# Patient Record
Sex: Female | Born: 1940 | Race: White | Hispanic: No | Smoking: Former smoker
Health system: Southern US, Community
[De-identification: ages and names within clinical notes are randomized; demographics above are authoritative.]

## PROBLEM LIST (undated history)

## (undated) DIAGNOSIS — M1990 Unspecified osteoarthritis, unspecified site: Secondary | ICD-10-CM

## (undated) DIAGNOSIS — F419 Anxiety disorder, unspecified: Secondary | ICD-10-CM

## (undated) DIAGNOSIS — H6990 Unspecified Eustachian tube disorder, unspecified ear: Secondary | ICD-10-CM

## (undated) DIAGNOSIS — F329 Major depressive disorder, single episode, unspecified: Secondary | ICD-10-CM

## (undated) DIAGNOSIS — D649 Anemia, unspecified: Secondary | ICD-10-CM

## (undated) DIAGNOSIS — Z9289 Personal history of other medical treatment: Secondary | ICD-10-CM

## (undated) DIAGNOSIS — R7309 Other abnormal glucose: Secondary | ICD-10-CM

## (undated) DIAGNOSIS — E039 Hypothyroidism, unspecified: Secondary | ICD-10-CM

## (undated) DIAGNOSIS — T7840XA Allergy, unspecified, initial encounter: Secondary | ICD-10-CM

## (undated) DIAGNOSIS — E785 Hyperlipidemia, unspecified: Secondary | ICD-10-CM

## (undated) DIAGNOSIS — Z860100 Personal history of colon polyps, unspecified: Secondary | ICD-10-CM

## (undated) DIAGNOSIS — H6980 Other specified disorders of Eustachian tube, unspecified ear: Secondary | ICD-10-CM

## (undated) DIAGNOSIS — F32A Depression, unspecified: Secondary | ICD-10-CM

## (undated) DIAGNOSIS — C801 Malignant (primary) neoplasm, unspecified: Secondary | ICD-10-CM

## (undated) DIAGNOSIS — I10 Essential (primary) hypertension: Secondary | ICD-10-CM

## (undated) DIAGNOSIS — K219 Gastro-esophageal reflux disease without esophagitis: Secondary | ICD-10-CM

## (undated) HISTORY — DX: Allergy, unspecified, initial encounter: T78.40XA

## (undated) HISTORY — DX: Unspecified eustachian tube disorder, unspecified ear: H69.90

## (undated) HISTORY — DX: Personal history of colonic polyps: Z86.010

## (undated) HISTORY — DX: Gastro-esophageal reflux disease without esophagitis: K21.9

## (undated) HISTORY — PX: APPENDECTOMY: SHX54

## (undated) HISTORY — DX: Personal history of other medical treatment: Z92.89

## (undated) HISTORY — PX: COLONOSCOPY W/ POLYPECTOMY: SHX1380

## (undated) HISTORY — PX: SINUS SURGERY WITH INSTATRAK: SHX5215

## (undated) HISTORY — PX: ABDOMINAL HYSTERECTOMY: SHX81

## (undated) HISTORY — DX: Other specified disorders of Eustachian tube, unspecified ear: H69.80

## (undated) HISTORY — PX: CHOLECYSTECTOMY: SHX55

## (undated) HISTORY — DX: Essential (primary) hypertension: I10

## (undated) HISTORY — PX: CATARACT EXTRACTION, BILATERAL: SHX1313

## (undated) HISTORY — DX: Anemia, unspecified: D64.9

## (undated) HISTORY — DX: Other abnormal glucose: R73.09

## (undated) HISTORY — PX: TUBAL LIGATION: SHX77

## (undated) HISTORY — PX: EXPLORATORY LAPAROTOMY: SUR591

## (undated) HISTORY — DX: Personal history of colon polyps, unspecified: Z86.0100

## (undated) HISTORY — PX: TOTAL SHOULDER REPLACEMENT: SUR1217

## (undated) HISTORY — DX: Hyperlipidemia, unspecified: E78.5

---

## 1997-12-06 ENCOUNTER — Ambulatory Visit (HOSPITAL_COMMUNITY): Admission: RE | Admit: 1997-12-06 | Discharge: 1997-12-06 | Payer: Self-pay | Admitting: Gastroenterology

## 1997-12-08 ENCOUNTER — Inpatient Hospital Stay (HOSPITAL_COMMUNITY): Admission: EM | Admit: 1997-12-08 | Discharge: 1997-12-16 | Payer: Self-pay | Admitting: Emergency Medicine

## 1999-08-02 ENCOUNTER — Encounter: Payer: Self-pay | Admitting: Cardiology

## 1999-08-27 ENCOUNTER — Ambulatory Visit (HOSPITAL_COMMUNITY): Admission: RE | Admit: 1999-08-27 | Discharge: 1999-08-27 | Payer: Self-pay | Admitting: Gastroenterology

## 1999-08-27 ENCOUNTER — Encounter: Payer: Self-pay | Admitting: Gastroenterology

## 1999-09-04 ENCOUNTER — Ambulatory Visit (HOSPITAL_COMMUNITY): Admission: RE | Admit: 1999-09-04 | Discharge: 1999-09-04 | Payer: Self-pay | Admitting: Gastroenterology

## 1999-09-04 ENCOUNTER — Encounter: Payer: Self-pay | Admitting: Gastroenterology

## 2000-02-04 ENCOUNTER — Ambulatory Visit (HOSPITAL_COMMUNITY): Admission: RE | Admit: 2000-02-04 | Discharge: 2000-02-04 | Payer: Self-pay | Admitting: Internal Medicine

## 2000-02-04 ENCOUNTER — Encounter: Payer: Self-pay | Admitting: Internal Medicine

## 2000-02-14 ENCOUNTER — Encounter: Admission: RE | Admit: 2000-02-14 | Discharge: 2000-05-14 | Payer: Self-pay

## 2000-07-17 ENCOUNTER — Ambulatory Visit (HOSPITAL_BASED_OUTPATIENT_CLINIC_OR_DEPARTMENT_OTHER): Admission: RE | Admit: 2000-07-17 | Discharge: 2000-07-17 | Payer: Self-pay | Admitting: Orthopedic Surgery

## 2000-11-27 ENCOUNTER — Encounter (INDEPENDENT_AMBULATORY_CARE_PROVIDER_SITE_OTHER): Payer: Self-pay | Admitting: Specialist

## 2000-11-27 ENCOUNTER — Other Ambulatory Visit: Admission: RE | Admit: 2000-11-27 | Discharge: 2000-11-27 | Payer: Self-pay | Admitting: Gastroenterology

## 2001-04-28 ENCOUNTER — Ambulatory Visit (HOSPITAL_BASED_OUTPATIENT_CLINIC_OR_DEPARTMENT_OTHER): Admission: RE | Admit: 2001-04-28 | Discharge: 2001-04-28 | Payer: Self-pay | Admitting: Orthopedic Surgery

## 2001-04-28 ENCOUNTER — Encounter (INDEPENDENT_AMBULATORY_CARE_PROVIDER_SITE_OTHER): Payer: Self-pay | Admitting: *Deleted

## 2001-08-18 ENCOUNTER — Encounter: Payer: Self-pay | Admitting: Sports Medicine

## 2001-08-18 ENCOUNTER — Encounter: Admission: RE | Admit: 2001-08-18 | Discharge: 2001-08-18 | Payer: Self-pay | Admitting: Sports Medicine

## 2005-03-10 ENCOUNTER — Ambulatory Visit: Payer: Self-pay | Admitting: Family Medicine

## 2005-06-12 ENCOUNTER — Ambulatory Visit: Payer: Self-pay | Admitting: Internal Medicine

## 2005-06-16 ENCOUNTER — Ambulatory Visit: Payer: Self-pay | Admitting: Cardiology

## 2006-05-20 ENCOUNTER — Ambulatory Visit: Payer: Self-pay | Admitting: Internal Medicine

## 2006-06-29 ENCOUNTER — Ambulatory Visit: Payer: Self-pay | Admitting: Internal Medicine

## 2006-06-29 LAB — CONVERTED CEMR LAB
ALT: 20 units/L (ref 0–40)
AST: 20 units/L (ref 0–37)
Basophils Relative: 0.7 % (ref 0.0–1.0)
HCT: 39.8 % (ref 36.0–46.0)
Lipase: 28 units/L (ref 11.0–59.0)
Lymphocytes Relative: 23.4 % (ref 12.0–46.0)
MCHC: 34.2 g/dL (ref 30.0–36.0)
MCV: 90 fL (ref 78.0–100.0)
RBC: 4.42 M/uL (ref 3.87–5.11)

## 2008-07-27 ENCOUNTER — Encounter: Payer: Self-pay | Admitting: Internal Medicine

## 2008-08-03 ENCOUNTER — Encounter: Payer: Self-pay | Admitting: Internal Medicine

## 2008-08-22 ENCOUNTER — Encounter: Payer: Self-pay | Admitting: Internal Medicine

## 2008-11-13 ENCOUNTER — Encounter: Payer: Self-pay | Admitting: Internal Medicine

## 2009-03-14 ENCOUNTER — Encounter: Payer: Self-pay | Admitting: Internal Medicine

## 2009-03-23 ENCOUNTER — Encounter: Payer: Self-pay | Admitting: Internal Medicine

## 2009-03-27 ENCOUNTER — Encounter: Payer: Self-pay | Admitting: Internal Medicine

## 2009-07-05 ENCOUNTER — Encounter: Payer: Self-pay | Admitting: Cardiology

## 2009-08-28 ENCOUNTER — Encounter: Payer: Self-pay | Admitting: Internal Medicine

## 2009-10-03 ENCOUNTER — Encounter: Payer: Self-pay | Admitting: Internal Medicine

## 2009-10-09 ENCOUNTER — Encounter: Payer: Self-pay | Admitting: Internal Medicine

## 2009-10-09 ENCOUNTER — Encounter: Payer: Self-pay | Admitting: Cardiology

## 2009-10-23 ENCOUNTER — Encounter: Admission: RE | Admit: 2009-10-23 | Discharge: 2009-10-23 | Payer: Self-pay | Admitting: Orthopedic Surgery

## 2009-10-29 ENCOUNTER — Ambulatory Visit: Payer: Self-pay | Admitting: Internal Medicine

## 2009-10-29 DIAGNOSIS — Z860100 Personal history of colon polyps, unspecified: Secondary | ICD-10-CM | POA: Insufficient documentation

## 2009-10-29 DIAGNOSIS — K219 Gastro-esophageal reflux disease without esophagitis: Secondary | ICD-10-CM | POA: Insufficient documentation

## 2009-10-29 DIAGNOSIS — Z87898 Personal history of other specified conditions: Secondary | ICD-10-CM

## 2009-10-29 DIAGNOSIS — I10 Essential (primary) hypertension: Secondary | ICD-10-CM

## 2009-10-29 DIAGNOSIS — Z8719 Personal history of other diseases of the digestive system: Secondary | ICD-10-CM | POA: Insufficient documentation

## 2009-10-29 DIAGNOSIS — D509 Iron deficiency anemia, unspecified: Secondary | ICD-10-CM | POA: Insufficient documentation

## 2009-11-16 ENCOUNTER — Encounter: Admission: RE | Admit: 2009-11-16 | Discharge: 2009-11-16 | Payer: Self-pay | Admitting: Internal Medicine

## 2009-11-21 ENCOUNTER — Encounter: Payer: Self-pay | Admitting: Cardiology

## 2009-11-29 ENCOUNTER — Encounter: Payer: Self-pay | Admitting: Cardiology

## 2009-12-21 ENCOUNTER — Encounter: Payer: Self-pay | Admitting: Cardiology

## 2010-01-16 ENCOUNTER — Ambulatory Visit: Payer: Self-pay | Admitting: Internal Medicine

## 2010-01-16 DIAGNOSIS — R0989 Other specified symptoms and signs involving the circulatory and respiratory systems: Secondary | ICD-10-CM

## 2010-01-16 DIAGNOSIS — R0609 Other forms of dyspnea: Secondary | ICD-10-CM | POA: Insufficient documentation

## 2010-01-17 ENCOUNTER — Telehealth (INDEPENDENT_AMBULATORY_CARE_PROVIDER_SITE_OTHER): Payer: Self-pay | Admitting: *Deleted

## 2010-01-17 ENCOUNTER — Encounter: Admission: RE | Admit: 2010-01-17 | Discharge: 2010-01-17 | Payer: Self-pay | Admitting: Orthopedic Surgery

## 2010-01-31 ENCOUNTER — Ambulatory Visit: Payer: Self-pay | Admitting: Internal Medicine

## 2010-02-11 ENCOUNTER — Telehealth (INDEPENDENT_AMBULATORY_CARE_PROVIDER_SITE_OTHER): Payer: Self-pay | Admitting: *Deleted

## 2010-02-12 ENCOUNTER — Encounter: Payer: Self-pay | Admitting: Cardiology

## 2010-02-13 ENCOUNTER — Telehealth: Payer: Self-pay | Admitting: Internal Medicine

## 2010-02-13 ENCOUNTER — Ambulatory Visit: Payer: Self-pay | Admitting: Critical Care Medicine

## 2010-02-15 ENCOUNTER — Encounter: Payer: Self-pay | Admitting: Internal Medicine

## 2010-03-05 ENCOUNTER — Telehealth (INDEPENDENT_AMBULATORY_CARE_PROVIDER_SITE_OTHER): Payer: Self-pay | Admitting: *Deleted

## 2010-03-08 ENCOUNTER — Ambulatory Visit: Payer: Self-pay | Admitting: Internal Medicine

## 2010-03-14 ENCOUNTER — Ambulatory Visit: Payer: Self-pay | Admitting: Internal Medicine

## 2010-03-14 DIAGNOSIS — J3089 Other allergic rhinitis: Secondary | ICD-10-CM

## 2010-03-14 DIAGNOSIS — J302 Other seasonal allergic rhinitis: Secondary | ICD-10-CM

## 2010-03-15 LAB — CONVERTED CEMR LAB
Basophils Relative: 0.5 % (ref 0.0–3.0)
Eosinophils Relative: 1.2 % (ref 0.0–5.0)
Hemoglobin: 13.2 g/dL (ref 12.0–15.0)
Monocytes Absolute: 0.8 10*3/uL (ref 0.1–1.0)
Neutro Abs: 5.6 10*3/uL (ref 1.4–7.7)
Platelets: 312 10*3/uL (ref 150.0–400.0)
RDW: 12.4 % (ref 11.5–14.6)

## 2010-04-15 ENCOUNTER — Encounter: Payer: Self-pay | Admitting: Internal Medicine

## 2010-04-15 ENCOUNTER — Telehealth (INDEPENDENT_AMBULATORY_CARE_PROVIDER_SITE_OTHER): Payer: Self-pay | Admitting: *Deleted

## 2010-04-19 ENCOUNTER — Telehealth (INDEPENDENT_AMBULATORY_CARE_PROVIDER_SITE_OTHER): Payer: Self-pay | Admitting: *Deleted

## 2010-05-06 ENCOUNTER — Ambulatory Visit: Payer: Self-pay | Admitting: Internal Medicine

## 2010-05-06 DIAGNOSIS — E785 Hyperlipidemia, unspecified: Secondary | ICD-10-CM | POA: Insufficient documentation

## 2010-05-06 DIAGNOSIS — R5383 Other fatigue: Secondary | ICD-10-CM

## 2010-05-06 DIAGNOSIS — E039 Hypothyroidism, unspecified: Secondary | ICD-10-CM | POA: Insufficient documentation

## 2010-05-06 DIAGNOSIS — R5381 Other malaise: Secondary | ICD-10-CM

## 2010-05-06 DIAGNOSIS — M19019 Primary osteoarthritis, unspecified shoulder: Secondary | ICD-10-CM | POA: Insufficient documentation

## 2010-05-13 ENCOUNTER — Telehealth: Payer: Self-pay | Admitting: Internal Medicine

## 2010-05-30 ENCOUNTER — Ambulatory Visit: Payer: Self-pay | Admitting: Internal Medicine

## 2010-05-30 HISTORY — PX: TOTAL SHOULDER REPLACEMENT: SUR1217

## 2010-05-31 ENCOUNTER — Ambulatory Visit: Payer: Self-pay | Admitting: Cardiology

## 2010-05-31 ENCOUNTER — Encounter: Payer: Self-pay | Admitting: Internal Medicine

## 2010-06-03 ENCOUNTER — Ambulatory Visit: Payer: Self-pay | Admitting: Internal Medicine

## 2010-06-03 DIAGNOSIS — M25519 Pain in unspecified shoulder: Secondary | ICD-10-CM

## 2010-06-03 DIAGNOSIS — R079 Chest pain, unspecified: Secondary | ICD-10-CM | POA: Insufficient documentation

## 2010-06-04 ENCOUNTER — Telehealth: Payer: Self-pay | Admitting: Cardiology

## 2010-06-05 ENCOUNTER — Inpatient Hospital Stay (HOSPITAL_COMMUNITY)
Admission: RE | Admit: 2010-06-05 | Discharge: 2010-06-06 | Payer: Self-pay | Source: Home / Self Care | Attending: Orthopedic Surgery | Admitting: Orthopedic Surgery

## 2010-06-17 ENCOUNTER — Telehealth (INDEPENDENT_AMBULATORY_CARE_PROVIDER_SITE_OTHER): Payer: Self-pay | Admitting: *Deleted

## 2010-07-05 ENCOUNTER — Telehealth (INDEPENDENT_AMBULATORY_CARE_PROVIDER_SITE_OTHER): Payer: Self-pay | Admitting: *Deleted

## 2010-07-19 ENCOUNTER — Other Ambulatory Visit: Payer: Self-pay | Admitting: Dermatology

## 2010-07-23 ENCOUNTER — Other Ambulatory Visit: Payer: Self-pay | Admitting: Internal Medicine

## 2010-07-23 ENCOUNTER — Ambulatory Visit
Admission: RE | Admit: 2010-07-23 | Discharge: 2010-07-23 | Payer: Self-pay | Source: Home / Self Care | Attending: Internal Medicine | Admitting: Internal Medicine

## 2010-07-28 LAB — CONVERTED CEMR LAB
Basophils Relative: 1.1 % (ref 0.0–3.0)
Eosinophils Absolute: 0.3 10*3/uL (ref 0.0–0.7)
Eosinophils Relative: 4 % (ref 0.0–5.0)
Hemoglobin: 12.9 g/dL (ref 12.0–15.0)
Lymphocytes Relative: 23.6 % (ref 12.0–46.0)
Lymphs Abs: 1.5 10*3/uL (ref 0.7–4.0)
MCV: 93.2 fL (ref 78.0–100.0)
RBC: 4.04 M/uL (ref 3.87–5.11)
RDW: 13.7 % (ref 11.5–14.6)

## 2010-08-01 NOTE — Progress Notes (Signed)
Summary: clearance order today if not surgery will be cancel 12-7   Phone Note From Other Clinic   Caller: [facility] office [phone]- [phone] head page Request: Talk with Nurse Details of Complaint: need clearance order today if not surgery will be cancel  for 12-7.  fax # [phone]. Initial call taken by: Lorne Skeens,  June 04, 2010 8:50 AM  Follow-up for Phone Call        Dr Antoine Poche saw pt 12/5 and cleared her note faxed, Cordelia Pen is aware Meredith Staggers, RN  June 04, 2010 9:04 AM

## 2010-08-01 NOTE — Letter (Signed)
Summary: [facility] Progress Note   [facility] Progress Note   Imported By: Roderic Ovens 06/18/2010 16:26:37  _____________________________________________________________________  External Attachment:    Type:   Image     Comment:   External Document

## 2010-08-01 NOTE — Assessment & Plan Note (Signed)
Summary: 6 weeks/ mbw   Primary Provider/Referring Provider:  Dr Alwyn Ren  CC:  6 wk. f/u-runny nose, starting to get better, and cough-slowed down productive at times.  History of Present Illness: January 31, 2010- 70 yoF seen at kind request of Dr Alwyn Ren, concerned about persistent cough.  She says this has been going on for 6-7 weeks. Hx chronic sinus complaints but no prior chest disease. Allergy skin tested years ago but allergy vaccine didn't seem to help. February 13, 2010 2:01 PMAcute OV  CDY patient.Pt seen by CDY 01/31/10  rx pred pulse and taper. see hx above and pt concurs is accurate.  After the prednisone was given the cough subsided significantly but still with persisten R ear pain.  Also notes ear feels full.  Sinuses are not stopped up.  There is no thick secretions seen.  There is less dyspnea. The pt completed course of prednisone.  No ABX given.    She remains off symbicort and singulair.  She was to have RAST assay with Dr Maple Hudson but not done due to EMR being down on 01/31/10. She has seen Dr Annalee Genta in the past of ENT.  March 14, 2010-  Seen by Dr Delford Field c/o pain R ear> Dr Lazarus Salines 8/19 > Cerumen removal didn't help. C/O if blows nose she feels "waves" in right ear. Original c/o cough is better- less deep and no longer daily or productive. In last 2-3 days more rhinorhea and postnasal drip, and still uncomfortable in right ear. Denies chest tightness, wheeze or dyspnea. No fever, no nasal congestion or discharge. Allergy profile- elevated cat (daughter has cats). Total IgE 5.5 Sed rate 39, EOS 1.2%     Preventive Screening-Counseling & Management  Alcohol-Tobacco     Smoking Status: quit     Packs/Day: <0.25     Year Started: 1964     Year Quit: 1972  Current Medications (verified): 1)  Synthroid 50 Mcg Tabs (Levothyroxine Sodium) .Marland Kitchen.. 1 By Mouth Once Daily 2)  Nexium 40 Mg Cpdr (Esomeprazole Magnesium) .Marland Kitchen.. 1 By Mouth Once Daily 3)  Verapamil Hcl Cr 240 Mg Xr24h-Cap  (Verapamil Hcl) .Marland Kitchen.. 1 By Mouth Once Daily 4)  Spironolactone-Hctz 25-25 Mg Tabs (Spironolactone-Hctz) .Marland Kitchen.. 1 By Mouth Once Daily 5)  Celebrex 200 Mg Caps (Celecoxib) .Marland Kitchen.. 1 By Mouth Once Daily 6)  Effexor Xr 150 Mg Xr24h-Cap (Venlafaxine Hcl) .Marland Kitchen.. 1 By Mouth Once Daily 7)  Metoprolol Succinate 50 Mg Xr24h-Tab (Metoprolol Succinate) .Marland Kitchen.. 1 By Mouth Once Daily 8)  Zegerid 40-1100 Mg Caps (Omeprazole-Sodium Bicarbonate) .Marland Kitchen.. 1 By Mouth At Bedtime 9)  Lipitor 20 Mg Tabs (Atorvastatin Calcium) .Marland Kitchen.. 1 By Mouth Once Daily 10)  Zolpidem Tartrate 10 Mg Tabs (Zolpidem Tartrate) .Marland Kitchen.. 1 By Mouth At Bedtime 11)  Centrum Silver  Tabs (Multiple Vitamins-Minerals) .Marland Kitchen.. 1 By Mouth Once Daily 12)  Fluticasone Propionate 50 Mcg/act Susp (Fluticasone Propionate) .... 2 Sprays Once Daily  Allergies (verified): 1)  ! Asa 2)  ! Percocet 3)  ! * Latex  Past History:  Past Surgical History: Last updated: 01/31/2010 Colon polypectomy 1999 R shoulder replacement  1009 Appendectomy Tubal ligation - 1973 Hysterectomy & BSO for uterine fibroid- 2001 Partial resection of duodenum post hemorrhage from  biopsy of duodenal nodule 2000 Cholecystectomy-1967 Sinus surgery - 1997 Right toe  Family History: Last updated: 01/31/2010 Father: CAD, renal cancer, DUD Mother: HH, mood swings, died MI Siblings: bo: MI @ 15  Social History: Last updated: 03/14/2010 Retired- Youth worker Married, lives w/ husband,  dtr, 4 cats Former Smoker: quit in college Alcohol use-yes:minimally  Risk Factors: Smoking Status: quit (03/14/2010) Packs/Day: <0.25 (03/14/2010)  Past Medical History: GERD with Hiatal Hernia; PMH of + H.pylori Hypertension Colonic polyps, hx of Anemia-iron deficiency, PMH of  Duodenal nodules  2000, Dr Russella Dar Recurrent rhinosinusitis Eustachian dysfunction latex allergy  Social History: Retired- Youth worker Married, lives w/ husband, dtr, 4 cats Former Smoker: quit in  college Alcohol use-yes:minimally  Review of Systems      See HPI       The patient complains of non-productive cough.  The patient denies shortness of breath with activity, shortness of breath at rest, productive cough, coughing up blood, chest pain, irregular heartbeats, acid heartburn, indigestion, loss of appetite, weight change, abdominal pain, difficulty swallowing, sore throat, tooth/dental problems, headaches, nasal congestion/difficulty breathing through nose, and sneezing.    Vital Signs:  Patient profile:   70 year old female Height:      63.5 inches Weight:      186.38 pounds O2 Sat:      97 % on Room air Pulse (ortho):   90 / minute BP sitting:   130 / 78  (left arm) Cuff size:   regular  Vitals Entered By: Reynaldo Minium CMA (March 14, 2010 10:27 AM)  O2 Flow:  Room air CC: 6 wk. f/u-runny nose,starting to get better,cough-slowed down productive at times Is Patient Diabetic? No Comments Medications reviewed with patient    Physical Exam  Additional Exam:  Gen: WF  in no distress , normal affect ENT:.    nares clear and no purulence seen, oropharynx is clear, TMS- clear and bright bilaterally, no cerumen, fluid or erythema. Neck: No JVD, no carotid bruits, thyroid not enlarged Lungs: No use of accessory muscles, no dullness to percussion, clear without rales or rhonchi. Minor dry cough once. Cardiovascular: RRR, heart sounds normal, no murmurs or gallops, no peripheral edema Abdomen: soft and non-tender, no HSM, BS normal Musculoskeletal: No deformities, no cyanosis or clubbing Neuro: alert, non-focal     Impression & Recommendations:  Problem # 1:  DYSPNEA/SHORTNESS OF BREATH (ICD-786.09)  Improved, with less cough, consistent with a resolving bronchitis. Her updated medication list for this problem includes:    Spironolactone-hctz 25-25 Mg Tabs (Spironolactone-hctz) .Marland Kitchen... 1 by mouth once daily    Metoprolol Succinate 50 Mg Xr24h-tab (Metoprolol  succinate) .Marland Kitchen... 1 by mouth once daily  Problem # 2:  ALLERGIC RHINITIS (ICD-477.9)  Rhinitis with eustachain dysfunction, aggravated by heavy exposure to her daughter's cats now that they live together. She had not improved with allergy vaccine elsewhere years ago. Doesn't tolerate Sudafed because of overstimulation. She can try phenylephrine and add a trial of Astepro.her in vitro IgE testing supports allergy to cats as a likely problem. She makes it clear that the cats aren't going away. use of air cleaners and keeping cats from her room will help to some degree. If necessary, we could make a vaccine, but I told her that more conservative measures should be tried first. Her updated medication list for this problem includes:    Fluticasone Propionate 50 Mcg/act Susp (Fluticasone propionate) .Marland Kitchen... 2 sprays once daily  Problem # 3:  CERUMEN IMPACTION, RIGHT (ICD-380.4) Resolved at present after ENT visit with Dr Lazarus Salines.  Other Orders: Est. Patient Level IV (04540) Flu Vaccine 76yrs + MEDICARE PATIENTS (J8119) Administration Flu vaccine - MCR (J4782)  Patient Instructions: 1)  Flu vax 2)  Please schedule a follow-up appointment in 2 months. 3)  Continue Flonase/ fluticasone 4)  Try adding sample Astepro nasal antihistamine spray: 5)  1-2 puffs twice daily as needed 6)  Consider the dust and allergy control information I gave you. 7)  Consider trying an otc decongestant phyenylephrine// Sudafed-PE occasionally if needed.  Flu Vaccine Consent Questions     Do you have a history of severe allergic reactions to this vaccine? no    Any prior history of allergic reactions to egg and/or gelatin? no    Do you have a sensitivity to the preservative Thimersol? no    Do you have a past history of Guillan-Barre Syndrome? no    Do you currently have an acute febrile illness? no    Have you ever had a severe reaction to latex? no    Vaccine information given and explained to patient? yes    Are you  currently pregnant? no    Lot Number:[id number]   Exp Date:12/28/2010   Site Given  Left Deltoid IMent-CCC]       .lbmedflu Reynaldo Minium CMA  March 14, 2010 4:59 PM

## 2010-08-01 NOTE — Progress Notes (Signed)
Summary: Generic Effexor  Phone Note From Pharmacy   Caller: [facility] [phone] Call For: ref # [id number]  Summary of Call: Patient mail order would like to know if generic Effexor can be given? Please advise. Initial call taken by: Lucious Groves CMA,  May 13, 2010 11:01 AM  Follow-up for Phone Call        yes; #90,RX1 Follow-up by: Marga Melnick MD,  May 13, 2010 12:42 PM  Additional Follow-up for Phone Call Additional follow up Details #1::        Advised generic ok. Additional Follow-up by: Lucious Groves CMA,  May 13, 2010 1:47 PM

## 2010-08-01 NOTE — Letter (Signed)
Summary: Doctor, general practice Medical Evaluations   Imported By: Lanelle Bal 11/13/2009 12:14:55  _____________________________________________________________________  External Attachment:    Type:   Image     Comment:   External Document

## 2010-08-01 NOTE — Procedures (Signed)
Summary: Colonoscopy/[facility]   Imported By: Lanelle Bal 11/02/2009 09:59:13  _____________________________________________________________________  External Attachment:    Type:   Image     Comment:   External Document

## 2010-08-01 NOTE — Progress Notes (Signed)
   Records received from [facility] gave to [place]. [facility]  June 17, 2010 8:39 AM

## 2010-08-01 NOTE — Assessment & Plan Note (Signed)
Summary: re-establish last seen 06-29-06//fd   Vital Signs:  Patient profile:   70 year old female Height:      62.75 inches Weight:      188.6 pounds BMI:     33.80 Temp:     97.6 degrees F oral Pulse rate:   60 / minute Resp:     14 per minute BP sitting:   124 / 78  (left arm) Cuff size:   large  Vitals Entered By: Shonna Chock (Oct 29, 2009 1:58 PM) CC: Re-Establish: Discuss meds and Arthritis   CC:  Re-Establish: Discuss meds and Arthritis.  History of Present Illness: Brenda Stephens has moved back to [facility] ; she is restablishing primary care here. 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 Colonoscopy & Endo negative then by Dr Alvester Morin , [facility] [place]. Records were reviewed.  Preventive Screening-Counseling & Management  Alcohol-Tobacco     Smoking Status: quit  Allergies (verified): 1)  ! Asa 2)  ! Percocet  Past History:  Past Medical History: GERD with Hiatal Hernia; PMH of + H.pylori Hypertension Colonic polyps, hx of Anemia-iron deficiency, PMH of  Duodenal nodules  2000, Dr Russella Dar  Past Surgical History: Colon polypectomy 1999 R shoulder replacement  1009 Appendectomy Hysterectomy & BSO for uterine fibroid Partial resection of duodenum post hemorrhage from  biopsy of duodenal nodule 2000  Family History: Father: CAD, renal cancer, DUD Mother: HH, mood swings Siblings: bo: MI @ 9  Social History: Retired Married Former Smoker: quit in college Alcohol use-yes:minimally Smoking Status:  quit  Review of Systems General:  Complains of sweats; denies chills, fever, and weight loss. ENT:  Denies difficulty swallowing and hoarseness. GI:  Complains of indigestion; denies abdominal pain, bloody stools, and dark tarry stools; Last abdominal pain was in 05/2009. GU:  Denies abnormal vaginal bleeding, discharge, dysuria, and hematuria.  Physical Exam  General:  well-nourished;  alert,appropriate and cooperative throughout examination Mouth:  Oral mucosa and oropharynx without lesions or exudates.  Teeth in good repair. No pharyngeal erythema.   Lungs:  Normal respiratory effort, chest expands symmetrically. Lungs are clear to auscultation, no crackles or wheezes. Heart:  Normal rate and regular rhythm. S1 and S2 normal without gallop, murmur, click, rub. S4 with slurring Abdomen:  Bowel sounds positive,abdomen soft and non-tender without masses, organomegaly or hernias noted. Mid line op scar Pulses:  R and L carotid,radial,dorsalis pedis and posterior tibial pulses are full and equal bilaterally Extremities:  No clubbing, cyanosis, edema.   Neurologic:  alert & oriented X3.   Skin:  Intact without suspicious lesions or rashes Cervical Nodes:  No lymphadenopathy noted Axillary Nodes:  No palpable lymphadenopathy Psych:  memory intact for recent and remote and normally interactive.     Impression & Recommendations:  Problem # 1:  UMBILICAL HERNIA, HX OF (ICD-V13.8) Not present on exam today  Problem # 2:  HYPERTENSION (ICD-401.9) controlled Her updated medication list for this problem includes:    Verapamil Hcl Cr 240 Mg Xr24h-cap (Verapamil hcl) .Marland Kitchen... 1 by mouth once daily    Spironolactone-hctz 25-25 Mg Tabs (Spironolactone-hctz) .Marland Kitchen... 1 by mouth once daily    Metoprolol Succinate 50 Mg Xr24h-tab (Metoprolol succinate) .Marland Kitchen... 1 by mouth once daily  Problem # 3:  ANEMIA-IRON DEFICIENCY (ICD-280.9) PMH of ; normal CBC & dif in 06/2009 in [place]  Problem # 4:  COLONIC POLYPS, HX OF (ICD-V12.72)  Problem # 5:  GERD (ICD-530.81)  Her updated medication list for this problem includes:    Nexium 40 Mg Pack (Esomeprazole magnesium) .Marland Kitchen... 1 by mouth once daily    Zegerid 40-1100 Mg Caps (Omeprazole-sodium bicarbonate) .Marland Kitchen... 1 by mouth at bedtime  Complete Medication List: 1)  Synthroid 50 Mcg Tabs (Levothyroxine sodium) .Marland Kitchen.. 1 by mouth once daily 2)  Nexium 40  Mg Pack (Esomeprazole magnesium) .Marland Kitchen.. 1 by mouth once daily 3)  Verapamil Hcl Cr 240 Mg Xr24h-cap (Verapamil hcl) .Marland Kitchen.. 1 by mouth once daily 4)  Spironolactone-hctz 25-25 Mg Tabs (Spironolactone-hctz) .Marland Kitchen.. 1 by mouth once daily 5)  Celebrex 200 Mg Caps (Celecoxib) .Marland Kitchen.. 1 by mouth once daily 6)  Effexor Xr 150 Mg Xr24h-cap (Venlafaxine hcl) .Marland Kitchen.. 1 by mouth once daily 7)  Metoprolol Succinate 50 Mg Xr24h-tab (Metoprolol succinate) .Marland Kitchen.. 1 by mouth once daily 8)  Zegerid 40-1100 Mg Caps (Omeprazole-sodium bicarbonate) .Marland Kitchen.. 1 by mouth at bedtime 9)  Lipitor 20 Mg Tabs (Atorvastatin calcium) .Marland Kitchen.. 1 by mouth once daily 10)  Zolpidem Tartrate 10 Mg Tabs (Zolpidem tartrate) .Marland Kitchen.. 1 by mouth at bedtime 11)  Centrum Silver Tabs (Multiple vitamins-minerals) .Marland Kitchen.. 1 by mouth once daily 12)  Neomycin-polymyxin-gramicidin Soln (Neomycin-polymyx-gramicid) .... 3 x daily (nasel spray) 13)  Refresh Dry Eye Therapy 1-1 % Soln (Glycerin-polysorbate 80) .... 2 x once daily  Patient Instructions: 1)  Consume LESS THAN 25 grams of sugar / day from foods & drinks with High Fructose Corn Syrup as #1,2 or #3 on label.

## 2010-08-01 NOTE — Letter (Signed)
Summary: [facility]  [facility]   Imported By: Lester Brevard 06/07/2010 10:50:14  _____________________________________________________________________  External Attachment:    Type:   Image     Comment:   External Document

## 2010-08-01 NOTE — Progress Notes (Signed)
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 Initial call taken by: Darletta Moll,  April 15, 2010 10:35 AM  Follow-up for Phone Call        LMTCBx1. I have printed a letter stateing pt had her flu shot. Carron Curie CMA  April 15, 2010 12:20 PM  [facility] x 2 Vernie Murders  April 19, 2010 11:42 AM   Additional Follow-up for Phone Call Additional follow up Details #1::        pt aware we will leave the letter and both rx's out front and she said she will p/u monday Additional Follow-up by: Philipp Deputy CMA,  April 19, 2010 4:17 PM    New/Updated Medications: ASTEPRO 0.15 % SOLN (AZELASTINE HCL) 1-2 sprays each nostril twice a day twice a day as needed Prescriptions: ASTEPRO 0.15 % SOLN (AZELASTINE HCL) 1-2 sprays each nostril twice a day twice a day as needed  #3 x 3   Entered by:   Philipp Deputy CMA   Authorized by:   Waymon Budge MD   Signed by:   Philipp Deputy CMA on 04/19/2010   Method used:   Print then Give to Patient   RxID:   [id number] FLUTICASONE PROPIONATE 50 MCG/ACT SUSP (FLUTICASONE PROPIONATE) 2 sprays once daily  #3 x 3   Entered by:   Philipp Deputy CMA   Authorized by:   Waymon Budge MD   Signed by:   Philipp Deputy CMA on 04/19/2010   Method used:   Print then Give to Patient   RxID:   [id number]

## 2010-08-01 NOTE — Consult Note (Signed)
Summary: [facility]  [facility]   Imported By: Lanelle Bal 02/25/2010 14:13:03  _____________________________________________________________________  External Attachment:    Type:   Image     Comment:   External Document

## 2010-08-01 NOTE — Progress Notes (Signed)
Summary: Lab results  Phone Note Outgoing Call Call back at Home Phone [phone]   Call placed by: Shonna Chock CMA,  January 17, 2010 4:41 PM Call placed to: Patient Summary of Call: Spoke with patient's husband Lorella Nimrod) and informed him of results below, he ok'd and will have patient call if no better by Monday  Complete blood count & Xrays normal. If symptoms no better with samples; PFTs & Stress Test indicated. Levester Fresh CMA  January 17, 2010 4:42 PM

## 2010-08-01 NOTE — Assessment & Plan Note (Signed)
Summary: recheck per pt//lch   Vital Signs:  Patient profile:   70 year old female Weight:      184.4 pounds BMI:     32.27 Temp:     98.4 degrees F oral Pulse rate:   72 / minute Resp:     15 per minute BP sitting:   140 / 80  (left arm) Cuff size:   regular  Vitals Entered By: Shonna Chock CMA (May 06, 2010 9:13 AM) CC: 1.) Sore throat and cough    2.) Renew meds , Fatigue, URI symptoms   Primary Care Provider:  Dr Alwyn Ren  CC:  1.) Sore throat and cough    2.) Renew meds , Fatigue, and URI symptoms.  History of Present Illness: Hyperlipidemia Follow-Up      This is a 70 year old woman who presents for Hyperlipidemia follow-up.  The patient reports chronic  fatigue, but denies muscle aches, GI upset, abdominal pain, flushing, itching, constipation, and diarrhea.  The patient denies the following symptoms: chest pain/pressure, exercise intolerance, dypsnea, palpitations, syncope, and pedal edema.  Compliance with medications (by patient report) has been near 100%.  Dietary compliance has been good.  The patient reports exercising 4-5 X per week as water aerobics.  Adjunctive measures currently used by the patient include fish oil supplements.   Hypertension Follow-Up      The patient also presents for Hypertension follow-up. The patient denies lightheadedness, urinary frequency, and headaches.  Compliance with medications (by patient report) has been near 100%.  Adjunctive measures currently used by the patient include salt restriction.  BP 120/ 70 @ home. Fatigue      The patient also presents with Fatigue.  The patient reports fatigue with moderate exertion. The patient denies fever, night sweats, weight loss, exertional chest pain, cough, and hemoptysis. The patient denies the following symptoms: orthopnea, PND, melena, adenopathy, severe snoring, daytime sleepiness, and skin changes.  She describes some depression; she is Effexor. The patient denies altered appetite and poor  sleep.  She is on Synthroid; TSH will be ordered. URI Symptoms      The patient also presents with URI symptoms since 11/2; onset as ST.  The patient reports purulent nasal discharge, sore throat, and intermittent  productive cough, but denies nasal congestion and earache.  The patient denies fever, dyspnea, and wheezing.  The patient also reports headache, bilateral nasal discharge and tooth pain.  The patient denies the following risk factors for Strep sinusitis: tender adenopathy.  Rx: throat spray & lozenges.                                                                                                   Note: L shoulder surgery planned in [facility] 05/17/2010.She was called because of cancellation.  Current Medications (verified): 1)  Synthroid 50 Mcg Tabs (Levothyroxine Sodium) .Marland Kitchen.. 1 By Mouth Once Daily 2)  Nexium 40 Mg Cpdr (Esomeprazole Magnesium) .Marland Kitchen.. 1 By Mouth Once Daily 3)  Verapamil Hcl Cr 240 Mg Xr24h-Cap (Verapamil Hcl) .Marland Kitchen.. 1 By Mouth Once Daily 4)  Spironolactone-Hctz 25-25 Mg Tabs (  Spironolactone-Hctz) .Marland Kitchen.. 1 By Mouth Once Daily 5)  Celebrex 200 Mg Caps (Celecoxib) .Marland Kitchen.. 1 By Mouth Once Daily 6)  Effexor Xr 150 Mg Xr24h-Cap (Venlafaxine Hcl) .Marland Kitchen.. 1 By Mouth Once Daily 7)  Metoprolol Succinate 50 Mg Xr24h-Tab (Metoprolol Succinate) .Marland Kitchen.. 1 By Mouth Once Daily 8)  Zegerid 40-1100 Mg Caps (Omeprazole-Sodium Bicarbonate) .Marland Kitchen.. 1 By Mouth At Bedtime 9)  Lipitor 20 Mg Tabs (Atorvastatin Calcium) .Marland Kitchen.. 1 By Mouth Once Daily 10)  Zolpidem Tartrate 10 Mg Tabs (Zolpidem Tartrate) .Marland Kitchen.. 1 By Mouth At Bedtime 11)  Centrum Silver  Tabs (Multiple Vitamins-Minerals) .Marland Kitchen.. 1 By Mouth Once Daily 12)  Fluticasone Propionate 50 Mcg/act Susp (Fluticasone Propionate) .... 2 Sprays Once Daily 13)  Astepro 0.15 % Soln (Azelastine Hcl) .Marland Kitchen.. 1-2 Sprays Each Nostril Twice A Day Twice A Day As Needed 14)  Pain Relief 500 Mg Tabs (Acetaminophen) .... 6 Per Day 15)  Throat Discs  Lozg (Throat Lozenges) .... As  Needed (3-4 X Daily)  Allergies: 1)  ! Asa 2)  ! Percocet 3)  ! * Latex  Review of Systems ENT:  Denies difficulty swallowing. Derm:  Denies changes in nail beds and hair loss. Neuro:  Denies numbness and tingling. Endo:  Denies cold intolerance and heat intolerance. Heme:  Denies abnormal bruising.  Physical Exam  General:  well-nourished,in no acute distress; alert,appropriate and cooperative throughout examination Ears:  External ear exam shows no significant lesions or deformities.  Otoscopic examination reveals clear canals, tympanic membranes are intact bilaterally without bulging, retraction, inflammation or discharge. Hearing is grossly normal bilaterally. Nose:  External nasal examination shows no deformity or inflammation. Nasal mucosa are  dry without lesions or exudates. Mouth:  Oral mucosa and oropharynx without lesions or exudates.  Teeth in good repair. Neck:  No deformities, masses, or tenderness noted. Lungs:  Normal respiratory effort, chest expands symmetrically. Lungs are clear to auscultation, no crackles or wheezes. Heart:  Normal rate and regular rhythm. S1 and S2 normal without gallop, murmur, click, rub or other extra sounds.S 4 Pulses:  R and L carotid,radial,dorsalis pedis and posterior tibial pulses are full and equal bilaterally Extremities:  No clubbing, cyanosis, edema. Decreased ROM knees & L shoulder. Pain with ROM L shoulder Neurologic:  alert & oriented X3 and DTRs symmetrical and normal.   Skin:  Intact without suspicious lesions or rashes Cervical Nodes:  No lymphadenopathy noted Axillary Nodes:  No palpable lymphadenopathy Psych:  memory intact for recent and remote, normally interactive, and good eye contact.     Impression & Recommendations:  Problem # 1:  SINUSITIS- ACUTE-NOS (ICD-461.9) Rapid Strep : negative Her updated medication list for this problem includes:    Fluticasone Propionate 50 Mcg/act Susp (Fluticasone propionate) .Marland Kitchen... 2  sprays once daily    Astepro 0.15 % Soln (Azelastine hcl) .Marland Kitchen... 1-2 sprays each nostril twice a day twice a day as needed    Amoxicillin 500 Mg Caps (Amoxicillin) .Marland Kitchen... 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 1 by mouth once daily  Problem # 4:  DYSLIPIDEMIA (ICD-272.4)  Her updated medication list for this problem includes:    Lipitor 20 Mg Tabs (Atorvastatin calcium) .Marland Kitchen... 1 by mouth once daily  Problem # 5:  HYPERTENSION (ICD-401.9)  Her updated medication  list for this problem includes:    Verapamil Hcl Cr 240 Mg Xr24h-cap (Verapamil hcl) .Marland Kitchen... 1 by mouth once daily    Spironolactone-hctz 25-25 Mg Tabs (Spironolactone-hctz) .Marland Kitchen... 1 by mouth once daily    Metoprolol Succinate 50 Mg Xr24h-tab (Metoprolol succinate) .Marland Kitchen... 1 by mouth once daily  Problem # 6:  FATIGUE, CHRONIC (ICD-780.79)  Complete Medication List: 1)  Synthroid 50 Mcg Tabs (Levothyroxine sodium) .Marland Kitchen.. 1 by mouth once daily 2)  Nexium 40 Mg Cpdr (Esomeprazole magnesium) .Marland Kitchen.. 1 by mouth once daily 3)  Verapamil Hcl Cr 240 Mg Xr24h-cap (Verapamil hcl) .Marland Kitchen.. 1 by mouth once daily 4)  Spironolactone-hctz 25-25 Mg Tabs (Spironolactone-hctz) .Marland Kitchen.. 1 by mouth once daily 5)  Celebrex 200 Mg Caps (Celecoxib) .Marland Kitchen.. 1 by mouth once daily 6)  Effexor Xr 150 Mg Xr24h-cap (Venlafaxine hcl) .Marland Kitchen.. 1 by mouth once daily 7)  Metoprolol Succinate 50 Mg Xr24h-tab (Metoprolol succinate) .Marland Kitchen.. 1 by mouth once daily 8)  Zegerid 40-1100 Mg Caps (Omeprazole-sodium bicarbonate) .Marland Kitchen.. 1 by mouth at bedtime 9)  Lipitor 20 Mg Tabs (Atorvastatin calcium) .Marland Kitchen.. 1 by mouth once daily 10)  Zolpidem Tartrate 10 Mg Tabs (Zolpidem tartrate) .Marland Kitchen.. 1 by mouth at bedtime 11)  Centrum Silver Tabs (Multiple  vitamins-minerals) .Marland Kitchen.. 1 by mouth once daily 12)  Fluticasone Propionate 50 Mcg/act Susp (Fluticasone propionate) .... 2 sprays once daily 13)  Astepro 0.15 % Soln (Azelastine hcl) .Marland Kitchen.. 1-2 sprays each nostril twice a day twice a day as needed 14)  Pain Relief 500 Mg Tabs (Acetaminophen) .... 6 per day 15)  Throat Discs Lozg (Throat lozenges) .... As needed (3-4 x daily) 16)  Amoxicillin 500 Mg Caps (Amoxicillin) .Marland Kitchen.. 1 three times a day  Other Orders: Rapid Strep (04540)  Patient Instructions: 1)  Drink as much NON dairy  fluid as you can tolerate for the next few days. Infection must be eradicated before surgery as discussed. Prescriptions: ZOLPIDEM TARTRATE 10 MG TABS (ZOLPIDEM TARTRATE) 1 by mouth at bedtime  #30 x 2   Entered and Authorized by:   Marga Melnick MD   Signed by:   Marga Melnick MD on 05/06/2010   Method used:   Print then Give to Patient   RxID:   [id number] NEXIUM 40 MG CPDR (ESOMEPRAZOLE MAGNESIUM) 1 by mouth once daily  #90 x 1   Entered and Authorized by:   Marga Melnick MD   Signed by:   Marga Melnick MD on 05/06/2010   Method used:   Print then Give to Patient   RxID:   [id number] METOPROLOL SUCCINATE 50 MG XR24H-TAB (METOPROLOL SUCCINATE) 1 by mouth once daily  #90 x 1   Entered and Authorized by:   Marga Melnick MD   Signed by:   Marga Melnick MD on 05/06/2010   Method used:   Print then Give to Patient   RxID:   [id number] EFFEXOR XR 150 MG XR24H-CAP (VENLAFAXINE HCL) 1 by mouth once daily  #90 x 1   Entered and Authorized by:   Marga Melnick MD   Signed by:   Marga Melnick MD on 05/06/2010   Method used:   Print then Give to Patient   RxID:   [id number] SPIRONOLACTONE-HCTZ 25-25 MG TABS (SPIRONOLACTONE-HCTZ) 1 by mouth once daily  #90 x 1   Entered and Authorized by:   Marga Melnick MD   Signed by:   Marga Melnick MD on 05/06/2010   Method used:   Print then Give to Patient   RxID:  [id number] SYNTHROID 50  MCG TABS (LEVOTHYROXINE SODIUM) 1 by mouth once daily  #90 x 1   Entered and Authorized by:   Marga Melnick MD   Signed by:   Marga Melnick MD on 05/06/2010   Method used:   Print then Give to Patient   RxID:   [id number] AMOXICILLIN 500 MG CAPS (AMOXICILLIN) 1 three times a day  #30 x 0   Entered and Authorized by:   Marga Melnick MD   Signed by:   Marga Melnick MD on 05/06/2010   Method used:   Faxed to ...       [facility]* (retail)       [address]       [place], [place]  [id number]       Ph: [phone]       Fax: [phone]   RxID:   [id number]    Orders Added: 1)  Rapid Strep [84166] 2)  Est. Patient Level IV [06301] 3)  Prescription Created Electronically [phone]

## 2010-08-01 NOTE — Assessment & Plan Note (Signed)
Summary: np6/ surgery clearance total shoulder this wed. pt has railro...      Allergies Added:   Visit Type:  Follow-up Referring Provider:  Dr. Mckinley Jewel Primary Provider:  Dr Alwyn Ren  CC:  Pre Op.  History of Present Illness: The patient presents for preoperative evaluation. She has a history of chest discomfort and cardiovascular risk factors. She has not had any prior cardiac history herself other than chest pain this summer. She was in [place]. She said she did not have a heart attack and was hospitalized overnight. She reports a stress echocardiogram which was negative for any abnormalities. I do not have this report. She is now being considered for left shoulder replacement next week. She said that she has had no recent chest pain. She is quite active. She does water aerobics at a vigorous level and she has no chest pressure, neck or arm discomfort. She has no palpitations, presyncope or syncope. She has no shortness of breath, PND or orthopnea. She has had no weight gain or edema.  Current Medications (verified): 1)  Synthroid 50 Mcg Tabs (Levothyroxine Sodium) .Marland Kitchen.. 1 By Mouth Once Daily 2)  Nexium 40 Mg Cpdr (Esomeprazole Magnesium) .Marland Kitchen.. 1 By Mouth Once Daily 3)  Verapamil Hcl Cr 240 Mg Xr24h-Cap (Verapamil Hcl) .Marland Kitchen.. 1 By Mouth Once Daily 4)  Spironolactone-Hctz 25-25 Mg Tabs (Spironolactone-Hctz) .Marland Kitchen.. 1 By Mouth Once Daily 5)  Celebrex 200 Mg Caps (Celecoxib) .Marland Kitchen.. 1 By Mouth Once Daily 6)  Effexor Xr 150 Mg Xr24h-Cap (Venlafaxine Hcl) .Marland Kitchen.. 1 By Mouth Once Daily 7)  Metoprolol Succinate 50 Mg Xr24h-Tab (Metoprolol Succinate) .Marland Kitchen.. 1 By Mouth Once Daily 8)  Zegerid 40-1100 Mg Caps (Omeprazole-Sodium Bicarbonate) .Marland Kitchen.. 1 By Mouth At Bedtime 9)  Lipitor 20 Mg Tabs (Atorvastatin Calcium) .Marland Kitchen.. 1 By Mouth Once Daily 10)  Zolpidem Tartrate 10 Mg Tabs (Zolpidem Tartrate) .Marland Kitchen.. 1 By Mouth At Bedtime 11)  Centrum Silver  Tabs (Multiple Vitamins-Minerals) .Marland Kitchen.. 1 By Mouth Once Daily 12)   Fluticasone Propionate 50 Mcg/act Susp (Fluticasone Propionate) .... 2 Sprays Once Daily 13)  Astepro 0.15 % Soln (Azelastine Hcl) .Marland Kitchen.. 1-2 Sprays Each Nostril Twice A Day Twice A Day As Needed 14)  Pain Relief 500 Mg Tabs (Acetaminophen) .... 6 Per Day 15)  Throat Discs  Lozg (Throat Lozenges) .... As Needed (3-4 X Daily)  Allergies (verified): 1)  ! Asa 2)  ! Percocet 3)  ! * Latex  Past History:  Past Medical History: GERD with Hiatal Hernia; PMH of + H.pylori Hypertension Colonic polyps, hx of Anemia-iron deficiency, PMH of  Duodenal nodules  2000, Dr Russella Dar Recurrent rhinosinusitis Eustachian dysfunction Latex allergy Dyslipidemia  Past Surgical History: Reviewed history from 01/31/2010 and no changes required. Colon polypectomy 1999 R shoulder replacement  1009 Appendectomy Tubal ligation - 1973 Hysterectomy & BSO for uterine fibroid- 2001 Partial resection of duodenum post hemorrhage from  biopsy of duodenal nodule 2000 Cholecystectomy-1967 Sinus surgery - 1997 Right toe  Family History: Father: CAD, renal cancer, DUD Mother: HH, mood swings, died MI Siblings: bo: MI @ 25 (Her brother died suddenly at home and it was no autopsy. I cannot confirm myocardial infarction.)  Review of Systems       Positive for joint pains, mild ankle edema, recent sinusitis. Otherwise as stated in the history of present illness negative for all other systems.  Vital Signs:  Patient profile:   70 year old female Height:      63.5 inches Weight:  184 pounds BMI:     32.20 Pulse rate:   66 / minute Resp:     18 per minute BP sitting:   142 / 78  (right arm)  Vitals Entered By: Marrion Coy, CNA (May 31, 2010 1:48 PM)  Physical Exam  General:  Well developed, well nourished, in no acute distress. Head:  normocephalic and atraumatic Eyes:  PERRLA/EOM intact; conjunctiva and lids normal. Mouth:  Teeth, gums and palate normal. Oral mucosa normal. Neck:  Neck supple,  no JVD. No masses, thyromegaly or abnormal cervical nodes. Chest Wall:  no deformities or breast masses noted Lungs:  Clear bilaterally to auscultation and percussion. Heart:  Non-displaced PMI, chest non-tender; regular rate and rhythm, S1, S2 without murmurs, rubs or gallops. Carotid upstroke normal, no bruit. Normal abdominal aortic size, no bruits. Femorals normal pulses, no bruits. Pedals normal pulses. No edema, no varicosities. Abdomen:  Bowel sounds positive; abdomen soft and non-tender without masses, organomegaly, or hernias noted. No hepatosplenomegaly. Msk:  Back normal, normal gait. Muscle strength and tone normal. Extremities:  No clubbing or cyanosis. Neurologic:  Alert and oriented x 3. Skin:  Intact without lesions or rashes. Cervical Nodes:  no significant adenopathy Axillary Nodes:  no significant adenopathy Inguinal Nodes:  no significant adenopathy Psych:  Normal affect.   Impression & Recommendations:  Problem # 1:  PREOPERATIVE EXAMINATION (ICD-V72.84) The patient has had no new symptoms. She has a high functional level. She is going for procedure that is moderate risk of death from a cardiovascular standpoint. Therefore, according to ACC/AHA guidelines the patient is an acceptable risk for the planned procedure. No further cardiovascular testing is suggested.  Problem # 2:  HYPERTENSION (ICD-401.9) Her blood pressure is controlled and she will continue the meds as listed.  Problem # 3:  DYSLIPIDEMIA (ICD-272.4) This is followed by her primary physician. She is due to have repeat labs early next week and I will be happy to review these.  Patient Instructions: 1)  Your physician recommends that you schedule a follow-up appointment as needed 2)  Your physician recommends that you continue on your current medications as directed. Please refer to the Current Medication list given to you today.

## 2010-08-01 NOTE — Assessment & Plan Note (Signed)
Summary: ROV 2 MONTHS///KP   Primary Brenda Stephens/Referring Brenda Stephens:  Dr Alwyn Ren  CC:  2 month follow up visit-still having heaviness on hcest with activiity. Denies any wheezing.Brenda Stephens  History of Present Illness: February 13, 2010 2:01 PMAcute OV  CDY patient.Pt seen by CDY 01/31/10  rx pred pulse and taper. see hx above and pt concurs is accurate.  After the prednisone was given the cough subsided significantly but still with persisten R ear pain.  Also notes ear feels full.  Sinuses are not stopped up.  There is no thick secretions seen.  There is less dyspnea. The pt completed course of prednisone.  No ABX given.    She remains off symbicort and singulair.  She was to have RAST assay with Dr Maple Hudson but not done due to EMR being down on 01/31/10. She has seen Dr Annalee Genta in the past of ENT.  March 14, 2010-  Seen by Dr Delford Field c/o pain R ear> Dr Lazarus Salines 8/19 > Cerumen removal didn't help. C/O if blows nose she feels "waves" in right ear. Original c/o cough is better- less deep and no longer daily or productive. In last 2-3 days more rhinorhea and postnasal drip, and still uncomfortable in right ear. Denies chest tightness, wheeze or dyspnea. No fever, no nasal congestion or discharge. Allergy profile- elevated cat (daughter has cats). Total IgE 5.5 Sed rate 39, EOS 1.2%  May 30, 2010- Cough/ allergy Nurse-CC: 2 month follow up visit-still having heaviness on chest with activiity. Denies any wheezing. Pending left shoulder replacement next week by Dr Eulah Pont. She continues to notice substernal heaviness that was not clarified by cardiac testing in the Spring, done in [place], including treadmill stress test and echocardiogram. . Occasional dry cough. Denies wheeze, palpitation, dysphagia or dyspnea.     Preventive Screening-Counseling & Management  Alcohol-Tobacco     Smoking Status: quit     Packs/Day: <0.25     Year Started: 1964     Year Quit: 1972  Current Medications (verified): 1)   Synthroid 50 Mcg Tabs (Levothyroxine Sodium) .Brenda Stephens.. 1 By Mouth Once Daily 2)  Nexium 40 Mg Cpdr (Esomeprazole Magnesium) .Brenda Stephens.. 1 By Mouth Once Daily 3)  Verapamil Hcl Cr 240 Mg Xr24h-Cap (Verapamil Hcl) .Brenda Stephens.. 1 By Mouth Once Daily 4)  Spironolactone-Hctz 25-25 Mg Tabs (Spironolactone-Hctz) .Brenda Stephens.. 1 By Mouth Once Daily 5)  Celebrex 200 Mg Caps (Celecoxib) .Brenda Stephens.. 1 By Mouth Once Daily 6)  Effexor Xr 150 Mg Xr24h-Cap (Venlafaxine Hcl) .Brenda Stephens.. 1 By Mouth Once Daily 7)  Metoprolol Succinate 50 Mg Xr24h-Tab (Metoprolol Succinate) .Brenda Stephens.. 1 By Mouth Once Daily 8)  Zegerid 40-1100 Mg Caps (Omeprazole-Sodium Bicarbonate) .Brenda Stephens.. 1 By Mouth At Bedtime 9)  Lipitor 20 Mg Tabs (Atorvastatin Calcium) .Brenda Stephens.. 1 By Mouth Once Daily 10)  Zolpidem Tartrate 10 Mg Tabs (Zolpidem Tartrate) .Brenda Stephens.. 1 By Mouth At Bedtime 11)  Centrum Silver  Tabs (Multiple Vitamins-Minerals) .Brenda Stephens.. 1 By Mouth Once Daily 12)  Fluticasone Propionate 50 Mcg/act Susp (Fluticasone Propionate) .... 2 Sprays Once Daily 13)  Astepro 0.15 % Soln (Azelastine Hcl) .Brenda Stephens.. 1-2 Sprays Each Nostril Twice A Day Twice A Day As Needed 14)  Pain Relief 500 Mg Tabs (Acetaminophen) .... 6 Per Day 15)  Throat Discs  Lozg (Throat Lozenges) .... As Needed (3-4 X Daily)  Allergies (verified): 1)  ! Asa 2)  ! Percocet 3)  ! * Latex  Past History:  Past Medical History: Last updated: 03/14/2010 GERD with Hiatal Hernia; PMH of + H.pylori Hypertension Colonic  polyps, hx of Anemia-iron deficiency, PMH of  Duodenal nodules  2000, Dr Russella Dar Recurrent rhinosinusitis Eustachian dysfunction latex allergy  Past Surgical History: Last updated: 01/31/2010 Colon polypectomy 1999 R shoulder replacement  1009 Appendectomy Tubal ligation - 1973 Hysterectomy & BSO for uterine fibroid- 2001 Partial resection of duodenum post hemorrhage from  biopsy of duodenal nodule 2000 Cholecystectomy-1967 Sinus surgery - 1997 Right toe  Family History: Last updated: 01/31/2010 Father: CAD,  renal cancer, DUD Mother: HH, mood swings, died MI Siblings: bo: MI @ 24  Social History: Last updated: 03/14/2010 Retired- Youth worker Married, lives w/ husband, dtr, 4 cats Former Smoker: quit in college Alcohol use-yes:minimally  Risk Factors: Smoking Status: quit (05/30/2010) Packs/Day: <0.25 (05/30/2010)  Review of Systems      See HPI  The patient denies shortness of breath with activity, shortness of breath at rest, productive cough, non-productive cough, coughing up blood, chest pain, irregular heartbeats, acid heartburn, indigestion, loss of appetite, weight change, abdominal pain, difficulty swallowing, sore throat, tooth/dental problems, headaches, nasal congestion/difficulty breathing through nose, sneezing, itching, ear ache, anxiety, rash, change in color of mucus, and fever.    Vital Signs:  Patient profile:   70 year old female Height:      63.5 inches Weight:      184.38 pounds BMI:     32.27 O2 Sat:      97 % on Room air Pulse rate:   86 / minute BP sitting:   136 / 86  (left arm) Cuff size:   regular  Vitals Entered By: Reynaldo Minium CMA (May 30, 2010 1:37 PM)  O2 Flow:  Room air CC: 2 month follow up visit-still having heaviness on hcest with activiity. Denies any wheezing.   Physical Exam  Additional Exam:  Gen: WF  in no distress , normal affect ENT:.    nares clear and no purulence seen, oropharynx is clear, TMS- clear and bright bilaterally, no cerumen, fluid or erythema. Neck: No JVD, no carotid bruits, thyroid not enlarged Lungs: No use of accessory muscles, no dullness to percussion, clear without rales or rhonchi. Cardiovascular: RRR, heart sounds normal, no murmurs or gallops, no peripheral edema Abdomen: soft and non-tender, no HSM, BS normal Musculoskeletal: No deformities, no cyanosis or clubbing Neuro: alert, non-focal, unremarkable to observation.     Impression & Recommendations:  Problem # 1:  DYSPNEA/SHORTNESS OF  BREATH (ICD-786.09)  She describes a vey nonspecific sense of chest heaviness, more with stepping outdoors than with exertion. It might relect some temperature related bronchospasm. Apparently Dr Alwyn Ren has not been able to obtain results of the cardiac workup done in [place], but she doesn't understand that it showed any problem. From a pulmonary standpoint she is clear and I don't see any restriction or undue risk associated with her planned shoulder surgery.  Her updated medication list for this problem includes:    Spironolactone-hctz 25-25 Mg Tabs (Spironolactone-hctz) .Brenda Stephens... 1 by mouth once daily    Metoprolol Succinate 50 Mg Xr24h-tab (Metoprolol succinate) .Brenda Stephens... 1 by mouth once daily  Other Orders: Est. Patient Level III (12751)  Patient Instructions: 1)  Please schedule a follow-up appointment as needed.

## 2010-08-01 NOTE — Letter (Signed)
Summary: [facility]  [facility]   Imported By: Marylou Mccoy 06/07/2010 16:51:41  _____________________________________________________________________  External Attachment:    Type:   Image     Comment:   External Document

## 2010-08-01 NOTE — Progress Notes (Signed)
Summary: Refill Request  Phone Note Refill Request Call back at [phone] Message from:  Pharmacy on July 05, 2010 8:31 AM  Refills Requested: Medication #1:  SYNTHROID 50 MCG TABS 1 by mouth once daily   Dosage confirmed as above?Dosage Confirmed   Supply Requested: 3 months   Last Refilled: 05/06/2010 [facility]  Next Appointment Scheduled: none Initial call taken by: Harold Barban,  July 05, 2010 8:32 AM  Follow-up for Phone Call        Left message on machine for patient to return call when avaliable, Reason for call:  discuss refill, rx was given  04/2010 and ? when patient last had thyroid checked Follow-up by: Shonna Chock CMA,  July 05, 2010 2:00 PM  Additional Follow-up for Phone Call Additional follow up Details #1::        Left message on machine for patient to return call when avaliable, Reason for call:   Discuss Refill Additional Follow-up by: Shonna Chock CMA,  July 10, 2010 8:56 AM    Additional Follow-up for Phone Call Additional follow up Details #2::    Spoke with patient, schedule appointment to check Thyroid level on 07/23/2009. We will wait to see what labs show prior to refilling med for 90 day supply Follow-up by: Shonna Chock CMA,  July 11, 2010 2:52 PM

## 2010-08-01 NOTE — Progress Notes (Signed)
Summary: Refill Request  Phone Note Refill Request Call back at Home Phone [phone] Message from:  Brenda Stephens  Refills Requested: Medication #1:  EFFEXOR XR 150 MG XR24H-CAP 1 by mouth once daily Needs 30 day supply for [facility], long Term rx sent to [facility]. Going out of town on Tuesday Brenda Stephens with pending appointment 05/06/2010  Initial call taken by: Shonna Chock CMA,  April 19, 2010 4:03 PM  Follow-up for Phone Call        I spoke with Brenda Stephens's husband and informed him that Brenda Stephens last seen x 1 year or longer and we will give a mailorder rx at her next appointment in 04/2010. Information ok'd Follow-up by: Shonna Chock CMA,  April 19, 2010 4:06 PM    Prescriptions: EFFEXOR XR 150 MG XR24H-CAP (VENLAFAXINE HCL) 1 by mouth once daily  #30 x 0   Entered by:   Shonna Chock CMA   Authorized by:   Marga Melnick MD   Signed by:   Shonna Chock CMA on 04/19/2010   Method used:   Electronically to        [facility]* (retail)       [address]       [place], [place]  [id number]       Ph: [phone]       Fax: [phone]   RxID:   [id number]

## 2010-08-01 NOTE — Letter (Signed)
Summary: [facility]  [facility]   Imported By: Marylou Mccoy 07/09/2010 10:14:09  _____________________________________________________________________  External Attachment:    Type:   Image     Comment:   External Document

## 2010-08-01 NOTE — Assessment & Plan Note (Signed)
Summary: Acute Pulmonary OV   Primary Provider/Referring Provider:  Dr Alwyn Ren  CC:  Acute Visit.  Pt of Dr. Maple Hudson.  c/o of right cheek and ear pain and congestion in throat since Thurday.  Finished pred taper this past Saturday.  Marland Kitchen  History of Present Illness: January 31, 2010- 70 yoF seen at kind request of Dr Alwyn Ren, concerned about persistent cough.  She says this has been going on for 6-7 weeks. [place] [facility] [place] They moved into a home which was being occupied by daughter with 4 cats. No initial problem. At the end of May she went to [place]. While there she was acutely ill with bad, productive  cough, tiredness, sinus pressure. She was treated there with serial antibiotics including amoxacillin, levaquin, doxycycline, and dx'd with viral bronchitis. She returned to Dr Alwyn Ren and put on Symbicort and singulair July 20,2011. Cough continues to produce thick yellow mucus, but cough is now less deep. It is worst in the morning till mid-afternoon, not at night. She feels pressure soreness behind left eye withut change in vison. Right  ear blocks and hurts. Feels exhausted. Just restarted water aerobics this week. Heaviness in chest climbing steps, but no wheeze, palpitation, chest pain, dyspnea at rest, fever, chills, rash or nodes. Dis have some chilling early in the illness, and antibiotics gave some GI upset.  Hx chronic sinus complaints but no prior chest disease. Allergy skin tested years ago but allergy vaccine didn't seem to help.  February 13, 2010 2:01 PM Acute OV  CDY patient. Pt seen by CDY 01/31/10  rx pred pulse and taper. see hx above and pt concurs is accurate.  After the prednisone was given the cough subsided significantly but still with persisten R ear pain.  Also notes ear feels full.  Sinuses are not stopped up.  There is no thick secretions seen.  There is less dyspnea. The pt completed course of prednisone.  No ABX given.    She remains off symbicort and  singulair.  She was to have RAST assay with Dr Maple Hudson but not done due to EMR being down on 01/31/10. She has seen Dr Annalee Genta in the past of ENT.    Preventive Screening-Counseling & Management  Alcohol-Tobacco     Smoking Status: quit     Packs/Day: <0.25     Year Started: 1964     Year Quit: 1972  Current Medications (verified): 1)  Synthroid 50 Mcg Tabs (Levothyroxine Sodium) .Marland Kitchen.. 1 By Mouth Once Daily 2)  Nexium 40 Mg Pack (Esomeprazole Magnesium) .Marland Kitchen.. 1 By Mouth Once Daily 3)  Verapamil Hcl Cr 240 Mg Xr24h-Cap (Verapamil Hcl) .Marland Kitchen.. 1 By Mouth Once Daily 4)  Spironolactone-Hctz 25-25 Mg Tabs (Spironolactone-Hctz) .Marland Kitchen.. 1 By Mouth Once Daily 5)  Celebrex 200 Mg Caps (Celecoxib) .Marland Kitchen.. 1 By Mouth Once Daily 6)  Effexor Xr 150 Mg Xr24h-Cap (Venlafaxine Hcl) .Marland Kitchen.. 1 By Mouth Once Daily 7)  Metoprolol Succinate 50 Mg Xr24h-Tab (Metoprolol Succinate) .Marland Kitchen.. 1 By Mouth Once Daily 8)  Zegerid 40-1100 Mg Caps (Omeprazole-Sodium Bicarbonate) .Marland Kitchen.. 1 By Mouth At Bedtime 9)  Lipitor 20 Mg Tabs (Atorvastatin Calcium) .Marland Kitchen.. 1 By Mouth Once Daily 10)  Zolpidem Tartrate 10 Mg Tabs (Zolpidem Tartrate) .Marland Kitchen.. 1 By Mouth At Bedtime 11)  Centrum Silver  Tabs (Multiple Vitamins-Minerals) .Marland Kitchen.. 1 By Mouth Once Daily 12)  Fluticasone Propionate 50 Mcg/act Susp (Fluticasone Propionate) .... 2 Sprays Once Daily  Allergies (verified): 1)  ! Asa 2)  !  Percocet 3)  ! * Latex  Past History:  Past medical, surgical, family and social histories (including risk factors) reviewed, and no changes noted (except as noted below).  Past Medical History: Reviewed history from 01/31/2010 and no changes required. GERD with Hiatal Hernia; PMH of + H.pylori Hypertension Colonic polyps, hx of Anemia-iron deficiency, PMH of  Duodenal nodules  2000, Dr Russella Dar Recurrent rhinosinusitis latex allergy  Past Surgical History: Reviewed history from 01/31/2010 and no changes required. Colon polypectomy 1999 R shoulder replacement   1009 Appendectomy Tubal ligation - 1973 Hysterectomy & BSO for uterine fibroid- 2001 Partial resection of duodenum post hemorrhage from  biopsy of duodenal nodule 2000 Cholecystectomy-1967 Sinus surgery - 1997 Right toe  Family History: Reviewed history from 01/31/2010 and no changes required. Father: CAD, renal cancer, DUD Mother: HH, mood swings, died MI Siblings: bo: MI @ 85  Social History: Reviewed history from 01/31/2010 and no changes required. Retired- Youth worker Married, lives w/ husband, dtr Former Smoker: quit in college Alcohol use-yes:minimally  Review of Systems       The patient complains of shortness of breath with activity.  The patient denies shortness of breath at rest, productive cough, non-productive cough, coughing up blood, chest pain, irregular heartbeats, acid heartburn, indigestion, loss of appetite, weight change, abdominal pain, difficulty swallowing, sore throat, tooth/dental problems, headaches, nasal congestion/difficulty breathing through nose, sneezing, itching, ear ache, anxiety, depression, hand/feet swelling, joint stiffness or pain, rash, change in color of mucus, and fever.    Vital Signs:  Patient profile:   70 year old female Height:      63.5 inches Weight:      195.38 pounds BMI:     34.19 O2 Sat:      98 % on Room air Temp:     97.6 degrees F oral Pulse rate:   58 / minute BP sitting:   132 / 82  (left arm) Cuff size:   regular  Vitals Entered By: Gweneth Dimitri RN (February 13, 2010 1:52 PM)  O2 Flow:  Room air CC: Acute Visit.  Pt of Dr. Maple Hudson.  c/o of right cheek and ear pain and congestion in throat since Thurday.  Finished pred taper this past Saturday.   Comments Medications reviewed with patient Daytime contact number verified with patient. Gweneth Dimitri RN  February 13, 2010 1:54 PM    Physical Exam  Additional Exam:  Gen: WF  in no distress , normal affect ENT:R ear canal narrow and tortuous, upon inspection  of the ear drum a thick cerumen pack is adherent to tympanic membrane with surrounding external canal inflammation/erythema.    nares is clear and no purulence seen, oropharynx is clear Neck: No JVD, no TMG, no carotid bruits Lungs: No use of accessory muscles, no dullness to percussion, clear without rales or rhonchi Cardiovascular: RRR, heart sounds normal, no murmurs or gallops, no peripheral edema Abdomen: soft and non-tender, no HSM, BS normal Musculoskeletal: No deformities, no cyanosis or clubbing Neuro: alert, non-focal     Impression & Recommendations:  Problem # 1:  CERUMEN IMPACTION, RIGHT (ICD-380.4) Assessment Unchanged R ear cerumen impaction,   external ear canal inflammation,  I did not have the necessary equipment to remove this plan refer to Dr Mayer Camel al. of [facility] for eval and treatment  Orders: Est. Patient Level III (13086) ENT Referral (ENT)  Problem # 2:  RHINOSINUSITIS, ACUTE (ICD-461.8) Assessment: Improved Rhinitis is better plan  stay on flonase daily pt to return  to f/u with dr young for further allergy evaluation Her updated medication list for this problem includes:    Fluticasone Propionate 50 Mcg/act Susp (Fluticasone propionate) .Marland Kitchen... 2 sprays once daily  Orders: Est. Patient Level III (91478) ENT Referral (ENT)  Complete Medication List: 1)  Synthroid 50 Mcg Tabs (Levothyroxine sodium) .Marland Kitchen.. 1 by mouth once daily 2)  Nexium 40 Mg Pack (Esomeprazole magnesium) .Marland Kitchen.. 1 by mouth once daily 3)  Verapamil Hcl Cr 240 Mg Xr24h-cap (Verapamil hcl) .Marland Kitchen.. 1 by mouth once daily 4)  Spironolactone-hctz 25-25 Mg Tabs (Spironolactone-hctz) .Marland Kitchen.. 1 by mouth once daily 5)  Celebrex 200 Mg Caps (Celecoxib) .Marland Kitchen.. 1 by mouth once daily 6)  Effexor Xr 150 Mg Xr24h-cap (Venlafaxine hcl) .Marland Kitchen.. 1 by mouth once daily 7)  Metoprolol Succinate 50 Mg Xr24h-tab (Metoprolol succinate) .Marland Kitchen.. 1 by mouth once daily 8)  Zegerid 40-1100 Mg Caps (Omeprazole-sodium  bicarbonate) .Marland Kitchen.. 1 by mouth at bedtime 9)  Lipitor 20 Mg Tabs (Atorvastatin calcium) .Marland Kitchen.. 1 by mouth once daily 10)  Zolpidem Tartrate 10 Mg Tabs (Zolpidem tartrate) .Marland Kitchen.. 1 by mouth at bedtime 11)  Centrum Silver Tabs (Multiple vitamins-minerals) .Marland Kitchen.. 1 by mouth once daily 12)  Fluticasone Propionate 50 Mcg/act Susp (Fluticasone propionate) .... 2 sprays once daily  Patient Instructions: 1)  You need an appt with Dr Mayer Camel al asap for evaluation of the R ear pain and cerumen impaction 2)  Stay on flonase daily 3)  Stay off symbicort and singulair 4)  I will ask Dr Maple Hudson which lab you will need 5)  Return per dr young, he will call   Appended Document: Acute Pulmonary OV fax [facility]

## 2010-08-01 NOTE — Consult Note (Signed)
Summary: [facility]  [facility]   Imported By: Lanelle Bal 09/06/2009 10:43:38  _____________________________________________________________________  External Attachment:    Type:   Image     Comment:   External Document

## 2010-08-01 NOTE — Assessment & Plan Note (Signed)
Summary: congested/mbw   Primary Provider/Referring Provider:  Dr Alwyn Ren  CC:  Pulmonary Consult-Increased cough and no better-Dr. Alwyn Ren..  History of Present Illness: January 31, 2010- 70 yoF seen at kind request of Dr Alwyn Ren, concerned about persistent cough.  She says this has been going on for 6-7 weeks. [place] [facility] [place] They moved into a home which was being occupied by daughter with 4 cats. No initial problem. At the end of May she went to [place]. While there she was acutely ill with bad, productive  cough, tiredness, sinus pressure. She was treated there with serial antibiotics including amoxacillin, levaquin, doxycycline, and dx'd with viral bronchitis. She returned to Dr Alwyn Ren and put on Symbicort and singulair July 20,2011. Cough continues to produce thick yellow mucus, but cough is now less deep. It is worst in the morning till mid-afternoon, not at night. She feels pressure soreness behind left eye withut change in vison. Left ear blocks and hurts. Feels exhausted. Just restarted water aerobics this week. Heaviness in chest climbing steps, but no wheeze, palpitation, chest pain, dyspnea at rest, fever, chills, rash or nodes. Dis have some chilling early in the illness, and antibiotics gave some GI upset.  Hx chronic sinus complaints but no prior chest disease. Allergy skin tested years ago but allergy vaccine didn't seem to help.  Preventive Screening-Counseling & Management  Alcohol-Tobacco     Smoking Status: quit     Packs/Day: <0.25     Year Started: 1963     Year Quit: 1972  Current Medications (verified): 1)  Synthroid 50 Mcg Tabs (Levothyroxine Sodium) .Marland Kitchen.. 1 By Mouth Once Daily 2)  Nexium 40 Mg Pack (Esomeprazole Magnesium) .Marland Kitchen.. 1 By Mouth Once Daily 3)  Verapamil Hcl Cr 240 Mg Xr24h-Cap (Verapamil Hcl) .Marland Kitchen.. 1 By Mouth Once Daily 4)  Spironolactone-Hctz 25-25 Mg Tabs (Spironolactone-Hctz) .Marland Kitchen.. 1 By Mouth Once Daily 5)  Celebrex 200 Mg Caps  (Celecoxib) .Marland Kitchen.. 1 By Mouth Once Daily 6)  Effexor Xr 150 Mg Xr24h-Cap (Venlafaxine Hcl) .Marland Kitchen.. 1 By Mouth Once Daily 7)  Metoprolol Succinate 50 Mg Xr24h-Tab (Metoprolol Succinate) .Marland Kitchen.. 1 By Mouth Once Daily 8)  Zegerid 40-1100 Mg Caps (Omeprazole-Sodium Bicarbonate) .Marland Kitchen.. 1 By Mouth At Bedtime 9)  Lipitor 20 Mg Tabs (Atorvastatin Calcium) .Marland Kitchen.. 1 By Mouth Once Daily 10)  Zolpidem Tartrate 10 Mg Tabs (Zolpidem Tartrate) .Marland Kitchen.. 1 By Mouth At Bedtime 11)  Centrum Silver  Tabs (Multiple Vitamins-Minerals) .Marland Kitchen.. 1 By Mouth Once Daily 12)  Fluticasone Propionate 50 Mcg/act Susp (Fluticasone Propionate) .... 2 Sprays Once Daily 13)  Symbicort 80-4.5 Mcg/act Aero (Budesonide-Formoterol Fumarate) .Marland Kitchen.. 1 Puff Every 12 Hrs; Gargle & Spit After Use 14)  Singulair 10 Mg Tabs (Montelukast Sodium) .Marland Kitchen.. 1 Once Daily  Allergies (verified): 1)  ! Asa 2)  ! Percocet 3)  ! * Latex  Past History:  Family History: Last updated: 01/31/2010 Father: CAD, renal cancer, DUD Mother: HH, mood swings, died MI Siblings: bo: MI @ 49  Social History: Last updated: 01/31/2010 Retired- Youth worker Married, lives w/ husband, dtr Former Smoker: quit in college Alcohol use-yes:minimally  Risk Factors: Smoking Status: quit (01/31/2010) Packs/Day: <0.25 (01/31/2010)  Past Medical History: GERD with Hiatal Hernia; PMH of + H.pylori Hypertension Colonic polyps, hx of Anemia-iron deficiency, PMH of  Duodenal nodules  2000, Dr Russella Dar Recurrent rhinosinusitis latex allergy  Past Surgical History: Colon polypectomy 1999 R shoulder replacement  1009 Appendectomy Tubal ligation - 1973 Hysterectomy & BSO for uterine fibroid-  2001 Partial resection of duodenum post hemorrhage from  biopsy of duodenal nodule 2000 Cholecystectomy-1967 Sinus surgery - 1997 Right toe  Family History: Father: CAD, renal cancer, DUD Mother: HH, mood swings, died MI Siblings: bo: MI @ 7  Social History: Retired- Geneticist, molecular Married, lives w/ husband, dtr Former Smoker: quit in college Alcohol use-yes:minimally Packs/Day:  <0.25  Review of Systems      See HPI       The patient complains of shortness of breath with activity, productive cough, acid heartburn, headaches, itching, ear ache, depression, joint stiffness or pain, and change in color of mucus.  The patient denies shortness of breath at rest, non-productive cough, coughing up blood, chest pain, irregular heartbeats, indigestion, loss of appetite, weight change, abdominal pain, difficulty swallowing, sore throat, tooth/dental problems, nasal congestion/difficulty breathing through nose, sneezing, anxiety, hand/feet swelling, rash, and fever.    Vital Signs:  Patient profile:   70 year old female Height:      62.75 inches Weight:      187 pounds BMI:     33.51 O2 Sat:      96 % on Room air Pulse rate:   86 / minute BP sitting:   124 / 86  (right arm) Cuff size:   regular  Vitals Entered By: Reynaldo Minium CMA (January 31, 2010 2:23 PM)  O2 Flow:  Room air CC: Pulmonary Consult-Increased cough and no better-Dr. Alwyn Ren.   Physical Exam  Additional Exam:  General: A/Ox3; pleasant and cooperative, NAD, SKIN: no rash, lesions NODES: no lymphadenopathy HEENT: [place]/AT, EOM- WNL, Conjuctivae- clear, PERRLA, TM-deep/ hard to visualize, no fluid seen, Nose- clear, Throat- clear and wnl, not red, no drainage or hoarseness, Mallampati  III NECK: Supple w/ fair ROM, JVD- none, normal carotid impulses w/o bruits Thyroid- normal to palpation CHEST: Clear to P&A, no cough, wheeze, rales, or dullness. HEART: RRR, no m/g/r heard ABDOMEN: Soft and nl; nml bowel sounds; no organomegaly or masses noted EAV:WUJW, nl pulses, no edema, cyanosis or clubbing NEURO: Grossly intact to observation       Impression & Recommendations:  Problem # 1:  RHINOSINUSITIS, ACUTE (ICD-461.8)  Despite negative sinus xray and repeated treatment with antibiotics, this  would seem the best explanation for her left ear and retro-orbital discomfort. Sinus CT may be appropriate. Barotrauma from airflight or a herpes infection of eustachian tube are less likely. I taught Neti pot. We will rule out significant allergic component, suggested by past atopic hx and current exposure to cats.  Plan- Intended labs for sed rate, allergy profile, cbc w/ diff. However computer went down, so we agreed to see how she did with prednisone first. Labs can be drawn on revisit, once prednisone clears, if warranted. Consider CT sinus. Her updated medication list for this problem includes:    Fluticasone Propionate 50 Mcg/act Susp (Fluticasone propionate) .Marland Kitchen... 2 sprays once daily  Problem # 2:  BRONCHITIS-ACUTE (ICD-466.0)  Tracheobronchits, probably linked to her rhinosinusitis. Since current meds don't seem to be helping, I gave permission to set Symbicort and Singulair aside while we try a steroid burst and reassess.  Her updated medication list for this problem includes:    Symbicort 80-4.5 Mcg/act Aero (Budesonide-formoterol fumarate) .Marland Kitchen... 1 puff every 12 hrs; gargle & spit after use    Singulair 10 Mg Tabs (Montelukast sodium) .Marland Kitchen... 1 once daily  Orders: Consultation Level IV (11914)  Medications Added to Medication List This Visit: 1)  Prednisone 10 Mg Tabs (Prednisone) .Marland KitchenMarland KitchenMarland Kitchen  1 tab four times daily x 2 days, 3 times daily x 2 days, 2 times daily x 2 days, 1 time daily x 2 days  Patient Instructions: 1)  Schedule return in 4-6 weeks. Call sooner if needed. 2)  Computer was down today so labs were not ordered and will be reconsidered as appropriate later. 3)  Script for prednisone taper handwritten. Prescriptions: PREDNISONE 10 MG TABS (PREDNISONE) 1 tab four times daily x 2 days, 3 times daily x 2 days, 2 times daily x 2 days, 1 time daily x 2 days  #20 x 0   Entered and Authorized by:   Waymon Budge MD   Signed by:   Waymon Budge MD on 02/03/2010   Method used:    Handwritten   RxID:   [id number]

## 2010-08-01 NOTE — Progress Notes (Signed)
Summary: lab appt---LMOMTCB  Phone Note Call from Patient Call back at Home Phone [phone]   Caller: Patient Call For: young Summary of Call: States that CY was supposed to schedule her for labs, pls advise. Initial call taken by: Darletta Moll,  March 05, 2010 1:41 PM  Follow-up for Phone Call        called and spoke with pt.  pt states when she last saw CY on 01/31/2010, he had recommended labs to be drawn but computers were down (see patient instructions from that OV stating this)  pt wanted to know if she needs to go ahead and have labs drawn or wait until her pending appt with CY on 03/14/2010.  Will forward message to CY to address.  Aundra Millet Reynolds LPN  March 05, 2010 1:49 PM   Additional Follow-up for Phone Call Additional follow up Details #1::        I have ordered labs. Please have her come by to get these drawn. She has appointment to see me Sept 15. If drawn soon, results could be reviewed on her return visit. Additional Follow-up by: Waymon Budge MD,  March 06, 2010 9:41 AM    Additional Follow-up for Phone Call Additional follow up Details #2::    ATC x3. Line busy. WCB later.Michel Bickers CMA  March 06, 2010 10:50 AM  ATC x2. Line busy. WCB.Michel Bickers [facility]  March 06, 2010 11:33 AM ATC x1. [employer]. WCB.Michel Bickers CMA  March 06, 2010 12:21 PM   Spoke with Cay Schillings trouble with phone lines in pts area; was told to continue to try calling pt later. Unsure of when problem will be resolved.Reynaldo Minium CMA  March 06, 2010 1:26 PM   ATCx1 line busy. WCB. Carron Curie CMA  March 06, 2010 3:23 PM   Additional Follow-up for Phone Call Additional follow up Details #3:: Details for Additional Follow-up Action Taken: [facility]. Pjone is working again.Michel Bickers [facility]  March 07, 2010 10:52 AM  Spoke with pt and notified okay to have labs done per Dr Maple Hudson.  She will come tommorrow am to the lab.  Orders were placed in  EMR. Additional Follow-up by: Vernie Murders,  March 07, 2010 2:49 PM

## 2010-08-01 NOTE — Letter (Signed)
Summary: [facility]  [facility]   Imported By: Marylou Mccoy 07/09/2010 10:21:57  _____________________________________________________________________  External Attachment:    Type:   Image     Comment:   External Document

## 2010-08-01 NOTE — Letter (Signed)
Summary: [facility]  [facility]   Imported By: Marylou Mccoy 07/09/2010 10:09:48  _____________________________________________________________________  External Attachment:    Type:   Image     Comment:   External Document

## 2010-08-01 NOTE — Assessment & Plan Note (Signed)
Summary: FOR SURGICAL CLEARANCE//PH   Vital Signs:  Patient profile:   70 year old female Height:      63 inches Weight:      182 pounds BMI:     32.36 Temp:     98.8 degrees F oral Pulse rate:   76 / minute Resp:     16 per minute BP sitting:   140 / 86  (left arm) Cuff size:   large  Vitals Entered By: Shonna Chock CMA (June 03, 2010 11:24 AM) CC: Surgical clearance: Patient with pending left shoulder replacement surgery for Wed 06/05/10. Patient already cleared through cardiology and pulmonary, Pre-op Evaluation, Shoulder pain   Primary Care Provider:  Dr Alwyn Ren  CC:  Surgical clearance: Patient with pending left shoulder replacement surgery for Wed 06/05/10. Patient already cleared through cardiology and pulmonary, Pre-op Evaluation, and Shoulder pain.  History of Present Illness: Pre-Op Evaluation; requested by Dr Greig Right office.       The patient denies respiratory symptoms ( see Pulmonary evaluation by Dr Maple Hudson), GI  symptoms, chest pain ( see 12/02/2011[facility] evaluation by Dr Antoine Poche ), edema, and PND.  See complete ROS for verification.The patient reports numbness, weakness, tingling, locking, stiffness, and impaired ROM, but denies swelling and redness.  The pain is located in the left shoulder.  The pain began without injury > 12 months ago.  The patient describes the pain as intermittent & variable : sharp, dull, burning, and aching @ times.  The pain was previously  better with ice, not now. No response to to steroid injection .  The pain is worse with activity, specifically elevation.    Current Medications (verified): 1)  Synthroid 50 Mcg Tabs (Levothyroxine Sodium) .Marland Kitchen.. 1 By Mouth Once Daily 2)  Nexium 40 Mg Cpdr (Esomeprazole Magnesium) .Marland Kitchen.. 1 By Mouth Once Daily 3)  Verapamil Hcl Cr 240 Mg Xr24h-Cap (Verapamil Hcl) .Marland Kitchen.. 1 By Mouth Once Daily 4)  Spironolactone-Hctz 25-25 Mg Tabs (Spironolactone-Hctz) .Marland Kitchen.. 1 By Mouth Once Daily 5)  Celebrex 200 Mg Caps  (Celecoxib) .Marland Kitchen.. 1 By Mouth Once Daily 6)  Effexor Xr 150 Mg Xr24h-Cap (Venlafaxine Hcl) .Marland Kitchen.. 1 By Mouth Once Daily 7)  Metoprolol Succinate 50 Mg Xr24h-Tab (Metoprolol Succinate) .Marland Kitchen.. 1 By Mouth Once Daily 8)  Zegerid 40-1100 Mg Caps (Omeprazole-Sodium Bicarbonate) .Marland Kitchen.. 1 By Mouth At Bedtime 9)  Lipitor 20 Mg Tabs (Atorvastatin Calcium) .Marland Kitchen.. 1 By Mouth Once Daily 10)  Zolpidem Tartrate 10 Mg Tabs (Zolpidem Tartrate) .Marland Kitchen.. 1 By Mouth At Bedtime 11)  Centrum Silver  Tabs (Multiple Vitamins-Minerals) .Marland Kitchen.. 1 By Mouth Once Daily 12)  Fluticasone Propionate 50 Mcg/act Susp (Fluticasone Propionate) .... 2 Sprays Once Daily 13)  Astepro 0.15 % Soln (Azelastine Hcl) .Marland Kitchen.. 1-2 Sprays Each Nostril Twice A Day Twice A Day As Needed 14)  Pain Relief 500 Mg Tabs (Acetaminophen) .... 6 Per Day 15)  Throat Discs  Lozg (Throat Lozenges) .... As Needed (3-4 X Daily)  Allergies: 1)  ! Asa 2)  ! Percocet 3)  ! * Latex  Review of Systems General:  Denies chills, fever, sweats, and weight loss. CV:  Denies chest pain or discomfort, difficulty breathing at night, difficulty breathing while lying down, leg cramps with exertion, and palpitations. Resp:  Denies cough, coughing up blood, shortness of breath, sputum productive, and wheezing. GI:  Denies abdominal pain, bloody stools, dark tarry stools, and indigestion. GU:  Denies discharge, dysuria, and hematuria.  Physical Exam  General:  well-nourished,in no acute distress; alert,appropriate  and cooperative throughout examination Eyes:  No corneal or conjunctival inflammation noted. Slight ptosis. Perrla. Neck:  No deformities, masses, or tenderness noted. Lungs:  Normal respiratory effort, chest expands symmetrically. Lungs are clear to auscultation, no crackles or wheezes. Heart:  Normal rate and regular rhythm. S1  normal; S2 slurred  without gallop, murmur, click, rub or other extra sounds. Abdomen:  Bowel sounds positive,abdomen soft and non-tender without  masses, organomegaly or hernias noted. Pulses:  R and L carotid,radial,dorsalis pedis and posterior tibial pulses are full and equal bilaterally Extremities:  No clubbing, cyanosis, edema.Pain with  passive ROM LUE Neurologic:  alert & oriented X3 and DTRs symmetrical and normal.   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 1 by mouth once daily    Pain Relief 500 Mg Tabs (Acetaminophen) .Marland KitchenMarland KitchenMarland KitchenMarland Kitchen 6 per day  Problem # 2:  HYPERTENSION (ICD-401.9) adequate control Her updated medication list for this problem includes:    Verapamil Hcl Cr 240 Mg Xr24h-cap (Verapamil hcl) .Marland Kitchen... 1 by mouth once daily    Spironolactone-hctz 25-25 Mg Tabs (Spironolactone-hctz) .Marland Kitchen... 1 by mouth once daily    Metoprolol Succinate 50 Mg Xr24h-tab (Metoprolol succinate) .Marland Kitchen... 1 by mouth once daily  Problem # 3:  CHEST PAIN (ICD-786.50) 11/2009 in [place]; negative Cardiac evaluation 05/2010  Problem # 4:  DYSPNEA/SHORTNESS OF BREATH (ICD-786.09) stable,no contraindication to surgery Her updated medication list for this problem includes:    Spironolactone-hctz 25-25 Mg Tabs (Spironolactone-hctz) .Marland Kitchen... 1 by mouth once daily    Metoprolol Succinate 50 Mg Xr24h-tab (Metoprolol succinate) .Marland Kitchen... 1 by mouth once daily  Complete Medication List: 1)  Synthroid 50 Mcg Tabs (Levothyroxine sodium) .Marland Kitchen.. 1 by mouth once daily 2)  Nexium 40 Mg Cpdr (Esomeprazole magnesium) .Marland Kitchen.. 1 by mouth once daily 3)  Verapamil Hcl Cr 240 Mg Xr24h-cap (Verapamil hcl) .Marland Kitchen.. 1 by mouth once daily 4)  Spironolactone-hctz 25-25 Mg Tabs (Spironolactone-hctz) .Marland Kitchen.. 1 by mouth once daily 5)  Celebrex 200 Mg Caps (Celecoxib) .Marland Kitchen.. 1 by mouth once daily 6)   Effexor Xr 150 Mg Xr24h-cap (Venlafaxine hcl) .Marland Kitchen.. 1 by mouth once daily 7)  Metoprolol Succinate 50 Mg Xr24h-tab (Metoprolol succinate) .Marland Kitchen.. 1 by mouth once daily 8)  Zegerid 40-1100 Mg Caps (Omeprazole-sodium bicarbonate) .Marland Kitchen.. 1 by mouth at bedtime 9)  Lipitor 20 Mg Tabs (Atorvastatin calcium) .Marland Kitchen.. 1 by mouth once daily 10)  Zolpidem Tartrate 10 Mg Tabs (Zolpidem tartrate) .Marland Kitchen.. 1 by mouth at bedtime 11)  Centrum Silver Tabs (Multiple vitamins-minerals) .Marland Kitchen.. 1 by mouth once daily 12)  Fluticasone Propionate 50 Mcg/act Susp (Fluticasone propionate) .... 2 sprays once daily 13)  Astepro 0.15 % Soln (Azelastine hcl) .Marland Kitchen.. 1-2 sprays each nostril twice a day twice a day as needed 14)  Pain Relief 500 Mg Tabs (Acetaminophen) .... 6 per day 15)  Throat Discs Lozg (Throat lozenges) .... As needed (3-4 x daily)  Patient Instructions: 1)  I have reviewed  recent extensive Cardiopulmonary evaluations. There is no medical contraindication to this surgery which necessary for advanced degenerative disease of shoulder.   Orders Added: 1)  Est. Patient Level III [16109]

## 2010-08-01 NOTE — Letter (Signed)
Summary: [facility]  [address]   [place], [place] [address]   Phone: [phone]  Fax: [phone]    04/15/2010  Brenda Stephens [address] [place], [place]  [address]  To Whom it May Concern,      The above named patient had her flu shot on 03-14-10. Please see the information below:   Flu Vaccine Consent Questions     Do you have a history of severe allergic reactions to this vaccine? no    Any prior history of allergic reactions to egg and/or gelatin? no    Do you have a sensitivity to the preservative Thimersol? no    Do you have a past history of Guillan-Barre Syndrome? no    Do you currently have an acute febrile illness? no    Have you ever had a severe reaction to latex? no    Vaccine information given and explained to patient? yes    Are you currently pregnant? no    Lot Number:[id number]   Exp Date:12/28/2010   Site Given  Left Deltoid [id number]] Reynaldo Minium CMA  March 14, 2010 4:59 PM   If you have any futher questions please contact our office at [phone].    Sincerely,   Dr. Jetty Duhamel

## 2010-08-01 NOTE — Progress Notes (Signed)
Summary: still in pain  Phone Note Call from Patient Call back at Home Phone [phone]   Caller: Patient Call For: young Reason for Call: Talk to Nurse Summary of Call: pt still having pain in right side of face and right ear.  pt was in 2 weeks ago, chest pain seems to have cleared some. Initial call taken by: Eugene Gavia,  February 11, 2010 2:23 PM  Follow-up for Phone Call        ATC pt-no answer and unable to leave message; try again later; should come in to be seen as CDY is out of the office this week.Reynaldo Minium CMA  February 11, 2010 2:57 PM    Spoke with pt; Right side of face and ear are still painful(constient). Breathing is better. Just finished Prednisone. No better-appt with PW at 2pm on Wednesday 02-13-2010.Reynaldo Minium CMA  February 11, 2010 4:25 PM

## 2010-08-01 NOTE — Letter (Signed)
Summary: [facility]   Imported By: Lanelle Bal 11/02/2009 09:54:50  _____________________________________________________________________  External Attachment:    Type:   Image     Comment:   External Document

## 2010-08-01 NOTE — Letter (Signed)
Summary: [facility]  [facility]   Imported By: Marylou Mccoy 06/07/2010 16:51:00  _____________________________________________________________________  External Attachment:    Type:   Image     Comment:   External Document

## 2010-08-01 NOTE — Letter (Signed)
Summary: [facility]  [facility]   Imported By: Lanelle Bal 10/26/2009 13:48:13  _____________________________________________________________________  External Attachment:    Type:   Image     Comment:   External Document

## 2010-08-01 NOTE — Progress Notes (Signed)
Summary: Multiple med refill---LM 8/17  Phone Note Refill Request Call back at Home Phone [phone] Message from:  Patient on February 13, 2010 10:11 AM  Refills Requested: Medication #1:  VERAPAMIL HCL CR 240 MG XR24H-CAP 1 by mouth once daily   Dosage confirmed as above?Dosage Confirmed   Supply Requested: 3 months  Medication #2:  EFFEXOR XR 150 MG XR24H-CAP 1 by mouth once daily   Dosage confirmed as above?Dosage Confirmed   Supply Requested: 3 months  Medication #3:  NEXIUM 40 MG PACK 1 by mouth once daily   Dosage confirmed as above?Dosage Confirmed   Supply Requested: 3 months  Medication #4:  ZEGERID 40-1100 MG CAPS 1 by mouth at bedtime   Dosage confirmed as above?Dosage Confirmed PT WILL COME BY TO PICK  UP RX'S ON FRIDAY  WANTS TO MAIL RX'S HERSELF   Method Requested: Pick up at Office Initial call taken by: Lavell Islam,  February 13, 2010 10:14 AM  Follow-up for Phone Call        Is this ok to refill, please advise. Lucious Groves CMA  February 13, 2010 10:20 AM   Additional Follow-up for Phone Call Additional follow up Details #1::        Per Dr. Alwyn Ren-- "3 months of all OK EXCEPT  EITHER Nexium OR Zegerid is usual maintenance med schedule & managed care will not cover both w/o GI  assessment for condition such as esophageal stricture or Barrett's Esophagus"   Left message on machine to call back to office. Lucious Groves CMA  February 13, 2010 2:59 PM     Additional Follow-up for Phone Call Additional follow up Details #2::    Spoke with patient and she insists that insurance will and has been covering both meds. Patient is not aware if she has the dx named above. I made patient aware that we are not used to both being covered b/c they are for the same condition, but will have prescriptions ready for pick up on Friday. Follow-up by: Lucious Groves CMA,  February 13, 2010 3:33 PM  Prescriptions: ZEGERID 40-1100 MG CAPS (OMEPRAZOLE-SODIUM BICARBONATE) 1 by mouth at bedtime   #90 x 1   Entered and Authorized by:   Marga Melnick MD   Signed by:   Marga Melnick MD on 02/14/2010   Method used:   Print then Give to Patient   RxID:   [id number] EFFEXOR XR 150 MG XR24H-CAP (VENLAFAXINE HCL) 1 by mouth once daily  #90 x 1   Entered and Authorized by:   Marga Melnick MD   Signed by:   Marga Melnick MD on 02/14/2010   Method used:   Print then Give to Patient   RxID:   [id number] VERAPAMIL HCL CR 240 MG XR24H-CAP (VERAPAMIL HCL) 1 by mouth once daily  #90 x 3   Entered and Authorized by:   Marga Melnick MD   Signed by:   Marga Melnick MD on 02/14/2010   Method used:   Print then Give to Patient   RxID:   [id number] NEXIUM 40 MG PACK (ESOMEPRAZOLE MAGNESIUM) 1 by mouth once daily  #90 x 1   Entered and Authorized by:   Marga Melnick MD   Signed by:   Marga Melnick MD on 02/14/2010   Method used:   Print then Give to Patient   RxID:   [id number] ZEGERID 40-1100 MG CAPS (OMEPRAZOLE-SODIUM BICARBONATE) 1 by mouth at bedtime  #90 x 3   Entered by:   Tresa Endo  Manson Passey CMA   Authorized by:   Marga Melnick MD   Signed by:   Lucious Groves CMA on 02/13/2010   Method used:   Print then Give to Patient   RxID:   [id number] EFFEXOR XR 150 MG XR24H-CAP (VENLAFAXINE HCL) 1 by mouth once daily  #90 x 3   Entered by:   Lucious Groves CMA   Authorized by:   Marga Melnick MD   Signed by:   Lucious Groves CMA on 02/13/2010   Method used:   Print then Give to Patient   RxID:   [id number] VERAPAMIL HCL CR 240 MG XR24H-CAP (VERAPAMIL HCL) 1 by mouth once daily  #90 x 3   Entered by:   Lucious Groves CMA   Authorized by:   Marga Melnick MD   Signed by:   Lucious Groves CMA on 02/13/2010   Method used:   Print then Give to Patient   RxID:   [id number] NEXIUM 40 MG PACK (ESOMEPRAZOLE MAGNESIUM) 1 by mouth once daily  #90 x 3   Entered by:   Lucious Groves CMA   Authorized by:   Marga Melnick MD   Signed by:   Lucious Groves CMA on 02/13/2010   Method  used:   Print then Give to Patient   RxID:   [id number]   Appended Document: Multiple med refill---LM 8/17 pharmacy called back ok to fill generic for zegid and capsule for nexium, per pharmacy this is how pt has received med in the pass, verbal ok given to pharmacy and med update on med list

## 2010-08-01 NOTE — Assessment & Plan Note (Signed)
Summary: BRONCHITIS AND DISCUSS PERSPIRATION/CDJ   Vital Signs:  Patient profile:   70 year old female Weight:      189.8 pounds Temp:     98.3 degrees F oral Pulse rate:   80 / minute Resp:     15 per minute BP sitting:   110 / 68  (left arm) Cuff size:   large  Vitals Entered By: Shonna Chock CMA (January 16, 2010 8:05 AM) CC: Bronchitis and perspiration, Cough   CC:  Bronchitis and perspiration and Cough.  History of Present Illness: Cough      This is a 70 year old woman who presents with Cough for past 4 weeks, unresponsive to Amox, Levaquin,Doxycycline & a 4th antibiotic  which she can not name , all Rxed while in [place].  The patient reports productive cough with yellow sputum,  exertional dyspnea &  SS pressure, and malaise/fatigue.She denies pleuritic chest pain, wheezing, fever, and hemoptysis.  Associated symtpoms include cold/URI symptoms of facial pain , nasal congestion w/o purulence, acid reflux symptoms(on Zegerid), and peripheral edema.  The patient denies the following symptoms: sore throat, chronic rhinitis, and weight loss.  Risk factors include recurrent sinus infections and history of reflux.  Diagnostic testing to date has included CXR. Cough now from arising until mid afternoon. Not on ACE-I. Dr Annalee Genta Rxed Flonase & Levaquin in 04/11.  Current Medications (verified): 1)  Synthroid 50 Mcg Tabs (Levothyroxine Sodium) .Marland Kitchen.. 1 By Mouth Once Daily 2)  Nexium 40 Mg Pack (Esomeprazole Magnesium) .Marland Kitchen.. 1 By Mouth Once Daily 3)  Verapamil Hcl Cr 240 Mg Xr24h-Cap (Verapamil Hcl) .Marland Kitchen.. 1 By Mouth Once Daily 4)  Spironolactone-Hctz 25-25 Mg Tabs (Spironolactone-Hctz) .Marland Kitchen.. 1 By Mouth Once Daily 5)  Celebrex 200 Mg Caps (Celecoxib) .Marland Kitchen.. 1 By Mouth Once Daily 6)  Effexor Xr 150 Mg Xr24h-Cap (Venlafaxine Hcl) .Marland Kitchen.. 1 By Mouth Once Daily 7)  Metoprolol Succinate 50 Mg Xr24h-Tab (Metoprolol Succinate) .Marland Kitchen.. 1 By Mouth Once Daily 8)  Zegerid 40-1100 Mg Caps (Omeprazole-Sodium  Bicarbonate) .Marland Kitchen.. 1 By Mouth At Bedtime 9)  Lipitor 20 Mg Tabs (Atorvastatin Calcium) .Marland Kitchen.. 1 By Mouth Once Daily 10)  Zolpidem Tartrate 10 Mg Tabs (Zolpidem Tartrate) .Marland Kitchen.. 1 By Mouth At Bedtime 11)  Centrum Silver  Tabs (Multiple Vitamins-Minerals) .Marland Kitchen.. 1 By Mouth Once Daily 12)  Fluticasone Propionate 50 Mcg/act Susp (Fluticasone Propionate) .... 2 Sprays Once Daily  Allergies: 1)  ! Asa 2)  ! Percocet  Review of Systems General:  Complains of chills; Marked sweating  with any exertion. ENT:  Complains of earache; denies ear discharge; No frontal headache. CV:  Denies difficulty breathing at night, difficulty breathing while lying down, and leg cramps with exertion. Allergy:  Complains of itching eyes; denies sneezing; on Flonase.  Physical Exam  General:  well-nourished,in no acute distress; alert,appropriate and cooperative throughout examination Ears:  External ear exam shows no significant lesions or deformities.  Otoscopic examination reveals clear canals, tympanic membranes are intact bilaterally without bulging, retraction, inflammation or discharge. Hearing is grossly normal bilaterally. Minor wax collections Nose:  External nasal examination shows no deformity or inflammation. Nasal mucosa are pink and moist without lesions or exudates. Mouth:  Oral mucosa and oropharynx without lesions or exudates.  Teeth in good repair. Lungs:  Normal respiratory effort, chest expands symmetrically. Lungs are clear to auscultation, no crackles or wheezes. Heart:  Normal rate and regular rhythm. S1 and S2 normal without gallop, murmur, click, rub.S4 with slurring R base Extremities:  No  clubbing, cyanosis, edema. Neg Homan's Skin:  Intact without suspicious lesions or rashes Cervical Nodes:  No lymphadenopathy noted Axillary Nodes:  No palpable lymphadenopathy Psych:  memory intact for recent and remote, normally interactive, and good eye contact.     Impression & Recommendations:  Problem  # 1:  COUGH (ICD-786.2)  R/O RAD   Orders: Venipuncture (04540) TLB-CBC Platelet - w/Differential (85025-CBCD) Specimen Handling (98119) T-2 View CXR (71020TC) T-Sinuses Complete (70220TC)  Problem # 2:  DYSPNEA/SHORTNESS OF BREATH (ICD-786.09)  R/O  subclinical CAD Her updated medication list for this problem includes:    Spironolactone-hctz 25-25 Mg Tabs (Spironolactone-hctz) .Marland Kitchen... 1 by mouth once daily    Metoprolol Succinate 50 Mg Xr24h-tab (Metoprolol succinate) .Marland Kitchen... 1 by mouth once daily    Symbicort 80-4.5 Mcg/act Aero (Budesonide-formoterol fumarate) .Marland Kitchen... 1 puff every 12 hrs; gargle & spit after use    Singulair 10 Mg Tabs (Montelukast sodium) .Marland Kitchen... 1 once daily  Orders: EKG w/ Interpretation (93000)  Problem # 3:  CHEST PAIN (ICD-786.50)  SS exertional pressure; R/O CAD  Orders: EKG w/ Interpretation (93000)  Complete Medication List: 1)  Synthroid 50 Mcg Tabs (Levothyroxine sodium) .Marland Kitchen.. 1 by mouth once daily 2)  Nexium 40 Mg Pack (Esomeprazole magnesium) .Marland Kitchen.. 1 by mouth once daily 3)  Verapamil Hcl Cr 240 Mg Xr24h-cap (Verapamil hcl) .Marland Kitchen.. 1 by mouth once daily 4)  Spironolactone-hctz 25-25 Mg Tabs (Spironolactone-hctz) .Marland Kitchen.. 1 by mouth once daily 5)  Celebrex 200 Mg Caps (Celecoxib) .Marland Kitchen.. 1 by mouth once daily 6)  Effexor Xr 150 Mg Xr24h-cap (Venlafaxine hcl) .Marland Kitchen.. 1 by mouth once daily 7)  Metoprolol Succinate 50 Mg Xr24h-tab (Metoprolol succinate) .Marland Kitchen.. 1 by mouth once daily 8)  Zegerid 40-1100 Mg Caps (Omeprazole-sodium bicarbonate) .Marland Kitchen.. 1 by mouth at bedtime 9)  Lipitor 20 Mg Tabs (Atorvastatin calcium) .Marland Kitchen.. 1 by mouth once daily 10)  Zolpidem Tartrate 10 Mg Tabs (Zolpidem tartrate) .Marland Kitchen.. 1 by mouth at bedtime 11)  Centrum Silver Tabs (Multiple vitamins-minerals) .Marland Kitchen.. 1 by mouth once daily 12)  Fluticasone Propionate 50 Mcg/act Susp (Fluticasone propionate) .... 2 sprays once daily 13)  Symbicort 80-4.5 Mcg/act Aero (Budesonide-formoterol fumarate) .Marland Kitchen.. 1 puff  every 12 hrs; gargle & spit after use 14)  Singulair 10 Mg Tabs (Montelukast sodium) .Marland Kitchen.. 1 once daily  Patient Instructions: 1)  Use Symbicort sample 1 puff every 12 hrs; gargle & swallow after use. Singulair 10 mg once daily . Stress Test &  PFTs may be necessary Prescriptions: SINGULAIR 10 MG TABS (MONTELUKAST SODIUM) 1 once daily  #30 x 0   Entered and Authorized by:   Marga Melnick MD   Signed by:   Marga Melnick MD on 01/16/2010   Method used:   Print then Give to Patient   RxID:   [id number] SYMBICORT 80-4.5 MCG/ACT AERO (BUDESONIDE-FORMOTEROL FUMARATE) 1 puff every 12 hrs; gargle & spit after use  #1 x 0   Entered and Authorized by:   Marga Melnick MD   Signed by:   Marga Melnick MD on 01/16/2010   Method used:   Samples Given   RxID:   [id number]

## 2010-08-01 NOTE — Letter (Signed)
Summary: [facility] - Chest PA/LAT (2) Views  [facility] - Chest PA/LAT (2) Views   Imported By: Marylou Mccoy 07/09/2010 10:12:05  _____________________________________________________________________  External Attachment:    Type:   Image     Comment:   External Document

## 2010-08-12 ENCOUNTER — Ambulatory Visit (INDEPENDENT_AMBULATORY_CARE_PROVIDER_SITE_OTHER): Payer: MEDICARE | Admitting: Internal Medicine

## 2010-08-12 ENCOUNTER — Encounter: Payer: Self-pay | Admitting: Internal Medicine

## 2010-08-12 DIAGNOSIS — R21 Rash and other nonspecific skin eruption: Secondary | ICD-10-CM

## 2010-08-21 NOTE — Assessment & Plan Note (Addendum)
Summary: RASH AROUND EYES/RH.....   Vital Signs:  Patient profile:   70 year old female Weight:      188.8 pounds BMI:     33.57 Temp:     98.1 degrees F oral Pulse rate:   60 / minute Resp:     15 per minute BP sitting:   120 / 78  (left arm) Cuff size:   large  Vitals Entered By: Shonna Chock CMA (August 12, 2010 2:47 PM) CC: Rash around eyes: Patient seen dermatologist x 2 and was last prescribed Hydrocortisone 0.2% cream (not using right now/did NOT help)   Primary Care Provider:  Dr Alwyn Ren  CC:  Rash around eyes: Patient seen dermatologist x 2 and was last prescribed Hydrocortisone 0.2% cream (not using right now/did NOT help).  History of Present Illness:      This is a 70 year old woman who presents with Rash for 2 weeks .Rash began while  using Fluorouraci for forehead pre cancer lesions.Dr Nicholas Lose has changed meds twice w/o benefit( Dexamethasone 0.05% initially the hydrocortisone 0.2%).  The patient reports itching, weeping, redness, and tenderness, but denies hives, welts, pustules, blisters, and ulcers.  The rash is located on the face.  Associated symptoms include nausea and eye symptoms, white discharge in am.  The patient denies the following symptoms: fever, headache, facial swelling, tongue swelling, difficulty breathing, vomiting, diarrhea, dysuria, or new arthralgias.    Current Medications (verified): 1)  Synthroid 50 Mcg Tabs (Levothyroxine Sodium) .Marland Kitchen.. 1 By Mouth Once Daily 2)  Nexium 40 Mg Cpdr (Esomeprazole Magnesium) .Marland Kitchen.. 1 By Mouth Once Daily 3)  Verapamil Hcl Cr 240 Mg Xr24h-Cap (Verapamil Hcl) .Marland Kitchen.. 1 By Mouth Once Daily 4)  Spironolactone-Hctz 25-25 Mg Tabs (Spironolactone-Hctz) .Marland Kitchen.. 1 By Mouth Once Daily 5)  Celebrex 200 Mg Caps (Celecoxib) .Marland Kitchen.. 1 By Mouth Once Daily 6)  Effexor Xr 150 Mg Xr24h-Cap (Venlafaxine Hcl) .Marland Kitchen.. 1 By Mouth Once Daily 7)  Metoprolol Succinate 50 Mg Xr24h-Tab (Metoprolol Succinate) .Marland Kitchen.. 1 By Mouth Once Daily 8)  Zegerid 40-1100 Mg  Caps (Omeprazole-Sodium Bicarbonate) .Marland Kitchen.. 1 By Mouth At Bedtime 9)  Lipitor 20 Mg Tabs (Atorvastatin Calcium) .Marland Kitchen.. 1 By Mouth Once Daily 10)  Zolpidem Tartrate 10 Mg Tabs (Zolpidem Tartrate) .Marland Kitchen.. 1 By Mouth At Bedtime 11)  Centrum Silver  Tabs (Multiple Vitamins-Minerals) .Marland Kitchen.. 1 By Mouth Once Daily 12)  Fluticasone Propionate 50 Mcg/act Susp (Fluticasone Propionate) .... 2 Sprays Once Daily 13)  Astepro 0.15 % Soln (Azelastine Hcl) .Marland Kitchen.. 1-2 Sprays Each Nostril Twice A Day Twice A Day As Needed  Allergies: 1)  ! Asa 2)  ! Percocet 3)  ! * Latex  Physical Exam  General:  in no acute distress; alert,appropriate and cooperative throughout examination Eyes:  No corneal or conjunctival inflammation noted. EOMI. Perrla. Vision grossly normal. Crusting lesions @ corner of eyes.Faint erythema around eyes , especially upper lids Ears:  External ear exam shows no significant lesions or deformities.  Otoscopic examination reveals clear canals, tympanic membranes are intact bilaterally without bulging, retraction, inflammation or discharge. Hearing is grossly normal bilaterally. Nose:  External nasal examination shows no deformity or inflammation. Nasal mucosa are pink and moist without lesions or exudates. Mouth:  Oral mucosa and oropharynx without lesions or exudates.  Teeth in good repair. Skin:  see eyes Cervical Nodes:  No lymphadenopathy noted Axillary Nodes:  No palpable lymphadenopathy   Impression & Recommendations:  Problem # 1:  RASH-NONVESICULAR (ICD-782.1)  Complete Medication List: 1)  Synthroid  50 Mcg Tabs (Levothyroxine sodium) .Marland Kitchen.. 1 by mouth once daily 2)  Nexium 40 Mg Cpdr (Esomeprazole magnesium) .Marland Kitchen.. 1 by mouth once daily 3)  Verapamil Hcl Cr 240 Mg Xr24h-cap (Verapamil hcl) .Marland Kitchen.. 1 by mouth once daily 4)  Spironolactone-hctz 25-25 Mg Tabs (Spironolactone-hctz) .Marland Kitchen.. 1 by mouth once daily 5)  Celebrex 200 Mg Caps (Celecoxib) .Marland Kitchen.. 1 by mouth once daily 6)  Effexor Xr 150 Mg  Xr24h-cap (Venlafaxine hcl) .Marland Kitchen.. 1 by mouth once daily 7)  Metoprolol Succinate 50 Mg Xr24h-tab (Metoprolol succinate) .Marland Kitchen.. 1 by mouth once daily 8)  Zegerid 40-1100 Mg Caps (Omeprazole-sodium bicarbonate) .Marland Kitchen.. 1 by mouth at bedtime 9)  Lipitor 20 Mg Tabs (Atorvastatin calcium) .Marland Kitchen.. 1 by mouth once daily 10)  Zolpidem Tartrate 10 Mg Tabs (Zolpidem tartrate) .Marland Kitchen.. 1 by mouth at bedtime 11)  Centrum Silver Tabs (Multiple vitamins-minerals) .Marland Kitchen.. 1 by mouth once daily 12)  Fluticasone Propionate 50 Mcg/act Susp (Fluticasone propionate) .... 2 sprays once daily 13)  Astepro 0.15 % Soln (Azelastine hcl) .Marland Kitchen.. 1-2 sprays each nostril twice a day twice a day as needed 14)  Mupirocin 2 % Oint (Mupirocin) .... Apply two times a day after gentle cleansing with hypoallergic soap; keep out of eyes  Patient Instructions: 1)  Antibiotic ointment two times a day as prescribed. Prescriptions: MUPIROCIN 2 % OINT (MUPIROCIN) apply two times a day after gentle cleansing with hypoallergic soap; keep out of eyes  #15 grams x 0   Entered and Authorized by:   Marga Melnick MD   Signed by:   Marga Melnick MD on 08/12/2010   Method used:   Electronically to        [facility]* (retail)       [address]       [place], [place]  [id number]       Ph: [phone]       Fax: [phone]   RxID:   [id number]    Orders Added: 1)  Est. Patient Level III [34742]  Appended Document: RASH AROUND EYES/RH...Marland KitchenMarland Kitchen

## 2010-08-21 NOTE — Miscellaneous (Signed)
Summary: Orders Update  Clinical Lists Changes  Orders: Added new Service order of Prescription Created Electronically ([id number]) - Signed 

## 2010-08-26 ENCOUNTER — Encounter: Payer: Self-pay | Admitting: Internal Medicine

## 2010-08-26 ENCOUNTER — Other Ambulatory Visit: Payer: Self-pay | Admitting: Gastroenterology

## 2010-08-26 DIAGNOSIS — R1032 Left lower quadrant pain: Secondary | ICD-10-CM

## 2010-08-29 ENCOUNTER — Ambulatory Visit
Admission: RE | Admit: 2010-08-29 | Discharge: 2010-08-29 | Disposition: A | Discharge status: Home / Self Care | Payer: Medicare Other | Source: Ambulatory Visit | Attending: Gastroenterology | Admitting: Gastroenterology

## 2010-08-29 ENCOUNTER — Encounter: Payer: Self-pay | Admitting: Internal Medicine

## 2010-08-29 DIAGNOSIS — R1032 Left lower quadrant pain: Secondary | ICD-10-CM

## 2010-08-29 MED ORDER — IOHEXOL 300 MG/ML  SOLN
100.0000 mL | Freq: Once | INTRAMUSCULAR | Status: AC | PRN
  Administered 2010-08-29: 100 mL via INTRAVENOUS

## 2010-09-09 LAB — URINALYSIS, ROUTINE W REFLEX MICROSCOPIC
Hgb urine dipstick: NEGATIVE
Protein, ur: NEGATIVE mg/dL
Urobilinogen, UA: 0.2 mg/dL (ref 0.0–1.0)
pH: 7.5 (ref 5.0–8.0)

## 2010-09-09 LAB — CBC
HCT: 32.6 % — ABNORMAL LOW (ref 36.0–46.0)
HCT: 39.7 % (ref 36.0–46.0)
Hemoglobin: 10.8 g/dL — ABNORMAL LOW (ref 12.0–15.0)
MCH: 30.5 pg (ref 26.0–34.0)
MCHC: 33 g/dL (ref 30.0–36.0)
Platelets: 282 10*3/uL (ref 150–400)
RDW: 11.7 % (ref 11.5–15.5)
RDW: 11.8 % (ref 11.5–15.5)
WBC: 5.7 10*3/uL (ref 4.0–10.5)

## 2010-09-09 LAB — BASIC METABOLIC PANEL
BUN: 9 mg/dL (ref 6–23)
CO2: 30 mEq/L (ref 19–32)
Calcium: 9.1 mg/dL (ref 8.4–10.5)
Chloride: 102 mEq/L (ref 96–112)
Creatinine, Ser: 0.68 mg/dL (ref 0.4–1.2)
Glucose, Bld: 119 mg/dL — ABNORMAL HIGH (ref 70–99)
Potassium: 4 mEq/L (ref 3.5–5.1)

## 2010-09-09 LAB — COMPREHENSIVE METABOLIC PANEL
AST: 20 U/L (ref 0–37)
Albumin: 4 g/dL (ref 3.5–5.2)
BUN: 7 mg/dL (ref 6–23)
CO2: 30 mEq/L (ref 19–32)
Calcium: 9.9 mg/dL (ref 8.4–10.5)
Creatinine, Ser: 0.76 mg/dL (ref 0.4–1.2)
GFR calc Af Amer: >60 mL/min (ref >60)
GFR calc non Af Amer: >60 mL/min (ref >60)
Potassium: 4 mEq/L (ref 3.5–5.1)

## 2010-09-09 LAB — SURGICAL PCR SCREEN
MRSA, PCR: POSITIVE — AB
Staphylococcus aureus: POSITIVE — AB

## 2010-09-10 NOTE — Letter (Signed)
Summary: [facility]  [facility]   Imported By: Maryln Gottron 09/03/2010 15:12:28  _____________________________________________________________________  External Attachment:    Type:   Image     Comment:   External Document

## 2010-09-25 ENCOUNTER — Other Ambulatory Visit: Payer: Self-pay | Admitting: Internal Medicine

## 2010-10-08 ENCOUNTER — Other Ambulatory Visit: Payer: Self-pay | Admitting: *Deleted

## 2010-10-08 MED ORDER — OMEPRAZOLE-SODIUM BICARBONATE 40-1100 MG PO CAPS: 1.0000 | ORAL_CAPSULE | Freq: Every day | ORAL | Status: DC

## 2010-11-04 ENCOUNTER — Other Ambulatory Visit: Payer: Self-pay | Admitting: Obstetrics & Gynecology

## 2010-11-15 NOTE — Op Note (Signed)
[place]. [facility]  Patient:    Brenda Stephens, Brenda Stephens Visit Number: [id number] MRN: [id number]          Service Type: DSU Location: [facility] Attending Physician:  Ronne Binning Dictated by:   Nicki Reaper, M.D. Proc. Date: 04/28/01 Admit Date:  04/28/2001                             Operative Report  PREOPERATIVE DIAGNOSIS:  Mass, left middle finger.  POSTOPERATIVE DIAGNOSIS:  Mass, left middle finger.  OPERATION:  Excision mass, left middle finger.  SURGEON:  Nicki Reaper, M.D.  ASSISTANT:  Joaquin Courts, R.N.  ANESTHESIA:  Forearm-based IV regional.  DATE OF OPERATION:  April 28, 2001  ANESTHESIOLOGIST:  Halford Decamp, M.D.  HISTORY:  The patient is a 70 year old female with the history of a mass on the ulnar aspect of the distal interphalangeal joint left middle finger.  This has progressively enlarged.  She is desirous of removal of this.  PROCEDURE:  The patient was brought to the operating room where a forearm-based IV regional anesthetic was carried out without difficulty.  She was prepped and draped using Betadine scrub and solution with the left arm free.  A middle lateral incision was made, carried towards the pulp of the finger, carried down through subcutaneous tissue.  A multilobulated, what appeared to be blood filled mass was immediately encountered; with blunt and sharp dissection this was dissected free.  The neurovascular structures were protected throughout the procedure.  The specimen was sent to pathology.  No further lesions were identified.  The wound was irrigated.  The skin was closed with interrupted 5-0 nylon sutures.  Sterile compressive dressing was applied to the finger.  Patient tolerated the procedure well and was taken to the recovery room for observation in satisfactory condition.  DISPOSITION:  She is discharged home to return to the [facility] in one week on Talwin NX and  Keflex. Dictated by:   Nicki Reaper, M.D. Attending Physician:  Ronne Binning DD:  04/28/01 TD:  04/28/01 Job: [id number]

## 2010-11-15 NOTE — Op Note (Signed)
[place]. [facility]  Patient:    Brenda Stephens, Brenda Stephens                      MRN: [id number] Proc. Date: 07/17/00 Adm. Date:  [id number] Disc. 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  SURGEON:  Loreta Ave, M.D.  ASSISTANT:  Arlys John D. Petrarca, P.A.-C.  ANESTHESIA:  Ankle block with sedation.  SPECIMENS:  Excised neuroma.  CULTURES:  None.  COMPLICATIONS:  None.  DRESSINGS:  Soft compressive.  TOURNIQUET TIME:  30 minutes with an Esmarch at the ankle.  DESCRIPTION OF PROCEDURE:  The patient was brought to the operating room and placed on the operating table in supine position.  After adequate anesthesia had been obtained, right foot was prepped and draped in usual sterile fashion. Exsanguinated with elevation and Esmarch, Esmarch left as a tourniquet at the ankle.  Longitudinal incision between the third and fourth metatarsal heads. Blunt dissection used to expose the structures in the web space.  Digital nerve identified.  Resected distally from the third and fourth toes.  Neuroma right at the bifurcation.  Resected proximal to the intermetatarsal ligament which is partially released, and the nerve pulled distally to make sure the resection was off the weightbearing surface of the foot.  After adequate resection, neuroma sent as a specimen.  Wound irrigated and closed with nylon. Margins of wound injected with Marcaine.  Sterile compressive dressing and wound shield applied.  Brought to the recovery room.  Tolerated surgery well with no complications. DD:  07/18/99 TD:  07/19/00 Job: [id number]

## 2010-11-18 ENCOUNTER — Other Ambulatory Visit: Payer: Self-pay | Admitting: *Deleted

## 2010-11-19 MED ORDER — CELECOXIB 200 MG PO CAPS: 200.0000 mg | ORAL_CAPSULE | Freq: Every day | ORAL | Status: DC | PRN

## 2010-12-06 ENCOUNTER — Encounter: Payer: Self-pay | Admitting: Internal Medicine

## 2010-12-11 ENCOUNTER — Other Ambulatory Visit: Payer: Self-pay | Admitting: Internal Medicine

## 2010-12-11 ENCOUNTER — Telehealth: Payer: Self-pay | Admitting: Internal Medicine

## 2010-12-11 MED ORDER — CELECOXIB 200 MG PO CAPS: 200.0000 mg | ORAL_CAPSULE | Freq: Every day | ORAL | Status: DC | PRN

## 2010-12-11 NOTE — Telephone Encounter (Signed)
Patient has lab appt [phone] Brenda Stephens [id number] --- need lab order she said dr hopper didn't sched lab in dec because she was having sx -

## 2010-12-11 NOTE — Telephone Encounter (Signed)
Lipid,Hep,BMP,CBCD,Stool Cards 272.4/995.20/401.9 Patient is medicare, please make sure patient aware of medicare policy (labs may not be covered prior to appointment)

## 2010-12-11 NOTE — Telephone Encounter (Signed)
Celebrex already filled

## 2010-12-11 NOTE — Telephone Encounter (Signed)
RX sent to pharmacy  

## 2010-12-11 NOTE — Telephone Encounter (Signed)
Cancelled lab & ov & rescheduled [id number] patient will fast

## 2010-12-14 ENCOUNTER — Encounter: Payer: Self-pay | Admitting: Internal Medicine

## 2010-12-16 ENCOUNTER — Encounter: Payer: Self-pay | Admitting: Internal Medicine

## 2010-12-16 NOTE — Telephone Encounter (Signed)
error 

## 2010-12-17 ENCOUNTER — Other Ambulatory Visit: Payer: MEDICARE

## 2010-12-17 ENCOUNTER — Other Ambulatory Visit: Payer: Self-pay

## 2010-12-17 MED ORDER — CELECOXIB 200 MG PO CAPS: 200.0000 mg | ORAL_CAPSULE | Freq: Every day | ORAL | Status: DC | PRN

## 2010-12-18 ENCOUNTER — Encounter: Payer: Self-pay | Admitting: Internal Medicine

## 2010-12-18 ENCOUNTER — Ambulatory Visit (INDEPENDENT_AMBULATORY_CARE_PROVIDER_SITE_OTHER): Payer: MEDICARE | Admitting: Internal Medicine

## 2010-12-18 DIAGNOSIS — Z86010 Personal history of colonic polyps: Secondary | ICD-10-CM

## 2010-12-18 DIAGNOSIS — R5383 Other fatigue: Secondary | ICD-10-CM

## 2010-12-18 DIAGNOSIS — G479 Sleep disorder, unspecified: Secondary | ICD-10-CM

## 2010-12-18 DIAGNOSIS — D509 Iron deficiency anemia, unspecified: Secondary | ICD-10-CM

## 2010-12-18 DIAGNOSIS — E039 Hypothyroidism, unspecified: Secondary | ICD-10-CM

## 2010-12-18 DIAGNOSIS — I10 Essential (primary) hypertension: Secondary | ICD-10-CM

## 2010-12-18 DIAGNOSIS — R5381 Other malaise: Secondary | ICD-10-CM

## 2010-12-18 DIAGNOSIS — M19019 Primary osteoarthritis, unspecified shoulder: Secondary | ICD-10-CM

## 2010-12-18 DIAGNOSIS — E785 Hyperlipidemia, unspecified: Secondary | ICD-10-CM

## 2010-12-18 LAB — CBC WITH DIFFERENTIAL/PLATELET
Basophils Relative: 0.7 % (ref 0.0–3.0)
Eosinophils Relative: 2 % (ref 0.0–5.0)
HCT: 37.5 % (ref 36.0–46.0)
Hemoglobin: 12.9 g/dL (ref 12.0–15.0)
Lymphs Abs: 1.2 10*3/uL (ref 0.7–4.0)
MCV: 88.8 fl (ref 78.0–100.0)
Monocytes Relative: 11.1 % (ref 3.0–12.0)
Platelets: 250 10*3/uL (ref 150.0–400.0)
RBC: 4.22 Mil/uL (ref 3.87–5.11)
WBC: 5.7 10*3/uL (ref 4.5–10.5)

## 2010-12-18 LAB — TSH: TSH: 2.05 u[IU]/mL (ref 0.35–5.50)

## 2010-12-18 LAB — BASIC METABOLIC PANEL
BUN: 14 mg/dL (ref 6–23)
Calcium: 9.7 mg/dL (ref 8.4–10.5)
Chloride: 100 mEq/L (ref 96–112)
Creatinine, Ser: 0.7 mg/dL (ref 0.4–1.2)
GFR: 92.66 mL/min (ref >60.00)

## 2010-12-18 LAB — LIPID PANEL
Cholesterol: 214 mg/dL — ABNORMAL HIGH (ref 0–200)
Total CHOL/HDL Ratio: 3
VLDL: 27 mg/dL (ref 0.0–40.0)

## 2010-12-18 LAB — HEPATIC FUNCTION PANEL
Albumin: 4.4 g/dL (ref 3.5–5.2)
Bilirubin, Direct: 0.1 mg/dL (ref 0.0–0.3)
Total Protein: 7 g/dL (ref 6.0–8.3)

## 2010-12-18 LAB — LDL CHOLESTEROL, DIRECT: Direct LDL: 123 mg/dL

## 2010-12-18 LAB — VITAMIN B12: Vitamin B-12: 282 pg/mL (ref 211–911)

## 2010-12-18 MED ORDER — ZOLPIDEM TARTRATE 10 MG PO TABS: 10.0000 mg | ORAL_TABLET | Freq: Every evening | ORAL | Status: DC | PRN

## 2010-12-18 NOTE — Patient Instructions (Signed)
Consider Sleep evaluation to rule out Sleep Apnea. Preventive Health Care: Exercise  30-45  minutes a day, 3-4 days a week. Walking is especially valuable in preventing Osteoporosis. Eat a low-fat diet with lots of fruits and vegetables, up to 7-9 servings per day. Avoid obesity; your goal = waist less than 35 inches.Consume less than 30 grams of sugar per day from foods & drinks with High Fructose Corn Syrup as #2,3 or #4 on label. Marland Kitchen

## 2010-12-18 NOTE — Assessment & Plan Note (Signed)
Colonoscopy 2010: negative [facility] [place]),due 2020

## 2010-12-18 NOTE — Progress Notes (Signed)
  Subjective:    Patient ID: Brenda Stephens, female    DOB: 08/03/1940, 70 y.o.   MRN: [id number]  HPI HYPERTENSION Disease Monitoring: Blood pressure range-< 140/90  Chest pain, palpitations- no       Dyspnea- no Medications: Compliance- yes  Lightheadedness,Syncope- no    Edema- intermittent, worse in heat or with prolonged sitting  No PMH of DM , but glucose 119 pre op 12/11 Polydipsia w/o polyuria or phagia No non healing skin lesions  HYPERLIPIDEMIA Disease Monitoring: See symptoms for Hypertension Medications: Compliance- yes  Abd pain, bowel changes- constipation; Dr Ramon Dredge, GI , Rxed liquid which has mid abd pain   Muscle aches- no  HYPOTHYROIDISM Medications status(change in dose/brand/mode of administration):no Constitutional: Weight change: stable; Fatigue:chronic; Sleep pattern:sleeping pills ; Appetite:good  Visual change(blurred/diplopia/visual loss):blurred ,Dr Hazle Quant 6/22 Hoarseness:no; Swallowing issues:no Cardiovascular: Palpitations:no; Racing:no; Irregularity:no Derm: Change in nails/hair/skin:no Neuro: Numbness/tingling:no; Tremor:ooccasionally in hands in am Psych: Anxiety:yes; Depression:yes; Panic attacks:occsionally.seeing counsellor Endo: Temperature intolerance: Heat:yes; Cold:no    ROS See HPI above   PMH Smoking Status:quit age 39       Review of Systems     Objective:   Physical Exam Gen.: Healthy and well-nourished in appearance. Flat affect; cooperative throughout exam.  Eyes: No corneal or conjunctival inflammation noted. Extraocular motion intact.  Nose: External nasal exam reveals no deformity or inflammation. Nasal mucosa are pink and moist. No lesions or exudates noted. Septum  w/o deviation  Mouth: Oral mucosa and oropharynx reveal no lesions or exudates. Teeth in good repair.Oropharyngeal crowding Neck: No deformities, masses, or tenderness noted. Range of motion &. Thyroid normal. Lungs: Normal respiratory effort; chest  expands symmetrically. Lungs are clear to auscultation without rales, wheezes, or increased work of breathing. Heart: Normal rate and rhythm. Normal S1 and S2. No gallop, click, or rub. S4 with slurring, no murmur. Abdomen: Bowel sounds normal; abdomen soft and nontender. No masses, organomegaly or hernias noted. No clubbing, cyanosis, edema, or deformity noted. Joints normal. Nail health  good. Vascular: Carotid, radial artery, dorsalis pedis  pulses are full and equal.PTP slightly dcreased No bruits present. Neurologic: Alert and oriented x3. Deep tendon reflexes symmetrical and normal.          Skin: Intact without suspicious lesions or rashes. Lymph: No cervical or  Axillary  lymphadenopathy present. Psych: affect as noted; oriented                                                                                       Assessment & Plan:  See Problem List & assessments Clinical depression despite Velafaxine Plan : see Orders

## 2010-12-20 ENCOUNTER — Ambulatory Visit: Payer: MEDICARE | Admitting: Internal Medicine

## 2010-12-20 LAB — HEMOGLOBIN A1C: Hgb A1c MFr Bld: 6.4 % (ref 4.6–6.5)

## 2010-12-24 ENCOUNTER — Other Ambulatory Visit: Payer: Self-pay | Admitting: Internal Medicine

## 2010-12-25 NOTE — Telephone Encounter (Signed)
Duplicate done 11/2010

## 2011-01-02 ENCOUNTER — Other Ambulatory Visit: Payer: Self-pay | Admitting: Internal Medicine

## 2011-01-03 MED ORDER — SPIRONOLACTONE-HCTZ 25-25 MG PO TABS: 1.0000 | ORAL_TABLET | Freq: Every day | ORAL | Status: DC

## 2011-01-03 MED ORDER — OMEPRAZOLE-SODIUM BICARBONATE 40-1100 MG PO CAPS: 1.0000 | ORAL_CAPSULE | Freq: Every day | ORAL | Status: DC

## 2011-01-03 NOTE — Telephone Encounter (Signed)
RX's sent to pharmacy 

## 2011-01-18 ENCOUNTER — Other Ambulatory Visit: Payer: Self-pay | Admitting: Internal Medicine

## 2011-03-24 ENCOUNTER — Other Ambulatory Visit: Payer: Self-pay | Admitting: Internal Medicine

## 2011-03-24 MED ORDER — SPIRONOLACTONE-HCTZ 25-25 MG PO TABS: 1.0000 | ORAL_TABLET | Freq: Every day | ORAL | Status: DC

## 2011-03-24 NOTE — Telephone Encounter (Signed)
RX sent to pharmacy  

## 2011-04-03 ENCOUNTER — Other Ambulatory Visit: Payer: Self-pay | Admitting: Internal Medicine

## 2011-04-10 ENCOUNTER — Encounter: Payer: Self-pay | Admitting: Internal Medicine

## 2011-04-10 ENCOUNTER — Ambulatory Visit (INDEPENDENT_AMBULATORY_CARE_PROVIDER_SITE_OTHER): Payer: MEDICARE | Admitting: Internal Medicine

## 2011-04-10 DIAGNOSIS — R509 Fever, unspecified: Secondary | ICD-10-CM

## 2011-04-10 DIAGNOSIS — R21 Rash and other nonspecific skin eruption: Secondary | ICD-10-CM

## 2011-04-10 DIAGNOSIS — L988 Other specified disorders of the skin and subcutaneous tissue: Secondary | ICD-10-CM

## 2011-04-10 DIAGNOSIS — L959 Vasculitis limited to the skin, unspecified: Secondary | ICD-10-CM

## 2011-04-10 NOTE — Progress Notes (Signed)
  Subjective:    Patient ID: Brenda Stephens, female    DOB: 11-Jul-1940, 70 y.o.   MRN: [id number]  HPI Onset:9/24 as red ,  lesion RUQ. She was in [place] Trigger/injury:no Pain, redness  With itching; no swelling Constitutional:no fever, chills, sweats, weight change Heme: no abnormal bruising or clotting, lymphadenopathy Treatment/response:10 days Doxycycline from UC starting 9/29 & moist heat with some benefit     Review of Systems     Objective:   Physical Exam she is healthy and well-nourished; in no acute distress  She has a slow S4 without murmurs  Chest is clear; no increased work of breathing  She has no lymphadenopathy about the neck or axilla  Abdomen is soft and nontender; she has no organomegaly  There is a 10 x 9 mm faintly erythematous lesion with a punctate central core. 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 She's had 10 days of  doxycycline  Plan: Rickettsial disease  titers and CBC and differential

## 2011-04-10 NOTE — Patient Instructions (Addendum)
Use warm moist compresses to 3 times a day to the affected area.Please report Warning Signs as discussed.

## 2011-04-14 LAB — ROCKY MTN SPOTTED FVR AB, IGM-BLOOD: ROCKY MTN SPOTTED FEVER, IGM: 0.11 IV

## 2011-05-02 ENCOUNTER — Ambulatory Visit (INDEPENDENT_AMBULATORY_CARE_PROVIDER_SITE_OTHER): Payer: MEDICARE | Admitting: Family Medicine

## 2011-05-02 ENCOUNTER — Encounter: Payer: Self-pay | Admitting: Family Medicine

## 2011-05-02 VITALS — BP 130/80 | HR 70 | Temp 98.5°F | Wt 181.6 lb

## 2011-05-02 DIAGNOSIS — J329 Chronic sinusitis, unspecified: Secondary | ICD-10-CM

## 2011-05-02 DIAGNOSIS — G4700 Insomnia, unspecified: Secondary | ICD-10-CM

## 2011-05-02 DIAGNOSIS — G479 Sleep disorder, unspecified: Secondary | ICD-10-CM

## 2011-05-02 DIAGNOSIS — Z8614 Personal history of Methicillin resistant Staphylococcus aureus infection: Secondary | ICD-10-CM

## 2011-05-02 DIAGNOSIS — I10 Essential (primary) hypertension: Secondary | ICD-10-CM

## 2011-05-02 MED ORDER — ZOLPIDEM TARTRATE 10 MG PO TABS: 10.0000 mg | ORAL_TABLET | Freq: Every evening | ORAL | Status: DC | PRN

## 2011-05-02 MED ORDER — SULFAMETHOXAZOLE-TRIMETHOPRIM 800-160 MG PO TABS: ORAL_TABLET | ORAL | Status: AC

## 2011-05-02 MED ORDER — VERAPAMIL HCL 240 MG PO TBCR: 240.0000 mg | EXTENDED_RELEASE_TABLET | Freq: Every day | ORAL | Status: DC

## 2011-05-02 NOTE — Patient Instructions (Addendum)
MRSA Overview MRSA stands for methicillin-resistant Staphylococcus aureus. It is a type of bacteria that is resistant to some common antibiotics. It can cause infections in the skin and many other places in the body. Staphylococcus aureus, often called "staph," is a bacteria that normally lives on the skin or in the nose. Staph on the surface of the skin or in the nose does not cause problems. However, if the staph enters the body through a cut, wound, or break in the skin, an infection can happen. Up until recently, infections with the MRSA type of staph mainly occurred in hospitals and other healthcare settings. There are now increasing problems with MRSA infections in the community as well. Infections with MRSA may be very serious or even life-threatening. Most MRSA infections are acquired in one of two ways:  Healthcare-associated MRSA (HA-MRSA)   This can be acquired by people in any healthcare setting. MRSA can be a big problem for hospitalized people, people in nursing homes, people in rehabilitation facilities, people with weakened immune systems, dialysis patients, and those who have had surgery.   Community-associated MRSA (CA-MRSA)   Community spread of MRSA is becoming more common. It is known to spread in crowded settings, in jails and prisons, and in situations where there is close skin-to-skin contact, such as during sporting events or in locker rooms. MRSA can be spread through shared items, such as children's toys, razors, towels, or sports equipment.  CAUSES  All staph, including MRSA, are normally harmless unless they enter the body through a scratch, cut, or wound, such as with surgery. All staph, including MRSA, can be spread from person-to-person by touching contaminated objects or through direct contact. SPECIAL GROUPS MRSA can present problems for special groups of people. Some of these groups include:  Breastfeeding women.   The most common problem is MRSA infection of the  breast (mastitis). There is evidence that MRSA can be passed to an infant from infected breast milk. Your caregiver may recommend that you stop breastfeeding until the mastitis is under control.   If you are breastfeeding and have a MRSA infection in a place other than the breast, you may usually continue breastfeeding while under treatment. If taking antibiotics, ask your caregiver if it is safe to continue breastfeeding while taking your prescribed medicines.   Neonates (babies from birth to 1 month old) and infants (babies from 1 month to 1 year old).   There is evidence that MRSA can be passed to a newborn at birth if the mother has MRSA on the skin, in or around the birth canal, or an infection in the uterus, cervix, or vagina. MRSA infection can have the same appearance as a normal newborn or infant rash or several other skin infections. This can make it hard to diagnose MRSA.   Immune compromised people.   If you have an immune system problem, you may have a higher chance of developing a MRSA infection.   People after any type of surgery.   Staph in general, including MRSA, is the most common cause of infections occurring at the site of recent surgery.   People on long-term steroid medicines.   These kinds of medicines can lower your resistance to infection. This can increase your chance of getting MRSA.   People who have had frequent hospitalizations, live in nursing homes or other residential care facilities, have venous or urinary catheters, or have taken multiple courses of antibiotic therapy for any reason.  DIAGNOSIS  Diagnosis of MRSA is   done by cultures of fluid samples that may come from:  Swabs taken from cuts or wounds in infected areas.   Nasal swabs.   Saliva or deep cough specimens from the lungs (sputum).   Urine.   Blood.  Many people are "colonized" with MRSA but have no signs of infection. This means that people carry the MRSA germ on their skin or in their  nose and may never develop MRSA infection.  TREATMENT  Treatment varies and is based on how serious, how deep, or how extensive the infection is. For example:  Some skin infections, such as a small boil or abscess, may be treated by draining yellowish-white fluid (pus) from the site of the infection.   Deeper or more widespread soft tissue infections are usually treated with surgery to drain pus and with antibiotic medicine given by vein or by mouth. This may be recommended even if you are pregnant.   Serious infections may require a hospital stay.  If antibiotics are given, they may be needed for several weeks. PREVENTION  Because many people are colonized with staph, including MRSA, preventing the spread of the bacteria from person-to-person is most important. The best way to prevent the spread of bacteria and other germs is through proper hand washing or by using alcohol-based hand disinfectants. The following are other ways to help prevent MRSA infection within the hospital and community settings.   Healthcare settings:   Strict hand washing or hand disinfection procedures need to be followed before and after touching every patient.   Patients infected with MRSA are placed in isolation to prevent the spread of the bacteria.   Healthcare workers need to wear disposable gowns and gloves when touching or caring for patients infected with MRSA. Visitors may also be asked to wear a gown and gloves.   Hospital surfaces need to be disinfected frequently.   Community settings:   Wash your hands frequently with soap and water for at least 15 seconds. Otherwise, use alcohol-based hand disinfectants when soap and water is not available.   Make sure people who live with you wash their hands often, too.   Do not share personal items. For example, avoid sharing razors and other personal hygiene items, towels, clothing, and athletic equipment.   Wash and dry your clothes and bedding at the  warmest temperatures recommended on the labels.   Keep wounds covered. Pus from infected sores may contain MRSA and other bacteria. Keep cuts and abrasions clean and covered with germ-free (sterile), dry bandages until they are healed.   If you have a wound that appears infected, ask your caregiver if a culture for MRSA and other bacteria should be done.   If you are breastfeeding, talk to your caregiver about MRSA. You may be asked to temporarily stop breastfeeding.  HOME CARE INSTRUCTIONS   Take your antibiotics as directed. Finish them even if you start to feel better.   Avoid close contact with those around you as much as possible. Do not use towels, razors, toothbrushes, bedding, or other items that will be used by others.   To fight the infection, follow your caregiver's instructions for wound care. Wash your hands before and after changing your bandages.   If you have an intravascular device, such as a catheter, make sure you know how to care for it.   Be sure to tell any healthcare providers that you have MRSA so they are aware of your infection.  SEEK IMMEDIATE MEDICAL CARE IF:     The infection appears to be getting worse. Signs include:   Increased warmth, redness, or tenderness around the wound site.   A red line that extends from the infection site.   A dark color in the area around the infection.   Wound drainage that is tan, yellow, or green.   A bad smell coming from the wound.   You feel sick to your stomach (nauseous) and throw up (vomit) or cannot keep medicine down.   You have a fever.   Your baby is older than 3 months with a rectal temperature of 102 F (38.9 C) or higher.   Your baby is 98 months old or younger with a rectal temperature of 100.4 F (38 C) or higher.   You have difficulty breathing.  MAKE SURE YOU:   Understand these instructions.   Will watch your condition.   Will get help right away if you are not doing well or get worse.    Document Released: 06/16/2005 Document Revised: 02/26/2011 Document Reviewed: 09/18/2010 [facility] Patient Information 2012 [place], [place].  Sinusitis Sinuses are air pockets within the bones of your face. The growth of bacteria within a sinus leads to infection. The infection prevents the sinuses from draining. This infection is called sinusitis. SYMPTOMS  There will be different areas of pain depending on which sinuses have become infected.  The maxillary sinuses often produce pain beneath the eyes.   Frontal sinusitis may cause pain in the middle of the forehead and above the eyes.  Other problems (symptoms) include:  Toothaches.   Colored, pus-like (purulent) drainage from the nose.   Swelling, warmth, and tenderness over the sinus areas may be signs of infection.  TREATMENT  Sinusitis is most often determined by an exam.X-rays may be taken. If x-rays have been taken, make sure you obtain your results or find out how you are to obtain them. Your caregiver may give you medications (antibiotics). These are medications that will help kill the bacteria causing the infection. You may also be given a medication (decongestant) that helps to reduce sinus swelling.  HOME CARE INSTRUCTIONS   Only take over-the-counter or prescription medicines for pain, discomfort, or fever as directed by your caregiver.   Drink extra fluids. Fluids help thin the mucus so your sinuses can drain more easily.   Applying either moist heat or ice packs to the sinus areas may help relieve discomfort.   Use saline nasal sprays to help moisten your sinuses. The sprays can be found at your local drugstore.  SEEK IMMEDIATE MEDICAL CARE IF:  You have a fever.   You have increasing pain, severe headaches, or toothache.   You have nausea, vomiting, or drowsiness.   You develop unusual swelling around the face or trouble seeing.  MAKE SURE YOU:   Understand these instructions.   Will watch your condition.    Will get help right away if you are not doing well or get worse.  Document Released: 06/16/2005 Document Revised: 02/26/2011 Document Reviewed: 01/13/2007 [facility] Patient Information 2012 [place], [place].

## 2011-05-02 NOTE — Progress Notes (Signed)
  Subjective:     Brenda Stephens is a 70 y.o. female who presents for evaluation of symptoms of a URI. Symptoms include congestion, facial pain, nasal congestion, nausea without vomiting, no  fever and sinus pressure. Onset of symptoms was 7 days ago, and has been gradually worsening since that time. Treatment to date: none.  The following portions of the patient's history were reviewed and updated as appropriate: allergies, current medications, past family history, past medical history, past social history, past surgical history and problem list.  Review of Systems Pertinent items are noted in HPI.   Objective:    BP 130/80  Pulse 70  Temp(Src) 98.5 F (36.9 C) (Oral)  Wt 181 lb 9.6 oz (82.373 kg)  SpO2 98% General appearance: alert, cooperative and no distress Ears: normal TM's and external ear canals both ears Nose: yellow discharge, moderate congestion, right turbinate edematous, inflamed, ? polyp, left turbinate red, edematous, sinus tenderness right Throat: lips, mucosa, and tongue normal; teeth and gums normal Neck: no adenopathy, no carotid bruit, no JVD, supple, symmetrical, trachea midline and thyroid not enlarged, symmetric, no tenderness/mass/nodules Lungs: clear to auscultation bilaterally Heart: regular rate and rhythm, S1, S2 normal, no murmur, click, rub or gallop   Assessment:    sinusitis   Plan:    Discussed the diagnosis and treatment of sinusitis. Suggested symptomatic OTC remedies. Nasal saline spray for congestion. TMP-SMX DS per orders. Nasal steroids per orders. Follow up as needed. veramyst and culture done for [facility]

## 2011-05-05 LAB — WOUND CULTURE: Gram Stain: NONE SEEN

## 2011-05-13 ENCOUNTER — Other Ambulatory Visit: Payer: Self-pay | Admitting: Internal Medicine

## 2011-05-14 MED ORDER — VENLAFAXINE HCL ER 150 MG PO CP24: 150.0000 mg | ORAL_CAPSULE | Freq: Every day | ORAL | Status: DC

## 2011-05-14 NOTE — Telephone Encounter (Signed)
RX sent

## 2011-06-13 ENCOUNTER — Ambulatory Visit (INDEPENDENT_AMBULATORY_CARE_PROVIDER_SITE_OTHER): Payer: MEDICARE | Admitting: Internal Medicine

## 2011-06-13 ENCOUNTER — Encounter: Payer: Self-pay | Admitting: Internal Medicine

## 2011-06-13 VITALS — BP 124/82 | HR 53 | Temp 98.4°F | Resp 12 | Ht 63.0 in | Wt 184.4 lb

## 2011-06-13 DIAGNOSIS — E785 Hyperlipidemia, unspecified: Secondary | ICD-10-CM

## 2011-06-13 DIAGNOSIS — Z0000 Encounter for general adult medical examination without abnormal findings: Secondary | ICD-10-CM

## 2011-06-13 DIAGNOSIS — E039 Hypothyroidism, unspecified: Secondary | ICD-10-CM

## 2011-06-13 DIAGNOSIS — R7301 Impaired fasting glucose: Secondary | ICD-10-CM

## 2011-06-13 DIAGNOSIS — F419 Anxiety disorder, unspecified: Secondary | ICD-10-CM

## 2011-06-13 DIAGNOSIS — I10 Essential (primary) hypertension: Secondary | ICD-10-CM

## 2011-06-13 LAB — LIPID PANEL
Cholesterol: 181 mg/dL (ref 0–200)
LDL Cholesterol: 91 mg/dL (ref 0–99)
Triglycerides: 92 mg/dL (ref 0.0–149.0)
VLDL: 18.4 mg/dL (ref 0.0–40.0)

## 2011-06-13 LAB — HEPATIC FUNCTION PANEL
AST: 23 U/L (ref 0–37)
Bilirubin, Direct: 0 mg/dL (ref 0.0–0.3)
Total Bilirubin: 0.6 mg/dL (ref 0.3–1.2)

## 2011-06-13 LAB — BASIC METABOLIC PANEL
CO2: 28 mEq/L (ref 19–32)
Chloride: 102 mEq/L (ref 96–112)
Creatinine, Ser: 0.9 mg/dL (ref 0.4–1.2)
Glucose, Bld: 107 mg/dL — ABNORMAL HIGH (ref 70–99)

## 2011-06-13 LAB — TSH: TSH: 0.98 u[IU]/mL (ref 0.35–5.50)

## 2011-06-13 LAB — MICROALBUMIN / CREATININE URINE RATIO: Microalb, Ur: 0.6 mg/dL (ref 0.0–1.9)

## 2011-06-13 MED ORDER — VENLAFAXINE HCL ER 150 MG PO CP24: 150.0000 mg | ORAL_CAPSULE | Freq: Every day | ORAL | Status: DC

## 2011-06-13 MED ORDER — SYNTHROID 50 MCG PO TABS: 50.0000 ug | ORAL_TABLET | Freq: Every day | ORAL | Status: DC

## 2011-06-13 NOTE — Progress Notes (Signed)
Subjective:    Patient ID: Brenda Stephens, female    DOB: January 02, 1941, 70 y.o.   MRN: [id number]  HPI Medicare Wellness Visit:  The following psychosocial & medical history were reviewed as required by Medicare.   Social history: caffeine: 2 cups/day , alcohol:  2-3 glasses wine/ week ,  tobacco use : quit in college  & exercise : water aerobics X 4 for 60 min.   Home & personal  safety / fall risk: no issues, activities of daily living: no limitations , seatbelt use : yes , and smoke alarm employment : yes .  Power of Attorney/Living Will status : in place  Vision ( as recorded per Nurse) & Hearing  evaluation :  Ophth seen this week, post cataract F/U. Acuity slightly decreased to whisper on R Orientation :oriented X 3 , memory & recall :good, spelling  testing: excellent,and mood & affect : normal . Depression / anxiety: denied Travel history : 2009 [place] , immunization status :Shingles & PNA shots needed , transfusion history:  Yes in 2000 post intestinal bleed peri operatively, and preventive health surveillance ( colonoscopies, BMD , etc as per protocol/ [facility]): colonoscopy up to date, Dental care:  Seen this week . Chart reviewed &  Updated. Active issues reviewed & addressed.       Review of Systems  HYPERTENSION: Disease Monitoring  Blood pressure range: not monitored  Chest pain: no   Dyspnea: no   Claudication: no   Medication compliance: yes, she is on CCB & B blocker  Medication Side Effects  Lightheadedness: no   Urinary frequency: no   Edema: no , only with travel    Preventitive Healthcare:  Diet Pattern: none  Salt Restriction: yes       Objective:   Physical Exam Gen.: Healthy and well-nourished in appearance. Alert, appropriate and cooperative throughout exam. Head: Normocephalic without obvious abnormalities. Hair fine Eyes: No corneal or conjunctival inflammation noted. Pupils equal round reactive to light and accommodation.  Extraocular motion intact.    Ears: External  ear exam reveals no significant lesions or deformities. Canals clear .TMs normal.  Nose: External nasal exam reveals no deformity or inflammation. Nasal mucosa are pink and moist. No lesions or exudates noted. Mouth: Oral mucosa and oropharynx reveal no lesions or exudates. Teeth in good repair. Neck: No deformities, masses, or tenderness noted. Range of motion &. Thyroid normal. Lungs: Normal respiratory effort; chest expands symmetrically. Lungs are clear to auscultation without rales, wheezes, or increased work of breathing. Heart: Slow rate and regular  rhythm. Normal S1 and S2. No gallop, click, or rub. S 4 with slight slurring; no murmur. Abdomen: Bowel sounds normal; abdomen soft and nontender. No masses, organomegaly or hernias noted. Genitalia: Dr Seymour Bars   .                                                                                   Musculoskeletal/extremities: Mild  thoracic lordosis. No clubbing, cyanosis, edema, or deformity noted. Range of motion  normal .Tone & strength  normal.Joints normal. Nail health  good. Vascular: Carotid, radial artery, dorsalis pedis and  posterior tibial pulses are full and equal. No  bruits present. Neurologic: Alert and oriented x3. Deep tendon reflexes symmetrical and normal.          Skin: Intact without suspicious lesions or rashes. Lymph: No cervical, axillary lymphadenopathy present. Psych: Mood and affect are normal. Normally interactive                                                                                         Assessment & Plan:  #1 Medicare Wellness Exam; criteria met ; data entered #2 Problem List reviewed ; Assessment/ Recommendations made Plan: see Orders

## 2011-06-13 NOTE — Patient Instructions (Signed)
Eat a low-fat diet with lots of fruits and vegetables, up to 7-9 servings per day. Avoid obesity; your goal is waist measurement < 35 inches.Consume less than 30 grams of sugar per day from foods & drinks with High Fructose Corn Sugar as #1,2,3 or # 4 on label. Follow the low carb nutrition program in [employer] as closely as possible to prevent Diabetes progression & complications. White carbohydrates (potatoes, rice, bread, and pasta) have a high spike of sugar and a high load of sugar. For example a  baked potato has a cup of sugar and a  french fry  2 teaspoons of sugar. Yams, wild  rice, whole grained bread &  wheat pasta have been much lower spike and load of  sugar. Portions should be the size of a deck of cards or your palm.

## 2011-06-13 NOTE — Assessment & Plan Note (Signed)
Her LDL has been mildly elevated at 123, but she has a protective HDL of 72.

## 2011-06-13 NOTE — Assessment & Plan Note (Addendum)
Blood pressure is well controlled. She is on a beta blocker as well as a calcium channel blocker. She states that her cardiologist in [place] had initiated this dual therapy. She has no active symptoms related to the hypertension.  EKG is normal except for sinus bradycardia(see meds above). There no hypertensive changes. There is low voltage in precordial leads due to  body habitus/breast tissue.  Because of excellent blood pressure control and the fact that these 2 agents were prescribed by the cardiologist; no change will be made. She is on low-dose beta blocker and average dose of  calcium channel blocker.  This regimen has also been assessed by 2 cardiologists in the [facility] area when she was seen for preop clearance for her shoulder surgeries.

## 2011-06-13 NOTE — Assessment & Plan Note (Signed)
TSH was therapeutic in June. Clinically she is euthyroid. TSH will be rechecked.

## 2011-06-17 ENCOUNTER — Other Ambulatory Visit: Payer: Self-pay | Admitting: Internal Medicine

## 2011-06-17 MED ORDER — CELECOXIB 200 MG PO CAPS: 200.0000 mg | ORAL_CAPSULE | Freq: Every day | ORAL | Status: DC | PRN

## 2011-06-17 NOTE — Telephone Encounter (Signed)
RX sent

## 2011-06-19 ENCOUNTER — Encounter: Payer: Self-pay | Admitting: Internal Medicine

## 2011-07-02 ENCOUNTER — Other Ambulatory Visit: Payer: Self-pay | Admitting: Internal Medicine

## 2011-07-02 DIAGNOSIS — I10 Essential (primary) hypertension: Secondary | ICD-10-CM

## 2011-07-02 MED ORDER — VERAPAMIL HCL 240 MG PO TBCR: 240.0000 mg | EXTENDED_RELEASE_TABLET | Freq: Every day | ORAL | Status: DC

## 2011-07-02 NOTE — Telephone Encounter (Signed)
RX sent

## 2011-07-08 ENCOUNTER — Ambulatory Visit (INDEPENDENT_AMBULATORY_CARE_PROVIDER_SITE_OTHER): Payer: Self-pay | Admitting: Family Medicine

## 2011-07-08 DIAGNOSIS — Z713 Dietary counseling and surveillance: Secondary | ICD-10-CM

## 2011-08-01 ENCOUNTER — Telehealth: Payer: Self-pay | Admitting: Internal Medicine

## 2011-08-01 DIAGNOSIS — G479 Sleep disorder, unspecified: Secondary | ICD-10-CM

## 2011-08-01 MED ORDER — ZOLPIDEM TARTRATE 10 MG PO TABS: 10.0000 mg | ORAL_TABLET | Freq: Every evening | ORAL | Status: DC | PRN

## 2011-08-01 NOTE — Telephone Encounter (Signed)
q 3rd night prn only

## 2011-08-01 NOTE — Telephone Encounter (Signed)
Zolpidem tartrate 10mg  tablet. Take 1/2 tablet every 3rd night as needed.

## 2011-08-01 NOTE — Telephone Encounter (Signed)
RX sent

## 2011-08-01 NOTE — Telephone Encounter (Signed)
Dr.Hopper please advise would you like to dispense #30 or #10 , last filled 05/02/11 #30 (By Dr.Lowne, patient seen Dr.Lowne for an acute and rx was filled), last OV 05/2011

## 2011-09-02 ENCOUNTER — Other Ambulatory Visit: Payer: Self-pay | Admitting: Internal Medicine

## 2011-09-02 NOTE — Telephone Encounter (Signed)
Prescription sent to pharmacy.

## 2011-09-17 ENCOUNTER — Other Ambulatory Visit: Payer: Self-pay | Admitting: Internal Medicine

## 2011-10-05 ENCOUNTER — Other Ambulatory Visit: Payer: Self-pay | Admitting: Internal Medicine

## 2011-10-06 ENCOUNTER — Other Ambulatory Visit: Payer: Self-pay | Admitting: Internal Medicine

## 2011-10-06 NOTE — Telephone Encounter (Signed)
Refill done.  

## 2011-10-20 ENCOUNTER — Other Ambulatory Visit: Payer: Self-pay | Admitting: Internal Medicine

## 2011-10-21 NOTE — Telephone Encounter (Signed)
Brenda Stephens please advise if ok to give #90 for #30 was always given on previous refill. Note from pharmacy states insurance requires a 90 day supply

## 2011-10-21 NOTE — Telephone Encounter (Signed)
[facility] faxed back a request for 90-day supply otherwise patients insurance will not pay for. Please review and send new prescription for [facility]-day [facility] to [facility] on  Celebrex 200MG  Capsule

## 2011-10-21 NOTE — Telephone Encounter (Signed)
This medication has increased cardiac and GI risk in taken on  regular basis. It should be taken as needed. If her orthopedist or rheumatologist wish to  Prescribe this on  a regular basis that would be fine. I will  prescribe these medicines as needed only, #30 a time.

## 2011-10-24 ENCOUNTER — Telehealth: Payer: Self-pay | Admitting: *Deleted

## 2011-10-24 NOTE — Telephone Encounter (Signed)
Discuss with patient who states that insurance will not cover 30 day supply. Pt indicated the she will have to find a rheumatologist.

## 2011-10-24 NOTE — Telephone Encounter (Signed)
Pt left VM wanting to know why med was not filled for #90. Pt states that she needs this med due to her severe pain. .Left message to call office to advise Pt of Dr Alwyn Ren comments on last refill.  This medication has increased cardiac and GI risk in taken on regular basis. It should be taken as needed. If her orthopedist or rheumatologist wish to Prescribe this on a regular basis that would be fine. I will prescribe these medicines as needed only, #30 a time.

## 2011-10-27 ENCOUNTER — Telehealth: Payer: Self-pay | Admitting: *Deleted

## 2011-10-27 MED ORDER — CELECOXIB 200 MG PO CAPS: ORAL_CAPSULE | ORAL | Status: DC

## 2011-10-27 NOTE — Telephone Encounter (Signed)
Message copied by Verdene Rio on Mon Oct 27, 2011  5:51 PM ------      Message from: Pecola Lawless      Created: Mon Oct 27, 2011  6:16 AM       Please tell her that I am sorry , but Celebrex is not on the 2012 list of potentially inappropriate meds even through the other 2 [employer] [facility] agents  ( Vioxx & Bextra) were & both have been taken off the market.Usually all agents in a class are considered contraindicated if one of class is.Meloxicam (Mobic ) also  is on list.        I reviewed the report in detail  this weekend . A 90 day Rx can be sent; she should still try to take it as needed only if possible. With Voxx & Bextra issues arose with daily maintenance use.

## 2011-10-27 NOTE — Telephone Encounter (Signed)
Discuss with patient, Rx sent     Please tell her that I am sorry , but Celebrex is not on the 2012 list of potentially inappropriate meds even through the other 2 [employer] 2 agents ( Vioxx & Bextra) were & both have been taken off the market.Usually all agents in a class are considered contraindicated if one of class is.Meloxicam (Mobic ) also is on list.  I reviewed the report in detail this weekend . A 90 day Rx can be sent; she should still try to take it as needed only if possible. With Voxx & Bextra issues arose with daily maintenance use

## 2011-11-22 ENCOUNTER — Emergency Department (HOSPITAL_COMMUNITY): Payer: MEDICARE

## 2011-11-22 ENCOUNTER — Encounter (HOSPITAL_COMMUNITY): Payer: Self-pay | Admitting: *Deleted

## 2011-11-22 ENCOUNTER — Emergency Department (HOSPITAL_COMMUNITY)
Admission: EM | Admit: 2011-11-22 | Discharge: 2011-11-22 | Disposition: A | Discharge status: Home / Self Care | Payer: MEDICARE | Attending: Emergency Medicine | Admitting: Emergency Medicine

## 2011-11-22 DIAGNOSIS — R51 Headache: Secondary | ICD-10-CM | POA: Insufficient documentation

## 2011-11-22 DIAGNOSIS — K219 Gastro-esophageal reflux disease without esophagitis: Secondary | ICD-10-CM | POA: Insufficient documentation

## 2011-11-22 DIAGNOSIS — Z79899 Other long term (current) drug therapy: Secondary | ICD-10-CM | POA: Insufficient documentation

## 2011-11-22 DIAGNOSIS — Z860100 Personal history of colon polyps, unspecified: Secondary | ICD-10-CM | POA: Insufficient documentation

## 2011-11-22 DIAGNOSIS — Z96619 Presence of unspecified artificial shoulder joint: Secondary | ICD-10-CM | POA: Insufficient documentation

## 2011-11-22 DIAGNOSIS — E785 Hyperlipidemia, unspecified: Secondary | ICD-10-CM | POA: Insufficient documentation

## 2011-11-22 DIAGNOSIS — I10 Essential (primary) hypertension: Secondary | ICD-10-CM | POA: Insufficient documentation

## 2011-11-22 DIAGNOSIS — R079 Chest pain, unspecified: Secondary | ICD-10-CM | POA: Insufficient documentation

## 2011-11-22 LAB — DIFFERENTIAL
Basophils Relative: 0 % (ref 0–1)
Lymphocytes Relative: 5 % — ABNORMAL LOW (ref 12–46)
Lymphs Abs: 0.8 10*3/uL (ref 0.7–4.0)
Monocytes Relative: 4 % (ref 3–12)
Neutro Abs: 12.8 10*3/uL — ABNORMAL HIGH (ref 1.7–7.7)
Neutrophils Relative %: 90 % — ABNORMAL HIGH (ref 43–77)

## 2011-11-22 LAB — COMPREHENSIVE METABOLIC PANEL
Albumin: 3.5 g/dL (ref 3.5–5.2)
Alkaline Phosphatase: 76 U/L (ref 39–117)
BUN: 18 mg/dL (ref 6–23)
Chloride: 99 mEq/L (ref 96–112)
Glucose, Bld: 133 mg/dL — ABNORMAL HIGH (ref 70–99)
Potassium: 4 mEq/L (ref 3.5–5.1)
Total Bilirubin: 0.3 mg/dL (ref 0.3–1.2)

## 2011-11-22 LAB — CBC
Hemoglobin: 13 g/dL (ref 12.0–15.0)
RBC: 4.18 MIL/uL (ref 3.87–5.11)
WBC: 14.2 10*3/uL — ABNORMAL HIGH (ref 4.0–10.5)

## 2011-11-22 LAB — CK TOTAL AND CKMB (NOT AT ARMC): Total CK: 73 U/L (ref 7–177)

## 2011-11-22 LAB — D-DIMER, QUANTITATIVE: D-Dimer, Quant: 1.8 ug/mL-FEU — ABNORMAL HIGH (ref 0.00–0.48)

## 2011-11-22 MED ORDER — ONDANSETRON HCL 4 MG/2ML IJ SOLN
4.0000 mg | Freq: Once | INTRAMUSCULAR | Status: AC
  Administered 2011-11-22: 4 mg via INTRAVENOUS
  Filled 2011-11-22: qty 2

## 2011-11-22 MED ORDER — IOHEXOL 350 MG/ML SOLN
100.0000 mL | Freq: Once | INTRAVENOUS | Status: AC | PRN
  Administered 2011-11-22: 100 mL via INTRAVENOUS

## 2011-11-22 MED ORDER — SODIUM CHLORIDE 0.9 % IV BOLUS (SEPSIS)
500.0000 mL | Freq: Once | INTRAVENOUS | Status: AC
  Administered 2011-11-22: 500 mL via INTRAVENOUS

## 2011-11-22 MED ORDER — MORPHINE SULFATE 2 MG/ML IJ SOLN
2.0000 mg | Freq: Once | INTRAMUSCULAR | Status: AC
  Administered 2011-11-22: 2 mg via INTRAVENOUS
  Filled 2011-11-22: qty 1

## 2011-11-22 NOTE — ED Notes (Signed)
No chest pain at present only a headache

## 2011-11-22 NOTE — ED Notes (Signed)
Pt c/o CP that lasted 15 minutes. CP resolved. Pt states she has a throbbing HA. Denies, photophobia. Pt had epidural shot thinks that might be cause of HA

## 2011-11-22 NOTE — ED Notes (Signed)
Pt states CP resolved, but she has a throbbing HA

## 2011-11-22 NOTE — ED Provider Notes (Signed)
History     CSN: [id number]  Arrival date & time 11/22/11  4098   First MD Initiated Contact with Patient 11/22/11 [phone]      Chief Complaint  Patient presents with  . Chest Pain    (Consider location/radiation/quality/duration/timing/severity/associated sxs/prior treatment) HPI Pt had sharp, sudden onset L chest pain under L breast lasting just a few min and is now resolved. No fever, chills, cough , SOB. Pt alos c/o bitemporal HA since cortisone injection yesterday. +rhinorrhea and sinus pressure. No focal weakness or visual changes.  Past Medical History  Diagnosis Date  . GERD (gastroesophageal reflux disease)   . Hypertension   . Anemia   . Allergy     latex  . Hx of colonic polyps   . Dyslipidemia   . Eustachian tube dysfunction   . Other abnormal glucose     Past Surgical History  Procedure Date  . Colonoscopy w/ polypectomy   . Total shoulder replacement     Right shoulder  . Appendectomy   . Tubal ligation   . Abdominal hysterectomy     BSO for uterine fibroids  . Cholecystectomy   . Sinus surgery with instatrak   . 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 Hiatal hernia Mother   . Heart attack Mother 65  . Heart disease Father   . Cancer Father     renal cancer  . Heart disease Brother 83    MI    History  Substance Use Topics  . Smoking status: Former Smoker    Quit date: 06/30/1962  . Smokeless tobacco: Not on file   Comment: Quit age 33  . Alcohol Use: 1.8 oz/week    3 Glasses of wine per week     wine 3 glasses weekly    OB History    Grav Para Term Preterm Abortions TAB SAB Ect Mult Living                  Review of Systems  Constitutional: Negative for fever and chills.  HENT: Positive for rhinorrhea and sinus pressure. Negative for sore throat, neck pain and neck stiffness.   Eyes: Negative for visual disturbance.  Respiratory: Negative for choking, shortness of breath and  wheezing.   Cardiovascular: Positive for chest pain. Negative for palpitations and leg swelling.  Gastrointestinal: Negative for nausea, vomiting, abdominal pain and diarrhea.  Musculoskeletal: Negative for back pain.  Skin: Negative for pallor and rash.  Neurological: Positive for headaches. Negative for dizziness, weakness and numbness.    Allergies  Latex; Aspirin; and Oxycodone-acetaminophen  Home Medications   Current Outpatient Rx  Name Route Sig Dispense Refill  . ATORVASTATIN CALCIUM 20 MG PO TABS  TAKE 1 TABLET AT BEDTIME 90 tablet 2  . CELECOXIB 200 MG PO CAPS  TAKE ONE CAPSULE BY MOUTH AS NEEDED NOT TO BE TAKEN REGULARLY DUE TO GI AND CARDIAC RISK 90 capsule 0  . METOPROLOL SUCCINATE ER 50 MG PO TB24 Oral Take 50 mg by mouth daily.      Marland Kitchen ONE-DAILY MULTI VITAMINS PO TABS Oral Take 1 tablet by mouth 2 (two) times a week.     Marland Kitchen NASONEX 50 MCG/ACT NA SUSP  USE TWO SPRAYS EACH      NOSTRIL AT BEDTIME. 17 g 5    5  . FISH OIL 1000 MG PO CAPS Oral Take 1,000 mg by mouth 3 (three) times  a week.     Marland Kitchen OMEPRAZOLE-SODIUM BICARBONATE 40-1100 MG PO CAPS  TAKE 1 CAPSULE BY MOUTH AT BEDTIME. 90 capsule 1  . OMEPRAZOLE-SODIUM BICARBONATE 40-1100 MG PO CAPS Oral Take 1 capsule by mouth every evening.    Marland Kitchen SYSTANE OP Both Eyes Place 1 drop into both eyes 4 (four) times daily.    Marland Kitchen SALINE 0.65 % (SOLN) NA SOLN Nasal Place 1 spray into the nose 3 (three) times daily.    Marland Kitchen SPIRONOLACTONE-HCTZ 25-25 MG PO TABS  TAKE 1 TABLET BY MOUTH DAILY. 90 tablet 1  . SYNTHROID 50 MCG PO TABS Oral Take 1 tablet (50 mcg total) by mouth daily. 90 tablet 3    Dispense as written.  . VENLAFAXINE HCL ER 150 MG PO CP24 Oral Take 1 capsule (150 mg total) by mouth daily. 90 capsule 1  . VERAPAMIL HCL 240 MG PO TBCR Oral Take 240 mg by mouth every morning.    Marland Kitchen VITAMIN C 500 MG PO TABS Oral Take 500 mg by mouth 2 (two) times a week.     Marland Kitchen ZOLPIDEM TARTRATE 10 MG PO TABS Oral Take 5 mg by mouth at bedtime as needed.  1 every 3rd night as needed      BP 143/63  Temp(Src) 97.6 F (36.4 C) (Oral)  Resp 18  SpO2 99%  Physical Exam  Nursing note and vitals reviewed. Constitutional: She is oriented to person, place, and time. She appears well-developed and well-nourished. No distress.  HENT:  Head: Normocephalic and atraumatic.  Mouth/Throat: Oropharynx is clear and moist.       No temporal tenderness. +L and R maxillary TTP  Eyes: EOM are normal. Pupils are equal, round, and reactive to light.  Neck: Normal range of motion. Neck supple.  Cardiovascular: Normal rate and regular rhythm.   Pulmonary/Chest: Effort normal and breath sounds normal. No respiratory distress. She has no wheezes. She has no rales. She exhibits no tenderness.  Abdominal: Soft. Bowel sounds are normal. There is no tenderness. There is no rebound and no guarding.  Musculoskeletal: Normal range of motion. She exhibits no edema and no tenderness.  Neurological: She is alert and oriented to person, place, and time.       5/5 motor in all ext, sensation intact  Skin: Skin is warm and dry. No rash noted. No erythema.  Psychiatric: She has a normal mood and affect. Her behavior is normal.    ED Course  Procedures (including critical care time)  Labs Reviewed  CBC - Abnormal; Notable for the following:    WBC 14.2 (*)    All other components within normal limits  DIFFERENTIAL - Abnormal; Notable for the following:    Neutrophils Relative 90 (*)    Neutro Abs 12.8 (*)    Lymphocytes Relative 5 (*)    All other components within normal limits  COMPREHENSIVE METABOLIC PANEL - Abnormal; Notable for the following:    Sodium 134 (*)    Glucose, Bld 133 (*)    GFR calc non Af Amer 88 (*)    All other components within normal limits  D-DIMER, QUANTITATIVE - Abnormal; Notable for the following:    D-Dimer, Quant 1.80 (*)    All other components within normal limits  CK TOTAL AND CKMB  TROPONIN I   Dg Chest 2 View  11/22/2011   *RADIOLOGY REPORT*  Clinical Data: Sudden onset chest pain in the left chest, jaw, and back.  CHEST - 2 VIEW  Comparison:  01/16/2010  Findings: Normal heart size and pulmonary vascularity.  No focal airspace consolidation in the lungs.  No blunting of costophrenic angles.  Tortuous aorta.  Degenerative changes in the thoracic spine.  Postoperative changes in both shoulders.  No pneumothorax.  IMPRESSION: No evidence of active pulmonary disease.  Original Report Authenticated By: Marlon Pel, M.D.   Ct Head Wo Contrast  11/22/2011  *RADIOLOGY REPORT*  Clinical Data: Severe left temporal region and headache for 13 hours.  CT HEAD WITHOUT CONTRAST  Technique:  Contiguous axial images were obtained from the base of the skull through the vertex without contrast.  Comparison: None.  Findings: Diffuse cerebral atrophy.  Ventricular dilatation consistent with central atrophy.  Patchy low attenuation changes in the deep white matter consistent with small vessel ischemia.  No mass effect or midline shift.  No abnormal extra-axial fluid collections.  Gray-white matter junctions are distinct.  Basal cisterns are not effaced.  No evidence of acute intracranial hemorrhage.  Calcification and apparent ossification along the falx.  Vascular calcifications.  Visualized portions of the mastoid air cells and paranasal sinuses are not opacified.  No depressed skull fractures.  IMPRESSION: No acute intracranial abnormalities.  Chronic atrophy and small vessel ischemic changes.  Original Report Authenticated By: Marlon Pel, M.D.   Ct Angio Chest W/cm &/or Wo Cm  11/22/2011  *RADIOLOGY REPORT*  Clinical Data: Chest pain  CT ANGIOGRAPHY CHEST  Technique:  Multidetector CT imaging of the chest using the standard protocol during bolus administration of intravenous contrast. Multiplanar reconstructed images including MIPs were obtained and reviewed to evaluate the vascular anatomy.  Contrast: OMNIPAQUE IOHEXOL 350  MG/ML SOLN  Comparison: None.  Findings: No filling defects in the pulmonary arterial tree to suggest acute pulmonary thromboembolism.  Negative abnormal mediastinal adenopathy.  Trace coronary artery calcifications in the left main.  No obvious aortic dissection or aneurysm.  No pneumothorax.  No pleural effusion.  No pericardial effusion  Dependent atelectasis bilaterally.  Calcified granuloma in the right lower lobe.  IMPRESSION: No evidence of acute pulmonary thromboembolism.  Original Report Authenticated By: Donavan Burnet, M.D.     1. Chest pain   2. HA (headache)     Date: 11/22/2011  Rate: 67  Rhythm: normal sinus rhythm  QRS Axis: normal  Intervals: normal  ST/T Wave abnormalities: normal  Conduction Disutrbances:none  Narrative Interpretation:   Old EKG Reviewed: none available     MDM  Pt states she is feeling much better. Suspicion for CAD is very low. F/u with PMD. Return for concerns        Loren Racer, MD 11/22/11 [phone]

## 2011-11-22 NOTE — ED Notes (Signed)
The pt has had pain just under her lt breast since 1500.  She  Has also had a headache which she still has had all day bi-lateral neck pain.  She had a cortisone injection in her back yesterday and she wonders if that has caused the pain.

## 2011-11-25 ENCOUNTER — Telehealth: Payer: Self-pay | Admitting: Internal Medicine

## 2011-11-25 NOTE — Telephone Encounter (Signed)
Refill: Zolpidem tartrate 10mg  tablet. Take 1 tablet every 3rd night at bedtime as needed.

## 2011-11-26 MED ORDER — ZOLPIDEM TARTRATE 10 MG PO TABS: 5.0000 mg | ORAL_TABLET | Freq: Every evening | ORAL | Status: DC | PRN

## 2011-11-26 NOTE — Telephone Encounter (Signed)
#   30; 1 pill every 3 rd prn only.

## 2011-11-26 NOTE — Telephone Encounter (Signed)
RX sent

## 2011-11-26 NOTE — Telephone Encounter (Signed)
Dr.Hopper please advise 

## 2011-12-12 ENCOUNTER — Other Ambulatory Visit: Payer: Self-pay | Admitting: Internal Medicine

## 2011-12-12 NOTE — Telephone Encounter (Signed)
Effexor request [last refill 12.14.12 #[id number]]/Sls Please advise.

## 2011-12-12 NOTE — Telephone Encounter (Signed)
OK X1 

## 2012-01-14 ENCOUNTER — Other Ambulatory Visit: Payer: Self-pay | Admitting: Internal Medicine

## 2012-01-16 ENCOUNTER — Telehealth: Payer: Self-pay | Admitting: Internal Medicine

## 2012-01-16 MED ORDER — METOPROLOL SUCCINATE ER 50 MG PO TB24: 50.0000 mg | ORAL_TABLET | Freq: Every day | ORAL | Status: DC

## 2012-01-16 NOTE — Telephone Encounter (Signed)
I spoke with patient's husband (In person) and informed him we sent rx over for fluid  pill as per that is what the pharmacy requested. Patient's husband called his wife to verify what she needed and she states she called in request for Metoprol (Blood pressure med)and the pharmacy may have sent over for the wrong med.   I printed off rx for Metoprolol and patients husband took it to the pharmacy

## 2012-01-16 NOTE — Telephone Encounter (Signed)
Pts husband states the pt needs her fluid pill called into the pharmacy. He states he believes the name of the medication is metoprolol. Pts husband states the pharmacy told him they contacted [place] on 01/08/12 for this medication. I do not see that, however I do see request for spironolactone-hydrochlorothiazide from 01/14/12.

## 2012-02-01 ENCOUNTER — Other Ambulatory Visit: Payer: Self-pay | Admitting: Internal Medicine

## 2012-02-03 MED ORDER — ZOLPIDEM TARTRATE 10 MG PO TABS: ORAL_TABLET | ORAL | Status: DC

## 2012-02-03 NOTE — Telephone Encounter (Signed)
Refill: Zolpidem tartrate 10mg  tablet. Take 1/2 tablet by mouth at bedtime as needed and 1 tablet every 3rd night as needed. Last fill 11-26-11

## 2012-02-04 ENCOUNTER — Other Ambulatory Visit: Payer: Self-pay | Admitting: Internal Medicine

## 2012-03-03 ENCOUNTER — Encounter: Payer: Self-pay | Admitting: Cardiology

## 2012-03-03 ENCOUNTER — Ambulatory Visit (INDEPENDENT_AMBULATORY_CARE_PROVIDER_SITE_OTHER): Payer: MEDICARE | Admitting: Cardiology

## 2012-03-03 VITALS — BP 155/79 | HR 54 | Ht 63.0 in | Wt 181.2 lb

## 2012-03-03 DIAGNOSIS — E039 Hypothyroidism, unspecified: Secondary | ICD-10-CM

## 2012-03-03 DIAGNOSIS — R0789 Other chest pain: Secondary | ICD-10-CM

## 2012-03-03 DIAGNOSIS — R079 Chest pain, unspecified: Secondary | ICD-10-CM

## 2012-03-03 DIAGNOSIS — I10 Essential (primary) hypertension: Secondary | ICD-10-CM

## 2012-03-03 NOTE — Patient Instructions (Addendum)
The current medical regimen is effective;  continue present plan and medications.  Please have blood work today.(TSH)  Your physician has requested that you have an exercise tolerance test. For further information please visit [email or web address] Please also follow instruction sheet, as given.  Follow up with Dr Antoine Poche after testing.  Dr Signe [facility] [phone]

## 2012-03-03 NOTE — Progress Notes (Signed)
HPI The patient presents for evaluation of chest discomfort.  I saw her a few years ago preoperatively prior to a shoulder surgery. She did well with this and didn't have any evidence of vascular disease. She's not had any problems until earlier this year. She had an epidural injection and coincident with that developed some chest discomfort. She presented to the emergency room and I reviewed these records. CT was negative for any evidence of pulmonary embolism and there was no clear cardiac etiology. She was not admitted. Since that time she has been having trouble with diaphoresis, hot flashes and flushing exacerbated by minimal activities that would not have caused this before. She seems to have developed a profound he can tolerance. She's been short of breath with activities more than usual. Recently after carrying some items of stairs but normally does not have bothered her she developed some sharp discomfort under her left breast. There was no nausea vomiting. She was hot and diaphoretic. There was no jaw discomfort but she's had a fullness in her upper chest and jaw at times.  Allergies  Allergen Reactions  . Latex     rash  . Aspirin     GI bleeding  . Oxycodone-Acetaminophen     nausea    Current Outpatient Prescriptions  Medication Sig Dispense Refill  . atorvastatin (LIPITOR) 20 MG tablet TAKE 1 TABLET AT BEDTIME  90 tablet  2  . CELEBREX 200 MG capsule TAKE ONE CAPSULE BY MOUTH AS NEEDED NOT TO BE TAKEN REGULARLY DUE TO GI AND CARDIAC RISK  90 capsule  0  . metoprolol succinate (TOPROL-XL) 50 MG 24 hr tablet Take 1 tablet (50 mg total) by mouth daily.  90 tablet  1  . Multiple Vitamin (MULTIVITAMIN) tablet Take 1 tablet by mouth 2 (two) times a week.       Marland Kitchen NASONEX 50 MCG/ACT nasal spray USE TWO SPRAYS EACH      NOSTRIL AT BEDTIME.  17 g  5  . Omega-3 Fatty Acids (FISH OIL) 1000 MG CAPS Take 1,000 mg by mouth 3 (three) times a week.       Marland Kitchen omeprazole-sodium bicarbonate  (ZEGERID) 40-1100 MG per capsule TAKE 1 CAPSULE BY MOUTH AT BEDTIME.  90 capsule  1  . Polyethyl Glycol-Propyl Glycol (SYSTANE OP) Place 1 drop into both eyes 4 (four) times daily.      . Saline (AYR SALINE NASAL DROPS) 0.65 % (SOLN) SOLN Place 1 spray into the nose 3 (three) times daily.      Marland Kitchen spironolactone-hydrochlorothiazide (ALDACTAZIDE) 25-25 MG per tablet TAKE 1 TABLET BY MOUTH DAILY.  90 tablet  1  . SYNTHROID 50 MCG tablet Take 1 tablet (50 mcg total) by mouth daily.  90 tablet  3  . SYNTHROID 50 MCG tablet TAKE 1 TABLET ONCE DAILY  90 tablet  0  . venlafaxine XR (EFFEXOR-XR) 150 MG 24 hr capsule TAKE 1 CAPSULE (150 MG TOTAL) BY MOUTH DAILY.  90 capsule  0  . verapamil (CALAN-SR) 240 MG CR tablet Take 240 mg by mouth every morning.      . vitamin C (ASCORBIC ACID) 500 MG tablet Take 500 mg by mouth 2 (two) times a week.       . zolpidem (AMBIEN) 10 MG tablet 1 every 3rd night as needed  30 tablet  0  . DISCONTD: omeprazole-sodium bicarbonate (ZEGERID) 40-1100 MG per capsule Take 1 capsule by mouth every evening.      Marland Kitchen DISCONTD:  SYNTHROID 50 MCG tablet TAKE 1 TABLET ONCE DAILY  90 tablet  0    Past Medical History  Diagnosis Date  . GERD (gastroesophageal reflux disease)   . Hypertension   . Anemia   . Allergy     latex  . Hx of colonic polyps   . Dyslipidemia   . Eustachian tube dysfunction   . Other abnormal glucose     Past Surgical History  Procedure Date  . Colonoscopy w/ polypectomy   . Total shoulder replacement     Right shoulder  . Appendectomy   . Tubal ligation   . Abdominal hysterectomy     BSO for uterine fibroids  . Cholecystectomy   . Sinus surgery with instatrak   . 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 Cataract extraction, bilateral   . Exploratory laparotomy     ROS:  Insomnia.  As stated in the HPI and negative for all other systems.  PHYSICAL EXAM BP 155/79  Pulse 54  Ht 5\' 3"  (1.6 m)  Wt 181 lb 3.2 oz (82.192 kg)  BMI 32.10  kg/m2 GENERAL:  Well appearing HEENT:  Pupils equal round and reactive, fundi not visualized, oral mucosa unremarkable NECK:  No jugular venous distention, waveform within normal limits, carotid upstroke brisk and symmetric, no bruits, no thyromegaly LYMPHATICS:  No cervical, inguinal adenopathy LUNGS:  Clear to auscultation bilaterally BACK:  No CVA tenderness CHEST:  Unremarkable HEART:  PMI not displaced or sustained,S1 and S2 within normal limits, no S3, no S4, no clicks, no rubs, no murmurs ABD:  Flat, positive bowel sounds normal in frequency in pitch, no bruits, no rebound, no guarding, no midline pulsatile mass, no hepatomegaly, no splenomegaly EXT:  2 plus pulses throughout, no edema, no cyanosis no clubbing SKIN:  No rashes no nodules NEURO:  Cranial nerves II through XII grossly intact, motor grossly intact throughout PSYCH:  Cognitively intact, oriented to person place and time   EKG:  Sinus bradycardia, rate 54, axis within normal limits, intervals within normal limits, no acute ST-T wave changes.  ASSESSMENT AND PLAN  DIAPHORESIS -  This is the most significant complaint. I check her TSH. She's already been to see her gynecologist. Further evaluation will be based on this.  HYPERTENSION - Her blood pressure is somewhat elevated. I last her to keep a blood pressure diary and this will also be evaluated at the time of her POET.  CHEST PAIN -  This is somewhat atypical. However, to further evaluate this she will have a POET (Plain Old Exercise Test). This will allow me to screen for obstructive coronary disease, risk stratify and very importantly provide a prescription for exercise. I will be very curious to see what happens with her heart rate and blood pressure to see if this reproduces any of her symptoms.

## 2012-03-04 ENCOUNTER — Telehealth: Payer: Self-pay | Admitting: Cardiology

## 2012-03-04 NOTE — Telephone Encounter (Signed)
Na at home number after several rings.  Will continue to try to contact pt about consult.

## 2012-03-04 NOTE — Telephone Encounter (Signed)
Please return call to patient [phone]  Pt needs referral info given to her at 9/4 appnt

## 2012-03-05 NOTE — Telephone Encounter (Signed)
Spoke with scheduling at [facility] - pt does not need a referral to schedule an appointment there.  She was seen there in 2012.

## 2012-03-08 NOTE — Telephone Encounter (Signed)
Pt wanted to know the name of MD Dr Antoine Poche referred her to- Brenda Stephens

## 2012-03-08 NOTE — Telephone Encounter (Signed)
Left message to call back  

## 2012-03-08 NOTE — Telephone Encounter (Signed)
Pt returning nurse call she can be reached at [phone]

## 2012-03-14 ENCOUNTER — Other Ambulatory Visit: Payer: Self-pay | Admitting: Internal Medicine

## 2012-03-16 NOTE — Telephone Encounter (Signed)
Patient due for CPX 05/2012

## 2012-03-18 ENCOUNTER — Encounter: Payer: MEDICARE | Admitting: Physician Assistant

## 2012-03-19 ENCOUNTER — Encounter: Payer: Self-pay | Admitting: Internal Medicine

## 2012-03-26 ENCOUNTER — Other Ambulatory Visit: Payer: Self-pay | Admitting: Internal Medicine

## 2012-03-26 NOTE — Telephone Encounter (Signed)
Rx sent. Left message advising pt Rx sent and Dr. Ronda Fairly appt in 12/13.     MW

## 2012-03-26 NOTE — Telephone Encounter (Signed)
Pt over due for OV. Last seen 06/12/12, Last filled 11/22/11 #90 x2. Plz Advise          MW

## 2012-03-26 NOTE — Telephone Encounter (Signed)
OK X 1, #90. Recommend appt by 12/13

## 2012-03-31 ENCOUNTER — Other Ambulatory Visit: Payer: Self-pay | Admitting: Internal Medicine

## 2012-03-31 ENCOUNTER — Encounter: Payer: Self-pay | Admitting: Physician Assistant

## 2012-03-31 ENCOUNTER — Ambulatory Visit (INDEPENDENT_AMBULATORY_CARE_PROVIDER_SITE_OTHER): Payer: MEDICARE | Admitting: Physician Assistant

## 2012-03-31 DIAGNOSIS — R079 Chest pain, unspecified: Secondary | ICD-10-CM

## 2012-03-31 DIAGNOSIS — R0789 Other chest pain: Secondary | ICD-10-CM

## 2012-03-31 NOTE — Procedures (Signed)
Exercise Treadmill Test  Pre-Exercise Testing Evaluation Rhythm: sinus bradycardia  Rate: 57   PR:  .16 QRS:  .07  QT:  .45 QTc: .44     Test  Exercise Tolerance Test Ordering MD: Angelina Sheriff, MD  Interpreting MD: Tereso Newcomer , PA-C  Unique Test No: 1  Treadmill:  1  Indication for ETT: chest pain - rule out ischemia  Contraindication to ETT: No   Stress Modality: exercise - treadmill  Cardiac Imaging Performed: non   Protocol: standard Bruce - maximal  Max BP:  182/67  Max MPHR (bpm):  150 85% MPR (bpm):  128  MPHR obtained (bpm):  121 % MPHR obtained:  80%  Reached 85% MPHR (min:sec):  n/a Total Exercise Time (min-sec):  3:22  Workload in METS:  4.6 Borg Scale: 15  Reason ETT Terminated:  patient's desire to stop    ST Segment Analysis At Rest: normal ST segments - no evidence of significant ST depression With Exercise: no evidence of significant ST depression  Other Information Arrhythmia:  one couplet in early recovery Angina during ETT:  absent (0) Quality of ETT:  non-diagnostic  ETT Interpretation:  No ischemic changes at submaximal exercise.   Comments: Poor exercise tolerance. No chest pain. Normal BP response to exercise. No ST-T changes to suggest ischemia at submaximal exercise.   Recommendations: Schedule Lexiscan myoview. Tereso Newcomer, PA-C  12:56 PM 03/31/2012

## 2012-04-06 ENCOUNTER — Ambulatory Visit (HOSPITAL_COMMUNITY): Payer: MEDICARE | Attending: Cardiology | Admitting: Radiology

## 2012-04-06 VITALS — BP 111/67 | Ht 64.0 in | Wt 176.0 lb

## 2012-04-06 DIAGNOSIS — R0602 Shortness of breath: Secondary | ICD-10-CM | POA: Insufficient documentation

## 2012-04-06 DIAGNOSIS — R0989 Other specified symptoms and signs involving the circulatory and respiratory systems: Secondary | ICD-10-CM | POA: Insufficient documentation

## 2012-04-06 DIAGNOSIS — R079 Chest pain, unspecified: Secondary | ICD-10-CM | POA: Insufficient documentation

## 2012-04-06 DIAGNOSIS — I10 Essential (primary) hypertension: Secondary | ICD-10-CM | POA: Insufficient documentation

## 2012-04-06 DIAGNOSIS — R0609 Other forms of dyspnea: Secondary | ICD-10-CM | POA: Insufficient documentation

## 2012-04-06 MED ORDER — TECHNETIUM TC 99M SESTAMIBI GENERIC - CARDIOLITE
33.0000 | Freq: Once | INTRAVENOUS | Status: AC | PRN
  Administered 2012-04-06: 33 via INTRAVENOUS

## 2012-04-06 MED ORDER — TECHNETIUM TC 99M SESTAMIBI GENERIC - CARDIOLITE
11.0000 | Freq: Once | INTRAVENOUS | Status: AC | PRN
  Administered 2012-04-06: 11 via INTRAVENOUS

## 2012-04-06 MED ORDER — REGADENOSON 0.4 MG/5ML IV SOLN
0.4000 mg | Freq: Once | INTRAVENOUS | Status: AC
  Administered 2012-04-06: 0.4 mg via INTRAVENOUS

## 2012-04-06 NOTE — Progress Notes (Signed)
[facility] SITE 3 NUCLEAR MED [id number] [place] [address] [phone]  Cardiology Nuclear Med Study  Brenda Stephens is a 71 y.o. female     MRN : [id number]     DOB: [birth year]  Procedure Date: 04/06/2012  Nuclear Med Background Indication for Stress Test:  Evaluation for Ischemia History:  chronic Fatigue, 03/31/12 Cardiac Risk Factors: History of Smoking, Hypertension and Lipids  Symptoms:  Chest Pain, DOE and SOB   Nuclear Pre-Procedure Caffeine/Decaff Intake:  7:30pm NPO After: 7:30pm   Lungs:  clear O2 Sat: 97% on room air. IV 0.9% NS with Angio Cath:  22g  IV Site: R Hand  IV Started by:  Cathlyn Parsons, RN  Chest Size (in):  38 Cup Size: DD  Height: 5\' 4"  (1.626 m)  Weight:  176 lb (79.833 kg)  BMI:  Body mass index is 30.21 kg/(m^2). Tech Comments:  Toprol taken at 0745    Nuclear Med Study 1 or 2 day study: 1 day  Stress Test Type:  Eugenie Birks  Reading MD: Olga Millers, MD  Order Authorizing Provider:  Melany Guernsey and Lorin Picket [facility]  Resting Radionuclide: Technetium 25m Sestamibi  Resting Radionuclide Dose: 11.0 mCi   Stress Radionuclide:  Technetium 58m Sestamibi  Stress Radionuclide Dose: 33.0 mCi           Stress Protocol Rest HR: 47 Stress HR: 59  Rest BP: 111/67 Stress BP: 108/65  Exercise Time (min): n/a METS: n/a   Predicted Max HR: 150 bpm % Max HR: 39.33 bpm Rate Pressure Product: 6549   Dose of Adenosine (mg):  n/a Dose of Lexiscan: 0.4 mg  Dose of Atropine (mg): n/a Dose of Dobutamine: n/a mcg/kg/min (at max HR)  Stress Test Technologist: Milana Na, EMT-P  Nuclear Technologist:  Domenic Polite, CNMT     Rest Procedure:  Myocardial perfusion imaging was performed at rest 45 minutes following the intravenous administration of Tchnetium 31m Sestamibi. Rest ECG: Sinus Bradycardia  Stress Procedure:  The patient received IV Lexiscan 0.4 mg over 15-seconds.  Technetium 53m Sestamibi  injected at 30-seconds.  There were no significant changes with Lexiscan.  Quantitative spect images were obtained after a 45 minute delay. Stress ECG: No significant change from baseline ECG  QPS Raw Data Images:  Acquisition technically good; normal left ventricular size. Stress Images:  There is decreased uptake in the apex. Rest Images:  There is decreased uptake in the apex. Subtraction (SDS):  No evidence of ischemia. Transient Ischemic Dilatation (Normal <1.22):  1.11 Lung/Heart Ratio (Normal <0.45):  0.52  Quantitative Gated Spect Images QGS EDV:  64 ml QGS ESV:  18 ml  Impression Exercise Capacity:  Lexiscan with no exercise. BP Response:  Normal blood pressure response. Clinical Symptoms:  There is dyspnea. ECG Impression:  No significant ST segment change suggestive of ischemia. Comparison with Prior Nuclear Study: No images to compare  Overall Impression:  Normal stress nuclear study with a small, mild, fixed apical defect consistent with thinning; no ischemia.  LV Ejection Fraction: 72%.  LV Wall Motion:  NL LV Function; NL Wall Motion   Olga Millers

## 2012-04-08 ENCOUNTER — Encounter: Payer: Self-pay | Admitting: Physician Assistant

## 2012-04-09 ENCOUNTER — Other Ambulatory Visit: Payer: Self-pay | Admitting: Internal Medicine

## 2012-04-12 NOTE — Telephone Encounter (Signed)
Number 30 given on 02/03/12, last OV 05/2011   Dr.Hopper please advise

## 2012-04-12 NOTE — Telephone Encounter (Signed)
#   30.One every 3rd night as needed only; Rx should last  @ least 90 days

## 2012-04-13 NOTE — Addendum Note (Signed)
Addended by: Sharin Grave on: 04/13/2012 01:15 PM   Modules accepted: Level of Service

## 2012-04-27 ENCOUNTER — Encounter: Payer: Self-pay | Admitting: Internal Medicine

## 2012-04-27 ENCOUNTER — Ambulatory Visit (INDEPENDENT_AMBULATORY_CARE_PROVIDER_SITE_OTHER): Payer: MEDICARE | Admitting: Internal Medicine

## 2012-04-27 VITALS — BP 130/82 | HR 90 | Temp 98.3°F | Wt 176.8 lb

## 2012-04-27 DIAGNOSIS — J310 Chronic rhinitis: Secondary | ICD-10-CM

## 2012-04-27 DIAGNOSIS — J029 Acute pharyngitis, unspecified: Secondary | ICD-10-CM

## 2012-04-27 MED ORDER — AZITHROMYCIN 250 MG PO TABS: ORAL_TABLET | ORAL | Status: DC

## 2012-04-27 NOTE — Progress Notes (Signed)
  Subjective:    Patient ID: Brenda Stephens, female    DOB: 15-Jul-1940, 71 y.o.   MRN: [id number]  HPI  She has had sinus symptoms for-5 weeks manifested as pressure in the frontal and maxillary sinuses; postnasal drainage; and sore throat. This failed to resolve with 2 weeks of Augmentin from her otolaryngologist. CT scans performed by him showed no air-fluid level or even mucosal thickening. He did perform a chemical cautery in the office.  She has been using Nasonex 2 sprays at night in each nostril w/o  using a crossover technique.  She had chills on 2 days without fever. She describes gum pain without associated dental pain.    Review of Systems  She's had some nonproductive cough without associated wheezing or shortness of breath. She is not had extrinsic symptoms of itchy, watery eyes or sneezing.     Objective:   Physical Exam General appearance:good health ;well nourished; no acute distress or increased work of breathing is present.  No  lymphadenopathy about the head, neck, or axilla noted.   Eyes: No conjunctival inflammation or lid edema is present.   Ears:  External ear exam shows no significant lesions or deformities.  Otoscopic examination reveals some wax bilaterally Nose:  External nasal examination shows no deformity or inflammation. Nasal mucosa are dry; there is some eschar of the right nasal septum probably related to the cautery. No septal dislocation or deviation.No obstruction to airflow.   Oral exam: Dental hygiene is good; lips and gums are healthy appearing.There is no oropharyngeal erythema or exudate noted.   Neck:  No deformities,  masses, or tenderness noted.   Supple with full range of motion without pain.   Heart:  Normal rate and regular rhythm. S1 and S2 normal without gallop, murmur, click, rub or other extra sounds.   Lungs:Chest clear to auscultation; no wheezes, rhonchi,rales ,or rubs present.No increased work of breathing.    Extremities:  No  cyanosis, edema, or clubbing  noted    Skin: Warm & dry .          Assessment & Plan:  #1 rhinitis with subsequent sore throat and cough. Historically, clinically, and by CAT scan there is no evidence of active spinal sinusitis. The most striking physical finding is the marked drying of the nasal septum.  #2 positive rapid strep   plan: See orders and recommendations

## 2012-04-27 NOTE — Patient Instructions (Addendum)
Use vitamin A&D  twice a day  for the drying.  Plain Mucinex for thick secretions ;force NON dairy fluids . Use a Neti pot daily as needed for sinus congestion; going from open side to congested side . Nasal cleansing in the shower as discussed. Make sure that all residual soap is removed to prevent irritation. Nasonex 1 spray in each nostril twice a day as needed. Use the "crossover" technique as discussed. Plain Allegra 160 daily as needed for itchy eyes & sneezing. Zicam Melts or Zinc lozenges for the sore throat

## 2012-05-10 ENCOUNTER — Other Ambulatory Visit: Payer: Self-pay | Admitting: Internal Medicine

## 2012-05-10 NOTE — Telephone Encounter (Signed)
Per Dr.Hopper ok to give #90. I left message on voicemail for patient to return call to verify which pharmacy rx is to go to (3 pharmacies on file)

## 2012-05-10 NOTE — Telephone Encounter (Signed)
Pt needs refill on : celebrex 200mg 

## 2012-05-11 MED ORDER — CELECOXIB 200 MG PO CAPS: ORAL_CAPSULE | ORAL | Status: DC

## 2012-05-11 NOTE — Telephone Encounter (Signed)
Rx sent to [facility] pharmacy.      MW 

## 2012-05-11 NOTE — Telephone Encounter (Signed)
uses [facility] on [address] per spouse

## 2012-05-31 ENCOUNTER — Other Ambulatory Visit: Payer: Self-pay | Admitting: Internal Medicine

## 2012-05-31 MED ORDER — ATORVASTATIN CALCIUM 20 MG PO TABS: ORAL_TABLET | ORAL | Status: DC

## 2012-05-31 NOTE — Telephone Encounter (Signed)
RX sent

## 2012-05-31 NOTE — Telephone Encounter (Signed)
refill atorvastatin 20 mg T PO QHS #90 last ov 10.29.13 acute

## 2012-06-09 ENCOUNTER — Other Ambulatory Visit: Payer: Self-pay | Admitting: Internal Medicine

## 2012-06-11 NOTE — Telephone Encounter (Signed)
#   30 ; 1 every 3rd night prn only

## 2012-06-11 NOTE — Telephone Encounter (Signed)
#   30 was given on 04/09/12, please advise

## 2012-06-17 ENCOUNTER — Telehealth: Payer: Self-pay | Admitting: Internal Medicine

## 2012-06-17 MED ORDER — VENLAFAXINE HCL ER 150 MG PO CP24: ORAL_CAPSULE | ORAL | Status: DC

## 2012-06-17 NOTE — Telephone Encounter (Signed)
Rx sent 

## 2012-06-17 NOTE — Telephone Encounter (Signed)
Refill: Venlafaxine hcl er 150 mg cap. Take 1 capsule by mouth daily. Qty 90. Last fill 03-16-12

## 2012-07-04 ENCOUNTER — Other Ambulatory Visit: Payer: Self-pay | Admitting: Internal Medicine

## 2012-07-16 ENCOUNTER — Encounter: Payer: Self-pay | Admitting: Lab

## 2012-07-19 ENCOUNTER — Ambulatory Visit (INDEPENDENT_AMBULATORY_CARE_PROVIDER_SITE_OTHER): Payer: MEDICARE | Admitting: Internal Medicine

## 2012-07-19 ENCOUNTER — Encounter: Payer: Self-pay | Admitting: Internal Medicine

## 2012-07-19 VITALS — BP 120/78 | HR 63 | Temp 97.8°F | Resp 12 | Ht 63.0 in | Wt 178.0 lb

## 2012-07-19 DIAGNOSIS — I10 Essential (primary) hypertension: Secondary | ICD-10-CM

## 2012-07-19 DIAGNOSIS — Z86010 Personal history of colonic polyps: Secondary | ICD-10-CM

## 2012-07-19 DIAGNOSIS — E785 Hyperlipidemia, unspecified: Secondary | ICD-10-CM

## 2012-07-19 DIAGNOSIS — Z0000 Encounter for general adult medical examination without abnormal findings: Secondary | ICD-10-CM

## 2012-07-19 DIAGNOSIS — R7301 Impaired fasting glucose: Secondary | ICD-10-CM

## 2012-07-19 MED ORDER — SPIRONOLACTONE-HCTZ 25-25 MG PO TABS: 1.0000 | ORAL_TABLET | Freq: Every day | ORAL | Status: DC

## 2012-07-19 MED ORDER — METOPROLOL SUCCINATE ER 50 MG PO TB24: 50.0000 mg | ORAL_TABLET | Freq: Every day | ORAL | Status: DC

## 2012-07-19 NOTE — Progress Notes (Signed)
Subjective:    Patient ID: Brenda Stephens, female    DOB: [birth year], 72 y.o.   MRN: [id number]  HPI Medicare Wellness Visit:  The following psychosocial & medical history were reviewed as required by Medicare.   Social history: caffeine: 1-2 cups coffee/ day , alcohol:  < 3 / week ,  tobacco use : quit 1964  & exercise : 3-4 hrs water aerobics/ week.   Home & personal  safety / fall risk: no issues, activities of daily living: nolimitations , seatbelt use :yes , and smoke alarm employment : yes .  Power of Attorney/Living Will status :in place  Vision ( as recorded per Nurse) & Hearing  evaluation :  Ophth exam current; no hearing exam. Orientation :oriented X3 , memory & recall :good, spelling  testing: good,and mood & affect : normal . Depression / anxiety: denied Travel history : 35 [place]  , immunization status :Shingles & PNA needed , transfusion history:  Yes 2000 post  GI surgery, and preventive health surveillance ( colonoscopies, BMD , etc as per protocol/ [facility]): colonoscopy current, Dental care: every 6 mos . Chart reviewed &  Updated. Active issues reviewed & addressed.       Review of Systems HYPERTENSION: Disease Monitoring: Blood pressure range-no monitor  Chest pain, palpitations- no       Dyspnea- no Medications: Compliance- yes Lightheadedness,Syncope-rare postural symptoms    Edema-intermittent  FASTING HYPERGLYCEMIA, PMH of: Disease Monitoring: Blood Sugar ranges-no monitor  Polyuria/phagia/dipsia- excess thirst      Visual problems- no Diet- none  HYPERLIPIDEMIA: Disease Monitoring: See symptoms for Hypertension Medications: Compliance- yes  Abd pain, bowel changes-no   Muscle aches- upper back/neck   She has had 3 courses of antibiotics for strep pharyngitis. She has residual discomfort in the right eustachian tube area. She also describes significant rhinitis.        Objective:   Physical Exam Gen.:  well-nourished in appearance. Alert,  appropriate and cooperative throughout exam.  Head: Normocephalic without obvious abnormalities Eyes: No corneal or conjunctival inflammation noted.  Extraocular motion intact. Vision grossly normal. Ears: External  ear exam reveals no significant lesions or deformities. Canals clear .TMs normal. Hearing is grossly normal bilaterally. Nose: External nasal exam reveals no deformity or inflammation. Nasal mucosa are pink and moist. No lesions or exudates noted.   Mouth: Oral mucosa and oropharynx reveal no lesions or exudates. Teeth in good repair. Neck: No deformities, masses, or tenderness noted. Range of motion decreased. Thyroid normal. Lungs: Normal respiratory effort; chest expands symmetrically. Lungs are clear to auscultation without rales, wheezes, or increased work of breathing. Heart: Normal rate and rhythm. Normal S1 ; accentuated S2. No gallop, click, or rub. No murmur. Abdomen: Bowel sounds normal; abdomen soft and nontender. No masses, organomegaly or hernias noted. Genitalia: Dr Seymour Bars                                    Musculoskeletal/extremities: No deformity or scoliosis noted of  the thoracic or lumbar spine. No clubbing, cyanosis, edema, or significant extremity  deformity noted. Range of motion normal .Tone & strength  normal.Joints normal . Nail health good. Able to lie down & sit up w/o help. Negative SLR bilaterally Vascular: Carotid, radial artery, dorsalis pedis and  posterior tibial pulses are full and equal. No bruits present. Neurologic: Alert and oriented x3. Deep tendon reflexes symmetrical and normal. Skin: Intact without suspicious  lesions or rashes. Lymph: No cervical, axillary lymphadenopathy present. Psych: Mood and affect are normal. Normally interactive                                                                                         Assessment & Plan:  #1 Medicare Wellness Exam; criteria met ; data entered #2 Problem List reviewed ; Assessment/  Recommendations made #3 R Eustachian tube dysfunction Plan: see Orders

## 2012-07-19 NOTE — Patient Instructions (Addendum)
Plain Mucinex (NOT D) for thick secretions ;force NON dairy fluids .   Nasal cleansing in the shower as discussed with lather of mild shampoo.After 10 seconds wash off lather while  exhaling through nostrils. Make sure that all residual soap is removed to prevent irritation.  Nasonex 1 spray in each nostril twice a day as needed. Use the "crossover" technique into opposite nostril spraying toward opposite ear @ 45 degree angle, not straight up into nostril.  Use a Neti pot daily only  as needed for significant sinus congestion; going from open side to congested side . Plain Allegra (NOT D )  160 daily , Loratidine 10 mg , OR Zyrtec 10 mg @ bedtime  as needed for itchy eyes & sneezing.    Go to [facility] for eustachian tube dysfunction. Drink thin  fluids liberally through the day  . Do the Valsalva maneuver several times a day to "pop" ears open.   Zicam Melts or Zinc lozenges  as needed for sore throat for 4-7 days. Report fever, exudate("pus") or progressive face pain.

## 2012-07-20 LAB — CBC WITH DIFFERENTIAL/PLATELET
Basophils Relative: 0.9 % (ref 0.0–3.0)
Eosinophils Absolute: 0.1 10*3/uL (ref 0.0–0.7)
Eosinophils Relative: 1.9 % (ref 0.0–5.0)
HCT: 40.4 % (ref 36.0–46.0)
Hemoglobin: 13.6 g/dL (ref 12.0–15.0)
MCHC: 33.7 g/dL (ref 30.0–36.0)
MCV: 91.6 fl (ref 78.0–100.0)
Monocytes Absolute: 0.7 10*3/uL (ref 0.1–1.0)
Neutro Abs: 3.8 10*3/uL (ref 1.4–7.7)
Neutrophils Relative %: 61.8 % (ref 43.0–77.0)
RBC: 4.41 Mil/uL (ref 3.87–5.11)
WBC: 6.1 10*3/uL (ref 4.5–10.5)

## 2012-07-20 LAB — BASIC METABOLIC PANEL
CO2: 26 mEq/L (ref 19–32)
Chloride: 100 mEq/L (ref 96–112)
Potassium: 4.2 mEq/L (ref 3.5–5.1)
Sodium: 134 mEq/L — ABNORMAL LOW (ref 135–145)

## 2012-07-20 LAB — LDL CHOLESTEROL, DIRECT: Direct LDL: 108.4 mg/dL

## 2012-07-20 LAB — LIPID PANEL
HDL: 70.3 mg/dL (ref >39.00)
Triglycerides: 152 mg/dL — ABNORMAL HIGH (ref 0.0–149.0)

## 2012-07-20 LAB — HEPATIC FUNCTION PANEL
ALT: 19 U/L (ref 0–35)
Albumin: 4.2 g/dL (ref 3.5–5.2)
Bilirubin, Direct: 0.1 mg/dL (ref 0.0–0.3)
Total Protein: 7.3 g/dL (ref 6.0–8.3)

## 2012-07-20 LAB — TSH: TSH: 1.17 u[IU]/mL (ref 0.35–5.50)

## 2012-08-05 ENCOUNTER — Other Ambulatory Visit: Payer: Self-pay | Admitting: Internal Medicine

## 2012-08-06 NOTE — Telephone Encounter (Signed)
Patient aware Controlled Substance Contract to be sign and rx to be picked up   

## 2012-08-30 ENCOUNTER — Other Ambulatory Visit: Payer: Self-pay | Admitting: *Deleted

## 2012-08-30 MED ORDER — ATORVASTATIN CALCIUM 20 MG PO TABS: ORAL_TABLET | ORAL | Status: DC

## 2012-09-09 ENCOUNTER — Encounter: Payer: Self-pay | Admitting: Internal Medicine

## 2012-09-14 ENCOUNTER — Other Ambulatory Visit: Payer: Self-pay | Admitting: Orthopedic Surgery

## 2012-09-14 DIAGNOSIS — M79671 Pain in right foot: Secondary | ICD-10-CM

## 2012-09-14 DIAGNOSIS — M79672 Pain in left foot: Secondary | ICD-10-CM

## 2012-09-20 ENCOUNTER — Other Ambulatory Visit: Payer: Self-pay | Admitting: Internal Medicine

## 2012-09-20 ENCOUNTER — Ambulatory Visit
Admission: RE | Admit: 2012-09-20 | Discharge: 2012-09-20 | Disposition: A | Discharge status: Home / Self Care | Payer: MEDICARE | Source: Ambulatory Visit | Attending: Orthopedic Surgery | Admitting: Orthopedic Surgery

## 2012-09-20 DIAGNOSIS — M79671 Pain in right foot: Secondary | ICD-10-CM

## 2012-09-20 DIAGNOSIS — M79672 Pain in left foot: Secondary | ICD-10-CM

## 2012-09-21 ENCOUNTER — Encounter: Payer: Self-pay | Admitting: Adult Health

## 2012-09-21 ENCOUNTER — Telehealth: Payer: Self-pay | Admitting: Internal Medicine

## 2012-09-21 ENCOUNTER — Ambulatory Visit (INDEPENDENT_AMBULATORY_CARE_PROVIDER_SITE_OTHER): Payer: MEDICARE | Admitting: Adult Health

## 2012-09-21 VITALS — BP 130/82 | HR 65 | Temp 97.8°F | Ht 63.0 in | Wt 183.0 lb

## 2012-09-21 DIAGNOSIS — J309 Allergic rhinitis, unspecified: Secondary | ICD-10-CM

## 2012-09-21 MED ORDER — PREDNISONE 10 MG PO TABS: ORAL_TABLET | ORAL | Status: DC

## 2012-09-21 NOTE — Progress Notes (Signed)
Subjective:    Patient ID: Brenda Stephens, female    DOB: [birth year], 72 y.o.   MRN: [id number]  HPI 72 yo female with known hx of AR.   February 13, 2010 2:01 PMAcute OV CDY patient.Pt seen by CDY 01/31/10 rx pred pulse and taper. see hx above and pt concurs is accurate. After the prednisone was given the cough subsided significantly but still with persisten R ear pain. Also notes ear feels full. Sinuses are not stopped up. There is no thick secretions seen. There is less dyspnea. The pt completed course of prednisone. No ABX given.  She remains off symbicort and singulair. She was to have RAST assay with Dr Maple Hudson but not done due to EMR being down on 01/31/10.  She has seen Dr Annalee Genta in the past of ENT.  March 14, 2010-  Seen by Dr Delford Field c/o pain R ear> Dr Lazarus Salines 8/19 > Cerumen removal didn't help. C/O if blows nose she feels "waves" in right ear. Original c/o cough is better- less deep and no longer daily or productive. In last 2-3 days more rhinorhea and postnasal drip, and still uncomfortable in right ear. Denies chest tightness, wheeze or dyspnea. No fever, no nasal congestion or discharge.  Allergy profile- elevated cat (daughter has cats). Total IgE 5.5  Sed rate 39, EOS 1.2%  May 30, 2010- Cough/ allergy  Nurse-CC: 2 month follow up visit-still having heaviness on chest with activiity. Denies any wheezing.  Pending left shoulder replacement next week by Dr Eulah Pont. She continues to notice substernal heaviness that was not clarified by cardiac testing in the Spring, done in [place], including treadmill stress test and echocardiogram. . Occasional dry cough. Denies wheeze, palpitation, dysphagia or dyspnea.   Last seen by Dr. Maple Hudson  05/2010   09/21/2012 Acute OV  Complains of recurrent sinus infections x5 months. Finished levaquin 1 week ago by ENT.   Has watery right eye, sneezing, head congestion w/ yellow/white mucus, runny nose, sinus pressure, sore throat x5days. Congestion is  mainly clear.  Has been on several abx . Over last few months.  Watery eyes , drippy nose  2 dogs/cats inside  Nasonex helps some.  No antihistamines have been used.  Post nasal drip is getting worse.  Dry ticking cough -rare  No chest pain, edema or n/v. No orthopnea.   Review of Systems Constitutional:   No  weight loss, night sweats,  Fevers, chills, fatigue, or  lassitude.  HEENT:   No headaches,  Difficulty swallowing,  Tooth/dental problems, or  Sore throat,                No sneezing, itching, ear ache, nasal congestion, post nasal drip,   CV:  No chest pain,  Orthopnea, PND, swelling in lower extremities, anasarca, dizziness, palpitations, syncope.   GI  No heartburn, indigestion, abdominal pain, nausea, vomiting, diarrhea, change in bowel habits, loss of appetite, bloody stools.   Resp: No shortness of breath with exertion or at rest.  No excess mucus, no productive cough,  No non-productive cough,  No coughing up of blood.  No change in color of mucus.  No wheezing.  No chest wall deformity  Skin: no rash or lesions.  GU: no dysuria, change in color of urine, no urgency or frequency.  No flank pain, no hematuria   MS:  No joint pain or swelling.  No decreased range of motion.  No back pain.  Psych:  No change in mood or affect. No  depression or anxiety.  No memory loss.         Objective:   Physical Exam  GEN: A/Ox3; pleasant , NAD, well nourished   HEENT:  [place]/AT,  EACs-clear, TMs-wnl, NOSE-clear drainage , THROAT-clear, no lesions, no postnasal drip or exudate noted. nontender sinus   NECK:  Supple w/ fair ROM; no JVD; normal carotid impulses w/o bruits; no thyromegaly or nodules palpated; no lymphadenopathy.  RESP  Clear  P & A; w/o, wheezes/ rales/ or rhonchi.no accessory muscle use, no dullness to percussion  CARD:  RRR, no m/r/g  , no peripheral edema, pulses intact, no cyanosis or clubbing.  GI:   Soft & nt; nml bowel sounds; no organomegaly or masses  detected.  Musco: Warm bil, no deformities or joint swelling noted.   Neuro: alert, no focal deficits noted.    Skin: Warm, no lesions or rashes        Assessment & Plan:

## 2012-09-21 NOTE — Patient Instructions (Addendum)
Prednisone taper over the next week. Increase Nasonex 2 puffs twice daily. Saline nasal rinses as needed. Begin Allegra 180 mg daily follow up Dr. Maple Hudson  In 6 weeks and As needed   Please contact office for sooner follow up if symptoms do not improve or worsen or seek emergency care

## 2012-09-21 NOTE — Telephone Encounter (Signed)
Pt last OV 05/30/2010. Per Florentina Addison pt can come in and see TP. She is coming in at 3:45. Nothing further was needed

## 2012-09-22 ENCOUNTER — Encounter: Payer: Self-pay | Admitting: Internal Medicine

## 2012-09-24 NOTE — Assessment & Plan Note (Signed)
Flare   Plan  Prednisone taper over the next week. Increase Nasonex 2 puffs twice daily. Saline nasal rinses as needed. Begin Allegra 180 mg daily follow up Dr. Maple Hudson  In 6 weeks and As needed   Please contact office for sooner follow up if symptoms do not improve or worsen or seek emergency care

## 2012-09-27 ENCOUNTER — Other Ambulatory Visit: Payer: Self-pay | Admitting: Internal Medicine

## 2012-10-01 ENCOUNTER — Encounter (HOSPITAL_BASED_OUTPATIENT_CLINIC_OR_DEPARTMENT_OTHER): Payer: Self-pay | Admitting: *Deleted

## 2012-10-01 NOTE — Progress Notes (Signed)
Pt has had multiple surgeries-had a stress test 10/13-ef 72% ekg done-to come in for bmet

## 2012-10-04 ENCOUNTER — Encounter (HOSPITAL_BASED_OUTPATIENT_CLINIC_OR_DEPARTMENT_OTHER)
Admission: RE | Admit: 2012-10-04 | Discharge: 2012-10-04 | Disposition: A | Discharge status: Home / Self Care | Payer: MEDICARE | Source: Ambulatory Visit | Attending: Orthopedic Surgery | Admitting: Orthopedic Surgery

## 2012-10-04 LAB — BASIC METABOLIC PANEL
BUN: 11 mg/dL (ref 6–23)
Chloride: 97 mEq/L (ref 96–112)
GFR calc Af Amer: >90 mL/min (ref >90)
GFR calc non Af Amer: 83 mL/min — ABNORMAL LOW (ref >90)
Potassium: 3.5 mEq/L (ref 3.5–5.1)
Sodium: 133 mEq/L — ABNORMAL LOW (ref 135–145)

## 2012-10-06 NOTE — H&P (Signed)
Devetta Hagenow/[facility] [address]   [address] [place], [place] [address] [phone] A Division of [facility]  Loreta Ave, M.D.   Robert A. Thurston Hole, M.D.   Burnell Blanks, M.D.   Eulas Post, M.D.   Lunette Stands, M.D Buford Dresser, M.D.  Charlsie Quest, M.D.   Estell Harpin, M.D.   Melina Fiddler, M.D. Genene Churn. Barry Dienes, PA-C            Kirstin A. Shepperson, PA-C Josh [place], PA-C [place], [place]  RE: Brenda Stephens, Brenda Stephens   [phone]      DOB: 17-Nov-1940 PROGRESS NOTE: 05-25-12 [name] comes in for bilateral heel pain right greater than left. 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 This was evaluated in April and September of this year. Previously the left was more symptomatic and she was treated with pre-Achilles bursa injection which helped a great deal for a number of month and is still providing some relief. She comes in because the right side is worse than the left. Right was evaluated with plantar fasciitis in the past. Current symptoms are more posterior heel. Tight and stiff, difficult walking after prolonged sitting. Although she's had issues with her back current radicular complaints are not paramount. Remaining history and general exam is outlined and included in the chart.   EXAMINATION: She has tightness of Achilles on both sides not extreme. A little plantar fascia soreness right greater than left not extreme. On the left she's sore over the Achilles and pre-Achilles bursa with Haglund's deformity but not as much as before. Achilles is intact. On the right she has pre-Achilles bursitis and Haglund deformity. The Achilles is a little sore and full but functionally intact.   DISPOSITION: I have reinforced the importance of continuing her Achilles stretching program. Today we'll try an injection in the right pre-Achilles bursa. In the  long-run if symptoms continue workup would include MRI and excision of pre-Achilles bursa Haglund's deformity debridement and repair of Achilles tendon on both sides. I don't think this is a pressing issue.  PROCEDURE: The patient's clinical condition is marked by substantial pain and/or significant functional disability. Other conservative therapy has not provided relief, is contraindicated, or not appropriate. There is a reasonable likelihood that injection will significantly improve the patient's pain and/or functional disability.   After routine sterile prep with use of ethyl chloride and 1% plain Xylocaine pre-Achilles bursal on the right  is injected with Depo-Medrol/Marcaine, accomplished atraumatically.  Patient tolerated the procedure well.  Loreta Ave, M.D.  Electronically verified by Loreta Ave, M.D. DFM:kh D 05-25-12 T 05-26-12  Glyn Gerads/[facility] [address]   [address] [place], [place] [address] [phone] A Division of [facility]  Loreta Ave, M.D.   Robert A. Thurston Hole, M.D.   Burnell Blanks, M.D.   Eulas Post, M.D.   Lunette Stands, M.D Buford Dresser, M.D.  Charlsie Quest, M.D.   Estell Harpin, M.D.   Melina Fiddler, M.D. Genene Churn. Barry Dienes, PA-C            Kirstin A. Shepperson, PA-C Josh [place], PA-C [place], [place]   RE: Brenda, Stephens                                [phone]  DOB: 15-Apr-1941 PROGRESS NOTE: 09-14-12 66 one year-old white female with a history of bilateral posterior heel pain.  Returns.  She has had multiple injections for bilateral pre Achilles bursitis.  Last seen in the office for the problem on May 25, 2012 and advised if she continued to be symptomatic the next step would be getting MRI scans.  She continues to have ongoing bilateral heel pain and swelling with activity.  Pain does limit her activities.    EXAMINATION: Pleasant white female,  alert and oriented x 3 and in no acute distress.  She does have some swelling of bilateral posterior heels.  Moderate to markedly tender over the pre Achilles bursa and Achilles calcaneal attachment site.  Neurovascularly intact.  Skin warm and dry.   DISPOSITION:  We will schedule MRI scans of bilateral ankles to rule out Achilles tendon tears.  Follow up in the office after completion to discuss results and treatment options.    Loreta Ave, M.D.   Electronically verified by Loreta Ave, M.D. DFM(JMO):jjh D 09-14-12 T 09-15-12  Roshon Duell/[facility] [address]   [address] [place], [place] [address] [phone] A Division of [facility]  Loreta Ave, M.D.   Robert A. Thurston Hole, M.D.   Burnell Blanks, M.D.   Eulas Post, M.D.   Lunette Stands, M.D Buford Dresser, M.D.  Charlsie Quest, M.D.   Estell Harpin, M.D.   Melina Fiddler, M.D. Genene Churn. Barry Dienes, PA-C            Kirstin A. Shepperson, PA-C Josh [place], PA-C [place], [place]   RE: [place], Brenda Stephens                                [phone]      DOB: September 30, 1940 PROGRESS NOTE: 09-24-12 [name] comes in for follow up with her husband.  I have gone over the scans of both of her ankles and heels.  The right side is the worst.  She has a prominent posterior superior os calcis.  Pre Achilles bursitis.  Moderate interstitial tendinosis.  A small area of partial tearing at the calcaneal attachment.  Calcification in that area as well.  No full thickness tears, but I did not expect that.   Also reviewed findings on the left.  Essentially the exact same picture, but not as much interstitial tearing and more tendinosis there.    DISPOSITION:  More than 25 minutes spent face-to-face discussing scans, workup and treatment to date with her husband and herself.  Pre Achilles bursal injection helpful, but only for a very short time.  Symptoms have been going on now  almost a year.  More and more functional impact.  She would like to pursue definitive treatment.  She realizes that more medicine, more shots and orthotics are really not going to help matters.  We have discussed doing both sides, the right side first.  Posterior approach.  Excision of pump bump pre Achilles bursa.  Debridement of Achilles tendon and then a repair with an Arthrex double row technique.  Procedure, risks, benefits and complications reviewed in detail Anticipated rehab and recovery thoroughly outlined.  I have told her that in most people it is somewhere between 6-12 weeks after the first side before we can get to the second side.  All questions answered.  All paperwork complete.  I will see her at the  time of operative intervention.     Loreta Ave, M.D.   Electronically verified by Loreta Ave, M.D. DFM:jjh D 09-24-12 T 09-27-12

## 2012-10-07 ENCOUNTER — Ambulatory Visit (HOSPITAL_BASED_OUTPATIENT_CLINIC_OR_DEPARTMENT_OTHER): Payer: MEDICARE | Admitting: Anesthesiology

## 2012-10-07 ENCOUNTER — Encounter (HOSPITAL_BASED_OUTPATIENT_CLINIC_OR_DEPARTMENT_OTHER): Payer: Self-pay | Admitting: Anesthesiology

## 2012-10-07 ENCOUNTER — Encounter (HOSPITAL_BASED_OUTPATIENT_CLINIC_OR_DEPARTMENT_OTHER)
Admission: RE | Disposition: A | Discharge status: Home / Self Care | Payer: Self-pay | Source: Ambulatory Visit | Attending: Orthopedic Surgery

## 2012-10-07 ENCOUNTER — Ambulatory Visit (HOSPITAL_BASED_OUTPATIENT_CLINIC_OR_DEPARTMENT_OTHER)
Admission: RE | Admit: 2012-10-07 | Discharge: 2012-10-07 | Disposition: A | Discharge status: Home / Self Care | Payer: MEDICARE | Source: Ambulatory Visit | Attending: Orthopedic Surgery | Admitting: Orthopedic Surgery

## 2012-10-07 DIAGNOSIS — M7730 Calcaneal spur, unspecified foot: Secondary | ICD-10-CM | POA: Insufficient documentation

## 2012-10-07 DIAGNOSIS — M7660 Achilles tendinitis, unspecified leg: Secondary | ICD-10-CM | POA: Insufficient documentation

## 2012-10-07 DIAGNOSIS — M66369 Spontaneous rupture of flexor tendons, unspecified lower leg: Secondary | ICD-10-CM | POA: Insufficient documentation

## 2012-10-07 DIAGNOSIS — Z9889 Other specified postprocedural states: Secondary | ICD-10-CM

## 2012-10-07 HISTORY — PX: ACHILLES TENDON SURGERY: SHX542

## 2012-10-07 HISTORY — PX: EAR CYST EXCISION: SHX22

## 2012-10-07 HISTORY — DX: Unspecified osteoarthritis, unspecified site: M19.90

## 2012-10-07 LAB — POCT HEMOGLOBIN-HEMACUE: Hemoglobin: 13.3 g/dL (ref 12.0–15.0)

## 2012-10-07 SURGERY — REPAIR, TENDON, ACHILLES
Anesthesia: General | Site: Foot | Laterality: Right | Wound class: Clean

## 2012-10-07 MED ORDER — PROMETHAZINE HCL 25 MG/ML IJ SOLN: 6.2500 mg | INTRAMUSCULAR | Status: DC | PRN

## 2012-10-07 MED ORDER — GLYCOPYRROLATE 0.2 MG/ML IJ SOLN
INTRAMUSCULAR | Status: DC | PRN
  Administered 2012-10-07: 0.2 mg via INTRAVENOUS

## 2012-10-07 MED ORDER — ONDANSETRON HCL 4 MG/2ML IJ SOLN
INTRAMUSCULAR | Status: DC | PRN
  Administered 2012-10-07: 4 mg via INTRAVENOUS

## 2012-10-07 MED ORDER — PROPOFOL 10 MG/ML IV BOLUS
INTRAVENOUS | Status: DC | PRN
  Administered 2012-10-07: 150 mg via INTRAVENOUS
  Administered 2012-10-07: 50 mg via INTRAVENOUS
  Administered 2012-10-07: 30 mg via INTRAVENOUS

## 2012-10-07 MED ORDER — SUCCINYLCHOLINE CHLORIDE 20 MG/ML IJ SOLN
INTRAMUSCULAR | Status: DC | PRN
  Administered 2012-10-07: 100 mg via INTRAVENOUS

## 2012-10-07 MED ORDER — FENTANYL CITRATE 0.05 MG/ML IJ SOLN
50.0000 ug | INTRAMUSCULAR | Status: DC | PRN
  Administered 2012-10-07: 100 ug via INTRAVENOUS

## 2012-10-07 MED ORDER — MIDAZOLAM HCL 2 MG/2ML IJ SOLN
1.0000 mg | INTRAMUSCULAR | Status: DC | PRN
  Administered 2012-10-07: 2 mg via INTRAVENOUS

## 2012-10-07 MED ORDER — LACTATED RINGERS IV SOLN
INTRAVENOUS | Status: DC
  Administered 2012-10-07: 08:00:00 via INTRAVENOUS

## 2012-10-07 MED ORDER — MEPERIDINE HCL 25 MG/ML IJ SOLN: 6.2500 mg | INTRAMUSCULAR | Status: DC | PRN

## 2012-10-07 MED ORDER — CEFAZOLIN SODIUM-DEXTROSE 2-3 GM-% IV SOLR
2.0000 g | INTRAVENOUS | Status: AC
  Administered 2012-10-07: 2 g via INTRAVENOUS

## 2012-10-07 MED ORDER — EPHEDRINE SULFATE 50 MG/ML IJ SOLN
INTRAMUSCULAR | Status: DC | PRN
  Administered 2012-10-07: 15 mg via INTRAVENOUS

## 2012-10-07 MED ORDER — LIDOCAINE HCL (CARDIAC) 20 MG/ML IV SOLN
INTRAVENOUS | Status: DC | PRN
  Administered 2012-10-07: 50 mg via INTRAVENOUS

## 2012-10-07 MED ORDER — HYDROMORPHONE HCL 2 MG PO TABS: 2.0000 mg | ORAL_TABLET | ORAL | Status: DC | PRN

## 2012-10-07 MED ORDER — FENTANYL CITRATE 0.05 MG/ML IJ SOLN
INTRAMUSCULAR | Status: DC | PRN
  Administered 2012-10-07: 25 ug via INTRAVENOUS

## 2012-10-07 MED ORDER — HYDROMORPHONE HCL PF 1 MG/ML IJ SOLN: 0.2500 mg | INTRAMUSCULAR | Status: DC | PRN

## 2012-10-07 MED ORDER — BUPIVACAINE-EPINEPHRINE PF 0.5-1:200000 % IJ SOLN
INTRAMUSCULAR | Status: DC | PRN
  Administered 2012-10-07: 30 mL

## 2012-10-07 SURGICAL SUPPLY — 84 items
BANDAGE ELASTIC 3 VELCRO ST LF (GAUZE/BANDAGES/DRESSINGS) ×1 IMPLANT
BANDAGE ELASTIC 4 VELCRO ST LF (GAUZE/BANDAGES/DRESSINGS) ×3 IMPLANT
BANDAGE ELASTIC 6 VELCRO ST LF (GAUZE/BANDAGES/DRESSINGS) ×2 IMPLANT
BANDAGE ESMARK 6X9 LF (GAUZE/BANDAGES/DRESSINGS) ×2 IMPLANT
BLADE MINI RND TIP GREEN BEAV (BLADE) ×1 IMPLANT
BLADE SURG 15 STRL LF DISP TIS (BLADE) ×4 IMPLANT
BLADE SURG 15 STRL SS (BLADE) ×6
BNDG CMPR 9X4 STRL LF SNTH (GAUZE/BANDAGES/DRESSINGS)
BNDG CMPR 9X6 STRL LF SNTH (GAUZE/BANDAGES/DRESSINGS) ×2
BNDG CMPR MD 5X2 ELC HKLP STRL (GAUZE/BANDAGES/DRESSINGS)
BNDG COHESIVE 4X5 TAN STRL (GAUZE/BANDAGES/DRESSINGS) ×3 IMPLANT
BNDG ELASTIC 2 VLCR STRL LF (GAUZE/BANDAGES/DRESSINGS) IMPLANT
BNDG ESMARK 4X9 LF (GAUZE/BANDAGES/DRESSINGS) IMPLANT
BNDG ESMARK 6X9 LF (GAUZE/BANDAGES/DRESSINGS) ×3
CANISTER SUCTION 1200CC (MISCELLANEOUS) ×1 IMPLANT
CLOTH BEACON ORANGE TIMEOUT ST (SAFETY) ×3 IMPLANT
CORDS BIPOLAR (ELECTRODE) ×2 IMPLANT
COVER MAYO STAND STRL (DRAPES) ×2 IMPLANT
COVER TABLE BACK 60X90 (DRAPES) ×3 IMPLANT
CUFF TOURNIQUET SINGLE 18IN (TOURNIQUET CUFF) IMPLANT
CUFF TOURNIQUET SINGLE 34IN LL (TOURNIQUET CUFF) ×1 IMPLANT
DECANTER SPIKE VIAL GLASS SM (MISCELLANEOUS) ×2 IMPLANT
DRAPE EXTREMITY T 121X128X90 (DRAPE) ×3 IMPLANT
DRAPE OEC MINIVIEW 54X84 (DRAPES) ×1 IMPLANT
DRAPE U 20/CS (DRAPES) ×3 IMPLANT
DRAPE U-SHAPE 47X51 STRL (DRAPES) ×3 IMPLANT
DRSG PAD ABDOMINAL 8X10 ST (GAUZE/BANDAGES/DRESSINGS) ×4 IMPLANT
DURAPREP 26ML APPLICATOR (WOUND CARE) ×3 IMPLANT
ELECT REM PT RETURN 9FT ADLT (ELECTROSURGICAL) ×3
ELECTRODE REM PT RTRN 9FT ADLT (ELECTROSURGICAL) ×2 IMPLANT
GAUZE XEROFORM 1X8 LF (GAUZE/BANDAGES/DRESSINGS) ×3 IMPLANT
GLOVE BIOGEL PI IND STRL 7.5 (GLOVE) IMPLANT
GLOVE BIOGEL PI IND STRL 8 (GLOVE) ×2 IMPLANT
GLOVE BIOGEL PI INDICATOR 7.5 (GLOVE) ×1
GLOVE BIOGEL PI INDICATOR 8 (GLOVE) ×2
GLOVE INDICATOR 7.0 STRL GRN (GLOVE) ×2 IMPLANT
GLOVE ORTHO TXT STRL SZ7.5 (GLOVE) ×4 IMPLANT
GOWN BRE IMP PREV XXLGXLNG (GOWN DISPOSABLE) ×3 IMPLANT
GOWN PREVENTION PLUS XLARGE (GOWN DISPOSABLE) ×4 IMPLANT
IMPL SYS BIOCOMP ACH SPEED (Anchor) IMPLANT
IMPLANT SYS BIOCOMP ACH SPEED (Anchor) ×3 IMPLANT
NDL 1/2 CIR CATGUT .05X1.09 (NEEDLE) IMPLANT
NDL HYPO 25X1 1.5 SAFETY (NEEDLE) ×2 IMPLANT
NEEDLE 1/2 CIR CATGUT .05X1.09 (NEEDLE) IMPLANT
NEEDLE HYPO 22GX1.5 SAFETY (NEEDLE) IMPLANT
NEEDLE HYPO 25X1 1.5 SAFETY (NEEDLE) IMPLANT
NS IRRIG 1000ML POUR BTL (IV SOLUTION) ×3 IMPLANT
PACK BASIN DAY SURGERY FS (CUSTOM PROCEDURE TRAY) ×3 IMPLANT
PAD CAST 3X4 CTTN HI CHSV (CAST SUPPLIES) IMPLANT
PAD CAST 4YDX4 CTTN HI CHSV (CAST SUPPLIES) ×4 IMPLANT
PADDING CAST ABS 4INX4YD NS (CAST SUPPLIES) ×2
PADDING CAST ABS COTTON 4X4 ST (CAST SUPPLIES) ×4 IMPLANT
PADDING CAST COTTON 3X4 STRL (CAST SUPPLIES)
PADDING CAST COTTON 4X4 STRL (CAST SUPPLIES) ×6
PADDING CAST COTTON 6X4 STRL (CAST SUPPLIES) ×2 IMPLANT
PADDING UNDERCAST 2  STERILE (CAST SUPPLIES) IMPLANT
PENCIL BUTTON HOLSTER BLD 10FT (ELECTRODE) ×3 IMPLANT
SLEEVE SCD COMPRESS KNEE MED (MISCELLANEOUS) IMPLANT
SPLINT FAST PLASTER 5X30 (CAST SUPPLIES) ×5
SPLINT FIBERGLASS 3X35 (CAST SUPPLIES) ×1 IMPLANT
SPLINT FIBERGLASS 4X30 (CAST SUPPLIES) IMPLANT
SPLINT PLASTER CAST FAST 5X30 (CAST SUPPLIES) ×10 IMPLANT
SPONGE GAUZE 4X4 12PLY (GAUZE/BANDAGES/DRESSINGS) ×3 IMPLANT
STAPLER VISISTAT 35W (STAPLE) IMPLANT
STOCKINETTE 4X48 STRL (DRAPES) ×3 IMPLANT
STOCKINETTE 6  STRL (DRAPES) ×1
STOCKINETTE 6 STRL (DRAPES) ×2 IMPLANT
SUCTION FRAZIER TIP 10 FR DISP (SUCTIONS) IMPLANT
SUT ETHILON 3 0 PS 1 (SUTURE) ×3 IMPLANT
SUT FIBERWIRE #2 38 T-5 BLUE (SUTURE)
SUT VIC AB 0 CT1 27 (SUTURE) ×3
SUT VIC AB 0 CT1 27XBRD ANBCTR (SUTURE) ×2 IMPLANT
SUT VIC AB 2-0 SH 27 (SUTURE)
SUT VIC AB 2-0 SH 27XBRD (SUTURE) IMPLANT
SUT VIC AB 3-0 SH 27 (SUTURE) ×3
SUT VIC AB 3-0 SH 27X BRD (SUTURE) ×2 IMPLANT
SUTURE FIBERWR #2 38 T-5 BLUE (SUTURE) IMPLANT
SYR BULB 3OZ (MISCELLANEOUS) ×3 IMPLANT
SYR CONTROL 10ML LL (SYRINGE) ×2 IMPLANT
TOWEL OR 17X24 6PK STRL BLUE (TOWEL DISPOSABLE) ×6 IMPLANT
TOWEL OR NON WOVEN STRL DISP B (DISPOSABLE) ×4 IMPLANT
TUBE CONNECTING 20X1/4 (TUBING) IMPLANT
UNDERPAD 30X30 INCONTINENT (UNDERPADS AND DIAPERS) ×3 IMPLANT
YANKAUER SUCT BULB TIP NO VENT (SUCTIONS) ×3 IMPLANT

## 2012-10-07 NOTE — Interval H&P Note (Signed)
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 After consideration of risks, benefits and other options for treatment, the patient has consented to  Procedure(s): RIGHT REPAIR RUPTURE TENDON PRIMARY OPEN/PERCUTANEOUS  (Right) EXCISION BONE CYST BENIGN TUMOR TALUS /CALCANEUS  (N/A) as a surgical intervention .  The patient's history has been reviewed, patient examined, no change in status, stable for surgery.  I have reviewed the patient's chart and labs.  Questions were answered to the patient's satisfaction.     Cid Agena F

## 2012-10-07 NOTE — Anesthesia Preprocedure Evaluation (Signed)
Anesthesia Evaluation  Patient identified by MRN, date of birth, ID band Patient awake    Reviewed: Allergy & Precautions, H&P , NPO status , Patient's Chart, lab work & pertinent test results  History of Anesthesia Complications Negative for: history of anesthetic complications  Airway Mallampati: I  Neck ROM: full    Dental no notable dental hx. (+) Teeth Intact   Pulmonary neg pulmonary ROS,    Pulmonary exam normal       Cardiovascular hypertension, IRhythm:regular Rate:Normal     Neuro/Psych negative neurological ROS  negative psych ROS   GI/Hepatic Neg liver ROS, GERD-  ,  Endo/Other  Hypothyroidism   Renal/GU negative Renal ROS  negative genitourinary   Musculoskeletal   Abdominal   Peds  Hematology negative hematology ROS (+)   Anesthesia Other Findings   Reproductive/Obstetrics negative OB ROS                           Anesthesia Physical Anesthesia Plan  ASA: II  Anesthesia Plan: General   Post-op Pain Management: MAC Combined w/ Regional for Post-op pain   Induction:   Airway Management Planned:   Additional Equipment:   Intra-op Plan:   Post-operative Plan:   Informed Consent: I have reviewed the patients History and Physical, chart, labs and discussed the procedure including the risks, benefits and alternatives for the proposed anesthesia with the patient or authorized representative who has indicated his/her understanding and acceptance.     Plan Discussed with: CRNA and Surgeon  Anesthesia Plan Comments:         Anesthesia Quick Evaluation

## 2012-10-07 NOTE — Transfer of Care (Signed)
Immediate Anesthesia Transfer of Care Note  Patient: ARIELE VIDRIO  Procedure(s) Performed: Procedure(s): RIGHT REPAIR RUPTURE TENDON PRIMARY OPEN/PERCUTANEOUS  (Right) EXCISION BONE CYST BENIGN TUMOR TALUS /CALCANEUS  (Right)  Patient Location: PACU  Anesthesia Type:General  Level of Consciousness: awake and sedated  Airway & Oxygen Therapy: Patient Spontanous Breathing and Patient connected to face mask oxygen  Post-op Assessment: Report given to PACU RN and Post -op Vital signs reviewed and stable  Post vital signs: Reviewed and stable  Complications: No apparent anesthesia complications

## 2012-10-07 NOTE — Brief Op Note (Signed)
10/07/2012  10:06 AM  PATIENT:  Oneida Arenas  72 y.o. 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

## 2012-10-07 NOTE — Anesthesia Procedure Notes (Addendum)
Anesthesia Regional Block:  Popliteal block  Pre-Anesthetic Checklist: ,, timeout performed, Correct Patient, Correct Site, Correct Laterality, Correct Procedure, Correct Position, site marked, Risks and benefits discussed, Surgical consent,  At surgeon's request and post-op pain management  Laterality: Upper and Right  Prep: chloraprep       Needles:   Needle Type: Echogenic Needle        Needle insertion depth: 4 cm   Additional Needles:  Procedures: Doppler guided and ultrasound guided (picture in chart) Popliteal block Narrative:  Start time: 10/07/2012 7:40 AM End time: 10/07/2012 7:55 AM Injection made incrementally with aspirations every 5 mL.  Performed by: Personally  Anesthesiologist: T Massagee  Additional Notes: Tolerated well   Procedure Name: Intubation Performed by: York Grice Pre-anesthesia Checklist: Patient identified, Emergency Drugs available, Suction available and Patient being monitored Patient Re-evaluated:Patient Re-evaluated prior to inductionOxygen Delivery Method: Circle system utilized Preoxygenation: Pre-oxygenation with 100% oxygen Intubation Type: IV induction Ventilation: Mask ventilation without difficulty Laryngoscope Size: Miller and 2 Grade View: Grade III Tube type: Oral Tube size: 7.0 mm Number of attempts: 1 Airway Equipment and Method: Stylet Placement Confirmation: breath sounds checked- equal and bilateral and positive ETCO2 Secured at: 19 cm Tube secured with: Tape Dental Injury: Teeth and Oropharynx as per pre-operative assessment

## 2012-10-07 NOTE — Anesthesia Postprocedure Evaluation (Signed)
  Anesthesia Post-op Note  Patient: Brenda Stephens  Procedure(s) Performed: Procedure(s): RIGHT REPAIR RUPTURE TENDON PRIMARY OPEN/PERCUTANEOUS  (Right) EXCISION BONE CYST BENIGN TUMOR TALUS /CALCANEUS  (Right)  Patient Location: PACU  Anesthesia Type:GA combined with regional for post-op pain  Level of Consciousness: awake and alert   Airway and Oxygen Therapy: Patient Spontanous Breathing  Post-op Pain: none  Post-op Assessment: Post-op Vital signs reviewed  Post-op Vital Signs: stable  Complications: No apparent anesthesia complications

## 2012-10-07 NOTE — Progress Notes (Signed)
Assisted Dr. Massagee with right, ultrasound guided, popliteal block. Side rails up, monitors on throughout procedure. See vital signs in flow sheet. Tolerated Procedure well. 

## 2012-10-08 NOTE — Op Note (Signed)
NAMEMarland Kitchen  AURIANA, SCALIA NO.:  [id number]  MEDICAL RECORD NO.:  [id number]  LOCATION:                                 FACILITY:  PHYSICIAN:  Loreta Ave, M.D. DATE OF BIRTH:  1941/05/25  DATE OF PROCEDURE:  10/07/2012 DATE OF DISCHARGE:                              OPERATIVE REPORT   PREOPERATIVE DIAGNOSES: 1. Right heel extensive interstitial tearing, degeneration Achilles     tendon with osteochondral loose bodies. 2. Prominent pump-bump and pre-Achilles bursitis.  POSTOPERATIVE DIAGNOSES: 1. Right heel extensive interstitial tearing, degeneration Achilles     tendon with osteochondral loose bodies. 2. Prominent pump-bump and pre-Achilles bursitis.  Postop diagnose     same.  PROCEDURES: 1. Right heel exploration. 2. Debridement of Achilles tendon with primary repair reattachment     utilizing Arthrex SpeedBridge technique. 3. Fiber weave suture x2, swivel lock anchors x4. 4. Excision of spurring on the os calcis. 5. Excision of pre-Achilles bursa.  SURGEON:  Loreta Ave, MD  ASSISTANT:  Genene Churn. Barry Dienes, [place], present throughout the entire case and necessary for timely completion of procedure.  ANESTHESIA:  General.  ESTIMATED BLOOD LOSS:  Minimal.  SPECIMENS:  None.  CULTURES:  None.  COMPLICATIONS:  None.  DRESSINGS:  Soft compressive.  TOURNIQUET TIME:  45 minutes.  DESCRIPTION OF PROCEDURE:  The patient was brought to operating room, placed on the operating table in supine position.  After adequate anesthesia had been obtained, turned to a prone position.  Appropriate padding and support.  Tourniquet applied to upper aspect of the right leg.  Prepped and draped in usual sterile fashion.  Exsanguinated with elevation of Esmarch.  Tourniquet inflated to 350 mmHg.  Longitudinal incision on the lateral side of the Achilles tendon going down over the back of the os calcis.  Skin and subcutaneous tissue divided.  Marked thickening  inflammation all over the back of the heel distal Achilles tendon.  Opened longitudinally and taken down into the distal end. Marked mucinous degeneration, partial tearing of the entire distal Achilles.  All of these debrided back to healthy tissue.  The loose osteochondral fragments removed.  The pre-Achilles bursa exposed through the defect of the tendon excised.  The prominent pump-bump excised and contoured with saw and rongeurs.  Fluoroscopic guidance to ensure adequate confirmation.  Wound irrigated.  Four drill holes made in the os calcis, two proximal, two distal.  In the 2 proximal holes, 2 swivel lock anchors were placed.  The FiberWire from one of those was used to weave up and down the Achilles to sew the longitudinal defect that created.  The two fiber weaves were brought through the tendon cross over themselves and then anchored into the 2 distal holes with 2 swivel lock anchors.  This gave me a nice firm reattachment and a broad bed for healing.  I could get it through full passive motion at completion. Fluoroscopy used to confirm adequate debridement of all bony fragments. Wound irrigated, closed with Vicryl and staples.  Sterile compressive dressing applied.  Short-leg splint applied.  Tourniquet deflated and removed.  Returned to supine position.  Anesthesia reversed.  Brought to the recovery room.  Tolerated the surgery well.  No complications.     Loreta Ave, M.D.     DFM/MEDQ  D:  10/07/2012  T:  10/08/2012  Job:  [id number]

## 2012-10-11 ENCOUNTER — Encounter (HOSPITAL_BASED_OUTPATIENT_CLINIC_OR_DEPARTMENT_OTHER): Payer: Self-pay | Admitting: Orthopedic Surgery

## 2012-10-11 ENCOUNTER — Other Ambulatory Visit: Payer: Self-pay | Admitting: Internal Medicine

## 2012-10-19 ENCOUNTER — Encounter: Payer: Self-pay | Admitting: Internal Medicine

## 2012-10-20 ENCOUNTER — Other Ambulatory Visit: Payer: Self-pay | Admitting: Internal Medicine

## 2012-11-04 ENCOUNTER — Ambulatory Visit (INDEPENDENT_AMBULATORY_CARE_PROVIDER_SITE_OTHER): Payer: MEDICARE | Admitting: Internal Medicine

## 2012-11-04 ENCOUNTER — Encounter: Payer: Self-pay | Admitting: Internal Medicine

## 2012-11-04 VITALS — BP 124/70 | HR 45 | Ht 63.0 in | Wt 180.4 lb

## 2012-11-04 DIAGNOSIS — J309 Allergic rhinitis, unspecified: Secondary | ICD-10-CM

## 2012-11-04 MED ORDER — AZELASTINE-FLUTICASONE 137-50 MCG/ACT NA SUSP: 1.0000 | Freq: Every day | NASAL | Status: DC

## 2012-11-04 MED ORDER — PHENYLEPHRINE HCL 1 % NA SOLN
3.0000 [drp] | Freq: Four times a day (QID) | NASAL | Status: DC | PRN
  Administered 2012-11-04: 3 [drp] via NASAL

## 2012-11-04 MED ORDER — METHYLPREDNISOLONE ACETATE 80 MG/ML IJ SUSP
80.0000 mg | Freq: Once | INTRAMUSCULAR | Status: AC
  Administered 2012-11-04: 80 mg via INTRAMUSCULAR

## 2012-11-04 NOTE — Patient Instructions (Addendum)
Neb neo nasal  Depo 80  Sample Dymista nasal spray    1-2 puffs each nostril once daily at bedtime    Try this instead of Nasonex. When the sample is used up, go back to the Nasonex

## 2012-11-04 NOTE — Progress Notes (Signed)
Subjective:    Patient ID: Brenda Stephens, female    DOB: [birth year], 72 y.o.   MRN: [id number]  HPI 72 yo female with known hx of AR.   February 13, 2010 2:01 PMAcute OV CDY patient.Pt seen by CDY 01/31/10 rx pred pulse and taper. see hx above and pt concurs is accurate. After the prednisone was given the cough subsided significantly but still with persisten R ear pain. Also notes ear feels full. Sinuses are not stopped up. There is no thick secretions seen. There is less dyspnea. The pt completed course of prednisone. No ABX given.  She remains off symbicort and singulair. She was to have RAST assay with Dr Maple Hudson but not done due to EMR being down on 01/31/10.  She has seen Dr Annalee Genta in the past of ENT.  March 14, 2010-  Seen by Dr Delford Field c/o pain R ear> Dr Lazarus Salines 8/19 > Cerumen removal didn't help. C/O if blows nose she feels "waves" in right ear. Original c/o cough is better- less deep and no longer daily or productive. In last 2-3 days more rhinorhea and postnasal drip, and still uncomfortable in right ear. Denies chest tightness, wheeze or dyspnea. No fever, no nasal congestion or discharge.  Allergy profile- elevated cat (daughter has cats). Total IgE 5.5  Sed rate 39, EOS 1.2%  May 30, 2010- Cough/ allergy  Nurse-CC: 2 month follow up visit-still having heaviness on chest with activiity. Denies any wheezing.  Pending left shoulder replacement next week by Dr Eulah Pont. She continues to notice substernal heaviness that was not clarified by cardiac testing in the Spring, done in [place], including treadmill stress test and echocardiogram. . Occasional dry cough. Denies wheeze, palpitation, dysphagia or dyspnea.  Last seen by Dr. Maple Hudson  05/2010   09/21/2012 Acute OV  Complains of recurrent sinus infections x5 months. Finished levaquin 1 week ago by ENT.   Has watery right eye, sneezing, head congestion w/ yellow/white mucus, runny nose, sinus pressure, sore throat x5days. Congestion is  mainly clear.  Has been on several abx . Over last few months.  Watery eyes , drippy nose  2 dogs/cats inside  Nasonex helps some.  No antihistamines have been used.  Post nasal drip is getting worse.  Dry ticking cough -rare  No chest pain, edema or n/v. No orthopnea.   11/04/12- 75 yoF former smoker followed for allergic rhinitis FOLLOWS FOR: last seen 2011; having achy feeling in upper head area; sometimes feels as though someone is jabbing with a knife in her eyes; having sore/painful ear, increased allergy flare up again. Remembers prednisone significantly helping when here with nurse practitioner March 25. Now in the past week symptoms are flaring again. Several rounds of antibiotics this winter. Describes retro-orbital pressure pain but blows out little. Left eye sharp pain. Right ear pops and hurts.  ROS-see HPI Constitutional:   No-   weight loss, night sweats, fevers, chills, fatigue, lassitude. HEENT:   + headaches, difficulty swallowing, tooth/dental problems, sore throat,       No-  sneezing, itching, +ear ache, +nasal congestion, post nasal drip,  CV:  No-   chest pain, orthopnea, PND, swelling in lower extremities, anasarca,                                  dizziness, palpitations Resp: No-   shortness of breath with exertion or at rest.  No-   productive cough,  No non-productive cough,  No- coughing up of blood.              No-   change in color of mucus.  No- wheezing.   Skin: No-   rash or lesions. GI:  No-   heartburn, indigestion, abdominal pain, nausea, vomiting, GU:  MS:  No-   joint pain or swelling.  Neuro-     nothing unusual Psych:  No- change in mood or affect. No depression or anxiety.  No memory loss.    Objective:  OBJ- Physical Exam General- Alert, Oriented, Affect-appropriate, Distress- none acute Skin- rash-none, lesions- none, excoriation- none Lymphadenopathy- none Head- atraumatic            Eyes- Gross vision intact, PERRLA,  conjunctivae and secretions clear            Ears- Hearing, canals-cerumen            Nose- Clear, no-Septal dev, mucus, polyps, erosion, perforation             Throat- Mallampati II , mucosa clear , drainage- none, tonsils- atrophic Neck- flexible , trachea midline, no stridor , thyroid nl, carotid no bruit Chest - symmetrical excursion , unlabored           Heart/CV- RRR , no murmur , no gallop  , no rub, nl s1 s2                           - JVD- none , edema- none, stasis changes- none, varices- none           Lung- clear to P&A, wheeze- none, cough- none , dullness-none, rub- none           Chest wall-  Abd- Br/ Gen/ Rectal- Not done, not indicated Extrem- cyanosis- none, clubbing, none, atrophy- none, strength- nl. R walking boot"bone spurs" Neuro- grossly intact to observation Assessment & Plan:

## 2012-11-14 NOTE — Assessment & Plan Note (Signed)
Seasonal exacerbation with eustachian dysfunction Plan- neb nasal decongestant, Depo-Medrol, try sample Dymista instead of Nasonex

## 2012-11-24 ENCOUNTER — Other Ambulatory Visit: Payer: Self-pay | Admitting: Internal Medicine

## 2012-12-01 ENCOUNTER — Other Ambulatory Visit: Payer: Self-pay | Admitting: Internal Medicine

## 2012-12-14 ENCOUNTER — Other Ambulatory Visit: Payer: Self-pay | Admitting: Internal Medicine

## 2012-12-15 ENCOUNTER — Other Ambulatory Visit: Payer: Self-pay | Admitting: Internal Medicine

## 2013-01-10 ENCOUNTER — Telehealth: Payer: Self-pay | Admitting: Internal Medicine

## 2013-01-10 MED ORDER — OMEPRAZOLE-SODIUM BICARBONATE 40-1100 MG PO CAPS: ORAL_CAPSULE | ORAL | Status: DC

## 2013-01-10 NOTE — Telephone Encounter (Signed)
Renew Zegerid 40 # 90; please change Med List if incorrect

## 2013-01-10 NOTE — Telephone Encounter (Signed)
Pt is not on omeprazole  Pt is currently taking ZEGERID 40-1100 MG.Please advise

## 2013-01-10 NOTE — Telephone Encounter (Signed)
Pt return call,Left message to call office.  

## 2013-01-10 NOTE — Telephone Encounter (Signed)
[facility] uses [facility] [address]

## 2013-01-10 NOTE — Telephone Encounter (Signed)
Hopp please advise  

## 2013-01-10 NOTE — Telephone Encounter (Signed)
Left message to call office

## 2013-01-10 NOTE — Telephone Encounter (Signed)
  The latest medical information suggests that the PPI should be taken at the lowest possible doses because of potential adverse effects especially on bone integrity & gut absorption of nutrients . If more than 20 mg of Prilosec to omeprazole daily as needed; I do recommend GI reevaluation.

## 2013-01-10 NOTE — Telephone Encounter (Signed)
Patient's husband came into the office requesting that we change her omeprazole rx from 40mg  once a day to 20 mg twice a day (#60). Their insurance will not cover it the way it currently written.

## 2013-01-11 ENCOUNTER — Other Ambulatory Visit: Payer: Self-pay | Admitting: *Deleted

## 2013-01-11 DIAGNOSIS — K219 Gastro-esophageal reflux disease without esophagitis: Secondary | ICD-10-CM

## 2013-01-11 MED ORDER — OMEPRAZOLE 20 MG PO CPDR: 20.0000 mg | DELAYED_RELEASE_CAPSULE | Freq: Every day | ORAL | Status: DC

## 2013-01-11 NOTE — Telephone Encounter (Signed)
Discuss with patient husband when he came in to office on yesterday. Rx sent.

## 2013-01-11 NOTE — Telephone Encounter (Signed)
Pts spouse came to office to state that pt needed omeprazole 20mg  called to [facility] for a 90 days supply, not Zegerid. The medication list was updated and [facility]

## 2013-01-17 ENCOUNTER — Other Ambulatory Visit: Payer: Self-pay | Admitting: Internal Medicine

## 2013-01-19 ENCOUNTER — Telehealth: Payer: Self-pay

## 2013-01-19 NOTE — Telephone Encounter (Signed)
Patients wife presents to the office with questions concerning his wife's prescription. He states that [facility] and [facility] had a 30day supply for 40mg  and the cost would be $108. He stated that Dr. Alwyn Ren had corrected his prescription last week. After review, the prescription that was corrected was Mrs. Sorenson and Mr. Diver. GF/RN

## 2013-02-17 ENCOUNTER — Other Ambulatory Visit: Payer: Self-pay | Admitting: Internal Medicine

## 2013-03-17 ENCOUNTER — Other Ambulatory Visit: Payer: Self-pay | Admitting: Internal Medicine

## 2013-03-20 ENCOUNTER — Other Ambulatory Visit: Payer: Self-pay | Admitting: Internal Medicine

## 2013-03-23 ENCOUNTER — Telehealth: Payer: Self-pay | Admitting: *Deleted

## 2013-03-23 NOTE — Telephone Encounter (Signed)
Called patient and left message to inform her that prescription for celebrex was [facility] on [address] for her to pick up.

## 2013-03-23 NOTE — Telephone Encounter (Signed)
Called and spoke with patients husband to inform him that her prescription for Ambien was ready to be picked up at our office and that a UDS would be required. Stated understanding.

## 2013-03-23 NOTE — Telephone Encounter (Signed)
Med filled.  

## 2013-03-23 NOTE — Telephone Encounter (Signed)
OK x 1 

## 2013-03-23 NOTE — Telephone Encounter (Signed)
Rx sent to the pharmacy by e-script.//AB/CMA 

## 2013-03-23 NOTE — Telephone Encounter (Signed)
Patient's pharmacy is calling to f/u on refill request for Zolpidem.

## 2013-03-23 NOTE — Telephone Encounter (Signed)
Last OV 07-19-12 Med filled 02-17-13 #10 with 0 refills    Contract received, no UDS

## 2013-04-14 ENCOUNTER — Encounter: Payer: Self-pay | Admitting: Podiatry

## 2013-04-14 ENCOUNTER — Ambulatory Visit (INDEPENDENT_AMBULATORY_CARE_PROVIDER_SITE_OTHER): Payer: Medicare Other

## 2013-04-14 ENCOUNTER — Ambulatory Visit (INDEPENDENT_AMBULATORY_CARE_PROVIDER_SITE_OTHER): Payer: Medicare Other | Admitting: Podiatry

## 2013-04-14 VITALS — BP 152/71 | HR 56 | Resp 24 | Ht 63.25 in | Wt 175.0 lb

## 2013-04-14 DIAGNOSIS — R52 Pain, unspecified: Secondary | ICD-10-CM

## 2013-04-14 DIAGNOSIS — M722 Plantar fascial fibromatosis: Secondary | ICD-10-CM

## 2013-04-14 DIAGNOSIS — M7660 Achilles tendinitis, unspecified leg: Secondary | ICD-10-CM

## 2013-04-14 MED ORDER — TRIAMCINOLONE ACETONIDE 10 MG/ML IJ SUSP
5.0000 mg | Freq: Once | INTRAMUSCULAR | Status: AC
  Administered 2013-04-14: 5 mg via INTRA_ARTICULAR

## 2013-04-14 NOTE — Progress Notes (Signed)
Subjective:     Patient ID: Brenda Stephens, female   DOB: [birth year], 72 y.o.   MRN: [id number]  Foot Pain   patient presents complaining of pain in the bottom of the right heel and mild discomfort in the left Achilles. She stated that she had surgery on her right Achilles by Dr. Eulah Pont in April had problems with it but it now seems to be improved   Review of Systems  All other systems reviewed and are negative.       Objective:   Physical Exam  Nursing note and vitals reviewed. Constitutional: She appears well-developed and well-nourished.  Cardiovascular: Intact distal pulses.   Musculoskeletal: Normal range of motion.  Neurological: She is alert.  Skin: Skin is warm.   patient is found to have discomfort plantar aspect right heel at the insertion of the tendon into the calcaneus and mild pain at the Achilles tendon insertion left. Also noted there to be a scar on the posterior aspect of the right heel that has healed well with minimal discomfort noted    Assessment:     Plantar fasciitis right and mild Achilles tendinitis left    Plan:     X-rays reviewed and discussed. Injected the right plantar fascia 3 mg Kenalog 5 mg Xylocaine Marcaine mixture. Reappoint in 2 weeks

## 2013-04-14 NOTE — Progress Notes (Signed)
  Subjective:    Patient ID: Brenda Stephens, female    DOB: 07/28/1940, 72 y.o.   MRN: [id number] "I had surgery on my left foot heel and achilles and it is still bothering me.  My left foot has some of the same symptoms."    Foot Pain This is a chronic problem. The current episode started more than 1 month ago. The problem occurs intermittently. The problem has been gradually improving. Associated symptoms include joint swelling. The symptoms are aggravated by walking and standing. She has tried acetaminophen and ice for the symptoms. The treatment provided mild relief.      Review of Systems  Musculoskeletal: Positive for joint swelling.       Objective:   Physical Exam        Assessment & Plan:

## 2013-04-25 ENCOUNTER — Other Ambulatory Visit: Payer: Self-pay | Admitting: Internal Medicine

## 2013-04-27 ENCOUNTER — Telehealth: Payer: Self-pay | Admitting: *Deleted

## 2013-04-27 NOTE — Telephone Encounter (Signed)
OK # 10 , 1 q 3rd night prn only

## 2013-04-27 NOTE — Telephone Encounter (Signed)
zolpidem (AMBIEN) 10 MG tablet Last Refill: 03/20/2013, #10 Last OV: 07/19/2012

## 2013-04-28 ENCOUNTER — Other Ambulatory Visit: Payer: Self-pay | Admitting: *Deleted

## 2013-04-28 MED ORDER — ZOLPIDEM TARTRATE 10 MG PO TABS: ORAL_TABLET | ORAL | Status: DC

## 2013-04-28 NOTE — Telephone Encounter (Signed)
Ambien refilled

## 2013-05-02 ENCOUNTER — Encounter: Payer: Self-pay | Admitting: Podiatry

## 2013-05-02 ENCOUNTER — Ambulatory Visit (INDEPENDENT_AMBULATORY_CARE_PROVIDER_SITE_OTHER): Payer: Medicare Other | Admitting: Podiatry

## 2013-05-02 VITALS — BP 135/61 | HR 61 | Resp 16

## 2013-05-02 DIAGNOSIS — M722 Plantar fascial fibromatosis: Secondary | ICD-10-CM

## 2013-05-02 MED ORDER — TRIAMCINOLONE ACETONIDE 10 MG/ML IJ SUSP
5.0000 mg | Freq: Once | INTRAMUSCULAR | Status: AC
  Administered 2013-05-02: 5 mg via INTRA_ARTICULAR

## 2013-05-03 NOTE — Progress Notes (Signed)
Subjective:     Patient ID: Brenda Stephens, female   DOB: 31-Oct-1940, 72 y.o.   MRN: [id number]  Foot Pain   patient presents stating I am some better but still having a lot of pain in both feet   Review of Systems  All other systems reviewed and are negative.       Objective:   Physical Exam  Vitals reviewed. Constitutional: She is oriented to person, place, and time.  Cardiovascular: Intact distal pulses.   Musculoskeletal: Normal range of motion.  Neurological: She is oriented to person, place, and time.  Skin: Skin is warm.   patient has discomfort plantar heel right and outside of left foot probable compensation. No other current pathology     Assessment:     Plantar fasciitis right plantar fasciitis left    Plan:     Reviewed both conditions and today injected the plantar fascia of both feet 3 mg Kenalog 5 mg Xylocaine Marcaine mixture. Dispensed fascial brace with instructions on usage and reappoint in 3 week

## 2013-05-05 ENCOUNTER — Ambulatory Visit: Payer: MEDICARE | Admitting: Cardiology

## 2013-05-08 ENCOUNTER — Other Ambulatory Visit: Payer: Self-pay | Admitting: Internal Medicine

## 2013-05-10 ENCOUNTER — Ambulatory Visit (INDEPENDENT_AMBULATORY_CARE_PROVIDER_SITE_OTHER): Payer: MEDICARE | Admitting: Cardiology

## 2013-05-10 ENCOUNTER — Encounter: Payer: Self-pay | Admitting: Cardiology

## 2013-05-10 VITALS — BP 171/80 | HR 58 | Ht 63.25 in | Wt 182.0 lb

## 2013-05-10 DIAGNOSIS — R072 Precordial pain: Secondary | ICD-10-CM

## 2013-05-10 DIAGNOSIS — R079 Chest pain, unspecified: Secondary | ICD-10-CM | POA: Insufficient documentation

## 2013-05-10 DIAGNOSIS — I10 Essential (primary) hypertension: Secondary | ICD-10-CM

## 2013-05-10 NOTE — Progress Notes (Signed)
HPI The patient presents for evaluation of chest discomfort.   The last time I saw her was for evaluation of chest discomfort.at that time she had a somewhat submaximal stress test followed by perfusion study which demonstrated a well preserved ejection fraction and no evidence of ischemia or infarct. She's had problems with hiatal hernia and reflux. She continues to have pain with some jaw discomfort and discomfort radiating to her right shoulder blade. This happens sporadically. The neck, some left arm discomfort and some chest discomfort are similar to pain she's had before but the pain radiating to the back is new. It happens at rest. She's not describing new associated symptoms of nausea vomiting or diaphoresis. No palpitations, presyncope or syncope. There is however increased anxiety and sleep problems which she is actively having rest. It was suggested that she followup with me to discuss the shoulder discomfort.  Allergies  Allergen Reactions  . Latex     rash  . Aspirin     GI bleeding  . Bactrim [Sulfamethoxazole-Tmp Ds]     Nausea   . Oxycodone-Acetaminophen     nausea    Current Outpatient Prescriptions  Medication Sig Dispense Refill  . atorvastatin (LIPITOR) 20 MG tablet TAKE 1 TABLET BY MOUTH AT BEDTIME  90 tablet  1  . Azelastine-Fluticasone (DYMISTA) 137-50 MCG/ACT SUSP Place 1 spray into the nose at bedtime.  1 Bottle  0  . b complex vitamins capsule Take 1 capsule by mouth daily.      . celecoxib (CELEBREX) 200 MG capsule TAKE ONE CAPSULE BY MOUTH AS NEEDED ONLY **NOT TO BE TAKEN REGULARLY DUE TO GI & CARDIAC RISK** PT IS TAKING ALMOST EVERY DAY      . metoCLOPramide (REGLAN) 5 MG tablet Take 1 tablet by mouth 4 (four) times daily.      . metoprolol succinate (TOPROL-XL) 50 MG 24 hr tablet Take 1 tablet (50 mg total) by mouth daily.  90 tablet  3  . Multiple Vitamins-Minerals (CENTRUM SILVER ADULT 50+ PO) Take 1 tablet by mouth.      Marland Kitchen NASONEX 50 MCG/ACT nasal  spray USE TWO SPRAYS EACH      NOSTRIL AT BEDTIME.  17 g  5  . Polyethyl Glycol-Propyl Glycol (SYSTANE OP) Place 1 drop into both eyes 4 (four) times daily.      . Saline (AYR SALINE NASAL DROPS) 0.65 % (SOLN) SOLN Place 1 spray into the nose 3 (three) times daily.      . Sennosides (SENNA) 15 MG TABS Take 1-2 tablets a day      . spironolactone-hydrochlorothiazide (ALDACTAZIDE) 25-25 MG per tablet Take 1 tablet by mouth daily.  90 tablet  3  . SYNTHROID 50 MCG tablet TAKE 1 TABLET (50 MCG TOTAL) BY MOUTH DAILY.  90 tablet  3  . terconazole (TERAZOL 3) 0.8 % vaginal cream as needed.      . venlafaxine XR (EFFEXOR-XR) 150 MG 24 hr capsule TAKE 1 CAPSULE (150 MG TOTAL) BY MOUTH DAILY.  90 capsule  2  . verapamil (CALAN-SR) 240 MG CR tablet TAKE 1 TABLET (240 MG TOTAL) BY MOUTH AT BEDTIME.  90 tablet  2  . zolpidem (AMBIEN) 10 MG tablet Take 5 mg by mouth at bedtime.       No current facility-administered medications for this visit.    Past Medical History  Diagnosis Date  . GERD (gastroesophageal reflux disease)   . Hypertension   . Anemia   .  Allergy     latex  . Hx of colonic polyps     Dr Ewing Schlein  . Dyslipidemia   . Eustachian tube dysfunction   . Other abnormal glucose   . Hx of cardiovascular stress test     Myoview 10/13:  apical thinning, EF 72%, no ischemia  . Arthritis     Past Surgical History  Procedure Laterality Date  . Colonoscopy w/ polypectomy      Dr Ewing Schlein  . Total shoulder replacement      Right shoulder  . Appendectomy    . Tubal ligation    . Abdominal hysterectomy      BSO for uterine fibroids  . Cholecystectomy    . Sinus surgery with instatrak    . 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 Cataract extraction, bilateral    . Exploratory laparotomy    . Achilles tendon surgery Right 10/07/2012    Procedure: RIGHT REPAIR RUPTURE TENDON PRIMARY OPEN/PERCUTANEOUS ;  Surgeon: Loreta Ave, MD;  Location: [facility];  Service:  Orthopedics;  Laterality: Right;  . Ear cyst excision Right 10/07/2012    Procedure: EXCISION BONE CYST BENIGN TUMOR Manuela Neptune ;  Surgeon: Loreta Ave, MD;  Location: [facility];  Service: Orthopedics;  Laterality: Right;    ROS:  Insomnia.  As stated in the HPI and negative for all other systems.  PHYSICAL EXAM BP 171/80  Pulse 58  Ht 5' 3.25" (1.607 m)  Wt 182 lb (82.555 kg)  BMI 31.97 kg/m2 GENERAL:  Well appearing HEENT:  Pupils equal round and reactive, fundi not visualized, oral mucosa unremarkable NECK:  No jugular venous distention, waveform within normal limits, carotid upstroke brisk and symmetric, no bruits, no thyromegaly LYMPHATICS:  No cervical, inguinal adenopathy LUNGS:  Clear to auscultation bilaterally BACK:  No CVA tenderness CHEST:  Unremarkable HEART:  PMI not displaced or sustained,S1 and S2 within normal limits, no S3, no S4, no clicks, no rubs, no murmurs ABD:  Flat, positive bowel sounds normal in frequency in pitch, no bruits, no rebound, no guarding, no midline pulsatile mass, no hepatomegaly, no splenomegaly EXT:  2 plus pulses throughout, no edema, no cyanosis no clubbing SKIN:  No rashes no nodules NEURO:  Cranial nerves II through XII grossly intact, motor grossly intact throughout PSYCH:  Cognitively intact, oriented to person place and time   EKG:  Sinus bradycardia, rate 58, axis within normal limits, intervals within normal limits, no acute ST-T wave changes.  05/10/2013   ASSESSMENT AND PLAN   HYPERTENSION - Her blood pressure is somewhat elevated. As below she is going to be actively treating her stress, anxiety and insomnia. If her blood pressure remains elevated I will titrate her medications very  CHEST PAIN -  Her current discomfort radiating to her back is quite atypical. It is related to the stress and anxiety she is describing. She had a negative stress perfusion study last year. We discussed managing the  stress and anxiety as she is actively doing and considering further cardiovascular testing only if she has continued  Or worsening discomfort.

## 2013-05-10 NOTE — Patient Instructions (Signed)
Your physician recommends that you schedule a follow-up appointment in: 2 MONTHS WITH DR. [facility]  Your physician recommends that you continue on your current medications as directed. Please refer to the Current Medication list given to you today.

## 2013-05-13 ENCOUNTER — Encounter: Payer: Self-pay | Admitting: Family Medicine

## 2013-05-13 ENCOUNTER — Ambulatory Visit (INDEPENDENT_AMBULATORY_CARE_PROVIDER_SITE_OTHER): Payer: MEDICARE | Admitting: Family Medicine

## 2013-05-13 VITALS — BP 190/90 | HR 105 | Temp 97.8°F | Wt 178.2 lb

## 2013-05-13 DIAGNOSIS — I10 Essential (primary) hypertension: Secondary | ICD-10-CM

## 2013-05-13 DIAGNOSIS — J0110 Acute frontal sinusitis, unspecified: Secondary | ICD-10-CM | POA: Insufficient documentation

## 2013-05-13 MED ORDER — AMOXICILLIN 875 MG PO TABS: 875.0000 mg | ORAL_TABLET | Freq: Two times a day (BID) | ORAL | Status: DC

## 2013-05-13 NOTE — Assessment & Plan Note (Signed)
Deteriorated.  Will not adjust meds b/c pt has not taken her meds for today.  Pt instructed to monitor BP and call for appt if consistently elevated.  Pt expressed understanding and is in agreement w/ plan.

## 2013-05-13 NOTE — Progress Notes (Signed)
  Subjective:    Patient ID: Brenda Stephens, female    DOB: July 01, 1940, 72 y.o.   MRN: [id number]  HPI URI- 1 week ago went to 'doc in a box' for sinus pain/pressure, congestion, N/D- no vomiting.  No fever.  + chills.  Was treated w/ Zpack and thought she was feeling better until sxs all returned yesterday, 'with a vengeance'.    HTN- chronic problem, very elevated today.  Not taking OTC cough or cold meds.  Has not taken regular BP meds today.  Denies CP, SOB, visual changes, edema.   Review of Systems For ROS see HPI      Objective:   Physical Exam  Vitals reviewed. Constitutional: She appears well-developed and well-nourished. No distress.  HENT:  Head: Normocephalic and atraumatic.  Right Ear: Tympanic membrane normal.  Left Ear: Tympanic membrane normal.  Nose: Mucosal edema and rhinorrhea present. Right sinus exhibits frontal sinus tenderness. Right sinus exhibits no maxillary sinus tenderness. Left sinus exhibits frontal sinus tenderness. Left sinus exhibits no maxillary sinus tenderness.  Mouth/Throat: Uvula is midline and mucous membranes are normal. Posterior oropharyngeal erythema present. No oropharyngeal exudate.  Eyes: Conjunctivae and EOM are normal. Pupils are equal, round, and reactive to light.  Neck: Normal range of motion. Neck supple.  Cardiovascular: Normal rate, regular rhythm and normal heart sounds.   Pulmonary/Chest: Effort normal and breath sounds normal. No respiratory distress. She has no wheezes.  Lymphadenopathy:    She has no cervical adenopathy.          Assessment & Plan:

## 2013-05-13 NOTE — Progress Notes (Signed)
Pre visit review using our clinic review tool, if applicable. No additional management support is needed unless otherwise documented below in the visit note. 

## 2013-05-13 NOTE — Assessment & Plan Note (Signed)
New.  Pt's sxs and PE consistent w/ infxn.  Start abx.  Reviewed supportive care and red flags that should prompt return.  Pt expressed understanding and is in agreement w/ plan.  

## 2013-05-13 NOTE — Patient Instructions (Signed)
Follow up as needed Start the Amoxicillin twice daily for the sinus infection Drink plenty of fluids Use the Zofran as needed for nausea Immodium as need for diarrhea REST! Please take all your BP meds when you get home Continue to check your BP at home and call if regularly higher than 140/90 Hang in there!!

## 2013-05-19 ENCOUNTER — Other Ambulatory Visit: Payer: Self-pay | Admitting: Internal Medicine

## 2013-05-20 NOTE — Telephone Encounter (Signed)
Atorvastatin refilled per protocol 

## 2013-05-23 ENCOUNTER — Encounter: Payer: Self-pay | Admitting: Podiatry

## 2013-05-23 ENCOUNTER — Encounter: Payer: Self-pay | Admitting: Internal Medicine

## 2013-05-23 ENCOUNTER — Ambulatory Visit (INDEPENDENT_AMBULATORY_CARE_PROVIDER_SITE_OTHER): Payer: Medicare Other | Admitting: Podiatry

## 2013-05-23 VITALS — BP 153/72 | HR 60 | Resp 12

## 2013-05-23 DIAGNOSIS — M722 Plantar fascial fibromatosis: Secondary | ICD-10-CM

## 2013-05-23 NOTE — Progress Notes (Signed)
Subjective:     Patient ID: Brenda Stephens, female   DOB: March 25, 1941, 72 y.o.   MRN: [id number]  HPI patient states that my heels are feeling quite a bit better and still painful at the end of the day but minimal   Review of Systems     Objective:   Physical Exam    neurovascular status intact patient is well oriented x3 with mild discomfort of the heels Assessment:     Improving plantar fasciitis both heels    Plan:     Reviewed physical therapy and supportive shoe gear and dispensed orthotics today with instructions reappoint her recheck as needed

## 2013-06-10 ENCOUNTER — Other Ambulatory Visit: Payer: Self-pay | Admitting: Internal Medicine

## 2013-06-17 ENCOUNTER — Other Ambulatory Visit: Payer: Self-pay | Admitting: Internal Medicine

## 2013-06-17 NOTE — Telephone Encounter (Signed)
Atorvastatin, Verapamil and Spironolactone-HCTZ refilled per protocol. Only 30 days given because patient needs OV. JG//CMA

## 2013-07-12 ENCOUNTER — Ambulatory Visit: Payer: MEDICARE | Admitting: Cardiology

## 2013-07-22 ENCOUNTER — Ambulatory Visit (INDEPENDENT_AMBULATORY_CARE_PROVIDER_SITE_OTHER): Payer: MEDICARE | Admitting: Cardiology

## 2013-07-22 ENCOUNTER — Encounter: Payer: Self-pay | Admitting: Cardiology

## 2013-07-22 VITALS — BP 140/80 | HR 68 | Ht 63.5 in | Wt 184.0 lb

## 2013-07-22 DIAGNOSIS — I10 Essential (primary) hypertension: Secondary | ICD-10-CM

## 2013-07-22 NOTE — Patient Instructions (Signed)
The current medical regimen is effective;  continue present plan and medications.  Follow up in 1 year with Dr Hochrein.  You will receive a letter in the mail 2 months before you are due.  Please call us when you receive this letter to schedule your follow up appointment.  

## 2013-07-22 NOTE — Progress Notes (Signed)
HPI The patient presents for evaluation of chest discomfort.   Since I last saw her she has been doing much better. She may get a little discomfort in her neck or upper chest but only with certain foods. She does not have this with activities such as water aerobics.  The patient denies any new symptoms such as chest discomfort, neck or arm discomfort. There has been no new shortness of breath, PND or orthopnea. There have been no reported palpitations, presyncope or syncope.    Allergies  Allergen Reactions  . Latex     rash  . Aspirin     GI bleeding  . Bactrim [Sulfamethoxazole-Tmp Ds]     Nausea   . Oxycodone-Acetaminophen     nausea    Current Outpatient Prescriptions  Medication Sig Dispense Refill  . atorvastatin (LIPITOR) 20 MG tablet TAKE 1 TABLET BY MOUTH AT BEDTIME  90 tablet  1  . atorvastatin (LIPITOR) 20 MG tablet TAKE 1 TABLET BY MOUTH AT BEDTIME  30 tablet  0  . b complex vitamins capsule Take 1 capsule by mouth daily.      . celecoxib (CELEBREX) 200 MG capsule TAKE ONE CAPSULE BY MOUTH AS NEEDED ONLY **NOT TO BE TAKEN REGULARLY DUE TO GI & CARDIAC RISK** PT IS TAKING ALMOST EVERY DAY      . metoCLOPramide (REGLAN) 5 MG tablet Take 1 tablet by mouth 4 (four) times daily.      . metoprolol succinate (TOPROL-XL) 50 MG 24 hr tablet Take 1 tablet (50 mg total) by mouth daily.  90 tablet  3  . Multiple Vitamins-Minerals (CENTRUM SILVER ADULT 50+ PO) Take 1 tablet by mouth.      Marland Kitchen NASONEX 50 MCG/ACT nasal spray USE TWO SPRAYS EACH      NOSTRIL AT BEDTIME.  17 g  5  . Polyethyl Glycol-Propyl Glycol (SYSTANE OP) Place 1 drop into both eyes 4 (four) times daily.      . Saline (AYR SALINE NASAL DROPS) 0.65 % (SOLN) SOLN Place 1 spray into the nose 3 (three) times daily.      Marland Kitchen spironolactone-hydrochlorothiazide (ALDACTAZIDE) 25-25 MG per tablet Take 1 tablet by mouth daily.  90 tablet  3  . spironolactone-hydrochlorothiazide (ALDACTAZIDE) 25-25 MG per tablet TAKE 1 TABLET BY  MOUTH DAILY  30 tablet  0  . SYNTHROID 50 MCG tablet TAKE 1 TABLET (50 MCG TOTAL) BY MOUTH DAILY.  90 tablet  3  . venlafaxine XR (EFFEXOR-XR) 150 MG 24 hr capsule TAKE 1 CAPSULE (150 MG TOTAL) BY MOUTH DAILY.  90 capsule  2  . verapamil (CALAN-SR) 240 MG CR tablet TAKE 1 TABLET (240 MG TOTAL) BY MOUTH AT BEDTIME.  30 tablet  0  . verapamil (CALAN-SR) 240 MG CR tablet TAKE 1 TABLET (240 MG TOTAL) BY MOUTH AT BEDTIME.  30 tablet  0  . zolpidem (AMBIEN) 10 MG tablet Take 5 mg by mouth at bedtime.       No current facility-administered medications for this visit.    Past Medical History  Diagnosis Date  . GERD (gastroesophageal reflux disease)   . Hypertension   . Anemia   . Allergy     latex  . Hx of colonic polyps     Dr Watt Climes  . Dyslipidemia   . Eustachian tube dysfunction   . Other abnormal glucose   . Hx of cardiovascular stress test     Myoview 10/13:  apical thinning, EF 72%, no ischemia  .  Arthritis     Past Surgical History  Procedure Laterality Date  . Colonoscopy w/ polypectomy      Dr Watt Climes  . Total shoulder replacement      Right shoulder  . Appendectomy    . Tubal ligation    . Abdominal hysterectomy      BSO for uterine fibroids  . Cholecystectomy    . Sinus surgery with instatrak    . 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 Cataract extraction, bilateral    . Exploratory laparotomy    . Achilles tendon surgery Right 10/07/2012    Procedure: RIGHT REPAIR RUPTURE TENDON PRIMARY OPEN/PERCUTANEOUS ;  Surgeon: Ninetta Lights, MD;  Location: [place];  Service: Orthopedics;  Laterality: Right;  . Ear cyst excision Right 10/07/2012    Procedure: EXCISION BONE CYST BENIGN TUMOR [name] ;  Surgeon: Ninetta Lights, MD;  Location: [place];  Service: Orthopedics;  Laterality: Right;    ROS:   As stated in the HPI and negative for all other systems.  PHYSICAL EXAM BP 140/80  Pulse 68  Ht 5' 3.5" (1.613 m)  Wt  184 lb (83.462 kg)  BMI 32.08 kg/m2 GENERAL:  Well appearing NECK:  No jugular venous distention, waveform within normal limits, carotid upstroke brisk and symmetric, no bruits, no thyromegaly LUNGS:  Clear to auscultation bilaterally CHEST:  Unremarkable HEART:  PMI not displaced or sustained,S1 and S2 within normal limits, no S3, no S4, no clicks, no rubs, no murmurs ABD:  Flat, positive bowel sounds normal in frequency in pitch, no bruits, no rebound, no guarding, no midline pulsatile mass, no hepatomegaly, no splenomegaly EXT:  2 plus pulses throughout, no edema, no cyanosis no clubbing    ASSESSMENT AND PLAN   HYPERTENSION - The blood pressure is at target. No change in medications is indicated. We will continue with therapeutic lifestyle changes (TLC).  CHEST PAIN -  She is no longer having this.  No further work up is indicated.

## 2013-07-25 ENCOUNTER — Other Ambulatory Visit: Payer: Self-pay | Admitting: Internal Medicine

## 2013-07-25 NOTE — Telephone Encounter (Signed)
Spironolactone, Verapamil and Atorvastatin refilled for 90 days. OV due.

## 2013-08-11 ENCOUNTER — Other Ambulatory Visit: Payer: Self-pay | Admitting: Internal Medicine

## 2013-08-15 NOTE — Telephone Encounter (Signed)
Rx sent to the pharmacy by e-script.  Pt needs complete physical.//AB/CMA 

## 2013-08-23 ENCOUNTER — Other Ambulatory Visit: Payer: Self-pay | Admitting: Orthopaedic Surgery

## 2013-08-23 DIAGNOSIS — M545 Low back pain, unspecified: Secondary | ICD-10-CM

## 2013-08-29 ENCOUNTER — Ambulatory Visit
Admission: RE | Admit: 2013-08-29 | Discharge: 2013-08-29 | Disposition: A | Discharge status: Home / Self Care | Payer: MEDICARE | Source: Ambulatory Visit | Attending: Orthopaedic Surgery | Admitting: Orthopaedic Surgery

## 2013-08-29 DIAGNOSIS — M545 Low back pain, unspecified: Secondary | ICD-10-CM

## 2013-09-06 ENCOUNTER — Ambulatory Visit (INDEPENDENT_AMBULATORY_CARE_PROVIDER_SITE_OTHER): Payer: MEDICARE | Admitting: Internal Medicine

## 2013-09-06 ENCOUNTER — Encounter: Payer: Self-pay | Admitting: Internal Medicine

## 2013-09-06 VITALS — BP 120/68 | HR 47 | Ht 63.0 in | Wt 182.0 lb

## 2013-09-06 DIAGNOSIS — J0110 Acute frontal sinusitis, unspecified: Secondary | ICD-10-CM

## 2013-09-06 MED ORDER — PHENYLEPHRINE HCL 1 % NA SOLN: 3.0000 [drp] | Freq: Once | NASAL | Status: DC

## 2013-09-06 MED ORDER — CEFDINIR 300 MG PO CAPS: 300.0000 mg | ORAL_CAPSULE | Freq: Two times a day (BID) | ORAL | Status: DC

## 2013-09-06 MED ORDER — METHYLPREDNISOLONE ACETATE 80 MG/ML IJ SUSP
80.0000 mg | Freq: Once | INTRAMUSCULAR | Status: AC
  Administered 2013-09-06: 80 mg via INTRAMUSCULAR

## 2013-09-06 NOTE — Progress Notes (Signed)
Subjective:    Patient ID: Brenda Stephens, female    DOB: 27-Jan-1941, 73 y.o.   MRN: [id number]  HPI 73 yo female with known hx of AR.   February 13, 2010 2:01 PMAcute OV CDY patient.Pt seen by CDY 01/31/10 rx pred pulse and taper. see hx above and pt concurs is accurate. After the prednisone was given the cough subsided significantly but still with persisten R ear pain. Also notes ear feels full. Sinuses are not stopped up. There is no thick secretions seen. There is less dyspnea. The pt completed course of prednisone. No ABX given.  She remains off symbicort and singulair. She was to have RAST assay with Dr Annamaria Boots but not done due to EMR being down on 01/31/10.  She has seen Dr Wilburn Cornelia in the past of ENT.  March 14, 2010-  Seen by Dr Joya Gaskins c/o pain R ear> Dr Erik Obey 4/09 > Cerumen removal didn't help. C/O if blows nose she feels "waves" in right ear. Original c/o cough is better- less deep and no longer daily or productive. In last 2-3 days more rhinorhea and postnasal drip, and still uncomfortable in right ear. Denies chest tightness, wheeze or dyspnea. No fever, no nasal congestion or discharge.  Allergy profile- elevated cat (daughter has cats). Total IgE 5.5  Sed rate 39, EOS 1.2%  May 30, 2010- Cough/ allergy  Nurse-CC: 2 month follow up visit-still having heaviness on chest with activiity. Denies any wheezing.  Pending left shoulder replacement next week by Dr Percell Miller. She continues to notice substernal heaviness that was not clarified by cardiac testing in the Spring, done in [place], including treadmill stress test and echocardiogram. . Occasional dry cough. Denies wheeze, palpitation, dysphagia or dyspnea.  Last seen by Dr. Annamaria Boots  05/2010   09/21/2012 Acute OV  Complains of recurrent sinus infections x5 months. Finished levaquin 1 week ago by ENT.   Has watery right eye, sneezing, head congestion w/ yellow/white mucus, runny nose, sinus pressure, sore throat x5days. Congestion is  mainly clear.  Has been on several abx . Over last few months.  Watery eyes , drippy nose  2 dogs/cats inside  Nasonex helps some.  No antihistamines have been used.  Post nasal drip is getting worse.  Dry ticking cough -rare  No chest pain, edema or n/v. No orthopnea.   11/04/12- 45 yoF former smoker followed for allergic rhinitis FOLLOWS FOR: last seen 2011; having achy feeling in upper head area; sometimes feels as though someone is jabbing with a knife in her eyes; having sore/painful ear, increased allergy flare up again. Remembers prednisone significantly helping when here with nurse practitioner March 25. Now in the past week symptoms are flaring again. Several rounds of antibiotics this winter. Describes retro-orbital pressure pain but blows out little. Left eye sharp pain. Right ear pops and hurts.  09/07/13-  58 yoF former smoker followed for allergic rhinitis ACUTE VISIT: ears feel clogged; recent sinus infection-was on augmentin. Chest heaviness, headaches, pressure in head, chest and sinus congestion. Pt states she feels like she has not gotten over the recent sinus infection. One month ago sinus infection, finished Augmentin 2 weeks ago. Frontal and bitemporal headache, it ears feel full. Mucus now clear but postnasal drip.  ROS-see HPI Constitutional:   No-   weight loss, night sweats, fevers, chills, fatigue, lassitude. HEENT:   + headaches, difficulty swallowing, tooth/dental problems, sore throat,       No-  sneezing, itching, +ear ache, +nasal congestion, +post nasal  drip,  CV:  No-   chest pain, orthopnea, PND, swelling in lower extremities, anasarca,                                  dizziness, palpitations Resp: No-   shortness of breath with exertion or at rest.              No-   productive cough,  No non-productive cough,  No- coughing up of blood.              No-   change in color of mucus.  No- wheezing.   Skin: No-   rash or lesions. GI:  No-   heartburn,  indigestion, abdominal pain, nausea, vomiting, GU:  MS:  No-   joint pain or swelling.  Neuro-     nothing unusual Psych:  No- change in mood or affect. No depression or anxiety.  No memory loss.    Objective:  OBJ- Physical Exam General- Alert, Oriented, Affect-appropriate, Distress- none acute Skin- rash-none, lesions- none, excoriation- none Lymphadenopathy- none Head- atraumatic            Eyes- Gross vision intact, PERRLA, conjunctivae and secretions clear            Ears- Hearing ok, TMs ok            Nose- Clear, no-Septal dev, mucus, polyps, erosion, perforation             Throat- Mallampati III , mucosa clear , drainage- none, tonsils- atrophic Neck- flexible , trachea midline, no stridor , thyroid nl, carotid no bruit Chest - symmetrical excursion , unlabored           Heart/CV- RRR , no murmur , no gallop  , no rub, nl s1 s2                           - JVD- none , edema- none, stasis changes- none, varices- none           Lung- clear to P&A, wheeze- none, cough- none , dullness-none, rub- none           Chest wall-  Abd- Br/ Gen/ Rectal- Not done, not indicated Extrem- cyanosis- none, clubbing, none, atrophy- none, strength- nl.  Neuro- grossly intact to observation Assessment & Plan:

## 2013-09-06 NOTE — Patient Instructions (Addendum)
Script sent for cefdinir antibiotic  Try taking an otc decongestant like Sudafed-PE each morning for a few days to help with drainage  Neb neo nasal  Depo 74  Ok to cancel the May appointment and schedule back in one year unless needed sooner

## 2013-09-16 ENCOUNTER — Other Ambulatory Visit: Payer: Self-pay | Admitting: Gastroenterology

## 2013-09-16 DIAGNOSIS — R109 Unspecified abdominal pain: Secondary | ICD-10-CM

## 2013-09-23 ENCOUNTER — Ambulatory Visit
Admission: RE | Admit: 2013-09-23 | Discharge: 2013-09-23 | Disposition: A | Discharge status: Home / Self Care | Payer: MEDICARE | Source: Ambulatory Visit | Attending: Gastroenterology | Admitting: Gastroenterology

## 2013-09-23 DIAGNOSIS — R109 Unspecified abdominal pain: Secondary | ICD-10-CM

## 2013-09-23 MED ORDER — IOHEXOL 300 MG/ML  SOLN
100.0000 mL | Freq: Once | INTRAMUSCULAR | Status: AC | PRN
  Administered 2013-09-23: 100 mL via INTRAVENOUS

## 2013-09-24 NOTE — Assessment & Plan Note (Signed)
Now becoming subacute sinusitis. Discussed decongestion, saline rinse Plan- cefdinir

## 2013-11-04 ENCOUNTER — Ambulatory Visit: Payer: MEDICARE | Admitting: Internal Medicine

## 2013-11-25 ENCOUNTER — Other Ambulatory Visit: Payer: Self-pay

## 2013-11-25 MED ORDER — VERAPAMIL HCL ER 240 MG PO TBCR: EXTENDED_RELEASE_TABLET | ORAL | Status: DC

## 2014-01-26 ENCOUNTER — Other Ambulatory Visit: Payer: Self-pay

## 2014-01-26 MED ORDER — VERAPAMIL HCL ER 240 MG PO TBCR: EXTENDED_RELEASE_TABLET | ORAL | Status: DC

## 2014-02-21 ENCOUNTER — Ambulatory Visit (INDEPENDENT_AMBULATORY_CARE_PROVIDER_SITE_OTHER): Payer: MEDICARE | Admitting: Internal Medicine

## 2014-02-21 ENCOUNTER — Encounter: Payer: Self-pay | Admitting: Internal Medicine

## 2014-02-21 ENCOUNTER — Telehealth: Payer: Self-pay | Admitting: Internal Medicine

## 2014-02-21 VITALS — BP 114/72 | HR 68 | Ht 63.0 in | Wt 173.4 lb

## 2014-02-21 DIAGNOSIS — J0111 Acute recurrent frontal sinusitis: Secondary | ICD-10-CM

## 2014-02-21 DIAGNOSIS — J309 Allergic rhinitis, unspecified: Secondary | ICD-10-CM

## 2014-02-21 DIAGNOSIS — J3089 Other allergic rhinitis: Secondary | ICD-10-CM

## 2014-02-21 DIAGNOSIS — J0110 Acute frontal sinusitis, unspecified: Secondary | ICD-10-CM

## 2014-02-21 DIAGNOSIS — J302 Other seasonal allergic rhinitis: Secondary | ICD-10-CM

## 2014-02-21 MED ORDER — METHYLPREDNISOLONE ACETATE 80 MG/ML IJ SUSP
80.0000 mg | Freq: Once | INTRAMUSCULAR | Status: AC
  Administered 2014-02-21: 80 mg via INTRAMUSCULAR

## 2014-02-21 MED ORDER — PHENYLEPHRINE HCL 1 % NA SOLN
3.0000 [drp] | Freq: Once | NASAL | Status: AC
  Administered 2014-02-21: 3 [drp] via NASAL

## 2014-02-21 NOTE — Telephone Encounter (Signed)
Opened in error

## 2014-02-21 NOTE — Progress Notes (Signed)
Subjective:    Patient ID: Brenda Stephens, female    DOB: December 03, 1940, 73 y.o.   MRN: [id number]  HPI 73 yo female with known hx of AR.   February 13, 2010 2:01 PMAcute OV CDY patient.Pt seen by CDY 01/31/10 rx pred pulse and taper. see hx above and pt concurs is accurate. After the prednisone was given the cough subsided significantly but still with persisten R ear pain. Also notes ear feels full. Sinuses are not stopped up. There is no thick secretions seen. There is less dyspnea. The pt completed course of prednisone. No ABX given.  She remains off symbicort and singulair. She was to have RAST assay with Dr Annamaria Boots but not done due to EMR being down on 01/31/10.  She has seen Dr Wilburn Cornelia in the past of ENT.  March 14, 2010-  Seen by Dr Joya Gaskins c/o pain R ear> Dr Erik Obey 4/00 > Cerumen removal didn't help. C/O if blows nose she feels "waves" in right ear. Original c/o cough is better- less deep and no longer daily or productive. In last 2-3 days more rhinorhea and postnasal drip, and still uncomfortable in right ear. Denies chest tightness, wheeze or dyspnea. No fever, no nasal congestion or discharge.  Allergy profile- elevated cat (daughter has cats). Total IgE 5.5  Sed rate 39, EOS 1.2%  May 30, 2010- Cough/ allergy  Nurse-CC: 2 month follow up visit-still having heaviness on chest with activiity. Denies any wheezing.  Pending left shoulder replacement next week by Dr Percell Miller. She continues to notice substernal heaviness that was not clarified by cardiac testing in the Spring, done in [place], including treadmill stress test and echocardiogram. . Occasional dry cough. Denies wheeze, palpitation, dysphagia or dyspnea.  Last seen by Dr. Annamaria Boots  05/2010   09/21/2012 Acute OV  Complains of recurrent sinus infections x5 months. Finished levaquin 1 week ago by ENT.   Has watery right eye, sneezing, head congestion w/ yellow/white mucus, runny nose, sinus pressure, sore throat x5days. Congestion is  mainly clear.  Has been on several abx . Over last few months.  Watery eyes , drippy nose  2 dogs/cats inside  Nasonex helps some.  No antihistamines have been used.  Post nasal drip is getting worse.  Dry ticking cough -rare  No chest pain, edema or n/v. No orthopnea.   11/04/12- 72 yoF former smoker followed for allergic rhinitis FOLLOWS FOR: last seen 2011; having achy feeling in upper head area; sometimes feels as though someone is jabbing with a knife in her eyes; having sore/painful ear, increased allergy flare up again. Remembers prednisone significantly helping when here with nurse practitioner March 25. Now in the past week symptoms are flaring again. Several rounds of antibiotics this winter. Describes retro-orbital pressure pain but blows out little. Left eye sharp pain. Right ear pops and hurts.  09/07/13-  53 yoF former smoker followed for allergic rhinitis ACUTE VISIT: ears feel clogged; recent sinus infection-was on augmentin. Chest heaviness, headaches, pressure in head, chest and sinus congestion. Pt states she feels like she has not gotten over the recent sinus infection. One month ago sinus infection, finished Augmentin 2 weeks ago. Frontal and bitemporal headache, it ears feel full. Mucus now clear but postnasal drip.  02/21/14- 73 yoF former smoker followed for allergic rhinitis, complicated by HBP, GERD, Anemia ACUTE VISIT: Right ear feels like an ocean in there and aches behind right eye. When blowing nose it's clear but causes extreme pain in Right ear. Chills but  denies fever. For 10 days feeling pressure R ear, retro-orbital ache. Some click and pop in ear. Hearing ok, chest ok. Dr Wilburn Cornelia did CT sinuses NEG last week. Sudafed overstims her.  ROS-see HPI Constitutional:   No-   weight loss, night sweats, fevers, chills, fatigue, lassitude. HEENT:   + headaches, difficulty swallowing, tooth/dental problems, sore throat,       No-  sneezing, itching, +ear ache, +nasal  congestion, +post nasal drip,  CV:  No-   chest pain, orthopnea, PND, swelling in lower extremities, anasarca,                                  dizziness, palpitations Resp: No-   shortness of breath with exertion or at rest.              No-   productive cough,  No non-productive cough,  No- coughing up of blood.              No-   change in color of mucus.  No- wheezing.   Skin: No-   rash or lesions. GI:  No-   heartburn, indigestion, abdominal pain, nausea, vomiting, GU:  MS:  No-   joint pain or swelling.  Neuro-     nothing unusual Psych:  No- change in mood or affect. No depression or anxiety.  No memory loss.    Objective:  OBJ- Physical Exam General- Alert, Oriented, Affect-appropriate, Distress- none acute Skin- rash-none, lesions- none, excoriation- none Lymphadenopathy- none Head- atraumatic            Eyes- Gross vision intact, PERRLA, conjunctivae and secretions clear            Ears- Hearing ok, +R TM retracted            Nose- Clear, no-Septal dev, mucus, polyps, erosion, perforation             Throat- Mallampati III , mucosa clear , drainage- none, tonsils- atrophic Neck- flexible , trachea midline, no stridor , thyroid nl, carotid no bruit Chest - symmetrical excursion , unlabored           Heart/CV- RRR , no murmur , no gallop  , no rub, nl s1 s2                           - JVD- none , edema- none, stasis changes- none, varices- none           Lung- clear to P&A, wheeze- none, cough+slight , dullness-none, rub- none           Chest wall-  Abd- Br/ Gen/ Rectal- Not done, not indicated Extrem- cyanosis- none, clubbing, none, atrophy- none, strength- nl.  Neuro- grossly intact to observation Assessment & Plan:

## 2014-02-21 NOTE — Patient Instructions (Signed)
Neb neo nasal  Depo 80  Please call as needed

## 2014-02-25 NOTE — Assessment & Plan Note (Signed)
R frontal sinus infection and eustachian dysfunction. She may be able to avoid antibiotics if we can get her opened up and drainaing Plan- nasal neb decongestant, depomedrol, saline rinse

## 2014-02-25 NOTE — Assessment & Plan Note (Signed)
Watching for onset of fall seasonal allergies is

## 2014-02-27 ENCOUNTER — Other Ambulatory Visit: Payer: Self-pay

## 2014-02-27 MED ORDER — VERAPAMIL HCL ER 240 MG PO TBCR: EXTENDED_RELEASE_TABLET | ORAL | Status: DC

## 2014-03-13 ENCOUNTER — Other Ambulatory Visit: Payer: Self-pay

## 2014-03-13 MED ORDER — SYNTHROID 50 MCG PO TABS: ORAL_TABLET | ORAL | Status: DC

## 2014-03-22 ENCOUNTER — Other Ambulatory Visit: Payer: Self-pay | Admitting: Internal Medicine

## 2014-04-07 ENCOUNTER — Other Ambulatory Visit: Payer: Self-pay

## 2014-04-07 MED ORDER — SYNTHROID 50 MCG PO TABS: ORAL_TABLET | ORAL | Status: DC

## 2014-09-07 ENCOUNTER — Ambulatory Visit (INDEPENDENT_AMBULATORY_CARE_PROVIDER_SITE_OTHER): Payer: MEDICARE | Admitting: Internal Medicine

## 2014-09-07 ENCOUNTER — Encounter (INDEPENDENT_AMBULATORY_CARE_PROVIDER_SITE_OTHER): Payer: Self-pay

## 2014-09-07 ENCOUNTER — Encounter: Payer: Self-pay | Admitting: Internal Medicine

## 2014-09-07 VITALS — BP 118/70 | HR 68 | Ht 63.0 in | Wt 179.4 lb

## 2014-09-07 DIAGNOSIS — J3089 Other allergic rhinitis: Principal | ICD-10-CM

## 2014-09-07 DIAGNOSIS — J0111 Acute recurrent frontal sinusitis: Secondary | ICD-10-CM

## 2014-09-07 DIAGNOSIS — J302 Other seasonal allergic rhinitis: Secondary | ICD-10-CM

## 2014-09-07 DIAGNOSIS — J309 Allergic rhinitis, unspecified: Secondary | ICD-10-CM | POA: Diagnosis not present

## 2014-09-07 MED ORDER — AZELASTINE-FLUTICASONE 137-50 MCG/ACT NA SUSP: NASAL | Status: DC

## 2014-09-07 NOTE — Patient Instructions (Signed)
Sample and script to try Dymista nasal spray instead of Flonase    1-2 puffs each nostril once daily at bedtime  Please call as needed   You can continue allegra/ otc antihistamine if you find you need it

## 2014-09-07 NOTE — Progress Notes (Signed)
Subjective:    Patient ID: DECKLYN HORNIK, female    DOB: December 03, 1940, 74 y.o.   MRN: [id number]  HPI 74 yo female with known hx of AR.   February 13, 2010 2:01 PMAcute OV CDY patient.Pt seen by CDY 01/31/10 rx pred pulse and taper. see hx above and pt concurs is accurate. After the prednisone was given the cough subsided significantly but still with persisten R ear pain. Also notes ear feels full. Sinuses are not stopped up. There is no thick secretions seen. There is less dyspnea. The pt completed course of prednisone. No ABX given.  She remains off symbicort and singulair. She was to have RAST assay with Dr Annamaria Boots but not done due to EMR being down on 01/31/10.  She has seen Dr Wilburn Cornelia in the past of ENT.  March 14, 2010-  Seen by Dr Joya Gaskins c/o pain R ear> Dr Erik Obey 4/00 > Cerumen removal didn't help. C/O if blows nose she feels "waves" in right ear. Original c/o cough is better- less deep and no longer daily or productive. In last 2-3 days more rhinorhea and postnasal drip, and still uncomfortable in right ear. Denies chest tightness, wheeze or dyspnea. No fever, no nasal congestion or discharge.  Allergy profile- elevated cat (daughter has cats). Total IgE 5.5  Sed rate 39, EOS 1.2%  May 30, 2010- Cough/ allergy  Nurse-CC: 2 month follow up visit-still having heaviness on chest with activiity. Denies any wheezing.  Pending left shoulder replacement next week by Dr Percell Miller. She continues to notice substernal heaviness that was not clarified by cardiac testing in the Spring, done in [place], including treadmill stress test and echocardiogram. . Occasional dry cough. Denies wheeze, palpitation, dysphagia or dyspnea.  Last seen by Dr. Annamaria Boots  05/2010   09/21/2012 Acute OV  Complains of recurrent sinus infections x5 months. Finished levaquin 1 week ago by ENT.   Has watery right eye, sneezing, head congestion w/ yellow/white mucus, runny nose, sinus pressure, sore throat x5days. Congestion is  mainly clear.  Has been on several abx . Over last few months.  Watery eyes , drippy nose  2 dogs/cats inside  Nasonex helps some.  No antihistamines have been used.  Post nasal drip is getting worse.  Dry ticking cough -rare  No chest pain, edema or n/v. No orthopnea.   11/04/12- 72 yoF former smoker followed for allergic rhinitis FOLLOWS FOR: last seen 2011; having achy feeling in upper head area; sometimes feels as though someone is jabbing with a knife in her eyes; having sore/painful ear, increased allergy flare up again. Remembers prednisone significantly helping when here with nurse practitioner March 25. Now in the past week symptoms are flaring again. Several rounds of antibiotics this winter. Describes retro-orbital pressure pain but blows out little. Left eye sharp pain. Right ear pops and hurts.  09/07/13-  53 yoF former smoker followed for allergic rhinitis ACUTE VISIT: ears feel clogged; recent sinus infection-was on augmentin. Chest heaviness, headaches, pressure in head, chest and sinus congestion. Pt states she feels like she has not gotten over the recent sinus infection. One month ago sinus infection, finished Augmentin 2 weeks ago. Frontal and bitemporal headache, it ears feel full. Mucus now clear but postnasal drip.  02/21/14- 73 yoF former smoker followed for allergic rhinitis, complicated by HBP, GERD, Anemia ACUTE VISIT: Right ear feels like an ocean in there and aches behind right eye. When blowing nose it's clear but causes extreme pain in Right ear. Chills but  denies fever. For 10 days feeling pressure R ear, retro-orbital ache. Some click and pop in ear. Hearing ok, chest ok. Dr Wilburn Cornelia did CT sinuses NEG last week. Sudafed overstims her.  09/07/14- 74 yoF former smoker followed for allergic rhinitis, complicated by HBP, GERD, Anemia FOLLOWS FOR:c/o nasal congestion in am-clear,green,brown. Runny nose,PND,no cough,forehead and behind eyes pr.,right ear feels full,no  fcs,headache everyday 2 weeks ago discolored, now clear. If blows hard, R ear feels pressure. Using flonase/ allegra.  ROS-see HPI Constitutional:   No-   weight loss, night sweats, fevers, chills, fatigue, lassitude. HEENT:   + headaches, difficulty swallowing, tooth/dental problems, sore throat,       No-  sneezing, itching, +ear ache, +nasal congestion, +post nasal drip,  CV:  No-   chest pain, orthopnea, PND, swelling in lower extremities, anasarca,  dizziness, palpitations Resp: No-   shortness of breath with exertion or at rest.              No-   productive cough,  No non-productive cough,  No- coughing up of blood.              No-   change in color of mucus.  No- wheezing.   Skin: No-   rash or lesions. GI:  No-   heartburn, indigestion, abdominal pain, nausea, vomiting, GU:  MS:  No-   joint pain or swelling.  Neuro-     nothing unusual Psych:  No- change in mood or affect. No depression or anxiety.  No memory loss.    Objective:  OBJ- Physical Exam General- Alert, Oriented, Affect-appropriate, Distress- none acute Skin- rash-none, lesions- none, excoriation- none Lymphadenopathy- none Head- atraumatic            Eyes- Gross vision intact, PERRLA, conjunctivae and secretions clear            Ears- Hearing ok, +R TM retracted            Nose- White mucus R nare, no-Septal dev,  polyps, erosion, perforation             Throat- Mallampati III , mucosa clear , drainage- none, tonsils- atrophic Neck- flexible , trachea midline, no stridor , thyroid nl, carotid no bruit Chest - symmetrical excursion , unlabored           Heart/CV- RRR , no murmur , no gallop  , no rub, nl s1 s2                           - JVD- none , edema- none, stasis changes- none, varices- none           Lung- clear to P&A, wheeze- none, cough+slight , dullness-none, rub- none           Chest wall-  Abd- Br/ Gen/ Rectal- Not done, not indicated Extrem- cyanosis- none, clubbing, none, atrophy- none,  strength- nl.  Neuro- grossly intact to observation Assessment & Plan:

## 2014-09-07 NOTE — Assessment & Plan Note (Signed)
Similar symptoms most visits. CT by Dr Wilburn Cornelia neg last year. She is not following closely with him. Now drainage bothers her most. Plan- try Dymista instead of Flonase. Consider skin testing if seasonally worse this spring.

## 2014-09-07 NOTE — Assessment & Plan Note (Signed)
Burtis Junes this being allergy or resolving infection, rather than acute bacterial infection Plan- meds reviewed. Try Dymista. Consider Skin testing.

## 2014-09-18 ENCOUNTER — Telehealth: Payer: Self-pay | Admitting: Internal Medicine

## 2014-09-18 MED ORDER — AZELASTINE-FLUTICASONE 137-50 MCG/ACT NA SUSP: NASAL | Status: DC

## 2014-09-18 NOTE — Telephone Encounter (Signed)
[id number] x1 

## 2014-09-18 NOTE — Telephone Encounter (Signed)
Patient needed rx called into pharmacy for Toco since she lost the paper prescription that was given at [facility].  Rx sent to pharmacy.  Patient notified.  Nothing further needed.

## 2014-09-18 NOTE — Telephone Encounter (Signed)
Pt returned call - [phone]

## 2014-12-28 ENCOUNTER — Other Ambulatory Visit (HOSPITAL_COMMUNITY): Payer: Self-pay | Admitting: Gastroenterology

## 2014-12-28 DIAGNOSIS — R6881 Early satiety: Secondary | ICD-10-CM

## 2014-12-28 DIAGNOSIS — R109 Unspecified abdominal pain: Secondary | ICD-10-CM

## 2015-01-11 ENCOUNTER — Ambulatory Visit (HOSPITAL_COMMUNITY)
Admission: RE | Admit: 2015-01-11 | Discharge: 2015-01-11 | Disposition: A | Discharge status: Home / Self Care | Payer: MEDICARE | Source: Ambulatory Visit | Attending: Gastroenterology | Admitting: Gastroenterology

## 2015-01-11 DIAGNOSIS — R6881 Early satiety: Secondary | ICD-10-CM | POA: Diagnosis not present

## 2015-01-11 DIAGNOSIS — R109 Unspecified abdominal pain: Secondary | ICD-10-CM

## 2015-01-11 DIAGNOSIS — Z9049 Acquired absence of other specified parts of digestive tract: Secondary | ICD-10-CM | POA: Diagnosis not present

## 2015-01-11 DIAGNOSIS — K30 Functional dyspepsia: Secondary | ICD-10-CM | POA: Insufficient documentation

## 2015-01-11 MED ORDER — TECHNETIUM TC 99M SULFUR COLLOID
2.0000 | Freq: Once | INTRAVENOUS | Status: AC | PRN
  Administered 2015-01-11: 2 via INTRAVENOUS

## 2015-01-17 ENCOUNTER — Telehealth: Payer: Self-pay | Admitting: Internal Medicine

## 2015-01-17 ENCOUNTER — Encounter: Payer: Self-pay | Admitting: Internal Medicine

## 2015-01-17 ENCOUNTER — Ambulatory Visit: Payer: MEDICARE | Admitting: Internal Medicine

## 2015-01-17 VITALS — BP 128/80 | HR 73 | Ht 63.0 in | Wt 168.0 lb

## 2015-01-17 DIAGNOSIS — J0111 Acute recurrent frontal sinusitis: Secondary | ICD-10-CM

## 2015-01-17 MED ORDER — METHYLPREDNISOLONE ACETATE 80 MG/ML IJ SUSP
80.0000 mg | Freq: Once | INTRAMUSCULAR | Status: AC
  Administered 2015-01-17: 80 mg via INTRAMUSCULAR

## 2015-01-17 MED ORDER — PHENYLEPHRINE HCL 1 % NA SOLN
3.0000 [drp] | Freq: Once | NASAL | Status: AC
  Administered 2015-01-17: 3 [drp] via NASAL

## 2015-01-17 NOTE — Patient Instructions (Signed)
Neb neo nasal  Depo 80  Ask at Pharmacy counter for Childrens' Sudafed.  I suggest 10 ml (1 or 2 teaspooons) every 8 hours as needed , as a decongestant..  By all means, see Dr Wilburn Cornelia again if you need to  Call for an antibiotic if you feel you need one.

## 2015-01-17 NOTE — Progress Notes (Signed)
Subjective:    Patient ID: Brenda Stephens, female    DOB: [birth year], 74 y.o.   MRN: [id number]  HPI 74 yo female with known hx of AR.   February 13, 2010 2:01 PMA[facility] OV CDY patient.Pt seen by CDY 01/31/10 rx pred pulse and taper. see hx above and pt concurs is accurate. After the prednisone was given the cough subsided significantly but still with persisten R ear pain. Also notes ear feels full. Sinuses are not stopped up. There is no thick secretions seen. There is less dyspnea. The pt completed course of prednisone. No ABX given.  She remains off symbicort and singulair. She was to have RAST assay with Dr Annamaria Boots but not done due to EMR being down on 01/31/10.  She has seen Dr Wilburn Cornelia in the past of ENT.  March 14, 2010-  Seen by Dr Joya Gaskins c/o pain R ear> Dr Erik Obey 4/09 > Cerumen removal didn't help. C/O if blows nose she feels "waves" in right ear. Original c/o cough is better- less deep and no longer daily or productive. In last 2-3 days more rhinorhea and postnasal drip, and still uncomfortable in right ear. Denies chest tightness, wheeze or dyspnea. No fever, no nasal congestion or discharge.  Allergy profile- elevated cat (daughter has cats). Total IgE 5.5  Sed rate 39, EOS 1.2%  May 30, 2010- Cough/ allergy  Nurse-CC: 2 month follow up visit-still having heaviness on chest with activiity. Denies any wheezing.  Pending left shoulder replacement next week by Dr Percell Miller. She continues to notice substernal heaviness that was not clarified by cardiac testing in the Spring, done in [place], including treadmill stress test and echocardiogram. . Occasional dry cough. Denies wheeze, palpitation, dysphagia or dyspnea.  Last seen by Dr. Annamaria Boots  05/2010   09/21/2012 Acute OV  Complains of recurrent sinus infections x5 months. Finished levaquin 1 week ago by ENT.   Has watery right eye, sneezing, head congestion w/ yellow/white mucus, runny nose, sinus pressure, sore throat x5days. Congestion is  mainly clear.  Has been on several abx . Over last few months.  Watery eyes , drippy nose  2 dogs/cats inside  Nasonex helps some.  No antihistamines have been used.  Post nasal drip is getting worse.  Dry ticking cough -rare  No chest pain, edema or n/v. No orthopnea.   11/04/12- 20 yoF former smoker followed for allergic rhinitis FOLLOWS FOR: last seen 2011; having achy feeling in upper head area; sometimes feels as though someone is jabbing with a knife in her eyes; having sore/painful ear, increased allergy flare up again. Remembers prednisone significantly helping when here with nurse practitioner March 25. Now in the past week symptoms are flaring again. Several rounds of antibiotics this winter. Describes retro-orbital pressure pain but blows out little. Left eye sharp pain. Right ear pops and hurts.  09/07/13-  20 yoF former smoker followed for allergic rhinitis ACUTE VISIT: ears feel clogged; recent sinus infection-was on augmentin. Chest heaviness, headaches, pressure in head, chest and sinus congestion. Pt states she feels like she has not gotten over the recent sinus infection. One month ago sinus infection, finished Augmentin 2 weeks ago. Frontal and bitemporal headache, it ears feel full. Mucus now clear but postnasal drip.  02/21/14- 73 yoF former smoker followed for allergic rhinitis, complicated by HBP, GERD, Anemia ACUTE VISIT: Right ear feels like an ocean in there and aches behind right eye. When blowing nose it's clear but causes extreme pain in Right ear. Chills but  denies fever. For 10 days feeling pressure R ear, retro-orbital ache. Some click and pop in ear. Hearing ok, chest ok. Dr Wilburn Cornelia did CT sinuses NEG last week. Sudafed overstims her.  09/07/14- 74 yoF former smoker followed for allergic rhinitis, complicated by HBP, GERD, Anemia FOLLOWS FOR:c/o nasal congestion in am-clear,green,brown. Runny nose,PND,no cough,forehead and behind eyes pr.,right ear feels full,no  fcs,headache everyday 2 weeks ago discolored, now clear. If blows hard, R ear feels pressure. Using flonase/ allegra.  01/17/15-74 yoF former smoker followed for allergic rhinitis, complicated by HBP, GERD, Anemia Reports runny nose; RT ear pops and feels full and hurts different times during day; RT side facial pressure in cheek and under eye;     ROS-see HPI Constitutional:   No-   weight loss, night sweats, fevers, chills, fatigue, lassitude. HEENT:   + headaches, difficulty swallowing, tooth/dental problems, sore throat,       No-  sneezing, itching, +ear ache, +nasal congestion, +post nasal drip,  CV:  No-   chest pain, orthopnea, PND, swelling in lower extremities, anasarca,  dizziness, palpitations Resp: No-   shortness of breath with exertion or at rest.              No-   productive cough,  No non-productive cough,  No- coughing up of blood.              No-   change in color of mucus.  No- wheezing.   Skin: No-   rash or lesions. GI:  No-   heartburn, indigestion, abdominal pain, nausea, vomiting, GU:  MS:  No-   joint pain or swelling.  Neuro-     nothing unusual Psych:  No- change in mood or affect. No depression or anxiety.  No memory loss.    Objective:  OBJ- Physical Exam General- Alert, Oriented, Affect-appropriate, Distress- none acute Skin- rash-none, lesions- none, excoriation- none Lymphadenopathy- none Head- atraumatic            Eyes- Gross vision intact, PERRLA, conjunctivae and secretions clear            Ears- Hearing ok, +R TM retracted            Nose- White mucus R nare, no-Septal dev,  polyps, erosion, perforation             Throat- Mallampati III , mucosa clear , drainage- none, tonsils- atrophic Neck- flexible , trachea midline, no stridor , thyroid nl, carotid no bruit Chest - symmetrical excursion , unlabored           Heart/CV- RRR , no murmur , no gallop  , no rub, nl s1 s2                           - JVD- none , edema- none, stasis changes-  none, varices- none           Lung- clear to P&A, wheeze- none, cough+slight , dullness-none, rub- none           Chest wall-  Abd- Br/ Gen/ Rectal- Not done, not indicated Extrem- cyanosis- none, clubbing, none, atrophy- none, strength- nl.  Neuro- grossly intact to observation Assessment & Plan:

## 2015-01-19 NOTE — Telephone Encounter (Signed)
Error per shelby

## 2015-05-16 ENCOUNTER — Other Ambulatory Visit: Payer: Self-pay | Admitting: Gastroenterology

## 2015-05-16 DIAGNOSIS — K921 Melena: Secondary | ICD-10-CM

## 2015-05-16 DIAGNOSIS — R1084 Generalized abdominal pain: Secondary | ICD-10-CM

## 2015-05-16 DIAGNOSIS — R634 Abnormal weight loss: Secondary | ICD-10-CM

## 2015-05-22 ENCOUNTER — Ambulatory Visit (INDEPENDENT_AMBULATORY_CARE_PROVIDER_SITE_OTHER): Payer: MEDICARE | Admitting: Internal Medicine

## 2015-05-22 ENCOUNTER — Encounter: Payer: Self-pay | Admitting: Internal Medicine

## 2015-05-22 VITALS — BP 118/64 | HR 50 | Ht 63.0 in | Wt 161.0 lb

## 2015-05-22 DIAGNOSIS — J0111 Acute recurrent frontal sinusitis: Secondary | ICD-10-CM

## 2015-05-22 DIAGNOSIS — J309 Allergic rhinitis, unspecified: Secondary | ICD-10-CM

## 2015-05-22 DIAGNOSIS — J3089 Other allergic rhinitis: Principal | ICD-10-CM

## 2015-05-22 DIAGNOSIS — J302 Other seasonal allergic rhinitis: Secondary | ICD-10-CM

## 2015-05-22 MED ORDER — METHYLPREDNISOLONE ACETATE 80 MG/ML IJ SUSP
80.0000 mg | Freq: Once | INTRAMUSCULAR | Status: AC
  Administered 2015-05-22: 80 mg via INTRAMUSCULAR

## 2015-05-22 NOTE — Addendum Note (Signed)
Addended by: Len Blalock on: 05/22/2015 10:04 AM   Modules accepted: Orders

## 2015-05-22 NOTE — Patient Instructions (Signed)
Neb neo nasal  Depo 80  Agree with Children's Sudafed and saline nasal spray

## 2015-05-22 NOTE — Assessment & Plan Note (Signed)
Probably not an allergic rhinitis related to weather, indoor heat, dust from leaves. Infection less likely. Plan-continue current meds. Nebulizer nasal decongestant, Depo-Medrol

## 2015-05-22 NOTE — Assessment & Plan Note (Signed)
Discussed indicators of infection. Less likely this is a bacterial infection this time.

## 2015-05-22 NOTE — Progress Notes (Signed)
Subjective:    Patient ID: Brenda Stephens, female    DOB: December 03, 1940, 74 y.o.   MRN: [id number]  HPI 74 yo female with known hx of AR.   February 13, 2010 2:01 PMAc[facility] OV CDY patient.Pt seen by CDY 01/31/10 rx pred pulse and taper. see hx above and pt concurs is accurate. After the prednisone was given the cough subsided significantly but still with persisten R ear pain. Also notes ear feels full. Sinuses are not stopped up. There is no thick secretions seen. There is less dyspnea. The pt completed course of prednisone. No ABX given.  She remains off symbicort and singulair. She was to have RAST assay with Dr Annamaria Boots but not done due to EMR being down on 01/31/10.  She has seen Dr Wilburn Cornelia in the past of ENT.  March 14, 2010-  Seen by Dr Joya Gaskins c/o pain R ear> Dr Erik Obey 4/00 > Cerumen removal didn't help. C/O if blows nose she feels "waves" in right ear. Original c/o cough is better- less deep and no longer daily or productive. In last 2-3 days more rhinorhea and postnasal drip, and still uncomfortable in right ear. Denies chest tightness, wheeze or dyspnea. No fever, no nasal congestion or discharge.  Allergy profile- elevated cat (daughter has cats). Total IgE 5.5  Sed rate 39, EOS 1.2%  May 30, 2010- Cough/ allergy  Nurse-CC: 2 month follow up visit-still having heaviness on chest with activiity. Denies any wheezing.  Pending left shoulder replacement next week by Dr Percell Miller. She continues to notice substernal heaviness that was not clarified by cardiac testing in the Spring, done in [place], including treadmill stress test and echocardiogram. . Occasional dry cough. Denies wheeze, palpitation, dysphagia or dyspnea.  Last seen by Dr. Annamaria Boots  05/2010   09/21/2012 Acute OV  Complains of recurrent sinus infections x5 months. Finished levaquin 1 week ago by ENT.   Has watery right eye, sneezing, head congestion w/ yellow/white mucus, runny nose, sinus pressure, sore throat x5days. Congestion is  mainly clear.  Has been on several abx . Over last few months.  Watery eyes , drippy nose  2 dogs/cats inside  Nasonex helps some.  No antihistamines have been used.  Post nasal drip is getting worse.  Dry ticking cough -rare  No chest pain, edema or n/v. No orthopnea.   11/04/12- 72 yoF former smoker followed for allergic rhinitis FOLLOWS FOR: last seen 2011; having achy feeling in upper head area; sometimes feels as though someone is jabbing with a knife in her eyes; having sore/painful ear, increased allergy flare up again. Remembers prednisone significantly helping when here with nurse practitioner March 25. Now in the past week symptoms are flaring again. Several rounds of antibiotics this winter. Describes retro-orbital pressure pain but blows out little. Left eye sharp pain. Right ear pops and hurts.  09/07/13-  53 yoF former smoker followed for allergic rhinitis ACUTE VISIT: ears feel clogged; recent sinus infection-was on augmentin. Chest heaviness, headaches, pressure in head, chest and sinus congestion. Pt states she feels like she has not gotten over the recent sinus infection. One month ago sinus infection, finished Augmentin 2 weeks ago. Frontal and bitemporal headache, it ears feel full. Mucus now clear but postnasal drip.  02/21/14- 73 yoF former smoker followed for allergic rhinitis, complicated by HBP, GERD, Anemia ACUTE VISIT: Right ear feels like an ocean in there and aches behind right eye. When blowing nose it's clear but causes extreme pain in Right ear. Chills but  denies fever. For 10 days feeling pressure R ear, retro-orbital ache. Some click and pop in ear. Hearing ok, chest ok. Dr Wilburn Cornelia did CT sinuses NEG last week. Sudafed overstims her.  09/07/14- 74 yoF former smoker followed for allergic rhinitis, complicated by HBP, GERD, Anemia FOLLOWS FOR:c/o nasal congestion in am-clear,green,brown. Runny nose,PND,no cough,forehead and behind eyes pr.,right ear feels full,no  fcs,headache everyday 2 weeks ago discolored, now clear. If blows hard, R ear feels pressure. Using flonase/ allegra.  01/17/15-74 yoF former smoker followed for allergic rhinitis, complicated by HBP, GERD, Anemia Reports runny nose; RT ear pops and feels full and hurts different times during day; RT side facial pressure in cheek and under eye;   05/22/2015-74 year old female former smoker followed for allergic rhinitis, complicated by HBP, GERD, anemia, hypothyroid FOLLOWS FOR: c/o b/l sinus congestion, pnd X1 week.  not able to blow anything through nose.  Postnasal drip, nasal congestion. Using Children's Sudafed and nasal saline spray. Denies having cold.  ROS-see HPI Constitutional:   No-   weight loss, night sweats, fevers, chills, fatigue, lassitude. HEENT:    headaches, difficulty swallowing, tooth/dental problems, sore throat,       No-  sneezing, itching, +ear ache, +nasal congestion, +post nasal drip,  CV:  No-   chest pain, orthopnea, PND, swelling in lower extremities, anasarca,  dizziness, palpitations Resp: No-   shortness of breath with exertion or at rest.              No-   productive cough,  No non-productive cough,  No- coughing up of blood.              No-   change in color of mucus.  No- wheezing.   Skin: No-   rash or lesions. GI:  No-   heartburn, indigestion, abdominal pain, nausea, vomiting, GU:  MS:  No-   joint pain or swelling.  Neuro-     nothing unusual Psych:  No- change in mood or affect. No depression or anxiety.  No memory loss.    Objective:  OBJ- Physical Exam General- Alert, Oriented, Affect-appropriate, Distress- none acute Skin- rash-none, lesions- none, excoriation- none Lymphadenopathy- none Head- atraumatic            Eyes- Gross vision intact, PERRLA, conjunctivae and secretions clear            Ears- Hearing ok,             Nose- + turbinate edema, + mucus crusting, no-Septal dev,  polyps, erosion, perforation             Throat-  Mallampati III , mucosa clear , drainage- none, tonsils- atrophic Neck- flexible , trachea midline, no stridor , thyroid nl, carotid no bruit Chest - symmetrical excursion , unlabored           Heart/CV- RRR , no murmur , no gallop  , no rub, nl s1 s2                           - JVD- none , edema- none, stasis changes- none, varices- none           Lung- clear to P&A, wheeze- none, cough+slight , dullness-none, rub- none           Chest wall-  Abd- Br/ Gen/ Rectal- Not done, not indicated Extrem- cyanosis- none, clubbing, none, atrophy- none, strength- nl.  Neuro- grossly intact to observation Assessment & Plan:

## 2015-05-28 ENCOUNTER — Ambulatory Visit
Admission: RE | Admit: 2015-05-28 | Discharge: 2015-05-28 | Disposition: A | Discharge status: Home / Self Care | Payer: MEDICARE | Source: Ambulatory Visit | Attending: Gastroenterology | Admitting: Gastroenterology

## 2015-05-28 DIAGNOSIS — R634 Abnormal weight loss: Secondary | ICD-10-CM

## 2015-05-28 DIAGNOSIS — R1084 Generalized abdominal pain: Secondary | ICD-10-CM

## 2015-05-28 DIAGNOSIS — K921 Melena: Secondary | ICD-10-CM

## 2015-05-28 MED ORDER — IOPAMIDOL (ISOVUE-300) INJECTION 61%
100.0000 mL | Freq: Once | INTRAVENOUS | Status: AC | PRN
  Administered 2015-05-28: 100 mL via INTRAVENOUS

## 2015-06-20 ENCOUNTER — Encounter: Payer: Self-pay | Admitting: Gastroenterology

## 2015-06-21 ENCOUNTER — Encounter: Payer: Self-pay | Admitting: Gastroenterology

## 2015-06-21 ENCOUNTER — Encounter: Payer: Self-pay | Admitting: Internal Medicine

## 2016-02-10 NOTE — Progress Notes (Signed)
Cardiology Office Note   Date:  02/11/2016   ID:  Brenda Stephens, DOB 1941-03-20, MRN [id number]  PCP:  Dyann Ruddle, MD  Cardiologist:   [name], MD   Chief Complaint  Patient presents with  . Palpitations      History of Present Illness: Brenda Stephens is a 75 y.o. female who presents for evaluation of palpitations. She was noticed to have these on a recent GYN exam. She otherwise doesn't really notice disease. She's very active exercising routinely. She does water aerobics and walks. She does not bring on any cardiovascular symptoms. The patient denies any new symptoms such as chest discomfort, neck or arm discomfort. There has been no new shortness of breath, PND or orthopnea. There have been no reported palpitations, presyncope or syncope.  I last saw the patient in 2015.  I had seen her in the past for evaluation of chest pain and HTN.  The patient had a negative perfusion study in 2013.    Past Medical History:  Diagnosis Date  . Allergy    latex  . Anemia   . Arthritis   . Dyslipidemia   . Eustachian tube dysfunction   . GERD (gastroesophageal reflux disease)   . Hx of cardiovascular stress test    Myoview 10/13:  apical thinning, EF 72%, no ischemia  . Hx of colonic polyps    Dr Watt Climes  . Hypertension   . Other abnormal glucose     Past Surgical History:  Procedure Laterality Date  . ABDOMINAL HYSTERECTOMY     BSO for uterine fibroids  . ACHILLES TENDON SURGERY Right 10/07/2012   Procedure: RIGHT REPAIR RUPTURE TENDON PRIMARY OPEN/PERCUTANEOUS ;  Surgeon: Ninetta Lights, MD;  Location: [facility];  Service: Orthopedics;  Laterality: Right;  . APPENDECTOMY    . CATARACT EXTRACTION, BILATERAL    . CHOLECYSTECTOMY    . COLONOSCOPY W/ POLYPECTOMY     Dr Watt Climes  . EAR CYST EXCISION Right 10/07/2012   Procedure: EXCISION BONE CYST BENIGN TUMOR [name] ;  Surgeon: Ninetta Lights, MD;  Location: [facility];   Service: Orthopedics;  Laterality: Right;  . EXPLORATORY LAPAROTOMY    . SINUS SURGERY WITH INSTATRAK    . TOTAL SHOULDER REPLACEMENT     Right shoulder  . 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 TUBAL LIGATION       Current Outpatient Prescriptions  Medication Sig Dispense Refill  . atorvastatin (LIPITOR) 20 MG tablet TAKE 1 TABLET BY MOUTH AT BEDTIME 90 tablet 1  . celecoxib (CELEBREX) 200 MG capsule TAKE ONE CAPSULE BY MOUTH AS NEEDED ONLY **NOT TO BE TAKEN REGULARLY DUE TO GI & CARDIAC RISK** PT IS TAKING ALMOST EVERY DAY    . dicyclomine (BENTYL) 10 MG capsule Take 10 mg by mouth 4 (four) times daily -  before meals and at bedtime.    Marland Kitchen esomeprazole (NEXIUM 24HR) 20 MG capsule Take 20 mg by mouth daily at 12 noon.    . methocarbamol (ROBAXIN) 750 MG tablet Take 750 mg by mouth 2 (two) times daily.    . metoprolol succinate (TOPROL-XL) 50 MG 24 hr tablet Take 1 tablet (50 mg total) by mouth daily. 90 tablet 3  . Polyethyl Glycol-Propyl Glycol (SYSTANE OP) Place 1 drop into both eyes 4 (four) times daily.    . Pseudoephedrine HCl (CHILDRENS SUDAFED PO) Take by mouth. 1 tsp qam    . Saline (AYR  SALINE NASAL DROPS) 0.65 % (SOLN) SOLN Place 1 spray into the nose 3 (three) times daily.    Marland Kitchen spironolactone-hydrochlorothiazide (ALDACTAZIDE) 25-25 MG per tablet Take 1 tablet by mouth daily. 90 tablet 3  . SYNTHROID 50 MCG tablet PT NEEDS COMPLETE PHYSICAL.  TAKE 1 TABLET (50MCG TOTAL) BY MOUTH DAILY. 30 tablet 0  . venlafaxine XR (EFFEXOR-XR) 150 MG 24 hr capsule TAKE 1 CAPSULE (150 MG TOTAL) BY MOUTH DAILY. 90 capsule 2  . verapamil (CALAN-SR) 240 MG CR tablet TAKE 1 TABLET (240 MG TOTAL) BY MOUTH AT BEDTIME. 30 tablet 0  . zolpidem (AMBIEN) 10 MG tablet Take 5 mg by mouth at bedtime.     No current facility-administered medications for this visit.     Allergies:   Latex; Aspirin; Bactrim [sulfamethoxazole-trimethoprim]; and Oxycodone-acetaminophen    ROS:  Please see the history  of present illness.   Otherwise, review of systems are positive for none.   All other systems are reviewed and negative.    PHYSICAL EXAM: VS:  BP 122/64 (BP Location: Left Arm, Patient Position: Sitting, Cuff Size: Normal)   Pulse (!) 58   Ht 5' 3.5" (1.613 m)   Wt 167 lb 4 oz (75.9 kg)   BMI 29.16 kg/m  , BMI Body mass index is 29.16 kg/m. GENERAL:  Well appearing HEENT:  Pupils equal round and reactive, fundi not visualized, oral mucosa unremarkable NECK:  No jugular venous distention, waveform within normal limits, carotid upstroke brisk and symmetric, no bruits, no thyromegaly LYMPHATICS:  No cervical, inguinal adenopathy LUNGS:  Clear to auscultation bilaterally BACK:  No CVA tenderness CHEST:  Unremarkable HEART:  PMI not displaced or sustained,S1 and S2 within normal limits, no S3, no S4, no clicks, no rubs, no murmurs ABD:  Flat, positive bowel sounds normal in frequency in pitch, no bruits, no rebound, no guarding, no midline pulsatile mass, no hepatomegaly, no splenomegaly EXT:  2 plus pulses throughout, no edema, no cyanosis no clubbing SKIN:  No rashes no nodules NEURO:  Cranial nerves II through XII grossly intact, motor grossly intact throughout PSYCH:  Cognitively intact, oriented to person place and time    EKG:  EKG is  ordered today. The ekg ordered today demonstrates sinus rhythm, rate 53, axis within normal limits, intervals within normal limits, no acute ST wave changes.   Recent Labs: No results found for requested labs within last 8760 hours.    Lipid Panel    Component Value Date/Time   CHOL 208 (H) 07/19/2012 1520   TRIG 152.0 (H) 07/19/2012 1520   HDL 70.30 07/19/2012 1520   CHOLHDL 3 07/19/2012 1520   VLDL 30.4 07/19/2012 1520   LDLCALC 91 06/13/2011 1134   LDLDIRECT 108.4 07/19/2012 1520      Wt Readings from Last 3 Encounters:  02/11/16 167 lb 4 oz (75.9 kg)  05/22/15 161 lb (73 kg)  01/17/15 168 lb (76.2 kg)      Other studies  Reviewed: Additional studies/ records that were reviewed today include: none . Review of the above records demonstrates:  Please see elsewhere in the note.     ASSESSMENT AND PLAN:  PALPITATIONS:  These are not particularly symptomatic.  She has had a negative cardiac work up in the past.  She is very active and exercises daily.  The possibility of structural heart disease contributing to significant dysrhythmias is low. No change in therapy is suggested. No further evaluation is warranted.  HTN:  The blood pressure is at  target. No change in medications is indicated. We will continue with therapeutic lifestyle changes (TLC).   Current medicines are reviewed at length with the patient today.  The patient does not have concerns regarding medicines.  The following changes have been made:  no change  Labs/ tests ordered today include:   Orders Placed This Encounter  Procedures  . EKG 12-Lead     Disposition:   FU with no    Signed, [name], MD  02/11/2016 12:23 PM    [facility]

## 2016-02-11 ENCOUNTER — Encounter (INDEPENDENT_AMBULATORY_CARE_PROVIDER_SITE_OTHER): Payer: Self-pay

## 2016-02-11 ENCOUNTER — Encounter: Payer: Self-pay | Admitting: Cardiology

## 2016-02-11 ENCOUNTER — Ambulatory Visit (INDEPENDENT_AMBULATORY_CARE_PROVIDER_SITE_OTHER): Payer: MEDICARE | Admitting: Cardiology

## 2016-02-11 VITALS — BP 122/64 | HR 58 | Ht 63.5 in | Wt 167.2 lb

## 2016-02-11 DIAGNOSIS — I10 Essential (primary) hypertension: Secondary | ICD-10-CM | POA: Diagnosis not present

## 2016-02-11 DIAGNOSIS — R002 Palpitations: Secondary | ICD-10-CM | POA: Diagnosis not present

## 2016-02-11 NOTE — Patient Instructions (Signed)
Your physician recommends that you schedule a follow-up appointment in: AS NEEDED  

## 2017-01-28 DIAGNOSIS — C801 Malignant (primary) neoplasm, unspecified: Secondary | ICD-10-CM

## 2017-01-28 HISTORY — DX: Malignant (primary) neoplasm, unspecified: C80.1

## 2017-02-09 ENCOUNTER — Other Ambulatory Visit: Payer: Self-pay | Admitting: Radiology

## 2017-02-11 ENCOUNTER — Telehealth: Payer: Self-pay | Admitting: *Deleted

## 2017-02-11 NOTE — Telephone Encounter (Signed)
Confirmed [facility] for 02/18/17 at 815am .  Instructions and contact information given.

## 2017-02-13 ENCOUNTER — Telehealth: Payer: Self-pay | Admitting: *Deleted

## 2017-02-13 NOTE — Telephone Encounter (Signed)
Received call from patient stating she has done some research and would like to see Dr. Donne Hazel instead of coming to clinic.  I have notified [facility] and they will get her an appointment with Dr. Donne Hazel.  Encouraged her to call with any needs or concerns

## 2017-02-24 ENCOUNTER — Encounter: Payer: Self-pay | Admitting: Genetics

## 2017-02-26 ENCOUNTER — Other Ambulatory Visit: Payer: Self-pay | Admitting: General Surgery

## 2017-02-26 DIAGNOSIS — C50411 Malignant neoplasm of upper-outer quadrant of right female breast: Secondary | ICD-10-CM

## 2017-02-26 DIAGNOSIS — Z170 Estrogen receptor positive status [ER+]: Principal | ICD-10-CM

## 2017-03-10 ENCOUNTER — Telehealth: Payer: Self-pay | Admitting: Oncology

## 2017-03-10 ENCOUNTER — Encounter (HOSPITAL_BASED_OUTPATIENT_CLINIC_OR_DEPARTMENT_OTHER)
Admission: RE | Admit: 2017-03-10 | Discharge: 2017-03-10 | Disposition: A | Discharge status: Home / Self Care | Payer: MEDICARE | Source: Ambulatory Visit | Attending: General Surgery | Admitting: General Surgery

## 2017-03-10 ENCOUNTER — Encounter (HOSPITAL_BASED_OUTPATIENT_CLINIC_OR_DEPARTMENT_OTHER): Payer: Self-pay | Admitting: *Deleted

## 2017-03-10 DIAGNOSIS — R7309 Other abnormal glucose: Secondary | ICD-10-CM | POA: Diagnosis not present

## 2017-03-10 DIAGNOSIS — D649 Anemia, unspecified: Secondary | ICD-10-CM | POA: Diagnosis not present

## 2017-03-10 DIAGNOSIS — E039 Hypothyroidism, unspecified: Secondary | ICD-10-CM | POA: Diagnosis not present

## 2017-03-10 DIAGNOSIS — I10 Essential (primary) hypertension: Secondary | ICD-10-CM | POA: Insufficient documentation

## 2017-03-10 DIAGNOSIS — E785 Hyperlipidemia, unspecified: Secondary | ICD-10-CM | POA: Diagnosis not present

## 2017-03-10 DIAGNOSIS — Z01810 Encounter for preprocedural cardiovascular examination: Secondary | ICD-10-CM | POA: Diagnosis present

## 2017-03-10 DIAGNOSIS — F329 Major depressive disorder, single episode, unspecified: Secondary | ICD-10-CM | POA: Diagnosis not present

## 2017-03-10 DIAGNOSIS — Z01812 Encounter for preprocedural laboratory examination: Secondary | ICD-10-CM | POA: Insufficient documentation

## 2017-03-10 DIAGNOSIS — F419 Anxiety disorder, unspecified: Secondary | ICD-10-CM | POA: Diagnosis not present

## 2017-03-10 DIAGNOSIS — R7301 Impaired fasting glucose: Secondary | ICD-10-CM | POA: Insufficient documentation

## 2017-03-10 DIAGNOSIS — R001 Bradycardia, unspecified: Secondary | ICD-10-CM | POA: Diagnosis not present

## 2017-03-10 DIAGNOSIS — Z853 Personal history of malignant neoplasm of breast: Secondary | ICD-10-CM | POA: Insufficient documentation

## 2017-03-10 LAB — BASIC METABOLIC PANEL
ANION GAP: 8 (ref 5–15)
BUN: 16 mg/dL (ref 6–20)
CHLORIDE: 98 mmol/L — AB (ref 101–111)
CO2: 25 mmol/L (ref 22–32)
Calcium: 9.6 mg/dL (ref 8.9–10.3)
Creatinine, Ser: 0.89 mg/dL (ref 0.44–1.00)
GFR calc Af Amer: >60 mL/min (ref >60)
GLUCOSE: 102 mg/dL — AB (ref 65–99)
POTASSIUM: 4.2 mmol/L (ref 3.5–5.1)
Sodium: 131 mmol/L — ABNORMAL LOW (ref 135–145)

## 2017-03-10 NOTE — Progress Notes (Addendum)
Pt given Ensure drink and instructed to drink by 1000 day of surgery with teach back method. Dr.Ellender reviewed EKG, ok for surgery.

## 2017-03-10 NOTE — Telephone Encounter (Signed)
Appt has been scheduled for the pt to see Dr. Jana Hakim on 9/14 at 130pm. Pt aware to arrive 30 minutes early for labs.

## 2017-03-12 ENCOUNTER — Other Ambulatory Visit: Payer: Self-pay

## 2017-03-12 DIAGNOSIS — C50919 Malignant neoplasm of unspecified site of unspecified female breast: Secondary | ICD-10-CM

## 2017-03-13 ENCOUNTER — Telehealth: Payer: Self-pay

## 2017-03-13 ENCOUNTER — Ambulatory Visit (HOSPITAL_BASED_OUTPATIENT_CLINIC_OR_DEPARTMENT_OTHER): Payer: MEDICARE | Admitting: Oncology

## 2017-03-13 ENCOUNTER — Other Ambulatory Visit (HOSPITAL_BASED_OUTPATIENT_CLINIC_OR_DEPARTMENT_OTHER): Payer: MEDICARE

## 2017-03-13 DIAGNOSIS — C50411 Malignant neoplasm of upper-outer quadrant of right female breast: Secondary | ICD-10-CM

## 2017-03-13 DIAGNOSIS — C50919 Malignant neoplasm of unspecified site of unspecified female breast: Secondary | ICD-10-CM

## 2017-03-13 DIAGNOSIS — Z170 Estrogen receptor positive status [ER+]: Secondary | ICD-10-CM

## 2017-03-13 LAB — CBC WITH DIFFERENTIAL/PLATELET
BASO%: 1 % (ref 0.0–2.0)
BASOS ABS: 0.1 10*3/uL (ref 0.0–0.1)
EOS%: 2.9 % (ref 0.0–7.0)
Eosinophils Absolute: 0.2 10*3/uL (ref 0.0–0.5)
HEMATOCRIT: 38.3 % (ref 34.8–46.6)
HGB: 13.1 g/dL (ref 11.6–15.9)
LYMPH#: 1.4 10*3/uL (ref 0.9–3.3)
LYMPH%: 20.7 % (ref 14.0–49.7)
MCH: 31.2 pg (ref 25.1–34.0)
MCHC: 34.3 g/dL (ref 31.5–36.0)
MCV: 91.1 fL (ref 79.5–101.0)
MONO#: 0.7 10*3/uL (ref 0.1–0.9)
MONO%: 11.5 % (ref 0.0–14.0)
NEUT#: 4.2 10*3/uL (ref 1.5–6.5)
NEUT%: 63.9 % (ref 38.4–76.8)
PLATELETS: 236 10*3/uL (ref 145–400)
RBC: 4.21 10*6/uL (ref 3.70–5.45)
RDW: 12.3 % (ref 11.2–14.5)
WBC: 6.5 10*3/uL (ref 3.9–10.3)

## 2017-03-13 LAB — COMPREHENSIVE METABOLIC PANEL
ALT: 18 U/L (ref 0–55)
ANION GAP: 9 meq/L (ref 3–11)
AST: 18 U/L (ref 5–34)
Albumin: 4 g/dL (ref 3.5–5.0)
Alkaline Phosphatase: 71 U/L (ref 40–150)
BUN: 14.8 mg/dL (ref 7.0–26.0)
CALCIUM: 10 mg/dL (ref 8.4–10.4)
CHLORIDE: 97 meq/L — AB (ref 98–109)
CO2: 26 mEq/L (ref 22–29)
Creatinine: 0.8 mg/dL (ref 0.6–1.1)
EGFR: 68 mL/min/{1.73_m2} — ABNORMAL LOW (ref >90)
Glucose: 107 mg/dl (ref 70–140)
POTASSIUM: 4.3 meq/L (ref 3.5–5.1)
Sodium: 132 mEq/L — ABNORMAL LOW (ref 136–145)
Total Bilirubin: 0.54 mg/dL (ref 0.20–1.20)
Total Protein: 7.2 g/dL (ref 6.4–8.3)

## 2017-03-13 LAB — DRAW EXTRA CLOT TUBE

## 2017-03-13 NOTE — Telephone Encounter (Signed)
Printed avs and calender for upcoming appointment for October. Per 9/14 los

## 2017-03-13 NOTE — Progress Notes (Signed)
[name]  [phone] [phone]     ID: SUMMERS BUENDIA DOB: [birth year]  MR#: [id number]  GYK#:[id number]  Patient Care Team: Brenda Ruddle, MD as PCP - General (Internal Medicine) Brenda Cruel, MD OTHER MD:  CHIEF COMPLAINT: Estrogen receptor positive breast cancer  CURRENT TREATMENT: Awaiting definitive surgery   HISTORY OF CURRENT ILLNESS: Brenda Stephens had routine bilateral screening mammography at [facility] 01/28/2017. An irregular lesion in the right breast upper outer quadrant was detected and she was recalled for right diagnostic mammography and ultrasonography 02/05/2017. This found the breast density to be category B. In the right breast upper quadrant there was an irregular mass which by ultrasound measured 0.8 cm. It was located at the 11:00 radiant posteriorly. The right axilla was sonographically benign.  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  The patient's subsequent history is as detailed below.  INTERVAL HISTORY: Brenda Stephens was evaluated in the breast clinic 03/13/2017 accompanied by her husband. Her case was also presented at the multidisciplinary breast cancer conference 02/25/2017. At that time a preliminary plan was proposed: Breast conserving surgery with sentinel lymph node sampling and anti-estrogens--adjuvant radiation was felt likely not to be necessary   REVIEW OF SYSTEMS: There were no specific symptoms leading to the original mammogram, which was routinely scheduled. The patient denies unusual headaches, visual changes, nausea, vomiting, stiff neck, dizziness, or gait imbalance. There has been no cough, phlegm production, or pleurisy, no chest pain  or pressure, and no change in bowel or bladder habits. The patient denies fever, rash, bleeding, unexplained fatigue or unexplained weight loss. A detailed review of systems was otherwise entirely negative.   PAST MEDICAL HISTORY: Past Medical History:  Diagnosis Date  . Allergy    latex  . Anemia   . Anxiety   . Arthritis   . Cancer ([facility]) 01/2017   right breast cancer  . Depression   . Dyslipidemia   . Eustachian tube dysfunction   . GERD (gastroesophageal reflux disease)   . Hx of cardiovascular stress test    Myoview 10/13:  apical thinning, EF 72%, no ischemia  . Hx of colonic polyps    Dr Brenda Stephens  . Hypertension   . Hypothyroidism   . Other abnormal glucose     PAST SURGICAL HISTORY: Past Surgical History:  Procedure Laterality Date  . ABDOMINAL HYSTERECTOMY     BSO for uterine fibroids  . ACHILLES TENDON SURGERY Right 10/07/2012   Procedure: RIGHT REPAIR RUPTURE TENDON PRIMARY OPEN/PERCUTANEOUS ;  Surgeon: Brenda Lights, MD;  Location: [facility];  Service: Orthopedics;  Laterality: Right;  . APPENDECTOMY    . CATARACT EXTRACTION, BILATERAL    . CHOLECYSTECTOMY    . COLONOSCOPY W/ POLYPECTOMY     Dr Brenda Stephens  . EAR CYST EXCISION Right 10/07/2012   Procedure: EXCISION BONE CYST BENIGN TUMOR Brenda Stephens ;  Surgeon: Brenda Lights, MD;  Location: [facility];  Service: Orthopedics;  Laterality: Right;  . EXPLORATORY LAPAROTOMY    . SINUS SURGERY WITH INSTATRAK    . TOTAL SHOULDER REPLACEMENT     Right shoulder  . 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 TUBAL LIGATION      FAMILY HISTORY  Family History  Problem Relation Age of Onset  . Hiatal hernia Mother   . Heart attack Mother 54  . Heart disease Father        CAD @ autopsy  . Cancer Father        renal cancer  . Heart attack Brother 67       fatal  . Diabetes Neg Hx   . Stroke Neg Hx   The patient's father died from kidney cancer at age 13. The patient's mother  died from a heart attack at age 72. The patient had one brother, who died from a heart attack. She had no sisters. There is no history of breast or ovarian cancer in the family.  GYNECOLOGIC HISTORY:  No LMP recorded. Patient has had a hysterectomy. Menarche age 21, first live birth age 46, the patient is GX P1. She underwent total abdominal hysterectomy with bilateral salpingo-oophorectomy remotely. She used hormone replacement between 5 and 10 years.  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 Her husband was Mudlogger of [place] for railroad company. He is retired but currently overseas the [employer] property as a Freight forwarder. Their daughter Brenda Stephens is an attorney but currently works as a Education officer, museum in [place]. The patient has no grandchildren. She is not a church attender    ADVANCED DIRECTIVES:    HEALTH MAINTENANCE: Social History  Substance Use Topics  . Smoking status: Former Smoker    Packs/day: 0.50    Years: 6.00    Types: Cigarettes    Quit date: 06/30/1962  . Smokeless tobacco: Never Used     Comment: Quit age 41  . Alcohol use 1.8 oz/week    3 Glasses of wine per week     Comment: wine  : < 3 glasses weekly     Colonoscopy:May got  PAP: Status post hysterectomy  Bone density: Due/[facility]   Allergies  Allergen Reactions  . Latex     rash  . Aspirin     GI bleeding  . Oxycodone-Acetaminophen     nausea    Current Outpatient Prescriptions  Medication Sig Dispense Refill  . atorvastatin (LIPITOR) 20 MG tablet TAKE 1 TABLET BY MOUTH AT BEDTIME 90 tablet 1  . dicyclomine (BENTYL) 10 MG capsule Take 10 mg by mouth 4 (four) times daily -  before meals and at bedtime.    Marland Kitchen esomeprazole (NEXIUM 24HR) 20 MG capsule Take 20 mg by mouth daily at 12 noon.    . meloxicam (MOBIC) 15 MG tablet Take 15 mg by mouth daily.    . metoprolol succinate (TOPROL-XL) 50 MG 24 hr tablet Take 1 tablet (50 mg total) by mouth daily. 90 tablet 3  .  Polyethyl Glycol-Propyl Glycol (SYSTANE OP) Place 1 drop into both eyes 4 (four) times daily.    . Saline (AYR SALINE NASAL DROPS) 0.65 % (SOLN) SOLN Place 1 spray into the nose 3 (three) times daily.    Marland Kitchen spironolactone-hydrochlorothiazide (ALDACTAZIDE) 25-25 MG per tablet Take 1 tablet by mouth daily. 90 tablet 3  . SYNTHROID 50 MCG tablet PT NEEDS COMPLETE PHYSICAL.  TAKE 1 TABLET (50MCG TOTAL) BY MOUTH DAILY. 30 tablet 0  . venlafaxine XR (EFFEXOR-XR) 150 MG 24 hr capsule TAKE 1 CAPSULE (150 MG TOTAL) BY MOUTH DAILY. 90 capsule 2  . verapamil (CALAN-SR) 240 MG CR tablet TAKE 1 TABLET (240 MG TOTAL) BY MOUTH AT BEDTIME. 30 tablet 0  . zolpidem (AMBIEN) 10 MG  tablet Take 5 mg by mouth at bedtime.     No current facility-administered medications for this visit.     OBJECTIVE: Older white woman in no acute distress  Vitals:   03/13/17 1309  BP: (!) 161/57  Pulse: (!) 56  Resp: 17  Temp: 97.8 F (36.6 C)  SpO2: 100%     Body mass index is 32.47 kg/m.   Wt Readings from Last 3 Encounters:  03/13/17 183 lb 4.8 oz (83.1 kg)  02/11/16 167 lb 4 oz (75.9 kg)  05/22/15 161 lb (73 kg)      ECOG FS:0 - Asymptomatic  Ocular: Sclerae unicteric, pupils round and equal Ear-nose-throat: Oropharynx clear and moist Lymphatic: No cervical or supraclavicular adenopathy Lungs no rales or rhonchi Heart regular rate and rhythm Abd soft, nontender, positive bowel sounds MSK no focal spinal tenderness, no joint edema Neuro: non-focal, well-oriented, appropriate affect Breasts: The right breast is status post recent biopsy. There is no significant ecchymosis. There are no palpable masses. There are no skin or nipple changes of concern. The left breast is unremarkable. Both axillae are benign.   LAB RESULTS:  CMP     Component Value Date/Time   NA 131 (L) 03/10/2017 1500   K 4.2 03/10/2017 1500   CL 98 (L) 03/10/2017 1500   CO2 25 03/10/2017 1500   GLUCOSE 102 (H) 03/10/2017 1500   BUN 16  03/10/2017 1500   CREATININE 0.89 03/10/2017 1500   CALCIUM 9.6 03/10/2017 1500   PROT 7.3 07/19/2012 1520   ALBUMIN 4.2 07/19/2012 1520   AST 16 07/19/2012 1520   ALT 19 07/19/2012 1520   ALKPHOS 68 07/19/2012 1520   BILITOT 0.7 07/19/2012 1520   GFRNONAA >60 03/10/2017 1500   GFRAA >60 03/10/2017 1500    No results found for: TOTALPROTELP, ALBUMINELP, A1GS, A2GS, BETS, BETA2SER, GAMS, MSPIKE, SPEI  No results found for: KPAFRELGTCHN, LAMBDASER, [facility]  Lab Results  Component Value Date   WBC 6.5 03/13/2017   NEUTROABS 4.2 03/13/2017   HGB 13.1 03/13/2017   HCT 38.3 03/13/2017   MCV 91.1 03/13/2017   PLT 236 03/13/2017      Chemistry      Component Value Date/Time   NA 131 (L) 03/10/2017 1500   K 4.2 03/10/2017 1500   CL 98 (L) 03/10/2017 1500   CO2 25 03/10/2017 1500   BUN 16 03/10/2017 1500   CREATININE 0.89 03/10/2017 1500      Component Value Date/Time   CALCIUM 9.6 03/10/2017 1500   ALKPHOS 68 07/19/2012 1520   AST 16 07/19/2012 1520   ALT 19 07/19/2012 1520   BILITOT 0.7 07/19/2012 1520       No results found for: LABCA2  No components found for: [id number]  No results for input(s): INR in the last 168 hours.  No results found for: LABCA2  No results found for: EXH371  No results found for: IRC789  No results found for: FYB017  No results found for: CA2729  No components found for: HGQUANT  No results found for: CEA1 / No results found for: CEA1   No results found for: AFPTUMOR  No results found for: CHROMOGRNA  No results found for: PSA1  Appointment on 03/13/2017  Component Date Value Ref Range Status  . WBC 03/13/2017 6.5  3.9 - 10.3 10e3/uL Final  . NEUT# 03/13/2017 4.2  1.5 - 6.5 10e3/uL Final  . HGB 03/13/2017 13.1  11.6 - 15.9 g/dL Final  . HCT 03/13/2017 38.3  34.8 - 46.6 % Final  . Platelets 03/13/2017 236  145 - 400 10e3/uL Final  . MCV 03/13/2017 91.1  79.5 - 101.0 fL Final  . MCH 03/13/2017 31.2  25.1 - 34.0 pg  Final  . MCHC 03/13/2017 34.3  31.5 - 36.0 g/dL Final  . RBC 03/13/2017 4.21  3.70 - 5.45 10e6/uL Final  . RDW 03/13/2017 12.3  11.2 - 14.5 % Final  . lymph# 03/13/2017 1.4  0.9 - 3.3 10e3/uL Final  . MONO# 03/13/2017 0.7  0.1 - 0.9 10e3/uL Final  . Eosinophils Absolute 03/13/2017 0.2  0.0 - 0.5 10e3/uL Final  . Basophils Absolute 03/13/2017 0.1  0.0 - 0.1 10e3/uL Final  . NEUT% 03/13/2017 63.9  38.4 - 76.8 % Final  . LYMPH% 03/13/2017 20.7  14.0 - 49.7 % Final  . MONO% 03/13/2017 11.5  0.0 - 14.0 % Final  . EOS% 03/13/2017 2.9  0.0 - 7.0 % Final  . BASO% 03/13/2017 1.0  0.0 - 2.0 % Final    (this displays the last labs from the last 3 days)  No results found for: TOTALPROTELP, ALBUMINELP, A1GS, A2GS, BETS, BETA2SER, GAMS, MSPIKE, SPEI (this displays SPEP labs)  No results found for: KPAFRELGTCHN, LAMBDASER, KAPLAMBRATIO (kappa/lambda light chains)  No results found for: HGBA, HGBA2QUANT, HGBFQUANT, HGBSQUAN (Hemoglobinopathy evaluation)   No results found for: LDH  Lab Results  Component Value Date   IRON 59 12/18/2010   IRONPCTSAT 12.5 (L) 12/18/2010   (Iron and TIBC)  No results found for: FERRITIN  Urinalysis    Component Value Date/Time   COLORURINE YELLOW 06/04/2010 0845   APPEARANCEUR CLOUDY (A) 06/04/2010 0845   LABSPEC 1.011 06/04/2010 0845   PHURINE 7.5 06/04/2010 0845   GLUCOSEU NEGATIVE 06/04/2010 0845   HGBUR NEGATIVE 06/04/2010 0845   BILIRUBINUR NEGATIVE 06/04/2010 0845   KETONESUR NEGATIVE 06/04/2010 0845   PROTEINUR NEGATIVE 06/04/2010 0845   UROBILINOGEN 0.2 06/04/2010 0845   NITRITE NEGATIVE 06/04/2010 0845   LEUKOCYTESUR LARGE (A) 06/04/2010 0845     STUDIES: Outside studies reviewed with the patient  ELIGIBLE FOR AVAILABLE RESEARCH PROTOCOL: no  ASSESSMENT: 76 y.o. [place] woman status post left breast upper outer quadrant biopsy 02/09/2017 for a clinical T1b N0, stage IA invasive ductal carcinoma, grade 1, estrogen and progesterone  receptor positive, HER-2 nonamplified, with an MIB-1 of 2%  (1) breast conserving surgery with sentinel lymph node sampling scheduled for 03/16/2017  (2) adjuvant radiation not anticipated  (3) anti-estrogens for a minimum of 5 years.  (a) status post remote hysterectomy with bilateral salpingo-oophorectomy  PLAN: We spent the better part of today's hour-long appointment discussing the biology of her diagnosis and the specifics of her situation. We first reviewed the fact that cancer is not one disease but more than 100 different diseases and that it is important to keep them separate-- otherwise when friends and relatives discuss their own cancer experiences with [place] confusion can result. Similarly we explained that if breast cancer spreads to the bone or liver, the patient would not have bone cancer or liver cancer, but breast cancer in the bone and breast cancer in the liver: one cancer in three places-- not 3 different cancers which otherwise would have to be treated in 3 different ways.  We discussed the difference between local and systemic therapy. In terms of loco-regional treatment, lumpectomy plus radiation is equivalent to mastectomy as far as survival is concerned. For this reason, because of the decreased rate of complications and because the cosmetic  results are generally superior, we recommend breast conserving surgery. This is already scheduled, for 03/16/2017.  Adjuvant radiation may reduce the already low risk of local recurrence in cases like hers, but does not affect mortality. If she takes antiestrogen's for 5 years, the benefit of adjuvant radiation (assuming negative lymph nodes and negative margins postop) is likely to be low enough she may safely forgo it. That would be her choice.  We then discussed the rationale for systemic therapy. There is some risk that this cancer may have already spread to other parts of her body. Patients frequently ask at this point about bone  scans, CAT scans and PET scans to find out if they have occult breast cancer somewhere else. The problem is that in early stage disease we are much more likely to find false positives then true cancers and this would expose the patient to unnecessary procedures as well as unnecessary radiation. Scans cannot answer the question the patient really would like to know, which is whether she has microscopic disease elsewhere in her body. For those reasons we do not recommend them.  Of course we would proceed to aggressive evaluation of any symptoms that might suggest metastatic disease, but that is not the case here.  Next we went over the options for systemic therapy which are anti-estrogens, anti-HER-2 immunotherapy, and chemotherapy. Jayden does not meet criteria for anti-HER-2 immunotherapy. She is a good candidate for anti-estrogens.  The question of chemotherapy is more complicated. Chemotherapy is most effective in rapidly growing, aggressive tumors. It is much less effective in low-grade, slow growing cancers, like [place] 's. Accordingly, at this point we are planning to forego Oncotype testing and move directly from surgery to anti-estrogens  We did not discuss in detail the difference between anastrozole and tamoxifen. It will be helpful for her to have an updated bone density scan before making that decision. Note however that she is status post cataract surgery and status post hysterectomy and also that she took hormone replacement for many years with no complications. All those would tilt the balance towards tamoxifen.  Donnae has a good understanding of the overall plan. She agrees with it. She knows the goal of treatment in her case is cure. She will call with any problems that may develop before her next visit here.  Brenda Cruel, MD   03/13/2017 1:25 PM Medical Oncology and Hematology [facility] [address] [place], [place] [address] Tel. [phone]    Fax.  [phone]

## 2017-03-16 ENCOUNTER — Ambulatory Visit (HOSPITAL_BASED_OUTPATIENT_CLINIC_OR_DEPARTMENT_OTHER)
Admission: RE | Admit: 2017-03-16 | Discharge: 2017-03-16 | Disposition: A | Discharge status: Home / Self Care | Payer: MEDICARE | Source: Ambulatory Visit | Attending: General Surgery | Admitting: General Surgery

## 2017-03-16 ENCOUNTER — Ambulatory Visit (HOSPITAL_COMMUNITY)
Admission: RE | Admit: 2017-03-16 | Discharge: 2017-03-16 | Disposition: A | Discharge status: Home / Self Care | Payer: MEDICARE | Source: Ambulatory Visit | Attending: General Surgery | Admitting: General Surgery

## 2017-03-16 ENCOUNTER — Ambulatory Visit (HOSPITAL_BASED_OUTPATIENT_CLINIC_OR_DEPARTMENT_OTHER): Payer: MEDICARE | Admitting: Anesthesiology

## 2017-03-16 ENCOUNTER — Encounter (HOSPITAL_BASED_OUTPATIENT_CLINIC_OR_DEPARTMENT_OTHER): Payer: Self-pay | Admitting: Emergency Medicine

## 2017-03-16 ENCOUNTER — Encounter (HOSPITAL_BASED_OUTPATIENT_CLINIC_OR_DEPARTMENT_OTHER)
Admission: RE | Disposition: A | Discharge status: Home / Self Care | Payer: Self-pay | Source: Ambulatory Visit | Attending: General Surgery

## 2017-03-16 DIAGNOSIS — F419 Anxiety disorder, unspecified: Secondary | ICD-10-CM | POA: Diagnosis not present

## 2017-03-16 DIAGNOSIS — Z885 Allergy status to narcotic agent status: Secondary | ICD-10-CM | POA: Insufficient documentation

## 2017-03-16 DIAGNOSIS — N6011 Diffuse cystic mastopathy of right breast: Secondary | ICD-10-CM | POA: Diagnosis not present

## 2017-03-16 DIAGNOSIS — Z886 Allergy status to analgesic agent status: Secondary | ICD-10-CM | POA: Insufficient documentation

## 2017-03-16 DIAGNOSIS — Z79899 Other long term (current) drug therapy: Secondary | ICD-10-CM | POA: Insufficient documentation

## 2017-03-16 DIAGNOSIS — Z87891 Personal history of nicotine dependence: Secondary | ICD-10-CM | POA: Insufficient documentation

## 2017-03-16 DIAGNOSIS — I10 Essential (primary) hypertension: Secondary | ICD-10-CM | POA: Insufficient documentation

## 2017-03-16 DIAGNOSIS — C50411 Malignant neoplasm of upper-outer quadrant of right female breast: Secondary | ICD-10-CM | POA: Insufficient documentation

## 2017-03-16 DIAGNOSIS — K219 Gastro-esophageal reflux disease without esophagitis: Secondary | ICD-10-CM | POA: Diagnosis not present

## 2017-03-16 DIAGNOSIS — Z91040 Latex allergy status: Secondary | ICD-10-CM | POA: Diagnosis not present

## 2017-03-16 DIAGNOSIS — M1990 Unspecified osteoarthritis, unspecified site: Secondary | ICD-10-CM | POA: Insufficient documentation

## 2017-03-16 DIAGNOSIS — Z170 Estrogen receptor positive status [ER+]: Principal | ICD-10-CM

## 2017-03-16 DIAGNOSIS — F329 Major depressive disorder, single episode, unspecified: Secondary | ICD-10-CM | POA: Insufficient documentation

## 2017-03-16 DIAGNOSIS — Z882 Allergy status to sulfonamides status: Secondary | ICD-10-CM | POA: Diagnosis not present

## 2017-03-16 DIAGNOSIS — Z8719 Personal history of other diseases of the digestive system: Secondary | ICD-10-CM | POA: Insufficient documentation

## 2017-03-16 DIAGNOSIS — E7800 Pure hypercholesterolemia, unspecified: Secondary | ICD-10-CM | POA: Diagnosis not present

## 2017-03-16 DIAGNOSIS — E039 Hypothyroidism, unspecified: Secondary | ICD-10-CM | POA: Insufficient documentation

## 2017-03-16 HISTORY — DX: Depression, unspecified: F32.A

## 2017-03-16 HISTORY — PX: BREAST LUMPECTOMY WITH RADIOACTIVE SEED AND SENTINEL LYMPH NODE BIOPSY: SHX6550

## 2017-03-16 HISTORY — DX: Hypothyroidism, unspecified: E03.9

## 2017-03-16 HISTORY — DX: Major depressive disorder, single episode, unspecified: F32.9

## 2017-03-16 HISTORY — DX: Malignant (primary) neoplasm, unspecified: C80.1

## 2017-03-16 HISTORY — DX: Anxiety disorder, unspecified: F41.9

## 2017-03-16 SURGERY — BREAST LUMPECTOMY WITH RADIOACTIVE SEED AND SENTINEL LYMPH NODE BIOPSY
Anesthesia: General | Site: Breast | Laterality: Right

## 2017-03-16 MED ORDER — LACTATED RINGERS IV SOLN
INTRAVENOUS | Status: DC
  Administered 2017-03-16 (×2): via INTRAVENOUS

## 2017-03-16 MED ORDER — HYDROMORPHONE HCL 1 MG/ML IJ SOLN: 0.2500 mg | INTRAMUSCULAR | Status: DC | PRN

## 2017-03-16 MED ORDER — TECHNETIUM TC 99M SULFUR COLLOID FILTERED
1.0000 | Freq: Once | INTRAVENOUS | Status: AC | PRN
  Administered 2017-03-16: 1 via INTRADERMAL

## 2017-03-16 MED ORDER — ACETAMINOPHEN 500 MG PO TABS
ORAL_TABLET | ORAL | Status: AC
  Filled 2017-03-16: qty 2

## 2017-03-16 MED ORDER — PROPOFOL 10 MG/ML IV BOLUS
INTRAVENOUS | Status: DC | PRN
  Administered 2017-03-16: 200 mg via INTRAVENOUS

## 2017-03-16 MED ORDER — ONDANSETRON HCL 4 MG/2ML IJ SOLN
INTRAMUSCULAR | Status: AC
  Filled 2017-03-16: qty 2

## 2017-03-16 MED ORDER — MIDAZOLAM HCL 2 MG/2ML IJ SOLN
1.0000 mg | INTRAMUSCULAR | Status: DC | PRN
  Administered 2017-03-16: 2 mg via INTRAVENOUS

## 2017-03-16 MED ORDER — ONDANSETRON HCL 4 MG/2ML IJ SOLN
INTRAMUSCULAR | Status: DC | PRN
  Administered 2017-03-16: 4 mg via INTRAVENOUS

## 2017-03-16 MED ORDER — GLYCOPYRROLATE 0.2 MG/ML IV SOSY
PREFILLED_SYRINGE | INTRAVENOUS | Status: DC | PRN
  Administered 2017-03-16: .2 mg via INTRAVENOUS

## 2017-03-16 MED ORDER — EPHEDRINE SULFATE-NACL 50-0.9 MG/10ML-% IV SOSY
PREFILLED_SYRINGE | INTRAVENOUS | Status: DC | PRN
  Administered 2017-03-16 (×2): 10 mg via INTRAVENOUS

## 2017-03-16 MED ORDER — DEXAMETHASONE SODIUM PHOSPHATE 4 MG/ML IJ SOLN
INTRAMUSCULAR | Status: DC | PRN
  Administered 2017-03-16: 10 mg via INTRAVENOUS

## 2017-03-16 MED ORDER — CEFAZOLIN SODIUM-DEXTROSE 2-4 GM/100ML-% IV SOLN
INTRAVENOUS | Status: AC
  Filled 2017-03-16: qty 100

## 2017-03-16 MED ORDER — OXYCODONE HCL 5 MG PO TABS
5.0000 mg | ORAL_TABLET | Freq: Once | ORAL | Status: AC
  Administered 2017-03-16: 5 mg via ORAL

## 2017-03-16 MED ORDER — BUPIVACAINE HCL (PF) 0.5 % IJ SOLN
INTRAMUSCULAR | Status: DC | PRN
  Administered 2017-03-16: 5 mL

## 2017-03-16 MED ORDER — DEXAMETHASONE SODIUM PHOSPHATE 10 MG/ML IJ SOLN
INTRAMUSCULAR | Status: AC
  Filled 2017-03-16: qty 1

## 2017-03-16 MED ORDER — GABAPENTIN 300 MG PO CAPS
ORAL_CAPSULE | ORAL | Status: AC
  Filled 2017-03-16: qty 1

## 2017-03-16 MED ORDER — GABAPENTIN 300 MG PO CAPS
300.0000 mg | ORAL_CAPSULE | ORAL | Status: AC
  Administered 2017-03-16: 300 mg via ORAL

## 2017-03-16 MED ORDER — FENTANYL CITRATE (PF) 100 MCG/2ML IJ SOLN
50.0000 ug | INTRAMUSCULAR | Status: DC | PRN
  Administered 2017-03-16: 100 ug via INTRAVENOUS
  Administered 2017-03-16: 50 ug via INTRAVENOUS

## 2017-03-16 MED ORDER — METHYLENE BLUE 0.5 % INJ SOLN
INTRAVENOUS | Status: AC
  Filled 2017-03-16: qty 10

## 2017-03-16 MED ORDER — OXYCODONE HCL 5 MG PO TABS: 5.0000 mg | ORAL_TABLET | Freq: Four times a day (QID) | ORAL | 0 refills | Status: DC | PRN

## 2017-03-16 MED ORDER — ROPIVACAINE HCL 5 MG/ML IJ SOLN
INTRAMUSCULAR | Status: DC | PRN
  Administered 2017-03-16: 150 mg

## 2017-03-16 MED ORDER — SODIUM CHLORIDE 0.9 % IJ SOLN
INTRAMUSCULAR | Status: AC
  Filled 2017-03-16: qty 10

## 2017-03-16 MED ORDER — BUPIVACAINE HCL (PF) 0.5 % IJ SOLN
INTRAMUSCULAR | Status: AC
  Filled 2017-03-16: qty 30

## 2017-03-16 MED ORDER — LIDOCAINE 2% (20 MG/ML) 5 ML SYRINGE
INTRAMUSCULAR | Status: DC | PRN
  Administered 2017-03-16: 100 mg via INTRAVENOUS

## 2017-03-16 MED ORDER — FENTANYL CITRATE (PF) 100 MCG/2ML IJ SOLN
INTRAMUSCULAR | Status: AC
  Filled 2017-03-16: qty 2

## 2017-03-16 MED ORDER — ACETAMINOPHEN 500 MG PO TABS
1000.0000 mg | ORAL_TABLET | ORAL | Status: AC
  Administered 2017-03-16: 1000 mg via ORAL

## 2017-03-16 MED ORDER — CEFAZOLIN SODIUM-DEXTROSE 2-4 GM/100ML-% IV SOLN
2.0000 g | INTRAVENOUS | Status: AC
  Administered 2017-03-16: 2 g via INTRAVENOUS

## 2017-03-16 MED ORDER — MIDAZOLAM HCL 2 MG/2ML IJ SOLN
INTRAMUSCULAR | Status: AC
  Filled 2017-03-16: qty 2

## 2017-03-16 MED ORDER — OXYCODONE HCL 5 MG PO TABS
ORAL_TABLET | ORAL | Status: AC
  Filled 2017-03-16: qty 1

## 2017-03-16 MED ORDER — LIDOCAINE 2% (20 MG/ML) 5 ML SYRINGE
INTRAMUSCULAR | Status: AC
  Filled 2017-03-16: qty 5

## 2017-03-16 MED ORDER — EPHEDRINE 5 MG/ML INJ
INTRAVENOUS | Status: AC
  Filled 2017-03-16: qty 10

## 2017-03-16 MED ORDER — SCOPOLAMINE 1 MG/3DAYS TD PT72: 1.0000 | MEDICATED_PATCH | Freq: Once | TRANSDERMAL | Status: DC | PRN

## 2017-03-16 MED ORDER — PROMETHAZINE HCL 25 MG/ML IJ SOLN: 6.2500 mg | INTRAMUSCULAR | Status: DC | PRN

## 2017-03-16 SURGICAL SUPPLY — 64 items
ADH SKN CLS APL DERMABOND .7 (GAUZE/BANDAGES/DRESSINGS) ×1
APPLIER CLIP 9.375 MED OPEN (MISCELLANEOUS) ×3
APR CLP MED 9.3 20 MLT OPN (MISCELLANEOUS) ×1
BINDER BREAST LRG (GAUZE/BANDAGES/DRESSINGS) IMPLANT
BINDER BREAST MEDIUM (GAUZE/BANDAGES/DRESSINGS) IMPLANT
BINDER BREAST XLRG (GAUZE/BANDAGES/DRESSINGS) ×2 IMPLANT
BINDER BREAST XXLRG (GAUZE/BANDAGES/DRESSINGS) IMPLANT
BLADE SURG 15 STRL LF DISP TIS (BLADE) ×1 IMPLANT
BLADE SURG 15 STRL SS (BLADE) ×3
CANISTER SUC SOCK COL 7IN (MISCELLANEOUS) IMPLANT
CANISTER SUCT 1200ML W/VALVE (MISCELLANEOUS) IMPLANT
CHLORAPREP W/TINT 26ML (MISCELLANEOUS) ×3 IMPLANT
CLIP APPLIE 9.375 MED OPEN (MISCELLANEOUS) IMPLANT
CLIP VESOCCLUDE SM WIDE 6/CT (CLIP) ×3 IMPLANT
CLOSURE WOUND 1/2 X4 (GAUZE/BANDAGES/DRESSINGS) ×1
COVER BACK TABLE 60X90IN (DRAPES) ×3 IMPLANT
COVER MAYO STAND STRL (DRAPES) ×3 IMPLANT
COVER PROBE W GEL 5X96 (DRAPES) ×1 IMPLANT
DECANTER SPIKE VIAL GLASS SM (MISCELLANEOUS) IMPLANT
DERMABOND ADVANCED (GAUZE/BANDAGES/DRESSINGS) ×2
DERMABOND ADVANCED .7 DNX12 (GAUZE/BANDAGES/DRESSINGS) ×1 IMPLANT
DEVICE DUBIN W/COMP PLATE 8390 (MISCELLANEOUS) ×3 IMPLANT
DRAPE LAPAROSCOPIC ABDOMINAL (DRAPES) ×3 IMPLANT
DRAPE UTILITY XL STRL (DRAPES) ×3 IMPLANT
ELECT COATED BLADE 2.86 ST (ELECTRODE) ×3 IMPLANT
ELECT REM PT RETURN 9FT ADLT (ELECTROSURGICAL) ×3
ELECTRODE REM PT RTRN 9FT ADLT (ELECTROSURGICAL) ×1 IMPLANT
GLOVE BIO SURGEON STRL SZ7 (GLOVE) ×6 IMPLANT
GLOVE BIOGEL PI IND STRL 7.0 (GLOVE) IMPLANT
GLOVE BIOGEL PI IND STRL 7.5 (GLOVE) ×1 IMPLANT
GLOVE BIOGEL PI INDICATOR 7.0 (GLOVE) ×2
GLOVE BIOGEL PI INDICATOR 7.5 (GLOVE) ×4
GLOVE SURG SS PI 6.5 STRL IVOR (GLOVE) ×2 IMPLANT
GOWN STRL REUS W/ TWL LRG LVL3 (GOWN DISPOSABLE) ×2 IMPLANT
GOWN STRL REUS W/TWL LRG LVL3 (GOWN DISPOSABLE) ×6
HEMOSTAT ARISTA ABSORB 3G PWDR (MISCELLANEOUS) IMPLANT
ILLUMINATOR WAVEGUIDE N/F (MISCELLANEOUS) ×2 IMPLANT
KIT MARKER MARGIN INK (KITS) ×3 IMPLANT
LIGHT WAVEGUIDE WIDE FLAT (MISCELLANEOUS) IMPLANT
NDL HYPO 25X1 1.5 SAFETY (NEEDLE) ×1 IMPLANT
NDL SAFETY ECLIPSE 18X1.5 (NEEDLE) IMPLANT
NEEDLE HYPO 18GX1.5 SHARP (NEEDLE)
NEEDLE HYPO 25X1 1.5 SAFETY (NEEDLE) ×3 IMPLANT
NS IRRIG 1000ML POUR BTL (IV SOLUTION) IMPLANT
PACK BASIN DAY SURGERY FS (CUSTOM PROCEDURE TRAY) ×3 IMPLANT
PENCIL BUTTON HOLSTER BLD 10FT (ELECTRODE) ×3 IMPLANT
SLEEVE SCD COMPRESS KNEE MED (MISCELLANEOUS) ×3 IMPLANT
SPONGE LAP 4X18 X RAY DECT (DISPOSABLE) ×5 IMPLANT
STRIP CLOSURE SKIN 1/2X4 (GAUZE/BANDAGES/DRESSINGS) ×2 IMPLANT
SUT ETHILON 2 0 FS 18 (SUTURE) IMPLANT
SUT MNCRL AB 4-0 PS2 18 (SUTURE) ×3 IMPLANT
SUT MON AB 5-0 PS2 18 (SUTURE) IMPLANT
SUT SILK 2 0 SH (SUTURE) ×2 IMPLANT
SUT VIC AB 2-0 SH 27 (SUTURE) ×3
SUT VIC AB 2-0 SH 27XBRD (SUTURE) ×1 IMPLANT
SUT VIC AB 3-0 SH 27 (SUTURE) ×3
SUT VIC AB 3-0 SH 27X BRD (SUTURE) ×1 IMPLANT
SUT VIC AB 5-0 PS2 18 (SUTURE) IMPLANT
SYR CONTROL 10ML LL (SYRINGE) ×3 IMPLANT
TOWEL OR 17X24 6PK STRL BLUE (TOWEL DISPOSABLE) ×3 IMPLANT
TOWEL OR NON WOVEN STRL DISP B (DISPOSABLE) ×3 IMPLANT
TUBE CONNECTING 20'X1/4 (TUBING)
TUBE CONNECTING 20X1/4 (TUBING) IMPLANT
YANKAUER SUCT BULB TIP NO VENT (SUCTIONS) IMPLANT

## 2017-03-16 NOTE — Transfer of Care (Signed)
Immediate Anesthesia Transfer of Care Note  Patient: Brenda Stephens  Procedure(s) Performed: Procedure(s) with comments: RIGHT BREAST LUMPECTOMY WITH RADIOACTIVE SEED AND RIGHT AXILLARY SENTINEL  NODE BIOPSY ERAS PATHWAY (Right) - PECTORAL BLOCK  Patient Location: PACU  Anesthesia Type:GA combined with regional for post-op pain  Level of Consciousness: awake, sedated and responds to stimulation  Airway & Oxygen Therapy: Patient Spontanous Breathing and Patient connected to nasal cannula oxygen  Post-op Assessment: Report given to RN and Post -op Vital signs reviewed and stable  Post vital signs: Reviewed and stable  Last Vitals:  Vitals:   03/16/17 1330 03/16/17 1345  BP: 133/63 126/69  Pulse: 61 61  Resp:    Temp:    SpO2: 96% 94%    Last Pain:  Vitals:   03/16/17 1216  TempSrc: Oral  PainSc: 0-No pain         Complications: No apparent anesthesia complications

## 2017-03-16 NOTE — Anesthesia Postprocedure Evaluation (Signed)
Anesthesia Post Note  Patient: Brenda Stephens  Procedure(s) Performed: Procedure(s) (LRB): RIGHT BREAST LUMPECTOMY WITH RADIOACTIVE SEED AND RIGHT AXILLARY SENTINEL  NODE BIOPSY ERAS PATHWAY (Right)     Patient location during evaluation: PACU Anesthesia Type: General Level of consciousness: sedated Pain management: pain level controlled Vital Signs Assessment: post-procedure vital signs reviewed and stable Respiratory status: spontaneous breathing and respiratory function stable Cardiovascular status: stable Postop Assessment: no apparent nausea or vomiting Anesthetic complications: no    Last Vitals:  Vitals:   03/16/17 1530 03/16/17 1600  BP: (!) 145/75 (!) 155/67  Pulse: 77 78  Resp: 15 18  Temp:  36.5 C  SpO2: 100% 95%    Last Pain:  Vitals:   03/16/17 1600  TempSrc:   PainSc: 9                  Paolina Karwowski DANIEL

## 2017-03-16 NOTE — Anesthesia Preprocedure Evaluation (Addendum)
Anesthesia Evaluation  Patient identified by MRN, date of birth, ID band Patient awake    Reviewed: Allergy & Precautions, NPO status , Patient's Chart, lab work & pertinent test results, reviewed documented beta blocker date and time   History of Anesthesia Complications Negative for: history of anesthetic complications  Airway Mallampati: II  TM Distance: >3 FB Neck ROM: Full    Dental no notable dental hx. (+) Dental Advisory Given   Pulmonary former smoker,    Pulmonary exam normal        Cardiovascular hypertension, Pt. on medications and Pt. on home beta blockers negative cardio ROS Normal cardiovascular exam     Neuro/Psych PSYCHIATRIC DISORDERS Anxiety Depression negative neurological ROS     GI/Hepatic Neg liver ROS, GERD  ,  Endo/Other  Hypothyroidism   Renal/GU negative Renal ROS  negative genitourinary   Musculoskeletal negative musculoskeletal ROS (+)   Abdominal   Peds negative pediatric ROS (+)  Hematology negative hematology ROS (+)   Anesthesia Other Findings   Reproductive/Obstetrics negative OB ROS                            Anesthesia Physical Anesthesia Plan  ASA: III  Anesthesia Plan: General   Post-op Pain Management: GA combined w/ Regional for post-op pain   Induction: Intravenous  PONV Risk Score and Plan: 3 and Ondansetron, Dexamethasone and Scopolamine patch - Pre-op  Airway Management Planned: LMA  Additional Equipment:   Intra-op Plan:   Post-operative Plan: Extubation in OR  Informed Consent: I have reviewed the patients History and Physical, chart, labs and discussed the procedure including the risks, benefits and alternatives for the proposed anesthesia with the patient or authorized representative who has indicated his/her understanding and acceptance.   Dental advisory given  Plan Discussed with: CRNA, Anesthesiologist and  Surgeon  Anesthesia Plan Comments:        Anesthesia Quick Evaluation

## 2017-03-16 NOTE — Anesthesia Procedure Notes (Signed)
Procedure Name: LMA Insertion Date/Time: 03/16/2017 2:06 PM Performed by: Lyndee Leo Pre-anesthesia Checklist: Patient identified, Emergency Drugs available, Suction available and Patient being monitored Patient Re-evaluated:Patient Re-evaluated prior to induction Oxygen Delivery Method: Circle system utilized Preoxygenation: Pre-oxygenation with 100% oxygen Induction Type: IV induction Ventilation: Mask ventilation without difficulty LMA: LMA inserted LMA Size: 4.0 Number of attempts: 1 Airway Equipment and Method: Bite block Placement Confirmation: positive ETCO2 Tube secured with: Tape Dental Injury: Teeth and Oropharynx as per pre-operative assessment

## 2017-03-16 NOTE — H&P (Signed)
76 yof referred by Dr Isaiah Blakes for newly diagnosed right breast cancer. she has no personal history. no family history of breast or ovarian cancer. does not feel mass, no nipple dc. she was noted on screening mm to have b density breasts and had an irregular lesion in the right breast. US showed an 8 mm lesion in the ruoq. [place] of axilla is negative. US guided biopsy of the mass was done with clip placement. this was a grade I IDC that is er pos at 100, pr pos at 61, her2 neg, ki is 2%. she is here with her husband and her daughter to discuss options   Past Surgical History Malachy Moan, [place]; 02/26/2017 2:33 PM) Appendectomy  Breast Biopsy  Right. Cataract Surgery  Bilateral. Colon Polyp Removal - Colonoscopy  Foot Surgery  Right. Gallbladder Surgery - Open  Hysterectomy (not due to cancer) - Partial  Oral Surgery  Shoulder Surgery  Bilateral.  Diagnostic Studies History Malachy Moan, RMA; 02/26/2017 2:33 PM) Colonoscopy  5-10 years ago Mammogram  within last year Pap Smear  1-5 years ago  Allergies Malachy Moan, RMA; 02/26/2017 2:35 PM) Aspirin *ANALGESICS - NonNarcotic*  Latex  OxyCODONE HCl ER *ANALGESICS - OPIOID*  Sulfamethoxazole *SULFONAMIDES*   Medication History Malachy Moan, RMA; 02/26/2017 2:38 PM) Dicyclomine HCl (10MG Capsule, Oral) Active. Synthroid (50MCG Tablet, Oral) Active. Meloxicam (15MG Tablet, Oral) Active. Spironolactone (25MG Tablet, Oral) Active. Metoprolol Succinate (Oral) Specific strength unknown - Active. Verapamil HCl ER (CO) (240MG Tablet ER 24HR, Oral) Active. Venlafaxine HCl ER (150MG Capsule ER 24HR, Oral) Active. Zolpidem Tartrate (5MG Tablet, Oral) Active. Atorvastatin Calcium (20MG Tablet, Oral) Active. NexIUM (Oral) Specific strength unknown - Active. One Daily 50 Plus (Oral) Active. Medications Reconciled  Social History Malachy Moan, [place]; 02/26/2017 2:33 PM) Alcohol use  Occasional  alcohol use. Caffeine use  Carbonated beverages, Coffee. Tobacco use  Former smoker.  Family History Malachy Moan, [place]; 02/26/2017 2:33 PM) Arthritis  Father. Colon Polyps  Father. Heart disease in female family member before age 37  Hypertension  Mother. Kidney Disease  Father.  Pregnancy / Birth History Malachy Moan, [place]; 02/26/2017 2:33 PM) Age at menarche  23 years. Age of menopause  1-60 Contraceptive History  Oral contraceptives. Gravida  1 Maternal age  73-30 Para  1  Other Problems Malachy Moan, [place]; 02/26/2017 2:33 PM) Anxiety Disorder  Arthritis  Back Pain  Breast Cancer  Depression  Diverticulosis  Gastroesophageal Reflux Disease  Hemorrhoids  High blood pressure  Hypercholesterolemia  Lump In Breast  Oophorectomy  Right. Thyroid Disease     Review of Systems Malachy Moan RMA; 02/26/2017 2:33 PM) General Present- Chills, Fatigue and Weight Gain. Not Present- Appetite Loss, Fever, Night Sweats and Weight Loss. Skin Present- Change in Wart/Mole and Dryness. Not Present- Hives, Jaundice, New Lesions, Non-Healing Wounds, Rash and Ulcer. HEENT Present- Seasonal Allergies, Sinus Pain, Visual Disturbances and Wears glasses/contact lenses. Not Present- Earache, Hearing Loss, Hoarseness, Nose Bleed, Oral Ulcers, Ringing in the Ears, Sore Throat and Yellow Eyes. Cardiovascular Present- Shortness of Breath and Swelling of Extremities. Not Present- Chest Pain, Difficulty Breathing Lying Down, Leg Cramps, Palpitations and Rapid Heart Rate. Gastrointestinal Present- Abdominal Pain, Constipation, Hemorrhoids and Indigestion. Not Present- Bloating, Bloody Stool, Change in Bowel Habits, Chronic diarrhea, Difficulty Swallowing, Excessive gas, Gets full quickly at meals, Nausea, Rectal Pain and Vomiting. Female Genitourinary Present- Frequency. Not Present- Nocturia, Painful Urination, Pelvic Pain and Urgency. Musculoskeletal Present- Back  Pain, Joint Pain and Muscle Weakness. Not Present- Joint  Stiffness, Muscle Pain and Swelling of Extremities. Neurological Present- Headaches and Tingling. Not Present- Decreased Memory, Fainting, Numbness, Seizures, Tremor, Trouble walking and Weakness. Psychiatric Present- Anxiety and Depression. Not Present- Bipolar, Change in Sleep Pattern, Fearful and Frequent crying. Endocrine Present- Heat Intolerance. Not Present- Cold Intolerance, Excessive Hunger, Hair Changes, Hot flashes and New Diabetes. Hematology Present- Easy Bruising. Not Present- Blood Thinners, Excessive bleeding, Gland problems, HIV and Persistent Infections.  Vitals Malachy Moan RMA; 02/26/2017 2:39 PM) 02/26/2017 2:38 PM Weight: 181 lb Height: 63in Body Surface Area: 1.85 m Body Mass Index: 32.06 kg/m  Temp.: 97.58F  Pulse: 61 (Regular)  BP: 142/70 (Sitting, Left Arm, Standard)       Physical Exam [name] MD; 02/26/2017 3:44 PM) General Mental Status-Alert. Orientation-Oriented X3.  Head and Neck Trachea-midline. Thyroid Gland Characteristics - normal size and consistency.  Eye Sclera/Conjunctiva - Bilateral-No scleral icterus.  Chest and Lung Exam Chest and lung exam reveals -quiet, even and easy respiratory effort with no use of accessory muscles and on auscultation, normal breath sounds, no adventitious sounds and normal vocal resonance.  Breast Nipples-No Discharge. Breast Lump-No Palpable Breast Mass.  Cardiovascular Cardiovascular examination reveals -normal heart sounds, regular rate and rhythm with no murmurs.  Lymphatic Head & Neck  General Head & Neck Lymphatics: Bilateral - Description - Normal. Axillary  General Axillary Region: Bilateral - Description - Normal. 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 We discussed all of the different options for treatment for breast cancer including surgery, chemotherapy, radiation therapy, Herceptin, and antiestrogen therapy. We discussed a sentinel lymph node biopsy as she does not appear to having lymph node involvement right now. We discussed the performance of that with injection of radioactive tracer. We discussed that there is a chance of having a positive node with a sentinel lymph node biopsy and we will await the permanent pathology to make any other first further decisions in terms of her treatment. One of these options might be to return to the operating room to perform an axillary lymph node dissection. We discussed up to a 5% risk lifetime of chronic shoulder pain as well as lymphedema associated with a sentinel lymph node biopsy. We discussed the options for treatment of the breast cancer which included lumpectomy versus a mastectomy. We discussed the performance of the lumpectomy with radioactive seed placement. We discussed a 5% chance of a positive margin requiring reexcision in the operating room. she can likely omit radiation but will decide after surgery. We discussed the mastectomy (removal of whole breast) and the postoperative care for that as well. Mastectomy can be followed by reconstruction. This is a more extensive surgery and requires more recovery. Most mastectomy patients will not need radiation therapy. We discussed that there is no difference in her survival whether she undergoes lumpectomy with radiation therapy or antiestrogen therapy versus a mastectomy. There is also no real difference between her recurrence in the breast. We discussed the risks of operation including bleeding, infection, possible reoperation. She understands her further therapy will be based on what her stages at the time of her operation.

## 2017-03-16 NOTE — Progress Notes (Signed)
Nuc med inj performed by nuc med staff. No additional sedation required. VSS. Pt tol well. Will call family to bedside and update/provide emotional support.

## 2017-03-16 NOTE — Anesthesia Procedure Notes (Signed)
Anesthesia Regional Block: Pectoralis block   Pre-Anesthetic Checklist: ,, timeout performed, Correct Patient, Correct Site, Correct Laterality, Correct Procedure, Correct Position, site marked, Risks and benefits discussed,  Surgical consent,  Pre-op evaluation,  At surgeon's request and post-op pain management  Laterality: Right  Prep: chloraprep       Needles:  Injection technique: Single-shot  Needle Type: Echogenic Stimulator Needle     Needle Length: 10cm  Needle Gauge: 21     Additional Needles:   Narrative:  Start time: 03/16/2017 12:57 PM End time: 03/16/2017 1:07 PM Injection made incrementally with aspirations every 5 mL.  Performed by: Personally

## 2017-03-16 NOTE — Discharge Instructions (Signed)
[facility] °Office Phone Number [phone] ° °POST OP INSTRUCTIONS ° °Always review your discharge instruction sheet given to you by the facility where your surgery was performed. ° °IF YOU HAVE DISABILITY OR FAMILY LEAVE FORMS, YOU MUST BRING THEM TO THE OFFICE FOR PROCESSING.  DO NOT GIVE THEM TO YOUR DOCTOR. ° °1. A prescription for pain medication may be given to you upon discharge.  Take your pain medication as prescribed, if needed.  If narcotic pain medicine is not needed, then you may take acetaminophen (Tylenol), naprosyn (Alleve) or ibuprofen (Advil) as needed. °2. Take your usually prescribed medications unless otherwise directed °3. If you need a refill on your pain medication, please contact your pharmacy.  They will contact our office to request authorization.  Prescriptions will not be filled after 5pm or on week-ends. °4. You should eat very light the first 24 hours after surgery, such as soup, crackers, pudding, etc.  Resume your normal diet the day after surgery. °5. Most patients will experience some swelling and bruising in the breast.  Ice packs and a good support bra will help.  Wear the breast binder provided or a sports bra for 72 hours day and night.  After that wear a sports bra during the day until you return to the office. Swelling and bruising can take several days to resolve.  °6. It is common to experience some constipation if taking pain medication after surgery.  Increasing fluid intake and taking a stool softener will usually help or prevent this problem from occurring.  A mild laxative (Milk of Magnesia or Miralax) should be taken according to package directions if there are no bowel movements after 48 hours. °7. Unless discharge instructions indicate otherwise, you may remove your bandages 48 hours after surgery and you may shower at that time.  You may have steri-strips (small skin tapes) in place directly over the incision.  These strips should be left on the  skin for 7-10 days and will come off on their own.  If your surgeon used skin glue on the incision, you may shower in 24 hours.  The glue will flake off over the next 2-3 weeks.  Any sutures or staples will be removed at the office during your follow-up visit. °8. ACTIVITIES:  You may resume regular daily activities (gradually increasing) beginning the next day.  Wearing a good support bra or sports bra minimizes pain and swelling.  You may have sexual intercourse when it is comfortable. °a. You may drive when you no longer are taking prescription pain medication, you can comfortably wear a seatbelt, and you can safely maneuver your car and apply brakes. °b. RETURN TO WORK:  ______________________________________________________________________________________ °9. You should see your doctor in the office for a follow-up appointment approximately two weeks after your surgery.  Your doctor’s nurse will typically make your follow-up appointment when she calls you with your pathology report.  Expect your pathology report 3-4 business days after your surgery.  You may call to check if you do not hear from us after three days. °10. OTHER INSTRUCTIONS: _______________________________________________________________________________________________ _____________________________________________________________________________________________________________________________________ °_____________________________________________________________________________________________________________________________________ °_____________________________________________________________________________________________________________________________________ ° °WHEN TO CALL DR WAKEFIELD: °1. Fever over 101.0 °2. Nausea and/or vomiting. °3. Extreme swelling or bruising. °4. Continued bleeding from incision. °5. Increased pain, redness, or drainage from the incision. ° °The clinic staff is available to answer your questions during regular  business hours.  Please don’t hesitate to call and ask to speak to one of the nurses for clinical concerns.  If   you have a medical emergency, go to the nearest emergency room or call 911.  A surgeon from [facility] is always on call at the hospital. ° °For further questions, please visit [email or web address] ° ° ° ° ° °Post Anesthesia Home Care Instructions ° °Activity: °Get plenty of rest for the remainder of the day. A responsible individual must stay with you for 24 hours following the procedure.  °For the next 24 hours, DO NOT: °-Drive a car °-Operate machinery °-Drink alcoholic beverages °-Take any medication unless instructed by your physician °-Make any legal decisions or sign important papers. ° °Meals: °Start with liquid foods such as gelatin or soup. Progress to regular foods as tolerated. Avoid greasy, spicy, heavy foods. If nausea and/or vomiting occur, drink only clear liquids until the nausea and/or vomiting subsides. Call your physician if vomiting continues. ° °Special Instructions/Symptoms: °Your throat may feel dry or sore from the anesthesia or the breathing tube placed in your throat during surgery. If this causes discomfort, gargle with warm salt water. The discomfort should disappear within 24 hours. ° °If you had a scopolamine patch placed behind your ear for the management of post- operative nausea and/or vomiting: ° °1. The medication in the patch is effective for 72 hours, after which it should be removed.  Wrap patch in a tissue and discard in the trash. Wash hands thoroughly with soap and water. °2. You may remove the patch earlier than 72 hours if you experience unpleasant side effects which may include dry mouth, dizziness or visual disturbances. °3. Avoid touching the patch. Wash your hands with soap and water after contact with the patch. °  ° °

## 2017-03-16 NOTE — Progress Notes (Signed)
Assisted Dr. Singer with right, ultrasound guided, pectoralis block. Side rails up, monitors on throughout procedure. See vital signs in flow sheet. Tolerated Procedure well. °

## 2017-03-16 NOTE — Op Note (Signed)
Preoperative diagnosis: clinical stage I right breast cancer Postoperative diagnosis: same as above Procedure: Rightbreast seed guided lumpectomy Rightdeep axillary sentinel node biopsy Surgeon: Dr Serita Grammes EBL: 30 cc Anes: general  Specimens  1. Right breast tissue marked with paint 2. Rightaxillary sentinel nodes with highest count 1221 3. Additional medial, inferior, and superior marginsmarked short superior, long lateral double deep Complications none Drains none Sponge count correct Dispo to pacu stable  Indications: This is a 45yof with clinical stage I right breast cancer. She has elected to proceed with lumpectomy and sentinel node biopsy. She had a radioactive seed placed prior to beginning.   Procedure: After informed consent was obtained the patient was taken to the operating room. She first was given technetium in standard periareolar fashion. She had a pectoral block. She was given antibiotics. Sequential compression devices were on her legs. She was then placed under general anesthesia with an LMA. Then she was prepped and draped in the standard sterile surgical fashion. Surgical timeout was then performed.  I then located the seed in the upper outer rightbreast. I infiltrated marcaine in the skin and then made a low axillary incision in an attempt to hide the scar later.  I tunneled to the seed and the mass using the lighted retractor. I then used the neoprobe to remove the seed and the surrounding tissue with attempt to get clear margins. I marked this with paint. MM confirmed removal of seed and clip. I did remove additional  margins as I thought these might be close.  I placed clips in the cavity.I then entered the axilla from the same incision.I carried this through the axillary fascia. I then located the sentinel nodes. I excised the radioactive nodes. There were no enlarged nodes. The background radioactivity was zero. I then obtained hemostasis. I  closed the axillary fascia with 2-0 vicryl and approximated the breast tissue with 2-0 vicryl.  The skin was closed with 3-0 vicryl and 4-0 monocryl. Glue and steristrips were applied. A binder was placed. She was extubated and transferred to PACU.

## 2017-03-17 ENCOUNTER — Encounter (HOSPITAL_BASED_OUTPATIENT_CLINIC_OR_DEPARTMENT_OTHER): Payer: Self-pay | Admitting: General Surgery

## 2017-03-18 ENCOUNTER — Encounter (HOSPITAL_BASED_OUTPATIENT_CLINIC_OR_DEPARTMENT_OTHER): Payer: Self-pay | Admitting: General Surgery

## 2017-04-08 NOTE — Progress Notes (Signed)
Brenda Stephens  [phone] Fax:(336) [phone]     ID: Brenda Stephens DOB: [birth year]  MR#: [id number]  DHR#:[id number]  Patient Care Team: Dyann Ruddle, MD as PCP - General (Internal Medicine) [name], [name], MD as Consulting Physician (Oncology) [name], MD as Consulting Physician (General Surgery) [name], MD as Consulting Physician (Gastroenterology) OTHER MD:  CHIEF COMPLAINT: Estrogen receptor positive breast cancer  CURRENT TREATMENT: Tamoxifen   HISTORY OF CURRENT ILLNESS: From the original intake note:  Brenda Stephens had routine bilateral screening mammography at [facility] 01/28/2017. An irregular lesion in the right breast upper outer quadrant was detected and she was recalled for right diagnostic mammography and ultrasonography 02/05/2017. This found the breast density to be category B. In the right breast upper quadrant there was an irregular mass which by ultrasound measured 0.8 cm. It was located at the 11:00 radiant posteriorly. The right axilla was sonographically benign.  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  The patient's subsequent history is as detailed below.  INTERVAL HISTORY: Brenda Stephens returns today for follow-up and treatment of her estrogen receptor positive breast cancer accompanied by her husband. Since her last visit here she underwent right lumpectomy with sentinel lymph node sampling, on 03/16/2017. The final pathology (SZA 18-04/08/1970 showed invasive ductal carcinoma measuring 1.0 cm, grade 1, with close but negative margins. All 4 sentinel lymph nodes were clear.  She is here today to discuss anti-estrogens   REVIEW OF SYSTEMS: Brenda Stephens  reports today after her recent surgery in September 2018. She endorses some discomfort, which she describes as being sore. Pt was given oxycodone for pain, but had an allergic reaction resulting in a bad rash. Since then she will take tylenol when needed for pain. She is scheduled for physical therapy on 04/22/2017. In her free time, she has been taking many different classes that she has found throughout town. She also enjoys reading and going for walks. She denies unusual headaches, visual changes, nausea, vomiting, or dizziness. There has been no unusual cough, phlegm production, or pleurisy. This been no change in bowel or bladder habits. She denies unexplained fatigue or unexplained weight loss, bleeding, rash, or fever. A detailed review of systems was otherwise entirely negative.   PAST MEDICAL HISTORY: Past Medical History:  Diagnosis Date  . Allergy    latex  . Anemia   . Anxiety   . Arthritis   . Cancer ([place]) 01/2017   right breast cancer  . Depression   . Dyslipidemia   . Eustachian tube dysfunction   . GERD (gastroesophageal reflux disease)   . Hx of cardiovascular stress test    Myoview 10/13:  apical thinning, EF 72%, no ischemia  . Hx of colonic polyps    Dr Watt Climes  . Hypertension   . Hypothyroidism   . Other abnormal glucose     PAST SURGICAL HISTORY: Past Surgical History:  Procedure Laterality Date  . ABDOMINAL HYSTERECTOMY     BSO for uterine fibroids  . ACHILLES TENDON SURGERY Right 10/07/2012   Procedure: RIGHT REPAIR RUPTURE TENDON PRIMARY OPEN/PERCUTANEOUS ;  Surgeon: Ninetta Lights, MD;  Location: [place];  Service: Orthopedics;  Laterality: Right;  . APPENDECTOMY    . BREAST LUMPECTOMY WITH RADIOACTIVE  SEED AND SENTINEL LYMPH NODE BIOPSY Right 03/16/2017   Procedure: RIGHT BREAST LUMPECTOMY WITH RADIOACTIVE SEED AND RIGHT AXILLARY SENTINEL  NODE BIOPSY ERAS PATHWAY;  Surgeon: [name], MD;  Location: [place];   Service: General;  Laterality: Right;  PECTORAL BLOCK  . CATARACT EXTRACTION, BILATERAL    . CHOLECYSTECTOMY    . COLONOSCOPY W/ POLYPECTOMY     Dr Watt Climes  . EAR CYST EXCISION Right 10/07/2012   Procedure: EXCISION BONE CYST BENIGN TUMOR [name] ;  Surgeon: Ninetta Lights, MD;  Location: [place];  Service: Orthopedics;  Laterality: Right;  . EXPLORATORY LAPAROTOMY    . SINUS SURGERY WITH INSTATRAK    . TOTAL SHOULDER REPLACEMENT     Right shoulder  . 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 TUBAL LIGATION      FAMILY HISTORY Family History  Problem Relation Age of Onset  . Hiatal hernia Mother   . Heart attack Mother 95  . Heart disease Father        CAD @ autopsy  . Cancer Father        renal cancer  . Heart attack Brother 70       fatal  . Diabetes Neg Hx   . Stroke Neg Hx   The patient's father died from kidney cancer at age 59. The patient's mother died from a heart attack at age 38. The patient had one brother, who died from a heart attack. She had no sisters. There is no history of breast or ovarian cancer in the family.  GYNECOLOGIC HISTORY:  No LMP recorded. Patient has had a hysterectomy. Menarche age 69, first live birth age 71, the patient is GX P1. She underwent total abdominal hysterectomy with bilateral salpingo-oophorectomy remotely. She used hormone replacement between 5 and 10 years.  SOCIAL HISTORY:  Seleen worked as an Designer, multimedia for a Scientist, research (medical). Her husband was Mudlogger of [place] for railroad company. He is retired but currently overseas the [employer] property as a Freight forwarder. Their daughter [name] is an attorney but currently works as a Education officer, museum in [place]. The patient has no grandchildren. She is not a church attender    ADVANCED DIRECTIVES:    HEALTH MAINTENANCE: Social History  Substance Use Topics  . Smoking status: Former Smoker    Packs/day: 0.50    Years: 6.00    Types: Cigarettes     Quit date: 06/30/1962  . Smokeless tobacco: Never Used     Comment: Quit age 30  . Alcohol use 1.8 oz/week    3 Glasses of wine per week     Comment: wine  : < 3 glasses weekly     Colonoscopy:May got  PAP: Status post hysterectomy  Bone density: Due/[facility]   Allergies  Allergen Reactions  . Latex     rash  . Aspirin     GI bleeding  . Oxycodone-Acetaminophen     nausea    Current Outpatient Prescriptions  Medication Sig Dispense Refill  . atorvastatin (LIPITOR) 20 MG tablet TAKE 1 TABLET BY MOUTH AT BEDTIME 90 tablet 1  . dicyclomine (BENTYL) 10 MG capsule Take 10 mg by mouth 4 (four) times daily -  before meals and at bedtime.    Marland Kitchen esomeprazole (NEXIUM 24HR) 20 MG capsule Take 20 mg by mouth daily at 12 noon.    . meloxicam (MOBIC) 15 MG tablet Take 15 mg by mouth daily.    Marland Kitchen  metoprolol succinate (TOPROL-XL) 50 MG 24 hr tablet Take 1 tablet (50 mg total) by mouth daily. 90 tablet 3  . oxyCODONE (OXY IR/ROXICODONE) 5 MG immediate release tablet Take 1 tablet (5 mg total) by mouth every 6 (six) hours as needed for moderate pain, severe pain or breakthrough pain. 15 tablet 0  . Polyethyl Glycol-Propyl Glycol (SYSTANE OP) Place 1 drop into both eyes 4 (four) times daily.    . Saline (AYR SALINE NASAL DROPS) 0.65 % (SOLN) SOLN Place 1 spray into the nose 3 (three) times daily.    Marland Kitchen spironolactone-hydrochlorothiazide (ALDACTAZIDE) 25-25 MG per tablet Take 1 tablet by mouth daily. 90 tablet 3  . SYNTHROID 50 MCG tablet PT NEEDS COMPLETE PHYSICAL.  TAKE 1 TABLET (50MCG TOTAL) BY MOUTH DAILY. 30 tablet 0  . venlafaxine XR (EFFEXOR-XR) 150 MG 24 hr capsule TAKE 1 CAPSULE (150 MG TOTAL) BY MOUTH DAILY. 90 capsule 2  . verapamil (CALAN-SR) 240 MG CR tablet TAKE 1 TABLET (240 MG TOTAL) BY MOUTH AT BEDTIME. 30 tablet 0  . zolpidem (AMBIEN) 10 MG tablet Take 5 mg by mouth at bedtime.     No current facility-administered medications for this visit.     OBJECTIVE: Older white woman Who appears  well  Vitals:   04/10/17 1008  BP: (!) 151/49  Pulse: (!) 56  Resp: 18  Temp: 98.1 F (36.7 C)  SpO2: 99%     Body mass index is 32.22 kg/m.   Wt Readings from Last 3 Encounters:  04/10/17 181 lb 14.4 oz (82.5 kg)  03/16/17 180 lb (81.6 kg)  03/13/17 183 lb 4.8 oz (83.1 kg)      ECOG FS:0 - Asymptomatic  Sclerae unicteric, EOMs intact Oropharynx clear and moist No cervical or supraclavicular adenopathy Lungs no rales or rhonchi Heart regular rate and rhythm Abd soft, nontender, positive bowel sounds MSK no focal spinal tenderness, no upper extremity lymphedema Neuro: nonfocal, well oriented, appropriate affect Breasts: The right breast is status post recent lumpectomy. Because medical result is excellent. The incisions are healing very nicely. The left breast is benign. Both axillae are benign.  LAB RESULTS:  CMP     Component Value Date/Time   NA 132 (L) 03/13/2017 1252   K 4.3 03/13/2017 1252   CL 98 (L) 03/10/2017 1500   CO2 26 03/13/2017 1252   GLUCOSE 107 03/13/2017 1252   BUN 14.8 03/13/2017 1252   CREATININE 0.8 03/13/2017 1252   CALCIUM 10.0 03/13/2017 1252   PROT 7.2 03/13/2017 1252   ALBUMIN 4.0 03/13/2017 1252   AST 18 03/13/2017 1252   ALT 18 03/13/2017 1252   ALKPHOS 71 03/13/2017 1252   BILITOT 0.54 03/13/2017 1252   GFRNONAA >60 03/10/2017 1500   GFRAA >60 03/10/2017 1500    No results found for: TOTALPROTELP, ALBUMINELP, A1GS, A2GS, BETS, BETA2SER, GAMS, MSPIKE, SPEI  No results found for: KPAFRELGTCHN, LAMBDASER, [facility]  Lab Results  Component Value Date   WBC 6.5 03/13/2017   NEUTROABS 4.2 03/13/2017   HGB 13.1 03/13/2017   HCT 38.3 03/13/2017   MCV 91.1 03/13/2017   PLT 236 03/13/2017      Chemistry      Component Value Date/Time   NA 132 (L) 03/13/2017 1252   K 4.3 03/13/2017 1252   CL 98 (L) 03/10/2017 1500   CO2 26 03/13/2017 1252   BUN 14.8 03/13/2017 1252   CREATININE 0.8 03/13/2017 1252      Component Value  Date/Time   CALCIUM  10.0 03/13/2017 1252   ALKPHOS 71 03/13/2017 1252   AST 18 03/13/2017 1252   ALT 18 03/13/2017 1252   BILITOT 0.54 03/13/2017 1252       No results found for: LABCA2  No components found for: [id number]  No results for input(s): INR in the last 168 hours.  No results found for: LABCA2  No results found for: YIF027  No results found for: XAJ287  No results found for: OMV672  No results found for: CA2729  No components found for: HGQUANT  No results found for: CEA1 / No results found for: CEA1   No results found for: AFPTUMOR  No results found for: CHROMOGRNA  No results found for: PSA1  No visits with results within 3 Day(s) from this visit.  Latest known visit with results is:  Admission on 03/16/2017, Discharged on 03/16/2017  Component Date Value Ref Range Status  . Sodium 03/10/2017 131* 135 - 145 mmol/L Final  . Potassium 03/10/2017 4.2  3.5 - 5.1 mmol/L Final  . Chloride 03/10/2017 98* 101 - 111 mmol/L Final  . CO2 03/10/2017 25  22 - 32 mmol/L Final  . Glucose, Bld 03/10/2017 102* 65 - 99 mg/dL Final  . BUN 03/10/2017 16  6 - 20 mg/dL Final  . Creatinine, Ser 03/10/2017 0.89  0.44 - 1.00 mg/dL Final  . Calcium 03/10/2017 9.6  8.9 - 10.3 mg/dL Final  . GFR calc non Af Amer 03/10/2017 >60  >60 mL/min Final  . GFR calc Af Amer 03/10/2017 >60  >60 mL/min Final   Comment: (NOTE) The eGFR has been calculated using the CKD EPI equation. This calculation has not been validated in all clinical situations. eGFR's persistently <60 mL/min signify possible Chronic Kidney Disease.   . Anion gap 03/10/2017 8  5 - 15 Final    (this displays the last labs from the last 3 days)  No results found for: TOTALPROTELP, ALBUMINELP, A1GS, A2GS, BETS, BETA2SER, GAMS, MSPIKE, SPEI (this displays SPEP labs)  No results found for: KPAFRELGTCHN, LAMBDASER, KAPLAMBRATIO (kappa/lambda light chains)  No results found for: HGBA, HGBA2QUANT, HGBFQUANT,  HGBSQUAN (Hemoglobinopathy evaluation)   No results found for: LDH  Lab Results  Component Value Date   IRON 59 12/18/2010   IRONPCTSAT 12.5 (L) 12/18/2010   (Iron and TIBC)  No results found for: FERRITIN  Urinalysis    Component Value Date/Time   COLORURINE YELLOW 06/04/2010 0845   APPEARANCEUR CLOUDY (A) 06/04/2010 0845   LABSPEC 1.011 06/04/2010 0845   PHURINE 7.5 06/04/2010 0845   GLUCOSEU NEGATIVE 06/04/2010 0845   HGBUR NEGATIVE 06/04/2010 0845   BILIRUBINUR NEGATIVE 06/04/2010 0845   KETONESUR NEGATIVE 06/04/2010 0845   PROTEINUR NEGATIVE 06/04/2010 0845   UROBILINOGEN 0.2 06/04/2010 0845   NITRITE NEGATIVE 06/04/2010 0845   LEUKOCYTESUR LARGE (A) 06/04/2010 0845     STUDIES: Bone density results extensively discussed with the patient  ELIGIBLE FOR AVAILABLE RESEARCH PROTOCOL: no  ASSESSMENT: 76 y.o. [place] woman status post left breast upper outer quadrant biopsy 02/09/2017 for a clinical T1b N0, stage IA invasive ductal carcinoma, grade 1, estrogen and progesterone receptor positive, HER-2 nonamplified, with an MIB-1 of 2%  (1) breast conserving surgery with sentinel lymph node sampling  09/17/2018Showed a pT1b pN0, stage IA invasive ductal carcinoma, grade 1, with negative margins (2) adjuvant radiation not anticipated  (2) adjuvant radiation not felt needed if patient takes antiestrogen for 5 years  (3) Tamoxifen started 04/10/2017  (a) status post remote hysterectomy with bilateral salpingo-oophorectomy  (  b) bone density at [facility] 03/30/2017 shows a T score of -1.0  PLAN: Brenda Stephens did very well with her surgery. We have previously discussed the fact that if she takes antiestrogen for 5 years we do not feel adjuvant radiation would make a significant contribution and can safely be bypassed.  Accordingly she is ready to consider anti-estrogens. We discussed the difference between tamoxifen and anastrozole in detail. She understands that anastrozole and  the aromatase inhibitors in general work by blocking estrogen production. Accordingly vaginal dryness, decrease in bone density, and of course hot flashes can result. The aromatase inhibitors can also negatively affect the cholesterol profile, although that is a minor effect. One out of 5 women on aromatase inhibitors we will feel "old and achy". This arthralgia/myalgia syndrome, which resembles fibromyalgia clinically, does resolve with stopping the medications. Accordingly this is not a reason to not try an aromatase inhibitor but it is a frequent reason to stop it (in other words 20% of women will not be able to tolerate these medications).  Tamoxifen on the other hand does not block estrogen production. It does not "take away a woman's estrogen". It blocks the estrogen receptor in breast cells. Like anastrozole, it can also cause hot flashes. As opposed to anastrozole, tamoxifen has many estrogen-like effects. It is technically an estrogen receptor modulator. This means that in some tissues tamoxifen works like estrogen-- for example it helps strengthen the bones. It tends to improve the cholesterol profile. It can cause thickening of the endometrial lining, and even endometrial polyps or rarely cancer of the uterus.(The risk of uterine cancer due to tamoxifen is one additional cancer per thousand women year). It can cause vaginal wetness or stickiness. It can cause blood clots through this estrogen-like effect--the risk of blood clots with tamoxifen is exactly the same as with birth control pills or hormone replacement.  Neither of these agents causes mood changes or weight gain, despite the popular belief that they can have these side effects. We have data from studies comparing either of these drugs with placebo, and in those cases the control group had the same amount of weight gain and depression as the group that took the drug.  Since she is status post hysterectomy, and also took hormone replacement  for almost 10 years with no clotting complications, I think she would be a better candidate for tamoxifen and that is what we are starting.  She is going to return to see me again in January. At that point we will set her up for repeat mammography sometime in the summer. If she tolerates tamoxifen well the plan will be to continue that for 5 years.   Of course she will call if any problems develop before the next visit.     [name], [name], MD  04/10/17 10:30 AM Medical Oncology and Hematology [facility] [address] [place], [place] [address] Tel. [phone]    Fax. [phone]  This document serves as a record of services personally performed by Chauncey Cruel, MD. It was created on her behalf by Margit Banda, a trained medical scribe. The creation of this record is based on the scribe's personal observations and the provider's statements to them. This document has been checked and approved by the attending provider.

## 2017-04-10 ENCOUNTER — Ambulatory Visit (HOSPITAL_BASED_OUTPATIENT_CLINIC_OR_DEPARTMENT_OTHER): Payer: MEDICARE | Admitting: Oncology

## 2017-04-10 ENCOUNTER — Telehealth: Payer: Self-pay | Admitting: Oncology

## 2017-04-10 VITALS — BP 151/49 | HR 56 | Temp 98.1°F | Resp 18 | Ht 63.0 in | Wt 181.9 lb

## 2017-04-10 DIAGNOSIS — C50411 Malignant neoplasm of upper-outer quadrant of right female breast: Secondary | ICD-10-CM | POA: Diagnosis present

## 2017-04-10 DIAGNOSIS — Z170 Estrogen receptor positive status [ER+]: Secondary | ICD-10-CM

## 2017-04-10 NOTE — Telephone Encounter (Signed)
Gave patient AVS and calendar of upcoming January 2019 appointment.  °

## 2017-04-13 ENCOUNTER — Other Ambulatory Visit: Payer: Self-pay

## 2017-04-13 MED ORDER — TAMOXIFEN CITRATE 20 MG PO TABS: 20.0000 mg | ORAL_TABLET | Freq: Every day | ORAL | 4 refills | Status: DC

## 2017-04-22 ENCOUNTER — Ambulatory Visit: Payer: MEDICARE | Attending: General Surgery | Admitting: Physical Therapy

## 2017-04-22 DIAGNOSIS — R293 Abnormal posture: Secondary | ICD-10-CM | POA: Insufficient documentation

## 2017-04-22 DIAGNOSIS — M6281 Muscle weakness (generalized): Secondary | ICD-10-CM | POA: Diagnosis present

## 2017-04-22 DIAGNOSIS — Z483 Aftercare following surgery for neoplasm: Secondary | ICD-10-CM | POA: Insufficient documentation

## 2017-04-22 NOTE — Patient Instructions (Signed)
First of all, check with your insurance company to see if provider is in network    [facility]   (for wigs and compression sleeves / gloves/gauntlets )  [address] [place], [place] [address] [phone]  Will file some insurances --- call for appointment   [facility] (for mastectomy prosthetics and garments) [address] [place], [place] [address] [phone] Will file some insurances --- call for appointment  [facility]  [address]  [place], [place] [address] [phone] Lower extremity garments  [facility] [address] [place],   [address] [phone]  [employer] Sales rep:  Matt Lawson:  [phone] [email or web address] Biocompression pumps   [employer]  Sales rep: Robert Rollins:  [phone] [email or web address] Entre and Flexitouch pumps    Other Resources: National Lymphedema Network:  www.lymphnet.org www.[email or web address] for patient articles and purchase a self manual lymph drainage DVD [email or web address] has informative articles.  

## 2017-04-22 NOTE — Therapy (Signed)
[place], [place], [address] Phone: [phone]   Fax:  [phone]  Physical Therapy Evaluation  Patient Details  Name: Brenda Stephens MRN: [id number] Date of Birth: [birth year] Referring Provider: Leron Croak   Encounter Date: 04/22/2017      PT End of Session - 04/22/17 1149    Visit Number 1   Number of Visits 9   Date for PT Re-Evaluation 05/29/17   PT Start Time 1100   PT Stop Time 1140   PT Time Calculation (min) 40 min   Activity Tolerance Patient tolerated treatment well   Behavior During Therapy [facility] for tasks assessed/performed      Past Medical History:  Diagnosis Date  . Allergy    latex  . Anemia   . Anxiety   . Arthritis   . Cancer ([place]) 01/2017   right breast cancer  . Depression   . Dyslipidemia   . Eustachian tube dysfunction   . GERD (gastroesophageal reflux disease)   . Hx of cardiovascular stress test    Myoview 10/13:  apical thinning, EF 72%, no ischemia  . Hx of colonic polyps    Dr Watt Climes  . Hypertension   . Hypothyroidism   . Other abnormal glucose     Past Surgical History:  Procedure Laterality Date  . ABDOMINAL HYSTERECTOMY     BSO for uterine fibroids  . ACHILLES TENDON SURGERY Right 10/07/2012   Procedure: RIGHT REPAIR RUPTURE TENDON PRIMARY OPEN/PERCUTANEOUS ;  Surgeon: Ninetta Lights, MD;  Location: [place];  Service: Orthopedics;  Laterality: Right;  . APPENDECTOMY    . BREAST LUMPECTOMY WITH RADIOACTIVE SEED AND SENTINEL LYMPH NODE BIOPSY Right 03/16/2017   Procedure: RIGHT BREAST LUMPECTOMY WITH RADIOACTIVE SEED AND RIGHT AXILLARY SENTINEL  NODE BIOPSY ERAS PATHWAY;  Surgeon: [name], MD;  Location: [facility];  Service: General;  Laterality: Right;  PECTORAL BLOCK  . CATARACT EXTRACTION, BILATERAL    . CHOLECYSTECTOMY    . COLONOSCOPY W/ POLYPECTOMY     Dr Watt Climes  . EAR CYST EXCISION Right 10/07/2012    Procedure: EXCISION BONE CYST BENIGN TUMOR [name] ;  Surgeon: Ninetta Lights, MD;  Location: [place];  Service: Orthopedics;  Laterality: Right;  . EXPLORATORY LAPAROTOMY    . SINUS SURGERY WITH INSTATRAK    . TOTAL SHOULDER REPLACEMENT     Right shoulder  . 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 TUBAL LIGATION      There were no vitals filed for this visit.       Subjective Assessment - 04/22/17 1110    Subjective pt reports she is fatigue, sometimes pain up the middle of her right breast. pain with lifting arm up.  Currenly wears a sports bra    Pertinent History right lumpectomy on 9/17/ 2018 with 4 deep axillary lymph nodes removed.  No chemo or radiation.  Past Hx:  bilateral shoulder replacements > 10 years with limited ROM, arthritis in neck and hands    Patient Stated Goals wants to gain as much strength as she can    Currently in Pain? No/denies            [facility] PT Assessment - 04/22/17 0001      Assessment   Medical Diagnosis  right lumpectomy    Referring Provider Sr. [place]    Onset Date/Surgical Date 03/16/17   Hand Dominance Right   Prior  Therapy not this year      Precautions   Precautions Other (comment)   Precaution Comments no more than 20 pounds lifting from shoulder replacements      Restrictions   Weight Bearing Restrictions No     Balance Screen   Has the patient fallen in the past 6 months No   Has the patient had a decrease in activity level because of a fear of falling?  No   Is the patient reluctant to leave their home because of a fear of falling?  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 She wants to take [place]   Overall Cognitive Status Within Functional Limits for tasks assessed     Observation/Other Assessments   Observations pt with healing surgical incisions    Quick DASH  52.27     Posture/Postural Control   Posture/Postural Control Postural limitations   Postural Limitations Rounded Shoulders;Forward head;Decreased thoracic kyphosis     ROM / Strength   AROM / PROM / Strength AROM;Strength     AROM   Overall AROM Comments limited previously from arthritis    Right Shoulder Flexion 125 Degrees   Right Shoulder ABduction 115 Degrees   Right Shoulder Internal Rotation 20 Degrees   Right Shoulder External Rotation 68 Degrees   Left Shoulder Flexion 80 Degrees   Left Shoulder ABduction 60 Degrees   Left Shoulder Internal Rotation 70 Degrees   Left Shoulder External Rotation 20 Degrees  painful      Strength   Overall Strength Deficits   Overall Strength Comments generalized muscle atrophy    Right Hand Grip (lbs) 15/12/12  pain all the way up to the neck    Left Hand Grip (lbs) 15/12/20           LYMPHEDEMA/ONCOLOGY QUESTIONNAIRE - 04/22/17 1122      Right Upper Extremity Lymphedema   10 cm Proximal to Olecranon Process 29.5 cm   Olecranon Process 27 cm   15 cm Proximal to Ulnar Styloid Process 26 cm   Just Proximal to Ulnar Styloid Process 17 cm   Across Hand at [employer] 18.5 cm   At [place] of 2nd Digit 6 cm     Left Upper Extremity Lymphedema   10 cm Proximal to Olecranon Process 29 cm   Olecranon Process 27 cm   15 cm Proximal to Ulnar Styloid Process 26 cm   Just Proximal to Ulnar Styloid Process 17 cm   Across Hand at [employer] 19 cm   At [place] of 2nd Digit 6 cm           Quick Dash - 04/22/17 0001    Open a tight or new jar Severe difficulty   Do heavy household chores (wash walls, wash floors) Severe difficulty   Carry a shopping bag or briefcase Moderate difficulty    Wash your back Moderate difficulty   Use a knife to cut food No difficulty   Recreational activities in which you take some force or impact through your arm, shoulder, or hand (golf, hammering, tennis) Moderate difficulty   During the  past week, to what extent has your arm, shoulder or hand problem interfered with your normal social activities with family, friends, neighbors, or groups? Modererately   During the past week, to what extent has your arm, shoulder or hand problem limited your work or other regular daily activities Modererately   Arm, shoulder, or hand pain. Severe   Tingling (pins and needles) in your arm, shoulder, or hand Moderate   Difficulty Sleeping Moderate difficulty   DASH Score 52.27 %      Objective measurements completed on examination: See above findings.                          Long Term Clinic Goals - 04/22/17 1200      CC Long Term Goal  #1   Title Pt will  verbalize lymphedema risk reduction.   Time 4   Period Weeks   Status New     CC Long Term Goal  #2   Title Pt will be independent in home program for increased strength    Time 4   Period Weeks   Status New     CC Long Term Goal  #3   Title pt will reduce Quick DASH score to < 39 indicating an improvment in UE function    Baseline 52.27   Time 4   Period Weeks   Status New             Plan - 04/22/17 1150    Clinical Impression Statement Pt is 5 weeks post lumpectomy  and will not need further treatment for breast cancer. She has decreased shoulder ROM and strength and had 4 deep axillary nodes removed.  She had shoulder impairment previously from arthritis and bilateral shoulder replacement but wants to get as strong as she can    History and Personal Factors relevant to plan of care: arthritis in multiple joints with decreased range of motion, especially in left shoulder from previous shoulder replacements.    Clinical Presentation Stable   Clinical Presentation  due to: recovering well post op, no further treatment    Clinical Decision Making Low   Rehab Potential Good   Clinical Impairments Affecting Rehab Potential previous bilateral total shoulder replacement, arthritin in neck    PT Frequency 2x / week   PT Duration 4 weeks   PT Treatment/Interventions ADLs/Self Care Home Management;Patient/family education;Orthotic Fit/Training;Manual techniques;Passive range of motion;Neuromuscular re-education;Therapeutic exercise;Therapeutic activities   PT Next Visit Plan Meeks decompression, dowel rod exercise, progress to isometrics and supine scapulare series with progression.  ROM within tolerance with precaution for total shoulders    Consulted and Agree with Plan of Care Patient      Patient will benefit from skilled therapeutic intervention in order to improve the following deficits and impairments:  Obesity, Decreased knowledge of precautions, Decreased strength, Impaired UE functional use, Pain, Decreased range of motion, Postural dysfunction  Visit Diagnosis: Aftercare following surgery for neoplasm - Plan: PT plan of care cert/re-cert  Abnormal posture - Plan: PT plan of care cert/re-cert  Muscle weakness (generalized) - Plan: PT plan of care cert/re-cert      G-Codes - 32/35/57 1200/08/08    Functional Assessment Tool Used (Outpatient Only) Quick DASH    Functional Limitation Carrying, moving and handling objects   Carrying, Moving and Handling Objects Current Status (D2202) At least 40 percent but less than 60 percent impaired, limited or restricted   Carrying, Moving and Handling Objects Goal Status (  G8985) At least 20 percent but less than 40 percent impaired, limited or restricted       Problem List Patient Active Problem List   Diagnosis Date Noted  . Malignant neoplasm of upper-outer quadrant of right breast in female, estrogen receptor positive ([place]) 03/13/2017  . Acute frontal sinusitis 05/13/2013  . Precordial pain 05/10/2013  .  Fasting hyperglycemia 06/13/2011  . HYPOTHYROIDISM 05/06/2010  . DYSLIPIDEMIA 05/06/2010  . ARTHRITIS, SHOULDER 05/06/2010  . FATIGUE, CHRONIC 05/06/2010  . Seasonal and perennial allergic rhinitis 03/14/2010  . ANEMIA-IRON DEFICIENCY 10/29/2009  . HYPERTENSION 10/29/2009  . GERD 10/29/2009  . COLONIC POLYPS, HX OF 10/29/2009  . UMBILICAL HERNIA, HX OF 69/45/0388   Brenda Stephens PT  Brenda Stephens 04/22/2017, 12:04 PM  [place], [place], [address] Phone: [phone]   Fax:  [phone]  Name: Brenda Stephens MRN: [id number] Date of Birth: 1941-06-18

## 2017-05-04 ENCOUNTER — Encounter: Payer: Self-pay | Admitting: Physical Therapy

## 2017-05-04 ENCOUNTER — Ambulatory Visit: Payer: MEDICARE | Attending: General Surgery | Admitting: Physical Therapy

## 2017-05-04 DIAGNOSIS — R293 Abnormal posture: Secondary | ICD-10-CM | POA: Insufficient documentation

## 2017-05-04 DIAGNOSIS — M6281 Muscle weakness (generalized): Secondary | ICD-10-CM | POA: Diagnosis present

## 2017-05-04 DIAGNOSIS — Z483 Aftercare following surgery for neoplasm: Secondary | ICD-10-CM | POA: Insufficient documentation

## 2017-05-04 NOTE — Therapy (Signed)
[place], [place], [address] Phone: [phone]   Fax:  [phone]  Physical Therapy Treatment  Patient Details  Name: Brenda Stephens MRN: [id number] Date of Birth: 12/19/1940 Referring Provider: Leron Croak    Encounter Date: 05/04/2017  PT End of Session - 05/04/17 1214    Visit Number  2    Number of Visits  9    Date for PT Re-Evaluation  05/29/17    PT Start Time  1018    PT Stop Time  1100    PT Time Calculation (min)  42 min    Activity Tolerance  Patient tolerated treatment well    Behavior During Therapy  [facility] for tasks assessed/performed       Past Medical History:  Diagnosis Date  . Allergy    latex  . Anemia   . Anxiety   . Arthritis   . Cancer ([place]) 01/2017   right breast cancer  . Depression   . Dyslipidemia   . Eustachian tube dysfunction   . GERD (gastroesophageal reflux disease)   . Hx of cardiovascular stress test    Myoview 10/13:  apical thinning, EF 72%, no ischemia  . Hx of colonic polyps    Dr Watt Climes  . Hypertension   . Hypothyroidism   . Other abnormal glucose     Past Surgical History:  Procedure Laterality Date  . ABDOMINAL HYSTERECTOMY     BSO for uterine fibroids  . APPENDECTOMY    . CATARACT EXTRACTION, BILATERAL    . CHOLECYSTECTOMY    . COLONOSCOPY W/ POLYPECTOMY     Dr Watt Climes  . EXPLORATORY LAPAROTOMY    . SINUS SURGERY WITH INSTATRAK    . TOTAL SHOULDER REPLACEMENT     Right shoulder  . 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 TUBAL LIGATION      There were no vitals filed for this visit.  Subjective Assessment - 05/04/17 1020    Subjective  My shoulder is not too bad today. I am still having pain that goes through the middle of my right breast.     Pertinent History  right lumpectomy on 9/17/ 2018 with 4 deep axillary lymph nodes removed.  No chemo or radiation.  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  No/denies                      [facility] Adult PT Treatment/Exercise - 05/04/17 0001      Shoulder Exercises: Supine   Flexion  AAROM;Both;10 reps with dowel with 5 sec holds   with dowel with 5 sec holds   ABduction  AAROM;Both with dowel with 5 sec holds   with dowel with 5 sec holds   Other Supine Exercises  Instructed pt in Meeks decompression exercises      Manual Therapy   Manual Therapy  Passive ROM    Passive ROM  to bilateral shoulders in direction of flexion, abduction, ER to patients tolerance                     Long Term Clinic Goals - 04/22/17 1200      CC Long Term Goal  #1   Title  Pt will  verbalize lymphedema risk reduction.    Time  4    Period  Weeks    Status  New      CC Long Term Goal  #2   Title  Pt will be independent in home program for increased strength     Time  4    Period  Weeks    Status  New      CC Long Term Goal  #3   Title  pt will reduce Quick DASH score to < 39 indicating an improvment in UE function     Baseline  52.27    Time  4    Period  Weeks    Status  New         Plan - 05/04/17 1216    Clinical Impression Statement  Began instructed pt in Meeks decompression exercies to help reduce back pain. Also instructed pt in supine dowel exercises for improved ROM. Began PROM to bilateral shoulders to pt's tolerance.     Rehab Potential  Good    Clinical Impairments Affecting Rehab Potential  previous bilateral total shoulder replacement, arthritin in neck     PT Treatment/Interventions  ADLs/Self Care Home Management;Patient/family education;Orthotic Fit/Training;Manual techniques;Passive range of motion;Neuromuscular re-education;Therapeutic exercise;Therapeutic activities    PT Next Visit Plan  progress to isometrics and supine scapular series (may need to start with no resistance)  with progression.  ROM within tolerance with precaution for total shoulders     Consulted and Agree with Plan of Care  Patient       Patient will benefit from skilled therapeutic intervention in order to improve the following deficits and impairments:  Obesity, Decreased knowledge of precautions, Decreased strength, Impaired UE functional use, Pain, Decreased range of motion, Postural dysfunction  Visit Diagnosis: Aftercare following surgery for neoplasm  Abnormal posture     Problem List Patient Active Problem List   Diagnosis Date Noted  . Malignant neoplasm of upper-outer quadrant of right breast in female, estrogen receptor positive ([place]) 03/13/2017  . Acute frontal sinusitis 05/13/2013  . Precordial pain 05/10/2013  . Fasting hyperglycemia 06/13/2011  . HYPOTHYROIDISM 05/06/2010  . DYSLIPIDEMIA 05/06/2010  . ARTHRITIS, SHOULDER 05/06/2010  . FATIGUE, CHRONIC 05/06/2010  . Seasonal and perennial allergic rhinitis 03/14/2010  . ANEMIA-IRON DEFICIENCY 10/29/2009  . HYPERTENSION 10/29/2009  . GERD 10/29/2009  . COLONIC POLYPS, HX OF 10/29/2009  . UMBILICAL HERNIA, HX OF 16/03/9603    Allyson Sabal [facility] 05/04/2017, 12:18 PM  [place], [place], [address] Phone: [phone]   Fax:  [phone]  Name: CRYTAL PENSINGER MRN: [id number] Date of Birth: 08-Nov-1940  Manus Gunning, PT 05/04/17 12:19 PM

## 2017-05-04 NOTE — Patient Instructions (Addendum)
1. Decompression Exercise     Cancer Rehab [phone]    Lie on back on firm surface, knees bent, feet flat, arms turned up, out to sides, backs of hands down. Time _5-15__ minutes. Surface: floor   2. Shoulder Press    Start in Decompression Exercise position. Press shoulders downward towards supporting surface. Hold __2-3__ seconds while counting out loud. Repeat _3-5___ times. Do _1-2___ times per day.   3. Head Press    Bring cervical spine (neck) into neutral position (by either tucking the chin towards the chest or tilting the chin upward). Feel weight on back of head. Press head downward into supporting surface.    Hold _2-3__ seconds. Repeat _3-5__ times. Do _1-2__ times per day.   4. Leg Lengthener    Straighten one leg. Pull toes AND forefoot toward knee, extend heel. Lengthen leg by pulling pelvis away from ribs. Hold _2-3__ seconds. Relax. Repeat __4-6__ times. Do other leg.  Surface: floor   5. Leg Press    Straighten one leg down to floor keeping leg aligned with hip. Pull toes AND forefoot toward knee; extend heel.  Press entire leg downward (as if pressing leg into sandy beach). DO NOT BEND KNEE. Hold _2-3__ seconds. Do __4-6__ times. Repeat with other leg.    Shoulder: Flexion (Supine)    With hands shoulder width apart, slowly lower dowel to floor behind head. Do not let elbows bend. Keep back flat. Hold __5-10_ seconds. Repeat _10___ times. Do _1___ sessions per day. CAUTION: Stretch slowly and gently.  Copyright  VHI. All rights reserved.  Shoulder: Abduction (Supine)    With right arm flat on floor, hold dowel in palm. Slowly move arm up to side of head by pushing with opposite arm. Do not let elbow bend. Repeat on opposite side. Hold _5-10___ seconds. Repeat _10___ times. Do _1___ sessions per day. CAUTION: Stretch slowly and gently.  Copyright  VHI. All rights reserved.

## 2017-05-06 ENCOUNTER — Ambulatory Visit: Payer: MEDICARE

## 2017-05-12 ENCOUNTER — Other Ambulatory Visit: Payer: Self-pay

## 2017-05-12 ENCOUNTER — Ambulatory Visit: Payer: MEDICARE | Admitting: Physical Therapy

## 2017-05-12 ENCOUNTER — Encounter: Payer: Self-pay | Admitting: Physical Therapy

## 2017-05-12 DIAGNOSIS — M6281 Muscle weakness (generalized): Secondary | ICD-10-CM

## 2017-05-12 DIAGNOSIS — R293 Abnormal posture: Secondary | ICD-10-CM

## 2017-05-12 DIAGNOSIS — Z483 Aftercare following surgery for neoplasm: Secondary | ICD-10-CM

## 2017-05-12 NOTE — Therapy (Signed)
[place], [place], [address] Phone: [phone]   Fax:  [phone]  Physical Therapy Treatment  Patient Details  Name: Brenda Stephens MRN: [id number] Date of Birth: 07-Nov-1940 Referring Provider: Leron Croak    Encounter Date: 05/12/2017  PT End of Session - 05/12/17 1203    Visit Number  3    Number of Visits  9    Date for PT Re-Evaluation  05/29/17    PT Start Time  8325    PT Stop Time  1055    PT Time Calculation (min)  40 min    Activity Tolerance  Patient tolerated treatment well    Behavior During Therapy  [facility] for tasks assessed/performed       Past Medical History:  Diagnosis Date  . Allergy    latex  . Anemia   . Anxiety   . Arthritis   . Cancer ([place]) 01/2017   right breast cancer  . Depression   . Dyslipidemia   . Eustachian tube dysfunction   . GERD (gastroesophageal reflux disease)   . Hx of cardiovascular stress test    Myoview 10/13:  apical thinning, EF 72%, no ischemia  . Hx of colonic polyps    Dr Watt Climes  . Hypertension   . Hypothyroidism   . Other abnormal glucose     Past Surgical History:  Procedure Laterality Date  . ABDOMINAL HYSTERECTOMY     BSO for uterine fibroids  . APPENDECTOMY    . CATARACT EXTRACTION, BILATERAL    . CHOLECYSTECTOMY    . COLONOSCOPY W/ POLYPECTOMY     Dr Watt Climes  . EXPLORATORY LAPAROTOMY    . SINUS SURGERY WITH INSTATRAK    . TOTAL SHOULDER REPLACEMENT     Right shoulder  . 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 TUBAL LIGATION      There were no vitals filed for this visit.  Subjective Assessment - 05/12/17 1107    Subjective  No pain at this time.  She feels that she is able to use her arms a little better     Pertinent History  right lumpectomy on 9/17/ 2018 with 4 deep axillary lymph nodes removed.  No chemo or radiation.  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  Pt taking a flight to [place] in December  Gave her referral sheet for [facility] for velcro wrap due to history of total shoulder. Pt will decide if she will get it now or try this flight without it.       Exercises   Exercises  Shoulder      Shoulder Exercises: Supine   Protraction  AROM;Right;Left;10 reps    External Rotation  Strengthening;Both;5 reps;Theraband    Theraband Level (Shoulder External Rotation)  Level 1 (Yellow)    Flexion  AROM;Right;Left;5 reps    Other Supine Exercises  small controlled circles in both directions       Shoulder Exercises: Isometric Strengthening   Flexion  5X5"    Extension  5X5"    ABduction  5X5"             PT Education - 05/12/17 1203    Education provided  Yes    Education Details  lymphedema risk reduciton and isometric exercises     Person(s) Educated  Patient    Methods  Explanation;Demonstration    Comprehension  Verbalized understanding             [place] [facility] Goals - 05/12/17 1037      CC Long Term Goal  #1   Title  Pt will  verbalize lymphedema risk reduction.    Time  4    Status  Achieved      CC Long Term Goal  #2   Title  Pt will be independent in home program for increased strength     Time  4    Period  Weeks    Status  On-going      CC Long Term Goal  #3   Title  pt will reduce Quick DASH score to < 39 indicating an improvment in UE function     Baseline  52.27    Time  4     Status  New         Plan - 05/12/17 1205    Clinical Impression Statement  Pt appears to be progressing especailly with RUE function.  She has more limitation in L shoulder as she did previous to surgery.  Upgraded exercise for isometric strengthening for shoulders that should protect the total joint replacement.  Pt has attended ABC class and acknowledges she is aware of risk reduction practices, so that goal is met.  Reommended velcro wrap to right arm for propylaxis if she wants to use it for flying as donning a compression sleeve may be problematic for her shoulder.     Clinical Impairments Affecting Rehab Potential  previous bilateral total shoulder replacement, arthritis in neck     PT Next Visit Plan  check if she has problems with  isometrics and prgress to supine scapular series (may need to start with no resistance) with progression.  ROM within tolerance with precaution for total shoulders        Patient will benefit from skilled therapeutic intervention in order to improve the following deficits and impairments:  Obesity, Decreased knowledge of precautions, Decreased strength, Impaired UE functional use, Pain, Decreased range of motion, Postural dysfunction  Visit Diagnosis: Aftercare following surgery for neoplasm  Abnormal posture  Muscle weakness (generalized)     Problem List Patient Active Problem List   Diagnosis Date Noted  . Malignant neoplasm of upper-outer quadrant of right breast in female, estrogen receptor positive ([place]) 03/13/2017  . Acute frontal sinusitis 05/13/2013  . Precordial pain 05/10/2013  . Fasting hyperglycemia 06/13/2011  . HYPOTHYROIDISM 05/06/2010  . DYSLIPIDEMIA 05/06/2010  . ARTHRITIS, SHOULDER 05/06/2010  . FATIGUE, CHRONIC 05/06/2010  . Seasonal and perennial allergic rhinitis 03/14/2010  . ANEMIA-IRON DEFICIENCY 10/29/2009  . HYPERTENSION 10/29/2009  . GERD 10/29/2009  . COLONIC POLYPS, HX OF 10/29/2009  . UMBILICAL HERNIA, HX OF  10/29/2009   Donato Heinz.  Owens Shark PT  Norwood Levo 05/12/2017, 12:10 PM  [place], [place], [address] Phone: [phone]   Fax:  [phone]  Name: Brenda Stephens MRN: [id number] Date of Birth: 07-11-1940

## 2017-05-12 NOTE — Patient Instructions (Addendum)
Flexion (Isometric)      Cancer Rehab [phone]    Press right fist against wall. Hold __5__ seconds. Repeat _5-10___ times. Do __1-2__ sessions per day.  SHOULDER: Abduction (Isometric)    Use wall as resistance. Press arm against pillow. Hold _5__ seconds. _5-10__ times. Do _1-2__ sessions per day.    External Rotation (Isometric)    Place back of left fist against door frame, with elbow bent. Press fist against door frame. Hold __5__ seconds. Repeat _5-10___ times. Do _1-2___ sessions per day.  Extension (Isometric)    Place left bent elbow and back of arm against wall. Press elbow against wall. Hold __5__ seconds. Repeat _5-10___ times. Do _1-2___ sessions per day.   First of all, check with your insurance company to see if provider is in [place]   (for wigs and compression sleeves / gloves/gauntlets )  [place], [place] [address] [phone]  Will file some insurances --- call for appointment   Second to [facility] (for mastectomy prosthetics and garments) [address], [place] [address] [phone] Will file some insurances --- call for appointment  [facility]  [address]  [place], [place] [address] [phone] Lower extremity garments  [facility] [address] [place], [place]  [address] [place] Sales rep:  [place]:  [phone] [email or web address] Biocompression pumps   [employer]  Sales rep: Donneta Romberg:  [phone] [email or web address] Lyndal Pulley and Flexitouch pumps    Other Resources: National Lymphedema Network:  www.lymphnet.org [email or web address] for patient articles and purchase a self manual lymph drainage DVD [email or web address] has informative articles.

## 2017-05-14 ENCOUNTER — Ambulatory Visit: Payer: MEDICARE | Admitting: Physical Therapy

## 2017-05-14 ENCOUNTER — Encounter: Payer: Self-pay | Admitting: Physical Therapy

## 2017-05-14 DIAGNOSIS — Z483 Aftercare following surgery for neoplasm: Secondary | ICD-10-CM | POA: Diagnosis not present

## 2017-05-14 DIAGNOSIS — M6281 Muscle weakness (generalized): Secondary | ICD-10-CM

## 2017-05-14 DIAGNOSIS — R293 Abnormal posture: Secondary | ICD-10-CM

## 2017-05-14 NOTE — Patient Instructions (Signed)

## 2017-05-14 NOTE — Therapy (Signed)
[place], [place], [address] Phone: [phone]   Fax:  [phone]  Physical Therapy Treatment  Patient Details  Name: Brenda Stephens MRN: [id number] Date of Birth: 09/10/1940 Referring Provider: Leron Croak    Encounter Date: 05/14/2017  PT End of Session - 05/14/17 1025    Visit Number  4    Number of Visits  9    Date for PT Re-Evaluation  05/29/17    PT Start Time  8144    PT Stop Time  1100    PT Time Calculation (min)  45 min    Activity Tolerance  Patient limited by pain    Behavior During Therapy  [facility] for tasks assessed/performed       Past Medical History:  Diagnosis Date  . Allergy    latex  . Anemia   . Anxiety   . Arthritis   . Cancer ([place]) 01/2017   right breast cancer  . Depression   . Dyslipidemia   . Eustachian tube dysfunction   . GERD (gastroesophageal reflux disease)   . Hx of cardiovascular stress test    Myoview 10/13:  apical thinning, EF 72%, no ischemia  . Hx of colonic polyps    Dr Watt Climes  . Hypertension   . Hypothyroidism   . Other abnormal glucose     Past Surgical History:  Procedure Laterality Date  . ABDOMINAL HYSTERECTOMY     BSO for uterine fibroids  . ACHILLES TENDON SURGERY Right 10/07/2012   Procedure: RIGHT REPAIR RUPTURE TENDON PRIMARY OPEN/PERCUTANEOUS ;  Surgeon: Ninetta Lights, MD;  Location: [facility];  Service: Orthopedics;  Laterality: Right;  . APPENDECTOMY    . BREAST LUMPECTOMY WITH RADIOACTIVE SEED AND SENTINEL LYMPH NODE BIOPSY Right 03/16/2017   Procedure: RIGHT BREAST LUMPECTOMY WITH RADIOACTIVE SEED AND RIGHT AXILLARY SENTINEL  NODE BIOPSY ERAS PATHWAY;  Surgeon: [name], MD;  Location: [facility];  Service: General;  Laterality: Right;  PECTORAL BLOCK  . CATARACT EXTRACTION, BILATERAL    . CHOLECYSTECTOMY    . COLONOSCOPY W/ POLYPECTOMY     Dr Watt Climes  . EAR CYST EXCISION Right  10/07/2012   Procedure: EXCISION BONE CYST BENIGN TUMOR [name] ;  Surgeon: Ninetta Lights, MD;  Location: [facility];  Service: Orthopedics;  Laterality: Right;  . EXPLORATORY LAPAROTOMY    . SINUS SURGERY WITH INSTATRAK    . TOTAL SHOULDER REPLACEMENT     Right shoulder  . 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 TUBAL LIGATION      There were no vitals filed for this visit.  Subjective Assessment - 05/14/17 1022    Subjective  Pt is having some pain in her shoulders and neck from the rainy weather we are recently.  She is not feeling any fullness in her breast and arm . She is still not able to keep her arm up for long as she tries to clean her her She was not able to do the isometrics     Pertinent History  right lumpectomy on 9/17/ 2018 with 4 deep axillary lymph nodes removed.  No chemo or radiation.  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 also did elbow extension       Lumbar Exercises: Supine   Ab Set  5 reps    Clam  5 reps    Bent Knee Raise  10 reps    Bent Knee Raise Limitations  only raised leg about an inch.  Maintained partial ab set       Shoulder Exercises: Supine   Protraction  AROM;Right;Left;10 reps    Flexion  AROM;5 reps    Other Supine Exercises  Reviewed Meeks decompression and instructed in supine scapular series with yellow theraband for 5 reps. Pt performed slowly and needed frequent cues.  Limiited by some pain in left shoulder and neck     Other Supine Exercises  small controlled circles 5 reps in each direction       Manual Therapy   Manual therapy comments  manual isometrics     Passive ROM  to bilateral  shoulders in direction of flexion, abduction, ER to patients tolerance             PT Education - 05/14/17 1242    Education provided  Yes    Education Details  Reviewed Meeks decompression and taught supine scapular seried     Person(s) Educated  Patient    Methods  Explanation;Demonstration    Comprehension  Need further instruction             [facility] Term Clinic Goals - 05/12/17 1037      CC Long Term Goal  #1   Title  Pt will  verbalize lymphedema risk reduction.    Time  4    Status  Achieved      CC Long Term Goal  #2   Title  Pt will be independent in home program for increased strength     Time  4    Period  Weeks    Status  On-going      CC Long Term Goal  #3   Title  pt will reduce Quick DASH score to < 39 indicating an improvment in UE function     Baseline  52.27    Time  4    Status  New         Plan - 05/14/17 1243    Clinical Impression Statement  Pt limited by pain today that she thinks is from the weather.  She continues to need muscular strengthening and review of home exercise     Rehab Potential  Good    Clinical Impairments Affecting Rehab Potential  previous bilateral total shoulder replacement, arthritis in neck     PT Frequency  2x / week    PT Duration  4 weeks    PT Next Visit Plan  continue review of   isometrics and supine scapular series with progression.  ROM within tolerance with precaution for total shoulders   add basic core exercsies     Consulted and Agree with Plan of Care  Patient       Patient will benefit from skilled therapeutic intervention in order to improve the following deficits and impairments:  Obesity, Decreased knowledge of precautions, Decreased strength, Impaired UE functional use, Pain, Decreased range of motion, Postural dysfunction  Visit Diagnosis: Aftercare following surgery for neoplasm  Abnormal posture  Muscle weakness (generalized)     Problem List Patient Active Problem List   Diagnosis  Date Noted  . Malignant neoplasm of upper-outer quadrant of right breast in female, estrogen receptor positive ([place]) 03/13/2017  . Acute frontal sinusitis 05/13/2013  . Precordial pain 05/10/2013  . Fasting hyperglycemia 06/13/2011  . HYPOTHYROIDISM 05/06/2010  . DYSLIPIDEMIA 05/06/2010  . ARTHRITIS, SHOULDER 05/06/2010  . FATIGUE, CHRONIC 05/06/2010  . Seasonal and perennial allergic rhinitis 03/14/2010  . ANEMIA-IRON DEFICIENCY 10/29/2009  . HYPERTENSION 10/29/2009  . GERD 10/29/2009  . COLONIC POLYPS, HX OF 10/29/2009  . UMBILICAL HERNIA, HX OF 69/45/0388   Donato Heinz. Owens Shark PT  Norwood Levo 05/14/2017, 12:45 PM  [place], [place], [address] Phone: [phone]   Fax:  [phone]  Name: Brenda Stephens MRN: [id number] Date of Birth: Mar 13, 1941

## 2017-05-18 ENCOUNTER — Ambulatory Visit: Payer: MEDICARE

## 2017-05-18 DIAGNOSIS — Z483 Aftercare following surgery for neoplasm: Secondary | ICD-10-CM | POA: Diagnosis not present

## 2017-05-18 DIAGNOSIS — M6281 Muscle weakness (generalized): Secondary | ICD-10-CM

## 2017-05-18 DIAGNOSIS — R293 Abnormal posture: Secondary | ICD-10-CM

## 2017-05-18 NOTE — Therapy (Signed)
[place], [place], [address] Phone: [phone]   Fax:  [phone]  Physical Therapy Treatment  Patient Details  Name: Brenda Stephens MRN: [id number] Date of Birth: 04-Dec-1940 Referring Provider: Leron Croak    Encounter Date: 05/18/2017  PT End of Session - 05/18/17 1018    Visit Number  5    Number of Visits  9    Date for PT Re-Evaluation  05/29/17    PT Start Time  0934    PT Stop Time  1016    PT Time Calculation (min)  42 min    Activity Tolerance  Patient tolerated treatment well    Behavior During Therapy  [facility] for tasks assessed/performed       Past Medical History:  Diagnosis Date  . Allergy    latex  . Anemia   . Anxiety   . Arthritis   . Cancer ([place]) 01/2017   right breast cancer  . Depression   . Dyslipidemia   . Eustachian tube dysfunction   . GERD (gastroesophageal reflux disease)   . Hx of cardiovascular stress test    Myoview 10/13:  apical thinning, EF 72%, no ischemia  . Hx of colonic polyps    Dr Watt Climes  . Hypertension   . Hypothyroidism   . Other abnormal glucose     Past Surgical History:  Procedure Laterality Date  . ABDOMINAL HYSTERECTOMY     BSO for uterine fibroids  . APPENDECTOMY    . CATARACT EXTRACTION, BILATERAL    . CHOLECYSTECTOMY    . COLONOSCOPY W/ POLYPECTOMY     Dr Watt Climes  . EXCISION BONE CYST BENIGN TUMOR TALUS /CALCANEUS Right 10/07/2012   Performed by Ninetta Lights, MD at [facility]  . EXPLORATORY LAPAROTOMY    . RIGHT BREAST LUMPECTOMY WITH RADIOACTIVE SEED AND RIGHT AXILLARY SENTINEL  NODE BIOPSY ERAS PATHWAY Right 03/16/2017   Performed by [name], MD at [facility]  . RIGHT REPAIR RUPTURE TENDON PRIMARY OPEN/PERCUTANEOUS Right 10/07/2012   Performed by Ninetta Lights, MD at [facility]  . SINUS SURGERY WITH INSTATRAK    . TOTAL SHOULDER REPLACEMENT     Right shoulder  .  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 TUBAL LIGATION      There were no vitals filed for this visit.  Subjective Assessment - 05/18/17 0936    Subjective  I've been doing the isometrics since I was here last and they are going okay, not flaring me up. I'm not hurting quite as bad as I was last time, the weather (rainy) always makes it worse.     Pertinent History  right lumpectomy on 9/17/ 2018 with 4 deep axillary lymph nodes removed.  No chemo or radiation.  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  Yes    Pain Score  2     Pain Location  Neck    Pain Orientation  Right    Pain Descriptors / Indicators  Sore    Pain Onset  More than a month ago    Pain Frequency  Constant    Aggravating Factors   bad weather, overuse or not enough use of Rt arm    Pain  Relieving Factors  nothing really                      [facility] Adult PT Treatment/Exercise - 05/18/17 0001      Shoulder Exercises: Supine   Horizontal ABduction  Strengthening;Both;5 reps;Theraband VCs to remind pt to keep elbows extended    Theraband Level (Shoulder Horizontal ABduction)  Level 2 (Red)    External Rotation  Strengthening;Both;5 reps;Theraband    Theraband Level (Shoulder External Rotation)  Level 2 (Red)    Flexion  Strengthening;Both;5 reps;Theraband Narrow and Wide Grip, 5 times each    Theraband Level (Shoulder Flexion)  Level 2 (Red)    Other Supine Exercises  Bil D2 with red theraband 5 times each side returning correct therapist demonstration      Manual Therapy   Manual Therapy  Soft tissue mobilization;Passive ROM;Other (comment)    Manual therapy comments  manual isometrics in all directions with arm at ~70-80 degrees abduction, 2 sets of 30 seconds each side, this very challenging for pt    Soft tissue mobilization  To bil upper traps  during shoulder P/ROM to decrease muscle guarding/tightness limiting her shoulder ROM    Passive ROM  to bilateral shoulders in direction of flexion, abduction, ER to patients tolerance                     Long Term Clinic Goals - 05/12/17 1037      CC Long Term Goal  #1   Title  Pt will  verbalize lymphedema risk reduction.    Time  4    Status  Achieved      CC Long Term Goal  #2   Title  Pt will be independent in home program for increased strength     Time  4    Period  Weeks    Status  On-going      CC Long Term Goal  #3   Title  pt will reduce Quick DASH score to < 39 indicating an improvment in UE function     Baseline  52.27    Time  4    Status  New         Plan - 05/18/17 1018    Clinical Impression Statement  Pt tolerated treatment well today reporting flare up of pain reduced from last session. Progressed supine scapular series to include red theraband which she tolerated 5 reps of all wel with no increased pain. Her end P/ROM conts to be very tight and limited though she felt the soft tissue to her bil upper traps beneficial during P/ROM.  Also instructed pt to include cervical retraction with supne scap series (without resistance when she does this).    Rehab Potential  Good    Clinical Impairments Affecting Rehab Potential  previous bilateral total shoulder replacement, arthritis in neck     PT Frequency  2x / week    PT Duration  4 weeks    PT Treatment/Interventions  ADLs/Self Care Home Management;Patient/family education;Orthotic Fit/Training;Manual techniques;Passive range of motion;Neuromuscular re-education;Therapeutic exercise;Therapeutic activities    PT Next Visit Plan  continue review of supine scapular series with progression prn.  ROM within tolerance with precaution for total shoulders   add basic core exercsies     Consulted and Agree with Plan of Care  Patient       Patient will benefit from skilled therapeutic intervention in order  to improve the following deficits and impairments:  Obesity, Decreased knowledge of precautions, Decreased strength, Impaired UE functional use, Pain, Decreased range of motion, Postural dysfunction  Visit Diagnosis: Aftercare following surgery for neoplasm  Abnormal posture  Muscle weakness (generalized)     Problem List Patient Active Problem List   Diagnosis Date Noted  . Malignant neoplasm of upper-outer quadrant of right breast in female, estrogen receptor positive ([place]) 03/13/2017  . Acute frontal sinusitis 05/13/2013  . Precordial pain 05/10/2013  . Fasting hyperglycemia 06/13/2011  . HYPOTHYROIDISM 05/06/2010  . DYSLIPIDEMIA 05/06/2010  . ARTHRITIS, SHOULDER 05/06/2010  . FATIGUE, CHRONIC 05/06/2010  . Seasonal and perennial allergic rhinitis 03/14/2010  . ANEMIA-IRON DEFICIENCY 10/29/2009  . HYPERTENSION 10/29/2009  . GERD 10/29/2009  . COLONIC POLYPS, HX OF 10/29/2009  . UMBILICAL HERNIA, HX OF 20/80/2233    Otelia Limes, PTA 05/18/2017, 10:26 AM  [place], [place], [address] Phone: [phone]   Fax:  [phone]  Name: Brenda Stephens MRN: [id number] Date of Birth: [birth year]

## 2017-05-19 ENCOUNTER — Ambulatory Visit: Payer: MEDICARE

## 2017-05-19 DIAGNOSIS — Z483 Aftercare following surgery for neoplasm: Secondary | ICD-10-CM | POA: Diagnosis not present

## 2017-05-19 DIAGNOSIS — M6281 Muscle weakness (generalized): Secondary | ICD-10-CM

## 2017-05-19 DIAGNOSIS — R293 Abnormal posture: Secondary | ICD-10-CM

## 2017-05-19 NOTE — Patient Instructions (Signed)
Bridging    Slowly raise buttocks from floor, keeping stomach tight. Repeat __10__ times per set. Do _1-2___ sets per session. Do _2___ sessions per day.   Pelvic Tilt: Posterior - Legs Bent (Supine)    Tighten stomach and flatten back by rolling pelvis down. Hold __3__ seconds. Relax. Repeat __10__ times per set. Do __1-2__ sets per session. Do _1___ sessions per day.   Then with holding tilt: 1. Open/close knees 5 times, repeating 3 sets of this.  Do 1 session a day. 2. Alternate marching 5 times each leg, 2-3 sets of this. Do 1 session a day.   Cancer Rehab [phone]

## 2017-05-19 NOTE — Therapy (Signed)
[place], [place], [address] Phone: [phone]   Fax:  [phone]  Physical Therapy Treatment  Patient Details  Name: Brenda Stephens MRN: [id number] Date of Birth: 1940-10-19 Referring Provider: Leron Croak    Encounter Date: 05/19/2017  PT End of Session - 05/19/17 1017    Visit Number  6    Number of Visits  9    Date for PT Re-Evaluation  05/29/17    PT Start Time  0931    PT Stop Time  1015    PT Time Calculation (min)  44 min    Activity Tolerance  Patient tolerated treatment well    Behavior During Therapy  [facility] for tasks assessed/performed       Past Medical History:  Diagnosis Date  . Allergy    latex  . Anemia   . Anxiety   . Arthritis   . Cancer ([place]) 01/2017   right breast cancer  . Depression   . Dyslipidemia   . Eustachian tube dysfunction   . GERD (gastroesophageal reflux disease)   . Hx of cardiovascular stress test    Myoview 10/13:  apical thinning, EF 72%, no ischemia  . Hx of colonic polyps    Dr Watt Climes  . Hypertension   . Hypothyroidism   . Other abnormal glucose     Past Surgical History:  Procedure Laterality Date  . ABDOMINAL HYSTERECTOMY     BSO for uterine fibroids  . APPENDECTOMY    . CATARACT EXTRACTION, BILATERAL    . CHOLECYSTECTOMY    . COLONOSCOPY W/ POLYPECTOMY     Dr Watt Climes  . EXCISION BONE CYST BENIGN TUMOR TALUS /CALCANEUS Right 10/07/2012   Performed by Ninetta Lights, MD at [facility]  . EXPLORATORY LAPAROTOMY    . RIGHT BREAST LUMPECTOMY WITH RADIOACTIVE SEED AND RIGHT AXILLARY SENTINEL  NODE BIOPSY ERAS PATHWAY Right 03/16/2017   Performed by [name], MD at [facility]  . RIGHT REPAIR RUPTURE TENDON PRIMARY OPEN/PERCUTANEOUS Right 10/07/2012   Performed by Ninetta Lights, MD at [facility]  . SINUS SURGERY WITH INSTATRAK    . TOTAL SHOULDER REPLACEMENT     Right shoulder  .  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 TUBAL LIGATION      There were no vitals filed for this visit.  Subjective Assessment - 05/19/17 0933    Subjective  I feel okay after yesterday but my Rt shoulder blade is bothering me today. My upper shoulders/neck area feels better today.    Pertinent History  right lumpectomy on 9/17/ 2018 with 4 deep axillary lymph nodes removed.  No chemo or radiation.  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  Yes    Pain Score  4     Pain Location  Scapula    Pain Orientation  Right    Pain Descriptors / Indicators  Pressure    Pain Onset  More than a month ago    Pain Frequency  Constant    Aggravating Factors   just kind of comes and goes    Pain Relieving Factors  nothing really         [facility] PT Assessment - 05/19/17 0001  AROM   Right Shoulder Flexion  130 Degrees    Right Shoulder ABduction  118 Degrees    Left Shoulder Flexion  104 Degrees in sitting and tactile cue to decrease hiking    Left Shoulder ABduction  108 Degrees                  [facility] Adult PT Treatment/Exercise - 05/19/17 0001      Lumbar Exercises: Supine   Ab Set  10 reps pelvic tilt    Clam  Other (comment) 2 sets of 3 reps with pelvic tilt    Bent Knee Raise  5 reps 2 sets with pelvic tilt    Bridge  10 reps;2 seconds      Shoulder Exercises: Pulleys   Flexion  2 minutes;Limitations    Flexion Limitations  VCs and demonstration to decrease bil scapular compensation    ABduction  2 minutes;Limitations    ABduction Limitations  In scaption and to pts tolerance      Manual Therapy   Manual Therapy  Soft tissue mobilization;Passive ROM;Other (comment)    Soft tissue mobilization  To bil upper traps during shoulder P/ROM to decrease muscle guarding/tightness limiting her shoulder ROM    Passive ROM  to  bilateral shoulders in direction of flexion, abduction, ER to patients tolerance             PT Education - 05/19/17 0953    Education provided  Yes    Education Details  Supine lumbar stabs     Person(s) Educated  Patient    Methods  Explanation;Demonstration;Handout    Comprehension  Verbalized understanding;Returned demonstration             [facility] Goals - 05/12/17 1037      CC Long Term Goal  #1   Title  Pt will  verbalize lymphedema risk reduction.    Time  4    Status  Achieved      CC Long Term Goal  #2   Title  Pt will be independent in home program for increased strength     Time  4    Period  Weeks    Status  On-going      CC Long Term Goal  #3   Title  pt will reduce Quick DASH score to < 39 indicating an improvment in UE function     Baseline  52.27    Time  4    Status  New         Plan - 05/19/17 1017    Clinical Impression Statement  Progressed pt today to include AA/ROM stretching with pulleys which she tolerated well. Also added core stabilization exercises and added this to HEP. Pt did very well with these exercises. Continued with P/ROM stretching, pt very limited with end ROM due to history of shoulder surgeries and though does require multiple VCs to relax durig session she is able to do this and seemed to do better with this today.     Rehab Potential  Good    Clinical Impairments Affecting Rehab Potential  previous bilateral total shoulder replacement, arthritis in neck     PT Frequency  2x / week    PT Duration  4 weeks    PT Treatment/Interventions  ADLs/Self Care Home Management;Patient/family education;Orthotic Fit/Training;Manual techniques;Passive range of motion;Neuromuscular re-education;Therapeutic exercise;Therapeutic activities    PT Next Visit Plan  continue review of supine scapular series with progression prn.  ROM within tolerance  with precaution for total shoulders   Review basic core exercsies     Consulted and  Agree with Plan of Care  Patient       Patient will benefit from skilled therapeutic intervention in order to improve the following deficits and impairments:  Obesity, Decreased knowledge of precautions, Decreased strength, Impaired UE functional use, Pain, Decreased range of motion, Postural dysfunction  Visit Diagnosis: Aftercare following surgery for neoplasm  Abnormal posture  Muscle weakness (generalized)     Problem List Patient Active Problem List   Diagnosis Date Noted  . Malignant neoplasm of upper-outer quadrant of right breast in female, estrogen receptor positive ([place]) 03/13/2017  . Acute frontal sinusitis 05/13/2013  . Precordial pain 05/10/2013  . Fasting hyperglycemia 06/13/2011  . HYPOTHYROIDISM 05/06/2010  . DYSLIPIDEMIA 05/06/2010  . ARTHRITIS, SHOULDER 05/06/2010  . FATIGUE, CHRONIC 05/06/2010  . Seasonal and perennial allergic rhinitis 03/14/2010  . ANEMIA-IRON DEFICIENCY 10/29/2009  . HYPERTENSION 10/29/2009  . GERD 10/29/2009  . COLONIC POLYPS, HX OF 10/29/2009  . UMBILICAL HERNIA, HX OF 17/51/0258    Otelia Limes, PTA 05/19/2017, 10:20 AM  [place], [place], [address] Phone: [phone]   Fax:  [phone]  Name: CHARNAE LILL MRN: [id number] Date of Birth: September 16, 1940

## 2017-05-25 ENCOUNTER — Ambulatory Visit: Payer: MEDICARE

## 2017-05-25 DIAGNOSIS — M6281 Muscle weakness (generalized): Secondary | ICD-10-CM

## 2017-05-25 DIAGNOSIS — R293 Abnormal posture: Secondary | ICD-10-CM

## 2017-05-25 DIAGNOSIS — Z483 Aftercare following surgery for neoplasm: Secondary | ICD-10-CM

## 2017-05-25 NOTE — Therapy (Addendum)
[place], [place], [address] Phone: [phone]   Fax:  [phone]  Physical Therapy Treatment  Patient Details  Name: Brenda Stephens MRN: [id number] Date of Birth: Aug 29, 1940 Referring Provider: Donne Hazel    Encounter Date: 05/25/2017    Past Medical History:  Diagnosis Date  . Allergy    latex  . Anemia   . Anxiety   . Arthritis   . Cancer ([place]) 01/2017   right breast cancer  . Depression   . Dyslipidemia   . Eustachian tube dysfunction   . GERD (gastroesophageal reflux disease)   . Hx of cardiovascular stress test    Myoview 10/13:  apical thinning, EF 72%, no ischemia  . Hx of colonic polyps    Dr Watt Climes  . Hypertension   . Hypothyroidism   . Other abnormal glucose     Past Surgical History:  Procedure Laterality Date  . ABDOMINAL HYSTERECTOMY     BSO for uterine fibroids  . ACHILLES TENDON SURGERY Right 10/07/2012   Procedure: RIGHT REPAIR RUPTURE TENDON PRIMARY OPEN/PERCUTANEOUS ;  Surgeon: Ninetta Lights, MD;  Location: [facility];  Service: Orthopedics;  Laterality: Right;  . APPENDECTOMY    . BREAST LUMPECTOMY WITH RADIOACTIVE SEED AND SENTINEL LYMPH NODE BIOPSY Right 03/16/2017   Procedure: RIGHT BREAST LUMPECTOMY WITH RADIOACTIVE SEED AND RIGHT AXILLARY SENTINEL  NODE BIOPSY ERAS PATHWAY;  Surgeon: [name], MD;  Location: [facility];  Service: General;  Laterality: Right;  PECTORAL BLOCK  . CATARACT EXTRACTION, BILATERAL    . CHOLECYSTECTOMY    . COLONOSCOPY W/ POLYPECTOMY     Dr Watt Climes  . EAR CYST EXCISION Right 10/07/2012   Procedure: EXCISION BONE CYST BENIGN TUMOR [name] ;  Surgeon: Ninetta Lights, MD;  Location: [facility];  Service: Orthopedics;  Laterality: Right;  . EXPLORATORY LAPAROTOMY    . SINUS SURGERY WITH INSTATRAK    . TOTAL SHOULDER REPLACEMENT     Right shoulder  . TOTAL SHOULDER  REPLACEMENT  05/2010   Left  . TUBAL LIGATION      There were no vitals filed for this visit.                                   [facility] Goals - 05/27/17 0946      CC Long Term Goal  #1   Title  Pt will  verbalize lymphedema risk reduction.    Status  Achieved      CC Long Term Goal  #2   Title  Pt will be independent in home program for increased strength     Status  On-going      CC Long Term Goal  #3   Title  pt will reduce Quick DASH score to < 39 indicating an improvment in UE function     Status  On-going      CC Long Term Goal  #4   Title  Pt will report her pain is reduced by 50%      Time  4    Period  Weeks    Status  New         Plan - 06/15/17 7062    Clinical Impression Statement  pt continues to have pain in her shoulders and neck.  She is feels that she is doing the exercises that she can  at home and wants to focus on soft tissue work when she come here to see if it will help with the pain. She wants to return to water aerobics which will help her.     Clinical Impairments Affecting Rehab Potential  previous bilateral total shoulder replacement, arthritis in neck     PT Treatment/Interventions  ADLs/Self Care Home Management;Patient/family education;Orthotic Fit/Training;Manual techniques;Passive range of motion;Neuromuscular re-education;Therapeutic exercise;Therapeutic activities    PT Next Visit Plan  check to see if pt is following up with exercise, If so, focus on soft tissue work for paiin management        Patient will benefit from skilled therapeutic intervention in order to improve the following deficits and impairments:  Obesity, Decreased knowledge of precautions, Decreased strength, Impaired UE functional use, Pain, Decreased range of motion, Postural dysfunction  Visit Diagnosis: Aftercare following surgery for neoplasm  Abnormal posture  Muscle weakness (generalized)     Problem List Patient  Active Problem List   Diagnosis Date Noted  . Malignant neoplasm of upper-outer quadrant of right breast in female, estrogen receptor positive ([place]) 03/13/2017  . Acute frontal sinusitis 05/13/2013  . Precordial pain 05/10/2013  . Fasting hyperglycemia 06/13/2011  . HYPOTHYROIDISM 05/06/2010  . DYSLIPIDEMIA 05/06/2010  . ARTHRITIS, SHOULDER 05/06/2010  . FATIGUE, CHRONIC 05/06/2010  . Seasonal and perennial allergic rhinitis 03/14/2010  . ANEMIA-IRON DEFICIENCY 10/29/2009  . HYPERTENSION 10/29/2009  . GERD 10/29/2009  . COLONIC POLYPS, HX OF 10/29/2009  . UMBILICAL HERNIA, HX OF 48/18/5631   Donato Heinz. Owens Shark PT  Norwood Levo,  06/15/2017, 8:39 AM  [place], [place], [address] Phone: [phone]   Fax:  [phone]  Name: Brenda Stephens MRN: [id number] Date of Birth: 1941-06-26

## 2017-05-27 ENCOUNTER — Ambulatory Visit: Payer: MEDICARE | Admitting: Physical Therapy

## 2017-05-27 ENCOUNTER — Encounter: Payer: Self-pay | Admitting: Physical Therapy

## 2017-05-27 DIAGNOSIS — Z483 Aftercare following surgery for neoplasm: Secondary | ICD-10-CM

## 2017-05-27 DIAGNOSIS — R293 Abnormal posture: Secondary | ICD-10-CM

## 2017-05-27 DIAGNOSIS — M6281 Muscle weakness (generalized): Secondary | ICD-10-CM

## 2017-05-27 NOTE — Therapy (Signed)
[place], [place], [address] Phone: [phone]   Fax:  [phone]  Physical Therapy Treatment  Patient Details  Name: Brenda Stephens MRN: [id number] Date of Birth: 1940-10-26 Referring Provider: Donne Hazel    Encounter Date: 05/27/2017  PT End of Session - 05/27/17 1217    Visit Number  8    Number of Visits  17    Date for PT Re-Evaluation  06/26/17    PT Start Time  0930    PT Stop Time  1015    PT Time Calculation (min)  45 min    Activity Tolerance  Patient tolerated treatment well    Behavior During Therapy  [facility] for tasks assessed/performed       Past Medical History:  Diagnosis Date  . Allergy    latex  . Anemia   . Anxiety   . Arthritis   . Cancer ([place]) 01/2017   right breast cancer  . Depression   . Dyslipidemia   . Eustachian tube dysfunction   . GERD (gastroesophageal reflux disease)   . Hx of cardiovascular stress test    Myoview 10/13:  apical thinning, EF 72%, no ischemia  . Hx of colonic polyps    Dr Watt Climes  . Hypertension   . Hypothyroidism   . Other abnormal glucose     Past Surgical History:  Procedure Laterality Date  . ABDOMINAL HYSTERECTOMY     BSO for uterine fibroids  . ACHILLES TENDON SURGERY Right 10/07/2012   Procedure: RIGHT REPAIR RUPTURE TENDON PRIMARY OPEN/PERCUTANEOUS ;  Surgeon: Ninetta Lights, MD;  Location: [place];  Service: Orthopedics;  Laterality: Right;  . APPENDECTOMY    . BREAST LUMPECTOMY WITH RADIOACTIVE SEED AND SENTINEL LYMPH NODE BIOPSY Right 03/16/2017   Procedure: RIGHT BREAST LUMPECTOMY WITH RADIOACTIVE SEED AND RIGHT AXILLARY SENTINEL  NODE BIOPSY ERAS PATHWAY;  Surgeon: [name], MD;  Location: [place];  Service: General;  Laterality: Right;  PECTORAL BLOCK  . CATARACT EXTRACTION, BILATERAL    . CHOLECYSTECTOMY    . COLONOSCOPY W/ POLYPECTOMY     Dr Watt Climes  . EAR CYST EXCISION Right  10/07/2012   Procedure: EXCISION BONE CYST BENIGN TUMOR [name] ;  Surgeon: Ninetta Lights, MD;  Location: [facility];  Service: Orthopedics;  Laterality: Right;  . EXPLORATORY LAPAROTOMY    . SINUS SURGERY WITH INSTATRAK    . TOTAL SHOULDER REPLACEMENT     Right shoulder  . 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 TUBAL LIGATION      There were no vitals filed for this visit.  Subjective Assessment - 05/27/17 0942    Subjective  Pt thinks she might be having problems with cold weather causing more.  She is real sore in the right shoulder upper back that shoots up in to her head  she ususally gets relief after treatment for a  couple of days and and wants to continue PT for another month     Pertinent History  right lumpectomy on 9/17/ 2018 with 4 deep axillary lymph nodes removed.  No chemo or radiation.  Past Hx:  bilateral shoulder replacements > 10 years with limited ROM, arthritis in neck and hands     Patient Stated Goals  wants to gain as much strength as she can   05/27/2017 she does not feel much stronger yet even though she has been doing some exercises at home  Currently in Pain?  Yes    Pain Score  5     Pain Location  Shoulder    Pain Orientation  Right    Pain Descriptors / Indicators  Aching    Pain Type  Chronic pain    Pain Onset  More than a month ago    Pain Frequency  Constant    Aggravating Factors   cold     Pain Relieving Factors  sometimes I just have to lie down and get straight          [facility] PT Assessment - 05/27/17 0001      Assessment   Medical Diagnosis   right lumpectomy     Referring Provider  Donne Hazel     Onset Date/Surgical Date  03/16/17      Prior Function   Level of Independence  Independent                  [facility] Adult PT Treatment/Exercise - 05/27/17 0001      Elbow Exercises   Elbow Flexion  Strengthening;Right;Left;10 reps;Bar weights/barbell 2 pounds       Lumbar Exercises: Supine    Clam  10 reps With pelvic tilt    Bent Knee Raise  5 reps 2 sets of 5 with pelvic tilt      Lumbar Exercises: Sidelying   Clam  10 reps each leg     Hip Abduction  10 reps each leg       Shoulder Exercises: Supine   Protraction  AROM;Left;Both;10 reps    Flexion  AROM;Right;Left;5 reps shoulders are very painful today       Shoulder Exercises: Sidelying   External Rotation  Strengthening;Right;Left;10 reps 1pound  also did isometrics with 1# x 10     ABduction  AROM;Right;Left;10 reps toward ceiling. within pain tolerance       Manual Therapy   Manual Therapy  Passive ROM;Other (comment)    Soft tissue mobilization  To bil upper traps during shoulder P/ROM to decrease muscle guarding/tightness limiting her shoulder ROM    Passive ROM  to bilateral shoulders in direction of flexion, abduction, ER to patients tolerance                     [facility] Goals - 05/27/17 0946      CC Long Term Goal  #1   Title  Pt will  verbalize lymphedema risk reduction.    Status  Achieved      CC Long Term Goal  #2   Title  Pt will be independent in home program for increased strength     Status  On-going      CC Long Term Goal  #3   Title  pt will reduce Quick DASH score to < 39 indicating an improvment in UE function     Status  On-going      CC Long Term Goal  #4   Title  Pt will report her pain is reduced by 50%      Time  4    Period  Weeks    Status  New           Patient will benefit from skilled therapeutic intervention in order to improve the following deficits and impairments:     Visit Diagnosis: Aftercare following surgery for neoplasm - Plan: PT plan of care cert/re-cert  Muscle weakness (generalized) - Plan: PT plan of care cert/re-cert  Abnormal posture - Plan:  PT plan of care cert/re-cert     Problem List Patient Active Problem List   Diagnosis Date Noted  . Malignant neoplasm of upper-outer quadrant of right breast in female, estrogen  receptor positive ([place]) 03/13/2017  . Acute frontal sinusitis 05/13/2013  . Precordial pain 05/10/2013  . Fasting hyperglycemia 06/13/2011  . HYPOTHYROIDISM 05/06/2010  . DYSLIPIDEMIA 05/06/2010  . ARTHRITIS, SHOULDER 05/06/2010  . FATIGUE, CHRONIC 05/06/2010  . Seasonal and perennial allergic rhinitis 03/14/2010  . ANEMIA-IRON DEFICIENCY 10/29/2009  . HYPERTENSION 10/29/2009  . GERD 10/29/2009  . COLONIC POLYPS, HX OF 10/29/2009  . UMBILICAL HERNIA, HX OF 48/18/5631   Donato Heinz. Owens Shark PT  Norwood Levo 05/27/2017, 12:21 PM  [place], [place], [address] Phone: [phone]   Fax:  [phone]  Name: KAMREN HESKETT MRN: [id number] Date of Birth: 1940-07-11

## 2017-06-02 ENCOUNTER — Ambulatory Visit: Payer: MEDICARE | Admitting: Physical Therapy

## 2017-06-09 ENCOUNTER — Encounter: Payer: MEDICARE | Admitting: Obstetrics & Gynecology

## 2017-06-15 ENCOUNTER — Ambulatory Visit: Payer: MEDICARE | Attending: General Surgery

## 2017-06-15 DIAGNOSIS — R293 Abnormal posture: Secondary | ICD-10-CM | POA: Diagnosis present

## 2017-06-15 DIAGNOSIS — Z483 Aftercare following surgery for neoplasm: Secondary | ICD-10-CM | POA: Diagnosis present

## 2017-06-15 NOTE — Therapy (Signed)
[place], [place], [address] Phone: [phone]   Fax:  [phone]  Physical Therapy Treatment  Patient Details  Name: Brenda Stephens MRN: [id number] Date of Birth: 1940/09/21 Referring Provider: Donne Hazel    Encounter Date: 06/15/2017  PT End of Session - 06/15/17 1102    Visit Number  9    Number of Visits  17    Date for PT Re-Evaluation  06/26/17    PT Start Time  1020    PT Stop Time  1102    PT Time Calculation (min)  42 min    Activity Tolerance  Patient tolerated treatment well    Behavior During Therapy  [facility] for tasks assessed/performed       Past Medical History:  Diagnosis Date  . Allergy    latex  . Anemia   . Anxiety   . Arthritis   . Cancer ([place]) 01/2017   right breast cancer  . Depression   . Dyslipidemia   . Eustachian tube dysfunction   . GERD (gastroesophageal reflux disease)   . Hx of cardiovascular stress test    Myoview 10/13:  apical thinning, EF 72%, no ischemia  . Hx of colonic polyps    Dr Watt Climes  . Hypertension   . Hypothyroidism   . Other abnormal glucose     Past Surgical History:  Procedure Laterality Date  . ABDOMINAL HYSTERECTOMY     BSO for uterine fibroids  . ACHILLES TENDON SURGERY Right 10/07/2012   Procedure: RIGHT REPAIR RUPTURE TENDON PRIMARY OPEN/PERCUTANEOUS ;  Surgeon: Ninetta Lights, MD;  Location: [facility];  Service: Orthopedics;  Laterality: Right;  . APPENDECTOMY    . BREAST LUMPECTOMY WITH RADIOACTIVE SEED AND SENTINEL LYMPH NODE BIOPSY Right 03/16/2017   Procedure: RIGHT BREAST LUMPECTOMY WITH RADIOACTIVE SEED AND RIGHT AXILLARY SENTINEL  NODE BIOPSY ERAS PATHWAY;  Surgeon: [name], MD;  Location: [place];  Service: General;  Laterality: Right;  PECTORAL BLOCK  . CATARACT EXTRACTION, BILATERAL    . CHOLECYSTECTOMY    . COLONOSCOPY W/ POLYPECTOMY     Dr Watt Climes  . EAR CYST EXCISION Right  10/07/2012   Procedure: EXCISION BONE CYST BENIGN TUMOR [name] ;  Surgeon: Ninetta Lights, MD;  Location: [facility] [place];  Service: Orthopedics;  Laterality: Right;  . EXPLORATORY LAPAROTOMY    . SINUS SURGERY WITH INSTATRAK    . TOTAL SHOULDER REPLACEMENT     Right shoulder  . 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 TUBAL LIGATION      There were no vitals filed for this visit.  Subjective Assessment - 06/15/17 1024    Subjective  I'm surprisingly doing pretty well since I was here last. My bil shoulders are doing really well and my neck still has good days and bad days but just doesn't seem to bother me as often. I haven't been doing the exercises hardly since I was here last because we were out of town. But overall I am feeling ood and want to make this week my last week.      Pertinent History  right lumpectomy on 9/17/ 2018 with 4 deep axillary lymph nodes removed.  No chemo or radiation.  Past Hx:  bilateral shoulder replacements > 10 years with limited ROM, arthritis in neck and hands  [facility] Adult PT Treatment/Exercise - 06/15/17 0001      Manual Therapy   Manual Therapy  Passive ROM;Other (comment)    Soft tissue mobilization  To bil upper traps during shoulder P/ROM to decrease muscle guarding/tightness limiting her shoulder ROM    Passive ROM  Cervical P/ROM during soft tissue work in to bil side bend and rotations                     [facility] Goals - 05/27/17 0946      CC Long Term Goal  #1   Title  Pt will  verbalize lymphedema risk reduction.    Status  Achieved      CC Long Term Goal  #2   Title  Pt will be independent in home program for increased strength     Status  On-going      CC Long Term Goal  #3   Title  pt will reduce Quick DASH score to < 39 indicating an improvment in UE function     Status  On-going      CC Long Term Goal  #4   Title  Pt will report her pain is  reduced by 50%      Time  4    Period  Weeks    Status  New         Plan - 06/15/17 1227    Clinical Impression Statement  Pt hasn't been here for 3 weeks due to, part of the time, being out of town and the bad weather last week. Pt reports overall feeling as she has improved since being here and is ready to D/C from therapy end of this week. Wants to focus on soft tissue work to cervical muscles as this is her primary concern.     Rehab Potential  Good    Clinical Impairments Affecting Rehab Potential  previous bilateral total shoulder replacement, arthritis in neck     PT Frequency  2x / week    PT Duration  4 weeks    PT Treatment/Interventions  ADLs/Self Care Home Management;Patient/family education;Orthotic Fit/Training;Manual techniques;Passive range of motion;Neuromuscular re-education;Therapeutic exercise;Therapeutic activities    PT Next Visit Plan  check to see if pt is following up with exercise, If so, focus on soft tissue work for pain management; D/C and Gcode next vsit.     Consulted and Agree with Plan of Care  Patient       Patient will benefit from skilled therapeutic intervention in order to improve the following deficits and impairments:  Obesity, Decreased knowledge of precautions, Decreased strength, Impaired UE functional use, Pain, Decreased range of motion, Postural dysfunction  Visit Diagnosis: Aftercare following surgery for neoplasm  Abnormal posture     Problem List Patient Active Problem List   Diagnosis Date Noted  . Malignant neoplasm of upper-outer quadrant of right breast in female, estrogen receptor positive ([place]) 03/13/2017  . Acute frontal sinusitis 05/13/2013  . Precordial pain 05/10/2013  . Fasting hyperglycemia 06/13/2011  . HYPOTHYROIDISM 05/06/2010  . DYSLIPIDEMIA 05/06/2010  . ARTHRITIS, SHOULDER 05/06/2010  . FATIGUE, CHRONIC 05/06/2010  . Seasonal and perennial allergic rhinitis 03/14/2010  . ANEMIA-IRON DEFICIENCY 10/29/2009  .  HYPERTENSION 10/29/2009  . GERD 10/29/2009  . COLONIC POLYPS, HX OF 10/29/2009  . UMBILICAL HERNIA, HX OF 07/08/3233    Otelia Limes, PTA 06/15/2017, 12:30 PM  [place], [place], [address] Phone: [phone]  Fax:  [phone]  Name: Brenda Stephens MRN: [id number] Date of Birth: [birth year]

## 2017-06-17 ENCOUNTER — Ambulatory Visit: Payer: MEDICARE

## 2017-06-17 DIAGNOSIS — Z483 Aftercare following surgery for neoplasm: Secondary | ICD-10-CM | POA: Diagnosis not present

## 2017-06-17 DIAGNOSIS — R293 Abnormal posture: Secondary | ICD-10-CM

## 2017-06-17 NOTE — Therapy (Signed)
[place], [place], [address] Phone: [phone]   Fax:  [phone]  Physical Therapy Treatment  Patient Details  Name: Brenda Stephens MRN: [id number] Date of Birth: 09-16-1940 Referring Provider: Donne Stephens    Encounter Date: 06/17/2017  PT End of Session - 06/17/17 1156    Visit Number  10    Number of Visits  17    Date for PT Re-Evaluation  06/26/17    PT Start Time  1106    PT Stop Time  1147    PT Time Calculation (min)  41 min    Activity Tolerance  Patient tolerated treatment well    Behavior During Therapy  [facility] for tasks assessed/performed       Past Medical History:  Diagnosis Date  . Allergy    latex  . Anemia   . Anxiety   . Arthritis   . Cancer ([place]renda [place]) 01/2017   right breast cancer  . Depression   . Dyslipidemia   . Eustachian tube dysfunction   . GERD (gastroesophageal reflux disease)   . Hx of cardiovascular stress test    Myoview 10/13:  apical thinning, EF 72%, no ischemia  . Hx of colonic polyps    Dr Brenda Stephens  . Hypertension   . Hypothyroidism   . Other abnormal glucose     Past Surgical History:  Procedure Laterality Date  . ABDOMINAL HYSTERECTOMY     BSO for uterine fibroids  . ACHILLES TENDON SURGERY Right 10/07/2012   Procedure: RIGHT REPAIR RUPTURE TENDON PRIMARY OPEN/PERCUTANEOUS ;  Surgeon: Brenda Lights, MD;  Location: [place];  Service: Orthopedics;  Laterality: Right;  . APPENDECTOMY    . BREAST LUMPECTOMY WITH RADIOACTIVE SEED AND SENTINEL LYMPH NODE BIOPSY Right 03/16/2017   Procedure: RIGHT BREAST LUMPECTOMY WITH RADIOACTIVE SEED AND RIGHT AXILLARY SENTINEL  NODE BIOPSY ERAS PATHWAY;  Surgeon: Brenda Bookbinder, MD;  Location: [place];  Service: General;  Laterality: Right;  PECTORAL BLOCK  . CATARACT EXTRACTION, BILATERAL    . CHOLECYSTECTOMY    . COLONOSCOPY W/ POLYPECTOMY     Dr Brenda Stephens  . EAR CYST EXCISION Right  10/07/2012   Procedure: EXCISION BONE CYST BENIGN TUMOR Brenda Stephens ;  Surgeon: Brenda Lights, MD;  Location: [place];  Service: Orthopedics;  Laterality: Right;  . EXPLORATORY LAPAROTOMY    . SINUS SURGERY WITH INSTATRAK    . TOTAL SHOULDER REPLACEMENT     Right shoulder  . 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 TUBAL LIGATION      There were no vitals filed for this visit.  Subjective Assessment - 06/17/17 1108    Subjective  I have had almost ad no pain in my neck in shoulders since I was here last. Just some tightness right now. I am ready to make to day my last day.      Pertinent History  right lumpectomy on 9/17/ 2018 with 4 deep axillary lymph nodes removed.  No chemo or radiation.  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  No/denies  Brenda Stephens - 06/17/17 0001    Open a tight or new jar  Moderate difficulty    Do heavy household chores (wash walls, wash floors)  Mild difficulty    Carry a shopping bag or briefcase  Mild difficulty    Wash your back  Severe difficulty    Use a knife to cut food  No difficulty    Recreational activities in which you take some force or impact through your arm, shoulder, or hand (golf, hammering, tennis)  Moderate difficulty    During the past week, to what extent has your arm, shoulder or hand problem interfered with your normal social activities with family, friends, neighbors, or groups?  Slightly    During the past week, to what extent has your arm, shoulder or hand problem limited your work or other regular daily activities  Modererately    Arm, shoulder, or hand pain.  Moderate    Tingling (pins and needles) in your arm, shoulder, or hand   Mild    Difficulty Sleeping  Moderate difficulty    DASH Score  38.64 %            [facility] Adult PT Treatment/Exercise - 06/17/17 0001      Manual Therapy   Soft tissue mobilization  Pt wanted this done in sitting today so as ot to mess up her hair: With Biotone and biofreeze to bil cervical and upper traps muscles with trigger point release at Lt periscapular area and Lt posterior cerical area                     [facility] Goals - 06/17/17 1110      CC Long Term Goal  #1   Title  Pt will  verbalize lymphedema risk reduction.    Status  Achieved      CC Long Term Goal  #2   Title  Pt will be independent in home program for increased strength     Status  Achieved      CC Long Term Goal  #3   Title  pt will reduce Quick DASH score to < 39 indicating an improvment in UE function     Baseline  52.27; 38.64-12/19/18    Status  Achieved      CC Long Term Goal  #4   Title  Pt will report her pain is reduced by 50%      Baseline  75-80% improvement reported at this time-06/17/17    Status  Achieved         Plan - 06/17/17 1157    Clinical Impression Statement  Pt has done very well with therapy and met all goals at this time. She is ready for D/C at this time.     Rehab Potential  Good    Clinical Impairments Affecting Rehab Potential  previous bilateral total shoulder replacement, arthritis in neck     PT Frequency  2x / week    PT Duration  4 weeks    PT Treatment/Interventions  ADLs/Self Care Home Management;Patient/family education;Orthotic Fit/Training;Manual techniques;Passive range of motion;Neuromuscular re-education;Therapeutic exercise;Therapeutic activities    PT Next Visit Plan  D/C this visit.     Consulted and Agree with Plan of Care  Patient       Patient will benefit from skilled therapeutic intervention in order to improve the following deficits and impairments:  Obesity, Decreased knowledge of precautions, Decreased strength, Impaired  UE functional use, Pain, Decreased range of motion,  Postural dysfunction  Visit Diagnosis: Aftercare following surgery for neoplasm  Abnormal posture     Problem List Patient Active Problem List   Diagnosis Date Noted  . Malignant neoplasm of upper-outer quadrant of right breast in female, estrogen receptor positive ([place]) 03/13/2017  . Acute frontal sinusitis 05/13/2013  . Precordial pain 05/10/2013  . Fasting hyperglycemia 06/13/2011  . HYPOTHYROIDISM 05/06/2010  . DYSLIPIDEMIA 05/06/2010  . ARTHRITIS, SHOULDER 05/06/2010  . FATIGUE, CHRONIC 05/06/2010  . Seasonal and perennial allergic rhinitis 03/14/2010  . ANEMIA-IRON DEFICIENCY 10/29/2009  . HYPERTENSION 10/29/2009  . GERD 10/29/2009  . COLONIC POLYPS, HX OF 10/29/2009  . UMBILICAL HERNIA, HX OF 27/15/6648    Otelia Limes, PTA 06/17/2017, 12:01 PM  [place], [place], [address] Phone: [phone]   Fax:  [phone]  Name: Brenda Stephens MRN: [id number] Date of Birth: 21-Mar-1941  PHYSICAL THERAPY DISCHARGE SUMMARY  Visits from Start of Care: 10  Current functional level related to goals / functional outcomes: Goals met as noted above.   Remaining deficits: none   Education / Equipment: Home exercise program.  Plan: Patient agrees to discharge.  Patient goals were met. Patient is being discharged due to meeting the stated rehab goals.  ?????    Serafina Royals, PT 06/17/17 12:25 PM

## 2017-07-07 NOTE — Progress Notes (Signed)
 [facility]  [phone] [phone]     ID: Brenda Stephens DOB: 11/30/1940  MR#: [id number]  CSN#:[id number]  Patient Care Team: McKinney, John, MD as PCP - General (Internal Medicine) ,  C, MD as Consulting Physician (Oncology) Wakefield, Matthew, MD as Consulting Physician (General Surgery) Magod, Marc, MD as Consulting Physician (Gastroenterology) OTHER MD:  CHIEF COMPLAINT: Estrogen receptor positive breast cancer  CURRENT TREATMENT: Tamoxifen   HISTORY OF CURRENT ILLNESS: From the original intake note:  Brenda Stephens had routine bilateral screening mammography at [facility] 01/28/2017. An irregular lesion in the right breast upper outer quadrant was detected and she was recalled for right diagnostic mammography and ultrasonography 02/05/2017. This found the breast density to be category B. In the right breast upper quadrant there was an irregular mass which by ultrasound measured 0.8 cm. It was located at the 11:00 radiant posteriorly. The right axilla was sonographically benign.  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  The patient's subsequent history is as detailed below.  INTERVAL HISTORY: Brenda Stephens returns today for follow-up and treatment of her estrogen receptor positive breast cancer.  She continues on tamoxifen, with good tolerance. She notes occasional hot flashes that do not affect her very much. She denies an increase in vaginal wetness. She obtains this medication for a good price.    REVIEW OF SYSTEMS: Brenda Stephens reports that she is doing alright. She notes that she went to [place] in early December with her husband. She notes that they had to stay an extra  day due to the snow event here. After they came back, she had symptoms of bronchitis. She notes that she enjoyed the holidays. She denies unusual headaches, visual changes, nausea, vomiting, or dizziness. There has been no unusual cough, phlegm production, or pleurisy. This been no change in bowel or bladder habits. She denies unexplained fatigue or unexplained weight loss, bleeding, rash, or fever. A detailed review of systems was otherwise stable.    PAST MEDICAL HISTORY: Past Medical History:  Diagnosis Date  . Allergy    latex  . Anemia   . Anxiety   . Arthritis   . Cancer (HCC) 01/2017   right breast cancer  . Depression   . Dyslipidemia   . Eustachian tube dysfunction   . GERD (gastroesophageal reflux disease)   . Hx of cardiovascular stress test    Myoview 10/13:  apical thinning, EF 72%, no ischemia  . Hx of colonic polyps    Dr Magod  . Hypertension   . Hypothyroidism   . Other abnormal glucose     PAST SURGICAL HISTORY: Past Surgical History:  Procedure Laterality Date  . ABDOMINAL HYSTERECTOMY     BSO for uterine fibroids  . ACHILLES TENDON SURGERY Right 10/07/2012   Procedure: RIGHT REPAIR RUPTURE TENDON PRIMARY OPEN/PERCUTANEOUS ;  Surgeon: Daniel F Murphy, MD;  Location: [facility];  Service: Orthopedics;  Laterality: Right;  . APPENDECTOMY    . BREAST LUMPECTOMY WITH RADIOACTIVE SEED AND SENTINEL LYMPH NODE BIOPSY Right 03/16/2017   Procedure: RIGHT BREAST LUMPECTOMY WITH RADIOACTIVE SEED AND RIGHT AXILLARY SENTINEL  NODE BIOPSY ERAS PATHWAY;  Surgeon: Wakefield, Matthew, MD;  Location: [facility];  Service: General;  Laterality: Right;  PECTORAL   BLOCK  . CATARACT EXTRACTION, BILATERAL    . CHOLECYSTECTOMY    . COLONOSCOPY W/ POLYPECTOMY     Dr Magod  . EAR CYST EXCISION Right 10/07/2012   Procedure: EXCISION BONE CYST BENIGN TUMOR TALUS /CALCANEUS ;  Surgeon: Daniel F Murphy, MD;  Location: [facility];  Service:  Orthopedics;  Laterality: Right;  . EXPLORATORY LAPAROTOMY    . SINUS SURGERY WITH INSTATRAK    . TOTAL SHOULDER REPLACEMENT     Right shoulder  . 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 TUBAL LIGATION      FAMILY HISTORY Family History  Problem Relation Age of Onset  . Hiatal hernia Mother   . Heart attack Mother 78  . Heart disease Father        CAD @ autopsy  . Cancer Father        renal cancer  . Heart attack Brother 47       fatal  . Diabetes Neg Hx   . Stroke Neg Hx   The patient's father died from kidney cancer at age 52. The patient's mother died from a heart attack at age 78. The patient had one brother, who died from a heart attack. She had no sisters. There is no history of breast or ovarian cancer in the family.  GYNECOLOGIC HISTORY:  No LMP recorded. Patient has had a hysterectomy. Menarche age 14, first live birth age 28, the patient is GX P1. She underwent total abdominal hysterectomy with bilateral salpingo-oophorectomy remotely. She used hormone replacement between 5 and 10 years.  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 Her husband was director of [place] for railroad company. He is retired but currently overseas the [employer] property as a manager. Their daughter Jodi is an attorney but currently works as a social worker in [place]. The patient has no grandchildren. She is not a church attender    ADVANCED DIRECTIVES:    HEALTH MAINTENANCE: Social History   Tobacco Use  . Smoking status: Former Smoker    Packs/day: 0.50    Years: 6.00    Pack years: 3.00    Types: Cigarettes    Last attempt to quit: 06/30/1962    Years since quitting: 55.0  . Smokeless tobacco: Never Used  . Tobacco comment: Quit age 26  Substance Use Topics  . Alcohol use: Yes    Alcohol/week: 1.8 oz    Types: 3 Glasses of wine per week    Comment: wine  : < 3 glasses weekly  . Drug use: No     Colonoscopy:May got  PAP:  Status post hysterectomy  Bone density: Due/[facility]   Allergies  Allergen Reactions  . Latex     rash  . Aspirin     GI bleeding  . Oxycodone-Acetaminophen     nausea    Current Outpatient Medications  Medication Sig Dispense Refill  . atorvastatin (LIPITOR) 20 MG tablet TAKE 1 TABLET BY MOUTH AT BEDTIME 90 tablet 1  . dicyclomine (BENTYL) 10 MG capsule Take 10 mg by mouth 4 (four) times daily -  before meals and at bedtime.    . esomeprazole (NEXIUM 24HR) 20 MG capsule Take 20 mg by mouth daily at 12 noon.    . meloxicam (MOBIC) 15 MG tablet Take 15 mg by mouth daily.    . metoprolol succinate (TOPROL-XL) 50 MG 24 hr tablet Take 1 tablet (50 mg total) by mouth   daily. 90 tablet 3  . oxyCODONE (OXY IR/ROXICODONE) 5 MG immediate release tablet Take 1 tablet (5 mg total) by mouth every 6 (six) hours as needed for moderate pain, severe pain or breakthrough pain. 15 tablet 0  . Polyethyl Glycol-Propyl Glycol (SYSTANE OP) Place 1 drop into both eyes 4 (four) times daily.    . Saline (AYR SALINE NASAL DROPS) 0.65 % (SOLN) SOLN Place 1 spray into the nose 3 (three) times daily.    . spironolactone-hydrochlorothiazide (ALDACTAZIDE) 25-25 MG per tablet Take 1 tablet by mouth daily. 90 tablet 3  . SYNTHROID 50 MCG tablet PT NEEDS COMPLETE PHYSICAL.  TAKE 1 TABLET (50MCG TOTAL) BY MOUTH DAILY. 30 tablet 0  . tamoxifen (NOLVADEX) 20 MG tablet Take 1 tablet (20 mg total) by mouth daily. 90 tablet 4  . venlafaxine XR (EFFEXOR-XR) 150 MG 24 hr capsule TAKE 1 CAPSULE (150 MG TOTAL) BY MOUTH DAILY. 90 capsule 2  . verapamil (CALAN-SR) 240 MG CR tablet TAKE 1 TABLET (240 MG TOTAL) BY MOUTH AT BEDTIME. 30 tablet 0  . zolpidem (AMBIEN) 10 MG tablet Take 5 mg by mouth at bedtime.     No current facility-administered medications for this visit.    ppears well  OBJECTIVE: Older white woman in no acute distress Vitals:   07/09/17 1043  BP: (!) 145/61  Pulse: 63  Resp: 20  Temp: 98.4 F (36.9 C)  SpO2:  99%     Body mass index is 31.96 kg/m.   Wt Readings from Last 3 Encounters:  07/09/17 180 lb 6.4 oz (81.8 kg)  04/10/17 181 lb 14.4 oz (82.5 kg)  03/16/17 180 lb (81.6 kg)      ECOG FS:0 - Asymptomatic  Sclerae unicteric, pupils round and equal Oropharynx clear and moist No cervical or supraclavicular adenopathy Lungs no rales or rhonchi Heart regular rate and rhythm Abd soft, nontender, positive bowel sounds MSK no focal spinal tenderness, no upper extremity lymphedema Neuro: nonfocal, well oriented, appropriate affect Breasts: The right breast is status post lumpectomy.  The cosmetic result is very good.  There is no evidence of local recurrence.  The left breast is benign.  Both axillae are benign.  LAB RESULTS:  CMP     Component Value Date/Time   NA 132 (L) 03/13/2017 1252   K 4.3 03/13/2017 1252   CL 98 (L) 03/10/2017 1500   CO2 26 03/13/2017 1252   GLUCOSE 107 03/13/2017 1252   BUN 14.8 03/13/2017 1252   CREATININE 0.8 03/13/2017 1252   CALCIUM 10.0 03/13/2017 1252   PROT 7.2 03/13/2017 1252   ALBUMIN 4.0 03/13/2017 1252   AST 18 03/13/2017 1252   ALT 18 03/13/2017 1252   ALKPHOS 71 03/13/2017 1252   BILITOT 0.54 03/13/2017 1252   GFRNONAA >60 03/10/2017 1500   GFRAA >60 03/10/2017 1500    No results found for: TOTALPROTELP, ALBUMINELP, A1GS, A2GS, BETS, BETA2SER, GAMS, MSPIKE, SPEI  No results found for: KPAFRELGTCHN, LAMBDASER, KAPLAMBRATIO  Lab Results  Component Value Date   WBC 6.5 03/13/2017   NEUTROABS 4.2 03/13/2017   HGB 13.1 03/13/2017   HCT 38.3 03/13/2017   MCV 91.1 03/13/2017   PLT 236 03/13/2017      Chemistry      Component Value Date/Time   NA 132 (L) 03/13/2017 1252   K 4.3 03/13/2017 1252   CL 98 (L) 03/10/2017 1500   CO2 26 03/13/2017 1252   BUN 14.8 03/13/2017 1252   CREATININE 0.8 03/13/2017 1252        Component Value Date/Time   CALCIUM 10.0 03/13/2017 1252   ALKPHOS 71 03/13/2017 1252   AST 18 03/13/2017 1252   ALT  18 03/13/2017 1252   BILITOT 0.54 03/13/2017 1252       No results found for: LABCA2  No components found for: [id number]  No results for input(s): INR in the last 168 hours.  No results found for: LABCA2  No results found for: WUJ811  No results found for: BJY782  No results found for: NFA213  No results found for: CA2729  No components found for: HGQUANT  No results found for: CEA1 / No results found for: CEA1   No results found for: AFPTUMOR  No results found for: CHROMOGRNA  No results found for: PSA1  No visits with results within 3 Day(s) from this visit.  Latest known visit with results is:  Admission on 03/16/2017, Discharged on 03/16/2017  Component Date Value Ref Range Status  . Sodium 03/10/2017 131* 135 - 145 mmol/L Final  . Potassium 03/10/2017 4.2  3.5 - 5.1 mmol/L Final  . Chloride 03/10/2017 98* 101 - 111 mmol/L Final  . CO2 03/10/2017 25  22 - 32 mmol/L Final  . Glucose, Bld 03/10/2017 102* 65 - 99 mg/dL Final  . BUN 03/10/2017 16  6 - 20 mg/dL Final  . Creatinine, Ser 03/10/2017 0.89  0.44 - 1.00 mg/dL Final  . Calcium 03/10/2017 9.6  8.9 - 10.3 mg/dL Final  . GFR calc non Af Amer 03/10/2017 >60  >60 mL/min Final  . GFR calc Af Amer 03/10/2017 >60  >60 mL/min Final   Comment: (NOTE) The eGFR has been calculated using the CKD EPI equation. This calculation has not been validated in all clinical situations. eGFR's persistently <60 mL/min signify possible Chronic Kidney Disease.   . Anion gap 03/10/2017 8  5 - 15 Final    (this displays the last labs from the last 3 days)  No results found for: TOTALPROTELP, ALBUMINELP, A1GS, A2GS, BETS, BETA2SER, GAMS, MSPIKE, SPEI (this displays SPEP labs)  No results found for: KPAFRELGTCHN, LAMBDASER, KAPLAMBRATIO (kappa/lambda light chains)  No results found for: HGBA, HGBA2QUANT, HGBFQUANT, HGBSQUAN (Hemoglobinopathy evaluation)   No results found for: LDH  Lab Results  Component Value Date    IRON 59 12/18/2010   IRONPCTSAT 12.5 (L) 12/18/2010   (Iron and TIBC)  No results found for: FERRITIN  Urinalysis    Component Value Date/Time   COLORURINE YELLOW 06/04/2010 0845   APPEARANCEUR CLOUDY (A) 06/04/2010 0845   LABSPEC 1.011 06/04/2010 0845   PHURINE 7.5 06/04/2010 0845   GLUCOSEU NEGATIVE 06/04/2010 0845   HGBUR NEGATIVE 06/04/2010 0845   BILIRUBINUR NEGATIVE 06/04/2010 0845   KETONESUR NEGATIVE 06/04/2010 0845   PROTEINUR NEGATIVE 06/04/2010 0845   UROBILINOGEN 0.2 06/04/2010 0845   NITRITE NEGATIVE 06/04/2010 0845   LEUKOCYTESUR LARGE (A) 06/04/2010 0845     STUDIES: She will be due for mammography again in mid August 2019  ELIGIBLE FOR AVAILABLE RESEARCH PROTOCOL: no  ASSESSMENT: 77 [place].o. [place] woman status post left breast upper outer quadrant biopsy 02/09/2017 for a clinical T1b N0, stage IA invasive ductal carcinoma, grade 1, estrogen and progesterone receptor positive, HER-2 nonamplified, with an MIB-1 of 2%  (1) breast conserving surgery with sentinel lymph node sampling  03/16/2017 showed a pT1b pN0, stage IA invasive ductal carcinoma, grade 1, with negative margins (2) adjuvant radiation not anticipated  (2) adjuvant radiation not felt to be needed if patient takes antiestrogen for 5 years  (  3) Tamoxifen started 04/10/2017  (a) status post remote hysterectomy with bilateral salpingo-oophorectomy  (b) bone density at [facility] 03/30/2017 shows a T score of -1.0  PLAN: Haani is tolerating tamoxifen remarkably well and the plan will be to continue that a minimum of 5 years.  She will be seeing her surgeon in a few months.  Accordingly I will see her again late August, after her next mammogram.  I have encouraged her to exercise more.  I have also asked her to let me know next time she is planning to go to [facility] so I can give her 1 of my lucky dollars.  She will call with any other issues that may develop before the next visit here.  Amaru Burroughs,  [name], MD  07/09/17 11:01 AM Medical Oncology and Hematology [facility] [address] [place], [place] [address] Tel. [phone]    Fax. [phone]  This document serves as a record of services personally performed by Lurline Del, MD. It was created on his behalf by Sheron Nightingale, a trained medical scribe. The creation of this record is based on the scribe's personal observations and the provider's statements to them.   I have reviewed the above documentation for accuracy and completeness, and I agree with the above.

## 2017-07-09 ENCOUNTER — Telehealth: Payer: Self-pay | Admitting: Oncology

## 2017-07-09 ENCOUNTER — Inpatient Hospital Stay: Payer: MEDICARE | Attending: Oncology | Admitting: Oncology

## 2017-07-09 VITALS — BP 145/61 | HR 63 | Temp 98.4°F | Resp 20 | Wt 180.4 lb

## 2017-07-09 DIAGNOSIS — I10 Essential (primary) hypertension: Secondary | ICD-10-CM | POA: Insufficient documentation

## 2017-07-09 DIAGNOSIS — F419 Anxiety disorder, unspecified: Secondary | ICD-10-CM | POA: Diagnosis not present

## 2017-07-09 DIAGNOSIS — Z87891 Personal history of nicotine dependence: Secondary | ICD-10-CM | POA: Diagnosis not present

## 2017-07-09 DIAGNOSIS — K219 Gastro-esophageal reflux disease without esophagitis: Secondary | ICD-10-CM | POA: Insufficient documentation

## 2017-07-09 DIAGNOSIS — C50411 Malignant neoplasm of upper-outer quadrant of right female breast: Secondary | ICD-10-CM | POA: Insufficient documentation

## 2017-07-09 DIAGNOSIS — F329 Major depressive disorder, single episode, unspecified: Secondary | ICD-10-CM | POA: Insufficient documentation

## 2017-07-09 DIAGNOSIS — Z9842 Cataract extraction status, left eye: Secondary | ICD-10-CM | POA: Diagnosis not present

## 2017-07-09 DIAGNOSIS — E785 Hyperlipidemia, unspecified: Secondary | ICD-10-CM | POA: Insufficient documentation

## 2017-07-09 DIAGNOSIS — Z170 Estrogen receptor positive status [ER+]: Secondary | ICD-10-CM | POA: Insufficient documentation

## 2017-07-09 DIAGNOSIS — Z79899 Other long term (current) drug therapy: Secondary | ICD-10-CM | POA: Diagnosis not present

## 2017-07-09 DIAGNOSIS — Z90710 Acquired absence of both cervix and uterus: Secondary | ICD-10-CM | POA: Insufficient documentation

## 2017-07-09 DIAGNOSIS — E039 Hypothyroidism, unspecified: Secondary | ICD-10-CM | POA: Insufficient documentation

## 2017-07-09 DIAGNOSIS — Z96619 Presence of unspecified artificial shoulder joint: Secondary | ICD-10-CM | POA: Diagnosis not present

## 2017-07-09 DIAGNOSIS — Z79810 Long term (current) use of selective estrogen receptor modulators (SERMs): Secondary | ICD-10-CM | POA: Insufficient documentation

## 2017-07-09 MED ORDER — TAMOXIFEN CITRATE 20 MG PO TABS: 20.0000 mg | ORAL_TABLET | Freq: Every day | ORAL | 4 refills | Status: DC

## 2017-07-09 NOTE — Telephone Encounter (Signed)
Gave patient AVs and calendar of upcoming august appointments.  °

## 2017-08-04 ENCOUNTER — Ambulatory Visit (INDEPENDENT_AMBULATORY_CARE_PROVIDER_SITE_OTHER): Payer: MEDICARE | Admitting: Internal Medicine

## 2017-08-04 ENCOUNTER — Encounter: Payer: Self-pay | Admitting: Internal Medicine

## 2017-08-04 VITALS — BP 122/72 | HR 62 | Temp 98.1°F | Ht 63.0 in | Wt 179.8 lb

## 2017-08-04 DIAGNOSIS — J45909 Unspecified asthma, uncomplicated: Secondary | ICD-10-CM

## 2017-08-04 DIAGNOSIS — J0111 Acute recurrent frontal sinusitis: Secondary | ICD-10-CM

## 2017-08-04 DIAGNOSIS — J209 Acute bronchitis, unspecified: Secondary | ICD-10-CM

## 2017-08-04 DIAGNOSIS — J4520 Mild intermittent asthma, uncomplicated: Secondary | ICD-10-CM | POA: Insufficient documentation

## 2017-08-04 MED ORDER — METHYLPREDNISOLONE ACETATE 80 MG/ML IJ SUSP
80.0000 mg | Freq: Once | INTRAMUSCULAR | Status: AC
  Administered 2017-08-04: 80 mg via INTRAMUSCULAR

## 2017-08-04 MED ORDER — LEVALBUTEROL HCL 0.63 MG/3ML IN NEBU
0.6300 mg | INHALATION_SOLUTION | Freq: Once | RESPIRATORY_TRACT | Status: AC
  Administered 2017-08-04: 0.63 mg via RESPIRATORY_TRACT

## 2017-08-04 MED ORDER — AMOXICILLIN-POT CLAVULANATE 875-125 MG PO TABS: 1.0000 | ORAL_TABLET | Freq: Two times a day (BID) | ORAL | 0 refills | Status: DC

## 2017-08-04 NOTE — Progress Notes (Signed)
Subjective:    Patient ID: Brenda Stephens, female    DOB: [birth year], 77 y.o.   MRN: [id number]  HPI F former smoker followed for allergic rhinitis, complicated by HBP, GERD, Anemia, R breast cancer   ------------------------------------------------------------------------------------  01/17/15-74 yoF former smoker followed for allergic rhinitis, complicated by HBP, GERD, Anemia Reports runny nose; RT ear pops and feels full and hurts different times during day; RT side facial pressure in cheek and under eye;   08/04/17- 76 yoF former smoker followed for allergic rhinitis, complicated by HBP, GERD, Anemia, R breast cancer ACUTE VISIT: Pt is having heavy cough in chest; runny nose as well- was seen by 2 different MD's since 06-30-17-was given abx. Pt continues to have heaviness in her chest; SOB as well. Pt has had chills for some time now. Denies any wheezing. Acute bronchitis/rhinitis syndrome started around Christmas.  Treated with unknown antibiotic with partial improvement.  Mucus originally yellow brown.  Now persistent watery rhinorrhea, "heavy" feeling on her chest, dry cough.  Denies chest pain, palpitation, fever, adenopathy or rash.  Increase in chronic intermittent right frontal/right maxillary pressure headache sensation. Since last here breast cancer/right lumpectomy/tamoxifen  ROS-see HPI   + = positive Constitutional:   No-   weight loss, night sweats, fevers, chills, fatigue, lassitude. HEENT:   + headaches, difficulty swallowing, tooth/dental problems, sore throat,       No-  sneezing, itching, +ear ache, +nasal congestion, +post nasal drip,  CV:  No-   chest pain, orthopnea, PND, swelling in lower extremities, anasarca,  dizziness, palpitations Resp: No-   shortness of breath with exertion or at rest.             +  productive cough,  No non-productive cough,  No- coughing up of blood.              No-   change in color of mucus.  No- wheezing.   Skin: No-   rash or  lesions. GI:  No-   heartburn, indigestion, abdominal pain, nausea, vomiting, GU:  MS:  No-   joint pain or swelling.  Neuro-     nothing unusual Psych:  No- change in mood or affect. No depression or anxiety.  No memory loss.    Objective:  OBJ- Physical Exam General- Alert, Oriented, Affect-appropriate, Distress- none acute Skin- rash-none, lesions- none, excoriation- none Lymphadenopathy- none Head- atraumatic            Eyes- Gross vision intact, PERRLA, conjunctivae and secretions clear            Ears- Hearing ok,             Nose- White mucus R nare, no-Septal dev,  polyps, erosion, perforation             Throat- Mallampati III , mucosa clear , drainage- none, tonsils- atrophic Neck- flexible , trachea midline, no stridor , thyroid nl, carotid no bruit Chest - symmetrical excursion , unlabored           Heart/CV- RRR , no murmur , no gallop  , no rub, nl s1 s2                           - JVD- none , edema- none, stasis changes- none, varices- none           Lung- clear to P&A, wheeze- none, cough+slight , dullness-none, rub- none  Chest wall-  Abd- Br/ Gen/ Rectal- Not done, not indicated Extrem- cyanosis- none, clubbing, none, atrophy- none, strength- nl.  Neuro- grossly intact to observation Assessment & Plan:

## 2017-08-04 NOTE — Assessment & Plan Note (Signed)
Onset of sustained "heavy" sensation in chest persistent after acute respiratory infection a month ago.  Suspect sustained asthmatic bronchitis exacerbation.

## 2017-08-04 NOTE — Assessment & Plan Note (Signed)
Treating as acute frontal/maxillary recurrent sinusitis.  She is over sensitive to Sudafed. Plan-Augmentin, saline rinse

## 2017-08-04 NOTE — Patient Instructions (Signed)
Script sent for augmentin antibiotic  Neb xop 0.63    Dx asthmatic bronchitis acute  Depo 47  Order CXR    Dx Breast Ca, asthmatic bronchitis  Please call if this doesn't clear up over the next week or two

## 2017-08-05 ENCOUNTER — Ambulatory Visit (INDEPENDENT_AMBULATORY_CARE_PROVIDER_SITE_OTHER): Payer: MEDICARE | Admitting: Obstetrics & Gynecology

## 2017-08-05 ENCOUNTER — Encounter: Payer: Self-pay | Admitting: Obstetrics & Gynecology

## 2017-08-05 VITALS — BP 126/82 | Ht 62.0 in | Wt 181.4 lb

## 2017-08-05 DIAGNOSIS — Z90710 Acquired absence of both cervix and uterus: Secondary | ICD-10-CM

## 2017-08-05 DIAGNOSIS — Z170 Estrogen receptor positive status [ER+]: Secondary | ICD-10-CM

## 2017-08-05 DIAGNOSIS — C50411 Malignant neoplasm of upper-outer quadrant of right female breast: Secondary | ICD-10-CM

## 2017-08-05 DIAGNOSIS — Z90722 Acquired absence of ovaries, bilateral: Secondary | ICD-10-CM

## 2017-08-05 DIAGNOSIS — Z01411 Encounter for gynecological examination (general) (routine) with abnormal findings: Secondary | ICD-10-CM

## 2017-08-05 DIAGNOSIS — Z780 Asymptomatic menopausal state: Secondary | ICD-10-CM

## 2017-08-05 DIAGNOSIS — Z9079 Acquired absence of other genital organ(s): Secondary | ICD-10-CM

## 2017-08-05 DIAGNOSIS — Z9189 Other specified personal risk factors, not elsewhere classified: Secondary | ICD-10-CM | POA: Diagnosis not present

## 2017-08-05 NOTE — Addendum Note (Signed)
Addended by: Alen Blew on: 08/05/2017 04:31 PM   Modules accepted: Orders

## 2017-08-05 NOTE — Patient Instructions (Signed)
1. Encounter for gynecological examination with abnormal finding Gynecologic exam status post TAH/BSO.  Pap reflex done on the vaginal vault.  Breast exam, status post right lumpectomy, normal today.  Health labs with family physician.  Screening colonoscopy 2024.  2. S/P TAH-BSO  3. Menopause present Well on no hormone replacement therapy.  Vitamin D supplements, calcium rich nutrition and regular weightbearing physical activity recommended.  4. 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  Post right lumpectomy, radioactive seed and on tamoxifen.  Followed by Dr. Jana Stephens.  Brenda Stephens, it was a pleasure seeing you today!  I will inform you of your results as soon as they are available.   Health Maintenance for Postmenopausal Women Menopause is a normal process in which your reproductive ability comes to an end. This process happens gradually over a span of months to years, usually between the ages of 78 and 24. Menopause is complete when you have missed 12 consecutive menstrual periods. It is important to talk with your health care provider about some of the most common conditions that affect postmenopausal women, such as heart disease, cancer, and bone loss (osteoporosis). Adopting a healthy lifestyle and getting preventive care can help to promote your health and wellness. Those actions can also lower your chances of developing some of these common conditions. What should I know about menopause? During menopause, you may experience a number of symptoms, such as:  Moderate-to-severe hot flashes.  Night sweats.  Decrease in sex drive.  Mood swings.  Headaches.  Tiredness.  Irritability.  Memory problems.  Insomnia.  Choosing to treat or not to treat menopausal changes is an individual decision that you make with your health  care provider. What should I know about hormone replacement therapy and supplements? Hormone therapy products are effective for treating symptoms that are associated with menopause, such as hot flashes and night sweats. Hormone replacement carries certain risks, especially as you become older. If you are thinking about using estrogen or estrogen with progestin treatments, discuss the benefits and risks with your health care provider. What should I know about heart disease and stroke? Heart disease, heart attack, and stroke become more likely as you age. This may be due, in part, to the hormonal changes that your body experiences during menopause. These can affect how your body processes dietary fats, triglycerides, and cholesterol. Heart attack and stroke are both medical emergencies. There are many things that you can do to help prevent heart disease and stroke:  Have your blood pressure checked at least every 1-2 years. High blood pressure causes heart disease and increases the risk of stroke.  If you are 51-75 years old, ask your health care provider if you should take aspirin to prevent a heart attack or a stroke.  Do not use any tobacco products, including cigarettes, chewing tobacco, or electronic cigarettes. If you need help quitting, ask your health care provider.  It is important to eat a healthy diet and maintain a healthy weight. ? Be sure to include plenty of vegetables, fruits, low-fat dairy products, and lean protein. ? Avoid eating foods that are high in solid fats, added sugars, or salt (sodium).  Get regular exercise. This is one of the most important things that you can do for your health. ? Try to exercise for at least 150 minutes each week. The type of exercise that  you do should increase your heart rate and make you sweat. This is known as moderate-intensity exercise. ? Try to do strengthening exercises at least twice each week. Do these in addition to the moderate-intensity  exercise.  Know your numbers.Ask your health care provider to check your cholesterol and your blood glucose. Continue to have your blood tested as directed by your health care provider.  What should I know about cancer screening? There are several types of cancer. Take the following steps to reduce your risk and to catch any cancer development as early as possible. Breast Cancer  Practice breast self-awareness. ? This means understanding how your breasts normally appear and feel. ? It also means doing regular breast self-exams. Let your health care provider know about any changes, no matter how small.  If you are 97 or older, have a clinician do a breast exam (clinical breast exam or CBE) every year. Depending on your age, family history, and medical history, it may be recommended that you also have a yearly breast X-ray (mammogram).  If you have a family history of breast cancer, talk with your health care provider about genetic screening.  If you are at high risk for breast cancer, talk with your health care provider about having an MRI and a mammogram every year.  Breast cancer (BRCA) gene test is recommended for women who have family members with BRCA-related cancers. Results of the assessment will determine the need for genetic counseling and BRCA1 and for BRCA2 testing. BRCA-related cancers include these types: ? Breast. This occurs in males or females. ? Ovarian. ? Tubal. This may also be called fallopian tube cancer. ? Cancer of the abdominal or pelvic lining (peritoneal cancer). ? Prostate. ? Pancreatic.  Cervical, Uterine, and Ovarian Cancer Your health care provider may recommend that you be screened regularly for cancer of the pelvic organs. These include your ovaries, uterus, and vagina. This screening involves a pelvic exam, which includes checking for microscopic changes to the surface of your cervix (Pap test).  For women ages 21-65, health care providers may recommend a  pelvic exam and a Pap test every three years. For women ages 17-65, they may recommend the Pap test and pelvic exam, combined with testing for human papilloma virus (HPV), every five years. Some types of HPV increase your risk of cervical cancer. Testing for HPV may also be done on women of any age who have unclear Pap test results.  Other health care providers may not recommend any screening for nonpregnant women who are considered low risk for pelvic cancer and have no symptoms. Ask your health care provider if a screening pelvic exam is right for you.  If you have had past treatment for cervical cancer or a condition that could lead to cancer, you need Pap tests and screening for cancer for at least 20 years after your treatment. If Pap tests have been discontinued for you, your risk factors (such as having a new sexual partner) need to be reassessed to determine if you should start having screenings again. Some women have medical problems that increase the chance of getting cervical cancer. In these cases, your health care provider may recommend that you have screening and Pap tests more often.  If you have a family history of uterine cancer or ovarian cancer, talk with your health care provider about genetic screening.  If you have vaginal bleeding after reaching menopause, tell your health care provider.  There are currently no reliable tests available to screen  for ovarian cancer.  Lung Cancer Lung cancer screening is recommended for adults 39-51 years old who are at high risk for lung cancer because of a history of smoking. A yearly low-dose CT scan of the lungs is recommended if you:  Currently smoke.  Have a history of at least 30 pack-years of smoking and you currently smoke or have quit within the past 15 years. A pack-year is smoking an average of one pack of cigarettes per day for one year.  Yearly screening should:  Continue until it has been 15 years since you quit.  Stop if  you develop a health problem that would prevent you from having lung cancer treatment.  Colorectal Cancer  This type of cancer can be detected and can often be prevented.  Routine colorectal cancer screening usually begins at age 81 and continues through age 9.  If you have risk factors for colon cancer, your health care provider may recommend that you be screened at an earlier age.  If you have a family history of colorectal cancer, talk with your health care provider about genetic screening.  Your health care provider may also recommend using home test kits to check for hidden blood in your stool.  A small camera at the end of a tube can be used to examine your colon directly (sigmoidoscopy or colonoscopy). This is done to check for the earliest forms of colorectal cancer.  Direct examination of the colon should be repeated every 5-10 years until age 12. However, if early forms of precancerous polyps or small growths are found or if you have a family history or genetic risk for colorectal cancer, you may need to be screened more often.  Skin Cancer  Check your skin from head to toe regularly.  Monitor any moles. Be sure to tell your health care provider: ? About any new moles or changes in moles, especially if there is a change in a mole's shape or color. ? If you have a mole that is larger than the size of a pencil eraser.  If any of your family members has a history of skin cancer, especially at a young age, talk with your health care provider about genetic screening.  Always use sunscreen. Apply sunscreen liberally and repeatedly throughout the day.  Whenever you are outside, protect yourself by wearing long sleeves, pants, a wide-brimmed hat, and sunglasses.  What should I know about osteoporosis? Osteoporosis is a condition in which bone destruction happens more quickly than new bone creation. After menopause, you may be at an increased risk for osteoporosis. To help prevent  osteoporosis or the bone fractures that can happen because of osteoporosis, the following is recommended:  If you are 75-62 years old, get at least 1,000 mg of calcium and at least 600 mg of vitamin D per day.  If you are older than age 84 but younger than age 79, get at least 1,200 mg of calcium and at least 600 mg of vitamin D per day.  If you are older than age 64, get at least 1,200 mg of calcium and at least 800 mg of vitamin D per day.  Smoking and excessive alcohol intake increase the risk of osteoporosis. Eat foods that are rich in calcium and vitamin D, and do weight-bearing exercises several times each week as directed by your health care provider. What should I know about how menopause affects my mental health? Depression may occur at any age, but it is more common as you become  older. Common symptoms of depression include:  Low or sad mood.  Changes in sleep patterns.  Changes in appetite or eating patterns.  Feeling an overall lack of motivation or enjoyment of activities that you previously enjoyed.  Frequent crying spells.  Talk with your health care provider if you think that you are experiencing depression. What should I know about immunizations? It is important that you get and maintain your immunizations. These include:  Tetanus, diphtheria, and pertussis (Tdap) booster vaccine.  Influenza every year before the flu season begins.  Pneumonia vaccine.  Shingles vaccine.  Your health care provider may also recommend other immunizations. This information is not intended to replace advice given to you by your health care provider. Make sure you discuss any questions you have with your health care provider. Document Released: 08/08/2005 Document Revised: 01/04/2016 Document Reviewed: 03/20/2015 Elsevier Interactive Patient Education  2018 [employer].

## 2017-08-05 NOTE — Progress Notes (Signed)
Brenda Stephens 1941/04/25 [id number]   History:    77 y.o. G1P1L1 Married.  Daughter is single, no children.  RP:  Established patient presenting for annual gyn exam   HPI:  S/P TAH/BSO.  Menopause, no HRT.  No pelvic pain.  No pain with IC.  Urine/BMs wnl. Diagnosis of a Right breast invasive ductal Ca, grade 1 (ER and PR positive) on 02/09/2017.  Had a Rt Breast Lumpectomy/[place] Node with Radioactive seed 03/16/2017, Stage 1.  On Tamoxifen.  Followed by Dr Jana Hakim.    Past medical history,surgical history, family history and social history were all reviewed and documented in the EPIC chart.  Gynecologic History No LMP recorded. Patient has had a hysterectomy. Contraception: status post hysterectomy Last Pap: 2012. Results were: normal Last mammogram: 01/2017. Results were: Abnormal.  Bx Rt Invasive ductal Ca. Bone Density: 03/2017 Low normal with T-Score -1.0 Colonoscopy: Due 2024  Obstetric History OB History  Gravida Para Term Preterm AB Living  1 1       1   SAB TAB Ectopic Multiple Live Births               # Outcome Date GA Lbr Len/2nd Weight Sex Delivery Anes PTL Lv  1 Para                ROS: A ROS was performed and pertinent positives and negatives are included in the history.  GENERAL: No fevers or chills. HEENT: No change in vision, no earache, sore throat or sinus congestion. NECK: No pain or stiffness. CARDIOVASCULAR: No chest pain or pressure. No palpitations. PULMONARY: No shortness of breath, cough or wheeze. GASTROINTESTINAL: No abdominal pain, nausea, vomiting or diarrhea, melena or bright red blood per rectum. GENITOURINARY: No urinary frequency, urgency, hesitancy or dysuria. MUSCULOSKELETAL: No joint or muscle pain, no back pain, no recent trauma. DERMATOLOGIC: No rash, no itching, no lesions. ENDOCRINE: No polyuria, polydipsia, no heat or cold intolerance. No recent change in weight. HEMATOLOGICAL: No anemia or easy bruising or bleeding. NEUROLOGIC: No  headache, seizures, numbness, tingling or weakness. PSYCHIATRIC: No depression, no loss of interest in normal activity or change in sleep pattern.     Exam:   There were no vitals taken for this visit.  There is no height or weight on file to calculate BMI.  General appearance : Well developed well nourished female. No acute distress HEENT: Eyes: no retinal hemorrhage or exudates,  Neck supple, trachea midline, no carotid bruits, no thyroidmegaly Lungs: Clear to auscultation, no rhonchi or wheezes, or rib retractions  Heart: Regular rate and rhythm, no murmurs or gallops Breast:Examined in sitting and supine position were symmetrical in appearance, no palpable masses or tenderness,  no skin retraction, no nipple inversion, no nipple discharge, no skin discoloration, no axillary or supraclavicular lymphadenopathy Abdomen: no palpable masses or tenderness, no rebound or guarding Extremities: no edema or skin discoloration or tenderness  Pelvic: Vulva: Normal             Vagina: No gross lesions or discharge.  Pap reflex done.  Cervix/Uterus absent  Adnexa  Without masses or tenderness  Anus: Non-thrombosed external hemorrhoids   Assessment/Plan:  77 y.o. female for annual exam   1. Encounter for gynecological examination with abnormal finding Gynecologic exam status post TAH/BSO.  Pap reflex done on the vaginal vault.  Breast exam, status post right lumpectomy, normal today.  Health labs with family physician.  Screening colonoscopy 2024.  2. S/P TAH-BSO  3. Menopause  present Well on no hormone replacement therapy.  Vitamin D supplements, calcium rich nutrition and regular weightbearing physical activity recommended.  4. 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  Post right lumpectomy, radioactive seed and on tamoxifen.   Followed by Dr. Jana Hakim.  Princess Bruins MD, 3:09 PM 08/05/2017

## 2017-08-11 LAB — PAP IG W/ RFLX HPV ASCU

## 2017-09-14 ENCOUNTER — Ambulatory Visit: Payer: MEDICARE | Admitting: Acute Care

## 2017-12-15 ENCOUNTER — Telehealth: Payer: Self-pay | Admitting: Cardiology

## 2017-12-15 NOTE — Telephone Encounter (Signed)
[id number] 

## 2017-12-15 NOTE — Telephone Encounter (Signed)
New Message    Pt c/o swelling: STAT is pt has developed SOB within 24 hours  1) How much weight have you gained and in what time span? 15lbs in a year   2) If swelling, where is the swelling located? Ankles   3) Are you currently taking a fluid pill? yes  4) Are you currently SOB? no  5) Do you have a log of your daily weights (if so, list)? no  Have you gained 3 pounds in a day or 5 pounds in a week? no 6) Have you traveled recently? Has flown several times this month

## 2017-12-16 NOTE — Progress Notes (Signed)
Cardiology Office Note   Date:  12/17/2017   ID:  Chrystal, Zeimet [birth year], MRN [id number]  PCP:  Dyann Ruddle, MD  Cardiologist:   [name], MD   Chief Complaint  Patient presents with  . Edema  . Chest Pain      History of Present Illness: Brenda Stephens is a 77 y.o. female who presents for evaluation of palpitations.  I saw her in 2017 for these.  She comes back today because she is had increased lower extremity swelling.  She said about a 20 pound weight gain because she is been.  Her husband thinks she is been depressed since being treated for breast cancer.  She noted swelling after they did quite a bit of traveling.  They went to [place] in [place].  They have been eating out a lot.  There is a lot of flying.  She had increased lower extremity swelling.  She denies any PND or orthopnea.  She has noticed that she is been getting some chest discomfort when she walks upstairs.  She feels heavy in her chest for a few minutes.  She does not have jaw or arm discomfort.  She does not have any resting symptoms.  She does not describe nausea vomiting or excessive diaphoresis.  She is not having any palpitations, presyncope or syncope.    Past Medical History:  Diagnosis Date  . Allergy    latex  . Anemia   . Anxiety   . Arthritis   . Cancer ([place]) 01/2017   right breast cancer  . Depression   . Dyslipidemia   . Eustachian tube dysfunction   . GERD (gastroesophageal reflux disease)   . Hx of cardiovascular stress test    Myoview 10/13:  apical thinning, EF 72%, no ischemia  . Hx of colonic polyps    Dr Watt Climes  . Hypertension   . Hypothyroidism   . Other abnormal glucose     Past Surgical History:  Procedure Laterality Date  . ABDOMINAL HYSTERECTOMY     BSO for uterine fibroids  . ACHILLES TENDON SURGERY Right 10/07/2012   Procedure: RIGHT REPAIR RUPTURE TENDON PRIMARY OPEN/PERCUTANEOUS ;  Surgeon: Ninetta Lights, MD;  Location: [facility];  Service: Orthopedics;  Laterality: Right;  . APPENDECTOMY    . BREAST LUMPECTOMY WITH RADIOACTIVE SEED AND SENTINEL LYMPH NODE BIOPSY Right 03/16/2017   Procedure: RIGHT BREAST LUMPECTOMY WITH RADIOACTIVE SEED AND RIGHT AXILLARY SENTINEL  NODE BIOPSY ERAS PATHWAY;  Surgeon: [name], MD;  Location: [facility];  Service: General;  Laterality: Right;  PECTORAL BLOCK  . CATARACT EXTRACTION, BILATERAL    . CHOLECYSTECTOMY    . COLONOSCOPY W/ POLYPECTOMY     Dr Watt Climes  . EAR CYST EXCISION Right 10/07/2012   Procedure: EXCISION BONE CYST BENIGN TUMOR [name] ;  Surgeon: Ninetta Lights, MD;  Location: [facility];  Service: Orthopedics;  Laterality: Right;  . EXPLORATORY LAPAROTOMY    . SINUS SURGERY WITH INSTATRAK    . TOTAL SHOULDER REPLACEMENT     Right shoulder  . 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 TUBAL LIGATION       Current Outpatient Medications  Medication Sig Dispense Refill  . atorvastatin (LIPITOR) 20 MG tablet TAKE 1 TABLET BY MOUTH AT BEDTIME 90 tablet 1  . dicyclomine (BENTYL) 10 MG capsule Take 10 mg by mouth daily.     Marland Kitchen esomeprazole (  NEXIUM 24HR) 20 MG capsule Take 20 mg by mouth daily at 12 noon.    . meloxicam (MOBIC) 15 MG tablet Take 15 mg by mouth daily.    . metoprolol succinate (TOPROL-XL) 50 MG 24 hr tablet Take 1 tablet (50 mg total) by mouth daily. 90 tablet 3  . Saline (AYR SALINE NASAL DROPS) 0.65 % (SOLN) SOLN Place 1 spray into the nose 3 (three) times daily.    Marland Kitchen spironolactone-hydrochlorothiazide (ALDACTAZIDE) 25-25 MG per tablet Take 1 tablet by mouth daily. 90 tablet 3  . SYNTHROID 50 MCG tablet PT NEEDS COMPLETE PHYSICAL.  TAKE 1 TABLET (50MCG TOTAL) BY MOUTH DAILY. 30 tablet 0  . tamoxifen (NOLVADEX) 20 MG tablet Take 1 tablet (20 mg total) by mouth daily. 90 tablet 4  . tamsulosin (FLOMAX) 0.4 MG CAPS capsule Take 0.4 mg by mouth daily after breakfast.    . venlafaxine XR (EFFEXOR-XR) 150  MG 24 hr capsule TAKE 1 CAPSULE (150 MG TOTAL) BY MOUTH DAILY. 90 capsule 2  . verapamil (CALAN-SR) 240 MG CR tablet TAKE 1 TABLET (240 MG TOTAL) BY MOUTH AT BEDTIME. 30 tablet 0  . zolpidem (AMBIEN) 10 MG tablet Take 5 mg by mouth at bedtime.     No current facility-administered medications for this visit.     Allergies:   Latex; Aspirin; Oxycodone-acetaminophen; Oxycodone-acetaminophen; Sulfa antibiotics; and Sulfamethoxazole-trimethoprim    ROS:  Please see the history of present illness.   Otherwise, review of systems are positive for none.   All other systems are reviewed and negative.    PHYSICAL EXAM: VS:  BP 130/82   Pulse 84   Ht 5\' 3"  (1.6 m)   Wt 185 lb 12.8 oz (84.3 kg)   BMI 32.91 kg/m  , BMI Body mass index is 32.91 kg/m.  GENERAL:  Well appearing NECK:  No jugular venous distention, waveform within normal limits, carotid upstroke brisk and symmetric, no bruits, no thyromegaly LUNGS:  Clear to auscultation bilaterally CHEST:  Unremarkable HEART:  PMI not displaced or sustained,S1 and S2 within normal limits, no S3, no S4, no clicks, no rubs, no murmurs ABD:  Flat, positive bowel sounds normal in frequency in pitch, no bruits, no rebound, no guarding, no midline pulsatile mass, no hepatomegaly, no splenomegaly EXT:  2 plus pulses throughout, mild edema, no cyanosis no clubbing   EKG:  EKG is not  ordered today.   Recent Labs: 03/13/2017: ALT 18; BUN 14.8; Creatinine 0.8; HGB 13.1; Platelets 236; Potassium 4.3; Sodium 132    Lipid Panel    Component Value Date/Time   CHOL 208 (H) 07/19/2012 1520   TRIG 152.0 (H) 07/19/2012 1520   HDL 70.30 07/19/2012 1520   CHOLHDL 3 07/19/2012 1520   VLDL 30.4 07/19/2012 1520   LDLCALC 91 06/13/2011 1134   LDLDIRECT 108.4 07/19/2012 1520      Wt Readings from Last 3 Encounters:  12/17/17 185 lb 12.8 oz (84.3 kg)  08/05/17 181 lb 6.4 oz (82.3 kg)  08/04/17 179 lb 12.8 oz (81.6 kg)      Other studies  Reviewed: Additional studies/ records that were reviewed today include: None. Review of the above records demonstrates:     ASSESSMENT AND PLAN:  EDEMA:   She has mild leg edema that is probably related to travel, salt, weight gain.  I am going to start a BNP level and consider further testing based on the results of this and the stress test below.  CHEST PAIN:   She  has some vague chest discomfort.  She does have risk factors.  I would like to bring her back and do a stress test.  I do note that she did not get to target heart rate when she did a POET (Plain Old Exercise Treadmill)  in the distant past.  Therefore, this time I will proceed directly to a Lexiscan Myoview.  Current medicines are reviewed at length with the patient today.  The patient does not have concerns regarding medicines.  The following changes have been made:  None  Labs/ tests ordered today include: As above.  Orders Placed This Encounter  Procedures  . B Nat Peptide  . MYOCARDIAL PERFUSION IMAGING     Disposition:   FU with me after the above studies.     Signed, [name], MD  12/17/2017 5:34 PM    [facility]

## 2017-12-17 ENCOUNTER — Ambulatory Visit (INDEPENDENT_AMBULATORY_CARE_PROVIDER_SITE_OTHER): Payer: MEDICARE | Admitting: Cardiology

## 2017-12-17 ENCOUNTER — Encounter: Payer: Self-pay | Admitting: Cardiology

## 2017-12-17 VITALS — BP 130/82 | HR 84 | Ht 63.0 in | Wt 185.8 lb

## 2017-12-17 DIAGNOSIS — M7989 Other specified soft tissue disorders: Secondary | ICD-10-CM | POA: Diagnosis not present

## 2017-12-17 DIAGNOSIS — R072 Precordial pain: Secondary | ICD-10-CM | POA: Diagnosis not present

## 2017-12-17 NOTE — Telephone Encounter (Signed)
Patient has MD OV 12/17/17

## 2017-12-17 NOTE — Patient Instructions (Addendum)
Medication Instructions:  Continue current medications  If you need a refill on your cardiac medications before your next appointment, please call your pharmacy.  Labwork: BNP  Testing/Procedures: Your physician has requested that you have a lexiscan myoview. For further information please visit [email or web address]. Please follow instruction sheet, as given.  Follow-Up: Your physician wants you to follow-up in: [place] on results.     Thank you for choosing [facility] at [facility]!!

## 2017-12-18 ENCOUNTER — Telehealth: Payer: Self-pay | Admitting: Cardiology

## 2017-12-18 NOTE — Telephone Encounter (Signed)
Called patient and LVM to call back to schedule stress test.

## 2017-12-24 ENCOUNTER — Telehealth (HOSPITAL_COMMUNITY): Payer: Self-pay

## 2017-12-24 NOTE — Telephone Encounter (Signed)
Encounter complete. 

## 2017-12-28 ENCOUNTER — Telehealth (HOSPITAL_COMMUNITY): Payer: Self-pay | Admitting: *Deleted

## 2017-12-28 NOTE — Telephone Encounter (Signed)
Patient stated she did not receive a phone call or a voicemail in reference to her appointment on 12/29/17.Patient given detailed instructions per Myocardial Perfusion Study Information Sheet for the test on 12/29/17 at 0730. Patient notified to arrive 15 minutes early and that it is imperative to arrive on time for appointment to keep from having the test rescheduled.  If you need to cancel or reschedule your appointment, please call the office within 24 hours of your appointment. Patient verbalized understanding. Dishon Kehoe, Ranae Palms

## 2017-12-29 ENCOUNTER — Ambulatory Visit (HOSPITAL_COMMUNITY)
Admission: RE | Admit: 2017-12-29 | Discharge: 2017-12-29 | Disposition: A | Discharge status: Home / Self Care | Payer: MEDICARE | Source: Ambulatory Visit | Attending: Cardiovascular Disease | Admitting: Cardiovascular Disease

## 2017-12-29 DIAGNOSIS — F329 Major depressive disorder, single episode, unspecified: Secondary | ICD-10-CM | POA: Diagnosis not present

## 2017-12-29 DIAGNOSIS — R9439 Abnormal result of other cardiovascular function study: Secondary | ICD-10-CM | POA: Diagnosis not present

## 2017-12-29 DIAGNOSIS — F419 Anxiety disorder, unspecified: Secondary | ICD-10-CM | POA: Diagnosis not present

## 2017-12-29 DIAGNOSIS — Z87891 Personal history of nicotine dependence: Secondary | ICD-10-CM | POA: Diagnosis not present

## 2017-12-29 DIAGNOSIS — R079 Chest pain, unspecified: Secondary | ICD-10-CM | POA: Insufficient documentation

## 2017-12-29 DIAGNOSIS — R002 Palpitations: Secondary | ICD-10-CM | POA: Diagnosis not present

## 2017-12-29 DIAGNOSIS — K219 Gastro-esophageal reflux disease without esophagitis: Secondary | ICD-10-CM | POA: Insufficient documentation

## 2017-12-29 DIAGNOSIS — I10 Essential (primary) hypertension: Secondary | ICD-10-CM | POA: Diagnosis not present

## 2017-12-29 DIAGNOSIS — J45909 Unspecified asthma, uncomplicated: Secondary | ICD-10-CM | POA: Diagnosis not present

## 2017-12-29 DIAGNOSIS — E669 Obesity, unspecified: Secondary | ICD-10-CM | POA: Insufficient documentation

## 2017-12-29 DIAGNOSIS — R0609 Other forms of dyspnea: Secondary | ICD-10-CM | POA: Insufficient documentation

## 2017-12-29 DIAGNOSIS — Z6832 Body mass index (BMI) 32.0-32.9, adult: Secondary | ICD-10-CM | POA: Diagnosis not present

## 2017-12-29 DIAGNOSIS — R42 Dizziness and giddiness: Secondary | ICD-10-CM | POA: Diagnosis not present

## 2017-12-29 DIAGNOSIS — M7989 Other specified soft tissue disorders: Secondary | ICD-10-CM | POA: Diagnosis present

## 2017-12-29 DIAGNOSIS — Z8249 Family history of ischemic heart disease and other diseases of the circulatory system: Secondary | ICD-10-CM | POA: Insufficient documentation

## 2017-12-29 LAB — MYOCARDIAL PERFUSION IMAGING
CHL CUP RESTING HR STRESS: 55 {beats}/min
LVDIAVOL: 72 mL (ref 46–106)
LVSYSVOL: 21 mL
NUC STRESS TID: 1.33
Peak HR: 73 {beats}/min
SDS: 0
SRS: 6
SSS: 6

## 2017-12-29 MED ORDER — TECHNETIUM TC 99M TETROFOSMIN IV KIT
26.6000 | PACK | Freq: Once | INTRAVENOUS | Status: AC | PRN
  Administered 2017-12-29: 26.6 via INTRAVENOUS
  Filled 2017-12-29: qty 27

## 2017-12-29 MED ORDER — TECHNETIUM TC 99M TETROFOSMIN IV KIT
8.1000 | PACK | Freq: Once | INTRAVENOUS | Status: AC | PRN
  Administered 2017-12-29: 8.1 via INTRAVENOUS
  Filled 2017-12-29: qty 9

## 2017-12-29 MED ORDER — REGADENOSON 0.4 MG/5ML IV SOLN
0.4000 mg | Freq: Once | INTRAVENOUS | Status: AC
  Administered 2017-12-29: 0.4 mg via INTRAVENOUS

## 2017-12-30 NOTE — Progress Notes (Signed)
Myoview Stress Test results:  LOW RISK STUDY  ECG is normal & images look good.  No sign of large Vessel ischemia or infarction.   Small area of what looks to be artifact seen on both rest & stress images -- (would be artifact or prior heart attack, but normal wall motion rules out prior heart attack) Normal function.  Leonie Man, MD For Dr. Percival Spanish  pls fwd to PCP: Dyann Ruddle, MD

## 2018-02-01 NOTE — Progress Notes (Signed)
[place]  Telephone:(336) [phone] Fax:(336) [phone]     ID: Brenda Brenda DOB: 09/05/1940  MR#: [id number]  Brenda Brenda  Patient Care Team: Dyann Ruddle, MD as PCP - General (Internal Medicine) [name], MD as PCP - Cardiology (Cardiology) [name], [name], MD as Consulting Physician (Oncology) [name], MD as Consulting Physician (General Surgery) [name], MD as Consulting Physician (Gastroenterology) OTHER MD:  CHIEF COMPLAINT: Estrogen receptor positive breast cancer  CURRENT TREATMENT: Tamoxifen   HISTORY OF CURRENT ILLNESS: From the original intake note:  Brenda Brenda had routine bilateral screening mammography at [facility] 01/28/2017. An irregular lesion in the right breast upper outer quadrant was detected and she was recalled for right diagnostic mammography and ultrasonography 02/05/2017. This found the breast density to be category B. In the right breast upper quadrant there was an irregular mass which by ultrasound measured 0.8 cm. It was located at the 11:00 radiant posteriorly. The right axilla was sonographically benign.  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  The patient's subsequent history is as detailed below.  INTERVAL HISTORY: Brenda Brenda returns today for follow-up and treatment of her estrogen receptor positive breast cancer.  She is accompanied by her husband today. [Recall that the Brenda of my patient Brenda Brenda. works for him.]  Brenda Brenda continues on tamoxifen, with good tolerance. She denies hot flashes, however, she notes that she has an increase in vaginal wetness which is a nuisance, but not bad enough for her to wear a pad.  Since her last  visit to the office, she had a bilateral screening mammogram completed at [facility] on 02/03/2018 and per patient, the results were "good".     REVIEW OF SYSTEMS: Brenda Brenda reports that for exercise, she has been doing swim aerobics 3 days a week. She has a recumbent bicycle at home which she uses occasionally. 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 She has also been reading fiction lately, which husband notes that she reads up to 3 books a week. She denies unusual headaches, visual changes, nausea, vomiting, or dizziness. There has been no unusual cough, phlegm production, or pleurisy. This been no change in bowel or bladder habits. She denies unexplained fatigue or unexplained weight loss, bleeding, rash, or fever. A detailed review of systems was otherwise stable.      PAST MEDICAL HISTORY: Past Medical History:  Diagnosis Date  . Allergy    latex  . Anemia   . Anxiety   . Arthritis   . Cancer ([place]) 01/2017   right breast cancer  . Depression   . Dyslipidemia   . Eustachian tube dysfunction   . GERD (gastroesophageal reflux disease)   . Hx of cardiovascular stress test    Myoview 10/13:  apical thinning, EF 72%, no ischemia  . Hx of colonic polyps    Dr Watt Climes  . Hypertension   . Hypothyroidism   . Other abnormal glucose     PAST SURGICAL HISTORY: Past Surgical History:  Procedure Laterality Date  . ABDOMINAL HYSTERECTOMY     BSO for uterine fibroids  . ACHILLES TENDON SURGERY Right 10/07/2012   Procedure: RIGHT REPAIR RUPTURE TENDON PRIMARY OPEN/PERCUTANEOUS ;  Surgeon: Ninetta Lights, MD;  Location: [facility];  Service: Orthopedics;  Laterality: Right;  . APPENDECTOMY    . BREAST LUMPECTOMY WITH RADIOACTIVE SEED AND SENTINEL LYMPH NODE BIOPSY Right 03/16/2017   Procedure: RIGHT BREAST LUMPECTOMY WITH RADIOACTIVE SEED AND RIGHT AXILLARY SENTINEL  NODE BIOPSY ERAS PATHWAY;  Surgeon: [name], MD;  Location: [facility];  Service: General;  Laterality: Right;  PECTORAL BLOCK  . CATARACT EXTRACTION, BILATERAL    . CHOLECYSTECTOMY    . COLONOSCOPY W/ POLYPECTOMY     Dr Watt Climes  . EAR CYST EXCISION Right 10/07/2012   Procedure: EXCISION BONE CYST BENIGN TUMOR [name] ;  Surgeon: Ninetta Lights, MD;  Location: [facility];  Service: Orthopedics;  Laterality: Right;  . EXPLORATORY LAPAROTOMY    . SINUS SURGERY WITH INSTATRAK    . TOTAL SHOULDER REPLACEMENT     Right shoulder  . 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 TUBAL LIGATION      FAMILY HISTORY Family History  Problem Relation Age of Onset  . Hiatal hernia Mother   . Heart attack Mother 55  . Heart disease Father        CAD @ autopsy  . Cancer Father        renal cancer  . Heart attack Brother 24       fatal  . Diabetes Neg Hx   . Stroke Neg Hx   The patient's father died from kidney cancer at age 64. The patient's mother died from a heart attack at age 68. The patient had one brother, who died from a heart attack. She had no sisters. There is no history of breast or ovarian cancer in the family.  GYNECOLOGIC HISTORY:  No LMP recorded. Patient has had a hysterectomy. Menarche age 43, first live birth age 75, the patient is GX P1. She underwent total abdominal hysterectomy with bilateral salpingo-oophorectomy remotely. She used hormone replacement between 5 and 10 years.  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 Her husband was Mudlogger of [place] for railroad company. He is retired but currently overseas the [employer] property as a Freight forwarder. 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 The patient has no grandchildren. She is not a church attender    ADVANCED DIRECTIVES: Her husband is her healthcare power of attorney per [employer] MAINTENANCE: Social History   Tobacco Use  . Smoking status:  Former Smoker    Packs/day: 0.50    Years: 6.00    Pack years: 3.00    Types: Cigarettes    Last attempt to quit: 06/30/1962    Years since quitting: 55.6  . Smokeless tobacco: Never Used  . Tobacco comment: Quit age 46  Substance Use Topics  . Alcohol use: Yes    Alcohol/week: 3.0 standard drinks    Types: 3 Glasses of wine per week    Comment: wine  : < 3 glasses weekly  . Drug use: No     Colonoscopy:May got  PAP: Status post hysterectomy  Bone density: Due/[facility]   Allergies  Allergen Reactions  . Latex     rash rash  . Aspirin     GI bleeding  . Oxycodone-Acetaminophen     nausea  . Oxycodone-Acetaminophen     nausea  . Sulfa Antibiotics     Other reaction(s): Other (See Comments), Unknown  . Sulfamethoxazole-Trimethoprim  Nausea     Current Outpatient Medications  Medication Sig Dispense Refill  . atorvastatin (LIPITOR) 20 MG tablet TAKE 1 TABLET BY MOUTH AT BEDTIME 90 tablet 1  . dicyclomine (BENTYL) 10 MG capsule Take 10 mg by mouth daily.     Marland Kitchen esomeprazole (NEXIUM 24HR) 20 MG capsule Take 20 mg by mouth daily at 12 noon.    . meloxicam (MOBIC) 15 MG tablet Take 15 mg by mouth daily.    . metoprolol succinate (TOPROL-XL) 50 MG 24 hr tablet Take 1 tablet (50 mg total) by mouth daily. 90 tablet 3  . Saline (AYR SALINE NASAL DROPS) 0.65 % (SOLN) SOLN Place 1 spray into the nose 3 (three) times daily.    Marland Kitchen spironolactone-hydrochlorothiazide (ALDACTAZIDE) 25-25 MG per tablet Take 1 tablet by mouth daily. 90 tablet 3  . SYNTHROID 50 MCG tablet PT NEEDS COMPLETE PHYSICAL.  TAKE 1 TABLET (50MCG TOTAL) BY MOUTH DAILY. 30 tablet 0  . tamoxifen (NOLVADEX) 20 MG tablet Take 1 tablet (20 mg total) by mouth daily. 90 tablet 4  . tamsulosin (FLOMAX) 0.4 MG CAPS capsule Take 0.4 mg by mouth daily after breakfast.    . venlafaxine XR (EFFEXOR-XR) 150 MG 24 hr capsule TAKE 1 CAPSULE (150 MG TOTAL) BY MOUTH DAILY. 90 capsule 2  . verapamil (CALAN-SR) 240 MG CR tablet TAKE 1  TABLET (240 MG TOTAL) BY MOUTH AT BEDTIME. 30 tablet 0  . zolpidem (AMBIEN) 10 MG tablet Take 5 mg by mouth at bedtime.     No current facility-administered medications for this visit.    ppears well  OBJECTIVE: Older white woman who appears well  Vitals:   02/04/18 1034  BP: (!) 150/59  Pulse: 69  Resp: 18  Temp: (!) 97.5 F (36.4 C)  SpO2: 99%     Body mass index is 32.77 kg/m.   Wt Readings from Last 3 Encounters:  12/29/17 185 lb (83.9 kg)  12/17/17 185 lb 12.8 oz (84.3 kg)  08/05/17 181 lb 6.4 oz (82.3 kg)      ECOG FS:1 - Symptomatic but completely ambulatory  Sclerae unicteric, EOMs intact Oropharynx clear and moist No cervical or supraclavicular adenopathy Lungs no rales or rhonchi Heart regular rate and rhythm Abd soft, nontender, positive bowel sounds MSK no focal spinal tenderness, no upper extremity lymphedema Neuro: nonfocal, well oriented, appropriate affect Breasts: Status post right lumpectomy (no radiation).  There is no evidence of local recurrence.  The left breast is benign.  Both axillae are benign.  LAB RESULTS:  CMP     Component Value Date/Time   NA 131 (L) 02/04/2018 1014   NA 132 (L) 03/13/2017 1252   K 4.1 02/04/2018 1014   K 4.3 03/13/2017 1252   CL 98 02/04/2018 1014   CO2 22 02/04/2018 1014   CO2 26 03/13/2017 1252   GLUCOSE 97 02/04/2018 1014   GLUCOSE 107 03/13/2017 1252   BUN 12 02/04/2018 1014   BUN 14.8 03/13/2017 1252   CREATININE 0.94 02/04/2018 1014   CREATININE 0.8 03/13/2017 1252   CALCIUM 8.8 (L) 02/04/2018 1014   CALCIUM 10.0 03/13/2017 1252   PROT 6.5 02/04/2018 1014   PROT 7.2 03/13/2017 1252   ALBUMIN 3.6 02/04/2018 1014   ALBUMIN 4.0 03/13/2017 1252   AST 18 02/04/2018 1014   AST 18 03/13/2017 1252   ALT 20 02/04/2018 1014   ALT 18 03/13/2017 1252   ALKPHOS 45 02/04/2018 1014   ALKPHOS 71 03/13/2017 1252   BILITOT  0.6 02/04/2018 1014   BILITOT 0.54 03/13/2017 1252   GFRNONAA 57 (L) 02/04/2018 1014    GFRAA >60 02/04/2018 1014    No results found for: TOTALPROTELP, ALBUMINELP, A1GS, A2GS, BETS, BETA2SER, GAMS, MSPIKE, SPEI  No results found for: Nils Pyle, [facility]  Lab Results  Component Value Date   WBC 5.9 02/04/2018   NEUTROABS 3.7 02/04/2018   HGB 12.1 02/04/2018   HCT 36.6 02/04/2018   MCV 93.1 02/04/2018   PLT 219 02/04/2018      Chemistry      Component Value Date/Time   NA 131 (L) 02/04/2018 1014   NA 132 (L) 03/13/2017 1252   K 4.1 02/04/2018 1014   K 4.3 03/13/2017 1252   CL 98 02/04/2018 1014   CO2 22 02/04/2018 1014   CO2 26 03/13/2017 1252   BUN 12 02/04/2018 1014   BUN 14.8 03/13/2017 1252   CREATININE 0.94 02/04/2018 1014   CREATININE 0.8 03/13/2017 1252      Component Value Date/Time   CALCIUM 8.8 (L) 02/04/2018 1014   CALCIUM 10.0 03/13/2017 1252   ALKPHOS 45 02/04/2018 1014   ALKPHOS 71 03/13/2017 1252   AST 18 02/04/2018 1014   AST 18 03/13/2017 1252   ALT 20 02/04/2018 1014   ALT 18 03/13/2017 1252   BILITOT 0.6 02/04/2018 1014   BILITOT 0.54 03/13/2017 1252       No results found for: LABCA2  No components found for: [id number]  No results for input(s): INR in the last 168 hours.  No results found for: LABCA2  No results found for: MCN470  No results found for: JGG836  No results found for: OQH476  No results found for: CA2729  No components found for: HGQUANT  No results found for: CEA1 / No results found for: CEA1   No results found for: AFPTUMOR  No results found for: CHROMOGRNA  No results found for: PSA1  Appointment on 02/04/2018  Component Date Value Ref Range Status  . Sodium 02/04/2018 131* 135 - 145 mmol/L Final  . Potassium 02/04/2018 4.1  3.5 - 5.1 mmol/L Final  . Chloride 02/04/2018 98  98 - 111 mmol/L Final  . CO2 02/04/2018 22  22 - 32 mmol/L Final  . Glucose, Bld 02/04/2018 97  70 - 99 mg/dL Final  . BUN 02/04/2018 12  8 - 23 mg/dL Final  . Creatinine, Ser 02/04/2018 0.94  0.44 -  1.00 mg/dL Final  . Calcium 02/04/2018 8.8* 8.9 - 10.3 mg/dL Final  . Total Protein 02/04/2018 6.5  6.5 - 8.1 g/dL Final  . Albumin 02/04/2018 3.6  3.5 - 5.0 g/dL Final  . AST 02/04/2018 18  15 - 41 U/L Final  . ALT 02/04/2018 20  0 - 44 U/L Final  . Alkaline Phosphatase 02/04/2018 45  38 - 126 U/L Final  . Total Bilirubin 02/04/2018 0.6  0.3 - 1.2 mg/dL Final  . GFR calc non Af Amer 02/04/2018 57* >60 mL/min Final  . GFR calc Af Amer 02/04/2018 >60  >60 mL/min Final   Comment: (NOTE) The eGFR has been calculated using the CKD EPI equation. This calculation has not been validated in all clinical situations. eGFR's persistently <60 mL/min signify possible Chronic Kidney Disease.   Georgiann Hahn gap 02/04/2018 11  5 - 15 Final   Performed at [facility], [place] [address], [place], [place] [address]  . WBC 02/04/2018 5.9  3.9 - 10.3 K/uL Final  . RBC 02/04/2018 3.93  3.70 - 5.45 MIL/uL Final  . Hemoglobin 02/04/2018 12.1  11.6 - 15.9 g/dL Final  . HCT 02/04/2018 36.6  34.8 - 46.6 % Final  . MCV 02/04/2018 93.1  79.5 - 101.0 fL Final  . MCH 02/04/2018 30.8  25.1 - 34.0 pg Final  . MCHC 02/04/2018 33.1  31.5 - 36.0 g/dL Final  . RDW 02/04/2018 11.9  11.2 - 14.5 % Final  . Platelets 02/04/2018 219  145 - 400 K/uL Final  . Neutrophils Relative % 02/04/2018 64  % Final  . Neutro Abs 02/04/2018 3.7  1.5 - 6.5 K/uL Final  . Lymphocytes Relative 02/04/2018 19  % Final  . Lymphs Abs 02/04/2018 1.1  0.9 - 3.3 K/uL Final  . Monocytes Relative 02/04/2018 13  % Final  . Monocytes Absolute 02/04/2018 0.8  0.1 - 0.9 K/uL Final  . Eosinophils Relative 02/04/2018 3  % Final  . Eosinophils Absolute 02/04/2018 0.2  0.0 - 0.5 K/uL Final  . Basophils Relative 02/04/2018 1  % Final  . Basophils Absolute 02/04/2018 0.0  0.0 - 0.1 K/uL Final   Performed at [facility], [address] [place], [place] [address]    (this displays the last labs from the last 3  days)  No results found for: TOTALPROTELP, ALBUMINELP, A1GS, A2GS, BETS, BETA2SER, GAMS, MSPIKE, SPEI (this displays SPEP labs)  No results found for: KPAFRELGTCHN, LAMBDASER, KAPLAMBRATIO (kappa/lambda light chains)  No results found for: HGBA, HGBA2QUANT, HGBFQUANT, HGBSQUAN (Hemoglobinopathy evaluation)   No results found for: LDH  Lab Results  Component Value Date   IRON 59 12/18/2010   IRONPCTSAT 12.5 (L) 12/18/2010   (Iron and TIBC)  No results found for: FERRITIN  Urinalysis    Component Value Date/Time   COLORURINE YELLOW 06/04/2010 0845   APPEARANCEUR CLOUDY (A) 06/04/2010 0845   LABSPEC 1.011 06/04/2010 0845   PHURINE 7.5 06/04/2010 0845   GLUCOSEU NEGATIVE 06/04/2010 0845   HGBUR NEGATIVE 06/04/2010 0845   BILIRUBINUR NEGATIVE 06/04/2010 0845   KETONESUR NEGATIVE 06/04/2010 0845   PROTEINUR NEGATIVE 06/04/2010 0845   UROBILINOGEN 0.2 06/04/2010 0845   NITRITE NEGATIVE 06/04/2010 0845   LEUKOCYTESUR LARGE (A) 06/04/2010 0845     STUDIES: She had a bilateral screening mammogram completed at [facility] on 02/03/2018 with results favorable per patient.  We do not have yet access to the report  ELIGIBLE FOR AVAILABLE RESEARCH PROTOCOL: no  ASSESSMENT: 77 y.o. [place] woman status post left breast upper outer quadrant biopsy 02/09/2017 for a clinical T1b N0, stage IA invasive ductal carcinoma, grade 1, estrogen and progesterone receptor positive, HER-2 nonamplified, with an MIB-1 of 2%  (1) breast conserving surgery with sentinel lymph node sampling  03/16/2017 showed a pT1b pN0, stage IA invasive ductal carcinoma, grade 1, with negative margins (2) adjuvant radiation not anticipated  (2) adjuvant radiation not felt to be needed if patient takes antiestrogen for 5 years  (3) Tamoxifen started 04/10/2017  (a) status post remote hysterectomy with bilateral salpingo-oophorectomy  (b) bone density at [facility] 03/30/2017 shows a T score of -1.0  PLAN: Brenda Brenda is  now just about a year out from definitive surgery for her breast cancer with no evidence of disease recurrence.  This is very favorable.  She is tolerating tamoxifen well.  The plan will be to continue that a minimum of 5 years.  She is experiencing some vertigo, which is not related to her breast cancer diagnosis or treatment.  I wonder if she would benefit  from some maneuvers through physical therapy.  She is agreeable to try and I have placed the referral  She will see me again a year from now.  She knows to call for any other issues that may develop before the next visit.  [name], [name], MD  02/04/18 11:04 AM Medical Oncology and Hematology [facility] [address] [place], [place] [address] Tel. [phone]    Fax. [phone]    I, Soijett Blue am acting as scribe for Dr. Sarajane Jews C. [name].  I, Lurline Del MD, have reviewed the above documentation for accuracy and completeness, and I agree with the above.

## 2018-02-04 ENCOUNTER — Inpatient Hospital Stay: Payer: MEDICARE | Attending: Oncology

## 2018-02-04 ENCOUNTER — Inpatient Hospital Stay (HOSPITAL_BASED_OUTPATIENT_CLINIC_OR_DEPARTMENT_OTHER): Payer: MEDICARE | Admitting: Oncology

## 2018-02-04 ENCOUNTER — Telehealth: Payer: Self-pay | Admitting: Oncology

## 2018-02-04 VITALS — BP 150/59 | HR 69 | Temp 97.5°F | Resp 18 | Ht 63.0 in

## 2018-02-04 DIAGNOSIS — R42 Dizziness and giddiness: Secondary | ICD-10-CM | POA: Diagnosis not present

## 2018-02-04 DIAGNOSIS — Z923 Personal history of irradiation: Secondary | ICD-10-CM | POA: Diagnosis not present

## 2018-02-04 DIAGNOSIS — C50411 Malignant neoplasm of upper-outer quadrant of right female breast: Secondary | ICD-10-CM | POA: Diagnosis not present

## 2018-02-04 DIAGNOSIS — Z170 Estrogen receptor positive status [ER+]: Secondary | ICD-10-CM | POA: Diagnosis not present

## 2018-02-04 DIAGNOSIS — Z79810 Long term (current) use of selective estrogen receptor modulators (SERMs): Secondary | ICD-10-CM | POA: Insufficient documentation

## 2018-02-04 DIAGNOSIS — Z90710 Acquired absence of both cervix and uterus: Secondary | ICD-10-CM | POA: Diagnosis not present

## 2018-02-04 DIAGNOSIS — Z90722 Acquired absence of ovaries, bilateral: Secondary | ICD-10-CM | POA: Diagnosis not present

## 2018-02-04 LAB — CBC WITH DIFFERENTIAL/PLATELET
BASOS ABS: 0 10*3/uL (ref 0.0–0.1)
BASOS PCT: 1 %
Eosinophils Absolute: 0.2 10*3/uL (ref 0.0–0.5)
Eosinophils Relative: 3 %
HEMATOCRIT: 36.6 % (ref 34.8–46.6)
HEMOGLOBIN: 12.1 g/dL (ref 11.6–15.9)
Lymphocytes Relative: 19 %
Lymphs Abs: 1.1 10*3/uL (ref 0.9–3.3)
MCH: 30.8 pg (ref 25.1–34.0)
MCHC: 33.1 g/dL (ref 31.5–36.0)
MCV: 93.1 fL (ref 79.5–101.0)
Monocytes Absolute: 0.8 10*3/uL (ref 0.1–0.9)
Monocytes Relative: 13 %
NEUTROS ABS: 3.7 10*3/uL (ref 1.5–6.5)
NEUTROS PCT: 64 %
Platelets: 219 10*3/uL (ref 145–400)
RBC: 3.93 MIL/uL (ref 3.70–5.45)
RDW: 11.9 % (ref 11.2–14.5)
WBC: 5.9 10*3/uL (ref 3.9–10.3)

## 2018-02-04 LAB — COMPREHENSIVE METABOLIC PANEL
ALK PHOS: 45 U/L (ref 38–126)
ALT: 20 U/L (ref 0–44)
ANION GAP: 11 (ref 5–15)
AST: 18 U/L (ref 15–41)
Albumin: 3.6 g/dL (ref 3.5–5.0)
BILIRUBIN TOTAL: 0.6 mg/dL (ref 0.3–1.2)
BUN: 12 mg/dL (ref 8–23)
CALCIUM: 8.8 mg/dL — AB (ref 8.9–10.3)
CO2: 22 mmol/L (ref 22–32)
CREATININE: 0.94 mg/dL (ref 0.44–1.00)
Chloride: 98 mmol/L (ref 98–111)
GFR, EST NON AFRICAN AMERICAN: 57 mL/min — AB (ref >60)
Glucose, Bld: 97 mg/dL (ref 70–99)
Potassium: 4.1 mmol/L (ref 3.5–5.1)
SODIUM: 131 mmol/L — AB (ref 135–145)
TOTAL PROTEIN: 6.5 g/dL (ref 6.5–8.1)

## 2018-02-04 NOTE — Telephone Encounter (Signed)
Gave patient avs and calendar of upcoming appts.  °

## 2018-03-26 ENCOUNTER — Other Ambulatory Visit: Payer: Self-pay | Admitting: Orthopaedic Surgery

## 2018-03-26 DIAGNOSIS — M25512 Pain in left shoulder: Secondary | ICD-10-CM

## 2018-03-31 ENCOUNTER — Ambulatory Visit
Admission: RE | Admit: 2018-03-31 | Discharge: 2018-03-31 | Disposition: A | Discharge status: Home / Self Care | Payer: MEDICARE | Source: Ambulatory Visit | Attending: Orthopaedic Surgery | Admitting: Orthopaedic Surgery

## 2018-03-31 DIAGNOSIS — M25512 Pain in left shoulder: Secondary | ICD-10-CM

## 2018-06-24 ENCOUNTER — Ambulatory Visit: Payer: MEDICARE | Admitting: Obstetrics & Gynecology

## 2018-06-29 ENCOUNTER — Encounter: Payer: Self-pay | Admitting: Obstetrics & Gynecology

## 2018-06-29 ENCOUNTER — Ambulatory Visit (INDEPENDENT_AMBULATORY_CARE_PROVIDER_SITE_OTHER): Payer: MEDICARE | Admitting: Obstetrics & Gynecology

## 2018-06-29 VITALS — BP 138/88

## 2018-06-29 DIAGNOSIS — B372 Candidiasis of skin and nail: Secondary | ICD-10-CM

## 2018-06-29 MED ORDER — CLOBETASOL PROPIONATE 0.05 % EX CREA: 1.0000 "application " | TOPICAL_CREAM | Freq: Two times a day (BID) | CUTANEOUS | 0 refills | Status: DC

## 2018-06-29 MED ORDER — NYSTATIN-TRIAMCINOLONE 100000-0.1 UNIT/GM-% EX OINT: 1.0000 "application " | TOPICAL_OINTMENT | Freq: Two times a day (BID) | CUTANEOUS | 0 refills | Status: DC

## 2018-06-29 MED ORDER — FLUCONAZOLE 150 MG PO TABS: 150.0000 mg | ORAL_TABLET | Freq: Every day | ORAL | 1 refills | Status: AC

## 2018-06-29 MED ORDER — NYSTATIN-TRIAMCINOLONE 100000-0.1 UNIT/GM-% EX OINT: 1.0000 "application " | TOPICAL_OINTMENT | Freq: Two times a day (BID) | CUTANEOUS | 2 refills | Status: DC

## 2018-06-29 NOTE — Patient Instructions (Signed)
1. Yeast infection of the skin Yeast infection of the skin under the breasts.  Counseling done on prevention and treatment.  Will treat with Fluconazole 1 tab daily x 3 and Mycolog ointment.  Prescriptions sent to pharmacy.  Other orders - fluconazole (DIFLUCAN) 150 MG tablet; Take 1 tablet (150 mg total) by mouth daily for 3 days. - nystatin-triamcinolone ointment (MYCOLOG); Apply 1 application topically 2 (two) times daily.  Margaretha Sheffield, good seeing you today!

## 2018-06-29 NOTE — Progress Notes (Signed)
    Brenda Stephens 01-15-1941 [id number]        77 y.o.  G1P1   RP: Itchy rash under the Rt >Lt breast x 2 weeks  HPI: Itchy red rash under the Rt breast x 2 weeks and a little bit starting under the Lt breast.  Tried Nystatin powder and Hydroxycortisone 1% cream without resolution of the rash.  No new soap or new bra.  No breast pain or lump.  No nipple d/c.  No fever.   OB History  Gravida Para Term Preterm AB Living  1 1       1   SAB TAB Ectopic Multiple Live Births               # Outcome Date GA Lbr Len/2nd Weight Sex Delivery Anes PTL Lv  1 Para             Past medical history,surgical history, problem list, medications, allergies, family history and social history were all reviewed and documented in the EPIC chart.   Directed ROS with pertinent positives and negatives documented in the history of present illness/assessment and plan.  Exam:  Vitals:   06/29/18 0912  BP: 138/88   General appearance:  Normal  Breasts:  Erythema under Rt breast >Lt breast c/w yeast.  No lesion otherwise.   Assessment/Plan:  77 y.o. G1P1   1. Yeast infection of the skin Yeast infection of the skin under the breasts.  Counseling done on prevention and treatment.  Will treat with Fluconazole 1 tab daily x 3 and Mycolog ointment.  Prescriptions sent to pharmacy.  Other orders - fluconazole (DIFLUCAN) 150 MG tablet; Take 1 tablet (150 mg total) by mouth daily for 3 days. - nystatin-triamcinolone ointment (MYCOLOG); Apply 1 application topically 2 (two) times daily.  Counseling on above issues >50% x 15 minutes.  Princess Bruins MD, 9:34 AM 06/29/2018

## 2018-08-04 ENCOUNTER — Encounter: Payer: Self-pay | Admitting: Internal Medicine

## 2018-08-04 ENCOUNTER — Ambulatory Visit (INDEPENDENT_AMBULATORY_CARE_PROVIDER_SITE_OTHER): Payer: MEDICARE | Admitting: Internal Medicine

## 2018-08-04 VITALS — BP 126/74 | HR 80 | Ht 63.0 in | Wt 182.2 lb

## 2018-08-04 DIAGNOSIS — J0110 Acute frontal sinusitis, unspecified: Secondary | ICD-10-CM | POA: Diagnosis not present

## 2018-08-04 DIAGNOSIS — J309 Allergic rhinitis, unspecified: Secondary | ICD-10-CM | POA: Diagnosis not present

## 2018-08-04 DIAGNOSIS — J3089 Other allergic rhinitis: Secondary | ICD-10-CM

## 2018-08-04 DIAGNOSIS — J302 Other seasonal allergic rhinitis: Secondary | ICD-10-CM

## 2018-08-04 MED ORDER — PHENYLEPHRINE HCL 1 % NA SOLN
3.0000 [drp] | NASAL | Status: AC
  Administered 2018-08-04: 3 [drp] via NASAL

## 2018-08-04 MED ORDER — METHYLPREDNISOLONE ACETATE 80 MG/ML IJ SUSP
80.0000 mg | Freq: Once | INTRAMUSCULAR | Status: AC
  Administered 2018-08-04: 80 mg via INTRAMUSCULAR

## 2018-08-04 MED ORDER — AZELASTINE HCL 0.1 % NA SOLN: NASAL | 12 refills | Status: DC

## 2018-08-04 NOTE — Progress Notes (Signed)
Subjective:    Patient ID: Brenda Stephens, female    DOB: [birth year], 78 y.o.   MRN: [id number]  HPI F former smoker followed for allergic rhinitis, complicated by HBP, GERD, Anemia, R breast cancer   ------------------------------------------------------------------------------------  08/04/17- 76 yoF former smoker followed for allergic rhinitis, complicated by HBP, GERD, Anemia, R breast cancer ACUTE VISIT: Pt is having heavy cough in chest; runny nose as well- was seen by 2 different MD's since 06-30-17-was given abx. Pt continues to have heaviness in her chest; SOB as well. Pt has had chills for some time now. Denies any wheezing. Acute bronchitis/rhinitis syndrome started around Christmas.  Treated with unknown antibiotic with partial improvement.  Mucus originally yellow brown.  Now persistent watery rhinorrhea, "heavy" feeling on her chest, dry cough.  Denies chest pain, palpitation, fever, adenopathy or rash.  Increase in chronic intermittent right frontal/right maxillary pressure headache sensation. Since last here breast cancer/right lumpectomy/tamoxife  08/04/2018- 76 yoF former smoker followed for allergic rhinitis, acute bronchitis,  complicated by HBP, GERD, Anemia, R breast cancer -----nasal dryness and nose feels congested though nothing comes out when blowing  nose - some drainage down back of throat  Right maxillary/facial pain over at least the last week.  Using Flonase daily, Children's Sudafed.  No fever or purulent discharge.  ROS-see HPI   + = positive Constitutional:   No-   weight loss, night sweats, fevers, chills, fatigue, lassitude. HEENT:   + headaches, difficulty swallowing, tooth/dental problems, sore throat,       No-  sneezing, itching, +ear ache, +nasal congestion, +post nasal drip,  CV:  No-   chest pain, orthopnea, PND, swelling in lower extremities, anasarca,  dizziness, palpitations Resp: No-   shortness of breath with exertion or at rest.             +   productive cough,  No non-productive cough,  No- coughing up of blood.              No-   change in color of mucus.  No- wheezing.   Skin: No-   rash or lesions. GI:  No-   heartburn, indigestion, abdominal pain, nausea, vomiting, GU:  MS:  No-   joint pain or swelling.  Neuro-     nothing unusual Psych:  No- change in mood or affect. No depression or anxiety.  No memory loss.    Objective:  OBJ- Physical Exam General- Alert, Oriented, Affect-appropriate, Distress- none acute, + overweight Skin- rash-none, lesions- none, excoriation- none Lymphadenopathy- none Head- atraumatic            Eyes- Gross vision intact, PERRLA, conjunctivae and secretions clear            Ears- Hearing ok,             Nose-, no-Septal dev,  polyps, erosion, perforation             Throat- Mallampati III , mucosa clear , drainage- none, tonsils- atrophic Neck- flexible , trachea midline, no stridor , thyroid nl, carotid no bruit Chest - symmetrical excursion , unlabored           Heart/CV- RRR , no murmur , no gallop  , no rub, nl s1 s2                           - JVD- none , edema- none, stasis changes- none, varices- none  Lung- clear to P&A, wheeze- none, cough none, dullness-none, rub- none           Chest wall-  Abd- Br/ Gen/ Rectal- Not done, not indicated Extrem- cyanosis- none, clubbing, none, atrophy- none, strength- nl.  Neuro- grossly intact to observation Assessment & Plan:

## 2018-08-04 NOTE — Patient Instructions (Signed)
Script sent for Astelin/ azelastine antihistamine nasal spray- can use it once or twice daily as needed for runny nose  Ok to continue Flonase while needed  Ok to use otc nasal saline spray or saline gel as often as needed for dry nose  Order - nasal neo inhalation     Dx seasonal and perennial allergic rhinitis                Depo 80

## 2018-08-08 NOTE — Assessment & Plan Note (Signed)
Flonase and occasional antihistamine has been sufficient.  We will get current symptoms controlled and watch expectantly as spring pollen season begins.

## 2018-08-08 NOTE — Assessment & Plan Note (Signed)
There is rhinitis with nasal congestion.  I do not think she has a bacterial sinus infection or a trigeminal neuralgia. Plan-Neo-Synephrine nasal inhalation treatment, Depo-Medrol

## 2018-08-31 ENCOUNTER — Encounter: Payer: Self-pay | Admitting: Obstetrics & Gynecology

## 2018-08-31 ENCOUNTER — Ambulatory Visit (INDEPENDENT_AMBULATORY_CARE_PROVIDER_SITE_OTHER): Payer: MEDICARE | Admitting: Obstetrics & Gynecology

## 2018-08-31 VITALS — BP 126/82

## 2018-08-31 DIAGNOSIS — B372 Candidiasis of skin and nail: Secondary | ICD-10-CM | POA: Diagnosis not present

## 2018-08-31 MED ORDER — NYSTATIN 100000 UNIT/GM EX POWD: Freq: Two times a day (BID) | CUTANEOUS | 3 refills | Status: AC

## 2018-08-31 NOTE — Progress Notes (Signed)
    Brenda Stephens 22-Dec-1940 [id number]        78 y.o.  G1P1L1  RP: Rash under the breasts x 3 months  HPI: Patient initially presented for that problem of bilateral rash under the breasts in December 2019.  At that time patient was treated for yeast infection of the skin with fluconazole tablets.  She was also prescribed Mycolog.  Her rash has improved with treatment, but she was worried about red lines that she saw under her breasts.  Patient is having no itching and no breast pain.  No breast lump felt.  No nipple discharge.   OB History  Gravida Para Term Preterm AB Living  1 1       1   SAB TAB Ectopic Multiple Live Births               # Outcome Date GA Lbr Len/2nd Weight Sex Delivery Anes PTL Lv  1 Para             Past medical history,surgical history, problem list, medications, allergies, family history and social history were all reviewed and documented in the EPIC chart.   Directed ROS with pertinent positives and negatives documented in the history of present illness/assessment and plan.  Exam:  Vitals:   08/31/18 1439  BP: 126/82   General appearance:  Normal  Skin under the breasts:  Mild bilateral erythema under the breasts.  Pressure lines probably from the seat belt under the breasts.   Assessment/Plan:  78 y.o. G1P1   1. Yeast infection of the skin Mild persistent yeast infection of the skin on the other breast.  Decision to treat with nystatin powder as needed.  Mild pressure lines probably caused by the seatbelt, recommendations discussed with patient.  Other orders - nystatin (MYCOSTATIN/NYSTOP) powder; Apply topically 2 (two) times daily for 14 days. Under the breasts.  Counseling on above issues and coordination of care more than 50% for 15 minutes.  Princess Bruins MD, 2:52 PM 08/31/2018

## 2018-09-06 ENCOUNTER — Encounter: Payer: Self-pay | Admitting: Obstetrics & Gynecology

## 2018-09-06 NOTE — Patient Instructions (Signed)
1. Yeast infection of the skin Mild persistent yeast infection of the skin on the other breast.  Decision to treat with nystatin powder as needed.  Mild pressure lines probably caused by the seatbelt, recommendations discussed with patient.  Other orders - nystatin (MYCOSTATIN/NYSTOP) powder; Apply topically 2 (two) times daily for 14 days. Under the breasts.  Brenda Stephens, it was a pleasure seeing you today!

## 2018-09-08 ENCOUNTER — Encounter: Payer: MEDICARE | Admitting: Obstetrics & Gynecology

## 2018-09-13 ENCOUNTER — Other Ambulatory Visit: Payer: Self-pay | Admitting: Oncology

## 2018-11-10 ENCOUNTER — Encounter: Payer: MEDICARE | Admitting: Obstetrics & Gynecology

## 2018-12-07 ENCOUNTER — Other Ambulatory Visit: Payer: Self-pay | Admitting: Oncology

## 2018-12-14 ENCOUNTER — Other Ambulatory Visit: Payer: Self-pay | Admitting: Orthopaedic Surgery

## 2018-12-14 DIAGNOSIS — M25512 Pain in left shoulder: Secondary | ICD-10-CM

## 2018-12-20 ENCOUNTER — Other Ambulatory Visit: Payer: MEDICARE

## 2018-12-22 ENCOUNTER — Telehealth: Payer: Self-pay | Admitting: Sports Medicine

## 2018-12-22 NOTE — Telephone Encounter (Signed)
As Dr.Thekkekdandam requested, I called pt and left a voicemail for patient to call our office to be seen for her shoulder [phone] Option 2 for scheduling

## 2018-12-22 NOTE — Telephone Encounter (Signed)
Thank you Jennifer.

## 2018-12-29 ENCOUNTER — Other Ambulatory Visit: Payer: Self-pay | Admitting: Gastroenterology

## 2018-12-29 DIAGNOSIS — K2100 Gastro-esophageal reflux disease with esophagitis, without bleeding: Secondary | ICD-10-CM

## 2019-01-03 ENCOUNTER — Other Ambulatory Visit (HOSPITAL_COMMUNITY): Payer: Self-pay | Admitting: *Deleted

## 2019-01-03 ENCOUNTER — Encounter (HOSPITAL_COMMUNITY): Payer: Self-pay

## 2019-01-03 NOTE — Progress Notes (Signed)
Stress test 12-29-17 epic lov dr hochrein cardiology 12-17-17

## 2019-01-03 NOTE — Patient Instructions (Addendum)
YOU NEED TO HAVE A COVID 19 TEST ON 01-08-19 @ 12:15 PM, THIS TEST MUST BE DONE BEFORE SURGERY, COME TO [facility] ENTRANCE. ONCE YOUR COVID TEST IS COMPLETED, PLEASE BEGIN THE QUARANTINE INSTRUCTIONS AS OUTLINED IN YOUR HANDOUT.                Brenda Stephens    Your procedure is scheduled on: 01-12-2019   Report to [facility] Main  Entrance    Report to admitting at 6:30  AM    Call this number if you have problems the morning of surgery [phone]    Remember: Do not eat food or drink liquids :After Midnight.     Take these medicines the morning of surgery with A SIP OF WATER: Metoprolol Succinate, Synthroid, Venlafaxine XR (Effexor). You also bring and use your eye drops and nasal spray as needed.   BRUSH YOUR TEETH MORNING OF SURGERY AND RINSE YOUR MOUTH OUT, NO CHEWING GUM CANDY OR MINTS.                              You may not have any metal on your body including hair pins and              piercings    Do not wear jewelry, cologne, lotions, powders or deodorant              Do not wear nail polish.  Do not shave  48 hours prior to surgery.          Do not bring valuables to the hospital. [place].  Contacts, dentures or bridgework may not be worn into surgery.       _____________________________________________________________________             [facility] - Preparing for Surgery Before surgery, you can play an important role.  Because skin is not sterile, your skin needs to be as free of germs as possible.  You can reduce the number of germs on your skin by washing with CHG (chlorahexidine gluconate) soap before surgery.  CHG is an antiseptic cleaner which kills germs and bonds with the skin to continue killing germs even after washing. Please DO NOT use if you have an allergy to CHG or antibacterial soaps.  If your skin becomes reddened/irritated stop using the CHG and inform  your nurse when you arrive at Short Stay. Do not shave (including legs and underarms) for at least 48 hours prior to the first CHG shower.  You may shave your face/neck. Please follow these instructions carefully:  1.  Shower with CHG Soap the night before surgery and the  morning of Surgery.  2.  If you choose to wash your hair, wash your hair first as usual with your  normal  shampoo.  3.  After you shampoo, rinse your hair and body thoroughly to remove the  shampoo.                           4.  Use CHG as you would any other liquid soap.  You can apply chg directly  to the skin and wash                       Gently  with a scrungie or clean washcloth.  5.  Apply the CHG Soap to your body ONLY FROM THE NECK DOWN.   Do not use on face/ open                           Wound or open sores. Avoid contact with eyes, ears mouth and genitals (private parts).                       Wash face,  Genitals (private parts) with your normal soap.             6.  Wash thoroughly, paying special attention to the area where your surgery  will be performed.  7.  Thoroughly rinse your body with warm water from the neck down.  8.  DO NOT shower/wash with your normal soap after using and rinsing off  the CHG Soap.                9.  Pat yourself dry with a clean towel.            10.  Wear clean pajamas.            11.  Place clean sheets on your bed the night of your first shower and do not  sleep with pets. Day of Surgery : Do not apply any lotions/deodorants the morning of surgery.  Please wear clean clothes to the hospital/surgery center.  FAILURE TO FOLLOW THESE INSTRUCTIONS MAY RESULT IN THE CANCELLATION OF YOUR SURGERY PATIENT SIGNATURE_________________________________  NURSE SIGNATURE__________________________________  ________________________________________________________________________

## 2019-01-04 ENCOUNTER — Other Ambulatory Visit: Payer: Self-pay

## 2019-01-04 ENCOUNTER — Encounter (HOSPITAL_COMMUNITY): Payer: Self-pay

## 2019-01-04 ENCOUNTER — Encounter (HOSPITAL_COMMUNITY)
Admission: RE | Admit: 2019-01-04 | Discharge: 2019-01-04 | Disposition: A | Discharge status: Home / Self Care | Payer: MEDICARE | Source: Ambulatory Visit | Attending: Orthopaedic Surgery | Admitting: Orthopaedic Surgery

## 2019-01-04 DIAGNOSIS — I10 Essential (primary) hypertension: Secondary | ICD-10-CM | POA: Diagnosis not present

## 2019-01-04 DIAGNOSIS — Z87891 Personal history of nicotine dependence: Secondary | ICD-10-CM | POA: Diagnosis not present

## 2019-01-04 DIAGNOSIS — Z01818 Encounter for other preprocedural examination: Secondary | ICD-10-CM | POA: Diagnosis present

## 2019-01-04 DIAGNOSIS — T84098A Other mechanical complication of other internal joint prosthesis, initial encounter: Secondary | ICD-10-CM | POA: Insufficient documentation

## 2019-01-04 LAB — SURGICAL PCR SCREEN
MRSA, PCR: POSITIVE — AB
Staphylococcus aureus: POSITIVE — AB

## 2019-01-04 LAB — BASIC METABOLIC PANEL
Anion gap: 9 (ref 5–15)
BUN: 25 mg/dL — ABNORMAL HIGH (ref 8–23)
CO2: 23 mmol/L (ref 22–32)
Calcium: 9 mg/dL (ref 8.9–10.3)
Chloride: 101 mmol/L (ref 98–111)
Creatinine, Ser: 1.05 mg/dL — ABNORMAL HIGH (ref 0.44–1.00)
GFR calc Af Amer: 59 mL/min — ABNORMAL LOW (ref >60)
GFR calc non Af Amer: 51 mL/min — ABNORMAL LOW (ref >60)
Glucose, Bld: 111 mg/dL — ABNORMAL HIGH (ref 70–99)
Potassium: 4.2 mmol/L (ref 3.5–5.1)
Sodium: 133 mmol/L — ABNORMAL LOW (ref 135–145)

## 2019-01-04 LAB — CBC
HCT: 39.1 % (ref 36.0–46.0)
Hemoglobin: 12.5 g/dL (ref 12.0–15.0)
MCH: 30.1 pg (ref 26.0–34.0)
MCHC: 32 g/dL (ref 30.0–36.0)
MCV: 94.2 fL (ref 80.0–100.0)
Platelets: 235 10*3/uL (ref 150–400)
RBC: 4.15 MIL/uL (ref 3.87–5.11)
RDW: 11.6 % (ref 11.5–15.5)
WBC: 6.9 10*3/uL (ref 4.0–10.5)
nRBC: 0 % (ref 0.0–0.2)

## 2019-01-04 LAB — ABO/RH: ABO/RH(D): A POS

## 2019-01-06 ENCOUNTER — Ambulatory Visit
Admission: RE | Admit: 2019-01-06 | Discharge: 2019-01-06 | Disposition: A | Discharge status: Home / Self Care | Payer: MEDICARE | Source: Ambulatory Visit | Attending: Gastroenterology | Admitting: Gastroenterology

## 2019-01-06 DIAGNOSIS — K2100 Gastro-esophageal reflux disease with esophagitis, without bleeding: Secondary | ICD-10-CM

## 2019-01-06 NOTE — Progress Notes (Signed)
Anesthesia Chart Review   Case: [id number] Date/Time: 01/12/19 0815   Procedure: REVISION TOTAL SHOULDER TO REVERSE TOTAL SHOULDER (Left )   Anesthesia type: Choice   Pre-op diagnosis: orthopedic implant complications left shoulder   Location: [facility] ROOM 06 / [facility]   Surgeon: Hiram Gash, MD      DISCUSSION:78 y.o. former smoker (3 pack years, quit 06/30/62) with h/o anxiety, depression, GERD, hypothyroidism, HTN, dyslipidemia, right breast cancer scheduled for above procedure 01/12/2019 with Dr. Ophelia Charter.   Pt last seen by cardiologist, Dr. [name], for evaluation of palpitations.  Myoview 12/29/17, low risk study, LVEF 70%.    Anticipate pt can proceed with planned procedure barring acute status change.   VS: BP (!) 148/75 (BP Location: Left Arm)   Pulse 77   Temp 36.9 C (Oral)   Resp 18   Ht 5\' 3"  (1.6 m)   Wt 86.8 kg   SpO2 98%   BMI 33.88 kg/m   PROVIDERS: Dyann Ruddle, MD is PCP    LABS: Labs reviewed: Acceptable for surgery. (all labs ordered are listed, but only abnormal results are displayed)  Labs Reviewed  SURGICAL PCR SCREEN - Abnormal; Notable for the following components:      Result Value   MRSA, PCR POSITIVE (*)    Staphylococcus aureus POSITIVE (*)    All other components within normal limits  BASIC METABOLIC PANEL - Abnormal; Notable for the following components:   Sodium 133 (*)    Glucose, Bld 111 (*)    BUN 25 (*)    Creatinine, Ser 1.05 (*)    GFR calc non Af Amer 51 (*)    GFR calc Af Amer 59 (*)    All other components within normal limits  CBC  TYPE AND SCREEN  ABO/RH     IMAGES:   EKG: 01/04/2019 Rate 70 bpm Normal sinus   CV: Stress Test 12/29/2017  The left ventricular ejection fraction is hyperdynamic (>65%).  Nuclear stress EF: 70%.  No T wave inversion was noted during stress.  There was no ST segment deviation noted during stress.  Defect 1: There is a small defect of mild severity.  This is a low risk  study.   Small size, mild intensity fixed apical anterior perfusion defect, likely breast attenuation artifact. No reversible ischemia. LVEF 70% with normal wall motion. This is a low risk study. Past Medical History:  Diagnosis Date  . Allergy    latex  . Anemia   . Anxiety   . Arthritis   . Cancer ([place]) 01/2017   right breast cancer  . Depression   . Dyslipidemia   . Eustachian tube dysfunction   . GERD (gastroesophageal reflux disease)   . Hx of cardiovascular stress test    Myoview 10/13:  apical thinning, EF 72%, no ischemia  . Hx of colonic polyps    Dr Watt Climes  . Hypertension   . Hypothyroidism   . Other abnormal glucose     Past Surgical History:  Procedure Laterality Date  . ABDOMINAL HYSTERECTOMY     BSO for uterine fibroids  . ACHILLES TENDON SURGERY Right 10/07/2012   Procedure: RIGHT REPAIR RUPTURE TENDON PRIMARY OPEN/PERCUTANEOUS ;  Surgeon: Ninetta Lights, MD;  Location: [facility];  Service: Orthopedics;  Laterality: Right;  . APPENDECTOMY    . BREAST LUMPECTOMY WITH RADIOACTIVE SEED AND SENTINEL LYMPH NODE BIOPSY Right 03/16/2017   Procedure: RIGHT BREAST LUMPECTOMY WITH RADIOACTIVE SEED AND RIGHT AXILLARY  SENTINEL  NODE BIOPSY ERAS PATHWAY;  Surgeon: [name], MD;  Location: [facility];  Service: General;  Laterality: Right;  PECTORAL BLOCK  . CATARACT EXTRACTION, BILATERAL    . CHOLECYSTECTOMY    . COLONOSCOPY W/ POLYPECTOMY     Dr Watt Climes  . EAR CYST EXCISION Right 10/07/2012   Procedure: EXCISION BONE CYST BENIGN TUMOR [name] ;  Surgeon: Ninetta Lights, MD;  Location: [place];  Service: Orthopedics;  Laterality: Right;  . EXPLORATORY LAPAROTOMY    . SINUS SURGERY WITH INSTATRAK    . TOTAL SHOULDER REPLACEMENT     Right shoulder  . 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 TUBAL LIGATION      MEDICATIONS: . atorvastatin (LIPITOR) 20 MG tablet  . diclofenac sodium (VOLTAREN) 1 %  GEL  . dicyclomine (BENTYL) 10 MG capsule  . esomeprazole (NEXIUM 24HR) 20 MG capsule  . meloxicam (MOBIC) 15 MG tablet  . metoprolol succinate (TOPROL-XL) 50 MG 24 hr tablet  . Multiple Vitamins-Minerals (CENTRUM SILVER 50+WOMEN) TABS  . oxybutynin (DITROPAN) 5 MG tablet  . Polyethyl Glycol-Propyl Glycol (SYSTANE) 0.4-0.3 % GEL ophthalmic gel  . pseudoephedrine (SUDAFED) 30 MG/5ML syrup  . Saline (AYR SALINE NASAL DROPS) 0.65 % (SOLN) SOLN  . spironolactone-hydrochlorothiazide (ALDACTAZIDE) 25-25 MG per tablet  . SYNTHROID 50 MCG tablet  . tamoxifen (NOLVADEX) 20 MG tablet  . venlafaxine XR (EFFEXOR-XR) 150 MG 24 hr capsule  . verapamil (CALAN-SR) 240 MG CR tablet  . zolpidem (AMBIEN) 5 MG tablet   No current facility-administered medications for this encounter.     Maia Plan [facility] Pre-Surgical Testing [phone] 01/06/19  10:43 AM

## 2019-01-06 NOTE — Anesthesia Preprocedure Evaluation (Addendum)
Anesthesia Evaluation  Patient identified by MRN, date of birth, ID band Patient awake    Reviewed: Allergy & Precautions, NPO status , Patient's Chart, lab work & pertinent test results, reviewed documented beta blocker date and time   Airway Mallampati: III  TM Distance: >3 FB Neck ROM: Full  Mouth opening: Limited Mouth Opening  Dental no notable dental hx. (+) Teeth Intact, Dental Advisory Given   Pulmonary neg pulmonary ROS, former smoker,    Pulmonary exam normal breath sounds clear to auscultation       Cardiovascular hypertension, Pt. on home beta blockers and Pt. on medications Normal cardiovascular exam Rhythm:Regular Rate:Normal  Stress Test 2019 Small size, mild intensity fixed apical anterior perfusion defect, likely breast attenuation artifact. No reversible ischemia. LVEF 70% with normal wall motion. This is a low risk study   Neuro/Psych PSYCHIATRIC DISORDERS Anxiety Depression negative neurological ROS     GI/Hepatic Neg liver ROS, GERD  ,  Endo/Other  Hypothyroidism   Renal/GU negative Renal ROS  negative genitourinary   Musculoskeletal  (+) Arthritis ,   Abdominal   Peds  Hematology negative hematology ROS (+)   Anesthesia Other Findings   Reproductive/Obstetrics                           Anesthesia Physical Anesthesia Plan  ASA: II  Anesthesia Plan: General and Regional   Post-op Pain Management:  Regional for Post-op pain   Induction: Intravenous  PONV Risk Score and Plan: 3 and Ondansetron, Dexamethasone and Treatment may vary due to age or medical condition  Airway Management Planned: Oral ETT  Additional Equipment:   Intra-op Plan:   Post-operative Plan: Extubation in OR  Informed Consent: I have reviewed the patients History and Physical, chart, labs and discussed the procedure including the risks, benefits and alternatives for the proposed anesthesia  with the patient or authorized representative who has indicated his/her understanding and acceptance.     Dental advisory given  Plan Discussed with: CRNA  Anesthesia Plan Comments:        Anesthesia Quick Evaluation

## 2019-01-08 ENCOUNTER — Other Ambulatory Visit (HOSPITAL_COMMUNITY)
Admission: RE | Admit: 2019-01-08 | Discharge: 2019-01-08 | Disposition: A | Discharge status: Home / Self Care | Payer: MEDICARE | Source: Ambulatory Visit | Attending: Orthopaedic Surgery | Admitting: Orthopaedic Surgery

## 2019-01-08 DIAGNOSIS — Z1159 Encounter for screening for other viral diseases: Secondary | ICD-10-CM | POA: Diagnosis not present

## 2019-01-08 DIAGNOSIS — Z01812 Encounter for preprocedural laboratory examination: Secondary | ICD-10-CM | POA: Diagnosis present

## 2019-01-09 LAB — SARS CORONAVIRUS 2 (TAT 6-24 HRS): SARS Coronavirus 2: NEGATIVE

## 2019-01-10 ENCOUNTER — Ambulatory Visit
Admission: RE | Admit: 2019-01-10 | Discharge: 2019-01-10 | Disposition: A | Discharge status: Home / Self Care | Payer: MEDICARE | Source: Ambulatory Visit | Attending: Orthopaedic Surgery | Admitting: Orthopaedic Surgery

## 2019-01-10 ENCOUNTER — Other Ambulatory Visit: Payer: Self-pay

## 2019-01-10 DIAGNOSIS — M25512 Pain in left shoulder: Secondary | ICD-10-CM

## 2019-01-12 ENCOUNTER — Inpatient Hospital Stay (HOSPITAL_COMMUNITY): Payer: MEDICARE | Admitting: Physician Assistant

## 2019-01-12 ENCOUNTER — Other Ambulatory Visit: Payer: Self-pay

## 2019-01-12 ENCOUNTER — Inpatient Hospital Stay (HOSPITAL_COMMUNITY)
Admission: RE | Admit: 2019-01-12 | Discharge: 2019-01-13 | DRG: 483 | Disposition: A | Discharge status: Home / Self Care | Payer: MEDICARE | Attending: Orthopaedic Surgery | Admitting: Orthopaedic Surgery

## 2019-01-12 ENCOUNTER — Encounter (HOSPITAL_COMMUNITY): Payer: Self-pay

## 2019-01-12 ENCOUNTER — Inpatient Hospital Stay (HOSPITAL_COMMUNITY): Payer: MEDICARE

## 2019-01-12 ENCOUNTER — Encounter (HOSPITAL_COMMUNITY)
Admission: RE | Disposition: A | Discharge status: Home / Self Care | Payer: Self-pay | Source: Home / Self Care | Attending: Orthopaedic Surgery

## 2019-01-12 ENCOUNTER — Inpatient Hospital Stay (HOSPITAL_COMMUNITY): Payer: MEDICARE | Admitting: Anesthesiology

## 2019-01-12 DIAGNOSIS — Z885 Allergy status to narcotic agent status: Secondary | ICD-10-CM | POA: Diagnosis not present

## 2019-01-12 DIAGNOSIS — T84098A Other mechanical complication of other internal joint prosthesis, initial encounter: Secondary | ICD-10-CM | POA: Diagnosis present

## 2019-01-12 DIAGNOSIS — Z853 Personal history of malignant neoplasm of breast: Secondary | ICD-10-CM

## 2019-01-12 DIAGNOSIS — Z886 Allergy status to analgesic agent status: Secondary | ICD-10-CM | POA: Diagnosis not present

## 2019-01-12 DIAGNOSIS — I10 Essential (primary) hypertension: Secondary | ICD-10-CM | POA: Diagnosis present

## 2019-01-12 DIAGNOSIS — Z882 Allergy status to sulfonamides status: Secondary | ICD-10-CM | POA: Diagnosis not present

## 2019-01-12 DIAGNOSIS — Y92019 Unspecified place in single-family (private) house as the place of occurrence of the external cause: Secondary | ICD-10-CM | POA: Diagnosis not present

## 2019-01-12 DIAGNOSIS — Z791 Long term (current) use of non-steroidal anti-inflammatories (NSAID): Secondary | ICD-10-CM

## 2019-01-12 DIAGNOSIS — Z9842 Cataract extraction status, left eye: Secondary | ICD-10-CM

## 2019-01-12 DIAGNOSIS — K219 Gastro-esophageal reflux disease without esophagitis: Secondary | ICD-10-CM | POA: Diagnosis present

## 2019-01-12 DIAGNOSIS — Z09 Encounter for follow-up examination after completed treatment for conditions other than malignant neoplasm: Secondary | ICD-10-CM

## 2019-01-12 DIAGNOSIS — M1990 Unspecified osteoarthritis, unspecified site: Secondary | ICD-10-CM | POA: Diagnosis present

## 2019-01-12 DIAGNOSIS — E785 Hyperlipidemia, unspecified: Secondary | ICD-10-CM | POA: Diagnosis present

## 2019-01-12 DIAGNOSIS — Z8249 Family history of ischemic heart disease and other diseases of the circulatory system: Secondary | ICD-10-CM

## 2019-01-12 DIAGNOSIS — F418 Other specified anxiety disorders: Secondary | ICD-10-CM | POA: Diagnosis present

## 2019-01-12 DIAGNOSIS — Z79899 Other long term (current) drug therapy: Secondary | ICD-10-CM

## 2019-01-12 DIAGNOSIS — Z87891 Personal history of nicotine dependence: Secondary | ICD-10-CM

## 2019-01-12 DIAGNOSIS — Z79890 Hormone replacement therapy (postmenopausal): Secondary | ICD-10-CM | POA: Diagnosis not present

## 2019-01-12 DIAGNOSIS — Z91040 Latex allergy status: Secondary | ICD-10-CM

## 2019-01-12 DIAGNOSIS — Y792 Prosthetic and other implants, materials and accessory orthopedic devices associated with adverse incidents: Secondary | ICD-10-CM | POA: Diagnosis present

## 2019-01-12 DIAGNOSIS — Z9841 Cataract extraction status, right eye: Secondary | ICD-10-CM

## 2019-01-12 DIAGNOSIS — E039 Hypothyroidism, unspecified: Secondary | ICD-10-CM | POA: Diagnosis present

## 2019-01-12 DIAGNOSIS — Z8051 Family history of malignant neoplasm of kidney: Secondary | ICD-10-CM

## 2019-01-12 DIAGNOSIS — M12812 Other specific arthropathies, not elsewhere classified, left shoulder: Secondary | ICD-10-CM | POA: Diagnosis present

## 2019-01-12 DIAGNOSIS — Z419 Encounter for procedure for purposes other than remedying health state, unspecified: Secondary | ICD-10-CM

## 2019-01-12 DIAGNOSIS — Y838 Other surgical procedures as the cause of abnormal reaction of the patient, or of later complication, without mention of misadventure at the time of the procedure: Secondary | ICD-10-CM | POA: Diagnosis present

## 2019-01-12 HISTORY — PX: REVISION TOTAL SHOULDER TO REVERSE TOTAL SHOULDER: SHX6313

## 2019-01-12 LAB — TYPE AND SCREEN
ABO/RH(D): A POS
Antibody Screen: NEGATIVE

## 2019-01-12 SURGERY — REVISION, REVERSE TOTAL ARTHROPLASTY, SHOULDER
Anesthesia: Regional | Laterality: Left

## 2019-01-12 MED ORDER — EPHEDRINE 5 MG/ML INJ
INTRAVENOUS | Status: AC
  Filled 2019-01-12: qty 10

## 2019-01-12 MED ORDER — LACTATED RINGERS IV SOLN
INTRAVENOUS | Status: DC
  Administered 2019-01-12 (×2): via INTRAVENOUS

## 2019-01-12 MED ORDER — DEXAMETHASONE SODIUM PHOSPHATE 10 MG/ML IJ SOLN
INTRAMUSCULAR | Status: DC | PRN
  Administered 2019-01-12: 8 mg via INTRAVENOUS

## 2019-01-12 MED ORDER — ROCURONIUM BROMIDE 10 MG/ML (PF) SYRINGE
PREFILLED_SYRINGE | INTRAVENOUS | Status: DC | PRN
  Administered 2019-01-12 (×2): 10 mg via INTRAVENOUS
  Administered 2019-01-12: 60 mg via INTRAVENOUS

## 2019-01-12 MED ORDER — BUPIVACAINE LIPOSOME 1.3 % IJ SUSP
INTRAMUSCULAR | Status: DC | PRN
  Administered 2019-01-12: 10 mL

## 2019-01-12 MED ORDER — BUPIVACAINE HCL (PF) 0.5 % IJ SOLN
INTRAMUSCULAR | Status: DC | PRN
  Administered 2019-01-12: 15 mL via PERINEURAL

## 2019-01-12 MED ORDER — LIDOCAINE 2% (20 MG/ML) 5 ML SYRINGE
INTRAMUSCULAR | Status: DC | PRN
  Administered 2019-01-12: 60 mg via INTRAVENOUS

## 2019-01-12 MED ORDER — VENLAFAXINE HCL ER 150 MG PO CP24
150.0000 mg | ORAL_CAPSULE | Freq: Every day | ORAL | Status: DC
  Administered 2019-01-13: 08:00:00 150 mg via ORAL
  Filled 2019-01-12: qty 1

## 2019-01-12 MED ORDER — CELECOXIB 200 MG PO CAPS
200.0000 mg | ORAL_CAPSULE | Freq: Two times a day (BID) | ORAL | Status: DC
  Administered 2019-01-12 – 2019-01-13 (×3): 200 mg via ORAL
  Filled 2019-01-12 (×3): qty 1

## 2019-01-12 MED ORDER — ONDANSETRON HCL 4 MG/2ML IJ SOLN
4.0000 mg | Freq: Four times a day (QID) | INTRAMUSCULAR | Status: DC | PRN
  Administered 2019-01-12: 22:00:00 4 mg via INTRAVENOUS
  Filled 2019-01-12: qty 2

## 2019-01-12 MED ORDER — FENTANYL CITRATE (PF) 100 MCG/2ML IJ SOLN: 25.0000 ug | INTRAMUSCULAR | Status: DC | PRN

## 2019-01-12 MED ORDER — VERAPAMIL HCL ER 240 MG PO TBCR
240.0000 mg | EXTENDED_RELEASE_TABLET | Freq: Every day | ORAL | Status: DC
  Administered 2019-01-12: 22:00:00 240 mg via ORAL
  Filled 2019-01-12: qty 1

## 2019-01-12 MED ORDER — DEXAMETHASONE SODIUM PHOSPHATE 10 MG/ML IJ SOLN
INTRAMUSCULAR | Status: AC
  Filled 2019-01-12: qty 1

## 2019-01-12 MED ORDER — METOCLOPRAMIDE HCL 5 MG PO TABS: 5.0000 mg | ORAL_TABLET | Freq: Three times a day (TID) | ORAL | Status: DC | PRN

## 2019-01-12 MED ORDER — SUGAMMADEX SODIUM 200 MG/2ML IV SOLN
INTRAVENOUS | Status: DC | PRN
  Administered 2019-01-12: 175 mg via INTRAVENOUS

## 2019-01-12 MED ORDER — METOCLOPRAMIDE HCL 5 MG/ML IJ SOLN: 5.0000 mg | Freq: Three times a day (TID) | INTRAMUSCULAR | Status: DC | PRN

## 2019-01-12 MED ORDER — LABETALOL HCL 5 MG/ML IV SOLN
INTRAVENOUS | Status: AC
  Filled 2019-01-12: qty 4

## 2019-01-12 MED ORDER — PANTOPRAZOLE SODIUM 40 MG PO TBEC
80.0000 mg | DELAYED_RELEASE_TABLET | Freq: Every day | ORAL | Status: DC
  Administered 2019-01-12: 15:00:00 80 mg via ORAL
  Filled 2019-01-12: qty 2

## 2019-01-12 MED ORDER — ONDANSETRON HCL 4 MG/2ML IJ SOLN
INTRAMUSCULAR | Status: AC
  Filled 2019-01-12: qty 2

## 2019-01-12 MED ORDER — HYDROMORPHONE HCL 1 MG/ML IJ SOLN: 0.5000 mg | INTRAMUSCULAR | Status: DC | PRN

## 2019-01-12 MED ORDER — ACETAMINOPHEN 500 MG PO TABS
1000.0000 mg | ORAL_TABLET | Freq: Once | ORAL | Status: AC
  Administered 2019-01-12: 08:00:00 1000 mg via ORAL
  Filled 2019-01-12: qty 2

## 2019-01-12 MED ORDER — FENTANYL CITRATE (PF) 100 MCG/2ML IJ SOLN
INTRAMUSCULAR | Status: DC | PRN
  Administered 2019-01-12 (×2): 50 ug via INTRAVENOUS
  Administered 2019-01-12: 25 ug via INTRAVENOUS
  Administered 2019-01-12: 50 ug via INTRAVENOUS
  Administered 2019-01-12: 25 ug via INTRAVENOUS
  Administered 2019-01-12: 50 ug via INTRAVENOUS

## 2019-01-12 MED ORDER — LIDOCAINE 2% (20 MG/ML) 5 ML SYRINGE
INTRAMUSCULAR | Status: AC
  Filled 2019-01-12: qty 5

## 2019-01-12 MED ORDER — ACETAMINOPHEN 500 MG PO TABS
1000.0000 mg | ORAL_TABLET | Freq: Three times a day (TID) | ORAL | Status: DC
  Administered 2019-01-12 – 2019-01-13 (×3): 1000 mg via ORAL
  Filled 2019-01-12 (×3): qty 2

## 2019-01-12 MED ORDER — ROCURONIUM BROMIDE 10 MG/ML (PF) SYRINGE
PREFILLED_SYRINGE | INTRAVENOUS | Status: AC
  Filled 2019-01-12: qty 10

## 2019-01-12 MED ORDER — ONDANSETRON HCL 4 MG/2ML IJ SOLN
INTRAMUSCULAR | Status: DC | PRN
  Administered 2019-01-12: 4 mg via INTRAVENOUS

## 2019-01-12 MED ORDER — ONDANSETRON HCL 4 MG PO TABS: 4.0000 mg | ORAL_TABLET | Freq: Four times a day (QID) | ORAL | Status: DC | PRN

## 2019-01-12 MED ORDER — ZOLPIDEM TARTRATE 5 MG PO TABS: 5.0000 mg | ORAL_TABLET | Freq: Every evening | ORAL | Status: DC | PRN

## 2019-01-12 MED ORDER — VANCOMYCIN HCL IN DEXTROSE 750-5 MG/150ML-% IV SOLN
750.0000 mg | Freq: Two times a day (BID) | INTRAVENOUS | Status: DC
  Administered 2019-01-12 – 2019-01-13 (×2): 750 mg via INTRAVENOUS
  Filled 2019-01-12 (×2): qty 150

## 2019-01-12 MED ORDER — 0.9 % SODIUM CHLORIDE (POUR BTL) OPTIME
TOPICAL | Status: DC | PRN
  Administered 2019-01-12: 1000 mL

## 2019-01-12 MED ORDER — SODIUM CHLORIDE 0.9 % IR SOLN
Status: DC | PRN
  Administered 2019-01-12: 3000 mL

## 2019-01-12 MED ORDER — SPIRONOLACTONE-HCTZ 25-25 MG PO TABS
1.0000 | ORAL_TABLET | Freq: Every day | ORAL | Status: DC
  Filled 2019-01-12: qty 1

## 2019-01-12 MED ORDER — STERILE WATER FOR IRRIGATION IR SOLN
Status: DC | PRN
  Administered 2019-01-12: 2000 mL

## 2019-01-12 MED ORDER — VANCOMYCIN HCL 1 G IV SOLR
INTRAVENOUS | Status: DC | PRN
  Administered 2019-01-12: 1000 mg via TOPICAL

## 2019-01-12 MED ORDER — OXYCODONE HCL 5 MG PO TABS
5.0000 mg | ORAL_TABLET | ORAL | Status: DC | PRN
  Administered 2019-01-13: 5 mg via ORAL
  Filled 2019-01-12: qty 1

## 2019-01-12 MED ORDER — ATORVASTATIN CALCIUM 20 MG PO TABS
20.0000 mg | ORAL_TABLET | Freq: Every day | ORAL | Status: DC
  Administered 2019-01-12: 20 mg via ORAL
  Filled 2019-01-12: qty 1

## 2019-01-12 MED ORDER — PROPOFOL 10 MG/ML IV BOLUS
INTRAVENOUS | Status: AC
  Filled 2019-01-12: qty 20

## 2019-01-12 MED ORDER — CHLORHEXIDINE GLUCONATE 4 % EX LIQD: 60.0000 mL | Freq: Once | CUTANEOUS | Status: DC

## 2019-01-12 MED ORDER — DIPHENHYDRAMINE HCL 12.5 MG/5ML PO ELIX: 12.5000 mg | ORAL_SOLUTION | ORAL | Status: DC | PRN

## 2019-01-12 MED ORDER — FENTANYL CITRATE (PF) 250 MCG/5ML IJ SOLN
INTRAMUSCULAR | Status: AC
  Filled 2019-01-12: qty 5

## 2019-01-12 MED ORDER — TRANEXAMIC ACID-NACL 1000-0.7 MG/100ML-% IV SOLN
1000.0000 mg | INTRAVENOUS | Status: AC
  Administered 2019-01-12: 1000 mg via INTRAVENOUS
  Filled 2019-01-12: qty 100

## 2019-01-12 MED ORDER — VANCOMYCIN HCL IN DEXTROSE 1-5 GM/200ML-% IV SOLN
1000.0000 mg | INTRAVENOUS | Status: AC
  Administered 2019-01-12: 1000 mg via INTRAVENOUS
  Filled 2019-01-12: qty 200

## 2019-01-12 MED ORDER — PHENYLEPHRINE HCL (PRESSORS) 10 MG/ML IV SOLN
INTRAVENOUS | Status: AC
  Filled 2019-01-12: qty 1

## 2019-01-12 MED ORDER — VANCOMYCIN HCL 1000 MG IV SOLR
INTRAVENOUS | Status: AC
  Filled 2019-01-12: qty 1000

## 2019-01-12 MED ORDER — METOPROLOL SUCCINATE ER 50 MG PO TB24
50.0000 mg | ORAL_TABLET | Freq: Every day | ORAL | Status: DC
  Administered 2019-01-13: 08:00:00 50 mg via ORAL
  Filled 2019-01-12: qty 1

## 2019-01-12 MED ORDER — DOCUSATE SODIUM 100 MG PO CAPS
100.0000 mg | ORAL_CAPSULE | Freq: Two times a day (BID) | ORAL | Status: DC
  Administered 2019-01-12 – 2019-01-13 (×2): 100 mg via ORAL
  Filled 2019-01-12 (×2): qty 1

## 2019-01-12 MED ORDER — LABETALOL HCL 5 MG/ML IV SOLN
INTRAVENOUS | Status: DC | PRN
  Administered 2019-01-12 (×2): 2.5 mg via INTRAVENOUS

## 2019-01-12 MED ORDER — LEVOTHYROXINE SODIUM 50 MCG PO TABS
50.0000 ug | ORAL_TABLET | Freq: Every day | ORAL | Status: DC
  Administered 2019-01-13: 05:00:00 50 ug via ORAL
  Filled 2019-01-12: qty 1

## 2019-01-12 MED ORDER — TAMOXIFEN CITRATE 10 MG PO TABS
20.0000 mg | ORAL_TABLET | Freq: Every day | ORAL | Status: DC
  Administered 2019-01-12 – 2019-01-13 (×2): 20 mg via ORAL
  Filled 2019-01-12 (×2): qty 2

## 2019-01-12 MED ORDER — PROPOFOL 10 MG/ML IV BOLUS
INTRAVENOUS | Status: DC | PRN
  Administered 2019-01-12: 120 mg via INTRAVENOUS

## 2019-01-12 SURGICAL SUPPLY — 77 items
AID PSTN UNV HD RSTRNT DISP (MISCELLANEOUS) ×1
APL PRP STRL LF DISP 70% ISPRP (MISCELLANEOUS) ×2
BASEPLATE GLENOSPHERE 25 STD (Miscellaneous) ×1 IMPLANT
BASEPLATE GLENOSPHERE 25MM STD (Miscellaneous) ×1 IMPLANT
BIT DRILL 3.2 PERIPHERAL SCREW (BIT) ×2 IMPLANT
BLADE EXTENDED COATED 6.5IN (ELECTRODE) IMPLANT
BLADE OSTEOTOME FLAT AEQ 4X3 (Miscellaneous) ×2 IMPLANT
BLADE OSTEOTOME RADIAL AEQ 14 (Miscellaneous) ×2 IMPLANT
BLADE SAW SAG 73X25 THK (BLADE) ×2
BLADE SAW SGTL 73X25 THK (BLADE) ×1 IMPLANT
BODY PROXIMAL PTC 11 132.5D (Spacer) IMPLANT
BSPLAT GLND STD 25 RVRS SHLDR (Miscellaneous) ×1 IMPLANT
CAP LOCKING COCR (Cap) ×2 IMPLANT
CHLORAPREP W/TINT 26 (MISCELLANEOUS) ×6 IMPLANT
CLOSURE STERI-STRIP 1/2X4 (GAUZE/BANDAGES/DRESSINGS) ×1
CLSR STERI-STRIP ANTIMIC 1/2X4 (GAUZE/BANDAGES/DRESSINGS) ×2 IMPLANT
CONT SPEC 4OZ CLIKSEAL STRL BL (MISCELLANEOUS) ×2 IMPLANT
COOLER ICEMAN CLASSIC (MISCELLANEOUS) IMPLANT
COVER BACK TABLE 60X90IN (DRAPES) ×2 IMPLANT
COVER SURGICAL LIGHT HANDLE (MISCELLANEOUS) ×3 IMPLANT
COVER WAND RF STERILE (DRAPES) ×1 IMPLANT
DRAPE C-ARM 42X120 X-RAY (DRAPES) ×2 IMPLANT
DRAPE INCISE IOBAN 66X45 STRL (DRAPES) ×3 IMPLANT
DRAPE ORTHO SPLIT 77X108 STRL (DRAPES) ×6
DRAPE SHEET LG 3/4 BI-LAMINATE (DRAPES) ×5 IMPLANT
DRAPE SURG ORHT 6 SPLT 77X108 (DRAPES) ×2 IMPLANT
DRSG AQUACEL AG ADV 3.5X 6 (GAUZE/BANDAGES/DRESSINGS) ×3 IMPLANT
ELECT BLADE TIP CTD 4 INCH (ELECTRODE) ×3 IMPLANT
ELECT REM PT RETURN 15FT ADLT (MISCELLANEOUS) ×3 IMPLANT
GLENOSPHERE REV SHOULDER 36 (Joint) ×2 IMPLANT
GLOVE BIO SURGEON STRL SZ8 (GLOVE) ×3 IMPLANT
GLOVE BIOGEL PI IND STRL 8 (GLOVE) ×2 IMPLANT
GLOVE BIOGEL PI INDICATOR 8 (GLOVE) ×4
GLOVE ECLIPSE 8.0 STRL XLNG CF (GLOVE) ×2 IMPLANT
GLOVE SURG SS PI 8.0 STRL IVOR (GLOVE) ×4 IMPLANT
GOWN SPEC L3 XXLG W/TWL (GOWN DISPOSABLE) ×3 IMPLANT
GOWN STRL REUS W/ TWL XL LVL3 (GOWN DISPOSABLE) ×1 IMPLANT
GOWN STRL REUS W/TWL XL LVL3 (GOWN DISPOSABLE) ×3
GUIDEWIRE GLENOID 2.5X220 (WIRE) ×2 IMPLANT
HANDPIECE INTERPULSE COAX TIP (DISPOSABLE) ×3
HEMOSTAT SURGICEL 2X14 (HEMOSTASIS) IMPLANT
IMPL REVERSE SHOULDER 0X3.5 (Shoulder) IMPLANT
IMPLANT REVERSE SHOULDER 0X3.5 (Shoulder) ×3 IMPLANT
INSERT HUMERAL 36X6MM 12.5DEG (Insert) ×2 IMPLANT
KIT BASIN OR (CUSTOM PROCEDURE TRAY) ×3 IMPLANT
KIT STABILIZATION SHOULDER (MISCELLANEOUS) ×3 IMPLANT
KIT TURNOVER KIT A (KITS) IMPLANT
MANIFOLD NEPTUNE II (INSTRUMENTS) ×3 IMPLANT
NDL HYPO 25X1 1.5 SAFETY (NEEDLE) IMPLANT
NDL MAYO CATGUT SZ4 TPR NDL (NEEDLE) IMPLANT
NEEDLE HYPO 25X1 1.5 SAFETY (NEEDLE) IMPLANT
NEEDLE MAYO CATGUT SZ4 (NEEDLE) IMPLANT
NS IRRIG 1000ML POUR BTL (IV SOLUTION) ×3 IMPLANT
PACK SHOULDER (CUSTOM PROCEDURE TRAY) ×3 IMPLANT
PAD COLD SHLDR WRAP-ON (PAD) IMPLANT
PROXIMAL BODY PTC 11 132.5D (Spacer) ×3 IMPLANT
RESTRAINT HEAD UNIVERSAL NS (MISCELLANEOUS) ×3 IMPLANT
SCREW ASSEMBLY COCR TYPE 0 (Screw) ×2 IMPLANT
SCREW BONE 6.5X40 SM (Screw) ×2 IMPLANT
SCREW PERIPHERAL 30 (Screw) ×2 IMPLANT
SCREW PERIPHERAL 5.0X34 (Screw) ×2 IMPLANT
SET HNDPC FAN SPRY TIP SCT (DISPOSABLE) ×1 IMPLANT
SLING ULTRA III MED (ORTHOPEDIC SUPPLIES) ×3 IMPLANT
SPONGE LAP 18X18 X RAY DECT (DISPOSABLE) IMPLANT
STEM PRTL DISTAL 11 SHOULDER (Miscellaneous) ×2 IMPLANT
SUCTION FRAZIER HANDLE 12FR (TUBING) ×2
SUCTION TUBE FRAZIER 12FR DISP (TUBING) ×1 IMPLANT
SUT ETHIBOND 2 V 37 (SUTURE) ×3 IMPLANT
SUT ETHIBOND NAB CT1 #1 30IN (SUTURE) ×3 IMPLANT
SUT FIBERWIRE #5 38 CONV NDL (SUTURE) ×12
SUT MON AB 3-0 SH 27 (SUTURE) ×3
SUT MON AB 3-0 SH27 (SUTURE) ×1 IMPLANT
SUT VIC AB 0 CT1 36 (SUTURE) ×3 IMPLANT
SUTURE FIBERWR #5 38 CONV NDL (SUTURE) ×4 IMPLANT
TOWEL OR 17X26 10 PK STRL BLUE (TOWEL DISPOSABLE) ×3 IMPLANT
WATER STERILE IRR 1000ML POUR (IV SOLUTION) ×6 IMPLANT
YANKAUER SUCT BULB TIP NO VENT (SUCTIONS) ×2 IMPLANT

## 2019-01-12 NOTE — Anesthesia Procedure Notes (Addendum)
Anesthesia Regional Block: Interscalene brachial plexus block   Pre-Anesthetic Checklist: ,, timeout performed, Correct Patient, Correct Site, Correct Laterality, Correct Procedure, Correct Position, site marked, Risks and benefits discussed,  Surgical consent,  Pre-op evaluation,  At surgeon's request and post-op pain management  Laterality: Left  Prep: Maximum Sterile Barrier Precautions used, chloraprep       Needles:  Injection technique: Single-shot  Needle Type: Echogenic Stimulator Needle     Needle Length: 5cm  Needle Gauge: 22     Additional Needles:   Procedures:,,,, ultrasound used (permanent image in chart),,,,  Narrative:  Start time: 01/12/2019 8:01 AM End time: 01/12/2019 8:11 AM Injection made incrementally with aspirations every 5 mL.  Performed by: Personally  Anesthesiologist: Freddrick March, MD  Additional Notes: Monitors applied. No increased pain on injection. No increased resistance to injection. Injection made in 5cc increments. Good needle visualization. Patient tolerated procedure well.

## 2019-01-12 NOTE — Progress Notes (Signed)
No informed consent in order set.   Called/contacted  Dr. Griffin Basil for consent.  RequestedReverse Revision Left shoulder arthroplasy.

## 2019-01-12 NOTE — Anesthesia Procedure Notes (Signed)
Procedure Name: Intubation Date/Time: 01/12/2019 8:40 AM Performed by: Anne Fu, CRNA Pre-anesthesia Checklist: Patient identified, Emergency Drugs available, Suction available, Patient being monitored and Timeout performed Patient Re-evaluated:Patient Re-evaluated prior to induction Oxygen Delivery Method: Circle system utilized Preoxygenation: Pre-oxygenation with 100% oxygen Induction Type: IV induction Ventilation: Mask ventilation without difficulty Laryngoscope Size: Mac and 3 Grade View: Grade I Tube type: Oral Tube size: 7.5 mm Number of attempts: 1 Airway Equipment and Method: Bougie stylet and Video-laryngoscopy Placement Confirmation: ETT inserted through vocal cords under direct vision,  positive ETCO2 and breath sounds checked- equal and bilateral Secured at: 19 cm Tube secured with: Tape Dental Injury: Teeth and Oropharynx as per pre-operative assessment  Difficulty Due To: Difficult Airway- due to anterior larynx and Difficult Airway- due to limited oral opening Comments: Reviewed noted from earlier anesthetic elected to proceed with glidescope X 1 with Grade I view, noted small glottic opening 7.0 ETT passed with slight difficulty.  No noted trama present post intubation, patient tolerated procedure well.

## 2019-01-12 NOTE — Progress Notes (Signed)
Pharmacy Antibiotic Note  Brenda Stephens is a 78 y.o. female admitted on 01/12/2019 for  Revision reverse total shoulder arthroplasty, removal of hardware.  Pharmacy has been consulted for vancomycin dosing for surgical prophylaxis. Vanc 1 gm given preop at 0840 am.  Vanc powder 1 gm used intra-operatively at 1126.   Plan: Vancomycin 750 mg IV q12 Doxy on DC home  Height: 5\' 3"  (160 cm) Weight: 191 lb 4 oz (86.7 kg) IBW/kg (Calculated) : 52.4  Temp (24hrs), Avg:98.4 F (36.9 C), Min:98.4 F (36.9 C), Max:98.4 F (36.9 C)  No results for input(s): WBC, CREATININE, LATICACIDVEN, VANCOTROUGH, VANCOPEAK, VANCORANDOM, GENTTROUGH, GENTPEAK, GENTRANDOM, TOBRATROUGH, TOBRAPEAK, TOBRARND, AMIKACINPEAK, AMIKACINTROU, AMIKACIN in the last 168 hours.  Estimated Creatinine Clearance: 46.9 mL/min (A) (by C-G formula based on SCr of 1.05 mg/dL (H)).    Allergies  Allergen Reactions  . Latex Rash  . Aspirin Other (See Comments)    GI bleeding  . Oxycodone-Acetaminophen Nausea Only  . Sulfa Antibiotics Nausea Only  . Sulfamethoxazole-Trimethoprim Nausea Only   Thank you for allowing pharmacy to be a part of this patient's care.  Eudelia Bunch, Pharm.D 01/12/2019 12:21 PM

## 2019-01-12 NOTE — Op Note (Signed)
Orthopaedic Surgery Operative Note (CSN: [id number])  HERTA HINK  08-07-1940 Date of Surgery: 01/12/2019   Diagnoses:  Failed left total shoulder arthroplasty  Procedure: Revision reverse total Shoulder Arthroplasty Removal of hardware   Operative Finding Successful completion of planned procedure.  Patient had a well fixed stem and well fixed glenoid with her rotator cuff had failed in regards to the supraspinatus and infraspinatus.  Well fixed and in the setting of poor bone quality and proximal resorption led to intraoperative fracturing of the tuberosity section of the proximal humerus due to the poor bone quality and the need to remove the proximally ingrown stem.  We felt that due to the significant superior migration articulation with the acromion and the tight shoulder patient would likely need a large resection of proximal bone regardless and her eventual resection turned out to be near the level that we had needed.  There is significant soft tissue tension and we did a near full capsulotomy in order to mobilize the joint.  We stayed hard on bone we were not able to palpate the axillary nerve during this procedure as she had 2 previous surgeries.  Patient's implants were somewhat atypical the humerus was relatively normal with a proximal ingrowth Stryker stem however the glenoid had a typical 2 peg configuration with the inferior peg and inferior.  This led to plenty of bone available to place our reverse arthroplasty baseplate.  Great fixation with our baseplate.  The humerus had a pedestal distally and this required [place] to perforate through that and ream away some of the bone using our instruments and were able to get good fit with a partially coated revive stem.  Overall were very happy with our fixation.  The greater tuberosity fragment was under a lot of tension but we were able to get it generally approximated as we wanted to use this to hopefully get the patient teres minor function  as this was still intact.  We did not on exam under fluoroscopy and with palpation we will that the tuberosity was impinging and thus left in place even though and somewhat atypical appearance.  Implants: 11 partially coated revive stem, standard proximal body, high offset tray turned to 12:00 with a 6 mm poly-.  Humerus had a 25 standard baseplate with a standard 36 glenosphere 2 locking screws and a 40 mm x 6.5 mm center screw.  Post-operative plan: The patient will be NWB in sling.  The patient will be admitted overnight.  DVT prophylaxis not indicated in isolated upper extremity surgery patient with no specific risks factors.  Pain control with PRN pain medication preferring oral medicines.  Follow up plan will be scheduled in approximately 7 days for incision check and XR.  Physical therapy to start at 4 weeks.  She will be maintained on doxycycline for prophylaxis in the setting of one stage surgery.  Post-Op Diagnosis: Same Surgeons:Primary: Hiram Gash, MD Assistants:Brandon Lynnell Jude Location: [facility] ROOM 06 Anesthesia: General plus Exparel interscalene Antibiotics: Vancomycin IV preop, vancomycin 1054m locally Tourniquet time: None Estimated Blood Loss: 1165Complications: None Specimens: 3 for culture with a 329week-old for P acnes. Implants: Implant Name Type Inv. Item Serial No. Manufacturer Lot No. LRB No. Used Action  BASEPLATE GLENOSPHERE 253ZSSTD - [id number] BASEPLATE GLENOSPHERE 249EESTD [id number]  Left 1 Implanted  SCREW BONE 6.5X40 SM - [id number] SCREW BONE 6.5X40 SM  [employer]  Left 1 Implanted  SCREW PERIPHERAL 5.0X34 - [id number] SCREW  PERIPHERAL 5.0X34  [employer]  Left 1 Implanted  SCREW PERIPHERAL 30 - [id number] Screw SCREW PERIPHERAL 30  [employer]  Left 1 Implanted  GLENOSPHERE REV SHOULDER 36 - [id number] Joint GLENOSPHERE REV SHOULDER 36 [id number] [employer]  Left 1 Implanted  STEM PRTL DISTAL 11 SHOULDER -  [id number] Miscellaneous STEM PRTL DISTAL 11 SHOULDER [id number] [employer]  Left 1 Implanted  PROXIMAL BODY PTC 11 132.5D - [id number] Spacer PROXIMAL BODY PTC 11 132.5D [id number] [employer]  Left 1 Implanted  SCREW ASSEMBLY COCR TYPE 0 - [id number] Screw SCREW ASSEMBLY COCR TYPE 0 [id number] [employer]  Left 1 Implanted  CAP LOCKING COCR - [id number] Cap CAP LOCKING COCR [id number] [employer]  Left 1 Implanted  IMPLANT REVERSE SHOULDER 0X3.5 - [id number] Shoulder IMPLANT REVERSE SHOULDER 0X3.5 [id number] [employer]  Left 1 Implanted  INSERT HUMERAL 36X6MM 12.5DEG - [id number] Insert INSERT HUMERAL 36X6MM 12.5DEG [id number] [employer]  Left 1 Implanted    Indications for Surgery:   KANIJAH GROSECLOSE is a 78 y.o. female with previous left total arthroplasty and failed rotator cuff with significant pain and dysfunction..  Benefits and risks of operative and nonoperative management were discussed prior to surgery with patient/guardian(s) and informed consent form was completed.  Infection and need for further surgery were discussed as was prosthetic stability and cuff issues.  He did perform a laboratory work-up for infection however the patient declined an aspiration and wanted to be treated as a 1 stage regardless.  We felt that with her low suspicion of infection felt that this was appropriately obtained intraoperative cultures instead.  We additionally specifically discussed risks of axillary nerve injury, infection, periprosthetic fracture, continued pain and longevity of implants prior to beginning procedure.      Procedure:   The patient was identified in the preoperative holding area where the surgical site was marked. Block placed by anesthesia with exparel.  The patient was taken to the OR where a procedural timeout was called and the above noted anesthesia was induced.  The patient was positioned beachchair on allen table with spider arm positioner.  Preoperative  antibiotics were dosed.  The patient's left shoulder was prepped and draped in the usual sterile fashion.  A second preoperative timeout was called.      We began by utilizing the previously made deltopectoral incision and there was a incision perpendicular to this from her mini open rotator cuff previously that are both well-healed.  We dissected through skin sharply achieving hemostasis we progressed.  Able to identify the cephalic vein and protected and identified the deltopectoral interval without issue.  At that point we noted that there is significant contracture of all the tissues and proximal migration of the humeral head really limited our exposure.  We are able to release the superior one half of the pectoralis and at that point extend our approach superiorly.  We went along the bicipital groove and were able to open the bicipital groove without issue.  The humeral head was articulating with the acromion at this point we had to perform a release of the subscapularis remnant in the anterior portion of the humerus.  Were able to peel this around staying on bone to avoid neurovascular structures and peel to the inferior posterior aspect of the humerus.  This point were able to visualize the humeral implant however it was still under quite a lot of tension.  We dissected the subdeltoid space and freed adhesions.  There is no superior cuff  in regards to supraspinatus infraspinatus but we did see some teres minor tissue that was still intact.  Tuberosity fragments are still intact at this point however there is clearly very soft bone.  We placed retractors around and were not able to deliver the humeral head implant due to tension of the tissues.  At that point this engaged the humeral head from the stem and were able to appropriate space so that we could dislocate the implant.  This point the stem appears well fixed and there is no sign of infection at all.  We used a series of osteotomes to try and break  the bond between the proximal porous coat and the humerus.  Her bone quality was extremely poor proximally and there was significant fracturing of the tuberosities as these were well fixed and the stems of the implant.  We felt that this was reasonable in the setting of her superior migration and the need to resect this bone regardless.  We plan for a distal fit stem into the diaphysis from the beginning of the case thus we had this option.  The stem was very well fixed but we were able to remove it after releasing bone.  No osteotomy was required of the proximal humerus.  At that point we remove the stem and examined the humerus.  There was some small longitudinal splitting of the proximal aspect of the humeral canal however distally it clearly was intact.  We then irrigated this copiously with 1-1/2 L of saline and proceeded to expose the glenoid.  The glenoid was easily visualized and we performed a superior release of the remaining subscapularis tissue as well as a release of the anterior aspect of the glenoid with blunt instruments to expose the anterior aspect of the glenoid so we can get a finger around the anterior portion of the glenoid and scapula.  We then identified that the left implant appeared to be reasonably fixed however were able to withdraw by using an osteotome and a Coker without much issue en bloc.  At that point we identified that the implant was somewhat atypical as it had a center superior and center inferior peg but the inferior peg was off axis of the implant and inferiorly into the scapular neck in a way that had not seen on previous implants.  This conveniently left [place] with good vault bone centrally and we were able to plan around this without issue.  We removed all remnant cement and took multiple specimens both of the humerus and the glenoid as we proceeded.  The glenoid drill guide was placed and used to drill a guide pin in the center, inferior position. The glenoid face  was then reamed concentrically over the guide wire.  We felt that the previously placed peg holes were relatively large and free of any sign of infection so we used some of the cancellus bone from the tuberosity fragments to graft these holes and re-reamed.  The center hole was drilled over the guidepin in a near anatomic angle of version. Next the glenoid vault was drilled back to a depth of 40 mm.  We tapped and then placed a 33m size baseplate with 250mlateralization was selected with a 6.5 mm x 40 mm length central screw.  The base plate was screwed into the glenoid vault obtaining secure fixation. We next placed superior and inferior locking screws for additional fixation.  Next a 36 mm glenosphere was selected and impacted onto the baseplate. The center screw  was tightened.   We then repositioned the arm to give access to the humeral shaft fragment.    We identified that there was a pedestal bone at the inferior aspect of the previous stem and this was holding [place] up.  We used a series of drills from the glenoid implant set to perforate this pedestal and then used the tap to open the pedestal so that we had good cortical fixation of her diaphyseal fit stem.    We broached with the Revive stem implants  starting with a size 9 reamer and reaming up to 11 which obtained an appropriate fit.  The proximal body was sized separately and attached to trial and achieve a stable articulation.   We had good fixation and a reasonably tight joint but we felt that putting the stem more distal could risk fracture.   We trialed with multiple size tray and polyethylene options and selected a 0 high which provided good stability and range of motion without excess soft tissue tension. The offset was dialed in to match the normal anatomy. The shoulder was trialed.  There was good ROM in all planes and the shoulder was stable with no inferior translation.   We then mobilized her tuberosities again and placed the anterior deep  limbs of the 4 #5 fiber wires around the stem.  1 of these was tied down fixing the greater tuberosity in place after bone graft harvest from the humeral head component was placed underneath.  A +0 high offset tray was selected and impacted onto the stem.   A 36+6 polyethylene liner was impacted onto the stem.  The joint was reduced and thoroughly irrigated with pulsatile lavage.  There really was very little lesser tuberosity left at this point but we felt that if we could get the remnant of the greater tuberosity to heal at all the patient would have better function.  We did repair this fragment to the stem using cerclage sutures in the form of #5 FiberWire in a had a somewhat atypical appearance but he moved as a unit and we were happy with the overall reduction.    There was essentially no subscapularis tissue that was able to be repaired secondary to the significant retraction and tearing of the previous tissue.  Essentially the patient had repair of her teres minor and some bone and no other cuff that was able to be repaired.  Consider reasonable risk of dislocation but her implant stability felt remarkable at the time of surgery.  This was checked on fluoroscopy confirming our position.  We irrigated copiously at this point with 3 L of normal saline.  Hemostasis was obtained. The deltopectoral interval was reapproximated with #1 Ethibond. The subcutaneous tissues were closed with 3-0 Vicryl and the skin was closed with running monocryl.     The wounds were cleaned and dried and an Aquacel dressing was placed. The drapes taken down. The arm was placed into sling with abduction pillow. Patient was awakened, extubated, and transferred to the recovery room in stable condition. There were no intraoperative complications. The sponge, needle, and attention counts were correct at the end of the case.    Joya Gaskins, OPA-C, present and scrubbed throughout the case, critical for completion in a timely fashion,  and for retraction, instrumentation, closure.

## 2019-01-12 NOTE — H&P (Signed)
PREOPERATIVE H&P  Chief Complaint: orthopedic implant complications left shoulder  HPI: Brenda Stephens is a 78 y.o. female who presents for preoperative history and physical with a diagnosis of orthopedic implant complications left shoulder. Symptoms are rated as moderate to severe, and have been worsening.  This is significantly impairing activities of daily living.  Please see my clinic note for full details on this patient's care.  She has elected for surgical management.   Past Medical History:  Diagnosis Date  . Allergy    latex  . Anemia   . Anxiety   . Arthritis   . Cancer ([place]) 01/2017   right breast cancer  . Depression   . Dyslipidemia   . Eustachian tube dysfunction   . GERD (gastroesophageal reflux disease)   . Hx of cardiovascular stress test    Myoview 10/13:  apical thinning, EF 72%, no ischemia  . Hx of colonic polyps    Dr Watt Climes  . Hypertension   . Hypothyroidism   . Other abnormal glucose    Past Surgical History:  Procedure Laterality Date  . ABDOMINAL HYSTERECTOMY     BSO for uterine fibroids  . ACHILLES TENDON SURGERY Right 10/07/2012   Procedure: RIGHT REPAIR RUPTURE TENDON PRIMARY OPEN/PERCUTANEOUS ;  Surgeon: Ninetta Lights, MD;  Location: [place];  Service: Orthopedics;  Laterality: Right;  . APPENDECTOMY    . BREAST LUMPECTOMY WITH RADIOACTIVE SEED AND SENTINEL LYMPH NODE BIOPSY Right 03/16/2017   Procedure: RIGHT BREAST LUMPECTOMY WITH RADIOACTIVE SEED AND RIGHT AXILLARY SENTINEL  NODE BIOPSY ERAS PATHWAY;  Surgeon: [name], MD;  Location: [place];  Service: General;  Laterality: Right;  PECTORAL BLOCK  . CATARACT EXTRACTION, BILATERAL    . CHOLECYSTECTOMY    . COLONOSCOPY W/ POLYPECTOMY     Dr Watt Climes  . EAR CYST EXCISION Right 10/07/2012   Procedure: EXCISION BONE CYST BENIGN TUMOR [name] ;  Surgeon: Ninetta Lights, MD;  Location: [place];  Service: Orthopedics;  Laterality:  Right;  . EXPLORATORY LAPAROTOMY    . SINUS SURGERY WITH INSTATRAK    . TOTAL SHOULDER REPLACEMENT     Right shoulder  . 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 TUBAL LIGATION     Social History   Socioeconomic History  . Marital status: Married    Spouse name: Not on file  . Number of children: 1  . Years of education: Not on file  . Highest education level: Not on file  Occupational History  . Not on file  Social Needs  . Financial resource strain: Not on file  . Food insecurity    Worry: Not on file    Inability: Not on file  . Transportation needs    Medical: Not on file    Non-medical: Not on file  Tobacco Use  . Smoking status: Former Smoker    Packs/day: 0.50    Years: 6.00    Pack years: 3.00    Types: Cigarettes    Quit date: 06/30/1962    Years since quitting: 56.5  . Smokeless tobacco: Never Used  . Tobacco comment: Quit age 69  Substance and Sexual Activity  . Alcohol use: Yes    Alcohol/week: 3.0 standard drinks    Types: 3 Glasses of wine per week    Comment: wine  : < 3 glasses weekly  . Drug use: No  . Sexual activity: Yes    Birth control/protection: Post-menopausal,  Surgical  Lifestyle  . Physical activity    Days per week: Not on file    Minutes per session: Not on file  . Stress: Not on file  Relationships  . Social Herbalist on phone: Not on file    Gets together: Not on file    Attends religious service: Not on file    Active member of club or organization: Not on file    Attends meetings of clubs or organizations: Not on file    Relationship status: Not on file  Other Topics Concern  . Not on file  Social History Narrative   Lives husband and daughter lives with her.    Family History  Problem Relation Age of Onset  . Hiatal hernia Mother   . Heart attack Mother 32  . Heart disease Father        CAD @ autopsy  . Cancer Father        renal cancer  . Heart attack Brother 65       fatal  . Diabetes Neg Hx   .  Stroke Neg Hx    Allergies  Allergen Reactions  . Latex Rash  . Aspirin Other (See Comments)    GI bleeding  . Oxycodone-Acetaminophen Nausea Only  . Sulfa Antibiotics Nausea Only  . Sulfamethoxazole-Trimethoprim Nausea Only   Prior to Admission medications   Medication Sig Start Date End Date Taking? Authorizing Provider  atorvastatin (LIPITOR) 20 MG tablet TAKE 1 TABLET BY MOUTH AT BEDTIME Patient taking differently: Take 20 mg by mouth daily.  05/19/13  Yes Hendricks Limes, MD  diclofenac sodium (VOLTAREN) 1 % GEL Apply 1 application topically 2 (two) times a day.   Yes [provider]  dicyclomine (BENTYL) 10 MG capsule Take 10 mg by mouth daily.    Yes [provider]  esomeprazole (NEXIUM 24HR) 20 MG capsule Take 40 mg by mouth daily.    Yes [provider]  meloxicam (MOBIC) 15 MG tablet Take 15 mg by mouth daily.   Yes [provider]  metoprolol succinate (TOPROL-XL) 50 MG 24 hr tablet Take 1 tablet (50 mg total) by mouth daily. 07/19/12  Yes Hendricks Limes, MD  Multiple Vitamins-Minerals (CENTRUM SILVER 50+WOMEN) TABS Take 1 tablet by mouth daily.   Yes [provider]  oxybutynin (DITROPAN) 5 MG tablet Take 5 mg by mouth 2 (two) times a day. 12/21/18  Yes [provider]  Polyethyl Glycol-Propyl Glycol (SYSTANE) 0.4-0.3 % GEL ophthalmic gel Place 1 application into both eyes 3 (three) times daily as needed (for dry eyes).   Yes [provider]  pseudoephedrine (SUDAFED) 30 MG/5ML syrup Take 15 mg by mouth every 6 (six) hours as needed for congestion.   Yes [provider]  Saline (AYR SALINE NASAL DROPS) 0.65 % (SOLN) SOLN Place 1 spray into the nose 3 (three) times daily as needed (for congestion).    Yes [provider]  spironolactone-hydrochlorothiazide (ALDACTAZIDE) 25-25 MG per tablet Take 1 tablet by mouth daily. 07/19/12  Yes Hendricks Limes, MD  SYNTHROID 50 MCG tablet PT NEEDS COMPLETE  PHYSICAL.  TAKE 1 TABLET (50MCG TOTAL) BY MOUTH DAILY. Patient taking differently: Take 50 mcg by mouth daily before breakfast.  04/07/14  Yes Hendricks Limes, MD  tamoxifen (NOLVADEX) 20 MG tablet TAKE 1 TABLET BY MOUTH EVERY DAY Patient taking differently: Take 20 mg by mouth daily.  12/07/18  Yes [name], [name], MD  venlafaxine  XR (EFFEXOR-XR) 150 MG 24 hr capsule TAKE 1 CAPSULE (150 MG TOTAL) BY MOUTH DAILY. Patient taking differently: Take 150 mg by mouth daily with breakfast.  12/14/12  Yes Hendricks Limes, MD  verapamil (CALAN-SR) 240 MG CR tablet TAKE 1 TABLET (240 MG TOTAL) BY MOUTH AT BEDTIME. Patient taking differently: Take 240 mg by mouth daily.  02/27/14  Yes Hendricks Limes, MD  zolpidem (AMBIEN) 5 MG tablet Take 5 mg by mouth at bedtime.  04/29/13  Yes [provider]     Positive ROS: All other systems have been reviewed and were otherwise negative with the exception of those mentioned in the HPI and as above.  Physical Exam: General: Alert, no acute distress Cardiovascular: No pedal edema Respiratory: No cyanosis, no use of accessory musculature GI: No organomegaly, abdomen is soft and non-tender Skin: No lesions in the area of chief complaint Neurologic: Sensation intact distally Psychiatric: Patient is competent for consent with normal mood and affect Lymphatic: No axillary or cervical lymphadenopathy  MUSCULOSKELETAL: L shoulder healed incision, ROM limited, cuff 3/5  Assessment: orthopedic implant complications left shoulder  Plan: Plan for Procedure(s): REVISION TOTAL SHOULDER TO REVERSE TOTAL SHOULDER  The risks benefits and alternatives were discussed with the patient including but not limited to the risks of nonoperative treatment, versus surgical intervention including infection, bleeding, nerve injury,  blood clots, cardiopulmonary complications, morbidity, mortality, among others, and they were willing to proceed.   We additionally  specifically discussed risks of axillary nerve injury, infection, periprosthetic fracture, continued pain and longevity of implants prior to beginning procedure.    Patient had a negative lab workup for infection but declined aspiration.  She wanted a 1 stage type procedure to avoid a second surgery regardless at her age.  With her negative labs and history we felt this was reasonable.  Intraoperative cultures to be obtained.  All discussed with patient.  Hiram Gash, MD  01/12/2019 8:20 AM

## 2019-01-12 NOTE — Transfer of Care (Signed)
Immediate Anesthesia Transfer of Care Note  Patient: Brenda Stephens  Procedure(s) Performed: Procedure(s): REVISION TOTAL SHOULDER TO REVERSE TOTAL SHOULDER (Left)  Patient Location: PACU  Anesthesia Type:General  Level of Consciousness:  sedated, patient cooperative and responds to stimulation  Airway & Oxygen Therapy:Patient Spontanous Breathing and Patient connected to face mask oxgen  Post-op Assessment:  Report given to PACU RN and Post -op Vital signs reviewed and stable  Post vital signs:  Reviewed and stable  Last Vitals:  Vitals:   01/12/19 0634  BP: (!) 179/86  Pulse: 87  Resp: 20  Temp: 36.9 C  SpO2: 43%    Complications: No apparent anesthesia complications

## 2019-01-13 ENCOUNTER — Encounter: Payer: MEDICARE | Admitting: Obstetrics & Gynecology

## 2019-01-13 MED ORDER — OXYCODONE HCL 5 MG PO TABS: ORAL_TABLET | ORAL | 0 refills | Status: AC

## 2019-01-13 MED ORDER — ACETAMINOPHEN 500 MG PO TABS: 1000.0000 mg | ORAL_TABLET | Freq: Three times a day (TID) | ORAL | 0 refills | Status: AC

## 2019-01-13 MED ORDER — DOXYCYCLINE HYCLATE 50 MG PO CAPS: 100.0000 mg | ORAL_CAPSULE | Freq: Two times a day (BID) | ORAL | 0 refills | Status: AC

## 2019-01-13 MED ORDER — ONDANSETRON HCL 4 MG PO TABS: 4.0000 mg | ORAL_TABLET | Freq: Three times a day (TID) | ORAL | 1 refills | Status: AC | PRN

## 2019-01-13 MED ORDER — CELECOXIB 200 MG PO CAPS: 200.0000 mg | ORAL_CAPSULE | Freq: Two times a day (BID) | ORAL | 1 refills | Status: DC

## 2019-01-13 MED ORDER — SPIRONOLACTONE 25 MG PO TABS
25.0000 mg | ORAL_TABLET | Freq: Every day | ORAL | Status: DC
  Administered 2019-01-13: 25 mg via ORAL
  Filled 2019-01-13: qty 1

## 2019-01-13 MED ORDER — HYDROCHLOROTHIAZIDE 25 MG PO TABS
25.0000 mg | ORAL_TABLET | Freq: Every day | ORAL | Status: DC
  Administered 2019-01-13: 08:00:00 25 mg via ORAL
  Filled 2019-01-13: qty 1

## 2019-01-13 NOTE — Discharge Summary (Signed)
Patient ID: Brenda Stephens MRN: [id number] DOB/AGE: July 23, 1940 78 y.o.  Admit date: 01/12/2019 Discharge date: 01/13/2019  Admission Diagnoses:L failed total shoulder arthroplasty  Discharge Diagnoses:  Active Problems:   Rotator cuff arthropathy, left   Past Medical History:  Diagnosis Date  . Allergy    latex  . Anemia   . Anxiety   . Arthritis   . Cancer ([place]) 01/2017   right breast cancer  . Depression   . Dyslipidemia   . Eustachian tube dysfunction   . GERD (gastroesophageal reflux disease)   . Hx of cardiovascular stress test    Myoview 10/13:  apical thinning, EF 72%, no ischemia  . Hx of colonic polyps    Dr Watt Climes  . Hypertension   . Hypothyroidism   . Other abnormal glucose      Procedures Performed: Left revision reverse total shoulder arthroplasty  Discharged Condition: good  Hospital Course: Patient brought in as an outpatient for surgery.  Tolerated procedure well.  Was kept for monitoring overnight for pain control and medical monitoring postop and was found to be stable for DC home the morning after surgery.  Patient was instructed on specific activity restrictions and all questions were answered.  Cultures obtained for 1 stage surgery and pending at time of DC with hold for 3 weeks for C. Acnes.  Consults: None  Significant Diagnostic Studies: No additional pertinent studies  Treatments: Surgery  Discharge Exam:  Dressing CDI and sling well fitting,  full and painless ROM throughout hand with DPC of 0.  Axillary nerve sensation/motor altered in setting of block and unable to be fully tested.  Distal motor and sensory altered in setting of block.   Disposition: Discharge disposition: 01-Home or Self Care       Discharge Instructions    Call MD for:  persistant nausea and vomiting   Complete by: As directed    Call MD for:  redness, tenderness, or signs of infection (pain, swelling, redness, odor or green/yellow discharge around  incision site)   Complete by: As directed    Call MD for:  severe uncontrolled pain   Complete by: As directed    Diet - low sodium heart healthy   Complete by: As directed    Discharge instructions   Complete by: As directed    Ophelia Charter MD, MPH [place]. [address] [phone] (tel)   [phone] (fax)   [place] may leave the operative dressing in place until your follow-up appointment. KEEP THE INCISIONS CLEAN AND DRY. Use the provided ice machine or Ice packs as often as possible for the first 3-4 days, then as needed for pain relief.  Keep a layer of cloth or a shirt between your skin and the cooling unit to prevent frost bite as it can get very cold. You may shower on Post-Op Day #2. The dressing is water resistant but do not scrub it as it may start to peel up.  You may remove the sling for showering, but keep a water resistant pillow under the arm to keep both the elbow and shoulder away from the body (mimicking the abduction sling). Gently pat the area dry. Do not soak the shoulder in water. Do not go swimming in the pool or ocean until your sutures are removed.  EXERCISES Wear the sling at all times except when doing your exercises. You may remove the sling for  showering, but keep the arm across the chest or in a secondary sling.   Accidental/Purposeful External Rotation and shoulder flexion (reaching behind you) is to be avoided at all costs for the first month. Please perform the exercises:   Elbow / Hand / Wrist  Range of Motion Exercises  FOLLOW-UP If you develop a Fever (>101.5), Redness or Drainage from the surgical incision site, please call our office to arrange for an evaluation. Please call the office to schedule a follow-up appointment for a wound check, 7-10 days post-operatively.    IF YOU HAVE ANY QUESTIONS, PLEASE FEEL FREE TO CALL OUR OFFICE.    HELPFUL INFORMATION  Your arm will be in a sling following surgery. You will be in this sling for the next 3-4 weeks.  I will let you know the exact duration at your follow-up visit.  You may be more comfortable sleeping in a semi-seated position the first few nights following surgery.  Keep a pillow propped under the elbow and forearm for comfort.  If you have a recliner type of chair it might be beneficial.  If not that is fine too, but it would be helpful to sleep propped up with pillows behind your operated shoulder as well under your elbow and forearm.  This will reduce pulling on the suture lines.  We suggest you use the pain medication the first night prior to going to bed, in order to ease any pain when the anesthesia wears off. You should avoid taking pain medications on an empty stomach as it will make you nauseous.  Do not drink alcoholic beverages or take illicit drugs when taking pain medications.  In most states it is against the law to drive while your arm is in a sling. And certainly against the law to drive while taking narcotics.  You may return to work/school in the next couple of days when you feel up to it. Desk work and typing in the sling is fine.  When dressing, put your operative arm in the sleeve first.  When getting undressed, take your operative arm out last.  Loose fitting, button-down shirts are recommended.  Pain medication may make you constipated.  Below are a few solutions to try in this order: Decrease the amount of pain medication if you aren't having pain. Drink lots of decaffeinated fluids. Drink prune juice and/or each dried prunes  If the first 3 don't work start with additional solutions Take Colace - an over-the-counter stool softener Take Senokot - an over-the-counter laxative Take Miralax - a stronger over-the-counter laxative   Increase activity slowly   Complete by: As directed      Allergies as of 01/13/2019      Reactions   Latex Rash    Aspirin Other (See Comments)   GI bleeding   Oxycodone-acetaminophen Nausea Only   Sulfa Antibiotics Nausea Only   Sulfamethoxazole-trimethoprim Nausea Only      Medication List    STOP taking these medications   meloxicam 15 MG tablet Commonly known as: MOBIC     TAKE these medications   acetaminophen 500 MG tablet Commonly known as: TYLENOL Take 2 tablets (1,000 mg total) by mouth every 8 (eight) hours for 14 days.   atorvastatin 20 MG tablet Commonly known as: LIPITOR TAKE 1 TABLET BY MOUTH AT BEDTIME What changed: when to take this   [place] Saline Nasal Drops 0.65 % (Soln) Soln Generic drug: Saline Place 1 spray into the nose 3 (three) times daily as needed (for  congestion).   celecoxib 200 MG capsule Commonly known as: CeleBREX Take 1 capsule (200 mg total) by mouth 2 (two) times daily.   Centrum Silver 50+Women Tabs Take 1 tablet by mouth daily.   diclofenac sodium 1 % Gel Commonly known as: VOLTAREN Apply 1 application topically 2 (two) times a day.   dicyclomine 10 MG capsule Commonly known as: BENTYL Take 10 mg by mouth daily.   doxycycline 50 MG capsule Commonly known as: VIBRAMYCIN Take 2 capsules (100 mg total) by mouth 2 (two) times daily.   metoprolol succinate 50 MG 24 hr tablet Commonly known as: TOPROL-XL Take 1 tablet (50 mg total) by mouth daily.   NexIUM 24HR 20 MG capsule Generic drug: esomeprazole Take 40 mg by mouth daily.   ondansetron 4 MG tablet Commonly known as: Zofran Take 1 tablet (4 mg total) by mouth every 8 (eight) hours as needed for up to 7 days for nausea or vomiting.   oxybutynin 5 MG tablet Commonly known as: DITROPAN Take 5 mg by mouth 2 (two) times a day.   oxyCODONE 5 MG immediate release tablet Commonly known as: Oxy IR/ROXICODONE Take 1-2 pills every 6 hrs as needed for pain, no more than 6 per day   pseudoephedrine 30 MG/5ML syrup Commonly known as: SUDAFED Take 15 mg by mouth every 6 (six) hours as needed  for congestion.   spironolactone-hydrochlorothiazide 25-25 MG tablet Commonly known as: ALDACTAZIDE Take 1 tablet by mouth daily.   Synthroid 50 MCG tablet Generic drug: levothyroxine PT NEEDS COMPLETE PHYSICAL.  TAKE 1 TABLET (50MCG TOTAL) BY MOUTH DAILY. What changed:   how much to take  how to take this  when to take this  additional instructions   Systane 0.4-0.3 % Gel ophthalmic gel Generic drug: Polyethyl Glycol-Propyl Glycol Place 1 application into both eyes 3 (three) times daily as needed (for dry eyes).   tamoxifen 20 MG tablet Commonly known as: NOLVADEX TAKE 1 TABLET BY MOUTH EVERY DAY   venlafaxine XR 150 MG 24 hr capsule Commonly known as: EFFEXOR-XR TAKE 1 CAPSULE (150 MG TOTAL) BY MOUTH DAILY. What changed: See the new instructions.   verapamil 240 MG CR tablet Commonly known as: CALAN-SR TAKE 1 TABLET (240 MG TOTAL) BY MOUTH AT BEDTIME. What changed:   how much to take  how to take this  when to take this  additional instructions   zolpidem 5 MG tablet Commonly known as: AMBIEN Take 5 mg by mouth at bedtime.

## 2019-01-13 NOTE — Progress Notes (Signed)
   01/13/19 1109  OT Visit Information  Last OT Received On 01/13/19  Assistance Needed +1  History of Present Illness L RTSA, h/o L failed shoulder. PMH:  anxiety, depression and breast CA  Precautions  Precautions Fall;Shoulder  Type of Shoulder Precautions sling on at all times except for adls, exercise (elbow to finger ROM only, no shoulder ROM).    Shoulder Interventions Shoulder abduction pillow  Precaution Booklet Issued Yes (comment)  Pain Assessment  Faces Pain Scale 4  Pain Location L shoulder, starting to get some feeling  Pain Descriptors / Indicators Sore  Pain Intervention(s) Limited activity within patient's tolerance  Cognition  Arousal/Alertness Awake/alert  Behavior During Therapy [facility] for tasks assessed/performed  Overall Cognitive Status Within Functional Limits for tasks assessed  ADL  General ADL Comments husband was present and donned sling with cues.  Reviewed protocol and ice machine.  Husband did not have questions  Restrictions  LUE Weight Bearing NWB  Transfers  Sit to Stand Min guard  General transfer comment instructed husband to stand on R side and to use gait belt when pt is ambulating  OT - End of Session  Activity Tolerance Patient tolerated treatment well  Patient left in chair;with call bell/phone within reach  OT Assessment/Plan  OT Visit Diagnosis Muscle weakness (generalized) (M62.81);Pain  Pain - Right/Left Left  Pain - part of body Shoulder  OT Frequency (ACUTE ONLY) Min 2X/week  Follow Up Recommendations Follow surgeon's recommendation for DC plan and follow-up therapies  OT Equipment None recommended by OT  AM-PAC OT "6 Clicks" Daily Activity Outcome Measure (Version 2)  Help from another person eating meals? 3  Help from another person taking care of personal grooming? 3  Help from another person toileting, which includes using toliet, bedpan, or urinal? 2  Help from another person bathing (including washing, rinsing, drying)? 2   Help from another person to put on and taking off regular upper body clothing? 2  Help from another person to put on and taking off regular lower body clothing? 2  6 Click Score 14  OT Goal Progression  Progress towards OT goals Goals met/education completed, patient discharged from OT  ADL Goals  Additional ADL Goal #1 husband will don sling with supervision and verbalize understanding of protocol, precautions and how to don ice machine sleeve  OT Time Calculation  OT Start Time (ACUTE ONLY) 1042  OT Stop Time (ACUTE ONLY) 1056  OT Time Calculation (min) 14 min  OT General Charges  $OT Visit 1 Visit  OT Treatments  $Self Care/Home Management  8-22 mins  Lesle Chris, OTR/L Acute Rehabilitation Services [phone] [place] pager [phone] office 01/13/2019

## 2019-01-13 NOTE — Evaluation (Signed)
Occupational Therapy Evaluation Patient Details Name: Brenda Stephens MRN: [id number] DOB: [birth year] Today's Date: 01/13/2019    History of Present Illness L RTSA, h/o L failed shoulder. PMH:  anxiety, depression and breast CA   Clinical Impression   This 78 year old female was admitted for the above sx.  All education was completed. Will complete family education with husband prior to d/c.  He has helped her in the past, but it has been a couple of years ago, and she is not sure he has worked with the abductor sling    Follow Up Recommendations  Follow surgeon's recommendation for DC plan and follow-up therapies    Equipment Recommendations  None recommended by OT    Recommendations for Other Services       Precautions / Restrictions Precautions Precautions: Fall;Shoulder Type of Shoulder Precautions: sling on at all times except for adls, exercise (elbow to finger ROM only, no shoulder ROM).   Shoulder Interventions: Shoulder abduction pillow Precaution Booklet Issued: Yes (comment) Restrictions Weight Bearing Restrictions: Yes LUE Weight Bearing: Non weight bearing      Mobility Bed Mobility               General bed mobility comments: min A for OOB with HOB raised  Transfers Overall transfer level: Needs assistance Equipment used: 1 person hand held assist Transfers: Sit to/from Stand Sit to Stand: Min assist         General transfer comment: steadying assistance to stand    Balance                                           ADL either performed or assessed with clinical judgement   ADL Overall ADL's : Needs assistance/impaired Eating/Feeding: Independent   Grooming: Minimal assistance   Upper Body Bathing: Moderate assistance   Lower Body Bathing: Maximal assistance   Upper Body Dressing : Maximal assistance   Lower Body Dressing: Maximal assistance   Toilet Transfer: Minimal assistance;Ambulation;Comfort height toilet    Toileting- Clothing Manipulation and Hygiene: Moderate assistance         General ADL Comments: performed ADL, educated on protocol and sling; demonstrated exercises. Pt unable to move elbow at this time due to block     Vision         Perception     Praxis      Pertinent Vitals/Pain Pain Assessment: Faces Faces Pain Scale: Hurts little more Pain Location: L shoulder, starting to get some feeling Pain Descriptors / Indicators: Sore Pain Intervention(s): Limited activity within patient's tolerance;Monitored during session;Premedicated before session;Repositioned     Hand Dominance Right   Extremity/Trunk Assessment Upper Extremity Assessment Upper Extremity Assessment: LUE deficits/detail LUE Deficits / Details: able to move fingers and wrist.  Pt has redness/blister on upper arm.  RN aware           Communication Communication Communication: No difficulties   Cognition Arousal/Alertness: Awake/alert Behavior During Therapy: WFL for tasks assessed/performed Overall Cognitive Status: Within Functional Limits for tasks assessed                                     General Comments  pt needed cues not to move L shoulder and to relax arm; she tends to recruit traps.  2/4 dyspnea after completing adl  and ambulating to bathroom    Exercises     Shoulder Instructions      Home Living Family/patient expects to be discharged to:: Private residence Living Arrangements: Spouse/significant other;Children Available Help at Discharge: Family Type of Home: House                 Bathroom Toilet: Handicapped height                Prior Functioning/Environment Level of Independence: Independent                 OT Problem List: Decreased strength;Decreased range of motion;Decreased activity tolerance;Pain;Decreased knowledge of precautions      OT Treatment/Interventions: Self-care/ADL training;Therapeutic exercise;DME and/or AE  instruction;Patient/family education;Therapeutic activities    OT Goals(Current goals can be found in the care plan section) Acute Rehab OT Goals Patient Stated Goal: home OT Goal Formulation: With patient Time For Goal Achievement: 01/14/19 Potential to Achieve Goals: Good ADL Goals Additional ADL Goal #1: husband will don sling with supervision and verbalize understanding of protocol, precautions and how to don ice machine sleeve  OT Frequency: Min 2X/week   Barriers to D/C:            Co-evaluation              AM-PAC OT "6 Clicks" Daily Activity     Outcome Measure Help from another person eating meals?: A Little Help from another person taking care of personal grooming?: A Little Help from another person toileting, which includes using toliet, bedpan, or urinal?: A Lot Help from another person bathing (including washing, rinsing, drying)?: A Lot Help from another person to put on and taking off regular upper body clothing?: A Lot Help from another person to put on and taking off regular lower body clothing?: A Lot 6 Click Score: 14   End of Session    Activity Tolerance: Patient tolerated treatment well Patient left: in chair;with call bell/phone within reach  OT Visit Diagnosis: Muscle weakness (generalized) (M62.81);Pain Pain - Right/Left: Left Pain - part of body: Shoulder                Time: 1102-1117 OT Time Calculation (min): 45 min Charges:  OT General Charges $OT Visit: 1 Visit OT Evaluation $OT Eval Low Complexity: 1 Low OT Treatments $Self Care/Home Management : 23-37 mins  Lesle Chris, OTR/L Acute Rehabilitation Services [phone] [facility] pager [phone] office 01/13/2019  [place] 01/13/2019, 11:10 AM

## 2019-01-13 NOTE — Anesthesia Postprocedure Evaluation (Signed)
Anesthesia Post Note  Patient: Brenda Stephens  Procedure(s) Performed: REVISION TOTAL SHOULDER TO REVERSE TOTAL SHOULDER (Left )     Patient location during evaluation: PACU Anesthesia Type: Regional and General Level of consciousness: awake and alert Pain management: pain level controlled Vital Signs Assessment: post-procedure vital signs reviewed and stable Respiratory status: spontaneous breathing, nonlabored ventilation, respiratory function stable and patient connected to nasal cannula oxygen Cardiovascular status: blood pressure returned to baseline and stable Postop Assessment: no apparent nausea or vomiting Anesthetic complications: no    Last Vitals:  Vitals:   01/13/19 0109 01/13/19 0444  BP: 135/79 (!) 165/76  Pulse: 78 77  Resp: 16 16  Temp: 36.4 C 36.7 C  SpO2: 97% 97%    Last Pain:  Vitals:   01/13/19 0912  TempSrc:   PainSc: 0-No pain                 Jewelz Kobus L Larz Mark

## 2019-01-19 ENCOUNTER — Encounter (HOSPITAL_COMMUNITY): Payer: Self-pay | Admitting: Orthopaedic Surgery

## 2019-02-02 ENCOUNTER — Telehealth: Payer: Self-pay | Admitting: Oncology

## 2019-02-02 LAB — AEROBIC/ANAEROBIC CULTURE W GRAM STAIN (SURGICAL/DEEP WOUND)
Culture: NO GROWTH
Culture: NO GROWTH
Culture: NO GROWTH

## 2019-02-02 NOTE — Telephone Encounter (Signed)
Called patient regarding upcoming [facility] appointment, left a voicemail. This will be considered a walk-in visit due to no confirmation to set this up as a virtual visit.

## 2019-02-02 NOTE — Progress Notes (Signed)
[place]  [phone] [phone]     ID: Brenda Stephens DOB: 11-07-1940  MR#: [id number]  KXF#:[id number]  Patient Care Team: Brenda Ruddle, MD as PCP - General (Internal Medicine) Brenda Breeding, MD as PCP - Cardiology (Cardiology) , Brenda Dad, MD as Consulting Physician (Oncology) Brenda Bookbinder, MD as Consulting Physician (General Surgery) Brenda Essex, MD as Consulting Physician (Gastroenterology) OTHER MD:   CHIEF COMPLAINT: Estrogen receptor positive breast cancer  CURRENT TREATMENT: Tamoxifen   INTERVAL HISTORY: Brenda Stephens returns today for follow-up and treatment of her estrogen receptor positive breast cancer.   Brenda Stephens continues on tamoxifen with good tolerance.   Since her last visit, she has not undergone any additional studies. Her last mammogram was at [facility] on 02/08/2018. However, she states she gets them done at [facility].  She also underwent left shoulder surgery on 01/12/2019 under Dr. Griffin Stephens after falling down her stairs. She states she has fallen down the stairs in her house twice; she's not sure how. It was the second time that messed up her shoulder.   REVIEW OF SYSTEMS: Brenda Stephens reports "flash pains" to her breast, as to be expected. Other than going out for lunch occasionally, she stays in. She reports her husband works part time. She states she has not been exercising, especially since her fall. She hopes to get back into this tomorrow. She denies fever, cough, nausea, and balance issues. A detailed review of systems was otherwise stable.     HISTORY OF CURRENT ILLNESS: From the original intake note:  Brenda Stephens had routine bilateral screening mammography at [facility] 01/28/2017. An irregular lesion in the right breast upper outer quadrant was detected and she was recalled for right diagnostic mammography and ultrasonography 02/05/2017. This found the breast density to be category B. In the right breast upper quadrant there was  an irregular mass which by ultrasound measured 0.8 cm. It was located at the 11:00 radiant posteriorly. The right axilla was sonographically benign.  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  The patient's subsequent history is as detailed below.   PAST MEDICAL HISTORY: Past Medical History:  Diagnosis Date  . Allergy    latex  . Anemia   . Anxiety   . Arthritis   . Cancer ([place]) 01/2017   right breast cancer  . Depression   . Dyslipidemia   . Eustachian tube dysfunction   . GERD (gastroesophageal reflux disease)   . Hx of cardiovascular stress test    Myoview 10/13:  apical thinning, EF 72%, no ischemia  . Hx of colonic polyps    Dr Brenda Stephens  . Hypertension   . Hypothyroidism   . Other abnormal glucose     PAST SURGICAL HISTORY: Past Surgical History:  Procedure Laterality Date  . ABDOMINAL HYSTERECTOMY     BSO for uterine fibroids  . ACHILLES TENDON SURGERY Right 10/07/2012   Procedure: RIGHT REPAIR RUPTURE TENDON PRIMARY OPEN/PERCUTANEOUS ;  Surgeon: Brenda Lights, MD;  Location: [place];  Service: Orthopedics;  Laterality: Right;  . APPENDECTOMY    . BREAST LUMPECTOMY WITH RADIOACTIVE SEED AND SENTINEL LYMPH NODE BIOPSY Right 03/16/2017   Procedure: RIGHT BREAST LUMPECTOMY WITH RADIOACTIVE SEED AND RIGHT AXILLARY SENTINEL  NODE BIOPSY ERAS PATHWAY;  Surgeon: Brenda Bookbinder, MD;  Location: [place];  Service: General;  Laterality: Right;  PECTORAL BLOCK  . CATARACT EXTRACTION, BILATERAL    . CHOLECYSTECTOMY    . COLONOSCOPY W/ POLYPECTOMY     Dr Brenda Stephens  . EAR CYST EXCISION Right 10/07/2012   Procedure: EXCISION BONE CYST BENIGN TUMOR Brenda Stephens ;   Surgeon: Brenda Lights, MD;  Location: [place];  Service: Orthopedics;  Laterality: Right;  . EXPLORATORY LAPAROTOMY    . REVISION TOTAL SHOULDER TO REVERSE TOTAL SHOULDER Left 01/12/2019   Procedure: REVISION TOTAL SHOULDER TO REVERSE TOTAL SHOULDER;  Surgeon: Brenda Gash, MD;  Location: [facility];  Service: Orthopedics;  Laterality: Left;  . SINUS SURGERY WITH INSTATRAK    . TOTAL SHOULDER REPLACEMENT     Right shoulder  . 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 TUBAL LIGATION      FAMILY HISTORY Family History  Problem Relation Age of Onset  . Hiatal hernia Mother   . Heart attack Mother 27  . Heart disease Father        CAD @ autopsy  . Cancer Father        renal cancer  . Heart attack Brother 44       fatal  . Diabetes Neg Hx   . Stroke Neg Hx   The patient's father died from kidney cancer at age 52. The patient's mother died from a heart attack at age 61. The patient had one brother, who died from a heart attack. She had no sisters. There is no history of breast or ovarian cancer in the family.   GYNECOLOGIC HISTORY:  No LMP recorded. Patient has had a hysterectomy. Menarche: age 40 First live birth: age 39 Brenda Stephens.  Hysterectomy? Yes BSO? Yes HR: yes, for between 5 and 10 years.   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 Her husband was Brenda Stephens for railroad company. He is retired but currently overseas the [place] property as a Freight forwarder. Their daughter Brenda Stephens is an attorney but currently works as a Education officer, museum in [place]. The patient has no grandchildren. She is not a church attender    ADVANCED DIRECTIVES: Her husband is her healthcare power of attorney per [employer] MAINTENANCE: Social History   Tobacco Use  . Smoking status: Former Smoker    Packs/day: 0.50    Years: 6.00    Pack years: 3.00    Types: Cigarettes    Quit date: 06/30/1962    Years since  quitting: 56.6  . Smokeless tobacco: Never Used  . Tobacco comment: Quit age 43  Substance Use Topics  . Alcohol use: Yes    Alcohol/week: 3.0 standard drinks    Types: 3 Glasses of wine per week    Comment: wine  : < 3 glasses weekly  . Drug use: No     Colonoscopy:May got  PAP: Status post hysterectomy  Bone density: Due/[facility]   Allergies  Allergen Reactions  . Latex Rash  . Aspirin Other (See Comments)    GI bleeding  . Oxycodone-Acetaminophen Nausea Only  . Sulfa Antibiotics Nausea Only  . Sulfamethoxazole-Trimethoprim Nausea Only    Current Outpatient Medications  Medication Sig Dispense Refill  . atorvastatin (LIPITOR) 20 MG tablet TAKE 1 TABLET BY MOUTH AT BEDTIME (Patient taking differently: Take 20 mg by mouth daily. ) 90 tablet 1  . celecoxib (  CELEBREX) 200 MG capsule Take 1 capsule (200 mg total) by mouth 2 (two) times daily. 60 capsule 1  . diclofenac sodium (VOLTAREN) 1 % GEL Apply 1 application topically 2 (two) times a day.    . dicyclomine (BENTYL) 10 MG capsule Take 10 mg by mouth daily.     Marland Kitchen doxycycline (VIBRAMYCIN) 50 MG capsule Take 2 capsules (100 mg total) by mouth 2 (two) times daily. 120 capsule 0  . esomeprazole (NEXIUM 24HR) 20 MG capsule Take 40 mg by mouth daily.     . metoprolol succinate (TOPROL-XL) 50 MG 24 hr tablet Take 1 tablet (50 mg total) by mouth daily. 90 tablet 3  . Multiple Vitamins-Minerals (CENTRUM SILVER 50+WOMEN) TABS Take 1 tablet by mouth daily.    Marland Kitchen oxybutynin (DITROPAN) 5 MG tablet Take 5 mg by mouth 2 (two) times a day.    Vladimir Faster Glycol-Propyl Glycol (SYSTANE) 0.4-0.3 % GEL ophthalmic gel Place 1 application into both eyes 3 (three) times daily as needed (for dry eyes).    . pseudoephedrine (SUDAFED) 30 MG/5ML syrup Take 15 mg by mouth every 6 (six) hours as needed for congestion.    . Saline (AYR SALINE NASAL DROPS) 0.65 % (SOLN) SOLN Place 1 spray into the nose 3 (three) times daily as needed (for congestion).     Marland Kitchen  spironolactone-hydrochlorothiazide (ALDACTAZIDE) 25-25 MG per tablet Take 1 tablet by mouth daily. 90 tablet 3  . SYNTHROID 50 MCG tablet PT NEEDS COMPLETE PHYSICAL.  TAKE 1 TABLET (50MCG TOTAL) BY MOUTH DAILY. (Patient taking differently: Take 50 mcg by mouth daily before breakfast. ) 30 tablet 0  . tamoxifen (NOLVADEX) 20 MG tablet TAKE 1 TABLET BY MOUTH EVERY DAY (Patient taking differently: Take 20 mg by mouth daily. ) 90 tablet 0  . venlafaxine XR (EFFEXOR-XR) 150 MG 24 hr capsule TAKE 1 CAPSULE (150 MG TOTAL) BY MOUTH DAILY. (Patient taking differently: Take 150 mg by mouth daily with breakfast. ) 90 capsule 2  . verapamil (CALAN-SR) 240 MG CR tablet TAKE 1 TABLET (240 MG TOTAL) BY MOUTH AT BEDTIME. (Patient taking differently: Take 240 mg by mouth daily. ) 30 tablet 0  . zolpidem (AMBIEN) 5 MG tablet Take 5 mg by mouth at bedtime.      No current facility-administered medications for this visit.    ppears well  OBJECTIVE: Older white woman in no acute distress  Vitals:   02/03/19 1055  BP: (!) 156/74  Pulse: 87  Resp: 18  Temp: 98.7 F (37.1 C)  SpO2: 97%     Body mass index is 32.79 kg/m.   Wt Readings from Last 3 Encounters:  02/03/19 185 lb 1.6 oz (84 kg)  01/12/19 191 lb 4 oz (86.7 kg)  01/04/19 191 lb 4 oz (86.8 kg)      ECOG FS:1 - Symptomatic but completely ambulatory  Sclerae unicteric, EOMs intact Wearing a mask No cervical or supraclavicular adenopathy Lungs no rales or rhonchi Heart regular rate and rhythm Abd soft, nontender, positive bowel sounds MSK no focal spinal tenderness, left upper extremity immobilized Neuro: nonfocal, well oriented, appropriate affect Breasts: Deferred  LAB RESULTS:  CMP     Component Value Date/Time   NA 133 (L) 01/04/2019 1036   NA 132 (L) 03/13/2017 1252   K 4.2 01/04/2019 1036   K 4.3 03/13/2017 1252   CL 101 01/04/2019 1036   CO2 23 01/04/2019 1036   CO2 26 03/13/2017 1252   GLUCOSE 111 (H) 01/04/2019 1036  GLUCOSE 107 03/13/2017 1252   BUN 25 (H) 01/04/2019 1036   BUN 14.8 03/13/2017 1252   CREATININE 1.05 (H) 01/04/2019 1036   CREATININE 0.8 03/13/2017 1252   CALCIUM 9.0 01/04/2019 1036   CALCIUM 10.0 03/13/2017 1252   PROT 6.5 02/04/2018 1014   PROT 7.2 03/13/2017 1252   ALBUMIN 3.6 02/04/2018 1014   ALBUMIN 4.0 03/13/2017 1252   AST 18 02/04/2018 1014   AST 18 03/13/2017 1252   ALT 20 02/04/2018 1014   ALT 18 03/13/2017 1252   ALKPHOS 45 02/04/2018 1014   ALKPHOS 71 03/13/2017 1252   BILITOT 0.6 02/04/2018 1014   BILITOT 0.54 03/13/2017 1252   GFRNONAA 51 (L) 01/04/2019 1036   GFRAA 59 (L) 01/04/2019 1036    No results found for: TOTALPROTELP, ALBUMINELP, A1GS, A2GS, BETS, BETA2SER, GAMS, MSPIKE, SPEI  No results found for: Nils Pyle, [employer]  Lab Results  Component Value Date   WBC 6.0 02/03/2019   NEUTROABS 4.1 02/03/2019   HGB 11.3 (L) 02/03/2019   HCT 34.3 (L) 02/03/2019   MCV 91.2 02/03/2019   PLT 270 02/03/2019      Chemistry      Component Value Date/Time   NA 133 (L) 01/04/2019 1036   NA 132 (L) 03/13/2017 1252   K 4.2 01/04/2019 1036   K 4.3 03/13/2017 1252   CL 101 01/04/2019 1036   CO2 23 01/04/2019 1036   CO2 26 03/13/2017 1252   BUN 25 (H) 01/04/2019 1036   BUN 14.8 03/13/2017 1252   CREATININE 1.05 (H) 01/04/2019 1036   CREATININE 0.8 03/13/2017 1252      Component Value Date/Time   CALCIUM 9.0 01/04/2019 1036   CALCIUM 10.0 03/13/2017 1252   ALKPHOS 45 02/04/2018 1014   ALKPHOS 71 03/13/2017 1252   AST 18 02/04/2018 1014   AST 18 03/13/2017 1252   ALT 20 02/04/2018 1014   ALT 18 03/13/2017 1252   BILITOT 0.6 02/04/2018 1014   BILITOT 0.54 03/13/2017 1252       No results found for: LABCA2  No components found for: [id number]  No results for input(s): INR in the last 168 hours.  No results found for: LABCA2  No results found for: DQQ229  No results found for: NLG921  No results found for: JHE174  No  results found for: CA2729  No components found for: HGQUANT  No results found for: CEA1 / No results found for: CEA1   No results found for: AFPTUMOR  No results found for: CHROMOGRNA  No results found for: PSA1  Appointment on 02/03/2019  Component Date Value Ref Range Status  . WBC 02/03/2019 6.0  4.0 - 10.5 K/uL Final  . RBC 02/03/2019 3.76* 3.87 - 5.11 MIL/uL Final  . Hemoglobin 02/03/2019 11.3* 12.0 - 15.0 g/dL Final  . HCT 02/03/2019 34.3* 36.0 - 46.0 % Final  . MCV 02/03/2019 91.2  80.0 - 100.0 fL Final  . MCH 02/03/2019 30.1  26.0 - 34.0 pg Final  . MCHC 02/03/2019 32.9  30.0 - 36.0 g/dL Final  . RDW 02/03/2019 12.3  11.5 - 15.5 % Final  . Platelets 02/03/2019 270  150 - 400 K/uL Final  . nRBC 02/03/2019 0.0  0.0 - 0.2 % Final  . Neutrophils Relative % 02/03/2019 68  % Final  . Neutro Abs 02/03/2019 4.1  1.7 - 7.7 K/uL Final  . Lymphocytes Relative 02/03/2019 16  % Final  . Lymphs Abs 02/03/2019 0.9  0.7 - 4.0 K/uL Final  .  Monocytes Relative 02/03/2019 11  % Final  . Monocytes Absolute 02/03/2019 0.7  0.1 - 1.0 K/uL Final  . Eosinophils Relative 02/03/2019 4  % Final  . Eosinophils Absolute 02/03/2019 0.2  0.0 - 0.5 K/uL Final  . Basophils Relative 02/03/2019 1  % Final  . Basophils Absolute 02/03/2019 0.1  0.0 - 0.1 K/uL Final  . Immature Granulocytes 02/03/2019 0  % Final  . Abs Immature Granulocytes 02/03/2019 0.02  0.00 - 0.07 K/uL Final   Performed at [facility], [address] [place], [place] [address]    (this displays the last labs from the last 3 days)  No results found for: TOTALPROTELP, ALBUMINELP, A1GS, A2GS, BETS, BETA2SER, GAMS, MSPIKE, SPEI (this displays SPEP labs)  No results found for: KPAFRELGTCHN, LAMBDASER, KAPLAMBRATIO (kappa/lambda light chains)  No results found for: HGBA, HGBA2QUANT, HGBFQUANT, HGBSQUAN (Hemoglobinopathy evaluation)   No results found for: LDH  Lab Results  Component Value Date   IRON  59 12/18/2010   IRONPCTSAT 12.5 (L) 12/18/2010   (Iron and TIBC)  No results found for: FERRITIN  Urinalysis    Component Value Date/Time   COLORURINE YELLOW 06/04/2010 0845   APPEARANCEUR CLOUDY (A) 06/04/2010 0845   LABSPEC 1.011 06/04/2010 0845   PHURINE 7.5 06/04/2010 0845   GLUCOSEU NEGATIVE 06/04/2010 0845   HGBUR NEGATIVE 06/04/2010 0845   BILIRUBINUR NEGATIVE 06/04/2010 0845   KETONESUR NEGATIVE 06/04/2010 0845   PROTEINUR NEGATIVE 06/04/2010 0845   UROBILINOGEN 0.2 06/04/2010 0845   NITRITE NEGATIVE 06/04/2010 0845   LEUKOCYTESUR LARGE (A) 06/04/2010 0845     STUDIES: Ct Shoulder Left Wo Contrast  Result Date: 01/10/2019 CLINICAL DATA:  Left shoulder pain and limited range of motion for 6 weeks. History of falls. History of breast cancer. EXAM: CT OF THE UPPER LEFT EXTREMITY WITHOUT CONTRAST TECHNIQUE: Multidetector CT imaging of the upper left extremity was performed according to the standard protocol. COMPARISON:  CT 03/31/2018.  X-ray 02/15/2018 FINDINGS: Patient is status post left total shoulder arthroplasty. Beam hardening artifact from hardware partially obscures the adjacent osseous and soft tissue structures. The humeral component is subluxed superiorly relative to the radiolucent glenoid component. Superior aspect of the humeral head articulates with the undersurface of the acromion where there is associated remodeling of the acromion. No periprosthetic fracture. No osteolysis. No large periprosthetic fluid collection identified. Fatty infiltration of the subscapularis muscle. There is suggestion of mild fatty infiltration also involving the supraspinatus muscle, although adjacent artifact degrades evaluation of this area. No left axillary lymphadenopathy. Visualized portion of the left lung is clear. Advanced degenerative changes within the visualized lower cervical spine as well as the thoracic spine. Scattered atherosclerotic calcifications of the thoracic aorta.  IMPRESSION: 1. Status post left total shoulder arthroplasty without periprosthetic fracture or osteolysis. 2. Chronic superior migration of the humeral component relative to the glenoid with remodeling of the undersurface of the acromion. Electronically Signed   By: Davina Poke   On: 01/10/2019 16:34   Dg Shoulder Left  Result Date: 01/12/2019 CLINICAL DATA:  Portable imaging for left shoulder arthroplasty. EXAM: LEFT SHOULDER - 2+ VIEW; DG C-ARM 1-60 MIN-NO REPORT COMPARISON:  Left shoulder CT, 01/10/2019 FINDINGS: The previous left shoulder arthroplasty has been replaced. There is now a reverse prosthesis. The prosthetic components appear well seated and aligned. IMPRESSION: Imaging provided for left shoulder arthroplasty. Reverse shoulder prosthesis appears well positioned. Electronically Signed   By: Lajean Manes M.D.   On: 01/12/2019 13:49   Dg  Shoulder Left Port  Result Date: 01/12/2019 CLINICAL DATA:  S/p reverse left shoulder prosthesis. EXAM: LEFT SHOULDER - 1 VIEW COMPARISON:  Current operative images of the left shoulder. FINDINGS: Two submitted images show placement of a reverse left shoulder prosthesis. The glenoid and proximal humeral components appear well seated and aligned. No acute fracture or evidence of an operative complication. IMPRESSION: Well-positioned left shoulder prosthesis. Electronically Signed   By: Lajean Manes M.D.   On: 01/12/2019 15:10   Dg Duanne Limerick W Double Cm (hd Ba)  Result Date: 01/06/2019 CLINICAL DATA:  Globus sensation in the chest with food. Reported reflux esophagitis. EXAM: UPPER GI SERIES WITH KUB TECHNIQUE: After obtaining a scout radiograph a routine upper GI series was performed using thin and high density barium. FLUOROSCOPY TIME:  Fluoroscopy Time:  3 minutes 48 seconds Radiation Exposure Index (if provided by the fluoroscopic device): 341 mGy Number of Acquired Spot Images: 15 COMPARISON:  05/28/2015 CT abdomen/pelvis. FINDINGS: Examination limited by  patient mobility restrictions. Specifically, patient unable to turn from supine to prone. Scout radiograph demonstrates no dilated small bowel loops. No evidence of pneumatosis or pneumoperitoneum. No radiopaque nephrolithiasis. Clear lung bases. There is a focus of eccentric thin linear narrowing in the lower cervical esophagus, at which location the barium tablet became lodged for approximately 30 seconds. Mild esophageal dysmotility, characterized by intermittent mild weakening of primary peristalsis in the lower thoracic esophagus. Small sliding hiatal hernia. Mild gastroesophageal reflux elicited to the level of the lower thoracic esophagus with water siphon test. Esophageal mucosa appears normal, with no evidence of reflux esophagitis. Thoracic esophagus is normal in distensibility, with no evidence of thoracic esophageal stricture, mass or ulcer. Normal gastric emptying and peristalsis. Mild smooth fold thickening throughout proximal stomach. No gastric filling defects, ulcers or significant erosions. There are multiple small to moderate duodenal diverticula in the descending duodenum. Otherwise normal duodenum, with no duodenal fold thickening, filling defects, strictures or ulcers. Normal duodenal jejunal junction to the left of the spine. Visualized proximal jejunal loops are normal caliber and without fold thickening. IMPRESSION: 1. 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 2. Small sliding hiatal hernia. Mild gastroesophageal reflux elicited. 3. Mild esophageal dysmotility, characteristic of chronic reflux related dysmotility. 4. No evidence of reflux esophagitis. No thoracic esophageal stricture. No esophageal mass or ulcer. 5. Mild smooth fold thickening throughout the proximal stomach, nonspecific, most commonly inflammatory. Upper endoscopic correlation may be considered.  No gastric filling defects or ulcers. 6. Small to moderate descending duodenal diverticula. Otherwise normal duodenum. Electronically Signed   By: Ilona Sorrel M.D.   On: 01/06/2019 09:04   Dg C-arm 1-60 Min-no Report  Result Date: 01/12/2019 Fluoroscopy was utilized by the requesting physician.  No radiographic interpretation.    ELIGIBLE FOR AVAILABLE RESEARCH PROTOCOL: no  ASSESSMENT: 78 y.o. [place] woman status post left breast upper outer quadrant biopsy 02/09/2017 for a clinical T1b N0, stage IA invasive ductal carcinoma, grade 1, estrogen and progesterone receptor positive, HER-2 nonamplified, with an MIB-1 of 2%  (1) breast conserving surgery with sentinel lymph node sampling  03/16/2017 showed a pT1b pN0, stage IA invasive ductal carcinoma, grade 1, with negative margins (2) adjuvant radiation not anticipated  (2) adjuvant radiation not felt to be needed if patient takes antiestrogen for 5 years  (3) Tamoxifen started 04/10/2017  (a) status post remote hysterectomy with bilateral salpingo-oophorectomy  (b)  bone density at [facility] 03/30/2017 shows a T score of -1.0  PLAN: Aniyiah is now just about 2 years out from definitive surgery for her breast cancer with no evidence of disease recurrence.  This is very favorable.  She is tolerating tamoxifen well.  The plan is to continue that a minimum of 5 years.  I am not sure and she cannot tell me why she fell twice in the same area.  I offered her participation in our vestibular rehabilitation physical therapy program and she will let me know if she is interested.  Otherwise we are postponing her mammogram until early September so she can be out of her cast.  This means I will see her again mid September of next year  She knows to call for any other issues that may develop before that visit.  , Brenda Dad, MD  02/03/19 11:19 AM Medical Oncology and Hematology [facility] [address] [place], [place]  [address] Tel. [phone]    Fax. [phone]    I, Wilburn Mylar, am acting as scribe for Dr. Virgie Stephens. .  I, Lurline Del MD, have reviewed the above documentation for accuracy and completeness, and I agree with the above.

## 2019-02-03 ENCOUNTER — Inpatient Hospital Stay: Payer: MEDICARE | Attending: Oncology

## 2019-02-03 ENCOUNTER — Other Ambulatory Visit: Payer: Self-pay

## 2019-02-03 ENCOUNTER — Inpatient Hospital Stay (HOSPITAL_BASED_OUTPATIENT_CLINIC_OR_DEPARTMENT_OTHER): Payer: MEDICARE | Admitting: Oncology

## 2019-02-03 VITALS — BP 156/74 | HR 87 | Temp 98.7°F | Resp 18 | Ht 63.0 in | Wt 185.1 lb

## 2019-02-03 DIAGNOSIS — C50411 Malignant neoplasm of upper-outer quadrant of right female breast: Secondary | ICD-10-CM | POA: Diagnosis present

## 2019-02-03 DIAGNOSIS — Z79810 Long term (current) use of selective estrogen receptor modulators (SERMs): Secondary | ICD-10-CM | POA: Diagnosis not present

## 2019-02-03 DIAGNOSIS — Z170 Estrogen receptor positive status [ER+]: Secondary | ICD-10-CM | POA: Insufficient documentation

## 2019-02-03 LAB — CBC WITH DIFFERENTIAL/PLATELET
Abs Immature Granulocytes: 0.02 10*3/uL (ref 0.00–0.07)
Basophils Absolute: 0.1 10*3/uL (ref 0.0–0.1)
Basophils Relative: 1 %
Eosinophils Absolute: 0.2 10*3/uL (ref 0.0–0.5)
Eosinophils Relative: 4 %
HCT: 34.3 % — ABNORMAL LOW (ref 36.0–46.0)
Hemoglobin: 11.3 g/dL — ABNORMAL LOW (ref 12.0–15.0)
Immature Granulocytes: 0 %
Lymphocytes Relative: 16 %
Lymphs Abs: 0.9 10*3/uL (ref 0.7–4.0)
MCH: 30.1 pg (ref 26.0–34.0)
MCHC: 32.9 g/dL (ref 30.0–36.0)
MCV: 91.2 fL (ref 80.0–100.0)
Monocytes Absolute: 0.7 10*3/uL (ref 0.1–1.0)
Monocytes Relative: 11 %
Neutro Abs: 4.1 10*3/uL (ref 1.7–7.7)
Neutrophils Relative %: 68 %
Platelets: 270 10*3/uL (ref 150–400)
RBC: 3.76 MIL/uL — ABNORMAL LOW (ref 3.87–5.11)
RDW: 12.3 % (ref 11.5–15.5)
WBC: 6 10*3/uL (ref 4.0–10.5)
nRBC: 0 % (ref 0.0–0.2)

## 2019-02-03 LAB — COMPREHENSIVE METABOLIC PANEL
ALT: 27 U/L (ref 0–44)
AST: 21 U/L (ref 15–41)
Albumin: 3.4 g/dL — ABNORMAL LOW (ref 3.5–5.0)
Alkaline Phosphatase: 66 U/L (ref 38–126)
Anion gap: 11 (ref 5–15)
BUN: 24 mg/dL — ABNORMAL HIGH (ref 8–23)
CO2: 20 mmol/L — ABNORMAL LOW (ref 22–32)
Calcium: 9.3 mg/dL (ref 8.9–10.3)
Chloride: 101 mmol/L (ref 98–111)
Creatinine, Ser: 1.05 mg/dL — ABNORMAL HIGH (ref 0.44–1.00)
GFR calc Af Amer: 59 mL/min — ABNORMAL LOW (ref >60)
GFR calc non Af Amer: 51 mL/min — ABNORMAL LOW (ref >60)
Glucose, Bld: 114 mg/dL — ABNORMAL HIGH (ref 70–99)
Potassium: 4.1 mmol/L (ref 3.5–5.1)
Sodium: 132 mmol/L — ABNORMAL LOW (ref 135–145)
Total Bilirubin: 0.5 mg/dL (ref 0.3–1.2)
Total Protein: 6.6 g/dL (ref 6.5–8.1)

## 2019-02-04 ENCOUNTER — Telehealth: Payer: Self-pay | Admitting: Oncology

## 2019-02-04 NOTE — Telephone Encounter (Signed)
I talk with patient regarding schedule  

## 2019-02-21 ENCOUNTER — Other Ambulatory Visit: Payer: Self-pay | Admitting: Oncology

## 2019-03-23 ENCOUNTER — Telehealth: Payer: Self-pay | Admitting: *Deleted

## 2019-03-23 NOTE — Telephone Encounter (Signed)
Pt has developed rash under breast  associated with itching,burning. MYCOSTATIN/NYSTOP powder was prescribed to treat it is not working anymore per patient. Could we treat her with a different medication. Will send to her Provider for recommendations.

## 2019-03-25 NOTE — Telephone Encounter (Signed)
Left message for pt to return my call.

## 2019-03-25 NOTE — Telephone Encounter (Signed)
Can try Monistat cream OTC and Hydrocortisone 1% cream OTC.

## 2019-04-04 ENCOUNTER — Other Ambulatory Visit: Payer: Self-pay | Admitting: Women's Health

## 2019-04-04 ENCOUNTER — Other Ambulatory Visit: Payer: Self-pay

## 2019-04-04 ENCOUNTER — Encounter: Payer: Self-pay | Admitting: Women's Health

## 2019-04-04 ENCOUNTER — Ambulatory Visit (INDEPENDENT_AMBULATORY_CARE_PROVIDER_SITE_OTHER): Payer: MEDICARE | Admitting: Women's Health

## 2019-04-04 VITALS — BP 124/80

## 2019-04-04 DIAGNOSIS — L740 Miliaria rubra: Secondary | ICD-10-CM | POA: Diagnosis not present

## 2019-04-04 MED ORDER — NYSTATIN-TRIAMCINOLONE 100000-0.1 UNIT/GM-% EX OINT: 1.0000 "application " | TOPICAL_OINTMENT | Freq: Two times a day (BID) | CUTANEOUS | 0 refills | Status: DC

## 2019-04-04 MED ORDER — NYSTATIN 100000 UNIT/GM EX POWD: Freq: Four times a day (QID) | CUTANEOUS | 0 refills | Status: DC

## 2019-04-04 NOTE — Progress Notes (Signed)
78 year old G1P1 presents with a rash under R breast that has worsened within the last 4 days. She was last seen 5 months ago for rash under both breast and was prescribed Nystatin Powder. Rash has been coming and going. Left breast currently has mild erythema, and has cleared up significantly per patient. Reports itching and mild irritation to the touch. Denies pain. Blood sugar 112 at primary care office.  Medical history includes R breast cancer, arthritis, depression, GERD, dyslipidemia, HTN  Exam: Appears well, well groomed. Breast pendulous. Under R breast: severe erythematous flat papular rash that extends downward toward mid-abdomen, moist, no drainage, no thickening              Under L beast: mild erythematous stationary under breast, dry, no drainage, no thickening  Dx: Heat Rash  Plan: Mycolog ointment 60g tube Apply topically 2 times daily to R breast for rash once area starts to heal use Nystatin powder 15g Apply topically 4 times daily to both breast for rash as needed.  Keep clean and dry. [place]. Avoid skin to skin. After showers dry under breast with cool blow dryer. Watch simple sugars and diet.

## 2019-04-04 NOTE — Telephone Encounter (Signed)
Patient called back c/o rash is worse, I told her best to schedule visit. Rash has now spend down to mid stomach area.

## 2019-04-04 NOTE — Patient Instructions (Signed)
Heat Rash, Adult  Heat rash is an itchy rash of little red bumps that often occurs during hot, humid weather. Heat rash is also called prickly heat or miliaria. Heat rash usually affects:  Armpits.  Elbows.  Groin.  Neck.  The area underneath the breasts.  Shoulders.  Chest. What are the causes? This condition is caused by blocked sweat ducts. When sweat is trapped under the skin, it spreads into surrounding tissues and causes a rash of red bumps. What increases the risk? This condition is more likely to develop in people who:  Are overdressed in hot, humid weather.  Wear clothing that rubs against the skin.  Are active in hot, humid weather.  Sweat a lot.  Are not used to hot, humid weather. What are the signs or symptoms? Symptoms of this condition include:  Small red bumps that are itchy or prickly.  Very little sweating or no sweating in the affected area. How is this diagnosed? This condition is diagnosed based on your symptoms and medical history, as well as a physical exam. How is this treated? Moving to a cool, dry place is the best treatment for heat rash. Treatment may also include medicines, such as:  Corticosteroid creams for skin irritation.  Antibiotic medicines, if the rash becomes infected. Follow these instructions at home: Skin care  Keep the affected area dry.  Do not apply ointments or creams that contain mineral oil or petroleum ingredients to your skin. These can make the condition worse.  Apply cool compresses to the affected areas.  Do not scratch your skin.  Do not take hot showers or baths. General instructions  Take over-the-counter and prescription medicines only as told by your health care provider.  If you were prescribed an antibiotic, take it as told by your health care provider. Do not stop taking it even if your condition improves.  Stay in a cool room as much as possible. Use an air conditioner or fan, if possible.   Do not wear tight clothes. Wear comfortable, loose-fitting clothing.  Keep all follow-up visits as told by your health care provider. This is important. Contact a health care provider if:  You have a fever.  Your rash does not go away after 3-4 days.  Your rash gets worse or it is very itchy.  Your rash has pus or fluid coming from it. Get help right away if:  You are dizzy or nauseated.  You feel confused.  You have trouble breathing.  You have chest pain.  You have muscle cramps or contractions.  You faint. Summary  Heat rash is an itchy rash of little red bumps that often occurs during hot, humid weather.  Symptoms of heat rash include small red bumps that are itchy or prickly and very little or no sweating in the affected area.  This condition is diagnosed based on your symptoms and medical history, as well as a physical exam.  Moving to a cool, dry place is the best treatment for heat rash.  Do not wear tight clothes. Wear comfortable, loose-fitting clothing. This information is not intended to replace advice given to you by your health care provider. Make sure you discuss any questions you have with your health care provider. Document Released: 06/04/2009 Document Revised: 05/29/2017 Document Reviewed: 08/27/2016 Elsevier Patient Education  2020 [employer].

## 2019-04-05 NOTE — Telephone Encounter (Signed)
I called and left message for pharmacy to send me PA information

## 2019-04-25 ENCOUNTER — Ambulatory Visit (INDEPENDENT_AMBULATORY_CARE_PROVIDER_SITE_OTHER): Payer: MEDICARE

## 2019-04-25 ENCOUNTER — Other Ambulatory Visit: Payer: Self-pay | Admitting: Podiatry

## 2019-04-25 ENCOUNTER — Ambulatory Visit (INDEPENDENT_AMBULATORY_CARE_PROVIDER_SITE_OTHER): Payer: MEDICARE | Admitting: Podiatry

## 2019-04-25 ENCOUNTER — Encounter: Payer: Self-pay | Admitting: Podiatry

## 2019-04-25 ENCOUNTER — Other Ambulatory Visit: Payer: Self-pay

## 2019-04-25 DIAGNOSIS — M722 Plantar fascial fibromatosis: Secondary | ICD-10-CM

## 2019-04-25 DIAGNOSIS — M79672 Pain in left foot: Secondary | ICD-10-CM

## 2019-04-25 NOTE — Patient Instructions (Signed)

## 2019-04-27 NOTE — Progress Notes (Signed)
Subjective:   Patient ID: Brenda Stephens, female   DOB: 78 y.o.   MRN: [id number]   HPI Patient states she is developed severe pain in the bottom of the left heel is very sore and making it hard for her to walk and has been going on for a little while and worse over the last few weeks   Review of Systems  All other systems reviewed and are negative.       Objective:  Physical Exam Vitals signs and nursing note reviewed.  Constitutional:      Appearance: She is well-developed.  Pulmonary:     Effort: Pulmonary effort is normal.  Musculoskeletal: Normal range of motion.  Skin:    General: Skin is warm.  Neurological:     Mental Status: She is alert.     Neurovascular status intact muscle strength found to be adequate range of motion within normal limits.  Patient is found to have exquisite discomfort plantar aspect right heel at the insertional point tendon into the calcaneus with inflammation fluid around the medial band      Assessment:  Acute plantar fasciitis right with inflammation fluid     Plan:  H&P x-ray reviewed today I injected the plantar fascia after sterile prep 3 mg Kenalog 5 mg Xylocaine advised on physical therapy anti-inflammatories and reappoint to recheck  X-rays indicate spur no indication stress fracture arthritis

## 2019-04-28 ENCOUNTER — Ambulatory Visit: Payer: MEDICARE | Admitting: Podiatry

## 2019-05-11 ENCOUNTER — Ambulatory Visit (INDEPENDENT_AMBULATORY_CARE_PROVIDER_SITE_OTHER): Payer: MEDICARE | Admitting: Podiatry

## 2019-05-11 ENCOUNTER — Other Ambulatory Visit: Payer: Self-pay

## 2019-05-11 ENCOUNTER — Encounter: Payer: Self-pay | Admitting: Podiatry

## 2019-05-11 DIAGNOSIS — M76822 Posterior tibial tendinitis, left leg: Secondary | ICD-10-CM

## 2019-05-11 DIAGNOSIS — M722 Plantar fascial fibromatosis: Secondary | ICD-10-CM

## 2019-05-11 NOTE — Progress Notes (Signed)
Subjective:   Patient ID: Brenda Stephens, female   DOB: 78 y.o.   MRN: [id number]   HPI Patient states the heel seems to be improved but she is getting pain in her ankle and her foot feels unstable and states that she is having trouble walking with her left foot   ROS      Objective:  Physical Exam  Neurovascular status intact with inflammation of the posterior tibial tendon left with mild plantar fascial symptomatology still present     Assessment:  Acute posterior tibial tendinitis left with plantar fascial symptomatology     Plan:  Advised this patient on immobilization and dispensed air fracture walker to completely let it rest and did careful sheath injection posterior tibial tendon 3 mg Dexasone Kenalog 5 mg Xylocaine and advised on reduced activity

## 2019-06-01 ENCOUNTER — Encounter: Payer: Self-pay | Admitting: Podiatry

## 2019-06-01 ENCOUNTER — Ambulatory Visit (INDEPENDENT_AMBULATORY_CARE_PROVIDER_SITE_OTHER): Payer: MEDICARE | Admitting: Podiatry

## 2019-06-01 ENCOUNTER — Ambulatory Visit (INDEPENDENT_AMBULATORY_CARE_PROVIDER_SITE_OTHER): Payer: MEDICARE

## 2019-06-01 ENCOUNTER — Other Ambulatory Visit: Payer: Self-pay

## 2019-06-01 DIAGNOSIS — S9002XA Contusion of left ankle, initial encounter: Secondary | ICD-10-CM

## 2019-06-01 DIAGNOSIS — R600 Localized edema: Secondary | ICD-10-CM | POA: Diagnosis not present

## 2019-06-01 DIAGNOSIS — T148XXA Other injury of unspecified body region, initial encounter: Secondary | ICD-10-CM

## 2019-06-01 NOTE — Progress Notes (Signed)
Subjective:   Patient ID: Brenda Stephens, female   DOB: 78 y.o.   MRN: [id number]   HPI Patient presents stating that she fell a week ago on her left ankle and its been very swollen and painful and hard for her to walk and she has been wearing her boot.  She did see Dr. And was given diagnosis of possible Achilles tendon injury   ROS      Objective:  Physical Exam  Neurovascular status intact negative Bevelyn Buckles' sign noted with quite a bit of edema in the medial side of the left ankle and into the Achilles tendon area left with discoloration of the tissue noted     Assessment:  Edema secondary to trauma of the left ankle with possibility for tear of the Achilles tendon with swelling noted     Plan:  H&P tested the Achilles and I do think it is intact but I cannot make a complete determination due to the swelling with patient having quite a bit of pain in the area.  Today I went ahead and I did apply Unna boot Ace wrap to try to compress the area and reduce the swelling and reviewed x-ray and continued boot usage reappoint 2 weeks recommend a better determination  X-rays indicate that there is no signs of a bone fracture issue it appears to be soft tissue

## 2019-06-15 ENCOUNTER — Other Ambulatory Visit: Payer: Self-pay

## 2019-06-15 ENCOUNTER — Ambulatory Visit (INDEPENDENT_AMBULATORY_CARE_PROVIDER_SITE_OTHER): Payer: MEDICARE | Admitting: Podiatry

## 2019-06-15 ENCOUNTER — Encounter: Payer: Self-pay | Admitting: Podiatry

## 2019-06-15 DIAGNOSIS — R600 Localized edema: Secondary | ICD-10-CM | POA: Diagnosis not present

## 2019-06-15 DIAGNOSIS — M79672 Pain in left foot: Secondary | ICD-10-CM | POA: Diagnosis not present

## 2019-06-15 DIAGNOSIS — T148XXA Other injury of unspecified body region, initial encounter: Secondary | ICD-10-CM

## 2019-06-22 ENCOUNTER — Ambulatory Visit: Payer: MEDICARE | Attending: Internal Medicine

## 2019-06-22 DIAGNOSIS — Z20822 Contact with and (suspected) exposure to covid-19: Secondary | ICD-10-CM

## 2019-06-24 LAB — NOVEL CORONAVIRUS, NAA: SARS-CoV-2, NAA: NOT DETECTED

## 2019-06-29 NOTE — Progress Notes (Signed)
Subjective:   Patient ID: Brenda Stephens, female   DOB: 78 y.o.   MRN: [id number]   HPI Patient states she is doing a lot better with still occasional discomfort but overall very pleased   ROS      Objective:  Physical Exam  Neurovascular status intact with diminishment of discomfort left ankle and heel with inflammation still noted only upon deep palpation     Assessment:  Improving from having had treatment of left heel ankle pain     Plan:  H&P reviewed condition and recommended anti-inflammatories continued physical therapy supportive shoes and patient will be seen back to recheck as needed basis and all questions were answered and encouragement given the patient

## 2019-07-21 ENCOUNTER — Other Ambulatory Visit: Payer: Self-pay

## 2019-07-21 ENCOUNTER — Ambulatory Visit (INDEPENDENT_AMBULATORY_CARE_PROVIDER_SITE_OTHER): Payer: MEDICARE | Admitting: Podiatry

## 2019-07-21 ENCOUNTER — Encounter: Payer: Self-pay | Admitting: Podiatry

## 2019-07-21 VITALS — Temp 96.5°F

## 2019-07-21 DIAGNOSIS — R600 Localized edema: Secondary | ICD-10-CM | POA: Diagnosis not present

## 2019-07-21 DIAGNOSIS — M722 Plantar fascial fibromatosis: Secondary | ICD-10-CM | POA: Diagnosis not present

## 2019-07-21 DIAGNOSIS — M76822 Posterior tibial tendinitis, left leg: Secondary | ICD-10-CM

## 2019-07-25 NOTE — H&P (View-Only) (Signed)
Cardiology Office Note   Date:  07/26/2019   ID:  Brenda, Stephens [birth year], MRN [id number]  PCP:  Arlyss Repress, MD  Cardiologist:   [name], MD   Chief Complaint  Patient presents with  . Chest Pain      History of Present Illness: Brenda Stephens is a 79 y.o. female who presents for evaluation of chest pain.  I saw her for palpitations in 2017 and leg swelling in 2019.  She has not required follow-up.  She is presenting with a new problem.  She is referred for evaluation of chest pain.  She is referred by Dr. Gaylan Gerold.  She says that about 4 to 5 weeks she has been getting chest discomfort.  This is happening with the most exerting think she does which is climb up a flight of stairs.  She can only make it to the landing halfway when she develops chest discomfort that is 8 out of 10 in intensity.  There can be some associated nausea.  It is her mid chest.  She might get somewhat short of breath with it.  She has had some diaphoresis at times.  She has to stop what she is doing and it slowly goes away after about 1/2-minute.  She has never had this kind of pain before.  She is otherwise not very active.  She has not had this pain at rest.  She does not have any resting shortness of breath, PND or orthopnea.  She does not have any palpitations, presyncope or syncope.  She does have cardiovascular risk factors with a strong family history of early coronary disease.    Past Medical History:  Diagnosis Date  . Allergy    latex  . Anemia   . Anxiety   . Arthritis   . Cancer ([place]) 01/2017   right breast cancer  . Depression   . Dyslipidemia   . Eustachian tube dysfunction   . GERD (gastroesophageal reflux disease)   . Hx of cardiovascular stress test    Myoview 10/13:  apical thinning, EF 72%, no ischemia  . Hx of colonic polyps    Dr Watt Climes  . Hypertension   . Hypothyroidism   . Other abnormal glucose     Past Surgical History:  Procedure Laterality Date  .  ABDOMINAL HYSTERECTOMY     BSO for uterine fibroids  . ACHILLES TENDON SURGERY Right 10/07/2012   Procedure: RIGHT REPAIR RUPTURE TENDON PRIMARY OPEN/PERCUTANEOUS ;  Surgeon: Ninetta Lights, MD;  Location: [facility];  Service: Orthopedics;  Laterality: Right;  . APPENDECTOMY    . BREAST LUMPECTOMY WITH RADIOACTIVE SEED AND SENTINEL LYMPH NODE BIOPSY Right 03/16/2017   Procedure: RIGHT BREAST LUMPECTOMY WITH RADIOACTIVE SEED AND RIGHT AXILLARY SENTINEL  NODE BIOPSY ERAS PATHWAY;  Surgeon: [name], MD;  Location: [facility];  Service: General;  Laterality: Right;  PECTORAL BLOCK  . CATARACT EXTRACTION, BILATERAL    . CHOLECYSTECTOMY    . COLONOSCOPY W/ POLYPECTOMY     Dr Watt Climes  . EAR CYST EXCISION Right 10/07/2012   Procedure: EXCISION BONE CYST BENIGN TUMOR [name] ;  Surgeon: Ninetta Lights, MD;  Location: [facility];  Service: Orthopedics;  Laterality: Right;  . EXPLORATORY LAPAROTOMY    . REVISION TOTAL SHOULDER TO REVERSE TOTAL SHOULDER Left 01/12/2019   Procedure: REVISION TOTAL SHOULDER TO REVERSE TOTAL SHOULDER;  Surgeon: Hiram Gash, MD;  Location: [facility];  Service: Orthopedics;  Laterality: Left;  . SINUS SURGERY WITH INSTATRAK    . TOTAL SHOULDER REPLACEMENT     Right shoulder  . 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 TUBAL LIGATION     Family History  Problem Relation Age of Onset  . Hiatal hernia Mother   . Heart attack Mother 31  . Heart disease Father 44       CAD @ autopsy  . Cancer Father        renal cancer  . Heart attack Brother 14       fatal  . Diabetes Neg Hx   . Stroke Neg Hx    Social History   Socioeconomic History  . Marital status: Married    Spouse name: Not on file  . Number of children: 1  . Years of education: Not on file  . Highest education level: Not on file  Occupational History  . Not on file  Tobacco Use  . Smoking status: Former Smoker    Packs/day: 0.50     Years: 6.00    Pack years: 3.00    Types: Cigarettes    Quit date: 06/30/1962    Years since quitting: 57.1  . Smokeless tobacco: Never Used  . Tobacco comment: Quit age 56  Substance and Sexual Activity  . Alcohol use: Yes    Alcohol/week: 3.0 standard drinks    Types: 3 Glasses of wine per week    Comment: wine  : < 3 glasses weekly  . Drug use: No  . Sexual activity: Yes    Birth control/protection: Post-menopausal, Surgical  Other Topics Concern  . Not on file  Social History Narrative   Lives husband and daughter lives with her.    Social Determinants of Health   Financial Resource Strain:   . Difficulty of Paying Living Expenses: Not on file  Food Insecurity:   . Worried About Charity fundraiser in the Last Year: Not on file  . Ran Out of Food in the Last Year: Not on file  Transportation Needs:   . Lack of Transportation (Medical): Not on file  . Lack of Transportation (Non-Medical): Not on file  Physical Activity:   . Days of Exercise per Week: Not on file  . Minutes of Exercise per Session: Not on file  Stress:   . Feeling of Stress : Not on file  Social Connections:   . Frequency of Communication with Friends and Family: Not on file  . Frequency of Social Gatherings with Friends and Family: Not on file  . Attends Religious Services: Not on file  . Active Member of Clubs or Organizations: Not on file  . Attends Archivist Meetings: Not on file  . Marital Status: Not on file  Intimate Partner Violence:   . Fear of Current or Ex-Partner: Not on file  . Emotionally Abused: Not on file  . Physically Abused: Not on file  . Sexually Abused: Not on file     Current Outpatient Medications  Medication Sig Dispense Refill  . atorvastatin (LIPITOR) 20 MG tablet TAKE 1 TABLET BY MOUTH AT BEDTIME 90 tablet 1  . celecoxib (CELEBREX) 200 MG capsule Take 1 capsule (200 mg total) by mouth 2 (two) times daily. 60 capsule 1  . diclofenac sodium (VOLTAREN) 1 % GEL  Apply 1 application topically 2 (two) times a day.    . dicyclomine (BENTYL) 10 MG capsule Take 10 mg by mouth daily.     Marland Kitchen  esomeprazole (NEXIUM 24HR) 20 MG capsule Take 40 mg by mouth daily.     . fluticasone (FLONASE) 50 MCG/ACT nasal spray Place 2 sprays into both nostrils daily.    . meloxicam (MOBIC) 15 MG tablet Take 15 mg by mouth daily as needed.    . metoprolol succinate (TOPROL-XL) 50 MG 24 hr tablet Take 1 tablet (50 mg total) by mouth daily. 90 tablet 3  . Multiple Vitamins-Minerals (CENTRUM SILVER 50+WOMEN) TABS Take 1 tablet by mouth daily.    . nitrofurantoin, macrocrystal-monohydrate, (MACROBID) 100 MG capsule Take 100 mg by mouth 2 (two) times daily.    Marland Kitchen nystatin (MYCOSTATIN/NYSTOP) powder Apply topically 4 (four) times daily. 15 g 0  . nystatin-triamcinolone ointment (MYCOLOG) Apply 1 application topically 2 (two) times daily. 60 g 0  . oxybutynin (DITROPAN) 5 MG tablet Take 5 mg by mouth 2 (two) times a day.    Vladimir Faster Glycol-Propyl Glycol (SYSTANE) 0.4-0.3 % GEL ophthalmic gel Place 1 application into both eyes 3 (three) times daily as needed (for dry eyes).    . pseudoephedrine (SUDAFED) 30 MG/5ML syrup Take 15 mg by mouth every 6 (six) hours as needed for congestion.    . Saline (AYR SALINE NASAL DROPS) 0.65 % (SOLN) SOLN Place 1 spray into the nose 3 (three) times daily as needed (for congestion).     Marland Kitchen spironolactone-hydrochlorothiazide (ALDACTAZIDE) 25-25 MG per tablet Take 1 tablet by mouth daily. 90 tablet 3  . SYNTHROID 50 MCG tablet PT NEEDS COMPLETE PHYSICAL.  TAKE 1 TABLET (50MCG TOTAL) BY MOUTH DAILY. 30 tablet 0  . tamoxifen (NOLVADEX) 20 MG tablet TAKE 1 TABLET BY MOUTH EVERY DAY 90 tablet 3  . tamsulosin (FLOMAX) 0.4 MG CAPS capsule Take 0.4 mg by mouth daily.    Marland Kitchen venlafaxine XR (EFFEXOR-XR) 150 MG 24 hr capsule TAKE 1 CAPSULE (150 MG TOTAL) BY MOUTH DAILY. 90 capsule 2  . verapamil (CALAN-SR) 240 MG CR tablet TAKE 1 TABLET (240 MG TOTAL) BY MOUTH AT  BEDTIME. 30 tablet 0  . zolpidem (AMBIEN) 5 MG tablet Take 5 mg by mouth at bedtime.     Marland Kitchen aspirin EC 81 MG tablet Take 1 tablet (81 mg total) by mouth daily. 90 tablet 3  . nitroGLYCERIN (NITROSTAT) 0.4 MG SL tablet Place 1 tablet (0.4 mg total) under the tongue every 5 (five) minutes as needed for chest pain. 25 tablet 3   No current facility-administered medications for this visit.    Allergies:   Latex, Aspirin, Oxycodone-acetaminophen, Sulfa antibiotics, and Sulfamethoxazole-trimethoprim    ROS:  Please see the history of present illness.   Otherwise, review of systems are positive for none.   All other systems are reviewed and negative.    PHYSICAL EXAM: VS:  BP 136/90   Pulse 89   Ht 5\' 3"  (1.6 m)   Wt 190 lb 9.6 oz (86.5 kg)   BMI 33.76 kg/m  , BMI Body mass index is 33.76 kg/m.  GENERAL:  Well appearing HEENT:  Pupils equal round and reactive, fundi not visualized, oral mucosa unremarkable NECK:  No jugular venous distention, waveform within normal limits, carotid upstroke brisk and symmetric, no bruits, no thyromegaly LYMPHATICS:  No cervical, inguinal adenopathy LUNGS:  Clear to auscultation bilaterally BACK:  No CVA tenderness CHEST:  Unremarkable HEART:  PMI not displaced or sustained,S1 and S2 within normal limits, no S3, no S4, no clicks, no rubs, no murmurs ABD:  Flat, positive bowel sounds normal in frequency in pitch,  no bruits, no rebound, no guarding, no midline pulsatile mass, no hepatomegaly, no splenomegaly EXT:  2 plus pulses throughout, left greater than right leg edema, no cyanosis no clubbing SKIN:  No rashes no nodules NEURO:  Cranial nerves II through XII grossly intact, motor grossly intact throughout PSYCH:  Cognitively intact, oriented to person place and time  EKG:  EKG is  ordered today. Sinus rhythm, rate 89, axis within normal limits, early transition lead V2, low voltage in the chest leads, QT prolonged, premature atrial  contractions.  Recent Labs: 02/03/2019: ALT 27; BUN 24; Creatinine, Ser 1.05; Hemoglobin 11.3; Platelets 270; Potassium 4.1; Sodium 132    Lipid Panel    Component Value Date/Time   CHOL 208 (H) 07/19/2012 1520   TRIG 152.0 (H) 07/19/2012 1520   HDL 70.30 07/19/2012 1520   CHOLHDL 3 07/19/2012 1520   VLDL 30.4 07/19/2012 1520   LDLCALC 91 06/13/2011 1134   LDLDIRECT 108.4 07/19/2012 1520      Wt Readings from Last 3 Encounters:  07/26/19 190 lb 9.6 oz (86.5 kg)  02/03/19 185 lb 1.6 oz (84 kg)  01/12/19 191 lb 4 oz (86.7 kg)      Other studies Reviewed: Additional studies/ records that were reviewed today include: None. Review of the above records demonstrates:  NA   ASSESSMENT AND PLAN:   CHEST PAIN:   The chest pain is consistent with unstable angina.  It is new onset and I think there is a high pretest probability of obstructive coronary disease.  Therefore, cardiac catheterization is indicated.  She will be started on aspirin 81 mg daily.  She gets sublingual nitroglycerin.  She will remain on the other meds as listed.  The patient understands that risks included but are not limited to stroke (1 in 1000), death (1 in 60), kidney failure [usually temporary] (1 in 500), bleeding (1 in 200), allergic reaction [possibly serious] (1 in 200).  The patient understands and agrees to proceed.   HTN: She will remain on the beta-blocker.  Blood pressure currently is controlled.  DYSLIPIDEMIA:  She has not had recent lipids and will need these at the time of her hospitalization if she is admitted post procedure.    COVID EDUCATION: She has received the vaccine.  Current medicines are reviewed at length with the patient today.  The patient does not have concerns regarding medicines.  The following changes have been made:  None  Labs/ tests ordered today include: As above.  Orders Placed This Encounter  Procedures  . CBC  . Basic metabolic panel  . EKG 12-Lead      Disposition:   FU with me after the above studies.     Signed, [name], MD  07/26/2019 5:40 PM    [facility]

## 2019-07-25 NOTE — Progress Notes (Signed)
Cardiology Office Note   Date:  07/26/2019   ID:  Brenda Stephens, Brenda Stephens [birth year], MRN [id number]  PCP:  Arlyss Repress, MD  Cardiologist:   [name], MD   Chief Complaint  Patient presents with  . Chest Pain      History of Present Illness: Brenda Stephens is a 79 y.o. female who presents for evaluation of chest pain.  I saw her for palpitations in 2017 and leg swelling in 2019.  She has not required follow-up.  She is presenting with a new problem.  She is referred for evaluation of chest pain.  She is referred by Dr. Gaylan Gerold.  She says that about 4 to 5 weeks she has been getting chest discomfort.  This is happening with the most exerting think she does which is climb up a flight of stairs.  She can only make it to the landing halfway when she develops chest discomfort that is 8 out of 10 in intensity.  There can be some associated nausea.  It is her mid chest.  She might get somewhat short of breath with it.  She has had some diaphoresis at times.  She has to stop what she is doing and it slowly goes away after about 1/2-minute.  She has never had this kind of pain before.  She is otherwise not very active.  She has not had this pain at rest.  She does not have any resting shortness of breath, PND or orthopnea.  She does not have any palpitations, presyncope or syncope.  She does have cardiovascular risk factors with a strong family history of early coronary disease.    Past Medical History:  Diagnosis Date  . Allergy    latex  . Anemia   . Anxiety   . Arthritis   . Cancer ([place]) 01/2017   right breast cancer  . Depression   . Dyslipidemia   . Eustachian tube dysfunction   . GERD (gastroesophageal reflux disease)   . Hx of cardiovascular stress test    Myoview 10/13:  apical thinning, EF 72%, no ischemia  . Hx of colonic polyps    Dr Watt Climes  . Hypertension   . Hypothyroidism   . Other abnormal glucose     Past Surgical History:  Procedure Laterality Date  .  ABDOMINAL HYSTERECTOMY     BSO for uterine fibroids  . ACHILLES TENDON SURGERY Right 10/07/2012   Procedure: RIGHT REPAIR RUPTURE TENDON PRIMARY OPEN/PERCUTANEOUS ;  Surgeon: Ninetta Lights, MD;  Location: [place];  Service: Orthopedics;  Laterality: Right;  . APPENDECTOMY    . BREAST LUMPECTOMY WITH RADIOACTIVE SEED AND SENTINEL LYMPH NODE BIOPSY Right 03/16/2017   Procedure: RIGHT BREAST LUMPECTOMY WITH RADIOACTIVE SEED AND RIGHT AXILLARY SENTINEL  NODE BIOPSY ERAS PATHWAY;  Surgeon: [name], MD;  Location: [place];  Service: General;  Laterality: Right;  PECTORAL BLOCK  . CATARACT EXTRACTION, BILATERAL    . CHOLECYSTECTOMY    . COLONOSCOPY W/ POLYPECTOMY     Dr Watt Climes  . EAR CYST EXCISION Right 10/07/2012   Procedure: EXCISION BONE CYST BENIGN TUMOR [name] ;  Surgeon: Ninetta Lights, MD;  Location: [facility];  Service: Orthopedics;  Laterality: Right;  . EXPLORATORY LAPAROTOMY    . REVISION TOTAL SHOULDER TO REVERSE TOTAL SHOULDER Left 01/12/2019   Procedure: REVISION TOTAL SHOULDER TO REVERSE TOTAL SHOULDER;  Surgeon: Hiram Gash, MD;  Location: [facility];  Service: Orthopedics;  Laterality: Left;  . SINUS SURGERY WITH INSTATRAK    . TOTAL SHOULDER REPLACEMENT     Right shoulder  . 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 TUBAL LIGATION     Family History  Problem Relation Age of Onset  . Hiatal hernia Mother   . Heart attack Mother 48  . Heart disease Father 62       CAD @ autopsy  . Cancer Father        renal cancer  . Heart attack Brother 70       fatal  . Diabetes Neg Hx   . Stroke Neg Hx    Social History   Socioeconomic History  . Marital status: Married    Spouse name: Not on file  . Number of children: 1  . Years of education: Not on file  . Highest education level: Not on file  Occupational History  . Not on file  Tobacco Use  . Smoking status: Former Smoker    Packs/day: 0.50     Years: 6.00    Pack years: 3.00    Types: Cigarettes    Quit date: 06/30/1962    Years since quitting: 57.1  . Smokeless tobacco: Never Used  . Tobacco comment: Quit age 37  Substance and Sexual Activity  . Alcohol use: Yes    Alcohol/week: 3.0 standard drinks    Types: 3 Glasses of wine per week    Comment: wine  : < 3 glasses weekly  . Drug use: No  . Sexual activity: Yes    Birth control/protection: Post-menopausal, Surgical  Other Topics Concern  . Not on file  Social History Narrative   Lives husband and daughter lives with her.    Social Determinants of Health   Financial Resource Strain:   . Difficulty of Paying Living Expenses: Not on file  Food Insecurity:   . Worried About Charity fundraiser in the Last Year: Not on file  . Ran Out of Food in the Last Year: Not on file  Transportation Needs:   . Lack of Transportation (Medical): Not on file  . Lack of Transportation (Non-Medical): Not on file  Physical Activity:   . Days of Exercise per Week: Not on file  . Minutes of Exercise per Session: Not on file  Stress:   . Feeling of Stress : Not on file  Social Connections:   . Frequency of Communication with Friends and Family: Not on file  . Frequency of Social Gatherings with Friends and Family: Not on file  . Attends Religious Services: Not on file  . Active Member of Clubs or Organizations: Not on file  . Attends Archivist Meetings: Not on file  . Marital Status: Not on file  Intimate Partner Violence:   . Fear of Current or Ex-Partner: Not on file  . Emotionally Abused: Not on file  . Physically Abused: Not on file  . Sexually Abused: Not on file     Current Outpatient Medications  Medication Sig Dispense Refill  . atorvastatin (LIPITOR) 20 MG tablet TAKE 1 TABLET BY MOUTH AT BEDTIME 90 tablet 1  . celecoxib (CELEBREX) 200 MG capsule Take 1 capsule (200 mg total) by mouth 2 (two) times daily. 60 capsule 1  . diclofenac sodium (VOLTAREN) 1 % GEL  Apply 1 application topically 2 (two) times a day.    . dicyclomine (BENTYL) 10 MG capsule Take 10 mg by mouth daily.     Marland Kitchen  esomeprazole (NEXIUM 24HR) 20 MG capsule Take 40 mg by mouth daily.     . fluticasone (FLONASE) 50 MCG/ACT nasal spray Place 2 sprays into both nostrils daily.    . meloxicam (MOBIC) 15 MG tablet Take 15 mg by mouth daily as needed.    . metoprolol succinate (TOPROL-XL) 50 MG 24 hr tablet Take 1 tablet (50 mg total) by mouth daily. 90 tablet 3  . Multiple Vitamins-Minerals (CENTRUM SILVER 50+WOMEN) TABS Take 1 tablet by mouth daily.    . nitrofurantoin, macrocrystal-monohydrate, (MACROBID) 100 MG capsule Take 100 mg by mouth 2 (two) times daily.    Marland Kitchen nystatin (MYCOSTATIN/NYSTOP) powder Apply topically 4 (four) times daily. 15 g 0  . nystatin-triamcinolone ointment (MYCOLOG) Apply 1 application topically 2 (two) times daily. 60 g 0  . oxybutynin (DITROPAN) 5 MG tablet Take 5 mg by mouth 2 (two) times a day.    Vladimir Faster Glycol-Propyl Glycol (SYSTANE) 0.4-0.3 % GEL ophthalmic gel Place 1 application into both eyes 3 (three) times daily as needed (for dry eyes).    . pseudoephedrine (SUDAFED) 30 MG/5ML syrup Take 15 mg by mouth every 6 (six) hours as needed for congestion.    . Saline (AYR SALINE NASAL DROPS) 0.65 % (SOLN) SOLN Place 1 spray into the nose 3 (three) times daily as needed (for congestion).     Marland Kitchen spironolactone-hydrochlorothiazide (ALDACTAZIDE) 25-25 MG per tablet Take 1 tablet by mouth daily. 90 tablet 3  . SYNTHROID 50 MCG tablet PT NEEDS COMPLETE PHYSICAL.  TAKE 1 TABLET (50MCG TOTAL) BY MOUTH DAILY. 30 tablet 0  . tamoxifen (NOLVADEX) 20 MG tablet TAKE 1 TABLET BY MOUTH EVERY DAY 90 tablet 3  . tamsulosin (FLOMAX) 0.4 MG CAPS capsule Take 0.4 mg by mouth daily.    Marland Kitchen venlafaxine XR (EFFEXOR-XR) 150 MG 24 hr capsule TAKE 1 CAPSULE (150 MG TOTAL) BY MOUTH DAILY. 90 capsule 2  . verapamil (CALAN-SR) 240 MG CR tablet TAKE 1 TABLET (240 MG TOTAL) BY MOUTH AT  BEDTIME. 30 tablet 0  . zolpidem (AMBIEN) 5 MG tablet Take 5 mg by mouth at bedtime.     Marland Kitchen aspirin EC 81 MG tablet Take 1 tablet (81 mg total) by mouth daily. 90 tablet 3  . nitroGLYCERIN (NITROSTAT) 0.4 MG SL tablet Place 1 tablet (0.4 mg total) under the tongue every 5 (five) minutes as needed for chest pain. 25 tablet 3   No current facility-administered medications for this visit.    Allergies:   Latex, Aspirin, Oxycodone-acetaminophen, Sulfa antibiotics, and Sulfamethoxazole-trimethoprim    ROS:  Please see the history of present illness.   Otherwise, review of systems are positive for none.   All other systems are reviewed and negative.    PHYSICAL EXAM: VS:  BP 136/90   Pulse 89   Ht 5\' 3"  (1.6 m)   Wt 190 lb 9.6 oz (86.5 kg)   BMI 33.76 kg/m  , BMI Body mass index is 33.76 kg/m.  GENERAL:  Well appearing HEENT:  Pupils equal round and reactive, fundi not visualized, oral mucosa unremarkable NECK:  No jugular venous distention, waveform within normal limits, carotid upstroke brisk and symmetric, no bruits, no thyromegaly LYMPHATICS:  No cervical, inguinal adenopathy LUNGS:  Clear to auscultation bilaterally BACK:  No CVA tenderness CHEST:  Unremarkable HEART:  PMI not displaced or sustained,S1 and S2 within normal limits, no S3, no S4, no clicks, no rubs, no murmurs ABD:  Flat, positive bowel sounds normal in frequency in pitch,  no bruits, no rebound, no guarding, no midline pulsatile mass, no hepatomegaly, no splenomegaly EXT:  2 plus pulses throughout, left greater than right leg edema, no cyanosis no clubbing SKIN:  No rashes no nodules NEURO:  Cranial nerves II through XII grossly intact, motor grossly intact throughout PSYCH:  Cognitively intact, oriented to person place and time  EKG:  EKG is  ordered today. Sinus rhythm, rate 89, axis within normal limits, early transition lead V2, low voltage in the chest leads, QT prolonged, premature atrial  contractions.  Recent Labs: 02/03/2019: ALT 27; BUN 24; Creatinine, Ser 1.05; Hemoglobin 11.3; Platelets 270; Potassium 4.1; Sodium 132    Lipid Panel    Component Value Date/Time   CHOL 208 (H) 07/19/2012 1520   TRIG 152.0 (H) 07/19/2012 1520   HDL 70.30 07/19/2012 1520   CHOLHDL 3 07/19/2012 1520   VLDL 30.4 07/19/2012 1520   LDLCALC 91 06/13/2011 1134   LDLDIRECT 108.4 07/19/2012 1520      Wt Readings from Last 3 Encounters:  07/26/19 190 lb 9.6 oz (86.5 kg)  02/03/19 185 lb 1.6 oz (84 kg)  01/12/19 191 lb 4 oz (86.7 kg)      Other studies Reviewed: Additional studies/ records that were reviewed today include: None. Review of the above records demonstrates:  NA   ASSESSMENT AND PLAN:   CHEST PAIN:   The chest pain is consistent with unstable angina.  It is new onset and I think there is a high pretest probability of obstructive coronary disease.  Therefore, cardiac catheterization is indicated.  She will be started on aspirin 81 mg daily.  She gets sublingual nitroglycerin.  She will remain on the other meds as listed.  The patient understands that risks included but are not limited to stroke (1 in 1000), death (1 in 29), kidney failure [usually temporary] (1 in 500), bleeding (1 in 200), allergic reaction [possibly serious] (1 in 200).  The patient understands and agrees to proceed.   HTN: She will remain on the beta-blocker.  Blood pressure currently is controlled.  DYSLIPIDEMIA:  She has not had recent lipids and will need these at the time of her hospitalization if she is admitted post procedure.    COVID EDUCATION: She has received the vaccine.  Current medicines are reviewed at length with the patient today.  The patient does not have concerns regarding medicines.  The following changes have been made:  None  Labs/ tests ordered today include: As above.  Orders Placed This Encounter  Procedures  . CBC  . Basic metabolic panel  . EKG 12-Lead      Disposition:   FU with me after the above studies.     Signed, [name], MD  07/26/2019 5:40 PM    [facility]

## 2019-07-26 ENCOUNTER — Other Ambulatory Visit: Payer: Self-pay

## 2019-07-26 ENCOUNTER — Ambulatory Visit (INDEPENDENT_AMBULATORY_CARE_PROVIDER_SITE_OTHER): Payer: Medicare Other | Admitting: Cardiology

## 2019-07-26 ENCOUNTER — Encounter: Payer: Self-pay | Admitting: Cardiology

## 2019-07-26 VITALS — BP 136/90 | HR 89 | Ht 63.0 in | Wt 190.6 lb

## 2019-07-26 DIAGNOSIS — Z01812 Encounter for preprocedural laboratory examination: Secondary | ICD-10-CM

## 2019-07-26 DIAGNOSIS — Z7189 Other specified counseling: Secondary | ICD-10-CM | POA: Diagnosis not present

## 2019-07-26 DIAGNOSIS — R072 Precordial pain: Secondary | ICD-10-CM | POA: Diagnosis not present

## 2019-07-26 DIAGNOSIS — M7989 Other specified soft tissue disorders: Secondary | ICD-10-CM

## 2019-07-26 MED ORDER — NITROGLYCERIN 0.4 MG SL SUBL: 0.4000 mg | SUBLINGUAL_TABLET | SUBLINGUAL | 3 refills | Status: DC | PRN

## 2019-07-26 MED ORDER — ASPIRIN EC 81 MG PO TBEC: 81.0000 mg | DELAYED_RELEASE_TABLET | Freq: Every day | ORAL | 3 refills | Status: DC

## 2019-07-26 NOTE — Progress Notes (Signed)
Subjective:   Patient ID: Brenda Stephens, female   DOB: 79 y.o.   MRN: [id number]   HPI Patient presents stating she still getting some pain but mostly swelling of her left ankle.  Patient states the the pain is mild and that its not burning or as deep as it was previously but the swelling can still be bothersome for her F2 neurovascular status intact with patient having negative Bevelyn Buckles' sign noted and noted to have discomfort still of the left ankle medial side but marginal limited intensity.  Patient is found to have good digital perfusion well oriented x3   ROS      Objective:  Physical Exam  Edema that is localized and may be due more to systemic vein issues versus any kind of localized tendinitis     Assessment:  Reviewed condition and discussed the different treatments and the different causes of swelling including local and systemic     Plan:  H&P spent a great deal of time going over swelling and possibility for tendinitis and today went ahead and applied compression stocking with instructions on usage.  Reappoint for [place] to recheck

## 2019-07-26 NOTE — Patient Instructions (Addendum)
Medication Instructions:  Start Aspirin 81mg  daily Start nitroglycerin as needed *If you need a refill on your cardiac medications before your next appointment, please call your pharmacy*  Lab Work: Your physician recommends that you return for lab work today (CBC, BMP)  You are scheduled for a COVID screening on Friday 07/29/19 at 09:25am, arrive at 09:10am. This is a Drive Up Visit at the [facility] [address] [place]. Someone will direct you to the appropriate testing line. Stay in your car and someone will be with you shortly.   If you have labs (blood work) drawn today and your tests are completely normal, you will receive your results only by: Marland Kitchen MyChart Message (if you have MyChart) OR . A paper copy in the mail If you have any lab test that is abnormal or we need to change your treatment, we will call you to review the results.  Testing/Procedures: None  Follow-Up: At [facility], you and your health needs are our priority.  As part of our continuing mission to provide you with exceptional heart care, we have created designated Provider Care Teams.  These Care Teams include your primary Cardiologist (physician) and Advanced Practice Providers (APPs -  Physician Assistants and Nurse Practitioners) who all work together to provide you with the care you need, when you need it.   Other Instructions:  [place] [address] [place] [address] Dept: [phone] Loc: [phone]  Brenda Stephens  07/26/2019  You are scheduled for a Cardiac Catheterization on Tuesday, February 2 with Dr. Peter Martinique.  1. Please arrive at the [facility] (Main Entrance A) at [facility]: [address] [place], [place] [address] at 7:00 AM (This time is two hours before your procedure to ensure your preparation). Free valet parking service is available.   Special note:  Every effort is made to have your procedure done on time. Please understand that emergencies sometimes delay scheduled procedures.  2. Diet: Do not eat solid foods after midnight.  The patient may have clear liquids until 5am upon the day of the procedure.  3. Labs: You will need to have blood drawn on Tuesday, January 26 at [place]  Open: 8am - 5pm (Lunch 12:30 - 1:30)   Phone: [phone]. You do not need to be fasting.  4. Medication instructions in preparation for your procedure:   Contrast Allergy: No  Hold spironolactone-hydrochlorothiazide (ALDACTAZIDE) and Tamsulosin on the morning of the Heart Cath  On the morning of your procedure, take your Aspirin and any morning medicines NOT listed above.  You may use sips of water.  5. Plan for one night stay--bring personal belongings. 6. Bring a current list of your medications and current insurance cards. 7. You MUST have a responsible person to drive you home. 8. Someone MUST be with you the first 24 hours after you arrive home or your discharge will be delayed. 9. Please wear clothes that are easy to get on and off and wear slip-on shoes.  Thank you for allowing [place] to care for you!   -- [facility]

## 2019-07-27 LAB — BASIC METABOLIC PANEL
BUN/Creatinine Ratio: 16 (ref 12–28)
BUN: 15 mg/dL (ref 8–27)
CO2: 21 mmol/L (ref 20–29)
Calcium: 9.6 mg/dL (ref 8.7–10.3)
Chloride: 101 mmol/L (ref 96–106)
Creatinine, Ser: 0.96 mg/dL (ref 0.57–1.00)
GFR calc Af Amer: 66 mL/min/{1.73_m2} (ref >59)
GFR calc non Af Amer: 57 mL/min/{1.73_m2} — ABNORMAL LOW (ref >59)
Glucose: 134 mg/dL — ABNORMAL HIGH (ref 65–99)
Potassium: 4.7 mmol/L (ref 3.5–5.2)
Sodium: 138 mmol/L (ref 134–144)

## 2019-07-27 LAB — CBC
Hematocrit: 36.9 % (ref 34.0–46.6)
Hemoglobin: 12.5 g/dL (ref 11.1–15.9)
MCH: 31.2 pg (ref 26.6–33.0)
MCHC: 33.9 g/dL (ref 31.5–35.7)
MCV: 92 fL (ref 79–97)
Platelets: 234 10*3/uL (ref 150–450)
RBC: 4.01 x10E6/uL (ref 3.77–5.28)
RDW: 12.6 % (ref 11.7–15.4)
WBC: 7.7 10*3/uL (ref 3.4–10.8)

## 2019-07-29 ENCOUNTER — Other Ambulatory Visit (HOSPITAL_COMMUNITY)
Admission: RE | Admit: 2019-07-29 | Discharge: 2019-07-29 | Disposition: A | Discharge status: Home / Self Care | Payer: Medicare Other | Source: Ambulatory Visit | Attending: Cardiology | Admitting: Cardiology

## 2019-07-29 DIAGNOSIS — Z01812 Encounter for preprocedural laboratory examination: Secondary | ICD-10-CM | POA: Diagnosis present

## 2019-07-29 DIAGNOSIS — Z20822 Contact with and (suspected) exposure to covid-19: Secondary | ICD-10-CM | POA: Diagnosis not present

## 2019-07-29 LAB — SARS CORONAVIRUS 2 (TAT 6-24 HRS): SARS Coronavirus 2: NEGATIVE

## 2019-08-01 ENCOUNTER — Telehealth: Payer: Self-pay | Admitting: *Deleted

## 2019-08-01 NOTE — Telephone Encounter (Signed)
Pt contacted pre-catheterization scheduled at [facility] for: Tuesday August 02, 2019 9 AM Verified arrival time and place: [facility]) at: 7 AM   No solid food after midnight prior to cath, clear liquids until 5 AM day of procedure. Contrast allergy: no  Hold: Aldactazide-day before and day of procedure-GFR 57-pt had already taken today  AM meds can be  taken pre-cath with sip of water including: ASA 81 mg   Confirmed patient has responsible adult to drive home post procedure and observe 24 hours after arriving home: yes  Currently, due to Covid-19 pandemic, only one person will be allowed with patient. Must be the same person for patient's entire stay and will be required to wear a mask. They will be asked to wait in the waiting room for the duration of the patient's stay.  Patients are required to wear a mask when they enter the hospital.     COVID-19 Pre-Screening Questions:  . In the past 7 to 10 days have you had a cough,  shortness of breath, headache, congestion, fever (100 or greater) body aches, chills, sore throat, or sudden loss of taste or sense of smell? no . Have you been around anyone with known Covid 19? no . Have you been around anyone who is awaiting Covid 19 test results in the past 7 to 10 days? no . Have you been around anyone who has been exposed to Covid 19, or has mentioned symptoms of Covid 19 within the past 7 to 10 days? no    I reviewed procedure/mask/visitor instructions, Covid-19 screening questions with patient, she verbalized understanding, thanked me for call.

## 2019-08-02 ENCOUNTER — Other Ambulatory Visit: Payer: Self-pay

## 2019-08-02 ENCOUNTER — Encounter (HOSPITAL_COMMUNITY)
Admission: RE | Disposition: A | Discharge status: Home / Self Care | Payer: Self-pay | Source: Home / Self Care | Attending: Cardiology

## 2019-08-02 ENCOUNTER — Ambulatory Visit (HOSPITAL_COMMUNITY)
Admission: RE | Admit: 2019-08-02 | Discharge: 2019-08-02 | Disposition: A | Discharge status: Home / Self Care | Payer: Medicare Other | Attending: Cardiology | Admitting: Cardiology

## 2019-08-02 DIAGNOSIS — Z91040 Latex allergy status: Secondary | ICD-10-CM | POA: Insufficient documentation

## 2019-08-02 DIAGNOSIS — Z87891 Personal history of nicotine dependence: Secondary | ICD-10-CM | POA: Insufficient documentation

## 2019-08-02 DIAGNOSIS — Z882 Allergy status to sulfonamides status: Secondary | ICD-10-CM | POA: Insufficient documentation

## 2019-08-02 DIAGNOSIS — Z79890 Hormone replacement therapy (postmenopausal): Secondary | ICD-10-CM | POA: Diagnosis not present

## 2019-08-02 DIAGNOSIS — Z8249 Family history of ischemic heart disease and other diseases of the circulatory system: Secondary | ICD-10-CM | POA: Diagnosis not present

## 2019-08-02 DIAGNOSIS — E039 Hypothyroidism, unspecified: Secondary | ICD-10-CM | POA: Insufficient documentation

## 2019-08-02 DIAGNOSIS — R079 Chest pain, unspecified: Secondary | ICD-10-CM

## 2019-08-02 DIAGNOSIS — Z886 Allergy status to analgesic agent status: Secondary | ICD-10-CM | POA: Diagnosis not present

## 2019-08-02 DIAGNOSIS — Z881 Allergy status to other antibiotic agents status: Secondary | ICD-10-CM | POA: Insufficient documentation

## 2019-08-02 DIAGNOSIS — I10 Essential (primary) hypertension: Secondary | ICD-10-CM | POA: Diagnosis not present

## 2019-08-02 DIAGNOSIS — E785 Hyperlipidemia, unspecified: Secondary | ICD-10-CM | POA: Diagnosis not present

## 2019-08-02 DIAGNOSIS — Z79899 Other long term (current) drug therapy: Secondary | ICD-10-CM | POA: Insufficient documentation

## 2019-08-02 DIAGNOSIS — Z791 Long term (current) use of non-steroidal anti-inflammatories (NSAID): Secondary | ICD-10-CM | POA: Diagnosis not present

## 2019-08-02 DIAGNOSIS — Z7982 Long term (current) use of aspirin: Secondary | ICD-10-CM | POA: Insufficient documentation

## 2019-08-02 DIAGNOSIS — M1990 Unspecified osteoarthritis, unspecified site: Secondary | ICD-10-CM | POA: Insufficient documentation

## 2019-08-02 DIAGNOSIS — I200 Unstable angina: Secondary | ICD-10-CM | POA: Diagnosis present

## 2019-08-02 DIAGNOSIS — K219 Gastro-esophageal reflux disease without esophagitis: Secondary | ICD-10-CM | POA: Insufficient documentation

## 2019-08-02 HISTORY — PX: LEFT HEART CATH AND CORONARY ANGIOGRAPHY: CATH118249

## 2019-08-02 SURGERY — LEFT HEART CATH AND CORONARY ANGIOGRAPHY
Anesthesia: LOCAL

## 2019-08-02 MED ORDER — MIDAZOLAM HCL 2 MG/2ML IJ SOLN
INTRAMUSCULAR | Status: AC
  Filled 2019-08-02: qty 2

## 2019-08-02 MED ORDER — FENTANYL CITRATE (PF) 100 MCG/2ML IJ SOLN
INTRAMUSCULAR | Status: AC
  Filled 2019-08-02: qty 2

## 2019-08-02 MED ORDER — SODIUM CHLORIDE 0.9 % IV SOLN: 250.0000 mL | INTRAVENOUS | Status: DC | PRN

## 2019-08-02 MED ORDER — HEPARIN (PORCINE) IN NACL 1000-0.9 UT/500ML-% IV SOLN
INTRAVENOUS | Status: AC
  Filled 2019-08-02: qty 500

## 2019-08-02 MED ORDER — SODIUM CHLORIDE 0.9% FLUSH: 3.0000 mL | INTRAVENOUS | Status: DC | PRN

## 2019-08-02 MED ORDER — IOHEXOL 350 MG/ML SOLN
INTRAVENOUS | Status: DC | PRN
  Administered 2019-08-02: 65 mL via INTRA_ARTERIAL

## 2019-08-02 MED ORDER — HEPARIN SODIUM (PORCINE) 1000 UNIT/ML IJ SOLN
INTRAMUSCULAR | Status: AC
  Filled 2019-08-02: qty 1

## 2019-08-02 MED ORDER — ASPIRIN 81 MG PO CHEW: 81.0000 mg | CHEWABLE_TABLET | ORAL | Status: DC

## 2019-08-02 MED ORDER — SODIUM CHLORIDE 0.9% FLUSH: 3.0000 mL | Freq: Two times a day (BID) | INTRAVENOUS | Status: DC

## 2019-08-02 MED ORDER — LIDOCAINE HCL (PF) 1 % IJ SOLN
INTRAMUSCULAR | Status: AC
  Filled 2019-08-02: qty 30

## 2019-08-02 MED ORDER — HEPARIN SODIUM (PORCINE) 1000 UNIT/ML IJ SOLN
INTRAMUSCULAR | Status: DC | PRN
  Administered 2019-08-02: 4500 [IU] via INTRAVENOUS

## 2019-08-02 MED ORDER — MIDAZOLAM HCL 2 MG/2ML IJ SOLN
INTRAMUSCULAR | Status: DC | PRN
  Administered 2019-08-02: 1 mg via INTRAVENOUS

## 2019-08-02 MED ORDER — SODIUM CHLORIDE 0.9 % WEIGHT BASED INFUSION
3.0000 mL/kg/h | INTRAVENOUS | Status: AC
  Administered 2019-08-02: 3 mL/kg/h via INTRAVENOUS

## 2019-08-02 MED ORDER — ACETAMINOPHEN 325 MG PO TABS: 650.0000 mg | ORAL_TABLET | ORAL | Status: DC | PRN

## 2019-08-02 MED ORDER — FENTANYL CITRATE (PF) 100 MCG/2ML IJ SOLN
INTRAMUSCULAR | Status: DC | PRN
  Administered 2019-08-02: 25 ug via INTRAVENOUS

## 2019-08-02 MED ORDER — SODIUM CHLORIDE 0.9 % WEIGHT BASED INFUSION: 1.0000 mL/kg/h | INTRAVENOUS | Status: DC

## 2019-08-02 MED ORDER — VERAPAMIL HCL 2.5 MG/ML IV SOLN
INTRAVENOUS | Status: DC | PRN
  Administered 2019-08-02: 09:00:00 10 mL via INTRA_ARTERIAL

## 2019-08-02 MED ORDER — ONDANSETRON HCL 4 MG/2ML IJ SOLN: 4.0000 mg | Freq: Four times a day (QID) | INTRAMUSCULAR | Status: DC | PRN

## 2019-08-02 MED ORDER — HEPARIN (PORCINE) IN NACL 1000-0.9 UT/500ML-% IV SOLN
INTRAVENOUS | Status: DC | PRN
  Administered 2019-08-02 (×3): 500 mL

## 2019-08-02 MED ORDER — LIDOCAINE HCL (PF) 1 % IJ SOLN
INTRAMUSCULAR | Status: DC | PRN
  Administered 2019-08-02: 2 mL via INTRADERMAL

## 2019-08-02 MED ORDER — VERAPAMIL HCL 2.5 MG/ML IV SOLN
INTRAVENOUS | Status: AC
  Filled 2019-08-02: qty 2

## 2019-08-02 SURGICAL SUPPLY — 12 items
CATH 5FR JL3.5 JR4 ANG PIG MP (CATHETERS) ×1 IMPLANT
DEVICE RAD COMP TR BAND LRG (VASCULAR PRODUCTS) ×1 IMPLANT
GLIDESHEATH SLEND SS 6F .021 (SHEATH) ×1 IMPLANT
GUIDEWIRE INQWIRE 1.5J.035X260 (WIRE) IMPLANT
INQWIRE 1.5J .035X260CM (WIRE) ×2
KIT HEART LEFT (KITS) ×2 IMPLANT
PACK CARDIAC CATHETERIZATION (CUSTOM PROCEDURE TRAY) ×2 IMPLANT
SHEATH PROBE COVER 6X72 (BAG) ×1 IMPLANT
SYR MEDRAD MARK 7 150ML (SYRINGE) ×2 IMPLANT
TRANSDUCER W/STOPCOCK (MISCELLANEOUS) ×2 IMPLANT
TUBING CIL FLEX 10 FLL-RA (TUBING) ×2 IMPLANT
WIRE HI TORQ VERSACORE-J 145CM (WIRE) ×1 IMPLANT

## 2019-08-02 NOTE — Progress Notes (Addendum)
Ambulated to bathroom to void tol well. Assisted pt to chair 2 pillows place under her right arm. Instructed pt to call if any bleeding , swelling, or pain to right wrist TRB site. Voices understanding.

## 2019-08-02 NOTE — Interval H&P Note (Signed)
History and Physical Interval Note:  08/02/2019 8:44 AM  Brenda Stephens  has presented today for surgery, with the diagnosis of unstable angina.  The various methods of treatment have been discussed with the patient and family. After consideration of risks, benefits and other options for treatment, the patient has consented to  Procedure(s): LEFT HEART CATH AND CORONARY ANGIOGRAPHY (N/A) as a surgical intervention.  The patient's history has been reviewed, patient examined, no change in status, stable for surgery.  I have reviewed the patient's chart and labs.  Questions were answered to the patient's satisfaction.   Cath Lab Visit (complete for each Cath Lab visit)  Clinical Evaluation Leading to the Procedure:   ACS: Yes.    Non-ACS:    Anginal Classification: CCS III  Anti-ischemic medical therapy: Minimal Therapy (1 class of medications)  Non-Invasive Test Results: No non-invasive testing performed  Prior CABG: No previous CABG        [facility] 08/02/2019 8:44 AM

## 2019-08-02 NOTE — Progress Notes (Addendum)
Discharge instructions reviewed with pt and her husband (via telephone) both voice understanding.  

## 2019-08-02 NOTE — Progress Notes (Signed)
Arm splint applied to right arm.  

## 2019-08-02 NOTE — Discharge Instructions (Signed)
Drink plenty  Of fluids keep right arm at or above heart level  Radial Site Care  This sheet gives you information about how to care for yourself after your procedure. Your health care provider may also give you more specific instructions. If you have problems or questions, contact your health care provider. What can I expect after the procedure? After the procedure, it is common to have:  Bruising and tenderness at the catheter insertion area. Follow these instructions at home: Medicines  Take over-the-counter and prescription medicines only as told by your health care provider. Insertion site care  Follow instructions from your health care provider about how to take care of your insertion site. Make sure you: ? Wash your hands with soap and water before you change your bandage (dressing). If soap and water are not available, use hand sanitizer. ? Change your dressing as told by your health care provider. ? Leave stitches (sutures), skin glue, or adhesive strips in place. These skin closures may need to stay in place for 2 weeks or longer. If adhesive strip edges start to loosen and curl up, you may trim the loose edges. Do not remove adhesive strips completely unless your health care provider tells you to do that.  Check your insertion site every day for signs of infection. Check for: ? Redness, swelling, or pain. ? Fluid or blood. ? Pus or a bad smell. ? Warmth.  Do not take baths, swim, or use a hot tub until your health care provider approves.  You may shower 24-48 hours after the procedure, or as directed by your health care provider. ? Remove the dressing and gently wash the site with plain soap and water. ? Pat the area dry with a clean towel. ? Do not rub the site. That could cause bleeding.  Do not apply powder or lotion to the site. Activity   For 24 hours after the procedure, or as directed by your health care provider: ? Do not flex or bend the affected arm. ? Do  not push or pull heavy objects with the affected arm. ? Do not drive yourself home from the hospital or clinic. You may drive 24 hours after the procedure unless your health care provider tells you not to. ? Do not operate machinery or power tools.  Do not lift anything that is heavier than 10 lb (4.5 kg), or the limit that you are told, until your health care provider says that it is safe.  Ask your health care provider when it is okay to: ? Return to work or school. ? Resume usual physical activities or sports. ? Resume sexual activity. General instructions  If the catheter site starts to bleed, raise your arm and put firm pressure on the site. If the bleeding does not stop, get help right away. This is a medical emergency.  If you went home on the same day as your procedure, a responsible adult should be with you for the first 24 hours after you arrive home.  Keep all follow-up visits as told by your health care provider. This is important. Contact a health care provider if:  You have a fever.  You have redness, swelling, or yellow drainage around your insertion site. Get help right away if:  You have unusual pain at the radial site.  The catheter insertion area swells very fast.  The insertion area is bleeding, and the bleeding does not stop when you hold steady pressure on the area.  Your arm or  hand becomes pale, cool, tingly, or numb. These symptoms may represent a serious problem that is an emergency. Do not wait to see if the symptoms will go away. Get medical help right away. Call your local emergency services (911 in the [place] Do not drive yourself to the hospital. Summary  After the procedure, it is common to have bruising and tenderness at the site.  Follow instructions from your health care provider about how to take care of your radial site wound. Check the wound every day for signs of infection.  Do not lift anything that is heavier than 10 lb (4.5 kg), or the  limit that you are told, until your health care provider says that it is safe. This information is not intended to replace advice given to you by your health care provider. Make sure you discuss any questions you have with your health care provider. Document Revised: 07/22/2017 Document Reviewed: 07/22/2017 Elsevier Patient Education  2020 [employer].

## 2019-08-23 NOTE — Progress Notes (Signed)
Cardiology Office Note   Date:  08/24/2019   ID:  Brenda Stephens, Brenda Stephens 31-Oct-1940, MRN [id number]  PCP:  Arlyss Repress, MD  Cardiologist: Dr.Hochrein  CC: Follow Up   History of Present Illness: Brenda Stephens is a 79 y.o. female who presents for ongoing assessment and management of chest pain.  The patient had cardiac catheterization on 08/02/2019 by Dr. Martinique revealing normal coronary anatomy, normal LV function, normal LVEDP.  She was sent for cardiac catheterization as a result of frequent chest pain and angina with associated shortness of breath with minimal exertion.  She had strong cardiovascular risk factors to include family history of early CAD.  Other history includes hypertension, hypothyroidism, anemia, anxiety, arthritis, dyslipidemia, and depression.  She is here today for follow-up to discuss test results.  She comes today feeling well.  She continues to have some epigastric pain and she states that she is due to have a CT scan of her abdomen coming up.  She denies any further severe discomfort her breathing status remains stable.  She is medically compliant.  Past Medical History:  Diagnosis Date  . Allergy    latex  . Anemia   . Anxiety   . Arthritis   . Cancer ([place]) 01/2017   right breast cancer  . Depression   . Dyslipidemia   . Eustachian tube dysfunction   . GERD (gastroesophageal reflux disease)   . Hx of cardiovascular stress test    Myoview 10/13:  apical thinning, EF 72%, no ischemia  . Hx of colonic polyps    Dr Watt Climes  . Hypertension   . Hypothyroidism   . Other abnormal glucose     Past Surgical History:  Procedure Laterality Date  . ABDOMINAL HYSTERECTOMY     BSO for uterine fibroids  . ACHILLES TENDON SURGERY Right 10/07/2012   Procedure: RIGHT REPAIR RUPTURE TENDON PRIMARY OPEN/PERCUTANEOUS ;  Surgeon: Ninetta Lights, MD;  Location: [facility];  Service: Orthopedics;  Laterality: Right;  . APPENDECTOMY    . BREAST LUMPECTOMY  WITH RADIOACTIVE SEED AND SENTINEL LYMPH NODE BIOPSY Right 03/16/2017   Procedure: RIGHT BREAST LUMPECTOMY WITH RADIOACTIVE SEED AND RIGHT AXILLARY SENTINEL  NODE BIOPSY ERAS PATHWAY;  Surgeon: [name], MD;  Location: [facility];  Service: General;  Laterality: Right;  PECTORAL BLOCK  . CATARACT EXTRACTION, BILATERAL    . CHOLECYSTECTOMY    . COLONOSCOPY W/ POLYPECTOMY     Dr Watt Climes  . EAR CYST EXCISION Right 10/07/2012   Procedure: EXCISION BONE CYST BENIGN TUMOR [name] ;  Surgeon: Ninetta Lights, MD;  Location: [facility];  Service: Orthopedics;  Laterality: Right;  . EXPLORATORY LAPAROTOMY    . LEFT HEART CATH AND CORONARY ANGIOGRAPHY N/A 08/02/2019   Procedure: LEFT HEART CATH AND CORONARY ANGIOGRAPHY;  Surgeon: Martinique, Peter M, MD;  Location: [facility];  Service: Cardiovascular;  Laterality: N/A;  . REVISION TOTAL SHOULDER TO REVERSE TOTAL SHOULDER Left 01/12/2019   Procedure: REVISION TOTAL SHOULDER TO REVERSE TOTAL SHOULDER;  Surgeon: Hiram Gash, MD;  Location: [facility];  Service: Orthopedics;  Laterality: Left;  . SINUS SURGERY WITH INSTATRAK    . TOTAL SHOULDER REPLACEMENT     Right shoulder  . 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 TUBAL LIGATION       Current Outpatient Medications  Medication Sig Dispense Refill  . acetaminophen (TYLENOL) 500 MG tablet Take 1,000 mg by mouth 2 (  two) times daily as needed for moderate pain.    Marland Kitchen aspirin EC 81 MG tablet Take 1 tablet (81 mg total) by mouth daily. 90 tablet 3  . atorvastatin (LIPITOR) 20 MG tablet TAKE 1 TABLET BY MOUTH AT BEDTIME (Patient taking differently: Take 20 mg by mouth at bedtime. ) 90 tablet 1  . celecoxib (CELEBREX) 200 MG capsule Take 1 capsule (200 mg total) by mouth 2 (two) times daily. 60 capsule 1  . diclofenac sodium (VOLTAREN) 1 % GEL Apply 1 application topically 2 (two) times a day.    . dicyclomine (BENTYL) 10 MG capsule Take 20 mg by mouth 4  (four) times daily -  before meals and at bedtime.     Marland Kitchen esomeprazole (NEXIUM) 20 MG capsule Take 20 mg by mouth at bedtime.    . Homeopathic Products (ARNICARE EX) Apply 1 application topically daily as needed (pain).    . metoprolol succinate (TOPROL-XL) 50 MG 24 hr tablet Take 1 tablet (50 mg total) by mouth daily. 90 tablet 3  . nitroGLYCERIN (NITROSTAT) 0.4 MG SL tablet Place 1 tablet (0.4 mg total) under the tongue every 5 (five) minutes as needed for chest pain. 25 tablet 3  . Phenylephrine HCl (SUDAFED PE CHILDRENS) 2.5 MG/5ML SOLN Take 5 mLs by mouth daily.    Marland Kitchen PRESCRIPTION MEDICATION Apply 1 application topically 2 (two) times daily. Triamcinolone cream compounded with SSD cream 1:1    . sennosides-docusate sodium (SENOKOT-S) 8.6-50 MG tablet Take 2 tablets by mouth daily as needed for constipation.    Marland Kitchen spironolactone-hydrochlorothiazide (ALDACTAZIDE) 25-25 MG per tablet Take 1 tablet by mouth daily. 90 tablet 3  . SYNTHROID 50 MCG tablet PT NEEDS COMPLETE PHYSICAL.  TAKE 1 TABLET (50MCG TOTAL) BY MOUTH DAILY. (Patient taking differently: Take 50 mcg by mouth daily. ) 30 tablet 0  . tamoxifen (NOLVADEX) 20 MG tablet TAKE 1 TABLET BY MOUTH EVERY DAY (Patient taking differently: Take 20 mg by mouth daily. ) 90 tablet 3  . tamsulosin (FLOMAX) 0.4 MG CAPS capsule Take 0.4 mg by mouth daily.    Marland Kitchen tolterodine (DETROL LA) 2 MG 24 hr capsule Take 2 mg by mouth daily.    Marland Kitchen venlafaxine XR (EFFEXOR-XR) 150 MG 24 hr capsule TAKE 1 CAPSULE (150 MG TOTAL) BY MOUTH DAILY. (Patient taking differently: Take 150 mg by mouth daily. ) 90 capsule 2  . verapamil (CALAN-SR) 240 MG CR tablet TAKE 1 TABLET (240 MG TOTAL) BY MOUTH AT BEDTIME. (Patient taking differently: Take 240 mg by mouth at bedtime. ) 30 tablet 0  . zolpidem (AMBIEN) 5 MG tablet Take 5 mg by mouth at bedtime.      No current facility-administered medications for this visit.    Allergies:   Latex, Aspirin, Oxycodone-acetaminophen, Sulfa  antibiotics, and Sulfamethoxazole-trimethoprim    Social History:  The patient  reports that she quit smoking about 57 years ago. Her smoking use included cigarettes. She has a 3.00 pack-year smoking history. She has never used smokeless tobacco. She reports current alcohol use of about 3.0 standard drinks of alcohol per week. She reports that she does not use drugs.   Family History:  The patient's family history includes Cancer in her father; Heart attack (age of onset: 66) in her brother; Heart attack (age of onset: 9) in her mother; Heart disease (age of onset: 18) in her father; Hiatal hernia in her mother.    ROS: All other systems are reviewed and negative. Unless otherwise mentioned in  H&P    PHYSICAL EXAM: VS:  BP 127/72   Pulse 75   Ht 5\' 3"  (1.6 m)   Wt 185 lb 12.8 oz (84.3 kg)   SpO2 98%   BMI 32.91 kg/m  , BMI Body mass index is 32.91 kg/m. GEN: Well nourished, well developed, in no acute distress HEENT: normal Neck: no JVD, carotid bruits, or masses Cardiac: RRR; no murmurs, rubs, or gallops,no edema  Respiratory:  Clear to auscultation bilaterally, normal work of breathing GI: soft, nontender, nondistended, + BS MS: no deformity or atrophy Skin: warm and dry, no rash Neuro:  Strength and sensation are intact Psych: euthymic mood, full affect   EKG:  Not completed this office visit.   Recent Labs: 02/03/2019: ALT 27 07/26/2019: BUN 15; Creatinine, Ser 0.96; Hemoglobin 12.5; Platelets 234; Potassium 4.7; Sodium 138    Lipid Panel    Component Value Date/Time   CHOL 208 (H) 07/19/2012 1520   TRIG 152.0 (H) 07/19/2012 1520   HDL 70.30 07/19/2012 1520   CHOLHDL 3 07/19/2012 1520   VLDL 30.4 07/19/2012 1520   LDLCALC 91 06/13/2011 1134   LDLDIRECT 108.4 07/19/2012 1520      Wt Readings from Last 3 Encounters:  08/24/19 185 lb 12.8 oz (84.3 kg)  08/02/19 185 lb (83.9 kg)  07/26/19 190 lb 9.6 oz (86.5 kg)      Other studies Reviewed: [facility] 08/02/2019    The left ventricular systolic function is normal.  LV end diastolic pressure is normal.  The left ventricular ejection fraction is 55-65% by visual estimate.   1. Normal coronary anatomy 2. Normal LV function 3. Normal LVEDP   ASSESSMENT AND PLAN:  1.  Noncardiac chest pain: Normal cardiac catheterization without evidence of CAD dated 08/02/2019.  Normal LV function with normal LVEDP.  She will be seen by cardiology as needed.  Continue risk factor modification to include blood pressure control, low-cholesterol diet, and increased exercise.  2.  Hyperlipidemia: Continue atorvastatin 20 mg daily labs to be followed by PCP.  3.  Hypertension: Excellent control of blood pressure today.  She will continue on metoprolol, spironolactone HCTZ, and verapamil.  4.  Hypothyroidism: Continue Synthroid 50 mcg daily.  Followed by PCP.  Current medicines are reviewed at length with the patient today.  I have spent 20 minutes  dedicated to the care of this patient on the date of this encounter to include pre-visit review of records, assessment, management and diagnostic testing,with shared decision making.  Labs/ tests ordered today include:None  Phill Myron. West Pugh, ANP, AACC   08/24/2019 10:12 AM    [address] Office [phone] Fax [phone]  Notice: This dictation was prepared with Dragon dictation along with smaller phrase technology. Any transcriptional errors that result from this process are unintentional and may not be corrected upon review.

## 2019-08-24 ENCOUNTER — Other Ambulatory Visit: Payer: Self-pay

## 2019-08-24 ENCOUNTER — Ambulatory Visit (INDEPENDENT_AMBULATORY_CARE_PROVIDER_SITE_OTHER): Payer: MEDICARE | Admitting: Adult Health

## 2019-08-24 ENCOUNTER — Encounter: Payer: Self-pay | Admitting: Adult Health

## 2019-08-24 VITALS — BP 127/72 | HR 75 | Ht 63.0 in | Wt 185.8 lb

## 2019-08-24 DIAGNOSIS — E785 Hyperlipidemia, unspecified: Secondary | ICD-10-CM

## 2019-08-24 DIAGNOSIS — R079 Chest pain, unspecified: Secondary | ICD-10-CM | POA: Diagnosis not present

## 2019-08-24 DIAGNOSIS — E039 Hypothyroidism, unspecified: Secondary | ICD-10-CM

## 2019-08-24 DIAGNOSIS — I10 Essential (primary) hypertension: Secondary | ICD-10-CM

## 2019-08-24 NOTE — Patient Instructions (Signed)
Medication Instructions:  Continue current medications  *If you need a refill on your cardiac medications before your next appointment, please call your pharmacy*  Lab Work: None Ordered  Testing/Procedures: None ordered  Follow-Up: At [facility], you and your health needs are our priority.  As part of our continuing mission to provide you with exceptional heart care, we have created designated Provider Care Teams.  These Care Teams include your primary Cardiologist (physician) and Advanced Practice Providers (APPs -  Physician Assistants and Nurse Practitioners) who all work together to provide you with the care you need, when you need it.  Your next appointment:   As Needed

## 2019-09-10 ENCOUNTER — Emergency Department (HOSPITAL_COMMUNITY)
Admission: EM | Admit: 2019-09-10 | Discharge: 2019-09-10 | Disposition: A | Discharge status: Home / Self Care | Payer: MEDICARE | Attending: Emergency Medicine | Admitting: Emergency Medicine

## 2019-09-10 ENCOUNTER — Other Ambulatory Visit: Payer: Self-pay

## 2019-09-10 DIAGNOSIS — Z96611 Presence of right artificial shoulder joint: Secondary | ICD-10-CM | POA: Diagnosis not present

## 2019-09-10 DIAGNOSIS — E039 Hypothyroidism, unspecified: Secondary | ICD-10-CM | POA: Diagnosis not present

## 2019-09-10 DIAGNOSIS — Z7982 Long term (current) use of aspirin: Secondary | ICD-10-CM | POA: Insufficient documentation

## 2019-09-10 DIAGNOSIS — R110 Nausea: Secondary | ICD-10-CM | POA: Diagnosis not present

## 2019-09-10 DIAGNOSIS — K921 Melena: Secondary | ICD-10-CM | POA: Diagnosis not present

## 2019-09-10 DIAGNOSIS — Z853 Personal history of malignant neoplasm of breast: Secondary | ICD-10-CM | POA: Insufficient documentation

## 2019-09-10 DIAGNOSIS — Z91040 Latex allergy status: Secondary | ICD-10-CM | POA: Insufficient documentation

## 2019-09-10 DIAGNOSIS — I10 Essential (primary) hypertension: Secondary | ICD-10-CM | POA: Insufficient documentation

## 2019-09-10 DIAGNOSIS — Z87891 Personal history of nicotine dependence: Secondary | ICD-10-CM | POA: Diagnosis not present

## 2019-09-10 DIAGNOSIS — Z79899 Other long term (current) drug therapy: Secondary | ICD-10-CM | POA: Insufficient documentation

## 2019-09-10 DIAGNOSIS — R1010 Upper abdominal pain, unspecified: Secondary | ICD-10-CM | POA: Diagnosis present

## 2019-09-10 DIAGNOSIS — Z96612 Presence of left artificial shoulder joint: Secondary | ICD-10-CM | POA: Insufficient documentation

## 2019-09-10 LAB — COMPREHENSIVE METABOLIC PANEL
ALT: 37 U/L (ref 0–44)
AST: 29 U/L (ref 15–41)
Albumin: 4 g/dL (ref 3.5–5.0)
Alkaline Phosphatase: 38 U/L (ref 38–126)
Anion gap: 12 (ref 5–15)
BUN: 19 mg/dL (ref 8–23)
CO2: 23 mmol/L (ref 22–32)
Calcium: 10.7 mg/dL — ABNORMAL HIGH (ref 8.9–10.3)
Chloride: 100 mmol/L (ref 98–111)
Creatinine, Ser: 0.98 mg/dL (ref 0.44–1.00)
GFR calc Af Amer: >60 mL/min (ref >60)
GFR calc non Af Amer: 55 mL/min — ABNORMAL LOW (ref >60)
Glucose, Bld: 121 mg/dL — ABNORMAL HIGH (ref 70–99)
Potassium: 4.3 mmol/L (ref 3.5–5.1)
Sodium: 135 mmol/L (ref 135–145)
Total Bilirubin: 0.7 mg/dL (ref 0.3–1.2)
Total Protein: 7.1 g/dL (ref 6.5–8.1)

## 2019-09-10 LAB — CBC
HCT: 40.2 % (ref 36.0–46.0)
Hemoglobin: 13.4 g/dL (ref 12.0–15.0)
MCH: 31.4 pg (ref 26.0–34.0)
MCHC: 33.3 g/dL (ref 30.0–36.0)
MCV: 94.1 fL (ref 80.0–100.0)
Platelets: 202 10*3/uL (ref 150–400)
RBC: 4.27 MIL/uL (ref 3.87–5.11)
RDW: 11.9 % (ref 11.5–15.5)
WBC: 6.7 10*3/uL (ref 4.0–10.5)
nRBC: 0 % (ref 0.0–0.2)

## 2019-09-10 LAB — TYPE AND SCREEN
ABO/RH(D): A POS
Antibody Screen: NEGATIVE

## 2019-09-10 LAB — LIPASE, BLOOD: Lipase: 24 U/L (ref 11–51)

## 2019-09-10 LAB — POC OCCULT BLOOD, ED: Fecal Occult Bld: POSITIVE — AB

## 2019-09-10 MED ORDER — ONDANSETRON 8 MG PO TBDP: 8.0000 mg | ORAL_TABLET | Freq: Three times a day (TID) | ORAL | 0 refills | Status: DC | PRN

## 2019-09-10 MED ORDER — ONDANSETRON 8 MG PO TBDP
8.0000 mg | ORAL_TABLET | Freq: Once | ORAL | Status: AC
  Administered 2019-09-10: 8 mg via ORAL
  Filled 2019-09-10: qty 1

## 2019-09-10 NOTE — ED Triage Notes (Signed)
Per patient, for past several weeks she has had a "wave feeling" going through her lower abdomen. Patient states it is not painful, but uncomfortable. Patient says she consulted her GI who instructed her to stop medication. Patient states she had bowel movement today and had bright red blood. Patient denies use of anticoagulants.

## 2019-09-10 NOTE — Discharge Instructions (Addendum)
If you are taking celecoxib for pain, discontinue that medication.  You can take tylenol instead.  Take the zofran for nausea.  Follow up with your doctor and consider seeing a GI doctor for further evaluation of the blood in your stool.  Return to the ED for heavy bleeding, lightheadedness, fever or other concerning symptoms

## 2019-09-10 NOTE — ED Notes (Signed)
Last set of vitals prior to discharge BP 141/77 HR 82 RR 16 Sat 97RA.

## 2019-09-10 NOTE — ED Provider Notes (Signed)
[facility] DEPT Provider Note   CSN: [id number] Arrival date & time: 09/10/19  1004     History Chief Complaint  Patient presents with  . Abdominal Pain  . Blood In Stools    Brenda Stephens is a 79 y.o. female.  HPI   Pt feels like her stomach has been on a boat for several weeks.  She is having a wave feeling in her upper abdomen.  She also started to notice aching for several weeks.  It has gotten worse the last couple of days.  Today she noticed some blood in her stool.  It was bright red.  She has been nauseated but no vomiting.  No trouble urinating. She has an appointment but not until April.  When she noticed the blood in her stool today she came to the ED.  She is not taking aspirin but she cannot recall if she is taking celecoxib.  Pt had been having issues in February.  She had a CT scan then on Fen 26th that did not show any acute abnormalities.   Past Medical History:  Diagnosis Date  . Allergy    latex  . Anemia   . Anxiety   . Arthritis   . Cancer ([place]) 01/2017   right breast cancer  . Depression   . Dyslipidemia   . Eustachian tube dysfunction   . GERD (gastroesophageal reflux disease)   . Hx of cardiovascular stress test    Myoview 10/13:  apical thinning, EF 72%, no ischemia  . Hx of colonic polyps    Dr Watt Climes  . Hypertension   . Hypothyroidism   . Other abnormal glucose     Patient Active Problem List   Diagnosis Date Noted  . Unstable angina ([place]) 08/02/2019  . Rotator cuff arthropathy, left 01/12/2019  . Vertigo 02/04/2018  . Leg swelling 12/17/2017  . Acute bronchitis 08/04/2017  . Malignant neoplasm of upper-outer quadrant of right breast in female, estrogen receptor positive ([place]) 03/13/2017  . Acute frontal sinusitis 05/13/2013  . Chest pain of uncertain etiology [id number]  . Fasting hyperglycemia 06/13/2011  . HYPOTHYROIDISM 05/06/2010  . Hyperlipidemia 05/06/2010  . ARTHRITIS, SHOULDER 05/06/2010  .  FATIGUE, CHRONIC 05/06/2010  . Seasonal and perennial allergic rhinitis 03/14/2010  . ANEMIA-IRON DEFICIENCY 10/29/2009  . Essential hypertension 10/29/2009  . GERD 10/29/2009  . COLONIC POLYPS, HX OF 10/29/2009  . UMBILICAL HERNIA, HX OF [id number]    Past Surgical History:  Procedure Laterality Date  . ABDOMINAL HYSTERECTOMY     BSO for uterine fibroids  . ACHILLES TENDON SURGERY Right 10/07/2012   Procedure: RIGHT REPAIR RUPTURE TENDON PRIMARY OPEN/PERCUTANEOUS ;  Surgeon: Ninetta Lights, MD;  Location: [facility];  Service: Orthopedics;  Laterality: Right;  . APPENDECTOMY    . BREAST LUMPECTOMY WITH RADIOACTIVE SEED AND SENTINEL LYMPH NODE BIOPSY Right 03/16/2017   Procedure: RIGHT BREAST LUMPECTOMY WITH RADIOACTIVE SEED AND RIGHT AXILLARY SENTINEL  NODE BIOPSY ERAS PATHWAY;  Surgeon: [name], MD;  Location: [facility];  Service: General;  Laterality: Right;  PECTORAL BLOCK  . CATARACT EXTRACTION, BILATERAL    . CHOLECYSTECTOMY    . COLONOSCOPY W/ POLYPECTOMY     Dr Watt Climes  . EAR CYST EXCISION Right 10/07/2012   Procedure: EXCISION BONE CYST BENIGN TUMOR [name] ;  Surgeon: Ninetta Lights, MD;  Location: [facility];  Service: Orthopedics;  Laterality: Right;  . EXPLORATORY LAPAROTOMY    . LEFT  HEART CATH AND CORONARY ANGIOGRAPHY N/A 08/02/2019   Procedure: LEFT HEART CATH AND CORONARY ANGIOGRAPHY;  Surgeon: Martinique, Peter M, MD;  Location: [facility];  Service: Cardiovascular;  Laterality: N/A;  . REVISION TOTAL SHOULDER TO REVERSE TOTAL SHOULDER Left 01/12/2019   Procedure: REVISION TOTAL SHOULDER TO REVERSE TOTAL SHOULDER;  Surgeon: Hiram Gash, MD;  [facility];  Service: Orthopedics;  Laterality: Left;  . SINUS SURGERY WITH INSTATRAK    . TOTAL SHOULDER REPLACEMENT     Right shoulder  . 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 TUBAL LIGATION       OB History    Gravida  1   Para  1   Term        Preterm      AB      Living  1     SAB      TAB      Ectopic      Multiple      Live Births              Family History  Problem Relation Age of Onset  . Hiatal hernia Mother   . Heart attack Mother 44  . Heart disease Father 28       CAD @ autopsy  . Cancer Father        renal cancer  . Heart attack Brother 4       fatal  . Diabetes Neg Hx   . Stroke Neg Hx     Social History   Tobacco Use  . Smoking status: Former Smoker    Packs/day: 0.50    Years: 6.00    Pack years: 3.00    Types: Cigarettes    Quit date: 06/30/1962    Years since quitting: 57.2  . Smokeless tobacco: Never Used  . Tobacco comment: Quit age 75  Substance Use Topics  . Alcohol use: Yes    Alcohol/week: 3.0 standard drinks    Types: 3 Glasses of wine per week    Comment: wine  : < 3 glasses weekly  . Drug use: No    Home Medications Prior to Admission medications   Medication Sig Start Date End Date Taking? Authorizing Provider  aspirin EC 81 MG tablet Take 1 tablet (81 mg total) by mouth daily. 07/26/19  Yes [name], MD  atorvastatin (LIPITOR) 20 MG tablet TAKE 1 TABLET BY MOUTH AT BEDTIME Patient taking differently: Take 20 mg by mouth at bedtime.  05/19/13  Yes Hendricks Limes, MD  dicyclomine (BENTYL) 10 MG capsule Take 20 mg by mouth 4 (four) times daily -  before meals and at bedtime.    Yes [provider]  esomeprazole (NEXIUM) 20 MG capsule Take 20 mg by mouth at bedtime.   Yes [provider]  metoprolol succinate (TOPROL-XL) 50 MG 24 hr tablet Take 1 tablet (50 mg total) by mouth daily. 07/19/12  Yes Hendricks Limes, MD  nitroGLYCERIN (NITROSTAT) 0.4 MG SL tablet Place 1 tablet (0.4 mg total) under the tongue every 5 (five) minutes as needed for chest pain. 07/26/19 10/24/19 Yes Hochrein, Jeneen Rinks, MD  spironolactone-hydrochlorothiazide (ALDACTAZIDE) 25-25 MG per tablet Take 1 tablet by mouth daily. 07/19/12  Yes Hendricks Limes, MD   SYNTHROID 50 MCG tablet PT NEEDS COMPLETE PHYSICAL.  TAKE 1 TABLET (50MCG TOTAL) BY MOUTH DAILY. Patient taking differently: Take 50 mcg by mouth daily.  04/07/14  Yes Hendricks Limes, MD  tamoxifen (NOLVADEX) 20 MG tablet TAKE 1 TABLET BY MOUTH EVERY DAY Patient taking differently: Take 20 mg by mouth daily.  02/22/19  Yes [name], [name], MD  tamsulosin (FLOMAX) 0.4 MG CAPS capsule Take 0.4 mg by mouth daily. 04/15/19  Yes [provider]  venlafaxine XR (EFFEXOR-XR) 150 MG 24 hr capsule TAKE 1 CAPSULE (150 MG TOTAL) BY MOUTH DAILY. Patient taking differently: Take 150 mg by mouth daily.  12/14/12  Yes Hendricks Limes, MD  verapamil (CALAN-SR) 240 MG CR tablet TAKE 1 TABLET (240 MG TOTAL) BY MOUTH AT BEDTIME. Patient taking differently: Take 240 mg by mouth at bedtime.  02/27/14  Yes Hendricks Limes, MD  zolpidem (AMBIEN) 5 MG tablet Take 5 mg by mouth at bedtime.  04/29/13  Yes [provider]  acetaminophen (TYLENOL) 500 MG tablet Take 1,000 mg by mouth 2 (two) times daily as needed for moderate pain.    [provider]  diclofenac sodium (VOLTAREN) 1 % GEL Apply 1 application topically 2 (two) times a day.    [provider]  donepezil (ARICEPT) 5 MG tablet Take 5 mg by mouth at bedtime. 08/22/19   [provider]  fluticasone (FLONASE) 50 MCG/ACT nasal spray Place 2 sprays into both nostrils daily. 08/29/19   [provider]  Homeopathic Products (ARNICARE EX) Apply 1 application topically daily as needed (pain).    [provider]  ondansetron (ZOFRAN ODT) 8 MG disintegrating tablet Take 1 tablet (8 mg total) by mouth every 8 (eight) hours as needed for nausea or vomiting. 09/10/19   Dorie Rank, MD  ondansetron (ZOFRAN) 4 MG tablet Take 4 mg by mouth every 8 (eight) hours as needed. 08/17/19   [provider]  Phenylephrine HCl (SUDAFED PE CHILDRENS) 2.5 MG/5ML SOLN Take 5 mLs by mouth daily.    [provider]   PRESCRIPTION MEDICATION Apply 1 application topically 2 (two) times daily. Triamcinolone cream compounded with SSD cream 1:1    [provider]  sennosides-docusate sodium (SENOKOT-S) 8.6-50 MG tablet Take 2 tablets by mouth daily as needed for constipation.    [provider]  tolterodine (DETROL LA) 2 MG 24 hr capsule Take 2 mg by mouth daily.    [provider]    Allergies    Latex, Aspirin, Oxycodone-acetaminophen, Sulfa antibiotics, and Sulfamethoxazole-trimethoprim  Review of Systems   Review of Systems  All other systems reviewed and are negative.   Physical Exam Updated Vital Signs BP (!) 165/75   Pulse 68   Temp 97.7 F (36.5 C) (Oral)   Resp 18   Ht 1.6 m (5\' 3" )   Wt 84.3 kg   SpO2 99%   BMI 32.91 kg/m   Physical Exam Vitals and nursing note reviewed.  Constitutional:      General: She is not in acute distress.    Appearance: She is well-developed.  HENT:     Head: Normocephalic and atraumatic.     Right Ear: External ear normal.     Left Ear: External ear normal.  Eyes:     General: No scleral icterus.       Right eye: No discharge.        Left eye: No discharge.     Conjunctiva/sclera: Conjunctivae normal.  Neck:     Trachea: No tracheal deviation.  Cardiovascular:     Rate and Rhythm: Normal rate and regular rhythm.  Pulmonary:     Effort: Pulmonary effort is normal. No respiratory  distress.     Breath sounds: Normal breath sounds. No stridor. No wheezing or rales.  Abdominal:     General: Bowel sounds are normal. There is no distension.     Palpations: Abdomen is soft.     Tenderness: There is no abdominal tenderness. There is no guarding or rebound.  Genitourinary:    Comments: External hemorrhoid, no mass appreciated, trace blood noted on DRE Musculoskeletal:        General: No tenderness.     Cervical back: Neck supple.  Skin:    General: Skin is warm and dry.     Findings: No rash.  Neurological:     Mental  Status: She is alert.     Cranial Nerves: No cranial nerve deficit (no facial droop, extraocular movements intact, no slurred speech).     Sensory: No sensory deficit.     Motor: No abnormal muscle tone or seizure activity.     Coordination: Coordination normal.     ED Results / Procedures / Treatments   Labs (all labs ordered are listed, but only abnormal results are displayed) Labs Reviewed  COMPREHENSIVE METABOLIC PANEL - Abnormal; Notable for the following components:      Result Value   Glucose, Bld 121 (*)    Calcium 10.7 (*)    GFR calc non Af Amer 55 (*)    All other components within normal limits  POC OCCULT BLOOD, ED - Abnormal; Notable for the following components:   Fecal Occult Bld POSITIVE (*)    All other components within normal limits  CBC  LIPASE, BLOOD  TYPE AND SCREEN    EKG None  Radiology No results found.  Procedures Procedures (including critical care time)  Medications Ordered in ED Medications  ondansetron (ZOFRAN-ODT) disintegrating tablet 8 mg (8 mg Oral Given 09/10/19 1244)    ED Course  I have reviewed the triage vital signs and the nursing notes.  Pertinent labs & imaging results that were available during my care of the patient were reviewed by me and considered in my medical decision making (see chart for details).  Clinical Course as of Sep 10 1346  Sat Sep 10, 2019  1332 Labs reviewed.  No significant abnormality noted.  Hemoccult positive stool   [JK]    Clinical Course User Index [JK] Dorie Rank, MD   MDM Rules/Calculators/A&P                      Patient has been having abdominal discomfort for several weeks now.  Records indicate that she had a CT scan of her abdomen pelvis the end of February.  No acute findings were noted there.  Patient's laboratory tests are unremarkable.  She is not having any fevers, vomiting or diarrhea.  Have a low suspicion for obstruction diverticulitis colitis no known relieving allergies.   Patient has noticed some blood in her stool today.  She does have external hemorrhoids.  Do not see any active bleeding but it is possible she may be having hemorrhoidal bleeding.  Certainly cannot exclude other lower GI sources of blood loss however she has normal blood count.  There is only trace in the blood noted on rectal exam she has not had persistent episodes.  I think she is safe for outpatient follow-up.  Warning signs precautions discussed. Final Clinical Impression(s) / ED Diagnoses Final diagnoses:  Nausea  Blood in stool    Rx / DC Orders ED Discharge Orders  Ordered    ondansetron (ZOFRAN ODT) 8 MG disintegrating tablet  Every 8 hours PRN     09/10/19 1346           Dorie Rank, MD 09/10/19 1349

## 2019-09-13 ENCOUNTER — Other Ambulatory Visit: Payer: Self-pay

## 2019-09-13 ENCOUNTER — Ambulatory Visit: Payer: MEDICARE | Admitting: Cardiology

## 2019-09-13 DIAGNOSIS — R0602 Shortness of breath: Secondary | ICD-10-CM

## 2019-09-13 NOTE — Progress Notes (Signed)
Chest xray per Dr. Percival Spanish

## 2019-09-23 ENCOUNTER — Ambulatory Visit: Payer: MEDICARE | Admitting: Podiatry

## 2019-10-12 ENCOUNTER — Encounter: Payer: Self-pay | Admitting: Podiatry

## 2019-10-12 ENCOUNTER — Ambulatory Visit (INDEPENDENT_AMBULATORY_CARE_PROVIDER_SITE_OTHER): Payer: MEDICARE | Admitting: Podiatry

## 2019-10-12 ENCOUNTER — Encounter: Payer: Self-pay | Admitting: Internal Medicine

## 2019-10-12 ENCOUNTER — Other Ambulatory Visit: Payer: Self-pay

## 2019-10-12 ENCOUNTER — Ambulatory Visit (INDEPENDENT_AMBULATORY_CARE_PROVIDER_SITE_OTHER): Payer: MEDICARE | Admitting: Internal Medicine

## 2019-10-12 VITALS — BP 142/80 | HR 74 | Temp 97.9°F | Ht 63.0 in | Wt 170.0 lb

## 2019-10-12 VITALS — Temp 97.8°F

## 2019-10-12 DIAGNOSIS — R600 Localized edema: Secondary | ICD-10-CM

## 2019-10-12 DIAGNOSIS — R0600 Dyspnea, unspecified: Secondary | ICD-10-CM | POA: Diagnosis not present

## 2019-10-12 DIAGNOSIS — R0609 Other forms of dyspnea: Secondary | ICD-10-CM

## 2019-10-12 DIAGNOSIS — M7989 Other specified soft tissue disorders: Secondary | ICD-10-CM

## 2019-10-12 DIAGNOSIS — M76822 Posterior tibial tendinitis, left leg: Secondary | ICD-10-CM

## 2019-10-12 NOTE — Progress Notes (Signed)
Subjective:    Patient ID: Brenda Stephens, female    DOB: [birth year], 79 y.o.   MRN: [id number]  HPI F former smoker followed for allergic rhinitis, Bronchitis, complicated by HBP, GERD, Anemia, R breast cancer Walk Test 10/12/19- desat to 89% with max HR 95. Mostly sats around 94-96.  ------------------------------------------------------------------------------------  08/04/2018- 76 yoF former smoker followed for allergic rhinitis, acute bronchitis,  complicated by HBP, GERD, Anemia, R breast cancer -----nasal dryness and nose feels congested though nothing comes out when blowing  nose - some drainage down back of throat  Right maxillary/facial pain over at least the last week.  Using Flonase daily, Children's Sudafed.  No fever or purulent discharge.  10/12/19- 12 yoF former smoker followed for allergic rhinitis, acute bronchitis,  complicated by HBP, GERD, Anemia, R breast cancer -----f/u Allergic rhimitis.  Cardiac cath wnl 08/02/19   EF 55-65% Husband here Reports 2-3 months awareness of Doe, esp climbing stairs- may stop half way to rest. In the past she was evaluated for chest pain with similar activity. Ok at rest. Denies cough or wheeze, chest pain or palpitation. Eval at [facility] for bradycardia in 40's. Has seen Dr [facility].  Feet swell- chronic. No calf pain.  GI eval for food hang-up.  Walk Test 10/12/19- desat to 89% with max HR 95. Mostly sats around 94-96. CXR- recent outside our system. Husband says it was "ok, clear".  ROS-see HPI   + = positive Constitutional:   No-   weight loss, night sweats, fevers, chills, fatigue, lassitude. HEENT:   + headaches, difficulty swallowing, tooth/dental problems, sore throat,       No-  sneezing, itching, ear ache, nasal congestion, +post nasal drip,  CV:  No-   chest pain, orthopnea, PND, swelling in lower extremities, anasarca,  dizziness, palpitations Resp: + shortness of breath with exertion or at rest.    productive cough,  No non-productive cough,  No- coughing up of blood.              No-   change in color of mucus.  No- wheezing.   Skin: No-   rash or lesions. GI:  No-   heartburn, indigestion, abdominal pain, nausea, vomiting, GU:  MS:  No-   joint pain or swelling.  Neuro-     nothing unusual Psych:  No- change in mood or affect. No depression or anxiety.  No memory loss.    Objective:  OBJ- Physical Exam General- Alert, Oriented, Affect-appropriate, Distress- none acute, + overweight Skin- rash-none, lesions- none, excoriation- none Lymphadenopathy- none Head- atraumatic            Eyes- Gross vision intact, PERRLA, conjunctivae and secretions clear            Ears- Hearing ok,             Nose-, no-Septal dev,  polyps, erosion, perforation             Throat- Mallampati III , mucosa clear , drainage- none, tonsils- atrophic Neck- flexible , trachea midline, no stridor , thyroid nl, carotid no bruit Chest - symmetrical excursion , unlabored           Heart/CV- RRR , no murmur , no gallop  , no rub, nl s1 s2                           - JVD- none , edema+1, stasis changes- none, varices- none  Lung- clear to P&A, wheeze- none, cough none, dullness-none, rub- none           Chest wall-  Abd- Br/ Gen/ Rectal- Not done, not indicated Extrem- cyanosis- none, clubbing, none, atrophy- none, strength- nl.  Neuro- grossly intact to observation Assessment & Plan:

## 2019-10-12 NOTE — Assessment & Plan Note (Signed)
Addressed by PCP and cardiology

## 2019-10-12 NOTE — Patient Instructions (Signed)
Order- schedule PFT   Dx DOE  Order Walk Test on room air    Dx DOE  Sample Trelegy 100     Inhale 1 puff, then rinse mouth, once daily  Please call as needed  Try to walk more to build your endurance

## 2019-10-12 NOTE — Assessment & Plan Note (Signed)
Cardiac cath and CXR both reported without problem.  This may just be deconditioning and age. Plan- Sample trial Trelegy 100 inhaler, schedule PFT, walk more for endurance.

## 2019-10-14 NOTE — Progress Notes (Signed)
Subjective:   Patient ID: Brenda Stephens, female   DOB: 79 y.o.   MRN: [id number]   HPI Patient states her ankle has started to hurt her again and the swelling continues to be persistent in her foot and around her ankle and it is frustrating.  States the pain is not as intense as it was but it is present    ROS      Objective:  Physical Exam  Neurovascular status intact with patient found to have continued edema in the ankle region bilateral and inflammation of the posterior tibial tendon as it comes underneath the malleolus left.  There is no indications of muscle strength loss and it appears to be for the most part inflammatory in its nature     Assessment:  Significant midfoot and ankle edema left with negative Homans' sign #1 and #2 what appears to be again acute posterior tibial tendinitis left     Plan:  H&P reviewed condition discussed MRI and she does not want this currently and will get a hold off on that and today I did do a sterile prep and I injected the posterior tib and a more proximal area 3 mg dexamethasone 5 mg Xylocaine discussed the edema continue compression elevation and I did discuss possibility for vein center to try to ascertain better if we can help her and she denies wanting this currently

## 2019-10-20 ENCOUNTER — Ambulatory Visit: Payer: MEDICARE | Admitting: Podiatry

## 2019-12-02 ENCOUNTER — Telehealth: Payer: Self-pay | Admitting: *Deleted

## 2019-12-02 MED ORDER — NYSTATIN-TRIAMCINOLONE 100000-0.1 UNIT/GM-% EX OINT: 1.0000 "application " | TOPICAL_OINTMENT | Freq: Two times a day (BID) | CUTANEOUS | 0 refills | Status: DC | PRN

## 2019-12-02 NOTE — Telephone Encounter (Signed)
Patient called requesting refill on nystatin-triamcinolone ointment last prescribed in 2020 due rash under breast. She is using gold bold but it is not helping. Patient said ointment helped much better. Annual exam scheduled on 02/16/20.

## 2019-12-02 NOTE — Telephone Encounter (Signed)
Agree with represcription.

## 2019-12-02 NOTE — Telephone Encounter (Signed)
Left on voicemail Rx sent 

## 2020-01-16 ENCOUNTER — Ambulatory Visit (INDEPENDENT_AMBULATORY_CARE_PROVIDER_SITE_OTHER): Payer: MEDICARE | Admitting: Obstetrics & Gynecology

## 2020-01-16 ENCOUNTER — Encounter: Payer: Self-pay | Admitting: Obstetrics & Gynecology

## 2020-01-16 ENCOUNTER — Other Ambulatory Visit: Payer: Self-pay | Admitting: Obstetrics & Gynecology

## 2020-01-16 ENCOUNTER — Other Ambulatory Visit: Payer: Self-pay

## 2020-01-16 VITALS — BP 134/80

## 2020-01-16 DIAGNOSIS — B372 Candidiasis of skin and nail: Secondary | ICD-10-CM

## 2020-01-16 MED ORDER — FLUCONAZOLE 150 MG PO TABS: 150.0000 mg | ORAL_TABLET | Freq: Every day | ORAL | 3 refills | Status: AC

## 2020-01-16 MED ORDER — NYSTATIN 100000 UNIT/GM EX POWD: 1.0000 "application " | Freq: Three times a day (TID) | CUTANEOUS | 3 refills | Status: DC

## 2020-01-16 NOTE — Progress Notes (Signed)
    Brenda Stephens [birth year] [id number]        79 y.o.  G1P1L1  RP: Recurrent redness and itching under both breasts  HPI: Recurrent redness and itching under both breasts.  Mycolog cream not helpful currently.  As had success with nystatin powder previously, but ran out of medication.  Wearing cotton bras.  No breast lump or nipple discharge.   OB History  Gravida Para Term Preterm AB Living  1 1       1   SAB TAB Ectopic Multiple Live Births               # Outcome Date GA Lbr Len/2nd Weight Sex Delivery Anes PTL Lv  1 Para             Past medical history,surgical history, problem list, medications, allergies, family history and social history were all reviewed and documented in the EPIC chart.   Directed ROS with pertinent positives and negatives documented in the history of present illness/assessment and plan.  Exam:  Vitals:   01/16/20 1516  BP: 134/80   General appearance:  Normal  Breast exam: Erythema under both breasts.  Some skin desquamation at the line of contact between the breast and chest bilaterally.   Assessment/Plan:  79 y.o. G1P1   1. Yeast infection of the skin Recurrent yeast infection of the skin under both breasts.  Will treat with nystatin powder.  Usage reviewed and prescription sent to pharmacy.  We will also add fluconazole 1 tablet p.o. daily for 3 days with refills.  Usage reviewed and prescription sent to pharmacy.  May use hydrocortisone 1% cream if erythema is not improving with the antifungal treatment.  Other orders - fluconazole (DIFLUCAN) 150 MG tablet; Take 1 tablet (150 mg total) by mouth daily for 3 days. - nystatin (MYCOSTATIN/NYSTOP) powder; Apply 1 application topically 3 (three) times daily.  Princess Bruins MD, 3:43 PM 01/16/2020

## 2020-01-17 ENCOUNTER — Telehealth: Payer: Self-pay | Admitting: *Deleted

## 2020-01-17 MED ORDER — TRIAMCINOLONE ACETONIDE 0.1 % EX CREA: 1.0000 "application " | TOPICAL_CREAM | Freq: Two times a day (BID) | CUTANEOUS | 3 refills | Status: DC

## 2020-01-17 MED ORDER — NYSTATIN 100000 UNIT/GM EX CREA: 1.0000 "application " | TOPICAL_CREAM | Freq: Two times a day (BID) | CUTANEOUS | 3 refills | Status: DC

## 2020-01-17 NOTE — Telephone Encounter (Signed)
Pharmacy called stating nystatin-triamcinolone ointment is not covered with insurance, however is sent as separate Rx's it is covered. Nystatin cream and triamcinolone cream 0.1% sent.

## 2020-02-13 ENCOUNTER — Ambulatory Visit: Payer: MEDICARE | Admitting: Internal Medicine

## 2020-02-16 ENCOUNTER — Encounter: Payer: MEDICARE | Admitting: Obstetrics & Gynecology

## 2020-02-24 ENCOUNTER — Other Ambulatory Visit: Payer: Self-pay | Admitting: *Deleted

## 2020-02-24 ENCOUNTER — Other Ambulatory Visit: Payer: Self-pay | Admitting: Oncology

## 2020-02-24 MED ORDER — TAMOXIFEN CITRATE 20 MG PO TABS: 20.0000 mg | ORAL_TABLET | Freq: Every day | ORAL | Status: DC

## 2020-02-29 ENCOUNTER — Other Ambulatory Visit: Payer: Self-pay | Admitting: *Deleted

## 2020-02-29 MED ORDER — TAMOXIFEN CITRATE 20 MG PO TABS: 20.0000 mg | ORAL_TABLET | Freq: Every day | ORAL | Status: DC

## 2020-02-29 MED ORDER — TAMOXIFEN CITRATE 20 MG PO TABS: 20.0000 mg | ORAL_TABLET | Freq: Every day | ORAL | 0 refills | Status: DC

## 2020-03-12 ENCOUNTER — Inpatient Hospital Stay: Payer: MEDICARE | Admitting: Oncology

## 2020-03-12 ENCOUNTER — Inpatient Hospital Stay: Payer: MEDICARE

## 2020-03-30 ENCOUNTER — Inpatient Hospital Stay: Payer: MEDICARE | Admitting: Adult Health

## 2020-03-30 ENCOUNTER — Telehealth: Payer: Self-pay

## 2020-03-30 ENCOUNTER — Inpatient Hospital Stay: Payer: MEDICARE

## 2020-03-30 NOTE — Progress Notes (Deleted)
Brenda Stephens  Telephone:([phone]) [phone] Fax:(336) [phone]     ID: Brenda Stephens DOB: 03-25-1941  MR#: [id number]  LTR#:[id number]  Patient Care Team: Arlyss Repress, MD as PCP - General (Internal Medicine) [name], MD as PCP - Cardiology (Cardiology) [name], [name], MD as Consulting Physician (Oncology) [name], MD as Consulting Physician (General Surgery) [name], MD as Consulting Physician (Gastroenterology) OTHER MD:   CHIEF COMPLAINT: Estrogen receptor positive breast cancer  CURRENT TREATMENT: Tamoxifen   INTERVAL HISTORY: Brenda Stephens returns today for follow-up and treatment of her estrogen receptor positive breast cancer.   Brenda Stephens continues on tamoxifen with good tolerance.   Her most recent mammogram that we have on record was completed on 03/03/2019 that showed no evidence of malignancy and breast density category B   REVIEW OF SYSTEMS: Brenda Stephens r    HISTORY OF CURRENT ILLNESS: From the original intake note:  Brenda Stephens had routine bilateral screening mammography at [facility] 01/28/2017. An irregular lesion in the right breast upper outer quadrant was detected and she was recalled for right diagnostic mammography and ultrasonography 02/05/2017. This found the breast density to be category B. In the right breast upper quadrant there was an irregular mass which by ultrasound measured 0.8 cm. It was located at the 11:00 radiant posteriorly. The right axilla was sonographically benign.  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  The patient's subsequent history is as detailed below.   PAST MEDICAL HISTORY: Past Medical History:  Diagnosis Date  . Allergy    latex    . Anemia   . Anxiety   . Arthritis   . Cancer ([place]) 01/2017   right breast cancer  . Depression   . Dyslipidemia   . Eustachian tube dysfunction   . GERD (gastroesophageal reflux disease)   . Hx of cardiovascular stress test    Myoview 10/13:  apical thinning, EF 72%, no ischemia  . Hx of colonic polyps    Dr Watt Climes  . Hypertension   . Hypothyroidism   . Other abnormal glucose     PAST SURGICAL HISTORY: Past Surgical History:  Procedure Laterality Date  . ABDOMINAL HYSTERECTOMY     BSO for uterine fibroids  . ACHILLES TENDON SURGERY Right 10/07/2012   Procedure: RIGHT REPAIR RUPTURE TENDON PRIMARY OPEN/PERCUTANEOUS ;  Surgeon: Ninetta Lights, MD;  Location: [place];  Service: Orthopedics;  Laterality: Right;  . APPENDECTOMY    . BREAST LUMPECTOMY WITH RADIOACTIVE SEED AND SENTINEL LYMPH NODE BIOPSY Right 03/16/2017   Procedure: RIGHT BREAST LUMPECTOMY WITH RADIOACTIVE SEED AND RIGHT AXILLARY SENTINEL  NODE BIOPSY ERAS PATHWAY;  Surgeon: [name], MD;  Location: [place];  Service: General;  Laterality: Right;  PECTORAL BLOCK  . CATARACT EXTRACTION, BILATERAL    . CHOLECYSTECTOMY    . COLONOSCOPY W/ POLYPECTOMY     Dr Watt Climes  . EAR CYST EXCISION Right 10/07/2012   Procedure: EXCISION BONE CYST BENIGN TUMOR [name] ;  Surgeon: Ninetta Lights, MD;  Location: [place];  Service: Orthopedics;  Laterality: Right;  . EXPLORATORY LAPAROTOMY    . LEFT HEART CATH AND CORONARY ANGIOGRAPHY N/A 08/02/2019   Procedure: LEFT HEART CATH AND CORONARY ANGIOGRAPHY;  Surgeon: Martinique, Peter  M, MD;  Location: [place] [facility];  Service: Cardiovascular;  Laterality: N/A;  . REVISION TOTAL SHOULDER TO REVERSE TOTAL SHOULDER Left 01/12/2019   Procedure: REVISION TOTAL SHOULDER TO REVERSE TOTAL SHOULDER;  Surgeon: Hiram Gash, MD;  Location: [facility];  Service: Orthopedics;  Laterality: Left;  . SINUS SURGERY WITH INSTATRAK    . TOTAL  SHOULDER REPLACEMENT     Right shoulder  . 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 TUBAL LIGATION      FAMILY HISTORY Family History  Problem Relation Age of Onset  . Hiatal hernia Mother   . Heart attack Mother 31  . Heart disease Father 56       CAD @ autopsy  . Cancer Father        renal cancer  . Heart attack Brother 63       fatal  . Diabetes Neg Hx   . Stroke Neg Hx   The patient's father died from kidney cancer at age 80. The patient's mother died from a heart attack at age 23. The patient had one brother, who died from a heart attack. She had no sisters. There is no history of breast or ovarian cancer in the family.   GYNECOLOGIC HISTORY:  No LMP recorded. Patient has had a hysterectomy. Menarche: age 6 First live birth: age 43 [place] P48.  Hysterectomy? Yes BSO? Yes HR: yes, for between 5 and 10 years.   SOCIAL HISTORY:  [place] worked as an Web designer for a Scientist, research (medical). Her husband was Mudlogger of [place] for railroad company. He is retired but currently overseas the [employer] property as a Freight forwarder. Their daughter [name] is an attorney but currently works as a Education officer, museum in [place]. The patient has no grandchildren. She is not a church attender    ADVANCED DIRECTIVES: Her husband is her healthcare power of attorney per [employer] MAINTENANCE: Social History   Tobacco Use  . Smoking status: Former Smoker    Packs/day: 0.50    Years: 6.00    Pack years: 3.00    Types: Cigarettes    Quit date: 06/30/1962    Years since quitting: 57.7  . Smokeless tobacco: Never Used  . Tobacco comment: Quit age 65  Vaping Use  . Vaping Use: Never used  Substance Use Topics  . Alcohol use: Yes    Alcohol/week: 3.0 standard drinks    Types: 3 Glasses of wine per week    Comment: wine  : < 3 glasses weekly  . Drug use: No     Colonoscopy:May got  PAP: Status post hysterectomy  Bone density: Due/[facility]   Allergies    Allergen Reactions  . Latex Rash  . Aspirin Other (See Comments)    GI bleeding  . Oxycodone-Acetaminophen Nausea Only  . Sulfa Antibiotics Nausea Only  . Sulfamethoxazole-Trimethoprim Nausea Only    Current Outpatient Medications  Medication Sig Dispense Refill  . acetaminophen (TYLENOL) 500 MG tablet Take 1,000 mg by mouth 2 (two) times daily as needed for moderate pain.    Marland Kitchen atorvastatin (LIPITOR) 20 MG tablet TAKE 1 TABLET BY MOUTH AT BEDTIME (Patient taking differently: Take 20 mg by mouth at bedtime. ) 90 tablet 1  . diclofenac sodium (VOLTAREN) 1 % GEL Apply 1 application topically 2 (two) times a day.    . donepezil (ARICEPT) 5 MG tablet Take 5 mg by mouth at bedtime.    Marland Kitchen esomeprazole ([place])  20 MG capsule Take 20 mg by mouth at bedtime.    . fluticasone (FLONASE) 50 MCG/ACT nasal spray Place 2 sprays into both nostrils daily as needed for allergies or rhinitis.     . metoprolol succinate (TOPROL-XL) 50 MG 24 hr tablet Take 1 tablet (50 mg total) by mouth daily. 90 tablet 3  . nitroGLYCERIN (NITROSTAT) 0.4 MG SL tablet Place 1 tablet (0.4 mg total) under the tongue every 5 (five) minutes as needed for chest pain. 25 tablet 3  . nystatin (MYCOSTATIN/NYSTOP) powder Apply 1 application topically 3 (three) times daily. 15 g 3  . nystatin cream (MYCOSTATIN) Apply 1 application topically 2 (two) times daily. 30 g 3  . ondansetron (ZOFRAN ODT) 8 MG disintegrating tablet Take 1 tablet (8 mg total) by mouth every 8 (eight) hours as needed for nausea or vomiting. 12 tablet 0  . ondansetron (ZOFRAN) 4 MG tablet Take 4 mg by mouth every 8 (eight) hours as needed for nausea or vomiting.     Marland Kitchen Phenylephrine HCl (SUDAFED PE CHILDRENS) 2.5 MG/5ML SOLN Take 5 mLs by mouth daily as needed (congestion).     . sennosides-docusate sodium (SENOKOT-S) 8.6-50 MG tablet Take 2 tablets by mouth daily as needed for constipation.    Marland Kitchen spironolactone-hydrochlorothiazide (ALDACTAZIDE) 25-25 MG per tablet Take  1 tablet by mouth daily. 90 tablet 3  . SYNTHROID 50 MCG tablet PT NEEDS COMPLETE PHYSICAL.  TAKE 1 TABLET (50MCG TOTAL) BY MOUTH DAILY. (Patient taking differently: Take 50 mcg by mouth daily. ) 30 tablet 0  . tamoxifen (NOLVADEX) 20 MG tablet Take 1 tablet (20 mg total) by mouth daily. 90 tablet 0  . tamsulosin (FLOMAX) 0.4 MG CAPS capsule Take 0.4 mg by mouth daily.    Marland Kitchen triamcinolone cream (KENALOG) 0.1 % Apply 1 application topically 2 (two) times daily. 30 g 3  . venlafaxine XR (EFFEXOR-XR) 150 MG 24 hr capsule TAKE 1 CAPSULE (150 MG TOTAL) BY MOUTH DAILY. (Patient taking differently: Take 150 mg by mouth daily. ) 90 capsule 2  . verapamil (CALAN-SR) 240 MG CR tablet TAKE 1 TABLET (240 MG TOTAL) BY MOUTH AT BEDTIME. (Patient taking differently: Take 240 mg by mouth at bedtime. ) 30 tablet 0  . zolpidem (AMBIEN) 5 MG tablet Take 5 mg by mouth at bedtime.      No current facility-administered medications for this visit.   ppears well  OBJECTIVE: Older white woman in no acute distress  There were no vitals filed for this visit.   There is no height or weight on file to calculate BMI.   Wt Readings from Last 3 Encounters:  10/12/19 170 lb (77.1 kg)  09/10/19 185 lb 12.8 oz (84.3 kg)  08/24/19 185 lb 12.8 oz (84.3 kg)      ECOG FS:1 - Symptomatic but completely ambulatory  Sclerae unicteric, EOMs intact Wearing a mask No cervical or supraclavicular adenopathy Lungs no rales or rhonchi Heart regular rate and rhythm Abd soft, nontender, positive bowel sounds MSK no focal spinal tenderness, left upper extremity immobilized Neuro: nonfocal, well oriented, appropriate affect Breasts: Deferred  LAB RESULTS:  CMP     Component Value Date/Time   NA 135 09/10/2019 1029   NA 138 07/26/2019 1548   NA 132 (L) 03/13/2017 1252   K 4.3 09/10/2019 1029   K 4.3 03/13/2017 1252   CL 100 09/10/2019 1029   CO2 23 09/10/2019 1029   CO2 26 03/13/2017 1252   GLUCOSE 121 (H) 09/10/2019 1029  GLUCOSE 107 03/13/2017 1252   BUN 19 09/10/2019 1029   BUN 15 07/26/2019 1548   BUN 14.8 03/13/2017 1252   CREATININE 0.98 09/10/2019 1029   CREATININE 0.8 03/13/2017 1252   CALCIUM 10.7 (H) 09/10/2019 1029   CALCIUM 10.0 03/13/2017 1252   PROT 7.1 09/10/2019 1029   PROT 7.2 03/13/2017 1252   ALBUMIN 4.0 09/10/2019 1029   ALBUMIN 4.0 03/13/2017 1252   AST 29 09/10/2019 1029   AST 18 03/13/2017 1252   ALT 37 09/10/2019 1029   ALT 18 03/13/2017 1252   ALKPHOS 38 09/10/2019 1029   ALKPHOS 71 03/13/2017 1252   BILITOT 0.7 09/10/2019 1029   BILITOT 0.54 03/13/2017 1252   GFRNONAA 55 (L) 09/10/2019 1029   GFRAA >60 09/10/2019 1029    No results found for: TOTALPROTELP, ALBUMINELP, A1GS, A2GS, BETS, BETA2SER, GAMS, MSPIKE, SPEI  No results found for: Nils Pyle, [facility]  Lab Results  Component Value Date   WBC 6.7 09/10/2019   NEUTROABS 4.1 02/03/2019   HGB 13.4 09/10/2019   HCT 40.2 09/10/2019   MCV 94.1 09/10/2019   PLT 202 09/10/2019      Chemistry      Component Value Date/Time   NA 135 09/10/2019 1029   NA 138 07/26/2019 1548   NA 132 (L) 03/13/2017 1252   K 4.3 09/10/2019 1029   K 4.3 03/13/2017 1252   CL 100 09/10/2019 1029   CO2 23 09/10/2019 1029   CO2 26 03/13/2017 1252   BUN 19 09/10/2019 1029   BUN 15 07/26/2019 1548   BUN 14.8 03/13/2017 1252   CREATININE 0.98 09/10/2019 1029   CREATININE 0.8 03/13/2017 1252      Component Value Date/Time   CALCIUM 10.7 (H) 09/10/2019 1029   CALCIUM 10.0 03/13/2017 1252   ALKPHOS 38 09/10/2019 1029   ALKPHOS 71 03/13/2017 1252   AST 29 09/10/2019 1029   AST 18 03/13/2017 1252   ALT 37 09/10/2019 1029   ALT 18 03/13/2017 1252   BILITOT 0.7 09/10/2019 1029   BILITOT 0.54 03/13/2017 1252       No results found for: LABCA2  No components found for: [id number]  No results for input(s): INR in the last 168 hours.  No results found for: LABCA2  No results found for: NFA213  No results  found for: YQM578  No results found for: ION629  No results found for: CA2729  No components found for: HGQUANT  No results found for: CEA1 / No results found for: CEA1   No results found for: AFPTUMOR  No results found for: CHROMOGRNA  No results found for: PSA1  No visits with results within 3 Day(s) from this visit.  Latest known visit with results is:  Admission on 09/10/2019, Discharged on 09/10/2019  Component Date Value Ref Range Status  . Sodium 09/10/2019 135  135 - 145 mmol/L Final  . Potassium 09/10/2019 4.3  3.5 - 5.1 mmol/L Final  . Chloride 09/10/2019 100  98 - 111 mmol/L Final  . CO2 09/10/2019 23  22 - 32 mmol/L Final  . Glucose, Bld 09/10/2019 121* 70 - 99 mg/dL Final   Glucose reference range applies only to samples taken after fasting for at least 8 hours.  . BUN 09/10/2019 19  8 - 23 mg/dL Final  . Creatinine, Ser 09/10/2019 0.98  0.44 - 1.00 mg/dL Final  . Calcium 09/10/2019 10.7* 8.9 - 10.3 mg/dL Final  . Total Protein 09/10/2019 7.1  6.5 - 8.1 g/dL Final  .  Albumin 09/10/2019 4.0  3.5 - 5.0 g/dL Final  . AST 09/10/2019 29  15 - 41 U/L Final  . ALT 09/10/2019 37  0 - 44 U/L Final  . Alkaline Phosphatase 09/10/2019 38  38 - 126 U/L Final  . Total Bilirubin 09/10/2019 0.7  0.3 - 1.2 mg/dL Final  . GFR calc non Af Amer 09/10/2019 55* >60 mL/min Final  . GFR calc Af Amer 09/10/2019 >60  >60 mL/min Final  . Anion gap 09/10/2019 12  5 - 15 Final   Performed at [facility], [place] [address], [place], [place] [address]  . WBC 09/10/2019 6.7  4.0 - 10.5 K/uL Final  . RBC 09/10/2019 4.27  3.87 - 5.11 MIL/uL Final  . Hemoglobin 09/10/2019 13.4  12.0 - 15.0 g/dL Final  . HCT 09/10/2019 40.2  36 - 46 % Final  . MCV 09/10/2019 94.1  80.0 - 100.0 fL Final  . MCH 09/10/2019 31.4  26.0 - 34.0 pg Final  . MCHC 09/10/2019 33.3  30.0 - 36.0 g/dL Final  . RDW 09/10/2019 11.9  11.5 - 15.5 % Final  . Platelets 09/10/2019 202  150 - 400 K/uL Final  .  nRBC 09/10/2019 0.0  0.0 - 0.2 % Final   Performed at [facility], [place] [address] [place], [place] [address]  . ABO/RH(D) 09/10/2019 A POS   Final  . Antibody Screen 09/10/2019 NEG   Final  . Sample Expiration 09/10/2019    Final                   Value:09/13/2019,2359 Performed at [facility], [place] [address] [place], [place] [address]   . Fecal Occult Bld 09/10/2019 POSITIVE* NEGATIVE Final  . Lipase 09/10/2019 24  11 - 51 U/L Final   Performed at [facility], [place] [address] [place], [place] [address]    (this displays the last labs from the last 3 days)  No results found for: TOTALPROTELP, ALBUMINELP, A1GS, A2GS, BETS, BETA2SER, GAMS, MSPIKE, SPEI (this displays SPEP labs)  No results found for: KPAFRELGTCHN, LAMBDASER, KAPLAMBRATIO (kappa/lambda light chains)  No results found for: HGBA, HGBA2QUANT, HGBFQUANT, HGBSQUAN (Hemoglobinopathy evaluation)   No results found for: LDH  Lab Results  Component Value Date   IRON 59 12/18/2010   IRONPCTSAT 12.5 (L) 12/18/2010   (Iron and TIBC)  No results found for: FERRITIN  Urinalysis    Component Value Date/Time   COLORURINE YELLOW 06/04/2010 0845   APPEARANCEUR CLOUDY (A) 06/04/2010 0845   LABSPEC 1.011 06/04/2010 0845   PHURINE 7.5 06/04/2010 0845   GLUCOSEU NEGATIVE 06/04/2010 0845   HGBUR NEGATIVE 06/04/2010 0845   BILIRUBINUR NEGATIVE 06/04/2010 0845   KETONESUR NEGATIVE 06/04/2010 0845   PROTEINUR NEGATIVE 06/04/2010 0845   UROBILINOGEN 0.2 06/04/2010 0845   NITRITE NEGATIVE 06/04/2010 0845   LEUKOCYTESUR LARGE (A) 06/04/2010 0845     STUDIES:   ELIGIBLE FOR AVAILABLE RESEARCH PROTOCOL: no  ASSESSMENT: 79 y.o. [place] woman status post left breast upper outer quadrant biopsy 02/09/2017 for a clinical T1b N0, stage IA invasive ductal carcinoma, grade 1, estrogen and progesterone receptor positive, HER-2 nonamplified, with an MIB-1 of 2%  (1)  breast conserving surgery with sentinel lymph node sampling  03/16/2017 showed a pT1b pN0, stage IA invasive ductal carcinoma, grade 1, with negative margins (2) adjuvant radiation not anticipated  (2) adjuvant radiation not felt to be needed if patient takes antiestrogen for 5 years  (3) Tamoxifen  started 04/10/2017  (a) status post remote hysterectomy with bilateral salpingo-oophorectomy  (b) bone density at [facility] 03/30/2017 shows a T score of -1.0  PLAN: Brenda Stephens is   She knows to call for any questions that may arise between now and her next appointment.  We are happy to see her sooner if needed.  Total encounter time: *** minutes  Wilber Bihari, NP 03/30/20 8:48 AM Medical Oncology and Hematology [facility] [address] [place], [place] [address] Tel. [phone]    Fax. [phone]  *Total Encounter Time as defined by the Centers for Medicare and Medicaid Services includes, in addition to the face-to-face time of a patient visit (documented in the note above) non-face-to-face time: obtaining and reviewing outside history, ordering and reviewing medications, tests or procedures, care coordination (communications with other health care professionals or caregivers) and documentation in the medical record.

## 2020-03-30 NOTE — Telephone Encounter (Signed)
Called pt regarding appt 03/30/20. Pt states she forgot about appt and would like to r/s. Message sent to scheduling. Pt understands to expect call.

## 2020-04-10 ENCOUNTER — Other Ambulatory Visit: Payer: Self-pay

## 2020-04-10 DIAGNOSIS — C50411 Malignant neoplasm of upper-outer quadrant of right female breast: Secondary | ICD-10-CM

## 2020-04-10 DIAGNOSIS — Z170 Estrogen receptor positive status [ER+]: Secondary | ICD-10-CM

## 2020-04-11 ENCOUNTER — Telehealth: Payer: Self-pay | Admitting: Adult Health

## 2020-04-11 ENCOUNTER — Inpatient Hospital Stay: Payer: MEDICARE | Admitting: Adult Health

## 2020-04-11 ENCOUNTER — Inpatient Hospital Stay: Payer: MEDICARE

## 2020-04-11 ENCOUNTER — Telehealth: Payer: Self-pay

## 2020-04-11 NOTE — Progress Notes (Deleted)
Brenda Stephens  [phone] [phone]     ID: Brenda Stephens DOB: 09-06-1940  MR#: [id number]  ZCH#:[id number]  Patient Care Team: Arlyss Repress, MD as PCP - General (Internal Medicine) [name], MD as PCP - Cardiology (Cardiology) [name], [name], MD as Consulting Physician (Oncology) [name], MD as Consulting Physician (General Surgery) [name], MD as Consulting Physician (Gastroenterology) OTHER MD:   CHIEF COMPLAINT: Estrogen receptor positive breast cancer  CURRENT TREATMENT: Tamoxifen   INTERVAL HISTORY: Brenda Stephens returns today for follow-up and treatment of her estrogen receptor positive breast cancer.   Brenda Stephens continues on tamoxifen with good tolerance.      REVIEW OF SYSTEMS: Brenda Stephens repor HISTORY OF CURRENT ILLNESS: From the original intake note:  Brenda Stephens had routine bilateral screening mammography [facility]. An irregular lesion in the right breast upper outer quadrant was detected and she was recalled for right diagnostic mammography and ultrasonography 02/05/2017. This found the breast density to be category B. In the right breast upper quadrant there was an irregular mass which by ultrasound measured 0.8 cm. It was located at the 11:00 radiant posteriorly. The right axilla was sonographically benign.  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  The patient's subsequent history is as detailed below.   PAST MEDICAL HISTORY: Past Medical History:  Diagnosis Date  . Allergy    latex  . Anemia   . Anxiety   . Arthritis   . Cancer ([place]) 01/2017   right breast cancer  . Depression   . Dyslipidemia   . Eustachian tube  dysfunction   . GERD (gastroesophageal reflux disease)   . Hx of cardiovascular stress test    Myoview 10/13:  apical thinning, EF 72%, no ischemia  . Hx of colonic polyps    Dr Watt Climes  . Hypertension   . Hypothyroidism   . Other abnormal glucose     PAST SURGICAL HISTORY: Past Surgical History:  Procedure Laterality Date  . ABDOMINAL HYSTERECTOMY     BSO for uterine fibroids  . ACHILLES TENDON SURGERY Right 10/07/2012   Procedure: RIGHT REPAIR RUPTURE TENDON PRIMARY OPEN/PERCUTANEOUS ;  Surgeon: Ninetta Lights, MD;  Location: [facility];  Service: Orthopedics;  Laterality: Right;  . APPENDECTOMY    . BREAST LUMPECTOMY WITH RADIOACTIVE SEED AND SENTINEL LYMPH NODE BIOPSY Right 03/16/2017   Procedure: RIGHT BREAST LUMPECTOMY WITH RADIOACTIVE SEED AND RIGHT AXILLARY SENTINEL  NODE BIOPSY ERAS PATHWAY;  Surgeon: [name], MD;  Location: [facility];  Service: General;  Laterality: Right;  PECTORAL BLOCK  . CATARACT EXTRACTION, BILATERAL    . CHOLECYSTECTOMY    . COLONOSCOPY W/ POLYPECTOMY     Dr Watt Climes  . EAR CYST EXCISION Right 10/07/2012   Procedure: EXCISION BONE CYST BENIGN TUMOR [name] ;  Surgeon: Ninetta Lights, MD;  Location: [facility];  Service: Orthopedics;  Laterality: Right;  . EXPLORATORY LAPAROTOMY    . LEFT HEART CATH AND CORONARY ANGIOGRAPHY N/A 08/02/2019   Procedure: LEFT HEART CATH AND CORONARY ANGIOGRAPHY;  Surgeon: Martinique, Peter M, MD;  Location: [facility];  Service: Cardiovascular;  Laterality: N/A;  . REVISION TOTAL SHOULDER TO REVERSE TOTAL SHOULDER Left 01/12/2019  Procedure: REVISION TOTAL SHOULDER TO REVERSE TOTAL SHOULDER;  Surgeon: Hiram Gash, MD;  Location: [facility];  Service: Orthopedics;  Laterality: Left;  . SINUS SURGERY WITH INSTATRAK    . TOTAL SHOULDER REPLACEMENT     Right shoulder  . 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 TUBAL LIGATION      FAMILY HISTORY Family  History  Problem Relation Age of Onset  . Hiatal hernia Mother   . Heart attack Mother 71  . Heart disease Father 11       CAD @ autopsy  . Cancer Father        renal cancer  . Heart attack Brother 68       fatal  . Diabetes Neg Hx   . Stroke Neg Hx   The patient's father died from kidney cancer at age 43. The patient's mother died from a heart attack at age 49. The patient had one brother, who died from a heart attack. She had no sisters. There is no history of breast or ovarian cancer in the family.   GYNECOLOGIC HISTORY:  No LMP recorded. Patient has had a hysterectomy. Menarche: age 84 First live birth: age 56 [place] P38.  Hysterectomy? Yes BSO? Yes HR: yes, for between 5 and 10 years.   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 Her husband was Mudlogger of [place] for railroad company. He is retired but currently overseas the [employer] property as a Freight forwarder. Their daughter Brenda Stephens is an attorney but currently works as a Education officer, museum in [place]. The patient has no grandchildren. She is not a church attender    ADVANCED DIRECTIVES: Her husband is her healthcare power of attorney per [employer] MAINTENANCE: Social History   Tobacco Use  . Smoking status: Former Smoker    Packs/day: 0.50    Years: 6.00    Pack years: 3.00    Types: Cigarettes    Quit date: 06/30/1962    Years since quitting: 57.8  . Smokeless tobacco: Never Used  . Tobacco comment: Quit age 75  Vaping Use  . Vaping Use: Never used  Substance Use Topics  . Alcohol use: Yes    Alcohol/week: 3.0 standard drinks    Types: 3 Glasses of wine per week    Comment: wine  : < 3 glasses weekly  . Drug use: No     Colonoscopy:May got  PAP: Status post hysterectomy  Bone density: Due/Brenda Stephens   Allergies  Allergen Reactions  . Latex Rash  . Aspirin Other (See Comments)    GI bleeding  . Oxycodone-Acetaminophen Nausea Only  . Sulfa Antibiotics  Nausea Only  . Sulfamethoxazole-Trimethoprim Nausea Only    Current Outpatient Medications  Medication Sig Dispense Refill  . acetaminophen (TYLENOL) 500 MG tablet Take 1,000 mg by mouth 2 (two) times daily as needed for moderate pain.    Marland Kitchen atorvastatin (LIPITOR) 20 MG tablet TAKE 1 TABLET BY MOUTH AT BEDTIME (Patient taking differently: Take 20 mg by mouth at bedtime. ) 90 tablet 1  . diclofenac sodium (VOLTAREN) 1 % GEL Apply 1 application topically 2 (two) times a day.    . donepezil (ARICEPT) 5 MG tablet Take 5 mg by mouth at bedtime.    Marland Kitchen esomeprazole (NEXIUM) 20 MG capsule Take 20 mg by mouth at bedtime.    . fluticasone (FLONASE) 50 MCG/ACT nasal spray Place 2 sprays into both nostrils daily as  needed for allergies or rhinitis.     . metoprolol succinate (TOPROL-XL) 50 MG 24 hr tablet Take 1 tablet (50 mg total) by mouth daily. 90 tablet 3  . nitroGLYCERIN (NITROSTAT) 0.4 MG SL tablet Place 1 tablet (0.4 mg total) under the tongue every 5 (five) minutes as needed for chest pain. 25 tablet 3  . nystatin (MYCOSTATIN/NYSTOP) powder Apply 1 application topically 3 (three) times daily. 15 g 3  . nystatin cream (MYCOSTATIN) Apply 1 application topically 2 (two) times daily. 30 g 3  . ondansetron (ZOFRAN ODT) 8 MG disintegrating tablet Take 1 tablet (8 mg total) by mouth every 8 (eight) hours as needed for nausea or vomiting. 12 tablet 0  . ondansetron (ZOFRAN) 4 MG tablet Take 4 mg by mouth every 8 (eight) hours as needed for nausea or vomiting.     Marland Kitchen Phenylephrine HCl (SUDAFED PE CHILDRENS) 2.5 MG/5ML SOLN Take 5 mLs by mouth daily as needed (congestion).     . sennosides-docusate sodium (SENOKOT-S) 8.6-50 MG tablet Take 2 tablets by mouth daily as needed for constipation.    Marland Kitchen spironolactone-hydrochlorothiazide (ALDACTAZIDE) 25-25 MG per tablet Take 1 tablet by mouth daily. 90 tablet 3  . SYNTHROID 50 MCG tablet PT NEEDS COMPLETE PHYSICAL.  TAKE 1 TABLET (50MCG TOTAL) BY MOUTH DAILY. (Patient  taking differently: Take 50 mcg by mouth daily. ) 30 tablet 0  . tamoxifen (NOLVADEX) 20 MG tablet Take 1 tablet (20 mg total) by mouth daily. 90 tablet 0  . tamsulosin (FLOMAX) 0.4 MG CAPS capsule Take 0.4 mg by mouth daily.    Marland Kitchen triamcinolone cream (KENALOG) 0.1 % Apply 1 application topically 2 (two) times daily. 30 g 3  . venlafaxine XR (EFFEXOR-XR) 150 MG 24 hr capsule TAKE 1 CAPSULE (150 MG TOTAL) BY MOUTH DAILY. (Patient taking differently: Take 150 mg by mouth daily. ) 90 capsule 2  . verapamil (CALAN-SR) 240 MG CR tablet TAKE 1 TABLET (240 MG TOTAL) BY MOUTH AT BEDTIME. (Patient taking differently: Take 240 mg by mouth at bedtime. ) 30 tablet 0  . zolpidem (AMBIEN) 5 MG tablet Take 5 mg by mouth at bedtime.      No current facility-administered medications for this visit.   ppears well  OBJECTIVE: Older white woman in no acute distress  There were no vitals filed for this visit.   There is no height or weight on file to calculate BMI.   Wt Readings from Last 3 Encounters:  10/12/19 170 lb (77.1 kg)  09/10/19 185 lb 12.8 oz (84.3 kg)  08/24/19 185 lb 12.8 oz (84.3 kg)      ECOG FS:1 - Symptomatic but completely ambulatory GENERAL: Patient is a well appearing female in no acute distress HEENT:  Sclerae anicteric.  Oropharynx clear and moist. No ulcerations or evidence of oropharyngeal candidiasis. Neck is supple.  NODES:  No cervical, supraclavicular, or axillary lymphadenopathy palpated.  BREAST EXAM:  Deferred. LUNGS:  Clear to auscultation bilaterally.  No wheezes or rhonchi. HEART:  Regular rate and rhythm. No murmur appreciated. ABDOMEN:  Soft, nontender.  Positive, normoactive bowel sounds. No organomegaly palpated. MSK:  No focal spinal tenderness to palpation. Full range of motion bilaterally in the upper extremities. EXTREMITIES:  No peripheral edema.   SKIN:  Clear with no obvious rashes or skin changes. No nail dyscrasia. NEURO:  Nonfocal. Well oriented.  Appropriate  affect.   LAB RESULTS:  CMP     Component Value Date/Time   NA 135 09/10/2019 1029  NA 138 07/26/2019 1548   NA 132 (L) 03/13/2017 1252   K 4.3 09/10/2019 1029   K 4.3 03/13/2017 1252   CL 100 09/10/2019 1029   CO2 23 09/10/2019 1029   CO2 26 03/13/2017 1252   GLUCOSE 121 (H) 09/10/2019 1029   GLUCOSE 107 03/13/2017 1252   BUN 19 09/10/2019 1029   BUN 15 07/26/2019 1548   BUN 14.8 03/13/2017 1252   CREATININE 0.98 09/10/2019 1029   CREATININE 0.8 03/13/2017 1252   CALCIUM 10.7 (H) 09/10/2019 1029   CALCIUM 10.0 03/13/2017 1252   PROT 7.1 09/10/2019 1029   PROT 7.2 03/13/2017 1252   ALBUMIN 4.0 09/10/2019 1029   ALBUMIN 4.0 03/13/2017 1252   AST 29 09/10/2019 1029   AST 18 03/13/2017 1252   ALT 37 09/10/2019 1029   ALT 18 03/13/2017 1252   ALKPHOS 38 09/10/2019 1029   ALKPHOS 71 03/13/2017 1252   BILITOT 0.7 09/10/2019 1029   BILITOT 0.54 03/13/2017 1252   GFRNONAA 55 (L) 09/10/2019 1029   GFRAA >60 09/10/2019 1029    No results found for: TOTALPROTELP, ALBUMINELP, A1GS, A2GS, BETS, BETA2SER, GAMS, MSPIKE, SPEI  No results found for: Nils Pyle, [facility]  Lab Results  Component Value Date   WBC 6.7 09/10/2019   NEUTROABS 4.1 02/03/2019   HGB 13.4 09/10/2019   HCT 40.2 09/10/2019   MCV 94.1 09/10/2019   PLT 202 09/10/2019      Chemistry      Component Value Date/Time   NA 135 09/10/2019 1029   NA 138 07/26/2019 1548   NA 132 (L) 03/13/2017 1252   K 4.3 09/10/2019 1029   K 4.3 03/13/2017 1252   CL 100 09/10/2019 1029   CO2 23 09/10/2019 1029   CO2 26 03/13/2017 1252   BUN 19 09/10/2019 1029   BUN 15 07/26/2019 1548   BUN 14.8 03/13/2017 1252   CREATININE 0.98 09/10/2019 1029   CREATININE 0.8 03/13/2017 1252      Component Value Date/Time   CALCIUM 10.7 (H) 09/10/2019 1029   CALCIUM 10.0 03/13/2017 1252   ALKPHOS 38 09/10/2019 1029   ALKPHOS 71 03/13/2017 1252   AST 29 09/10/2019 1029   AST 18 03/13/2017 1252   ALT 37  09/10/2019 1029   ALT 18 03/13/2017 1252   BILITOT 0.7 09/10/2019 1029   BILITOT 0.54 03/13/2017 1252       No results found for: LABCA2  No components found for: QQPYPP509  No results for input(s): INR in the last 168 hours.  No results found for: LABCA2  No results found for: TOI712  No results found for: WPY099  No results found for: IPJ825  No results found for: CA2729  No components found for: HGQUANT  No results found for: CEA1 / No results found for: CEA1   No results found for: AFPTUMOR  No results found for: CHROMOGRNA  No results found for: PSA1  No visits with results within 3 Day(s) from this visit.  Latest known visit with results is:  Admission on 09/10/2019, Discharged on 09/10/2019  Component Date Value Ref Range Status  . Sodium 09/10/2019 135  135 - 145 mmol/L Final  . Potassium 09/10/2019 4.3  3.5 - 5.1 mmol/L Final  . Chloride 09/10/2019 100  98 - 111 mmol/L Final  . CO2 09/10/2019 23  22 - 32 mmol/L Final  . Glucose, Bld 09/10/2019 121* 70 - 99 mg/dL Final   Glucose reference range applies only to samples taken after fasting  for at least 8 hours.  . BUN 09/10/2019 19  8 - 23 mg/dL Final  . Creatinine, Ser 09/10/2019 0.98  0.44 - 1.00 mg/dL Final  . Calcium 09/10/2019 10.7* 8.9 - 10.3 mg/dL Final  . Total Protein 09/10/2019 7.1  6.5 - 8.1 g/dL Final  . Albumin 09/10/2019 4.0  3.5 - 5.0 g/dL Final  . AST 09/10/2019 29  15 - 41 U/L Final  . ALT 09/10/2019 37  0 - 44 U/L Final  . Alkaline Phosphatase 09/10/2019 38  38 - 126 U/L Final  . Total Bilirubin 09/10/2019 0.7  0.3 - 1.2 mg/dL Final  . GFR calc non Af Amer 09/10/2019 55* >60 mL/min Final  . GFR calc Af Amer 09/10/2019 >60  >60 mL/min Final  . Anion gap 09/10/2019 12  5 - 15 Final   Performed at [facility], [place] [address], [place], [place] [address]  . WBC 09/10/2019 6.7  4.0 - 10.5 K/uL Final  . RBC 09/10/2019 4.27  3.87 - 5.11 MIL/uL Final  . Hemoglobin  09/10/2019 13.4  12.0 - 15.0 g/dL Final  . HCT 09/10/2019 40.2  36 - 46 % Final  . MCV 09/10/2019 94.1  80.0 - 100.0 fL Final  . MCH 09/10/2019 31.4  26.0 - 34.0 pg Final  . MCHC 09/10/2019 33.3  30.0 - 36.0 g/dL Final  . RDW 09/10/2019 11.9  11.5 - 15.5 % Final  . Platelets 09/10/2019 202  150 - 400 K/uL Final  . nRBC 09/10/2019 0.0  0.0 - 0.2 % Final   Performed at [facility], [place] [address], [place], [place] [address]  . ABO/RH(D) 09/10/2019 A POS   Final  . Antibody Screen 09/10/2019 NEG   Final  . Sample Expiration 09/10/2019    Final                   Value:09/13/2019,2359 Performed at [facility], [place] [address] [place], [place] [address]   . Fecal Occult Bld 09/10/2019 POSITIVE* NEGATIVE Final  . Lipase 09/10/2019 24  11 - 51 U/L Final   Performed at [facility], [place] [address] [place], [place] [address]    (this displays the last labs from the last 3 days)  No results found for: TOTALPROTELP, ALBUMINELP, A1GS, A2GS, BETS, BETA2SER, GAMS, MSPIKE, SPEI (this displays SPEP labs)  No results found for: KPAFRELGTCHN, LAMBDASER, KAPLAMBRATIO (kappa/lambda light chains)  No results found for: HGBA, HGBA2QUANT, HGBFQUANT, HGBSQUAN (Hemoglobinopathy evaluation)   No results found for: LDH  Lab Results  Component Value Date   IRON 59 12/18/2010   IRONPCTSAT 12.5 (L) 12/18/2010   (Iron and TIBC)  No results found for: FERRITIN  Urinalysis    Component Value Date/Time   COLORURINE YELLOW 06/04/2010 0845   APPEARANCEUR CLOUDY (A) 06/04/2010 0845   LABSPEC 1.011 06/04/2010 0845   PHURINE 7.5 06/04/2010 0845   GLUCOSEU NEGATIVE 06/04/2010 0845   HGBUR NEGATIVE 06/04/2010 0845   BILIRUBINUR NEGATIVE 06/04/2010 0845   KETONESUR NEGATIVE 06/04/2010 0845   PROTEINUR NEGATIVE 06/04/2010 0845   UROBILINOGEN 0.2 06/04/2010 0845   NITRITE NEGATIVE 06/04/2010 0845   LEUKOCYTESUR LARGE (A) 06/04/2010 0845      STUDIES: No results found.  ELIGIBLE FOR AVAILABLE RESEARCH PROTOCOL: no  ASSESSMENT: 79 y.o.  woman status post left breast upper outer quadrant biopsy 02/09/2017 for a clinical T1b N0, stage IA invasive ductal carcinoma, grade 1, estrogen and progesterone receptor positive, HER-2 nonamplified, with  an MIB-1 of 2%  (1) breast conserving surgery with sentinel lymph node sampling  03/16/2017 showed a pT1b pN0, stage IA invasive ductal carcinoma, grade 1, with negative margins (2) adjuvant radiation not anticipated  (2) adjuvant radiation not felt to be needed if patient takes antiestrogen for 5 years  (3) Tamoxifen started 04/10/2017  (a) status post remote hysterectomy with bilateral salpingo-oophorectomy  (b) bone density at [facility] 03/30/2017 shows a T score of -1.0  PLAN: Natoya is   Total encounter time: *** minutes  *Total Encounter Time as defined by the Centers for Medicare and Medicaid Services includes, in addition to the face-to-face time of a patient visit (documented in the note above) non-face-to-face time: obtaining and reviewing outside history, ordering and reviewing medications, tests or procedures, care coordination (communications with other health care professionals or caregivers) and documentation in the medical record.  Wilber Bihari, NP 04/11/20 8:21 AM Medical Oncology and Hematology [facility] [place], [place] [address] Tel. [phone]    Fax. [phone]

## 2020-04-11 NOTE — Telephone Encounter (Signed)
Called pt per 10/13 sch msg - unable to reach pt. Left message for patient with appt date and time on vmail.

## 2020-04-11 NOTE — Telephone Encounter (Signed)
Called pt as she did not show for 0830 appt with Wilber Bihari, NP.Marland Kitchen Pt states she forgot she had an appt. This nurse explained importance of f/u with provider. Pt states she must not have written it down. Message sent to scheduling asking them to make sure pt writes down date when they call her to schedule.

## 2020-04-17 ENCOUNTER — Telehealth: Payer: Self-pay | Admitting: Adult Health

## 2020-04-17 NOTE — Telephone Encounter (Signed)
Rescheduled per provider. Called and left a msg

## 2020-04-20 ENCOUNTER — Other Ambulatory Visit: Payer: MEDICARE

## 2020-04-20 ENCOUNTER — Ambulatory Visit: Payer: MEDICARE | Admitting: Adult Health

## 2020-04-24 ENCOUNTER — Encounter: Payer: Self-pay | Admitting: Obstetrics & Gynecology

## 2020-04-24 ENCOUNTER — Ambulatory Visit (INDEPENDENT_AMBULATORY_CARE_PROVIDER_SITE_OTHER): Payer: MEDICARE | Admitting: Obstetrics & Gynecology

## 2020-04-24 ENCOUNTER — Other Ambulatory Visit: Payer: Self-pay

## 2020-04-24 ENCOUNTER — Other Ambulatory Visit: Payer: Self-pay | Admitting: Obstetrics & Gynecology

## 2020-04-24 VITALS — BP 138/70 | Ht 61.0 in | Wt 187.0 lb

## 2020-04-24 DIAGNOSIS — C50411 Malignant neoplasm of upper-outer quadrant of right female breast: Secondary | ICD-10-CM

## 2020-04-24 DIAGNOSIS — Z780 Asymptomatic menopausal state: Secondary | ICD-10-CM

## 2020-04-24 DIAGNOSIS — B372 Candidiasis of skin and nail: Secondary | ICD-10-CM | POA: Diagnosis not present

## 2020-04-24 DIAGNOSIS — Z9289 Personal history of other medical treatment: Secondary | ICD-10-CM | POA: Diagnosis not present

## 2020-04-24 DIAGNOSIS — Z01419 Encounter for gynecological examination (general) (routine) without abnormal findings: Secondary | ICD-10-CM | POA: Diagnosis not present

## 2020-04-24 DIAGNOSIS — Z6835 Body mass index (BMI) 35.0-35.9, adult: Secondary | ICD-10-CM

## 2020-04-24 DIAGNOSIS — Z170 Estrogen receptor positive status [ER+]: Secondary | ICD-10-CM

## 2020-04-24 DIAGNOSIS — Z23 Encounter for immunization: Secondary | ICD-10-CM | POA: Diagnosis not present

## 2020-04-24 DIAGNOSIS — E66812 Obesity, class 2: Secondary | ICD-10-CM

## 2020-04-24 MED ORDER — NYSTATIN 100000 UNIT/GM EX POWD: 1.0000 "application " | Freq: Two times a day (BID) | CUTANEOUS | 3 refills | Status: DC

## 2020-04-24 MED ORDER — CLOBETASOL PROP EMOLLIENT BASE 0.05 % EX CREA: 1.0000 "application " | TOPICAL_CREAM | Freq: Every day | CUTANEOUS | 3 refills | Status: DC

## 2020-04-24 NOTE — Progress Notes (Signed)
Brenda Stephens 1941/02/07 [id number]   History:    79 y.o. G1P1L1 Widowed, husband died in 02/12/2020.  Daughter is single, no children.  RP:  Established patient presenting for annual gyn exam   HPI:  S/P TAH/BSO.  Postmenopause, no HRT.  No pelvic pain.  Abstinent.  Pap neg 2019. Urine/BMs wnl. Diagnosis of a Right breast invasive ductal Ca, grade 1 (ER and PR positive) on 02/09/2017.  Had a Rt Breast Lumpectomy/[place] Node with Radioactive seed 03/16/2017, Stage 1.  On Tamoxifen.  Followed by Dr Jana Hakim.  Scheduled for Mammo next week. Persistent under breasts/armpits rashes helped but not resolved with Nystatin powder.  BMI 35.33.  Planning to resume walks.  Health labs with Fam MD.  Brenda Stephens 2021.  BD 2018 low normal.    Past medical history,surgical history, family history and social history were all reviewed and documented in the EPIC chart.  Gynecologic History No LMP recorded. Patient has had a hysterectomy.  Obstetric History OB History  Gravida Para Term Preterm AB Living  1 1       1   SAB TAB Ectopic Multiple Live Births               # Outcome Date GA Lbr Len/2nd Weight Sex Delivery Anes PTL Lv  1 Para              ROS: A ROS was performed and pertinent positives and negatives are included in the history.  GENERAL: No fevers or chills. HEENT: No change in vision, no earache, sore throat or sinus congestion. NECK: No pain or stiffness. CARDIOVASCULAR: No chest pain or pressure. No palpitations. PULMONARY: No shortness of breath, cough or wheeze. GASTROINTESTINAL: No abdominal pain, nausea, vomiting or diarrhea, melena or bright red blood per rectum. GENITOURINARY: No urinary frequency, urgency, hesitancy or dysuria. MUSCULOSKELETAL: No joint or muscle pain, no back pain, no recent trauma. DERMATOLOGIC: No rash, no itching, no lesions. ENDOCRINE: No polyuria, polydipsia, no heat or cold intolerance. No recent change in weight. HEMATOLOGICAL: No anemia or easy bruising or  bleeding. NEUROLOGIC: No headache, seizures, numbness, tingling or weakness. PSYCHIATRIC: No depression, no loss of interest in normal activity or change in sleep pattern.     Exam:   BP 138/70   Ht 5\' 1"  (1.549 m)   Wt 187 lb (84.8 kg)   BMI 35.33 kg/m   Body mass index is 35.33 kg/m.  General appearance : Well developed well nourished female. No acute distress HEENT: Eyes: no retinal hemorrhage or exudates,  Neck supple, trachea midline, no carotid bruits, no thyroidmegaly Lungs: Clear to auscultation, no rhonchi or wheezes, or rib retractions  Heart: Regular rate and rhythm, no murmurs or gallops Breast:Examined in sitting and supine position were symmetrical in appearance, no palpable masses or tenderness,  no skin retraction, no nipple inversion, no nipple discharge, no skin discoloration, no axillary or supraclavicular lymphadenopathy.  Erythema at skin under the breasts and armpits. Abdomen: no palpable masses or tenderness, no rebound or guarding Extremities: no edema or skin discoloration or tenderness  Pelvic: Vulva: Normal             Vagina: No gross lesions or discharge  Cervix/Uterus absent  Adnexa  Without masses or tenderness  Anus: Normal   Assessment/Plan:  79 y.o. female for annual exam   1. Well female exam with routine gynecological exam Gynecologic exam status post TAH/BSO. No indication for Pap test at this time. Breast exam normal. Screening mammogram scheduled  next week. History of right breast cancer followed by Dr. Jana Hakim. Colonoscopy April 2021. Health labs with family physician.  2. Postmenopause Postmenopausal, well on no hormone replacement therapy. Last bone density was low normal with the lowest T score at -1.0 in 2018. We will repeat a bone density at 5 years maximum. Vitamin D supplements, calcium intake of 1500 mg daily and regular weightbearing physical activities.  3. Yeast infection of the skin Persistent yeast of the skin under the  breasts and at the axilla bilaterally. Nystatin powder helping the symptoms but the erythema persists. Probable chronic inflammation. Will add clobetasol 0.05% cream a thin layer to the affected skin as needed. Nystatin powder as needed as well. Both prescriptions sent to pharmacy.  4. Malignant neoplasm of upper-outer quadrant of right breast in female, estrogen receptor positive ([place]) Followed by Dr Jana Hakim.  Mammo scheduled next week.  5. Class 2 severe obesity due to excess calories with serious comorbidity and body mass index (BMI) of 35.0 to 35.9 in adult [facility]) Recommend a lower calorie/carb diet. Aerobic activities in the form of walking recommended 5 times a week and light weightlifting every 2 days.  6. Flu vaccine need - Flu Vaccine QUAD High Dose(Fluad)  Other orders - nystatin (MYCOSTATIN/NYSTOP) powder; Apply 1 application topically 2 (two) times daily. - Clobetasol Prop Emollient Base 0.05 % emollient cream; Apply 1 application topically daily. Thin layer on affected areas as needed.  Princess Bruins MD, 2:12 PM 04/24/2020

## 2020-04-24 NOTE — Telephone Encounter (Signed)
Note from pharmacy regarding Clobetasol. CHANGE REQUESTED.  Pharmacy comment: Alternative Requested:THE PRESCRIBED MEDICATION IS NOT COVERED BY INSURANCE. PLEASE CONSIDER CHANGING TO ONE OF THE SUGGESTED COVERED ALTERNATIVES.  Alternative is attached. Please sign and add directions if Rx okay to replace Clobetasol.

## 2020-04-25 DIAGNOSIS — I679 Cerebrovascular disease, unspecified: Secondary | ICD-10-CM | POA: Insufficient documentation

## 2020-04-30 ENCOUNTER — Encounter: Payer: Self-pay | Admitting: Obstetrics & Gynecology

## 2020-05-08 NOTE — Progress Notes (Signed)
Subjective:    Patient ID: Brenda Stephens, female    DOB: [birth year], 79 y.o.   MRN: [id number]  HPI F former smoker followed for allergic rhinitis, Bronchitis, complicated by HBP, GERD, Anemia, R breast cancer Walk Test 10/12/19- desat to 89% with max HR 95. Mostly sats around 94-96.  ------------------------------------------------------------------------------------   10/12/19- 79 yoF former smoker followed for allergic rhinitis, acute bronchitis,  complicated by HBP, GERD, Anemia, R breast cancer -----f/u Allergic rhimitis.  Cardiac cath wnl 08/02/19   EF 55-65% Husband here Reports 2-3 months awareness of Doe, esp climbing stairs- may stop half way to rest. In the past she was evaluated for chest pain with similar activity. Ok at rest. Denies cough or wheeze, chest pain or palpitation. Eval at [facility] ER for bradycardia in 40's. Has seen Dr Percival Spanish Cardiology.  Feet swell- chronic. No calf pain.  GI eval for food hang-up.  Walk Test 10/12/19- desat to 89% with max HR 95. Mostly sats around 94-96. CXR- recent outside our system. Husband says it was "ok, clear".  05/09/20- 65 yoF former smoker followed for allergic rhinitis, acute bronchitis,  complicated by HBP, GERD, Anemia, R breast cancer Covid vax-2 Phizer Flu vax-had Was scheduled today for PFT but could not perform. Per tech, pt was unable to sustain exhalation long enough. Here today with daughter. C/O weakness, easy DOE, "no energy". Now completing course doxycycline [facility] for cough and malaise treating presumptive  Bronchitis/ pneumonia per pt, but was told CXR didn't show pneumonia. Chest feels better, little cough now. No longer wheeze. Using albuterol hfa but cant tell it helps. Always mild ankle edema- not worse, no calf pain, chest pain, blood or purulent.  Current PCP is too far away- daughter asks help finding someone closer.   ROS-see HPI   + = positive Constitutional:   No-   weight loss, night sweats,  fevers, chills, fatigue, lassitude. HEENT:   + headaches, difficulty swallowing, tooth/dental problems, sore throat,       No-  sneezing, itching, ear ache, nasal congestion, +post nasal drip,  CV:  No-   chest pain, orthopnea, PND, swelling in lower extremities, anasarca,  dizziness, palpitations Resp: + shortness of breath with exertion or at rest.               productive cough,  No non-productive cough,  No- coughing up of blood.              No-   change in color of mucus.  No- wheezing.   Skin: No-   rash or lesions. GI:  No-   heartburn, indigestion, abdominal pain, nausea, vomiting, GU:  MS:  No-   joint pain or swelling.  Neuro-     nothing unusual Psych:  No- change in mood or affect. No depression or anxiety.  No memory loss.    Objective:  OBJ- Physical Exam General- Alert, Oriented, Affect-appropriate, Distress- none acute, + overweight Skin- rash-none, lesions- none, excoriation- none Lymphadenopathy- none Head- atraumatic            Eyes- Gross vision intact, PERRLA, conjunctivae and secretions clear            Ears- Hearing ok,             Nose-, no-Septal dev,  polyps, erosion, perforation             Throat- Mallampati III , mucosa clear , drainage- none, tonsils- atrophic Neck- flexible , trachea midline, no  stridor , thyroid nl, carotid no bruit Chest - symmetrical excursion , unlabored           Heart/CV- RRR , no murmur , no gallop  , no rub, nl s1 s2                           - JVD- none , edema+1, stasis changes- none, varices- none           Lung- clear to P&A, wheeze- none, cough none, dullness-none, rub- none           Chest wall-  Abd- Br/ Gen/ Rectal- Not done, not indicated Extrem- cyanosis- none, clubbing, none, atrophy- none, strength- nl.  Neuro- grossly intact to observation Assessment & Plan:

## 2020-05-09 ENCOUNTER — Encounter: Payer: Self-pay | Admitting: Internal Medicine

## 2020-05-09 ENCOUNTER — Other Ambulatory Visit: Payer: Self-pay

## 2020-05-09 ENCOUNTER — Ambulatory Visit (INDEPENDENT_AMBULATORY_CARE_PROVIDER_SITE_OTHER): Payer: MEDICARE | Admitting: Internal Medicine

## 2020-05-09 VITALS — BP 120/80 | HR 85 | Temp 97.1°F | Ht 62.0 in | Wt 185.6 lb

## 2020-05-09 DIAGNOSIS — R0609 Other forms of dyspnea: Secondary | ICD-10-CM

## 2020-05-09 DIAGNOSIS — I200 Unstable angina: Secondary | ICD-10-CM | POA: Diagnosis not present

## 2020-05-09 DIAGNOSIS — R0600 Dyspnea, unspecified: Secondary | ICD-10-CM

## 2020-05-09 NOTE — Patient Instructions (Addendum)
   Order- Schedule   Office spirometry  And 6 minute walk test  Dx Dyspnea on exertion  Order- [facility] please help referral to establish with primary care  Order- overnight oximetry on room air    Dx Dyspnea on exertion  Try off albuterol inhaler a few days, then restart. See if you can appreciate any difference in your breathing.  You can just use it if you notice wheezing, if that's when it helps.

## 2020-05-09 NOTE — Assessment & Plan Note (Signed)
Weakness and "no energy" are her main complaints. Treated recently for bronchitis/ pneumonia at urgent care and now finishing 2 weeks doxy. CXR reported clear. On thyroid supplement. Recently CMP and CBC in [place]. I/m not sure if she has significant pulmonary disease.  Plan- Office spirometry, 6MWT, overnight oximetry, consider PT referral, f/u w cardiology as planned. She will retry albuterol hfa at least if she notes wheeze, to see if it helps.

## 2020-05-09 NOTE — Assessment & Plan Note (Signed)
Not complaining of chest pain at this visit. Known cardiac disease, so might contribute to her c/o "no energy".  Plan- f/u with cardiology

## 2020-05-23 ENCOUNTER — Encounter: Payer: Self-pay | Admitting: Internal Medicine

## 2020-05-25 ENCOUNTER — Other Ambulatory Visit: Payer: Self-pay

## 2020-05-25 DIAGNOSIS — C50411 Malignant neoplasm of upper-outer quadrant of right female breast: Secondary | ICD-10-CM

## 2020-05-27 ENCOUNTER — Other Ambulatory Visit: Payer: Self-pay | Admitting: Oncology

## 2020-05-27 NOTE — Progress Notes (Signed)
Brenda Stephens  [phone] [phone]     ID: Brenda Stephens DOB: 1941-03-21  MR#: [id number]  YIF#:[id number]  Patient Care Team: Arlyss Repress, MD as PCP - General (Internal Medicine) [name], MD as PCP - Cardiology (Cardiology) Ellanor Feuerstein, [name], MD as Consulting Physician (Oncology) [name], MD as Consulting Physician (General Surgery) [name], MD as Consulting Physician (Gastroenterology) OTHER MD:   CHIEF COMPLAINT: Estrogen receptor positive breast cancer  CURRENT TREATMENT: Tamoxifen   INTERVAL HISTORY: Brenda Stephens returns today for follow-up of her estrogen receptor positive breast cancer.   Brenda Stephens continues on tamoxifen with good tolerance. Hot flashes and vaginal dryness are not an issue.  Since her last visit, she underwent bilateral diagnostic mammography with tomography at [facility] on 04/30/2020.  Showing: breast density category B; no evidence of malignancy in either breast.  Specifically the asymmetric right density previously noted represents normal tissue and has not changed.  There are no significant masses calcifications or other findings in either breast.  Follow-up mammography in 12 months was recommended.   REVIEW OF SYSTEMS: Brenda Stephens's husband died suddenly from some pulmonary event in July 2021. The patient lives with her daughter who works as a Education officer, museum. Brenda Stephens has not yet filled out new healthcare power of attorney papers. There have been no falls no unusual headaches no visual changes no cough phlegm production pleurisy no change in bowel or bladder habits. She is currently not exercising. A detailed review of systems was otherwise stable   COVID 19 VACCINATION STATUS: Status post [place] [facility] x2, most recently February 2020; no booster as of November 2021    HISTORY OF CURRENT ILLNESS: From the original intake note:  Brenda Stephens had routine bilateral screening mammography at [facility] 01/28/2017. An irregular  lesion in the right breast upper outer quadrant was detected and she was recalled for right diagnostic mammography and ultrasonography 02/05/2017. This found the breast density to be category B. In the right breast upper quadrant there was an irregular mass which by ultrasound measured 0.8 cm. It was located at the 11:00 radiant posteriorly. The right axilla was sonographically benign.  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  The patient's subsequent history is as detailed below.   PAST MEDICAL HISTORY: Past Medical History:  Diagnosis Date  . Allergy    latex  . Anemia   . Anxiety   . Arthritis   . Cancer ([place]) 01/2017   right breast cancer  . Depression   . Dyslipidemia   . Eustachian tube dysfunction   . GERD (gastroesophageal reflux disease)   . Hx of cardiovascular stress test    Myoview 10/13:  apical thinning, EF 72%, no ischemia  . Hx of colonic polyps    Dr Watt Climes  . Hypertension   . Hypothyroidism   . Other abnormal glucose     PAST SURGICAL HISTORY: Past Surgical History:  Procedure Laterality Date  . ABDOMINAL HYSTERECTOMY     BSO for uterine fibroids  . ACHILLES TENDON SURGERY Right 10/07/2012   Procedure: RIGHT REPAIR RUPTURE TENDON PRIMARY OPEN/PERCUTANEOUS ;  Surgeon: Ninetta Lights, MD;  Location: [place];  Service: Orthopedics;  Laterality: Right;  . APPENDECTOMY    . BREAST LUMPECTOMY WITH RADIOACTIVE  SEED AND SENTINEL LYMPH NODE BIOPSY Right 03/16/2017   Procedure: RIGHT BREAST LUMPECTOMY WITH RADIOACTIVE SEED AND RIGHT AXILLARY SENTINEL  NODE BIOPSY ERAS PATHWAY;  Surgeon: [name], MD;  Location: [place];  Service: General;   Laterality: Right;  PECTORAL BLOCK  . CATARACT EXTRACTION, BILATERAL    . CHOLECYSTECTOMY    . COLONOSCOPY W/ POLYPECTOMY     Dr Watt Climes  . EAR CYST EXCISION Right 10/07/2012   Procedure: EXCISION BONE CYST BENIGN TUMOR [name] ;  Surgeon: Ninetta Lights, MD;  Location: [place];  Service: Orthopedics;  Laterality: Right;  . EXPLORATORY LAPAROTOMY    . LEFT HEART CATH AND CORONARY ANGIOGRAPHY N/A 08/02/2019   Procedure: LEFT HEART CATH AND CORONARY ANGIOGRAPHY;  Surgeon: Martinique, Peter M, MD;  Location: [place] [facility];  Service: Cardiovascular;  Laterality: N/A;  . REVISION TOTAL SHOULDER TO REVERSE TOTAL SHOULDER Left 01/12/2019   Procedure: REVISION TOTAL SHOULDER TO REVERSE TOTAL SHOULDER;  Surgeon: Hiram Gash, MD;  Location: [facility];  Service: Orthopedics;  Laterality: Left;  . SINUS SURGERY WITH INSTATRAK    . TOTAL SHOULDER REPLACEMENT     Right shoulder  . 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 TUBAL LIGATION      FAMILY HISTORY Family History  Problem Relation Age of Onset  . Hiatal hernia Mother   . Heart attack Mother 77  . Heart disease Father 62       CAD @ autopsy  . Cancer Father        renal cancer  . Heart attack Brother 43       fatal  . Diabetes Neg Hx   . Stroke Neg Hx   The patient's father died from kidney cancer at age 52. The patient's mother died from a heart attack at age 51. The patient had one brother, who died from a heart attack. She had no sisters. There is no history of breast or ovarian cancer in the family.   GYNECOLOGIC HISTORY:  No LMP recorded. Patient has had a hysterectomy. Menarche: age 81 First live birth: age 61 Brenda Stephens P76.  Hysterectomy? Yes BSO? Yes HR: yes, for between 5 and 10 years.   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 Her husband was Brenda Stephens of [place] for railroad company. He retired then worked as Special educational needs teacher. He died Jan 27, 2020. Their daughter Brenda Stephens is an attorney but currently works as a Education officer, museum in [place]. The patient has no grandchildren. She is not a church attender    ADVANCED DIRECTIVES: The patient intends to name her daughter [name] as her healthcare power of attorney. The appropriate papers were given to her to complete and notarized at her discretion at the 05/29/2019 visit.  HEALTH MAINTENANCE: Social History   Tobacco Use  . Smoking status: Former Smoker    Packs/day: 0.50    Years: 6.00    Pack years: 3.00    Types: Cigarettes    Quit date: 06/30/1962    Years since quitting: 57.9  . Smokeless tobacco: Never Used  . Tobacco comment: Quit age 31  Vaping Use  . Vaping Use: Never used  Substance Use Topics  . Alcohol use: Yes    Alcohol/week: 3.0 standard drinks    Types: 3 Glasses of wine per week    Comment: wine  : < 3 glasses weekly  . Drug use: No  Colonoscopy:May got  PAP: Status post hysterectomy  Bone density: Due/[facility]   Allergies  Allergen Reactions  . Latex Rash  . Aspirin Other (See Comments)    GI bleeding  . Oxycodone-Acetaminophen Nausea Only  . Sulfa Antibiotics Nausea Only  . Sulfamethoxazole-Trimethoprim Nausea Only    Current Outpatient Medications  Medication Sig Dispense Refill  . acetaminophen (TYLENOL) 500 MG tablet Take 1,000 mg by mouth 2 (two) times daily as needed for moderate pain.    Marland Kitchen atorvastatin (LIPITOR) 20 MG tablet TAKE 1 TABLET BY MOUTH AT BEDTIME (Patient taking differently: Take 20 mg by mouth at bedtime. ) 90 tablet 1  . diclofenac sodium (VOLTAREN) 1 % GEL Apply 1 application topically 2 (two) times a day.    . donepezil (ARICEPT) 5 MG tablet Take 5 mg by mouth at bedtime.    Marland Kitchen esomeprazole (NEXIUM) 20 MG capsule Take 20 mg by mouth at bedtime.    . fluticasone (FLONASE) 50 MCG/ACT nasal spray Place 2 sprays into both nostrils daily as needed for allergies or rhinitis.     . halobetasol (ULTRAVATE) 0.05 % cream Apply topically  daily. 50 g 3  . metoprolol succinate (TOPROL-XL) 50 MG 24 hr tablet Take 1 tablet (50 mg total) by mouth daily. 90 tablet 3  . nitroGLYCERIN (NITROSTAT) 0.4 MG SL tablet Place 1 tablet (0.4 mg total) under the tongue every 5 (five) minutes as needed for chest pain. 25 tablet 3  . nystatin (MYCOSTATIN/NYSTOP) powder Apply 1 application topically 2 (two) times daily. 15 g 3  . ondansetron (ZOFRAN ODT) 8 MG disintegrating tablet Take 1 tablet (8 mg total) by mouth every 8 (eight) hours as needed for nausea or vomiting. 12 tablet 0  . ondansetron (ZOFRAN) 4 MG tablet Take 4 mg by mouth every 8 (eight) hours as needed for nausea or vomiting.     Marland Kitchen Phenylephrine HCl (SUDAFED PE CHILDRENS) 2.5 MG/5ML SOLN Take 5 mLs by mouth daily as needed (congestion).     . sennosides-docusate sodium (SENOKOT-S) 8.6-50 MG tablet Take 2 tablets by mouth daily as needed for constipation.    Marland Kitchen spironolactone-hydrochlorothiazide (ALDACTAZIDE) 25-25 MG per tablet Take 1 tablet by mouth daily. 90 tablet 3  . SYNTHROID 50 MCG tablet PT NEEDS COMPLETE PHYSICAL.  TAKE 1 TABLET (50MCG TOTAL) BY MOUTH DAILY. (Patient taking differently: Take 50 mcg by mouth daily. ) 30 tablet 0  . tamoxifen (NOLVADEX) 20 MG tablet TAKE 1 TABLET BY MOUTH EVERY DAY 90 tablet 0  . tamsulosin (FLOMAX) 0.4 MG CAPS capsule Take 0.4 mg by mouth daily.    Marland Kitchen triamcinolone cream (KENALOG) 0.1 % Apply 1 application topically 2 (two) times daily. 30 g 3  . venlafaxine XR (EFFEXOR-XR) 150 MG 24 hr capsule TAKE 1 CAPSULE (150 MG TOTAL) BY MOUTH DAILY. (Patient taking differently: Take 150 mg by mouth daily. ) 90 capsule 2  . verapamil (CALAN-SR) 240 MG CR tablet TAKE 1 TABLET (240 MG TOTAL) BY MOUTH AT BEDTIME. (Patient taking differently: Take 240 mg by mouth at bedtime. ) 30 tablet 0  . zolpidem (AMBIEN) 5 MG tablet Take 5 mg by mouth at bedtime.      No current facility-administered medications for this visit.    OBJECTIVE: White woman who appears stated  age  51:   05/28/20 1055  BP: (!) 161/76  Pulse: (!) 105  Resp: 18  Temp: 98.1 F (36.7 C)  SpO2: 100%     Body mass index is 33.76 kg/m.  Wt Readings from Last 3 Encounters:  05/28/20 184 lb 9.6 oz (83.7 kg)  05/09/20 185 lb 9.6 oz (84.2 kg)  04/24/20 187 lb (84.8 kg)      ECOG FS:1 - Symptomatic but completely ambulatory  Sclerae unicteric, EOMs intact Wearing a mask No cervical or supraclavicular adenopathy Lungs no rales or rhonchi Heart regular rate and rhythm Abd soft, nontender, positive bowel sounds MSK no focal spinal tenderness, no upper extremity lymphedema Neuro: nonfocal, well oriented, appropriate affect Breasts: The right breast is benign per the left breast is status post lumpectomy and radiation. There is no evidence of local recurrence. Both axillae are benign.   LAB RESULTS:  CMP     Component Value Date/Time   NA 135 09/10/2019 1029   NA 138 07/26/2019 1548   NA 132 (L) 03/13/2017 1252   K 4.3 09/10/2019 1029   K 4.3 03/13/2017 1252   CL 100 09/10/2019 1029   CO2 23 09/10/2019 1029   CO2 26 03/13/2017 1252   GLUCOSE 121 (H) 09/10/2019 1029   GLUCOSE 107 03/13/2017 1252   BUN 19 09/10/2019 1029   BUN 15 07/26/2019 1548   BUN 14.8 03/13/2017 1252   CREATININE 0.98 09/10/2019 1029   CREATININE 0.8 03/13/2017 1252   CALCIUM 10.7 (H) 09/10/2019 1029   CALCIUM 10.0 03/13/2017 1252   PROT 7.1 09/10/2019 1029   PROT 7.2 03/13/2017 1252   ALBUMIN 4.0 09/10/2019 1029   ALBUMIN 4.0 03/13/2017 1252   AST 29 09/10/2019 1029   AST 18 03/13/2017 1252   ALT 37 09/10/2019 1029   ALT 18 03/13/2017 1252   ALKPHOS 38 09/10/2019 1029   ALKPHOS 71 03/13/2017 1252   BILITOT 0.7 09/10/2019 1029   BILITOT 0.54 03/13/2017 1252   GFRNONAA 55 (L) 09/10/2019 1029   GFRAA >60 09/10/2019 1029    Lab Results  Component Value Date   WBC 9.9 05/28/2020   NEUTROABS 6.7 05/28/2020   HGB 13.5 05/28/2020   HCT 41.1 05/28/2020   MCV 91.7 05/28/2020   PLT  236 05/28/2020      Chemistry      Component Value Date/Time   NA 135 09/10/2019 1029   NA 138 07/26/2019 1548   NA 132 (L) 03/13/2017 1252   K 4.3 09/10/2019 1029   K 4.3 03/13/2017 1252   CL 100 09/10/2019 1029   CO2 23 09/10/2019 1029   CO2 26 03/13/2017 1252   BUN 19 09/10/2019 1029   BUN 15 07/26/2019 1548   BUN 14.8 03/13/2017 1252   CREATININE 0.98 09/10/2019 1029   CREATININE 0.8 03/13/2017 1252      Component Value Date/Time   CALCIUM 10.7 (H) 09/10/2019 1029   CALCIUM 10.0 03/13/2017 1252   ALKPHOS 38 09/10/2019 1029   ALKPHOS 71 03/13/2017 1252   AST 29 09/10/2019 1029   AST 18 03/13/2017 1252   ALT 37 09/10/2019 1029   ALT 18 03/13/2017 1252   BILITOT 0.7 09/10/2019 1029   BILITOT 0.54 03/13/2017 1252       No results found for: LABCA2  No components found for: IYMEBR830  No results for input(s): INR in the last 168 hours.  No results found for: LABCA2  No results found for: NMM768  No results found for: GSU110  No results found for: RPR945  No results found for: CA2729  No components found for: HGQUANT  No results found for: CEA1 / No results found for: CEA1   No results found for: [facility]  No results found for: CHROMOGRNA  No results found for: TOTALPROTELP, ALBUMINELP, A1GS, A2GS, BETS, BETA2SER, GAMS, MSPIKE, SPEI (this displays SPEP labs)  No results found for: KPAFRELGTCHN, LAMBDASER, KAPLAMBRATIO (kappa/lambda light chains)  No results found for: HGBA, HGBA2QUANT, HGBFQUANT, HGBSQUAN (Hemoglobinopathy evaluation)   No results found for: LDH  Lab Results  Component Value Date   IRON 59 12/18/2010   IRONPCTSAT 12.5 (L) 12/18/2010   (Iron and TIBC)  No results found for: FERRITIN  Urinalysis    Component Value Date/Time   COLORURINE YELLOW 06/04/2010 0845   APPEARANCEUR CLOUDY (A) 06/04/2010 0845   LABSPEC 1.011 06/04/2010 0845   PHURINE 7.5 06/04/2010 0845   GLUCOSEU NEGATIVE 06/04/2010 0845   HGBUR NEGATIVE  06/04/2010 0845   BILIRUBINUR NEGATIVE 06/04/2010 0845   KETONESUR NEGATIVE 06/04/2010 0845   PROTEINUR NEGATIVE 06/04/2010 0845   UROBILINOGEN 0.2 06/04/2010 0845   NITRITE NEGATIVE 06/04/2010 0845   LEUKOCYTESUR LARGE (A) 06/04/2010 0845    STUDIES: No results found.   ELIGIBLE FOR AVAILABLE RESEARCH PROTOCOL: no  ASSESSMENT: 79 y.o. [place] woman status post left breast upper outer quadrant biopsy 02/09/2017 for a clinical T1b N0, stage IA invasive ductal carcinoma, grade 1, estrogen and progesterone receptor positive, HER-2 nonamplified, with an MIB-1 of 2%  (1) left lumpectomy with sentinel lymph node sampling  03/16/2017 showed a pT1b pN0, stage IA invasive ductal carcinoma, grade 1, with negative margins (2) adjuvant radiation not anticipated  (2) adjuvant radiation not felt to be needed if patient takes antiestrogen for 5 years  (3) Tamoxifen started 04/10/2017  (a) status post remote hysterectomy with bilateral salpingo-oophorectomy  (b) bone density at [facility] 03/30/2017 shows a T score of -1.0   PLAN: Lenah is now a little over 3 years out from definitive surgery for her breast cancer with no evidence of disease recurrence. This is very favorable.  She is tolerating tamoxifen with essentially no side effects and she obtains it at a very good price. The plan is to continue that for a total of 5 years.  I did encourage her to start a walking program. Even 15 minutes a day would make an enormous difference. Her goal is to be able to go up the 2 sets of 7 steps she has at home without having to stop.  Total encounter time 50 minutes.*   Eythan Jayne, [name], MD  05/28/20 11:14 AM Medical Oncology and Hematology [facility] [place], [place] [address] Tel. [phone]    Fax. [phone]    I, Wilburn Mylar, am acting as scribe for Dr. [name]. Leonor Darnell.  I, Lurline Del MD, have reviewed the above documentation for accuracy and  completeness, and I agree with the above.   *Total Encounter Time as defined by the Centers for Medicare and Medicaid Services includes, in addition to the face-to-face time of a patient visit (documented in the note above) non-face-to-face time: obtaining and reviewing outside history, ordering and reviewing medications, tests or procedures, care coordination (communications with other health care professionals or caregivers) and documentation in the medical record.

## 2020-05-28 ENCOUNTER — Inpatient Hospital Stay (HOSPITAL_BASED_OUTPATIENT_CLINIC_OR_DEPARTMENT_OTHER): Payer: MEDICARE | Admitting: Oncology

## 2020-05-28 ENCOUNTER — Other Ambulatory Visit: Payer: Self-pay

## 2020-05-28 ENCOUNTER — Inpatient Hospital Stay: Payer: MEDICARE | Attending: Oncology

## 2020-05-28 ENCOUNTER — Telehealth: Payer: Self-pay | Admitting: Oncology

## 2020-05-28 VITALS — BP 161/76 | HR 105 | Temp 98.1°F | Resp 18 | Ht 62.0 in | Wt 184.6 lb

## 2020-05-28 DIAGNOSIS — C50411 Malignant neoplasm of upper-outer quadrant of right female breast: Secondary | ICD-10-CM

## 2020-05-28 DIAGNOSIS — Z170 Estrogen receptor positive status [ER+]: Secondary | ICD-10-CM

## 2020-05-28 DIAGNOSIS — Z79810 Long term (current) use of selective estrogen receptor modulators (SERMs): Secondary | ICD-10-CM | POA: Insufficient documentation

## 2020-05-28 DIAGNOSIS — I200 Unstable angina: Secondary | ICD-10-CM | POA: Diagnosis not present

## 2020-05-28 LAB — CMP (CANCER CENTER ONLY)
ALT: 31 U/L (ref 0–44)
AST: 25 U/L (ref 15–41)
Albumin: 3.7 g/dL (ref 3.5–5.0)
Alkaline Phosphatase: 48 U/L (ref 38–126)
Anion gap: 12 (ref 5–15)
BUN: 17 mg/dL (ref 8–23)
CO2: 24 mmol/L (ref 22–32)
Calcium: 10.1 mg/dL (ref 8.9–10.3)
Chloride: 104 mmol/L (ref 98–111)
Creatinine: 1.24 mg/dL — ABNORMAL HIGH (ref 0.44–1.00)
GFR, Estimated: 45 mL/min — ABNORMAL LOW (ref >60)
Glucose, Bld: 107 mg/dL — ABNORMAL HIGH (ref 70–99)
Potassium: 3.3 mmol/L — ABNORMAL LOW (ref 3.5–5.1)
Sodium: 140 mmol/L (ref 135–145)
Total Bilirubin: 0.4 mg/dL (ref 0.3–1.2)
Total Protein: 7.2 g/dL (ref 6.5–8.1)

## 2020-05-28 LAB — CBC WITH DIFFERENTIAL (CANCER CENTER ONLY)
Abs Immature Granulocytes: 0.04 10*3/uL (ref 0.00–0.07)
Basophils Absolute: 0.1 10*3/uL (ref 0.0–0.1)
Basophils Relative: 1 %
Eosinophils Absolute: 0.2 10*3/uL (ref 0.0–0.5)
Eosinophils Relative: 2 %
HCT: 41.1 % (ref 36.0–46.0)
Hemoglobin: 13.5 g/dL (ref 12.0–15.0)
Immature Granulocytes: 0 %
Lymphocytes Relative: 19 %
Lymphs Abs: 1.9 10*3/uL (ref 0.7–4.0)
MCH: 30.1 pg (ref 26.0–34.0)
MCHC: 32.8 g/dL (ref 30.0–36.0)
MCV: 91.7 fL (ref 80.0–100.0)
Monocytes Absolute: 1.1 10*3/uL — ABNORMAL HIGH (ref 0.1–1.0)
Monocytes Relative: 11 %
Neutro Abs: 6.7 10*3/uL (ref 1.7–7.7)
Neutrophils Relative %: 67 %
Platelet Count: 236 10*3/uL (ref 150–400)
RBC: 4.48 MIL/uL (ref 3.87–5.11)
RDW: 12.1 % (ref 11.5–15.5)
WBC Count: 9.9 10*3/uL (ref 4.0–10.5)
nRBC: 0 % (ref 0.0–0.2)

## 2020-05-28 MED ORDER — TAMOXIFEN CITRATE 20 MG PO TABS: 20.0000 mg | ORAL_TABLET | Freq: Every day | ORAL | 4 refills | Status: DC

## 2020-05-28 NOTE — Telephone Encounter (Signed)
Scheduled appts per 11/29 los. Gave pt a print out of AVS.  

## 2020-08-06 ENCOUNTER — Telehealth: Payer: Self-pay

## 2020-08-06 NOTE — Telephone Encounter (Signed)
Received referral from Dr. Baird Lyons at [facility] to setup new PCP with [facility]. LM for pt to call and schedule new pt appt.

## 2020-08-21 NOTE — Telephone Encounter (Signed)
lvm for pt to call and schedule new pt visit

## 2021-01-01 ENCOUNTER — Telehealth: Payer: Self-pay | Admitting: Cardiology

## 2021-01-01 NOTE — Telephone Encounter (Signed)
Attempted to call pt 3 times. Phone would pick up then immediately hang up. Will forward to RN for follow up.

## 2021-01-01 NOTE — Telephone Encounter (Signed)
Patient c/o Palpitations:  High priority if patient c/o lightheadedness, shortness of breath, or chest pain  How long have you had palpitations/irregular HR/ Afib? Are you having the symptoms now? Not at time of call. Pt had some sx this morning   Are you currently experiencing lightheadedness, SOB or CP? Chest Pain  Do you have a history of afib (atrial fibrillation) or irregular heart rhythm? Pt was diagnosed by her PCP 12/26/20 with Afib.   Have you checked your BP or HR? (document readings if available):  12/29/20: HR 118  Are you experiencing any other symptoms? fatigue   Pt c/o of Chest Pain: STAT if CP now or developed within 24 hours  1. Are you having CP right now? Not now. Pt had some this morning  2. Are you experiencing any other symptoms (ex. SOB, nausea, vomiting, sweating)? SOB, nausea, blurry vision, headaches  3. How long have you been experiencing CP? A couple weeks   4. Is your CP continuous or coming and going? Comes and Goes   5. Have you taken Nitroglycerin? Yes. Pt took a nitro this morning    Patient was diagnosed with Afib by her PCP 12/25/20. The patient was told to get an appt to see Dr. Percival Spanish ASAP

## 2021-01-02 ENCOUNTER — Telehealth: Payer: Self-pay

## 2021-01-02 NOTE — Telephone Encounter (Signed)
NOTES ON FILE FROM [facility] [phone] SENT REFERRAL TO SCHEDULING

## 2021-01-03 DIAGNOSIS — R002 Palpitations: Secondary | ICD-10-CM | POA: Insufficient documentation

## 2021-01-03 NOTE — Telephone Encounter (Signed)
Patient scheduled for visit tomorrow morning

## 2021-01-03 NOTE — Telephone Encounter (Signed)
Unable to reach [place], left message on patients phone to call back

## 2021-01-03 NOTE — Progress Notes (Signed)
Cardiology Office Note   Date:  01/06/2021   ID:  Brenda Stephens, Brenda Stephens [birth year], MRN [id number]  PCP:  Jola Baptist, PA-C  Cardiologist:   [name], MD   Chief Complaint  Patient presents with   Palpitations      History of Present Illness: Brenda Stephens is a 80 y.o. female who presents for evaluation of chest pain.  I saw her for palpitations in 2017 and leg swelling in 2019.  She had chest pain and had normal coronaries in 2021.  She called recently complaining of palpitations.    She says she was told by her primary provider that she was in atrial fibrillation.  She has felt flipping intermittently in her chest.  This has been going on for couple of weeks.  She says its not all the time.  When she does have it she feels some shortness of breath.  There might be some lightheadedness.  She does not think she has had this before.  There is been no frank syncope.  She has not had any chest pressure, neck or arm discomfort.  He is not describing new PND or orthopnea.  She has had no weight gain or edema.  However, today her EKG does not show atrial fibrillation but sinus rhythm with PACs.   Past Medical History:  Diagnosis Date   Allergy    latex   Anemia    Anxiety    Arthritis    Cancer ([place]) 01/2017   right breast cancer   Depression    Dyslipidemia    Eustachian tube dysfunction    GERD (gastroesophageal reflux disease)    Hx of cardiovascular stress test    Myoview 10/13:  apical thinning, EF 72%, no ischemia   Hx of colonic polyps    Dr Watt Climes   Hypertension    Hypothyroidism    Other abnormal glucose     Past Surgical History:  Procedure Laterality Date   ABDOMINAL HYSTERECTOMY     BSO for uterine fibroids   ACHILLES TENDON SURGERY Right 10/07/2012   Procedure: RIGHT REPAIR RUPTURE TENDON PRIMARY OPEN/PERCUTANEOUS ;  Surgeon: Ninetta Lights, MD;  Location: [facility];  Service: Orthopedics;  Laterality: Right;   APPENDECTOMY      BREAST LUMPECTOMY WITH RADIOACTIVE SEED AND SENTINEL LYMPH NODE BIOPSY Right 03/16/2017   Procedure: RIGHT BREAST LUMPECTOMY WITH RADIOACTIVE SEED AND RIGHT AXILLARY SENTINEL  NODE BIOPSY ERAS PATHWAY;  Surgeon: [name], MD;  Location: [facility];  Service: General;  Laterality: Right;  PECTORAL BLOCK   CATARACT EXTRACTION, BILATERAL     CHOLECYSTECTOMY     COLONOSCOPY W/ POLYPECTOMY     Dr Watt Climes   EAR CYST EXCISION Right 10/07/2012   Procedure: EXCISION BONE CYST BENIGN TUMOR [name] ;  Surgeon: Ninetta Lights, MD;  Location: [facility];  Service: Orthopedics;  Laterality: Right;   EXPLORATORY LAPAROTOMY     LEFT HEART CATH AND CORONARY ANGIOGRAPHY N/A 08/02/2019   Procedure: LEFT HEART CATH AND CORONARY ANGIOGRAPHY;  Surgeon: Martinique, Peter M, MD;  Location: [facility];  Service: Cardiovascular;  Laterality: N/A;   REVISION TOTAL SHOULDER TO REVERSE TOTAL SHOULDER Left 01/12/2019   Procedure: REVISION TOTAL SHOULDER TO REVERSE TOTAL SHOULDER;  Surgeon: Hiram Gash, MD;  Location: [facility];  Service: Orthopedics;  Laterality: Left;   SINUS SURGERY WITH INSTATRAK     TOTAL SHOULDER REPLACEMENT     Right  shoulder   TOTAL SHOULDER REPLACEMENT  05/2010   Left   TUBAL LIGATION      Current Outpatient Medications  Medication Sig Dispense Refill   acetaminophen (TYLENOL) 500 MG tablet Take 1,000 mg by mouth 2 (two) times daily as needed for moderate pain.     atorvastatin (LIPITOR) 20 MG tablet TAKE 1 TABLET BY MOUTH AT BEDTIME (Patient taking differently: Take 20 mg by mouth at bedtime.) 90 tablet 1   donepezil (ARICEPT) 5 MG tablet Take 5 mg by mouth at bedtime.     esomeprazole (NEXIUM) 20 MG capsule Take 20 mg by mouth at bedtime.     fluticasone (FLONASE) 50 MCG/ACT nasal spray Place 2 sprays into both nostrils daily as needed for allergies or rhinitis.      halobetasol (ULTRAVATE) 0.05 % cream Apply topically daily. 50 g 3   metoprolol  succinate (TOPROL-XL) 50 MG 24 hr tablet Take 1 tablet (50 mg total) by mouth daily. 90 tablet 3   nystatin (MYCOSTATIN/NYSTOP) powder Apply 1 application topically 2 (two) times daily. 15 g 3   ondansetron (ZOFRAN) 4 MG tablet Take 4 mg by mouth every 8 (eight) hours as needed for nausea or vomiting.      Phenylephrine HCl (SUDAFED PE CHILDRENS) 2.5 MG/5ML SOLN Take 5 mLs by mouth daily as needed (congestion).      sennosides-docusate sodium (SENOKOT-S) 8.6-50 MG tablet Take 2 tablets by mouth daily as needed for constipation.     spironolactone-hydrochlorothiazide (ALDACTAZIDE) 25-25 MG per tablet Take 1 tablet by mouth daily. 90 tablet 3   SYNTHROID 50 MCG tablet PT NEEDS COMPLETE PHYSICAL.  TAKE 1 TABLET (50MCG TOTAL) BY MOUTH DAILY. (Patient taking differently: Take 50 mcg by mouth daily.) 30 tablet 0   tamoxifen (NOLVADEX) 20 MG tablet Take 1 tablet (20 mg total) by mouth daily. 90 tablet 4   venlafaxine XR (EFFEXOR-XR) 150 MG 24 hr capsule TAKE 1 CAPSULE (150 MG TOTAL) BY MOUTH DAILY. (Patient taking differently: Take 150 mg by mouth daily.) 90 capsule 2   verapamil (CALAN-SR) 240 MG CR tablet TAKE 1 TABLET (240 MG TOTAL) BY MOUTH AT BEDTIME. (Patient taking differently: Take 240 mg by mouth at bedtime.) 30 tablet 0   zolpidem (AMBIEN) 5 MG tablet Take 5 mg by mouth at bedtime.      nitroGLYCERIN (NITROSTAT) 0.4 MG SL tablet Place 1 tablet (0.4 mg total) under the tongue every 5 (five) minutes as needed for chest pain. 25 tablet 3   No current facility-administered medications for this visit.    Allergies:   Latex, Aspirin, Oxycodone-acetaminophen, Sulfa antibiotics, and Sulfamethoxazole-trimethoprim    ROS:  Please see the history of present illness.   Otherwise, review of systems are positive for none.   All other systems are reviewed and negative.    PHYSICAL EXAM: VS:  There were no vitals taken for this visit. , BMI There is no height or weight on file to calculate BMI.  GENERAL:   Well appearing NECK:  No jugular venous distention, waveform within normal limits, carotid upstroke brisk and symmetric, no bruits, no thyromegaly LUNGS:  Clear to auscultation bilaterally CHEST:  Unremarkable HEART:  PMI not displaced or sustained,S1 and S2 within normal limits, no S3, no S4, no clicks, no rubs, no murmurs ABD:  Flat, positive bowel sounds normal in frequency in pitch, no bruits, no rebound, no guarding, no midline pulsatile mass, no hepatomegaly, no splenomegaly EXT:  2 plus pulses throughout, no edema, no cyanosis  no clubbing   EKG:  EKG is  ordered today. Sinus rhythm, rate 88, axis within normal limits, early transition lead V2, low voltage in the chest leads, QT prolonged, premature atrial contractions.  PACs.    Recent Labs: 05/28/2020: ALT 31; BUN 17; Creatinine 1.24; Hemoglobin 13.5; Platelet Count 236; Potassium 3.3; Sodium 140    Lipid Panel    Component Value Date/Time   CHOL 208 (H) 07/19/2012 1520   TRIG 152.0 (H) 07/19/2012 1520   HDL 70.30 07/19/2012 1520   CHOLHDL 3 07/19/2012 1520   VLDL 30.4 07/19/2012 1520   LDLCALC 91 06/13/2011 1134   LDLDIRECT 108.4 07/19/2012 1520      Wt Readings from Last 3 Encounters:  05/28/20 184 lb 9.6 oz (83.7 kg)  05/09/20 185 lb 9.6 oz (84.2 kg)  04/24/20 187 lb (84.8 kg)      Other studies Reviewed: Additional studies/ records that were reviewed today include: Labs Review of the above records demonstrates: See elsewhere   ASSESSMENT AND PLAN:   PALPITATIONS:   Her EKG today interpreted by the computer as atrial fibrillation was actually sinus rhythm with premature atrial contractions.  There were premature ventricular contractions as well.  I am going to apply a 2-week monitor.  I am going to get the EKG from her primary provider to see what they were looking at the other day.  I suspect this is PACs.  There is no indication for anticoagulation.  I do see labs that were normal including a TSH.  Further  evaluation will be based on the monitor results.   HTN:     The blood pressure is at target. No change in medications is indicated. We will continue with therapeutic lifestyle changes (TLC).     Current medicines are reviewed at length with the patient today.  The patient does not have concerns regarding medicines.  The following changes have been made:  None  Labs/ tests ordered today include:   Orders Placed This Encounter  Procedures   Cardiac event monitor   EKG 12-Lead      Disposition:   FU with me as needed based on the results of the above.   Signed, [name], MD  01/06/2021 10:54 AM    [facility]

## 2021-01-03 NOTE — Telephone Encounter (Signed)
Patient's daughter returning call. 

## 2021-01-04 ENCOUNTER — Ambulatory Visit (INDEPENDENT_AMBULATORY_CARE_PROVIDER_SITE_OTHER): Payer: MEDICARE | Admitting: Cardiology

## 2021-01-04 ENCOUNTER — Encounter: Payer: Self-pay | Admitting: Radiology

## 2021-01-04 ENCOUNTER — Encounter: Payer: Self-pay | Admitting: Cardiology

## 2021-01-04 ENCOUNTER — Telehealth: Payer: Self-pay | Admitting: Cardiology

## 2021-01-04 ENCOUNTER — Other Ambulatory Visit: Payer: Self-pay

## 2021-01-04 DIAGNOSIS — I10 Essential (primary) hypertension: Secondary | ICD-10-CM

## 2021-01-04 DIAGNOSIS — R002 Palpitations: Secondary | ICD-10-CM

## 2021-01-04 DIAGNOSIS — E785 Hyperlipidemia, unspecified: Secondary | ICD-10-CM | POA: Diagnosis not present

## 2021-01-04 NOTE — Telephone Encounter (Signed)
Left message to call back  Patient was to check blood pressure at home today and call in with reading Echo ordered by PCP

## 2021-01-04 NOTE — Telephone Encounter (Signed)
Pt is wanting to know if Dr. Percival Spanish would still like for her to be scheduled for an echocardiogram... please advise

## 2021-01-04 NOTE — Patient Instructions (Signed)
Medication Instructions:  Your physician recommends that you continue on your current medications as directed. Please refer to the Current Medication list given to you today.   Labwork: NONE   Testing/Procedures: Your physician has recommended that you wear an event monitor. Event monitors are medical devices that record the heart's electrical activity. Doctors most often [place] these monitors to diagnose arrhythmias. Arrhythmias are problems with the speed or rhythm of the heartbeat. The monitor is a small, portable device. You can wear one while you do your normal daily activities. This is usually used to diagnose what is causing palpitations/syncope (passing out). 14 DAYS  Follow-Up: AS NEEDED   Any Other Special Instructions Will Be Listed Below (If Applicable).  [employer] Cardiac Event Monitor Instructions Your physician has requested you wear your cardiac event monitor for _14_ days, (1-30). [employer] may call or text to confirm a shipping address. The monitor will be sent to a land address via UPS. [employer] will not ship a monitor to a PO BOX. It typically takes 3-5 days to receive your monitor after it has been enrolled. [employer] will assist with USPS tracking if your package is delayed. The telephone number for [employer] is [phone]. Once you have received your monitor, please review the enclosed instructions. Instruction tutorials can also be viewed under help and settings on the enclosed cell phone. Your monitor has already been registered assigning a specific monitor serial # to you.  Applying the monitor Remove cell phone from case and turn it on. The cell phone works as Dealer and needs to be within [employer] of you at all times. The cell phone will need to be charged on a daily basis. We recommend you plug the cell phone into the enclosed charger at your bedside table every night.  Monitor batteries: You will receive two monitor batteries labelled #1 and  #2. These are your recorders. Plug battery #2 onto the second connection on the enclosed charger. Keep one battery on the charger at all times. This will keep the monitor battery deactivated. It will also keep it fully charged for when you need to switch your monitor batteries. A small light will be blinking on the battery emblem when it is charging. The light on the battery emblem will remain on when the battery is fully charged.  Open package of a Monitor strip. Insert battery #1 into black hood on strip and gently squeeze monitor battery onto connection as indicated in instruction booklet. Set aside while preparing skin.  Choose location for your strip, vertical or horizontal, as indicated in the instruction booklet. Shave to remove all hair from location. There cannot be any lotions, oils, powders, or colognes on skin where monitor is to be applied. Wipe skin clean with enclosed Saline wipe. Dry skin completely.  Peel paper labeled #1 off the back of the Monitor strip exposing the adhesive. Place the monitor on the chest in the vertical or horizontal position shown in the instruction booklet. One arrow on the monitor strip must be pointing upward. Carefully remove paper labeled #2, attaching remainder of strip to your skin. Try not to create any folds or wrinkles in the strip as you apply it.  Firmly press and release the circle in the center of the monitor battery. You will hear a small beep. This is turning the monitor battery on. The heart emblem on the monitor battery will light up every 5 seconds if the monitor battery in turned on and connected to the patient securely. Do  not push and hold the circle down as this turns the monitor battery off. The cell phone will locate the monitor battery. A screen will appear on the cell phone checking the connection of your monitor strip. This may read poor connection initially but change to good connection within the next minute. Once your  monitor accepts the connection you will hear a series of 3 beeps followed by a climbing crescendo of beeps. A screen will appear on the cell phone showing the two monitor strip placement options. Touch the picture that demonstrates where you applied the monitor strip.  Your monitor strip and battery are waterproof. You are able to shower, bathe, or swim with the monitor on. They just ask you do not submerge deeper than 3 feet underwater. We recommend removing the monitor if you are swimming in a lake, river, or ocean.  Your monitor battery will need to be switched to a fully charged monitor battery approximately once a week. The cell phone will alert you of an action which needs to be made.  On the cell phone, tap for details to reveal connection status, monitor battery status, and cell phone battery status. The green dots indicates your monitor is in good status. A red dot indicates there is something that needs your attention.  To record a symptom, click the circle on the monitor battery. In 30-60 seconds a list of symptoms will appear on the cell phone. Select your symptom and tap save. Your monitor will record a sustained or significant arrhythmia regardless of you clicking the button. Some patients do not feel the heart rhythm irregularities. [employer] will notify us of any serious or critical events.  Refer to instruction booklet for instructions on switching batteries, changing strips, the Do not disturb or Pause features, or any additional questions.  Call [employer] at [phone], to confirm your monitor is transmitting and record your baseline. They will answer any questions you may have regarding the monitor instructions at that time.  Returning the monitor to [place] all equipment back into blue box. Peel off strip of paper to expose adhesive and close box securely. There is a prepaid UPS shipping label on this box. Drop in a UPS drop box, or at a UPS facility  like [employer]. You may also contact [employer] to arrange UPS to pick up monitor package at your home.

## 2021-01-04 NOTE — Progress Notes (Signed)
Enrolled patient for a 14 day [employer] Event Monitor to be mailed to patients home.  

## 2021-01-06 ENCOUNTER — Encounter: Payer: Self-pay | Admitting: Cardiology

## 2021-01-07 NOTE — Telephone Encounter (Signed)
Pt is returning call.  

## 2021-01-07 NOTE — Telephone Encounter (Signed)
Patient did not get her blood pressure, stated she forgot Advised to please get blood pressure and call the office Explained to patient PCP had wanted to get Echo per Dr Percival Spanish She would like to know if Dr Percival Spanish feels she should get the Echo  Will forward to Dr Percival Spanish for review

## 2021-01-09 NOTE — Telephone Encounter (Signed)
Left message to call back  

## 2021-01-13 ENCOUNTER — Ambulatory Visit (INDEPENDENT_AMBULATORY_CARE_PROVIDER_SITE_OTHER): Payer: MEDICARE

## 2021-01-13 DIAGNOSIS — E785 Hyperlipidemia, unspecified: Secondary | ICD-10-CM | POA: Diagnosis not present

## 2021-01-13 DIAGNOSIS — I10 Essential (primary) hypertension: Secondary | ICD-10-CM

## 2021-01-13 DIAGNOSIS — R002 Palpitations: Secondary | ICD-10-CM

## 2021-01-30 NOTE — Telephone Encounter (Signed)
Patient never called back, called to follow up  Dr Hochrein's recommendation below reviewed with patient, verbalized understanding   January 07, 2021  [name], MD to Me      6:27 PM I don't need an echo but I don't want to cancel something the PCP ordered.  Sheshould talk to them

## 2021-02-08 ENCOUNTER — Telehealth: Payer: Self-pay | Admitting: Internal Medicine

## 2021-02-08 ENCOUNTER — Ambulatory Visit: Payer: MEDICARE

## 2021-02-08 NOTE — Telephone Encounter (Signed)
Checked and pt's appt was cancelled. Nothing further needed.

## 2021-02-08 NOTE — Telephone Encounter (Signed)
Pt is scheduled to have a 6 min walk test performed today 02/08/21 at 2pm.  Pt's last office visit with Dr. Annamaria Boots was 05/09/20 and pt is not scheduled to have another follow up until 04/24/21.  Due to it being more than 30 days since pt has been seen and also since her upcoming appt is more than 30 days away, we should cancel pt's 6 min walk test and get her rescheduled to have the walk done closer to her appt.  If pt were to get qualified for oxygen, we would not be able to send in an order for pt until pt had an OV due to insurance requiring an office visit to be at least 30 days from the time that order would be placed.   Attempted to call pt to further discuss this with her and to get her 6 min walk appt cancelled and to have a recall placed right before her upcoming appt with Dr. Annamaria Boots in October but unable to reach. Left message for pt to return our call.  Since this needs to be handled today before pt's appt this afternoon, I am routing this to front desk pool as high priority to try to help me reach pt.

## 2021-02-10 NOTE — Progress Notes (Deleted)
Cardiology Office Note   Date:  02/10/2021   ID:  Brenda, Stephens 1941-03-15, MRN [id number]  PCP:  Jola Baptist, PA-C  Cardiologist:   [name], MD   No chief complaint on file.     History of Present Illness: Brenda Stephens is a 80 y.o. female who presents for evaluation of chest pain.  I saw her for palpitations in 2017 and leg swelling in 2019.  She had chest pain and had normal coronaries in 2021.  She has had palpitations.   She wore a monitor and had no atrial fib which had been suspected from her EKG.  She has PACs .  ***   *** She says she was told by her primary provider that she was in atrial fibrillation.  She has felt flipping intermittently in her chest.  This has been going on for couple of weeks.  She says its not all the time.  When she does have it she feels some shortness of breath.  There might be some lightheadedness.  She does not think she has had this before.  There is been no frank syncope.  She has not had any chest pressure, neck or arm discomfort.  He is not describing new PND or orthopnea.  She has had no weight gain or edema.  However, today her EKG does not show atrial fibrillation but sinus rhythm with PACs.   Past Medical History:  Diagnosis Date   Allergy    latex   Anemia    Anxiety    Arthritis    Cancer ([place]) 01/2017   right breast cancer   Depression    Dyslipidemia    Eustachian tube dysfunction    GERD (gastroesophageal reflux disease)    Hx of cardiovascular stress test    Myoview 10/13:  apical thinning, EF 72%, no ischemia   Hx of colonic polyps    Dr Watt Climes   Hypertension    Hypothyroidism    Other abnormal glucose     Past Surgical History:  Procedure Laterality Date   ABDOMINAL HYSTERECTOMY     BSO for uterine fibroids   ACHILLES TENDON SURGERY Right 10/07/2012   Procedure: RIGHT REPAIR RUPTURE TENDON PRIMARY OPEN/PERCUTANEOUS ;  Surgeon: Ninetta Lights, MD;  Location: [place];  Service:  Orthopedics;  Laterality: Right;   APPENDECTOMY     BREAST LUMPECTOMY WITH RADIOACTIVE SEED AND SENTINEL LYMPH NODE BIOPSY Right 03/16/2017   Procedure: RIGHT BREAST LUMPECTOMY WITH RADIOACTIVE SEED AND RIGHT AXILLARY SENTINEL  NODE BIOPSY ERAS PATHWAY;  Surgeon: [name], MD;  Location: [place];  Service: General;  Laterality: Right;  PECTORAL BLOCK   CATARACT EXTRACTION, BILATERAL     CHOLECYSTECTOMY     COLONOSCOPY W/ POLYPECTOMY     Dr Watt Climes   EAR CYST EXCISION Right 10/07/2012   Procedure: EXCISION BONE CYST BENIGN TUMOR [name] ;  Surgeon: Ninetta Lights, MD;  Location: [place];  Service: Orthopedics;  Laterality: Right;   EXPLORATORY LAPAROTOMY     LEFT HEART CATH AND CORONARY ANGIOGRAPHY N/A 08/02/2019   Procedure: LEFT HEART CATH AND CORONARY ANGIOGRAPHY;  Surgeon: Martinique, Peter M, MD;  Location: [facility];  Service: Cardiovascular;  Laterality: N/A;   REVISION TOTAL SHOULDER TO REVERSE TOTAL SHOULDER Left 01/12/2019   Procedure: REVISION TOTAL SHOULDER TO REVERSE TOTAL SHOULDER;  Surgeon: Hiram Gash, MD;  Location: [facility];  Service: Orthopedics;  Laterality: Left;  SINUS SURGERY WITH INSTATRAK     TOTAL SHOULDER REPLACEMENT     Right shoulder   TOTAL SHOULDER REPLACEMENT  05/2010   Left   TUBAL LIGATION      Current Outpatient Medications  Medication Sig Dispense Refill   acetaminophen (TYLENOL) 500 MG tablet Take 1,000 mg by mouth 2 (two) times daily as needed for moderate pain.     atorvastatin (LIPITOR) 20 MG tablet TAKE 1 TABLET BY MOUTH AT BEDTIME (Patient taking differently: Take 20 mg by mouth at bedtime.) 90 tablet 1   donepezil (ARICEPT) 5 MG tablet Take 5 mg by mouth at bedtime.     esomeprazole (NEXIUM) 20 MG capsule Take 20 mg by mouth at bedtime.     fluticasone (FLONASE) 50 MCG/ACT nasal spray Place 2 sprays into both nostrils daily as needed for allergies or rhinitis.      halobetasol (ULTRAVATE)  0.05 % cream Apply topically daily. 50 g 3   metoprolol succinate (TOPROL-XL) 50 MG 24 hr tablet Take 1 tablet (50 mg total) by mouth daily. 90 tablet 3   nitroGLYCERIN (NITROSTAT) 0.4 MG SL tablet Place 1 tablet (0.4 mg total) under the tongue every 5 (five) minutes as needed for chest pain. 25 tablet 3   nystatin (MYCOSTATIN/NYSTOP) powder Apply 1 application topically 2 (two) times daily. 15 g 3   ondansetron (ZOFRAN) 4 MG tablet Take 4 mg by mouth every 8 (eight) hours as needed for nausea or vomiting.      Phenylephrine HCl (SUDAFED PE CHILDRENS) 2.5 MG/5ML SOLN Take 5 mLs by mouth daily as needed (congestion).      sennosides-docusate sodium (SENOKOT-S) 8.6-50 MG tablet Take 2 tablets by mouth daily as needed for constipation.     spironolactone-hydrochlorothiazide (ALDACTAZIDE) 25-25 MG per tablet Take 1 tablet by mouth daily. 90 tablet 3   SYNTHROID 50 MCG tablet PT NEEDS COMPLETE PHYSICAL.  TAKE 1 TABLET (50MCG TOTAL) BY MOUTH DAILY. (Patient taking differently: Take 50 mcg by mouth daily.) 30 tablet 0   tamoxifen (NOLVADEX) 20 MG tablet Take 1 tablet (20 mg total) by mouth daily. 90 tablet 4   venlafaxine XR (EFFEXOR-XR) 150 MG 24 hr capsule TAKE 1 CAPSULE (150 MG TOTAL) BY MOUTH DAILY. (Patient taking differently: Take 150 mg by mouth daily.) 90 capsule 2   verapamil (CALAN-SR) 240 MG CR tablet TAKE 1 TABLET (240 MG TOTAL) BY MOUTH AT BEDTIME. (Patient taking differently: Take 240 mg by mouth at bedtime.) 30 tablet 0   zolpidem (AMBIEN) 5 MG tablet Take 5 mg by mouth at bedtime.      No current facility-administered medications for this visit.    Allergies:   Latex, Aspirin, Oxycodone-acetaminophen, Sulfa antibiotics, and Sulfamethoxazole-trimethoprim    ROS:  Please see the history of present illness.   Otherwise, review of systems are positive for ***.   All other systems are reviewed and negative.    PHYSICAL EXAM: VS:  There were no vitals taken for this visit. , BMI There is  no height or weight on file to calculate BMI.  GENERAL:  Well appearing NECK:  No jugular venous distention, waveform within normal limits, carotid upstroke brisk and symmetric, no bruits, no thyromegaly LUNGS:  Clear to auscultation bilaterally CHEST:  Unremarkable HEART:  PMI not displaced or sustained,S1 and S2 within normal limits, no S3, no S4, no clicks, no rubs, *** murmurs ABD:  Flat, positive bowel sounds normal in frequency in pitch, no bruits, no rebound, no guarding, no midline  pulsatile mass, no hepatomegaly, no splenomegaly EXT:  2 plus pulses throughout, no edema, no cyanosis no clubbing    ***GENERAL:  Well appearing NECK:  No jugular venous distention, waveform within normal limits, carotid upstroke brisk and symmetric, no bruits, no thyromegaly LUNGS:  Clear to auscultation bilaterally CHEST:  Unremarkable HEART:  PMI not displaced or sustained,S1 and S2 within normal limits, no S3, no S4, no clicks, no rubs, no murmurs ABD:  Flat, positive bowel sounds normal in frequency in pitch, no bruits, no rebound, no guarding, no midline pulsatile mass, no hepatomegaly, no splenomegaly EXT:  2 plus pulses throughout, no edema, no cyanosis no clubbing   EKG:  EKG is *** ordered today. Sinus rhythm, rate ***, axis within normal limits, early transition lead V2, low voltage in the chest leads, QT prolonged, premature atrial contractions.  PACs.    Recent Labs: 05/28/2020: ALT 31; BUN 17; Creatinine 1.24; Hemoglobin 13.5; Platelet Count 236; Potassium 3.3; Sodium 140    Lipid Panel    Component Value Date/Time   CHOL 208 (H) 07/19/2012 1520   TRIG 152.0 (H) 07/19/2012 1520   HDL 70.30 07/19/2012 1520   CHOLHDL 3 07/19/2012 1520   VLDL 30.4 07/19/2012 1520   LDLCALC 91 06/13/2011 1134   LDLDIRECT 108.4 07/19/2012 1520      Wt Readings from Last 3 Encounters:  05/28/20 184 lb 9.6 oz (83.7 kg)  05/09/20 185 lb 9.6 oz (84.2 kg)  04/24/20 187 lb (84.8 kg)      Other  studies Reviewed: Additional studies/ records that were reviewed today include: *** Review of the above records demonstrates: See elsewhere   ASSESSMENT AND PLAN:   PALPITATIONS:   ***  Her EKG today interpreted by the computer as atrial fibrillation was actually sinus rhythm with premature atrial contractions.  There were premature ventricular contractions as well.  I am going to apply a 2-week monitor.  I am going to get the EKG from her primary provider to see what they were looking at the other day.  I suspect this is PACs.  There is no indication for anticoagulation.  I do see labs that were normal including a TSH.  Further evaluation will be based on the monitor results.   HTN:     The blood pressure is *** at target. No change in medications is indicated. We will continue with therapeutic lifestyle changes (TLC).     Current medicines are reviewed at length with the patient today.  The patient does not have concerns regarding medicines.  The following changes have been made:  None  Labs/ tests ordered today include:   No orders of the defined types were placed in this encounter.     Disposition:   FU with me as needed based on the results of the above.   Signed, [name], MD  02/10/2021 8:51 PM    [facility]

## 2021-02-11 ENCOUNTER — Ambulatory Visit: Payer: MEDICARE | Admitting: Cardiology

## 2021-02-11 DIAGNOSIS — R002 Palpitations: Secondary | ICD-10-CM

## 2021-03-28 ENCOUNTER — Other Ambulatory Visit: Payer: Self-pay

## 2021-03-28 ENCOUNTER — Ambulatory Visit (INDEPENDENT_AMBULATORY_CARE_PROVIDER_SITE_OTHER): Payer: MEDICARE

## 2021-03-28 DIAGNOSIS — R0600 Dyspnea, unspecified: Secondary | ICD-10-CM | POA: Diagnosis not present

## 2021-03-28 DIAGNOSIS — R0609 Other forms of dyspnea: Secondary | ICD-10-CM

## 2021-03-28 NOTE — Progress Notes (Signed)
Six Minute Walk - 03/28/21 1418       Six Minute Walk   Medications taken before test (dose and time) Aricept, Nexium, Flonase, synthroid, toprol-XL, Tamoxifen, aldactazide all at 10am    Supplemental oxygen during test? No    Lap distance in meters  34 meters    Laps Completed 9    Partial lap (in meters) 0 meters    Baseline BP (sitting) 120/72    Baseline Dyspnea (Borg Scale) 4    Baseline Fatigue (Borg Scale) 3    Baseline SPO2 97 %      Interval Oxygen Saturation and HR    2 Minute Oxygen Saturation % 95 %    2 Minute HR 67      End of Test Values    BP (sitting) 140/82    Heartrate 103    Dyspnea (Borg Scale) 5    Fatigue (Borg Scale) 5    SPO2 97 %      2 Minutes Post Walk Values   BP (sitting) 132/80    Heartrate 67    SPO2 95 %    Stopped or paused before six minutes? Yes    Reason: stopped 3 times due to SOB      Interpretation   Distance completed 306 meters    Tech Comments: patient kept an average pace

## 2021-04-22 ENCOUNTER — Other Ambulatory Visit: Payer: Self-pay | Admitting: *Deleted

## 2021-04-22 DIAGNOSIS — R0609 Other forms of dyspnea: Secondary | ICD-10-CM

## 2021-04-23 NOTE — Progress Notes (Signed)
Subjective:    Patient ID: Brenda Stephens, female    DOB: [birth year], 80 y.o.   MRN: [id number]  HPI F former smoker followed for allergic rhinitis, Bronchitis, complicated by HBP, GERD, Anemia, R breast cancer Walk Test 10/12/19- desat to 89% with max HR 95. Mostly sats around 94-96.  ------------------------------------------------------------------------------------  05/09/20- 79 yoF former smoker followed for allergic rhinitis, acute bronchitis,  complicated by HBP, GERD, Anemia, R breast cancer Covid vax-2 Phizer Flu vax-had Was scheduled today for PFT but could not perform. Per tech, pt was unable to sustain exhalation long enough. Here today with daughter. C/O weakness, easy DOE, "no energy". Now completing course doxycycline [facility] for cough and malaise treating presumptive  Bronchitis/ pneumonia per pt, but was told CXR didn't show pneumonia. Chest feels better, little cough now. No longer wheeze. Using albuterol hfa but cant tell it helps. Always mild ankle edema- not worse, no calf pain, chest pain, blood or purulent.  Current PCP is too far away- daughter asks help finding someone closer.   04/24/21-  80 yoF former smoker (3 pk yrs)followed for allergic rhinitis, acute bronchitis,  complicated by HBP, GERD, Anemia, R breast cancer Covid vax-2 Phizer Flu vax-had - no inhalers 6MWT 03/28/21- 306 meters, average pace, stopping x3 for SOB, lowest O2 sat 95%, max HR 103. Cardiac cath 08/02/19- normal PFT10/26/22- she was again unable to follow directions- when to inhale and when to blow out- tech unable to complete test.  Hgb 12.7 9/13 [facility] Notes some wheeze most nights as she gets ready for bed, before lying down. Can't tell any benefit from albuterol rescue inhaler for her sense of some dyspnea on exertion. Little cough, no phlegm, chest pain or palpitation. Dr Percival Spanish has seen for cardiology.  ROS-see HPI   + = positive Constitutional:   No-   weight loss, night  sweats, fevers, chills, fatigue, lassitude. HEENT:   + headaches, difficulty swallowing, tooth/dental problems, sore throat,       No-  sneezing, itching, ear ache, nasal congestion, +post nasal drip,  CV:  No-   chest pain, orthopnea, PND, swelling in lower extremities, anasarca,  dizziness, palpitations Resp: + shortness of breath with exertion or at rest.               productive cough,  No non-productive cough,  No- coughing up of blood.              No-   change in color of mucus.  No- wheezing.   Skin: No-   rash or lesions. GI:  No-   heartburn, indigestion, abdominal pain, nausea, vomiting, GU:  MS:  No-   joint pain or swelling.  Neuro-     nothing unusual Psych:  No- change in mood or affect. No depression or anxiety.  No memory loss.    Objective:  OBJ- Physical Exam General- Alert, Oriented, Affect-appropriate, Distress- none acute, + overweight Skin- rash-none, lesions- none, excoriation- none Lymphadenopathy- none Head- atraumatic            Eyes- Gross vision intact, PERRLA, conjunctivae and secretions clear            Ears- Hearing ok,             Nose-, no-Septal dev,  polyps, erosion, perforation             Throat- Mallampati III , mucosa clear , drainage- none, tonsils- atrophic Neck- flexible , trachea midline, no stridor , thyroid  nl, carotid no bruit Chest - symmetrical excursion , unlabored           Heart/CV- RRR , no murmur , no gallop  , no rub, nl s1 s2                           - JVD- none , edema-none, stasis changes- none, varices- none           Lung- clear to P&A, wheeze- none, cough none, dullness-none, rub- none           Chest wall-  Abd- Br/ Gen/ Rectal- Not done, not indicated Extrem- cyanosis- none, clubbing, none, atrophy- none, strength- nl.  Neuro- +slight head bob Assessment & Plan:

## 2021-04-24 ENCOUNTER — Ambulatory Visit (INDEPENDENT_AMBULATORY_CARE_PROVIDER_SITE_OTHER): Payer: MEDICARE

## 2021-04-24 ENCOUNTER — Ambulatory Visit (INDEPENDENT_AMBULATORY_CARE_PROVIDER_SITE_OTHER): Payer: MEDICARE | Admitting: Internal Medicine

## 2021-04-24 ENCOUNTER — Other Ambulatory Visit: Payer: Self-pay

## 2021-04-24 ENCOUNTER — Encounter: Payer: Self-pay | Admitting: Internal Medicine

## 2021-04-24 VITALS — BP 148/80 | HR 100 | Temp 97.9°F | Ht 62.0 in | Wt 186.0 lb

## 2021-04-24 DIAGNOSIS — R0609 Other forms of dyspnea: Secondary | ICD-10-CM | POA: Diagnosis not present

## 2021-04-24 DIAGNOSIS — D508 Other iron deficiency anemias: Secondary | ICD-10-CM

## 2021-04-24 LAB — PULMONARY FUNCTION TEST
RV % pred: 97 %
RV: 2.2 L
TLC % pred: 98 %
TLC: 4.7 L

## 2021-04-24 MED ORDER — TRELEGY ELLIPTA 200-62.5-25 MCG/ACT IN AEPB: 1.0000 | INHALATION_SPRAY | Freq: Every day | RESPIRATORY_TRACT | 0 refills | Status: DC

## 2021-04-24 NOTE — Assessment & Plan Note (Signed)
Recognize potential to contribute to dyspnea. Recent Hgb 12.7 should be ok.

## 2021-04-24 NOTE — Progress Notes (Signed)
Lung volumes performed today. Patient was not able to complete spirometry or DLCO.

## 2021-04-24 NOTE — Patient Instructions (Signed)
Order- CXR   dx Dyspnea on Exertion  Order- sample x 1 Trelegy 100 inhaler     inhale 1 puff, then rinse mouth once daily See if this makes any difference with wheezing or shortness of breath. If it helps, we can call in a prescription.

## 2021-04-24 NOTE — Assessment & Plan Note (Signed)
Don't think we can define a pulmonary limitation. Her difficulty following PFT directions might possibly suggest some mild dementia for attention of her PCP, if appropriate. There is deconditioning and walking is recommended.  Plan- CXR, trial sample Trelegy (-200 was all we had available today). Should be simple for her to operate.

## 2021-04-24 NOTE — Patient Instructions (Signed)
Lung volumes performed today.

## 2021-05-02 NOTE — Progress Notes (Signed)
Cardiology Office Note   Date:  05/03/2021   ID:  Brenda, Stephens [birth year], MRN [id number]  PCP:  Jola Baptist, PA-C  Cardiologist:   [name], MD   Chief Complaint  Patient presents with   Palpitations       History of Present Illness: Brenda Stephens is a 80 y.o. female who presents for evaluation of chest pain.  I saw her for palpitations in 2017 and leg swelling in 2019.  She had chest pain and had normal coronaries in 2021.    She had.  There was a question of atrial fibrillation but I did not demonstrate this on a monitor.  Since I last saw her she occasionally gets some shooting discomfort up into her neck and down into her left arm.  This is a fleeting discomfort that happens infrequently.  She also gets occasional palpitations.  This happens every few days.  It is short-lived less than a minute.  She does not have any presyncope or syncope.  It happens at rest.  She cannot bring it on.  She does not have any new shortness of breath, PND or orthopnea.  Past Medical History:  Diagnosis Date   Allergy    latex   Anemia    Anxiety    Arthritis    Cancer ([place]) 01/2017   right breast cancer   Depression    Dyslipidemia    Eustachian tube dysfunction    GERD (gastroesophageal reflux disease)    Hx of cardiovascular stress test    Myoview 10/13:  apical thinning, EF 72%, no ischemia   Hx of colonic polyps    Dr Watt Climes   Hypertension    Hypothyroidism    Other abnormal glucose     Past Surgical History:  Procedure Laterality Date   ABDOMINAL HYSTERECTOMY     BSO for uterine fibroids   ACHILLES TENDON SURGERY Right 10/07/2012   Procedure: RIGHT REPAIR RUPTURE TENDON PRIMARY OPEN/PERCUTANEOUS ;  Surgeon: Ninetta Lights, MD;  Location: [place];  Service: Orthopedics;  Laterality: Right;   APPENDECTOMY     BREAST LUMPECTOMY WITH RADIOACTIVE SEED AND SENTINEL LYMPH NODE BIOPSY Right 03/16/2017   Procedure: RIGHT BREAST LUMPECTOMY WITH  RADIOACTIVE SEED AND RIGHT AXILLARY SENTINEL  NODE BIOPSY ERAS PATHWAY;  Surgeon: [name], MD;  Location: [place];  Service: General;  Laterality: Right;  PECTORAL BLOCK   CATARACT EXTRACTION, BILATERAL     CHOLECYSTECTOMY     COLONOSCOPY W/ POLYPECTOMY     Dr Watt Climes   EAR CYST EXCISION Right 10/07/2012   Procedure: EXCISION BONE CYST BENIGN TUMOR [name] ;  Surgeon: Ninetta Lights, MD;  Location: [place];  Service: Orthopedics;  Laterality: Right;   EXPLORATORY LAPAROTOMY     LEFT HEART CATH AND CORONARY ANGIOGRAPHY N/A 08/02/2019   Procedure: LEFT HEART CATH AND CORONARY ANGIOGRAPHY;  Surgeon: Martinique, Peter M, MD;  Location: [place] [facility];  Service: Cardiovascular;  Laterality: N/A;   REVISION TOTAL SHOULDER TO REVERSE TOTAL SHOULDER Left 01/12/2019   Procedure: REVISION TOTAL SHOULDER TO REVERSE TOTAL SHOULDER;  Surgeon: Hiram Gash, MD;  Location[facility];  Service: Orthopedics;  Laterality: Left;   SINUS SURGERY WITH INSTATRAK     TOTAL SHOULDER REPLACEMENT     Right shoulder   TOTAL SHOULDER REPLACEMENT  05/2010   Left   TUBAL LIGATION      Current Outpatient Medications  Medication Sig  Dispense Refill   acetaminophen (TYLENOL) 500 MG tablet Take 1,000 mg by mouth 2 (two) times daily as needed for moderate pain.     atorvastatin (LIPITOR) 20 MG tablet TAKE 1 TABLET BY MOUTH AT BEDTIME (Patient taking differently: Take 20 mg by mouth at bedtime.) 90 tablet 1   esomeprazole (NEXIUM) 20 MG capsule Take 20 mg by mouth at bedtime.     fluticasone (FLONASE) 50 MCG/ACT nasal spray Place 2 sprays into both nostrils daily as needed for allergies or rhinitis.      Fluticasone-Umeclidin-Vilant (TRELEGY ELLIPTA) 200-62.5-25 MCG/ACT AEPB Inhale 1 puff into the lungs daily. 28 each 0   halobetasol (ULTRAVATE) 0.05 % cream Apply topically daily. 50 g 3   nystatin (MYCOSTATIN/NYSTOP) powder Apply 1 application topically 2 (two) times daily.  15 g 3   Phenylephrine HCl (SUDAFED PE CHILDRENS) 2.5 MG/5ML SOLN Take 5 mLs by mouth daily as needed (congestion).      sennosides-docusate sodium (SENOKOT-S) 8.6-50 MG tablet Take 2 tablets by mouth daily as needed for constipation.     spironolactone-hydrochlorothiazide (ALDACTAZIDE) 25-25 MG per tablet Take 1 tablet by mouth daily. 90 tablet 3   SYNTHROID 50 MCG tablet PT NEEDS COMPLETE PHYSICAL.  TAKE 1 TABLET (50MCG TOTAL) BY MOUTH DAILY. (Patient taking differently: Take 50 mcg by mouth daily.) 30 tablet 0   tamoxifen (NOLVADEX) 20 MG tablet Take 1 tablet (20 mg total) by mouth daily. 90 tablet 4   venlafaxine XR (EFFEXOR-XR) 150 MG 24 hr capsule TAKE 1 CAPSULE (150 MG TOTAL) BY MOUTH DAILY. (Patient taking differently: Take 150 mg by mouth daily.) 90 capsule 2   verapamil (CALAN-SR) 240 MG CR tablet TAKE 1 TABLET (240 MG TOTAL) BY MOUTH AT BEDTIME. (Patient taking differently: Take 240 mg by mouth at bedtime.) 30 tablet 0   nitroGLYCERIN (NITROSTAT) 0.4 MG SL tablet Place 1 tablet (0.4 mg total) under the tongue every 5 (five) minutes as needed for chest pain. 25 tablet 3   No current facility-administered medications for this visit.    Allergies:   Latex, Aspirin, Oxycodone-acetaminophen, Sulfa antibiotics, and Sulfamethoxazole-trimethoprim    ROS:  Please see the history of present illness.   Otherwise, review of systems are positive for none.   All other systems are reviewed and negative.    PHYSICAL EXAM: VS:  BP (!) 150/77   Pulse 100   Ht 5\' 3"  (1.6 m)   Wt 188 lb (85.3 kg)   SpO2 97%   BMI 33.30 kg/m  , BMI Body mass index is 33.3 kg/m.  GENERAL:  Well appearing NECK:  No jugular venous distention, waveform within normal limits, carotid upstroke brisk and symmetric, no bruits, no thyromegaly LUNGS:  Clear to auscultation bilaterally CHEST:  Unremarkable HEART:  PMI not displaced or sustained,S1 and S2 within normal limits, no S3, no S4, no clicks, no rubs, no  murmurs ABD:  Flat, positive bowel sounds normal in frequency in pitch, no bruits, no rebound, no guarding, no midline pulsatile mass, no hepatomegaly, no splenomegaly EXT:  2 plus pulses throughout, no edema, no cyanosis no clubbing  EKG:  EKG is  ordered today. Sinus rhythm, rate 89, axis within normal limits, early transition lead V2, low voltage in the chest leads, QT prolonged, premature atrial contractions.  PACs.    Recent Labs: 05/28/2020: ALT 31; BUN 17; Creatinine 1.24; Hemoglobin 13.5; Platelet Count 236; Potassium 3.3; Sodium 140    Lipid Panel    Component Value Date/Time   CHOL  208 (H) 07/19/2012 1520   TRIG 152.0 (H) 07/19/2012 1520   HDL 70.30 07/19/2012 1520   CHOLHDL 3 07/19/2012 1520   VLDL 30.4 07/19/2012 1520   LDLCALC 91 06/13/2011 1134   LDLDIRECT 108.4 07/19/2012 1520      Wt Readings from Last 3 Encounters:  05/03/21 188 lb (85.3 kg)  04/24/21 186 lb (84.4 kg)  05/28/20 184 lb 9.6 oz (83.7 kg)      Other studies Reviewed: Additional studies/ records that were reviewed today include: Labs Review of the above records demonstrates: See elsewhere   ASSESSMENT AND PLAN:   PALPITATIONS:   Her rhythm is again today NSR with frequent and multiform PACs.  I don't see atrial fib.  We had a long discussion about this.  She is not particularly symptomatic.  No change in therapy.  She opts not to have another medication.  She is only is symptomatic if she is.  She would let me know if she wants to reconsider this.  HTN:    Her blood pressure is elevated and she is going to keep a blood pressure diary.  I would go up on her Aldactazide if her home blood pressure readings are above target.    Current medicines are reviewed at length with the patient today.  The patient does not have concerns regarding medicines.  The following changes have been made:  None  Labs/ tests ordered today include:   Orders Placed This Encounter  Procedures   EKG 12-Lead        Disposition:   FU with me as needed based on the results of the above.   Signed, [name], MD  05/03/2021 2:34 PM    [place]

## 2021-05-03 ENCOUNTER — Encounter: Payer: Self-pay | Admitting: Cardiology

## 2021-05-03 ENCOUNTER — Other Ambulatory Visit: Payer: Self-pay

## 2021-05-03 ENCOUNTER — Ambulatory Visit (INDEPENDENT_AMBULATORY_CARE_PROVIDER_SITE_OTHER): Payer: MEDICARE | Admitting: Cardiology

## 2021-05-03 VITALS — BP 150/77 | HR 100 | Ht 63.0 in | Wt 188.0 lb

## 2021-05-03 DIAGNOSIS — I10 Essential (primary) hypertension: Secondary | ICD-10-CM

## 2021-05-03 DIAGNOSIS — R002 Palpitations: Secondary | ICD-10-CM

## 2021-05-03 NOTE — Patient Instructions (Addendum)
Medication Instructions:  Your physician recommends that you continue on your current medications as directed. Please refer to the Current Medication list given to you today.  *If you need a refill on your cardiac medications before your next appointment, please call your pharmacy*  Lab Work: NONE ordered at this time of appointment   If you have labs (blood work) drawn today and your tests are completely normal, you will receive your results only by: [place] (if you have MyChart) OR A paper copy in the mail If you have any lab test that is abnormal or we need to change your treatment, we will call you to review the results.  Testing/Procedures: NONE ordered at this time of appointment   Follow-Up: At [facility], you and your health needs are our priority.  As part of our continuing mission to provide you with exceptional heart care, we have created designated Provider Care Teams.  These Care Teams include your primary Cardiologist (physician) and Advanced Practice Providers (APPs -  Physician Assistants and Nurse Practitioners) who all work together to provide you with the care you need, when you need it.  Your next appointment:   6 month(s)  The format for your next appointment:   In Person  Provider:   [name], MD    Other Instructions  MONITOR blood pressure at home. Send via MyChart or give the office a call at [phone]

## 2021-05-07 ENCOUNTER — Encounter: Payer: Self-pay | Admitting: Obstetrics & Gynecology

## 2021-05-13 ENCOUNTER — Telehealth: Payer: Self-pay | Admitting: Internal Medicine

## 2021-05-13 MED ORDER — TRELEGY ELLIPTA 100-62.5-25 MCG/ACT IN AEPB: 1.0000 | INHALATION_SPRAY | Freq: Every day | RESPIRATORY_TRACT | 6 refills | Status: DC

## 2021-05-13 NOTE — Telephone Encounter (Signed)
Refill has been sent to the pharmacy per pts request.  Nothing further is needed.  

## 2021-05-24 ENCOUNTER — Other Ambulatory Visit: Payer: Self-pay

## 2021-05-24 DIAGNOSIS — Z170 Estrogen receptor positive status [ER+]: Secondary | ICD-10-CM

## 2021-05-24 DIAGNOSIS — C50411 Malignant neoplasm of upper-outer quadrant of right female breast: Secondary | ICD-10-CM

## 2021-05-26 NOTE — Progress Notes (Incomplete)
[place]  [phone] [phone]) [phone]     ID: Brenda Stephens DOB: 09-Nov-1940  MR#: [id number]  XBM#:[id number]  Patient Care Team: Brenda Stephens as PCP - General (Physician Assistant) Brenda Breeding, MD as PCP - Cardiology (Cardiology) [name], Brenda Dad, MD as Consulting Physician (Oncology) Brenda Bookbinder, MD as Consulting Physician (General Surgery) Brenda Essex, MD as Consulting Physician (Gastroenterology) OTHER MD:   CHIEF COMPLAINT: Estrogen receptor positive breast cancer  CURRENT TREATMENT: Tamoxifen   INTERVAL HISTORY: Brenda Stephens returns today for follow-up of her estrogen receptor positive breast cancer.   Brenda Stephens continues on tamoxifen with good tolerance. Hot flashes and vaginal dryness are not an issue.  Since her last visit, she underwent bilateral diagnostic mammography with tomography at [facility] on 05/02/2021 showing: breast density category B; no evidence of malignancy in either breast.    REVIEW OF SYSTEMS: Brenda Stephens    COVID 19 VACCINATION STATUS: Status post Brenda Stephens x2, most recently February 2020; no booster as of November 2021    HISTORY OF CURRENT ILLNESS: From the original intake note:  Brenda Stephens had routine bilateral screening mammography at [facility] 01/28/2017. An irregular lesion in the right breast upper outer quadrant was detected and she was recalled for right diagnostic mammography and ultrasonography 02/05/2017. This found the breast density to be category B. In the right breast upper quadrant there was an irregular mass which by ultrasound measured 0.8 cm. It was located at the 11:00 radiant posteriorly. The right axilla was sonographically benign.  Biopsy of the right breast mass in question 02/09/2017 found ([id number]) invasive ductal carcinoma, grade 1, estrogen receptor 100% positive, progesterone receptor 20% positive, both with strong staining intensity, with an MIB-1 of 2% and no HER-2 amplification, the  signals ratio being 1.19 and the number per cell 1.90.  The patient's subsequent history is as detailed below.   PAST MEDICAL HISTORY: Past Medical History:  Diagnosis Date   Allergy    latex   Anemia    Anxiety    Arthritis    Cancer ([place]) 01/2017   right breast cancer   Depression    Dyslipidemia    Eustachian tube dysfunction    GERD (gastroesophageal reflux disease)    Hx of cardiovascular stress test    Myoview 10/13:  apical thinning, EF 72%, no ischemia   Hx of colonic polyps    Dr Brenda Stephens   Hypertension    Hypothyroidism    Other abnormal glucose     PAST SURGICAL HISTORY: Past Surgical History:  Procedure Laterality Date   ABDOMINAL HYSTERECTOMY     BSO for uterine fibroids   ACHILLES TENDON SURGERY Right 10/07/2012   Procedure: RIGHT REPAIR RUPTURE TENDON PRIMARY OPEN/PERCUTANEOUS ;  Surgeon: Brenda Lights, MD;  Location: [place];  Service: Orthopedics;  Laterality: Right;   APPENDECTOMY     BREAST LUMPECTOMY WITH RADIOACTIVE SEED AND SENTINEL LYMPH NODE BIOPSY Right 03/16/2017   Procedure: RIGHT BREAST LUMPECTOMY WITH RADIOACTIVE SEED AND RIGHT AXILLARY SENTINEL  NODE BIOPSY ERAS PATHWAY;  Surgeon: Brenda Bookbinder, MD;  Location: [place];  Service: General;  Laterality: Right;  PECTORAL BLOCK   CATARACT EXTRACTION, BILATERAL     CHOLECYSTECTOMY     COLONOSCOPY W/ POLYPECTOMY     Dr Brenda Stephens   EAR CYST EXCISION Right 10/07/2012   Procedure: EXCISION BONE CYST BENIGN TUMOR Brenda Stephens ;  Surgeon: Brenda Lights, MD;  Location: [place];  Service: Orthopedics;  Laterality:  Right;   EXPLORATORY LAPAROTOMY     LEFT HEART CATH AND CORONARY ANGIOGRAPHY N/A 08/02/2019   Procedure: LEFT HEART CATH AND CORONARY ANGIOGRAPHY;  Surgeon: Martinique, Peter M, MD;  Location: [place] [facility];  Service: Cardiovascular;  Laterality: N/A;   REVISION TOTAL SHOULDER TO REVERSE TOTAL SHOULDER Left 01/12/2019   Procedure: REVISION  TOTAL SHOULDER TO REVERSE TOTAL SHOULDER;  Surgeon: Brenda Gash, MD;  Location: [facility];  Service: Orthopedics;  Laterality: Left;   SINUS SURGERY WITH INSTATRAK     TOTAL SHOULDER REPLACEMENT     Right shoulder   TOTAL SHOULDER REPLACEMENT  05/2010   Left   TUBAL LIGATION      FAMILY HISTORY Family History  Problem Relation Age of Onset   Hiatal hernia Mother    Heart attack Mother 54   Heart disease Father 53       CAD @ autopsy   Cancer Father        renal cancer   Heart attack Brother 76       fatal   Diabetes Neg Hx    Stroke Neg Hx   The patient's father died from kidney cancer at age 52. The patient's mother died from a heart attack at age 84. The patient had one brother, who died from a heart attack. She had no sisters. There is no history of breast or ovarian cancer in the family.   GYNECOLOGIC HISTORY:  No LMP recorded. Patient has had a hysterectomy. Menarche: age 54 First live birth: age 21 [place] P86.  Hysterectomy? Yes BSO? Yes HR: yes, for between 5 and 10 years.   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 Her husband was Brenda Stephens of [place] for railroad company. He retired then worked as Special educational needs teacher. He died 2020/01/31. Their daughter Brenda Stephens is an attorney but currently works as a Education officer, museum in [place]. The patient has no grandchildren. She is not a church attender    ADVANCED DIRECTIVES: The patient intends to name her daughter Brenda Stephens as her healthcare power of attorney. The appropriate papers were given to her to complete and notarized at her discretion at the 05/29/2019 visit.   HEALTH MAINTENANCE: Social History   Tobacco Use   Smoking status: Former    Packs/day: 0.50    Years: 6.00    Pack years: 3.00    Types: Cigarettes    Quit date: 06/30/1962    Years since quitting: 58.9   Smokeless tobacco: Never   Tobacco comments:    Quit age 82  Vaping Use   Vaping Use: Never used   Substance Use Topics   Alcohol use: Yes    Alcohol/week: 3.0 standard drinks    Types: 3 Glasses of wine per week    Comment: wine  : < 3 glasses weekly   Drug use: No     Colonoscopy:May got  PAP: Status post hysterectomy  Bone density: Due/[facility]   Allergies  Allergen Reactions   Latex Rash   Aspirin Other (See Comments)    GI bleeding   Oxycodone-Acetaminophen Nausea Only   Sulfa Antibiotics Nausea Only   Sulfamethoxazole-Trimethoprim Nausea Only    Current Outpatient Medications  Medication Sig Dispense Refill   acetaminophen (TYLENOL) 500 MG tablet Take 1,000 mg by mouth 2 (two) times daily as needed for moderate pain.     atorvastatin (LIPITOR) 20 MG tablet TAKE 1 TABLET BY MOUTH AT BEDTIME (Patient taking differently:  Take 20 mg by mouth at bedtime.) 90 tablet 1   esomeprazole (NEXIUM) 20 MG capsule Take 20 mg by mouth at bedtime.     fluticasone (FLONASE) 50 MCG/ACT nasal spray Place 2 sprays into both nostrils daily as needed for allergies or rhinitis.      Fluticasone-Umeclidin-Vilant (TRELEGY ELLIPTA) 100-62.5-25 MCG/ACT AEPB Inhale 1 puff into the lungs daily. 60 each 6   Fluticasone-Umeclidin-Vilant (TRELEGY ELLIPTA) 200-62.5-25 MCG/ACT AEPB Inhale 1 puff into the lungs daily. 28 each 0   halobetasol (ULTRAVATE) 0.05 % cream Apply topically daily. 50 g 3   nitroGLYCERIN (NITROSTAT) 0.4 MG SL tablet Place 1 tablet (0.4 mg total) under the tongue every 5 (five) minutes as needed for chest pain. 25 tablet 3   nystatin (MYCOSTATIN/NYSTOP) powder Apply 1 application topically 2 (two) times daily. 15 g 3   Phenylephrine HCl (SUDAFED PE CHILDRENS) 2.5 MG/5ML SOLN Take 5 mLs by mouth daily as needed (congestion).      sennosides-docusate sodium (SENOKOT-S) 8.6-50 MG tablet Take 2 tablets by mouth daily as needed for constipation.     spironolactone-hydrochlorothiazide (ALDACTAZIDE) 25-25 MG per tablet Take 1 tablet by mouth daily. 90 tablet 3   SYNTHROID 50 MCG tablet PT  NEEDS COMPLETE PHYSICAL.  TAKE 1 TABLET (50MCG TOTAL) BY MOUTH DAILY. (Patient taking differently: Take 50 mcg by mouth daily.) 30 tablet 0   tamoxifen (NOLVADEX) 20 MG tablet Take 1 tablet (20 mg total) by mouth daily. 90 tablet 4   venlafaxine XR (EFFEXOR-XR) 150 MG 24 hr capsule TAKE 1 CAPSULE (150 MG TOTAL) BY MOUTH DAILY. (Patient taking differently: Take 150 mg by mouth daily.) 90 capsule 2   verapamil (CALAN-SR) 240 MG CR tablet TAKE 1 TABLET (240 MG TOTAL) BY MOUTH AT BEDTIME. (Patient taking differently: Take 240 mg by mouth at bedtime.) 30 tablet 0   No current facility-administered medications for this visit.    OBJECTIVE: White woman who appears stated age  There were no vitals filed for this visit.    There is no height or weight on file to calculate BMI.   Wt Readings from Last 3 Encounters:  05/03/21 188 lb (85.3 kg)  04/24/21 186 lb (84.4 kg)  05/28/20 184 lb 9.6 oz (83.7 kg)      ECOG FS:1 - Symptomatic but completely ambulatory  Sclerae unicteric, EOMs intact Wearing a mask No cervical or supraclavicular adenopathy Lungs no rales or rhonchi Heart regular rate and rhythm Abd soft, nontender, positive bowel sounds MSK no focal spinal tenderness, no upper extremity lymphedema Neuro: nonfocal, well oriented, appropriate affect Breasts:    {Sclerae unicteric, EOMs intact Wearing a mask No cervical or supraclavicular adenopathy Lungs no rales or rhonchi Heart regular rate and rhythm Abd soft, nontender, positive bowel sounds MSK no focal spinal tenderness, no upper extremity lymphedema Neuro: nonfocal, well oriented, appropriate affect Breasts: The right breast is benign per the left breast is status post lumpectomy and radiation. There is no evidence of local recurrence. Both axillae are benign.}   LAB RESULTS:  CMP     Component Value Date/Time   NA 140 05/28/2020 1040   NA 138 07/26/2019 1548   NA 132 (L) 03/13/2017 1252   K 3.3 (L) 05/28/2020 1040    K 4.3 03/13/2017 1252   CL 104 05/28/2020 1040   CO2 24 05/28/2020 1040   CO2 26 03/13/2017 1252   GLUCOSE 107 (H) 05/28/2020 1040   GLUCOSE 107 03/13/2017 1252   BUN 17 05/28/2020 1040  BUN 15 07/26/2019 1548   BUN 14.8 03/13/2017 1252   CREATININE 1.24 (H) 05/28/2020 1040   CREATININE 0.8 03/13/2017 1252   CALCIUM 10.1 05/28/2020 1040   CALCIUM 10.0 03/13/2017 1252   PROT 7.2 05/28/2020 1040   PROT 7.2 03/13/2017 1252   ALBUMIN 3.7 05/28/2020 1040   ALBUMIN 4.0 03/13/2017 1252   AST 25 05/28/2020 1040   AST 18 03/13/2017 1252   ALT 31 05/28/2020 1040   ALT 18 03/13/2017 1252   ALKPHOS 48 05/28/2020 1040   ALKPHOS 71 03/13/2017 1252   BILITOT 0.4 05/28/2020 1040   BILITOT 0.54 03/13/2017 1252   GFRNONAA 45 (L) 05/28/2020 1040   GFRAA >60 09/10/2019 1029    Lab Results  Component Value Date   WBC 9.9 05/28/2020   NEUTROABS 6.7 05/28/2020   HGB 13.5 05/28/2020   HCT 41.1 05/28/2020   MCV 91.7 05/28/2020   PLT 236 05/28/2020      Chemistry      Component Value Date/Time   NA 140 05/28/2020 1040   NA 138 07/26/2019 1548   NA 132 (L) 03/13/2017 1252   K 3.3 (L) 05/28/2020 1040   K 4.3 03/13/2017 1252   CL 104 05/28/2020 1040   CO2 24 05/28/2020 1040   CO2 26 03/13/2017 1252   BUN 17 05/28/2020 1040   BUN 15 07/26/2019 1548   BUN 14.8 03/13/2017 1252   CREATININE 1.24 (H) 05/28/2020 1040   CREATININE 0.8 03/13/2017 1252      Component Value Date/Time   CALCIUM 10.1 05/28/2020 1040   CALCIUM 10.0 03/13/2017 1252   ALKPHOS 48 05/28/2020 1040   ALKPHOS 71 03/13/2017 1252   AST 25 05/28/2020 1040   AST 18 03/13/2017 1252   ALT 31 05/28/2020 1040   ALT 18 03/13/2017 1252   BILITOT 0.4 05/28/2020 1040   BILITOT 0.54 03/13/2017 1252       No results found for: LABCA2  No components found for: [id number]  No results for input(s): INR in the last 168 hours.  No results found for: LABCA2  No results found for: MCE022  No results found for:  VVK122  No results found for: ESL753  No results found for: CA2729  No components found for: HGQUANT  No results found for: CEA1 / No results found for: CEA1   No results found for: AFPTUMOR  No results found for: CHROMOGRNA  No results found for: TOTALPROTELP, ALBUMINELP, A1GS, A2GS, BETS, BETA2SER, GAMS, MSPIKE, SPEI (this displays SPEP labs)  No results found for: KPAFRELGTCHN, LAMBDASER, KAPLAMBRATIO (kappa/lambda light chains)  No results found for: HGBA, HGBA2QUANT, HGBFQUANT, HGBSQUAN (Hemoglobinopathy evaluation)   No results found for: LDH  Lab Results  Component Value Date   IRON 59 12/18/2010   IRONPCTSAT 12.5 (L) 12/18/2010   (Iron and TIBC)  No results found for: FERRITIN  Urinalysis    Component Value Date/Time   COLORURINE YELLOW 06/04/2010 0845   APPEARANCEUR CLOUDY (A) 06/04/2010 0845   LABSPEC 1.011 06/04/2010 0845   PHURINE 7.5 06/04/2010 0845   GLUCOSEU NEGATIVE 06/04/2010 0845   HGBUR NEGATIVE 06/04/2010 0845   BILIRUBINUR NEGATIVE 06/04/2010 0845   KETONESUR NEGATIVE 06/04/2010 0845   PROTEINUR NEGATIVE 06/04/2010 0845   UROBILINOGEN 0.2 06/04/2010 0845   NITRITE NEGATIVE 06/04/2010 0845   LEUKOCYTESUR LARGE (A) 06/04/2010 0845    STUDIES: No results found.   ELIGIBLE FOR AVAILABLE RESEARCH PROTOCOL: no  ASSESSMENT: 80 y.o. [place] woman status post left breast upper outer quadrant biopsy 02/09/2017  for a clinical T1b N0, stage IA invasive ductal carcinoma, grade 1, estrogen and progesterone receptor positive, HER-2 nonamplified, with an MIB-1 of 2%  (1) left lumpectomy with sentinel lymph node sampling  03/16/2017 showed a pT1b pN0, stage IA invasive ductal carcinoma, grade 1, with negative margins (2) adjuvant radiation not anticipated  (2) adjuvant radiation not felt to be needed if patient takes antiestrogen for 5 years  (3) Tamoxifen started 04/10/2017  (a) status post remote hysterectomy with bilateral  salpingo-oophorectomy  (b) bone density at [facility] 03/30/2017 shows a T score of -1.0   PLAN: Jadelin is now a little over 3 years out from definitive surgery for her breast cancer with no evidence of disease recurrence. This is very favorable.  She is tolerating tamoxifen with essentially no side effects and she obtains it at a very good price. The plan is to continue that for a total of 5 years.  I did encourage her to start a walking program. Even 15 minutes a day would make an enormous difference. Her goal is to be able to go up the 2 sets of 7 steps she has at home without having to stop.  Total encounter time 50 minutes.*   [name], Brenda Dad, MD  05/26/21 9:31 PM Medical Oncology and Hematology [facility] [place], [place] [address] Tel. [phone]    Fax. [phone]    I, Wilburn Mylar, am acting as scribe for Dr. Virgie Stephens. [name].  I, Lurline Del MD, have reviewed the above documentation for accuracy and completeness, and I agree with the above.   *Total Encounter Time as defined by the Centers for Medicare and Medicaid Services includes, in addition to the face-to-face time of a patient visit (documented in the note above) non-face-to-face time: obtaining and reviewing outside history, ordering and reviewing medications, tests or procedures, care coordination (communications with other health care professionals or caregivers) and documentation in the medical record.

## 2021-05-27 ENCOUNTER — Inpatient Hospital Stay: Payer: MEDICARE | Admitting: Oncology

## 2021-05-27 ENCOUNTER — Telehealth: Payer: Self-pay | Admitting: Oncology

## 2021-05-27 ENCOUNTER — Inpatient Hospital Stay: Payer: MEDICARE | Attending: Internal Medicine

## 2021-05-27 NOTE — Telephone Encounter (Signed)
Rescheduled appointment per patient request. Patient is aware.

## 2021-05-28 ENCOUNTER — Other Ambulatory Visit: Payer: MEDICARE

## 2021-05-28 ENCOUNTER — Ambulatory Visit: Payer: MEDICARE | Admitting: Oncology

## 2021-06-11 ENCOUNTER — Other Ambulatory Visit: Payer: Self-pay

## 2021-06-11 ENCOUNTER — Inpatient Hospital Stay: Payer: MEDICARE

## 2021-06-11 ENCOUNTER — Inpatient Hospital Stay: Payer: MEDICARE | Attending: Internal Medicine | Admitting: Oncology

## 2021-06-11 VITALS — BP 171/82 | HR 93 | Temp 97.5°F | Resp 16 | Ht 63.0 in | Wt 185.5 lb

## 2021-06-11 DIAGNOSIS — C50411 Malignant neoplasm of upper-outer quadrant of right female breast: Secondary | ICD-10-CM | POA: Diagnosis not present

## 2021-06-11 DIAGNOSIS — C50412 Malignant neoplasm of upper-outer quadrant of left female breast: Secondary | ICD-10-CM | POA: Insufficient documentation

## 2021-06-11 DIAGNOSIS — Z170 Estrogen receptor positive status [ER+]: Secondary | ICD-10-CM

## 2021-06-11 DIAGNOSIS — Z79810 Long term (current) use of selective estrogen receptor modulators (SERMs): Secondary | ICD-10-CM | POA: Insufficient documentation

## 2021-06-11 LAB — CBC WITH DIFFERENTIAL (CANCER CENTER ONLY)
Abs Immature Granulocytes: 0.03 10*3/uL (ref 0.00–0.07)
Basophils Absolute: 0.1 10*3/uL (ref 0.0–0.1)
Basophils Relative: 1 %
Eosinophils Absolute: 0.2 10*3/uL (ref 0.0–0.5)
Eosinophils Relative: 3 %
HCT: 39.4 % (ref 36.0–46.0)
Hemoglobin: 13 g/dL (ref 12.0–15.0)
Immature Granulocytes: 0 %
Lymphocytes Relative: 23 %
Lymphs Abs: 1.8 10*3/uL (ref 0.7–4.0)
MCH: 30.3 pg (ref 26.0–34.0)
MCHC: 33 g/dL (ref 30.0–36.0)
MCV: 91.8 fL (ref 80.0–100.0)
Monocytes Absolute: 0.8 10*3/uL (ref 0.1–1.0)
Monocytes Relative: 10 %
Neutro Abs: 4.9 10*3/uL (ref 1.7–7.7)
Neutrophils Relative %: 63 %
Platelet Count: 235 10*3/uL (ref 150–400)
RBC: 4.29 MIL/uL (ref 3.87–5.11)
RDW: 12.5 % (ref 11.5–15.5)
WBC Count: 7.7 10*3/uL (ref 4.0–10.5)
nRBC: 0 % (ref 0.0–0.2)

## 2021-06-11 LAB — CMP (CANCER CENTER ONLY)
ALT: 14 U/L (ref 0–44)
AST: 18 U/L (ref 15–41)
Albumin: 3.8 g/dL (ref 3.5–5.0)
Alkaline Phosphatase: 51 U/L (ref 38–126)
Anion gap: 11 (ref 5–15)
BUN: 11 mg/dL (ref 8–23)
CO2: 21 mmol/L — ABNORMAL LOW (ref 22–32)
Calcium: 9.1 mg/dL (ref 8.9–10.3)
Chloride: 109 mmol/L (ref 98–111)
Creatinine: 1.08 mg/dL — ABNORMAL HIGH (ref 0.44–1.00)
GFR, Estimated: 52 mL/min — ABNORMAL LOW (ref >60)
Glucose, Bld: 109 mg/dL — ABNORMAL HIGH (ref 70–99)
Potassium: 4 mmol/L (ref 3.5–5.1)
Sodium: 141 mmol/L (ref 135–145)
Total Bilirubin: 0.5 mg/dL (ref 0.3–1.2)
Total Protein: 6.9 g/dL (ref 6.5–8.1)

## 2021-06-11 MED ORDER — FLUCONAZOLE 100 MG PO TABS: 100.0000 mg | ORAL_TABLET | Freq: Every day | ORAL | 0 refills | Status: DC

## 2021-06-11 MED ORDER — KETOCONAZOLE 2 % EX CREA: 1.0000 "application " | TOPICAL_CREAM | Freq: Every day | CUTANEOUS | 2 refills | Status: DC

## 2021-06-11 MED ORDER — TAMOXIFEN CITRATE 20 MG PO TABS: 20.0000 mg | ORAL_TABLET | Freq: Every day | ORAL | 4 refills | Status: DC

## 2021-06-11 NOTE — Progress Notes (Signed)
Brenda Stephens  Telephone:(336) [phone] Fax:(336) [phone]     ID: Brenda Stephens DOB: 1941/03/13  MR#: [id number]  LSL#:[id number]  Patient Care Team: [name] as PCP - General (Physician Assistant) [name], MD as PCP - Cardiology (Cardiology) Kelina Beauchamp, [name], MD as Consulting Physician (Oncology) [name], MD as Consulting Physician (General Surgery) [name], MD as Consulting Physician (Gastroenterology) OTHER MD:   CHIEF COMPLAINT: Estrogen receptor positive breast cancer  CURRENT TREATMENT: Tamoxifen   INTERVAL HISTORY: Brenda Stephens returns today for follow-up of her estrogen receptor positive breast cancer.    She continues on tamoxifen, with good tolerance. Hot flashes and vaginal dryness are not an issue.  Since her last visit, she underwent bilateral diagnostic mammography with tomography at [facility] on 05/02/2021 showing: breast density category B; no evidence of malignancy in either breast.    REVIEW OF SYSTEMS: Brenda Stephens loves to read and is taking a senior classes.  She is not exercising regularly although she tells me there are areas where she can walk nearby.  She has not had any falls and tells me she has a good sense of balance.  A detailed review of systems today was noncontributory   COVID 19 VACCINATION STATUS: Status post [place] x2, most recently February 2020; no booster as of November 2021    HISTORY OF CURRENT ILLNESS: From the original intake note:  Brenda Stephens had routine bilateral screening mammography at [facility] 01/28/2017. An irregular lesion in the right breast upper outer quadrant was detected and she was recalled for right diagnostic mammography and ultrasonography 02/05/2017. This found the breast density to be category B. In the right breast upper quadrant there was an irregular mass which by ultrasound measured 0.8 cm. It was located at the 11:00 radiant posteriorly. The right axilla was sonographically  benign.  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  The patient's subsequent history is as detailed below.   PAST MEDICAL HISTORY: Past Medical History:  Diagnosis Date   Allergy    latex   Anemia    Anxiety    Arthritis    Cancer (Ali Molina) 01/2017   right breast cancer   Depression    Dyslipidemia    Eustachian tube dysfunction    GERD (gastroesophageal reflux disease)    Hx of cardiovascular stress test    Myoview 10/13:  apical thinning, EF 72%, no ischemia   Hx of colonic polyps    Dr Watt Climes   Hypertension    Hypothyroidism    Other abnormal glucose     PAST SURGICAL HISTORY: Past Surgical History:  Procedure Laterality Date   ABDOMINAL HYSTERECTOMY     BSO for uterine fibroids   ACHILLES TENDON SURGERY Right 10/07/2012   Procedure: RIGHT REPAIR RUPTURE TENDON PRIMARY OPEN/PERCUTANEOUS ;  Surgeon: Ninetta Lights, MD;  Location: [facility];  Service: Orthopedics;  Laterality: Right;   APPENDECTOMY     BREAST LUMPECTOMY WITH RADIOACTIVE SEED AND SENTINEL LYMPH NODE BIOPSY Right 03/16/2017   Procedure: RIGHT BREAST LUMPECTOMY WITH RADIOACTIVE SEED AND RIGHT AXILLARY SENTINEL  NODE BIOPSY ERAS PATHWAY;  Surgeon: [name], MD;  Location: [facility];  Service: General;  Laterality: Right;  PECTORAL BLOCK   CATARACT EXTRACTION, BILATERAL  CHOLECYSTECTOMY     COLONOSCOPY W/ POLYPECTOMY     Dr Watt Climes   EAR CYST EXCISION Right 10/07/2012   Procedure: EXCISION BONE CYST BENIGN TUMOR [name] ;  Surgeon: Ninetta Lights, MD;  Location: [facility];  Service: Orthopedics;  Laterality: Right;   EXPLORATORY LAPAROTOMY     LEFT HEART CATH AND  CORONARY ANGIOGRAPHY N/A 08/02/2019   Procedure: LEFT HEART CATH AND CORONARY ANGIOGRAPHY;  Surgeon: Martinique, Peter M, MD;  Location: [facility];  Service: Cardiovascular;  Laterality: N/A;   REVISION TOTAL SHOULDER TO REVERSE TOTAL SHOULDER Left 01/12/2019   Procedure: REVISION TOTAL SHOULDER TO REVERSE TOTAL SHOULDER;  Surgeon: Hiram Gash, MD;  Location: [facility];  Service: Orthopedics;  Laterality: Left;   SINUS SURGERY WITH INSTATRAK     TOTAL SHOULDER REPLACEMENT     Right shoulder   TOTAL SHOULDER REPLACEMENT  05/2010   Left   TUBAL LIGATION      FAMILY HISTORY Family History  Problem Relation Age of Onset   Hiatal hernia Mother    Heart attack Mother 52   Heart disease Father 17       CAD @ autopsy   Cancer Father        renal cancer   Heart attack Brother 27       fatal   Diabetes Neg Hx    Stroke Neg Hx   The patient's father died from kidney cancer at age 73. The patient's mother died from a heart attack at age 36. The patient had one brother, who died from a heart attack. She had no sisters. There is no history of breast or ovarian cancer in the family.   GYNECOLOGIC HISTORY:  No LMP recorded. Patient has had a hysterectomy. Menarche: age 28 First live birth: age 48 [place] [address].  Hysterectomy? Yes BSO? Yes HR: yes, for between 5 and 10 years.   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 Her husband was Mudlogger of [place] for railroad company. He retired then worked as Special educational needs teacher. He died 02/04/2020. Their daughter [name] is an attorney but currently works as a Education officer, museum in [place]. The patient has no grandchildren. She is not a church attender    ADVANCED DIRECTIVES: The patient intends to name her daughter [name] as her healthcare power of attorney. The appropriate papers were given to her to complete and notarized at her discretion at the 05/29/2019 visit.   HEALTH MAINTENANCE: Social History    Tobacco Use   Smoking status: Former    Packs/day: 0.50    Years: 6.00    Pack years: 3.00    Types: Cigarettes    Quit date: 06/30/1962    Years since quitting: 58.9   Smokeless tobacco: Never   Tobacco comments:    Quit age 28  Vaping Use   Vaping Use: Never used  Substance Use Topics   Alcohol use: Yes    Alcohol/week: 3.0 standard drinks    Types: 3 Glasses of wine per week    Comment: wine  : < 3 glasses weekly   Drug use: No     Colonoscopy:May got  PAP: Status post hysterectomy  Bone density: Due/[facility]   Allergies  Allergen Reactions   Latex Rash   Aspirin Other (See Comments)    GI bleeding   Oxycodone-Acetaminophen Nausea Only   Sulfa Antibiotics Nausea Only   Sulfamethoxazole-Trimethoprim Nausea Only  Current Outpatient Medications  Medication Sig Dispense Refill   fluconazole (DIFLUCAN) 100 MG tablet Take 1 tablet (100 mg total) by mouth daily. 10 tablet 0   ketoconazole (NIZORAL) 2 % cream Apply 1 application topically daily. 15 g 2   acetaminophen (TYLENOL) 500 MG tablet Take 1,000 mg by mouth 2 (two) times daily as needed for moderate pain.     atorvastatin (LIPITOR) 20 MG tablet TAKE 1 TABLET BY MOUTH AT BEDTIME (Patient taking differently: Take 20 mg by mouth at bedtime.) 90 tablet 1   esomeprazole (NEXIUM) 20 MG capsule Take 20 mg by mouth at bedtime.     fluticasone (FLONASE) 50 MCG/ACT nasal spray Place 2 sprays into both nostrils daily as needed for allergies or rhinitis.      Fluticasone-Umeclidin-Vilant (TRELEGY ELLIPTA) 100-62.5-25 MCG/ACT AEPB Inhale 1 puff into the lungs daily. 60 each 6   Fluticasone-Umeclidin-Vilant (TRELEGY ELLIPTA) 200-62.5-25 MCG/ACT AEPB Inhale 1 puff into the lungs daily. 28 each 0   halobetasol (ULTRAVATE) 0.05 % cream Apply topically daily. 50 g 3   nitroGLYCERIN (NITROSTAT) 0.4 MG SL tablet Place 1 tablet (0.4 mg total) under the tongue every 5 (five) minutes as needed for chest pain. 25 tablet 3   nystatin  (MYCOSTATIN/NYSTOP) powder Apply 1 application topically 2 (two) times daily. 15 g 3   Phenylephrine HCl (SUDAFED PE CHILDRENS) 2.5 MG/5ML SOLN Take 5 mLs by mouth daily as needed (congestion).      sennosides-docusate sodium (SENOKOT-S) 8.6-50 MG tablet Take 2 tablets by mouth daily as needed for constipation.     spironolactone-hydrochlorothiazide (ALDACTAZIDE) 25-25 MG per tablet Take 1 tablet by mouth daily. 90 tablet 3   SYNTHROID 50 MCG tablet PT NEEDS COMPLETE PHYSICAL.  TAKE 1 TABLET (50MCG TOTAL) BY MOUTH DAILY. (Patient taking differently: Take 50 mcg by mouth daily.) 30 tablet 0   tamoxifen (NOLVADEX) 20 MG tablet Take 1 tablet (20 mg total) by mouth daily. 90 tablet 4   venlafaxine XR (EFFEXOR-XR) 150 MG 24 hr capsule TAKE 1 CAPSULE (150 MG TOTAL) BY MOUTH DAILY. (Patient taking differently: Take 150 mg by mouth daily.) 90 capsule 2   verapamil (CALAN-SR) 240 MG CR tablet TAKE 1 TABLET (240 MG TOTAL) BY MOUTH AT BEDTIME. (Patient taking differently: Take 240 mg by mouth at bedtime.) 30 tablet 0   No current facility-administered medications for this visit.    OBJECTIVE: White woman in no acute distress  Vitals:   06/11/21 1549  BP: (!) 171/82  Pulse: 93  Resp: 16  Temp: (!) 97.5 F (36.4 C)  SpO2: 99%      Body mass index is 32.86 kg/m.   Wt Readings from Last 3 Encounters:  06/11/21 185 lb 8 oz (84.1 kg)  05/03/21 188 lb (85.3 kg)  04/24/21 186 lb (84.4 kg)     ECOG FS:1 - Symptomatic but completely ambulatory  Sclerae unicteric, EOMs intact Wearing a mask No cervical or supraclavicular adenopathy Lungs no rales or rhonchi Heart regular rate and rhythm Abd soft, nontender, positive bowel sounds MSK mild kyphosis but no focal spinal tenderness, no upper extremity lymphedema Neuro: nonfocal, well oriented, appropriate affect Breasts: The right breast is unremarkable.  The left breast is status postlumpectomy and radiation with no evidence of local recurrence.  Both  axillae are benign. Skin: There is a significant erythematous confluent rash in both inframammary areas   LAB RESULTS:  CMP     Component Value Date/Time   NA 141 06/11/2021 1533  NA 138 07/26/2019 1548   NA 132 (L) 03/13/2017 1252   K 4.0 06/11/2021 1533   K 4.3 03/13/2017 1252   CL 109 06/11/2021 1533   CO2 21 (L) 06/11/2021 1533   CO2 26 03/13/2017 1252   GLUCOSE 109 (H) 06/11/2021 1533   GLUCOSE 107 03/13/2017 1252   BUN 11 06/11/2021 1533   BUN 15 07/26/2019 1548   BUN 14.8 03/13/2017 1252   CREATININE 1.08 (H) 06/11/2021 1533   CREATININE 0.8 03/13/2017 1252   CALCIUM 9.1 06/11/2021 1533   CALCIUM 10.0 03/13/2017 1252   PROT 6.9 06/11/2021 1533   PROT 7.2 03/13/2017 1252   ALBUMIN 3.8 06/11/2021 1533   ALBUMIN 4.0 03/13/2017 1252   AST 18 06/11/2021 1533   AST 18 03/13/2017 1252   ALT 14 06/11/2021 1533   ALT 18 03/13/2017 1252   ALKPHOS 51 06/11/2021 1533   ALKPHOS 71 03/13/2017 1252   BILITOT 0.5 06/11/2021 1533   BILITOT 0.54 03/13/2017 1252   GFRNONAA 52 (L) 06/11/2021 1533   GFRAA >60 09/10/2019 1029    Lab Results  Component Value Date   WBC 7.7 06/11/2021   NEUTROABS 4.9 06/11/2021   HGB 13.0 06/11/2021   HCT 39.4 06/11/2021   MCV 91.8 06/11/2021   PLT 235 06/11/2021      Chemistry      Component Value Date/Time   NA 141 06/11/2021 1533   NA 138 07/26/2019 1548   NA 132 (L) 03/13/2017 1252   K 4.0 06/11/2021 1533   K 4.3 03/13/2017 1252   CL 109 06/11/2021 1533   CO2 21 (L) 06/11/2021 1533   CO2 26 03/13/2017 1252   BUN 11 06/11/2021 1533   BUN 15 07/26/2019 1548   BUN 14.8 03/13/2017 1252   CREATININE 1.08 (H) 06/11/2021 1533   CREATININE 0.8 03/13/2017 1252      Component Value Date/Time   CALCIUM 9.1 06/11/2021 1533   CALCIUM 10.0 03/13/2017 1252   ALKPHOS 51 06/11/2021 1533   ALKPHOS 71 03/13/2017 1252   AST 18 06/11/2021 1533   AST 18 03/13/2017 1252   ALT 14 06/11/2021 1533   ALT 18 03/13/2017 1252   BILITOT 0.5  06/11/2021 1533   BILITOT 0.54 03/13/2017 1252       No results found for: LABCA2  No components found for: [id number]  No results for input(s): INR in the last 168 hours.  No results found for: LABCA2  No results found for: YTW446  No results found for: KMM381  No results found for: RRN165  No results found for: CA2729  No components found for: HGQUANT  No results found for: CEA1 / No results found for: CEA1   No results found for: AFPTUMOR  No results found for: CHROMOGRNA  No results found for: TOTALPROTELP, ALBUMINELP, A1GS, A2GS, BETS, BETA2SER, GAMS, MSPIKE, SPEI (this displays SPEP labs)  No results found for: KPAFRELGTCHN, LAMBDASER, KAPLAMBRATIO (kappa/lambda light chains)  No results found for: HGBA, HGBA2QUANT, HGBFQUANT, HGBSQUAN (Hemoglobinopathy evaluation)   No results found for: LDH  Lab Results  Component Value Date   IRON 59 12/18/2010   IRONPCTSAT 12.5 (L) 12/18/2010   (Iron and TIBC)  No results found for: FERRITIN  Urinalysis    Component Value Date/Time   COLORURINE YELLOW 06/04/2010 0845   APPEARANCEUR CLOUDY (A) 06/04/2010 0845   LABSPEC 1.011 06/04/2010 0845   PHURINE 7.5 06/04/2010 0845   GLUCOSEU NEGATIVE 06/04/2010 0845   HGBUR NEGATIVE 06/04/2010 0845   BILIRUBINUR NEGATIVE  06/04/2010 0845   KETONESUR NEGATIVE 06/04/2010 0845   PROTEINUR NEGATIVE 06/04/2010 0845   UROBILINOGEN 0.2 06/04/2010 0845   NITRITE NEGATIVE 06/04/2010 0845   LEUKOCYTESUR LARGE (A) 06/04/2010 0845    STUDIES: No results found.   ELIGIBLE FOR AVAILABLE RESEARCH PROTOCOL: no  ASSESSMENT: 80 y.o. [place] woman status post left breast upper outer quadrant biopsy 02/09/2017 for a clinical T1b N0, stage IA invasive ductal carcinoma, grade 1, estrogen and progesterone receptor positive, HER-2 nonamplified, with an MIB-1 of 2%  (1) left lumpectomy with sentinel lymph node sampling  03/16/2017 showed a pT1b pN0, stage IA invasive ductal carcinoma,  grade 1, with negative margins (2) adjuvant radiation not anticipated  (2) adjuvant radiation not felt to be needed if patient takes antiestrogen for 5 years  (3) tamoxifen started 04/10/2017  (a) status post remote hysterectomy with bilateral salpingo-oophorectomy  (b) [facility] density at [facility] 03/30/2017 shows a T score of -1.0   PLAN: Brenda Stephens is now a little over 4 years out from definitive surgery for her breast cancer with no evidence of disease recurrence.  This is very favorable.  She is tolerating tamoxifen well and the plan is to continue that to a total of 5 years which will take [place] to October 2023.  At that time she will be ready to "graduate".  She has had a significant inframammary rash involving both breasts.  She has been using nystatin powder without success.  I wrote her for Ketoconazole cream to use daily until clear and Diflucan to take in addition for the first several days.  She understands this is a problem that is relatively easy to control but almost impossible to eradicate unless she is able to avoid skin on skin areas under the breast.  If her current bras are unable to do that for her we could write her for additional bras and she could go to second to nature to explore the possibility.  She will let us know  I encouraged her to start a walking program and specifically to consider this celebrate the trails to recovery available through the [facility] website  Otherwise she will see [place] next October.  She knows to call for any other issue that may develop before then.  Total encounter time 25 minutes.*   Terecia Plaut, [name], MD  06/11/21 4:43 PM Medical Oncology and Hematology [address] [place] [address], [place] [address] Tel. [phone]    Fax. [phone]    I, Wilburn Mylar, am acting as scribe for Dr. [name]. Franchot Pollitt.  I, Lurline Del MD, have reviewed the above documentation for accuracy and completeness, and I agree with the  above.   *Total Encounter Time as defined by the Centers for Medicare and Medicaid Services includes, in addition to the face-to-face time of a patient visit (documented in the note above) non-face-to-face time: obtaining and reviewing outside history, ordering and reviewing medications, tests or procedures, care coordination (communications with other health care professionals or caregivers) and documentation in the medical record.

## 2021-08-21 ENCOUNTER — Other Ambulatory Visit: Payer: Self-pay | Admitting: Gastroenterology

## 2021-08-21 DIAGNOSIS — R109 Unspecified abdominal pain: Secondary | ICD-10-CM

## 2021-09-11 ENCOUNTER — Ambulatory Visit
Admission: RE | Admit: 2021-09-11 | Discharge: 2021-09-11 | Disposition: A | Discharge status: Home / Self Care | Payer: MEDICARE | Source: Ambulatory Visit | Attending: Gastroenterology | Admitting: Gastroenterology

## 2021-09-11 DIAGNOSIS — R109 Unspecified abdominal pain: Secondary | ICD-10-CM

## 2021-09-11 MED ORDER — IOPAMIDOL (ISOVUE-300) INJECTION 61%
100.0000 mL | Freq: Once | INTRAVENOUS | Status: AC | PRN
  Administered 2021-09-11: 100 mL via INTRAVENOUS

## 2021-09-20 ENCOUNTER — Encounter (HOSPITAL_COMMUNITY): Payer: Self-pay | Admitting: *Deleted

## 2021-09-20 ENCOUNTER — Emergency Department (HOSPITAL_COMMUNITY)
Admission: EM | Admit: 2021-09-20 | Discharge: 2021-09-21 | Disposition: A | Discharge status: Home / Self Care | Payer: MEDICARE | Attending: Emergency Medicine | Admitting: Emergency Medicine

## 2021-09-20 ENCOUNTER — Emergency Department (HOSPITAL_COMMUNITY): Payer: MEDICARE

## 2021-09-20 ENCOUNTER — Other Ambulatory Visit: Payer: Self-pay

## 2021-09-20 DIAGNOSIS — Z91040 Latex allergy status: Secondary | ICD-10-CM | POA: Insufficient documentation

## 2021-09-20 DIAGNOSIS — Z87891 Personal history of nicotine dependence: Secondary | ICD-10-CM | POA: Diagnosis not present

## 2021-09-20 DIAGNOSIS — Z853 Personal history of malignant neoplasm of breast: Secondary | ICD-10-CM | POA: Insufficient documentation

## 2021-09-20 DIAGNOSIS — R42 Dizziness and giddiness: Secondary | ICD-10-CM | POA: Insufficient documentation

## 2021-09-20 DIAGNOSIS — R112 Nausea with vomiting, unspecified: Secondary | ICD-10-CM | POA: Insufficient documentation

## 2021-09-20 DIAGNOSIS — I959 Hypotension, unspecified: Secondary | ICD-10-CM | POA: Insufficient documentation

## 2021-09-20 LAB — COMPREHENSIVE METABOLIC PANEL
ALT: 22 U/L (ref 0–44)
AST: 33 U/L (ref 15–41)
Albumin: 3.8 g/dL (ref 3.5–5.0)
Alkaline Phosphatase: 42 U/L (ref 38–126)
Anion gap: 7 (ref 5–15)
BUN: 16 mg/dL (ref 8–23)
CO2: 21 mmol/L — ABNORMAL LOW (ref 22–32)
Calcium: 9.4 mg/dL (ref 8.9–10.3)
Chloride: 108 mmol/L (ref 98–111)
Creatinine, Ser: 1.41 mg/dL — ABNORMAL HIGH (ref 0.44–1.00)
GFR, Estimated: 38 mL/min — ABNORMAL LOW (ref >60)
Glucose, Bld: 138 mg/dL — ABNORMAL HIGH (ref 70–99)
Potassium: 4.1 mmol/L (ref 3.5–5.1)
Sodium: 136 mmol/L (ref 135–145)
Total Bilirubin: 0.5 mg/dL (ref 0.3–1.2)
Total Protein: 6.6 g/dL (ref 6.5–8.1)

## 2021-09-20 LAB — CBC
HCT: 40.5 % (ref 36.0–46.0)
Hemoglobin: 13.2 g/dL (ref 12.0–15.0)
MCH: 30.6 pg (ref 26.0–34.0)
MCHC: 32.6 g/dL (ref 30.0–36.0)
MCV: 93.8 fL (ref 80.0–100.0)
Platelets: 218 10*3/uL (ref 150–400)
RBC: 4.32 MIL/uL (ref 3.87–5.11)
RDW: 12 % (ref 11.5–15.5)
WBC: 9.5 10*3/uL (ref 4.0–10.5)
nRBC: 0 % (ref 0.0–0.2)

## 2021-09-20 LAB — TROPONIN I (HIGH SENSITIVITY): Troponin I (High Sensitivity): 13 ng/L (ref <18)

## 2021-09-20 MED ORDER — MECLIZINE HCL 25 MG PO TABS: 25.0000 mg | ORAL_TABLET | Freq: Three times a day (TID) | ORAL | 0 refills | Status: DC | PRN

## 2021-09-20 NOTE — ED Provider Notes (Addendum)
?[facility] ?Provider Note ? ? ?CSN: [id number] ?Arrival date & time: 09/20/21  2100 ? ?  ? ?History  ? ?Chief Complaint  ?Patient presents with  ? Dizziness  ? ? ?Brenda Stephens is a 81 y.o. female presenting for intermittent dizzy episodes as well as abnormal heart rate from urgent care.  Patient was seen in urgent care concerned she may have atrial fibrillation.  Patient was previously seen by cardiologist who determined to be not having atrial fibrillation.  Patient reports these episodes have been happening for 2 to 3 weeks.  She feels like she is spinning when they occur and has nausea associated with them.  When she was seen at urgent care she started having low heart rates and low blood pressures.  She then vomited and all her symptoms went away and her blood pressure improved.  Patient denies any active symptoms at this time.  Endorses 1 episode of vomiting. ? ?HPI ? ?  ? ?Home Medications ?Prior to Admission medications   ?Medication Sig Start Date End Date Taking? Authorizing Provider  ?meclizine (ANTIVERT) 25 MG tablet Take 1 tablet (25 mg total) by mouth 3 (three) times daily as needed for dizziness. 09/20/21  Yes Lupita Dawn, MD  ?acetaminophen (TYLENOL) 500 MG tablet Take 1,000 mg by mouth 2 (two) times daily as needed for moderate pain.    [provider]  ?atorvastatin (LIPITOR) 20 MG tablet TAKE 1 TABLET BY MOUTH AT BEDTIME ?Patient taking differently: Take 20 mg by mouth at bedtime. 05/19/13   Hendricks Limes, MD  ?esomeprazole (NEXIUM) 20 MG capsule Take 20 mg by mouth at bedtime.    [provider]  ?fluconazole (DIFLUCAN) 100 MG tablet Take 1 tablet (100 mg total) by mouth daily. 06/11/21   [name], [name], MD  ?fluticasone (FLONASE) 50 MCG/ACT nasal spray Place 2 sprays into both nostrils daily as needed for allergies or rhinitis.  08/29/19   [provider]  ?Fluticasone-Umeclidin-Vilant (TRELEGY ELLIPTA) 100-62.5-25 MCG/ACT  AEPB Inhale 1 puff into the lungs daily. 05/13/21   Baird Lyons D, MD  ?Fluticasone-Umeclidin-Vilant (TRELEGY ELLIPTA) 200-62.5-25 MCG/ACT AEPB Inhale 1 puff into the lungs daily. 04/24/21   Deneise Lever, MD  ?halobetasol (ULTRAVATE) 0.05 % cream Apply topically daily. 04/24/20   Princess Bruins, MD  ?ketoconazole (NIZORAL) 2 % cream Apply 1 application topically daily. 06/11/21   [name], [name], MD  ?nitroGLYCERIN (NITROSTAT) 0.4 MG SL tablet Place 1 tablet (0.4 mg total) under the tongue every 5 (five) minutes as needed for chest pain. 07/26/19 10/24/19  [name], MD  ?nystatin (MYCOSTATIN/NYSTOP) powder Apply 1 application topically 2 (two) times daily. 04/24/20   Princess Bruins, MD  ?Phenylephrine HCl (SUDAFED PE CHILDRENS) 2.5 MG/5ML SOLN Take 5 mLs by mouth daily as needed (congestion).     [provider]  ?sennosides-docusate sodium (SENOKOT-S) 8.6-50 MG tablet Take 2 tablets by mouth daily as needed for constipation.    [provider]  ?spironolactone-hydrochlorothiazide (ALDACTAZIDE) 25-25 MG per tablet Take 1 tablet by mouth daily. 07/19/12   Hendricks Limes, MD  ?SYNTHROID 50 MCG tablet PT NEEDS COMPLETE PHYSICAL.  TAKE 1 TABLET (50MCG TOTAL) BY MOUTH DAILY. ?Patient taking differently: Take 50 mcg by mouth daily. 04/07/14   Hendricks Limes, MD  ?tamoxifen (NOLVADEX) 20 MG tablet Take 1 tablet (20 mg total) by mouth daily. 06/11/21   [name], [name], MD  ?venlafaxine XR (EFFEXOR-XR) 150 MG 24 hr capsule TAKE 1  CAPSULE (150 MG TOTAL) BY MOUTH DAILY. ?Patient taking differently: Take 150 mg by mouth daily. 12/14/12   Hendricks Limes, MD  ?verapamil (CALAN-SR) 240 MG CR tablet TAKE 1 TABLET (240 MG TOTAL) BY MOUTH AT BEDTIME. ?Patient taking differently: Take 240 mg by mouth at bedtime. 02/27/14   Hendricks Limes, MD  ?   ? ?Allergies    ?Latex, Aspirin, Oxycodone-acetaminophen, Sulfa antibiotics, and Sulfamethoxazole-trimethoprim   ? ?Review of Systems    ?Review of Systems  ?Constitutional:  Negative for appetite change and fever.  ?Gastrointestinal:  Positive for nausea and vomiting.  ?Neurological:  Positive for dizziness and light-headedness.  ? ?Physical Exam ?Updated Vital Signs ?BP 134/65   Pulse 94   Temp 97.9 ?F (36.6 ?C)   Resp 18   Ht '5\' 3"'$  (1.6 m)   Wt 84.1 kg   SpO2 97%   BMI 32.84 kg/m?  ?Physical Exam ?Vitals and nursing note reviewed.  ?Constitutional:   ?   General: She is not in acute distress. ?HENT:  ?   Head: Normocephalic and atraumatic.  ?Eyes:  ?   Extraocular Movements: Extraocular movements intact.  ?   Pupils: Pupils are equal, round, and reactive to light.  ?Cardiovascular:  ?   Rate and Rhythm: Normal rate and regular rhythm.  ?Pulmonary:  ?   Effort: Pulmonary effort is normal. No respiratory distress.  ?Abdominal:  ?   General: There is no distension.  ?   Tenderness: There is no abdominal tenderness.  ?Musculoskeletal:     ?   General: No swelling or tenderness.  ?Neurological:  ?   General: No focal deficit present.  ?   Mental Status: She is alert and oriented to person, place, and time.  ?   Cranial Nerves: No cranial nerve deficit.  ?   Motor: No weakness.  ? ?MENTAL STATUS EXAM:    ?Orientation: Alert and oriented to person, place and time.  ?Memory: Cooperative, follows commands well.  ?Language: Speech is clear and language is normal.  ? ?CRANIAL NERVES:    ?CN 2 (Optic): Visual fields intact to confrontation.  ?CN 3,4,6 (EOM): Pupils equal and reactive to light. Full extraocular eye movement without nystagmus.  ?CN 5 (Trigeminal): Facial sensation is normal, no weakness of masticatory muscles.  ?CN 7 (Facial): No facial weakness or asymmetry.  ?CN 8 (Auditory): Auditory acuity grossly normal.  ?CN 9,10 (Glossophar): The uvula is midline, the palate elevates symmetrically.  ?CN 11 (spinal access): Normal sternocleidomastoid and trapezius strength.  ?CN 12 (Hypoglossal): The tongue is midline. No atrophy or  fasciculations..  ? ?MOTOR:  Muscle Strength: 5/5 RUE, 5/5 LUE, 5/5 RLE, 5/5 LLE ? ?REFLEXES: No clonus ? ?COORDINATION:   Intact finger-to-nose, no tremor, no pronator drift.  ? ?SENSATION:   Intact to light touch all four extremities. ? ?GAIT: Gait normal without ataxia  ? ?ED Results / Procedures / Treatments   ?Labs ?(all labs ordered are listed, but only abnormal results are displayed) ?Labs Reviewed  ?COMPREHENSIVE METABOLIC PANEL - Abnormal; Notable for the following components:  ?    Result Value  ? CO2 21 (*)   ? Glucose, Bld 138 (*)   ? Creatinine, Ser 1.41 (*)   ? GFR, Estimated 38 (*)   ? All other components within normal limits  ?CBC  ?TROPONIN I (HIGH SENSITIVITY)  ? ? ?EKG ?EKG Interpretation ? ?Date/Time:  Friday September 20 2021 21:06:54 EDT ?Ventricular Rate:  91 ?PR Interval:  161 ?QRS  Duration: 74 ?QT Interval:  393 ?QTC Calculation: 484 ?R Axis:   7 ?Text Interpretation: Sinus tachycardia Abnormal R-wave progression, early transition Borderline T abnormalities, anterior leads Confirmed by Lennice Sites [phone]) on 09/20/2021 9:21:18 PM ? ?Radiology ?CT Head Wo Contrast ? ?Result Date: 09/20/2021 ?CLINICAL DATA:  Dizziness. EXAM: CT HEAD WITHOUT CONTRAST TECHNIQUE: Contiguous axial images were obtained from the base of the skull through the vertex without intravenous contrast. RADIATION DOSE REDUCTION: This exam was performed according to the departmental dose-optimization program which includes automated exposure control, adjustment of the mA and/or kV according to patient size and/or use of iterative reconstruction technique. COMPARISON:  Head CT dated 11/22/2011. FINDINGS: Brain: Mild age-related atrophy and chronic microvascular ischemic changes. There is no acute intracranial hemorrhage. No mass effect or midline shift. No extra-axial fluid collection. Vascular: No hyperdense vessel or unexpected calcification. Skull: Normal. Negative for fracture or focal lesion. Sinuses/Orbits: No acute finding.  Other: None IMPRESSION: 1. No acute intracranial pathology. 2. Mild age-related atrophy and chronic microvascular ischemic changes. Electronically Signed   By: Anner Crete M.D.   On: 09/20/2021 22:36  ? ?DG Chest [facility]

## 2021-09-20 NOTE — Discharge Instructions (Addendum)
You were evaluated in the Emergency Department and after careful evaluation, we did not find any emergent condition requiring admission or further testing in the hospital. ? ?Your exam/testing today was overall reassuring. Please follow-up with ear nose and throat for further evaluation of your dizziness. You can take meclizine as needed when having your dizziness episodes.  ? ?Please return to the Emergency Department if you experience any worsening of your condition.  Thank you for allowing [place] to be a part of your care.  ?

## 2021-09-20 NOTE — ED Triage Notes (Signed)
The pt arrived from [facility] by [facility]  pt started c/o dizziness with nausea this am   she had neck pain  rt shoulder  and lower bp   she tehn vomited all her pain stopped and her bp went back to n ormal   on arrival  no pain she reports that all her symptoms have subsided  the pt was sent from [facility] for treatment.  Iv lt hand numb er 22 by [facility]  daughter at  the bedside  she has had intermittent dizziness for the past 2-3 weeks ?

## 2021-10-10 ENCOUNTER — Ambulatory Visit (INDEPENDENT_AMBULATORY_CARE_PROVIDER_SITE_OTHER): Payer: MEDICARE | Admitting: Physician Assistant

## 2021-10-10 ENCOUNTER — Encounter: Payer: Self-pay | Admitting: Physician Assistant

## 2021-10-10 VITALS — BP 122/64 | HR 103 | Ht 63.0 in | Wt 187.0 lb

## 2021-10-10 DIAGNOSIS — R002 Palpitations: Secondary | ICD-10-CM

## 2021-10-10 DIAGNOSIS — I10 Essential (primary) hypertension: Secondary | ICD-10-CM

## 2021-10-10 DIAGNOSIS — E785 Hyperlipidemia, unspecified: Secondary | ICD-10-CM

## 2021-10-10 NOTE — Patient Instructions (Addendum)
Medication Instructions:  ?Your physician recommends that you continue on your current medications as directed. Please refer to the Current Medication list given to you today. ? ?*If you need a refill on your cardiac medications before your next appointment, please call your pharmacy* ? ?Lab Work: ?NONE ordered at this time of appointment  ? ?If you have labs (blood work) drawn today and your tests are completely normal, you will receive your results only by: ?MyChart Message (if you have MyChart) OR ?A paper copy in the mail ?If you have any lab test that is abnormal or we need to change your treatment, we will call you to review the results. ? ?Testing/Procedures: ?NONE ordered at this time of appointment  ? ?Follow-Up: ?At [facility], you and your health needs are our priority.  As part of our continuing mission to provide you with exceptional heart care, we have created designated Provider Care Teams.  These Care Teams include your primary Cardiologist (physician) and Advanced Practice Providers (APPs -  Physician Assistants and Nurse Practitioners) who all work together to provide you with the care you need, when you need it. ? ?Your next appointment:   ?3-4 month(s) ? ?The format for your next appointment:   ?In Person ? ?Provider:   ?[name], MD   ? ? ?Other Instructions ?Daily intake of fluids should be between 32 oz and 64 oz ?Continue to monitor for symptoms  ?Call PCP to follow up for Lung Nodules seen on recent CT ? ?Important Information About Sugar ? ? ? ? ? ? ?

## 2021-10-10 NOTE — Progress Notes (Signed)
?Cardiology Office Note:   ? ?Date:  10/12/2021  ? ?ID:  Brenda Stephens, DOB Jan 21, 1941, MRN [id number] ? ?PCP:  Jola Baptist, PA-C ?  ?[facility] Providers ?Cardiologist:  [name], MD    ? ?Referring MD: Jola Baptist, PA-C  ? ?Chief Complaint  ?Patient presents with  ? Follow-up  ?  Post ED.  ? ? ?History of Present Illness:   ? ?Brenda Stephens is a 81 y.o. female with a hx of right breast cancer diagnosed in 2018, hyperlipidemia, depression, GERD, hypertension and hypothyroidism.  Patient had Myoview in October 2013 that showed apical thinning, EF 72%, no ischemia.  She was seen for palpitation and leg swelling in 2019.  Due to chest discomfort, patient ultimately underwent cardiac catheterization by Dr. Martinique on 08/02/2019 that showed EF 55 to 65%, normal coronary arteries, normal LVEDP.  There was a question of atrial fibrillation however her monitor in August 2020 did not show any evidence of atrial fibrillation or sustained arrhythmia.  Given the lack of significant symptoms, no change in the therapy was recommended during the last office visit in November 2022.  She is being followed by Dr. Jana Hakim of hematology oncology service.  She is also being followed by neurology service at [facility] for memory issues. More recently, patient was seen in the ED on 09/20/2021 for intermittent dizziness as well as abnormal heart rate.  She was initially seen in urgent care, provider at the urgent care who was concerned she may have atrial fibrillation.  When she was hooked up to the telemetry, EKG appears to show sinus rhythm with PACs.  CT of the head showed no acute intracranial abnormality, chronic microvascular ischemic changes.  Troponin negative.  Chest x-ray no acute changes.  Her creatinine appears to be elevated to 1.4, her baseline creatinine should be between 1.0-1.2.  It was recommended the patient follow-up with ENT as outpatient.  She was prescribed meclizine. ? ?Patient presents today  for cardiology follow-up accompanied by her daughter.  Her dizziness has improved.  We discussed the recent lab work, I recommend she increase hydration to between 32 to 64 ounces, this likely will improve her renal function as well.  Her daughter never picked up the meclizine.  She denies any recent prolonged tachypalpitations that suggest atrial fibrillation.  EKG in the emergency room has been reviewed as well.  Unless she has prolonged episode of tachypalpitations, I would not recommend a repeat heart monitor at this time.  Her daughter has a pulse oximeter will can keep track of her heart rate.  She will let us know if her heart rate suddenly jumps.  Otherwise, she can follow-up with Dr. Percival Spanish in 3 to 4 months. ? ?Past Medical History:  ?Diagnosis Date  ? Allergy   ? latex  ? Anemia   ? Anxiety   ? Arthritis   ? Cancer ([place]) 01/2017  ? right breast cancer  ? Depression   ? Dyslipidemia   ? Eustachian tube dysfunction   ? GERD (gastroesophageal reflux disease)   ? Hx of cardiovascular stress test   ? Myoview 10/13:  apical thinning, EF 72%, no ischemia  ? Hx of colonic polyps   ? Dr Watt Climes  ? Hypertension   ? Hypothyroidism   ? Other abnormal glucose   ? ? ?Past Surgical History:  ?Procedure Laterality Date  ? ABDOMINAL HYSTERECTOMY    ? BSO for uterine fibroids  ? ACHILLES TENDON SURGERY Right 10/07/2012  ?  Procedure: RIGHT REPAIR RUPTURE TENDON PRIMARY OPEN/PERCUTANEOUS ;  Surgeon: Ninetta Lights, MD;  Location: [place];  Service: Orthopedics;  Laterality: Right;  ? APPENDECTOMY    ? BREAST LUMPECTOMY WITH RADIOACTIVE SEED AND SENTINEL LYMPH NODE BIOPSY Right 03/16/2017  ? Procedure: RIGHT BREAST LUMPECTOMY WITH RADIOACTIVE SEED AND RIGHT AXILLARY SENTINEL  NODE BIOPSY ERAS PATHWAY;  Surgeon: [name], MD;  Location: [place];  Service: General;  Laterality: Right;  PECTORAL BLOCK  ? CATARACT EXTRACTION, BILATERAL    ? CHOLECYSTECTOMY    ? COLONOSCOPY W/  POLYPECTOMY    ? Dr Watt Climes  ? EAR CYST EXCISION Right 10/07/2012  ? Procedure: EXCISION BONE CYST BENIGN TUMOR [name] ;  Surgeon: Ninetta Lights, MD;  Location: [place];  Service: Orthopedics;  Laterality: Right;  ? EXPLORATORY LAPAROTOMY    ? LEFT HEART CATH AND CORONARY ANGIOGRAPHY N/A 08/02/2019  ? Procedure: LEFT HEART CATH AND CORONARY ANGIOGRAPHY;  Surgeon: Martinique, Peter M, MD;  Location: [facility];  Service: Cardiovascular;  Laterality: N/A;  ? REVISION TOTAL SHOULDER TO REVERSE TOTAL SHOULDER Left 01/12/2019  ? Procedure: REVISION TOTAL SHOULDER TO REVERSE TOTAL SHOULDER;  Surgeon: Hiram Gash, MD;  Location[facility];  Service: Orthopedics;  Laterality: Left;  ? SINUS SURGERY WITH INSTATRAK    ? TOTAL SHOULDER REPLACEMENT    ? Right shoulder  ? TOTAL SHOULDER REPLACEMENT  05/2010  ? Left  ? TUBAL LIGATION    ? ? ?Current Medications: ?Current Meds  ?Medication Sig  ? acetaminophen (TYLENOL) 500 MG tablet Take 1,000 mg by mouth 2 (two) times daily as needed for moderate pain.  ? atorvastatin (LIPITOR) 20 MG tablet TAKE 1 TABLET BY MOUTH AT BEDTIME (Patient taking differently: Take 20 mg by mouth at bedtime.)  ? esomeprazole (NEXIUM) 20 MG capsule Take 20 mg by mouth at bedtime.  ? fluconazole (DIFLUCAN) 100 MG tablet Take 1 tablet (100 mg total) by mouth daily.  ? fluticasone (FLONASE) 50 MCG/ACT nasal spray Place 2 sprays into both nostrils daily as needed for allergies or rhinitis.   ? halobetasol (ULTRAVATE) 0.05 % cream Apply topically daily.  ? ketoconazole (NIZORAL) 2 % cream Apply 1 application topically daily.  ? meclizine (ANTIVERT) 25 MG tablet Take 1 tablet (25 mg total) by mouth 3 (three) times daily as needed for dizziness.  ? nystatin (MYCOSTATIN/NYSTOP) powder Apply 1 application topically 2 (two) times daily.  ? sennosides-docusate sodium (SENOKOT-S) 8.6-50 MG tablet Take 2 tablets by mouth daily as needed for constipation.  ?  spironolactone-hydrochlorothiazide (ALDACTAZIDE) 25-25 MG per tablet Take 1 tablet by mouth daily.  ? SYNTHROID 50 MCG tablet PT NEEDS COMPLETE PHYSICAL.  TAKE 1 TABLET (50MCG TOTAL) BY MOUTH DAILY. (Patient taking differently: Take 50 mcg by mouth daily.)  ? tamoxifen (NOLVADEX) 20 MG tablet Take 1 tablet (20 mg total) by mouth daily.  ? venlafaxine XR (EFFEXOR-XR) 150 MG 24 hr capsule TAKE 1 CAPSULE (150 MG TOTAL) BY MOUTH DAILY. (Patient taking differently: Take 150 mg by mouth daily.)  ?  ? ?Allergies:   Latex, Aspirin, Oxycodone-acetaminophen, Sulfa antibiotics, and Sulfamethoxazole-trimethoprim  ? ?Social History  ? ?Socioeconomic History  ? Marital status: Widowed  ?  Spouse name: Not on file  ? Number of children: 1  ? Years of education: Not on file  ? Highest education level: Not on file  ?Occupational History  ? Not on file  ?Tobacco Use  ? Smoking status:  Former  ?  Packs/day: 0.50  ?  Years: 6.00  ?  Pack years: 3.00  ?  Types: Cigarettes  ?  Quit date: 06/30/1962  ?  Years since quitting: 59.3  ? Smokeless tobacco: Never  ? Tobacco comments:  ?  Quit age 3  ?Vaping Use  ? Vaping Use: Never used  ?Substance and Sexual Activity  ? Alcohol use: Yes  ?  Alcohol/week: 3.0 standard drinks  ?  Types: 3 Glasses of wine per week  ?  Comment: wine  : < 3 glasses weekly  ? Drug use: No  ? Sexual activity: Yes  ?  Birth control/protection: Post-menopausal, Surgical  ?Other Topics Concern  ? Not on file  ?Social History Narrative  ? Lives husband and daughter lives with her.   ? ?Social Determinants of Health  ? ?Financial Resource Strain: Not on file  ?Food Insecurity: Not on file  ?Transportation Needs: Not on file  ?Physical Activity: Not on file  ?Stress: Not on file  ?Social Connections: Not on file  ?  ? ?Family History: ?The patient's family history includes Cancer in her father; Heart attack (age of onset: 58) in her brother; Heart attack (age of onset: 2) in her mother; Heart disease (age of onset: 21) in  her father; Hiatal hernia in her mother. There is no history of Diabetes or Stroke. ? ?ROS:   ?Please see the history of present illness.    ? All other systems reviewed and are negative. ? ?EKGs/Labs/Other Studies Reviewed:   ? ?The following s

## 2021-10-12 ENCOUNTER — Encounter: Payer: Self-pay | Admitting: Physician Assistant

## 2021-10-23 NOTE — Progress Notes (Signed)
?Subjective:  ? ? Patient ID: Brenda Stephens, female    DOB: 07/28/1940, 81 y.o.   MRN: [id number] ? ?HPI ?F former smoker followed for allergic rhinitis, Bronchitis, complicated by HBP, GERD, Anemia, R breast cancer ?Walk Test 10/12/19- desat to 89% with max HR 95. Mostly sats around 94-96. ?6MWT 03/28/21- 306 meters, average pace, stopping x3 for SOB, lowest O2 sat 95%, max HR 103. ?Cardiac cath 08/02/19- normal ?PFT10/26/22- she was again unable to follow directions- when to inhale and when to blow out- tech unable to complete test.  ?Hgb 12.7 9/13 [facility] ?------------------------------------------------------------------------------------ ? ? ?04/24/21-  81 yoF former smoker (3 pk yrs)followed for allergic rhinitis, acute bronchitis,  complicated by HBP, GERD, Anemia, R breast cancer ?Covid vax-2 Phizer ?Flu vax-had ?- no inhalers ?6MWT 03/28/21- 306 meters, average pace, stopping x3 for SOB, lowest O2 sat 95%, max HR 103. ?Cardiac cath 08/02/19- normal ?PFT10/26/22- she was again unable to follow directions- when to inhale and when to blow out- tech unable to complete test.  ?Hgb 12.7 9/13 [facility] ?Notes some wheeze most nights as she gets ready for bed, before lying down. Can't tell any benefit from albuterol rescue inhaler for her sense of some dyspnea on exertion. Little cough, no phlegm, chest pain or palpitation. Dr Percival Spanish has seen for cardiology. ? ?10/24/21- 81 yoF former smoker (3 pk yrs)followed for allergic rhinitis, acute bronchitis,  complicated by HBP, GERD, Anemia, R breast cancer, Alzheimers,  ?-albuterol hfa, Flonase,  ?Covid vax-2 Phizer ?Flu vax-had ?-----Follow up. Patient says she is having trouble breathing.  ?She has been more aware of dyspnea on exertion with a little bit of wheezing over the last 2 months or so-chest pollen season.  Some mild cough productive of brown/yellow sputum over the last 2 weeks.  No fever or sore throat.  Trelegy had helped but too expensive. ?CT abdomen in March had shown  a 5 mm right lower lobe nodule of concern in this former smoker with history of breast cancer.  We discussed and will take radiologist recommendation to repeat CT in 3 months. ?CXR 09/20/21 1V- ?IMPRESSION: ?No evidence of acute cardiopulmonary disease ? ? ?ROS-see HPI   + = positive ?Constitutional:   No-   weight loss, night sweats, fevers, chills, fatigue, lassitude. ?HEENT:   + headaches, difficulty swallowing, tooth/dental problems, sore throat,  ?     No-  sneezing, itching, ear ache, nasal congestion, +post nasal drip,  ?CV:  No-   chest pain, orthopnea, PND, swelling in lower extremities, anasarca,  dizziness, palpitations ?Resp: + shortness of breath with exertion or at rest.   ?            +productive cough,  No non-productive cough,  No- coughing up of blood.   ?+   change in color of mucus.  No- wheezing.   ?Skin: No-   rash or lesions. ?GI:  No-   heartburn, indigestion, abdominal pain, nausea, vomiting, ?GU:  ?MS:  No-   joint pain or swelling.  ?Neuro-     nothing unusual ?Psych:  No- change in mood or affect. No depression or anxiety.  No memory loss. ? ?  ?Objective:  ?OBJ- Physical Exam ?General- Alert, Oriented, Affect-appropriate, Distress- none acute, + overweight ?Skin- rash-none, lesions- none, excoriation- none ?Lymphadenopathy- none ?Head- atraumatic ?           Eyes- Gross vision intact, PERRLA, conjunctivae and secretions clear ?           Ears-  Hearing ok,  ?           Nose-, no-Septal dev,  polyps, erosion, perforation  ?           Throat- Mallampati III , mucosa clear , drainage- none, tonsils- atrophic ?Neck- flexible , trachea midline, no stridor , thyroid nl, carotid no bruit ?Chest - symmetrical excursion , unlabored ?          Heart/CV- RRR , no murmur , no gallop  , no rub, nl s1 s2 ?                          - JVD- none , edema-none, stasis changes- none, varices- none ?          Lung- clear to P&A, wheeze- none, cough none, dullness-none, rub- none ?          Chest wall-   ?Abd- ?Br/ Gen/ Rectal- Not done, not indicated ?Extrem- cyanosis- none, clubbing, none, atrophy- none, strength- nl.  ?Neuro- +slight head bob ?Assessment & Plan:  ? ?

## 2021-10-24 ENCOUNTER — Encounter: Payer: Self-pay | Admitting: Internal Medicine

## 2021-10-24 ENCOUNTER — Ambulatory Visit (INDEPENDENT_AMBULATORY_CARE_PROVIDER_SITE_OTHER): Payer: MEDICARE | Admitting: Internal Medicine

## 2021-10-24 DIAGNOSIS — R911 Solitary pulmonary nodule: Secondary | ICD-10-CM

## 2021-10-24 DIAGNOSIS — J209 Acute bronchitis, unspecified: Secondary | ICD-10-CM

## 2021-10-24 MED ORDER — DOXYCYCLINE HYCLATE 100 MG PO TABS: 100.0000 mg | ORAL_TABLET | Freq: Two times a day (BID) | ORAL | 0 refills | Status: DC

## 2021-10-24 NOTE — Assessment & Plan Note (Signed)
Likely benign. ?Plan-CT chest in 3 months (June, 2023) ?

## 2021-10-24 NOTE — Assessment & Plan Note (Signed)
We will treat sputum discoloration as acute bronchitis ?Plan-doxycycline.  Try sample Breztri for effect.  If it works well we will look at it for price, hopefully cheaper than Trelegy. ?

## 2021-10-24 NOTE — Patient Instructions (Addendum)
Script sent to [facility] for doxycycline antibiotic ? ?Order- sample x 1 Breztri inhaler      inhale 2 puffs then rinse mouth, twice daily ? ?CT chest June, 2023    RLL nodule ?

## 2021-10-28 NOTE — Progress Notes (Deleted)
81 y.o. G1P1 Widowed {Race/ethnicity:17218} female here for annual exam.    PCP:     No LMP recorded. Patient has had a hysterectomy.           Sexually active: {yes no:314532}  The current method of family planning is {contraception:315051}.    Exercising: {yes no:314532}  {types:19826} Smoker:  {YES NO:22349}  Health Maintenance: Pap:  08-05-17 normal ,11-04-10 Normal neg HR HPV History of abnormal Pap:  {YES NO:22349} MMG:  05-07-21 normal Cat. B Bi-RADS 2 Colonoscopy:  2014 normal BMD:   03-30-17 T score -1.0  Result  normal TDaP:  *** Gardasil:   {YES NO:22349} HIV: Hep C: Screening Labs:  Hb today: ***, Urine today: ***   reports that she quit smoking about 59 years ago. Her smoking use included cigarettes. She has a 3.00 pack-year smoking history. She has never used smokeless tobacco. She reports current alcohol use of about 3.0 standard drinks per week. She reports that she does not use drugs.  Past Medical History:  Diagnosis Date   Allergy    latex   Anemia    Anxiety    Arthritis    Cancer ([place]) 01/2017   right breast cancer   Depression    Dyslipidemia    Eustachian tube dysfunction    GERD (gastroesophageal reflux disease)    Hx of cardiovascular stress test    Myoview 10/13:  apical thinning, EF 72%, no ischemia   Hx of colonic polyps    Dr Watt Climes   Hypertension    Hypothyroidism    Other abnormal glucose     Past Surgical History:  Procedure Laterality Date   ABDOMINAL HYSTERECTOMY     BSO for uterine fibroids   ACHILLES TENDON SURGERY Right 10/07/2012   Procedure: RIGHT REPAIR RUPTURE TENDON PRIMARY OPEN/PERCUTANEOUS ;  Surgeon: Ninetta Lights, MD;  Location: [place];  Service: Orthopedics;  Laterality: Right;   APPENDECTOMY     BREAST LUMPECTOMY WITH RADIOACTIVE SEED AND SENTINEL LYMPH NODE BIOPSY Right 03/16/2017   Procedure: RIGHT BREAST LUMPECTOMY WITH RADIOACTIVE SEED AND RIGHT AXILLARY SENTINEL  NODE BIOPSY ERAS PATHWAY;   Surgeon: [name], MD;  Location: [place];  Service: General;  Laterality: Right;  PECTORAL BLOCK   CATARACT EXTRACTION, BILATERAL     CHOLECYSTECTOMY     COLONOSCOPY W/ POLYPECTOMY     Dr Watt Climes   EAR CYST EXCISION Right 10/07/2012   Procedure: EXCISION BONE CYST BENIGN TUMOR [name] ;  Surgeon: Ninetta Lights, MD;  Location: [place];  Service: Orthopedics;  Laterality: Right;   EXPLORATORY LAPAROTOMY     LEFT HEART CATH AND CORONARY ANGIOGRAPHY N/A 08/02/2019   Procedure: LEFT HEART CATH AND CORONARY ANGIOGRAPHY;  Surgeon: Martinique, Peter M, MD;  Location: [facility];  Service: Cardiovascular;  Laterality: N/A;   REVISION TOTAL SHOULDER TO REVERSE TOTAL SHOULDER Left 01/12/2019   Procedure: REVISION TOTAL SHOULDER TO REVERSE TOTAL SHOULDER;  Surgeon: Hiram Gash, MD;  [facility];  Service: Orthopedics;  Laterality: Left;   SINUS SURGERY WITH INSTATRAK     TOTAL SHOULDER REPLACEMENT     Right shoulder   TOTAL SHOULDER REPLACEMENT  05/2010   Left   TUBAL LIGATION      Current Outpatient Medications  Medication Sig Dispense Refill   acetaminophen (TYLENOL) 500 MG tablet Take 1,000 mg by mouth 2 (two) times daily as needed for moderate pain.     atorvastatin (LIPITOR)  20 MG tablet TAKE 1 TABLET BY MOUTH AT BEDTIME (Patient taking differently: Take 20 mg by mouth at bedtime.) 90 tablet 1   doxycycline (VIBRA-TABS) 100 MG tablet Take 1 tablet (100 mg total) by mouth 2 (two) times daily. 14 tablet 0   esomeprazole (NEXIUM) 20 MG capsule Take 20 mg by mouth at bedtime.     fluticasone (FLONASE) 50 MCG/ACT nasal spray Place 2 sprays into both nostrils daily as needed for allergies or rhinitis.      halobetasol (ULTRAVATE) 0.05 % cream Apply topically daily. 50 g 3   ketoconazole (NIZORAL) 2 % cream Apply 1 application topically daily. 15 g 2   meclizine (ANTIVERT) 25 MG tablet Take 1 tablet (25 mg total) by mouth 3 (three) times  daily as needed for dizziness. 30 tablet 0   nitroGLYCERIN (NITROSTAT) 0.4 MG SL tablet Place 1 tablet (0.4 mg total) under the tongue every 5 (five) minutes as needed for chest pain. 25 tablet 3   nystatin (MYCOSTATIN/NYSTOP) powder Apply 1 application topically 2 (two) times daily. 15 g 3   sennosides-docusate sodium (SENOKOT-S) 8.6-50 MG tablet Take 2 tablets by mouth daily as needed for constipation.     spironolactone-hydrochlorothiazide (ALDACTAZIDE) 25-25 MG per tablet Take 1 tablet by mouth daily. 90 tablet 3   SYNTHROID 50 MCG tablet PT NEEDS COMPLETE PHYSICAL.  TAKE 1 TABLET (50MCG TOTAL) BY MOUTH DAILY. (Patient taking differently: Take 50 mcg by mouth daily.) 30 tablet 0   tamoxifen (NOLVADEX) 20 MG tablet Take 1 tablet (20 mg total) by mouth daily. 90 tablet 4   venlafaxine XR (EFFEXOR-XR) 150 MG 24 hr capsule TAKE 1 CAPSULE (150 MG TOTAL) BY MOUTH DAILY. (Patient taking differently: Take 150 mg by mouth daily.) 90 capsule 2   No current facility-administered medications for this visit.    Family History  Problem Relation Age of Onset   Hiatal hernia Mother    Heart attack Mother 25   Heart disease Father 89       CAD @ autopsy   Cancer Father        renal cancer   Heart attack Brother 47       fatal   Diabetes Neg Hx    Stroke Neg Hx     Review of Systems  Exam:   There were no vitals taken for this visit.    General appearance: alert, cooperative and appears stated age Head: normocephalic, without obvious abnormality, atraumatic Neck: no adenopathy, supple, symmetrical, trachea midline and thyroid normal to inspection and palpation Lungs: clear to auscultation bilaterally Breasts: normal appearance, no masses or tenderness, No nipple retraction or dimpling, No nipple discharge or bleeding, No axillary adenopathy Heart: regular rate and rhythm Abdomen: soft, non-tender; no masses, no organomegaly Extremities: extremities normal, atraumatic, no cyanosis or  edema Skin: skin color, texture, turgor normal. No rashes or lesions Lymph nodes: cervical, supraclavicular, and axillary nodes normal. Neurologic: grossly normal  Pelvic: External genitalia:  no lesions              No abnormal inguinal nodes palpated.              Urethra:  normal appearing urethra with no masses, tenderness or lesions              Bartholins and Skenes: normal                 Vagina: normal appearing vagina with normal color and discharge, no lesions  Cervix: no lesions              Pap taken: {yes no:314532} Bimanual Exam:  Uterus:  normal size, contour, position, consistency, mobility, non-tender              Adnexa: no mass, fullness, tenderness              Rectal exam: {yes no:314532}.  Confirms.              Anus:  normal sphincter tone, no lesions  Chaperone was present for exam:  ***  Assessment:   Well woman visit with gynecologic exam.   Plan: Mammogram screening discussed. Self breast awareness reviewed. Pap and HR HPV as above. Guidelines for Calcium, Vitamin D, regular exercise program including cardiovascular and weight bearing exercise.   Follow up annually and prn.   Additional counseling given.  {yes [id number]. _______ minutes face to face time of which over 50% was spent in counseling.    After visit summary provided.

## 2021-11-08 ENCOUNTER — Ambulatory Visit: Payer: MEDICARE | Admitting: Obstetrics and Gynecology

## 2021-12-11 ENCOUNTER — Ambulatory Visit (HOSPITAL_COMMUNITY): Payer: MEDICARE

## 2022-01-03 ENCOUNTER — Ambulatory Visit (HOSPITAL_COMMUNITY)
Admission: RE | Admit: 2022-01-03 | Discharge: 2022-01-03 | Disposition: A | Discharge status: Home / Self Care | Payer: MEDICARE | Source: Ambulatory Visit | Attending: Internal Medicine | Admitting: Internal Medicine

## 2022-01-03 DIAGNOSIS — R911 Solitary pulmonary nodule: Secondary | ICD-10-CM | POA: Diagnosis present

## 2022-01-22 NOTE — Progress Notes (Deleted)
Subjective:    Patient ID: Brenda Stephens, female    DOB: [birth year], 81 y.o.   MRN: [id number]  HPI F former smoker followed for allergic rhinitis, Bronchitis, complicated by HBP, GERD, Anemia, R breast cancer Walk Test 10/12/19- desat to 89% with max HR 95. Mostly sats around 94-96. 6MWT 03/28/21- 306 meters, average pace, stopping x3 for SOB, lowest O2 sat 95%, max HR 103. Cardiac cath 08/02/19- normal PFT10/26/22- she was again unable to follow directions- when to inhale and when to blow out- tech unable to complete test.  Hgb 12.7 9/13 [facility] ------------------------------------------------------------------------------------  10/24/21- 40 yoF former smoker (3 pk yrs)followed for allergic rhinitis, acute bronchitis,  complicated by HBP, GERD, Anemia, R breast cancer, Alzheimers,  -albuterol hfa, Flonase,  Covid vax-2 Phizer Flu vax-had -----Follow up. Patient says she is having trouble breathing.  She has been more aware of dyspnea on exertion with a little bit of wheezing over the last 2 months or so-chest pollen season.  Some mild cough productive of brown/yellow sputum over the last 2 weeks.  No fever or sore throat.  Trelegy had helped but too expensive. CT abdomen in March had shown a 5 mm right lower lobe nodule of concern in this former smoker with history of breast cancer.  We discussed and will take radiologist recommendation to repeat CT in 3 months. CXR 09/20/21 1V- IMPRESSION: No evidence of acute cardiopulmonary disease  01/23/22- 81 yoF former smoker (3 pk yrs)followed for allergic rhinitis, acute bronchitis,  complicated by HBP, GERD, Anemia, R breast cancer, Alzheimers,  -albuterol hfa, Flonase,  Covid vax-2 Phizer Flu vax-had  CT chest 01/03/22- IMPRESSION: 1. Stable appearance of bilateral lower lobe pulmonary nodules measuring up to 5 mm. Interval stability is reassuring for benign etiology. Continued attention on follow-up recommend in this patient with a history of  breast cancer. 2. Hepatic steatosis. 3. Aortic Atherosclerosis (ICD10-I70.0).   ROS-see HPI   + = positive Constitutional:   No-   weight loss, night sweats, fevers, chills, fatigue, lassitude. HEENT:   + headaches, difficulty swallowing, tooth/dental problems, sore throat,       No-  sneezing, itching, ear ache, nasal congestion, +post nasal drip,  CV:  No-   chest pain, orthopnea, PND, swelling in lower extremities, anasarca,  dizziness, palpitations Resp: + shortness of breath with exertion or at rest.               +productive cough,  No non-productive cough,  No- coughing up of blood.   +   change in color of mucus.  No- wheezing.   Skin: No-   rash or lesions. GI:  No-   heartburn, indigestion, abdominal pain, nausea, vomiting, GU:  MS:  No-   joint pain or swelling.  Neuro-     nothing unusual Psych:  No- change in mood or affect. No depression or anxiety.  No memory loss.    Objective:  OBJ- Physical Exam General- Alert, Oriented, Affect-appropriate, Distress- none acute, + overweight Skin- rash-none, lesions- none, excoriation- none Lymphadenopathy- none Head- atraumatic            Eyes- Gross vision intact, PERRLA, conjunctivae and secretions clear            Ears- Hearing ok,             Nose-, no-Septal dev,  polyps, erosion, perforation             Throat- Mallampati III , mucosa clear , drainage- none, tonsils-  atrophic Neck- flexible , trachea midline, no stridor , thyroid nl, carotid no bruit Chest - symmetrical excursion , unlabored           Heart/CV- RRR , no murmur , no gallop  , no rub, nl s1 s2                           - JVD- none , edema-none, stasis changes- none, varices- none           Lung- clear to P&A, wheeze- none, cough none, dullness-none, rub- none           Chest wall-  Abd- Br/ Gen/ Rectal- Not done, not indicated Extrem- cyanosis- none, clubbing, none, atrophy- none, strength- nl.  Neuro- +slight head bob Assessment & Plan:

## 2022-01-23 ENCOUNTER — Ambulatory Visit: Payer: MEDICARE | Admitting: Internal Medicine

## 2022-02-02 ENCOUNTER — Encounter: Payer: Self-pay | Admitting: Cardiology

## 2022-02-02 NOTE — Progress Notes (Deleted)
Cardiology Office Note   Date:  02/02/2022   ID:  [name], [name] 08-07-1940, MRN [id number]  PCP:  Jola Baptist, PA-C  Cardiologist:   [name], MD   No chief complaint on file.      History of Present Illness: Brenda Stephens is a 81 y.o. female who presents for evaluation of chest pain.  I saw her for palpitations in 2017 and leg swelling in 2019.  She had chest pain and had normal coronaries in 2021.    ***  ***  ***  She had.  There was a question of atrial fibrillation but I did not demonstrate this on a monitor.  Since I last saw her she occasionally gets some shooting discomfort up into her neck and down into her left arm.  This is a fleeting discomfort that happens infrequently.  She also gets occasional palpitations.  This happens every few days.  It is short-lived less than a minute.  She does not have any presyncope or syncope.  It happens at rest.  She cannot bring it on.  She does not have any new shortness of breath, PND or orthopnea.  Past Medical History:  Diagnosis Date   Allergy    latex   Anemia    Anxiety    Arthritis    Cancer (Brenda Stephens) 01/2017   right breast cancer   Depression    Dyslipidemia    Eustachian tube dysfunction    GERD (gastroesophageal reflux disease)    Hx of cardiovascular stress test    Myoview 10/13:  apical thinning, EF 72%, no ischemia   Hx of colonic polyps    Dr Watt Climes   Hypertension    Hypothyroidism    Other abnormal glucose     Past Surgical History:  Procedure Laterality Date   ABDOMINAL HYSTERECTOMY     BSO for uterine fibroids   ACHILLES TENDON SURGERY Right 10/07/2012   Procedure: RIGHT REPAIR RUPTURE TENDON PRIMARY OPEN/PERCUTANEOUS ;  Surgeon: Brenda Lights, MD;  Location: [facility];  Service: Orthopedics;  Laterality: Right;   APPENDECTOMY     BREAST LUMPECTOMY WITH RADIOACTIVE SEED AND SENTINEL LYMPH NODE BIOPSY Right 03/16/2017   Procedure: RIGHT BREAST LUMPECTOMY WITH RADIOACTIVE  SEED AND RIGHT AXILLARY SENTINEL  NODE BIOPSY ERAS PATHWAY;  Surgeon: Brenda Bookbinder, MD;  Location: [facility];  Service: General;  Laterality: Right;  PECTORAL BLOCK   CATARACT EXTRACTION, BILATERAL     CHOLECYSTECTOMY     COLONOSCOPY W/ POLYPECTOMY     Dr Watt Climes   EAR CYST EXCISION Right 10/07/2012   Procedure: EXCISION BONE CYST BENIGN TUMOR [name] ;  Surgeon: Brenda Lights, MD;  Location: [facility];  Service: Orthopedics;  Laterality: Right;   EXPLORATORY LAPAROTOMY     LEFT HEART CATH AND CORONARY ANGIOGRAPHY N/A 08/02/2019   Procedure: LEFT HEART CATH AND CORONARY ANGIOGRAPHY;  Surgeon: Martinique, Peter M, MD;  Location: [facility];  Service: Cardiovascular;  Laterality: N/A;   REVISION TOTAL SHOULDER TO REVERSE TOTAL SHOULDER Left 01/12/2019   Procedure: REVISION TOTAL SHOULDER TO REVERSE TOTAL SHOULDER;  Surgeon: Brenda Gash, MD;  Location: [facility];  Service: Orthopedics;  Laterality: Left;   SINUS SURGERY WITH INSTATRAK     TOTAL SHOULDER REPLACEMENT     Right shoulder   TOTAL SHOULDER REPLACEMENT  05/2010   Left   TUBAL LIGATION      Current Outpatient Medications  Medication  Sig Dispense Refill   acetaminophen (TYLENOL) 500 MG tablet Take 1,000 mg by mouth 2 (two) times daily as needed for moderate pain.     atorvastatin (LIPITOR) 20 MG tablet TAKE 1 TABLET BY MOUTH AT BEDTIME (Patient taking differently: Take 20 mg by mouth at bedtime.) 90 tablet 1   doxycycline (VIBRA-TABS) 100 MG tablet Take 1 tablet (100 mg total) by mouth 2 (two) times daily. 14 tablet 0   esomeprazole (NEXIUM) 20 MG capsule Take 20 mg by mouth at bedtime.     fluticasone (FLONASE) 50 MCG/ACT nasal spray Place 2 sprays into both nostrils daily as needed for allergies or rhinitis.      halobetasol (ULTRAVATE) 0.05 % cream Apply topically daily. 50 g 3   ketoconazole (NIZORAL) 2 % cream Apply 1 application topically daily. 15 g 2   meclizine (ANTIVERT) 25 MG  tablet Take 1 tablet (25 mg total) by mouth 3 (three) times daily as needed for dizziness. 30 tablet 0   nitroGLYCERIN (NITROSTAT) 0.4 MG SL tablet Place 1 tablet (0.4 mg total) under the tongue every 5 (five) minutes as needed for chest pain. 25 tablet 3   nystatin (MYCOSTATIN/NYSTOP) powder Apply 1 application topically 2 (two) times daily. 15 g 3   sennosides-docusate sodium (SENOKOT-S) 8.6-50 MG tablet Take 2 tablets by mouth daily as needed for constipation.     spironolactone-hydrochlorothiazide (ALDACTAZIDE) 25-25 MG per tablet Take 1 tablet by mouth daily. 90 tablet 3   SYNTHROID 50 MCG tablet PT NEEDS COMPLETE PHYSICAL.  TAKE 1 TABLET (50MCG TOTAL) BY MOUTH DAILY. (Patient taking differently: Take 50 mcg by mouth daily.) 30 tablet 0   tamoxifen (NOLVADEX) 20 MG tablet Take 1 tablet (20 mg total) by mouth daily. 90 tablet 4   venlafaxine XR (EFFEXOR-XR) 150 MG 24 hr capsule TAKE 1 CAPSULE (150 MG TOTAL) BY MOUTH DAILY. (Patient taking differently: Take 150 mg by mouth daily.) 90 capsule 2   No current facility-administered medications for this visit.    Allergies:   Latex, Aspirin, Oxycodone-acetaminophen, Sulfa antibiotics, and Sulfamethoxazole-trimethoprim    ROS:  Please see the history of present illness.   Otherwise, review of systems are positive for ***.   All other systems are reviewed and negative.    PHYSICAL EXAM: VS:  There were no vitals taken for this visit. , BMI There is no height or weight on file to calculate BMI.  GENERAL:  Well appearing NECK:  No jugular venous distention, waveform within normal limits, carotid upstroke brisk and symmetric, no bruits, no thyromegaly LUNGS:  Clear to auscultation bilaterally CHEST:  Unremarkable HEART:  PMI not displaced or sustained,S1 and S2 within normal limits, no S3, no S4, no clicks, no rubs, *** murmurs ABD:  Flat, positive bowel sounds normal in frequency in pitch, no bruits, no rebound, no guarding, no midline pulsatile  mass, no hepatomegaly, no splenomegaly EXT:  2 plus pulses throughout, no edema, no cyanosis no clubbing    ***GENERAL:  Well appearing NECK:  No jugular venous distention, waveform within normal limits, carotid upstroke brisk and symmetric, no bruits, no thyromegaly LUNGS:  Clear to auscultation bilaterally CHEST:  Unremarkable HEART:  PMI not displaced or sustained,S1 and S2 within normal limits, no S3, no S4, no clicks, no rubs, no murmurs ABD:  Flat, positive bowel sounds normal in frequency in pitch, no bruits, no rebound, no guarding, no midline pulsatile mass, no hepatomegaly, no splenomegaly EXT:  2 plus pulses throughout, no edema, no cyanosis no  clubbing  EKG:  EKG is *** ordered today. Sinus rhythm, rate ***, axis within normal limits, early transition lead V2, low voltage in the chest leads, QT prolonged, premature atrial contractions.  PACs.    Recent Labs: 09/20/2021: ALT 22; BUN 16; Creatinine, Ser 1.41; Hemoglobin 13.2; Platelets 218; Potassium 4.1; Sodium 136    Lipid Panel    Component Value Date/Time   CHOL 208 (H) 07/19/2012 1520   TRIG 152.0 (H) 07/19/2012 1520   HDL 70.30 07/19/2012 1520   CHOLHDL 3 07/19/2012 1520   VLDL 30.4 07/19/2012 1520   LDLCALC 91 06/13/2011 1134   LDLDIRECT 108.4 07/19/2012 1520      Wt Readings from Last 3 Encounters:  10/24/21 187 lb 3.2 oz (84.9 kg)  10/10/21 187 lb (84.8 kg)  09/20/21 185 lb 6.5 oz (84.1 kg)      Other studies Reviewed: Additional studies/ records that were reviewed today include: *** Review of the above records demonstrates: ***   ASSESSMENT AND PLAN:   PALPITATIONS:    ***  Her rhythm is again today NSR with frequent and multiform PACs.  I don't see atrial fib.  We had a long discussion about this.  She is not particularly symptomatic.  No change in therapy.  She opts not to have another medication.  She is only is symptomatic if she is.  She would let me know if she wants to reconsider this.  HTN:     Her blood pressure is *** elevated and she is going to keep a blood pressure diary.  I would go up on her Aldactazide if her home blood pressure readings are above target.    Current medicines are reviewed at length with the patient today.  The patient does not have concerns regarding medicines.  The following changes have been made:  ***  Labs/ tests ordered today include: ***  No orders of the defined types were placed in this encounter.      Disposition:   FU with me ***   Signed, [name], MD  02/02/2022 8:47 PM    [place]

## 2022-02-03 ENCOUNTER — Ambulatory Visit: Payer: MEDICARE | Admitting: Cardiology

## 2022-02-03 DIAGNOSIS — I10 Essential (primary) hypertension: Secondary | ICD-10-CM

## 2022-02-03 DIAGNOSIS — R002 Palpitations: Secondary | ICD-10-CM

## 2022-02-19 NOTE — Progress Notes (Signed)
Subjective:    Patient ID: Brenda Stephens, female    DOB: 11-02-1940, 81 y.o.   MRN: [id number]  HPI F former smoker followed for allergic rhinitis, Bronchitis, complicated by HBP, GERD, Anemia, R breast cancer Walk Test 10/12/19- desat to 89% with max HR 95. Mostly sats around 94-96. 6MWT 03/28/21- 306 meters, average pace, stopping x3 for SOB, lowest O2 sat 95%, max HR 103. Cardiac cath 08/02/19- normal PFT10/26/22- she was again unable to follow directions- when to inhale and when to blow out- tech unable to complete test.  Hgb 12.7 9/13 [facility] ------------------------------------------------------------------------------------   10/24/21- 26 yoF former smoker (3 pk yrs)followed for allergic rhinitis, acute bronchitis,  complicated by HBP, GERD, Anemia, R breast cancer, Alzheimers,  -albuterol hfa, Flonase,  Covid vax-2 Phizer Flu vax-had -----Follow up. Patient says she is having trouble breathing.  She has been more aware of dyspnea on exertion with a little bit of wheezing over the last 2 months or so-chest pollen season.  Some mild cough productive of brown/yellow sputum over the last 2 weeks.  No fever or sore throat.  Trelegy had helped but too expensive. CT abdomen in March had shown a 5 mm right lower lobe nodule of concern in this former smoker with history of breast cancer.  We discussed and will take radiologist recommendation to repeat CT in 3 months. CXR 09/20/21 1V- IMPRESSION: No evidence of acute cardiopulmonary disease  02/21/22- 81 yoF former smoker (3 pk yrs)followed for allergic Rhinitis, acute Bronchitis,  Lung Nodule, complicated by HBP, GERD, Anemia, R breast cancer, Alzheimers,  -albuterol hfa ?, Flonase,  Covid vax-2 Phizer ------Pt f/u she is having increased SOB w/ any weight bearing activities and exertion. Daughter is here and helpful with questions.  Note that patient was unable to follow instructions for PFT in 2022, a common indicator of early dementia. She  says she still notices dyspnea on exertion and a feeling that she is heavy on her feet during ADLs at home.  She denies any acute change, obvious progression, wheeze or cough.  She had tried rescue inhaler a few times without recognizing any benefit.  "Feet always swell". Daughter says neurologist had recommended physical therapy to help with stamina and balance.  Daughter asks referral to a physical therapist at [facility] in [place]. CT discussed with plan to repeat for follow-up of nodules in 1 year. CT chest 01/03/22- IMPRESSION: 1. Stable appearance of bilateral lower lobe pulmonary nodules measuring up to 5 mm. Interval stability is reassuring for benign etiology. Continued attention on follow-up recommend in this patient with a history of breast cancer. 2. Hepatic steatosis. 3. Aortic Atherosclerosis (ICD10-I70.0).  ROS-see HPI   + = positive Constitutional:   No-   weight loss, night sweats, fevers, chills, fatigue, lassitude. HEENT:   + headaches, difficulty swallowing, tooth/dental problems, sore throat,       No-  sneezing, itching, ear ache, nasal congestion, +post nasal drip,  CV:  No-   chest pain, orthopnea, PND, swelling in lower extremities, anasarca,  dizziness, palpitations Resp: + shortness of breath with exertion or at rest.               +productive cough,  No non-productive cough,  No- coughing up of blood.   +   change in color of mucus.  No- wheezing.   Skin: No-   rash or lesions. GI:  No-   heartburn, indigestion, abdominal pain, nausea, vomiting, GU:  MS:  No-   joint pain  or swelling.  Neuro-     nothing unusual Psych:  No- change in mood or affect. No depression or anxiety.  No memory loss.    Objective:  OBJ- Physical Exam General- Alert, Oriented, Affect-appropriate, Distress- none acute, + overweight Skin- rash-none, lesions- none, excoriation- none Lymphadenopathy- none Head- atraumatic            Eyes- Gross vision intact, PERRLA, conjunctivae and  secretions clear            Ears- Hearing ok,             Nose-, no-Septal dev,  polyps, erosion, perforation             Throat- Mallampati III , mucosa clear , drainage- none, tonsils- atrophic Neck- flexible , trachea midline, no stridor , thyroid nl, carotid no bruit Chest - symmetrical excursion , unlabored           Heart/CV- RRR , no murmur , no gallop  , no rub, nl s1 s2                           - JVD- none , edema+1, stasis changes- none, varices- none           Lung- + fine rales, unlabored, wheeze- none, cough none, dullness-none, rub- none           Chest wall-  Abd- Br/ Gen/ Rectal- Not done, not indicated Extrem- cyanosis- none, clubbing, none, atrophy- none, strength- nl.  Neuro- +slight head bob Assessment & Plan:

## 2022-02-21 ENCOUNTER — Encounter: Payer: Self-pay | Admitting: Internal Medicine

## 2022-02-21 ENCOUNTER — Ambulatory Visit (INDEPENDENT_AMBULATORY_CARE_PROVIDER_SITE_OTHER): Payer: MEDICARE | Admitting: Internal Medicine

## 2022-02-21 VITALS — BP 126/80 | HR 90 | Ht 64.0 in | Wt 185.4 lb

## 2022-02-21 DIAGNOSIS — Z853 Personal history of malignant neoplasm of breast: Secondary | ICD-10-CM | POA: Diagnosis not present

## 2022-02-21 DIAGNOSIS — R0609 Other forms of dyspnea: Secondary | ICD-10-CM

## 2022-02-21 DIAGNOSIS — R42 Dizziness and giddiness: Secondary | ICD-10-CM | POA: Diagnosis not present

## 2022-02-21 DIAGNOSIS — R911 Solitary pulmonary nodule: Secondary | ICD-10-CM | POA: Diagnosis not present

## 2022-02-21 DIAGNOSIS — R079 Chest pain, unspecified: Secondary | ICD-10-CM

## 2022-02-21 LAB — CBC WITH DIFFERENTIAL/PLATELET
Basophils Absolute: 0.1 10*3/uL (ref 0.0–0.1)
Basophils Relative: 0.9 % (ref 0.0–3.0)
Eosinophils Absolute: 0.2 10*3/uL (ref 0.0–0.7)
Eosinophils Relative: 3.7 % (ref 0.0–5.0)
HCT: 39.6 % (ref 36.0–46.0)
Hemoglobin: 13 g/dL (ref 12.0–15.0)
Lymphocytes Relative: 26.3 % (ref 12.0–46.0)
Lymphs Abs: 1.5 10*3/uL (ref 0.7–4.0)
MCHC: 32.9 g/dL (ref 30.0–36.0)
MCV: 91.2 fl (ref 78.0–100.0)
Monocytes Absolute: 0.7 10*3/uL (ref 0.1–1.0)
Monocytes Relative: 12.1 % — ABNORMAL HIGH (ref 3.0–12.0)
Neutro Abs: 3.3 10*3/uL (ref 1.4–7.7)
Neutrophils Relative %: 57 % (ref 43.0–77.0)
Platelets: 210 10*3/uL (ref 150.0–400.0)
RBC: 4.34 Mil/uL (ref 3.87–5.11)
RDW: 13.1 % (ref 11.5–15.5)
WBC: 5.7 10*3/uL (ref 4.0–10.5)

## 2022-02-21 LAB — BASIC METABOLIC PANEL
BUN: 15 mg/dL (ref 6–23)
CO2: 25 mEq/L (ref 19–32)
Calcium: 9.3 mg/dL (ref 8.4–10.5)
Chloride: 104 mEq/L (ref 96–112)
Creatinine, Ser: 1.04 mg/dL (ref 0.40–1.20)
GFR: 50.76 mL/min — ABNORMAL LOW (ref >60.00)
Glucose, Bld: 110 mg/dL — ABNORMAL HIGH (ref 70–99)
Potassium: 4 mEq/L (ref 3.5–5.1)
Sodium: 137 mEq/L (ref 135–145)

## 2022-02-21 LAB — BRAIN NATRIURETIC PEPTIDE: Pro B Natriuretic peptide (BNP): 35 pg/mL (ref 0.0–100.0)

## 2022-02-21 LAB — D-DIMER, QUANTITATIVE: D-Dimer, Quant: 1.23 mcg/mL FEU — ABNORMAL HIGH (ref <0.50)

## 2022-02-21 NOTE — Assessment & Plan Note (Signed)
Granulomas noted back to 2015. Plan-update CT scan in 1 year

## 2022-02-21 NOTE — Patient Instructions (Addendum)
Order- lab- CBC w diff, BMET, BNP, D-dimer     dx dyspnea on exertion  Order- schedule CT chest no contrast- future in 1 year      dx lung nodules  Refer to Physical Therapy:  [facility]                       For strength and balance   Dx hx breast cancer, debilitation, dyspnea on exertion

## 2022-02-21 NOTE — Assessment & Plan Note (Signed)
No definite change.  Bronchitis symptoms with cough and sputum not apparent. Plan-CBC with differential, chemistry, BNP, d-dimer

## 2022-02-21 NOTE — Assessment & Plan Note (Signed)
She had been advised to see physical therapy for strength and balance.  Daughter requests referral to physical therapy in [place] with [facility].

## 2022-02-24 ENCOUNTER — Telehealth: Payer: Self-pay | Admitting: Internal Medicine

## 2022-02-24 ENCOUNTER — Ambulatory Visit (HOSPITAL_COMMUNITY): Payer: MEDICARE

## 2022-02-24 NOTE — Telephone Encounter (Signed)
Order placed for STAT CTa . Mychart message sent to pt's daughter about this. Nothing further needed.

## 2022-02-24 NOTE — Telephone Encounter (Signed)
Mychart message sent by pt's daughter: Brenda Stephens [facility] (supporting Deneise Lever, MD) 3 days ago    Good evening, this is Brenda Stephens daughter Brenda Stephens.  Brenda Stephens been reviewing Mom's lab results and have a concern about her 1.23 D-Dimer level and possible next steps to take?! I was reading some symptoms and along with feet swelling, horrible shortness breath (if even walk 20 ft), and fatigue, Mom also has pain in legs with touch (cats walking over them, me checking for edema) & nonproductive cough (worse in morning- claims dry).  She's only out and doing via car & foot if she has appt or lunch with friend/me, otherwise in bed or chair.  Sleeps about 1am-1pm, then naps about 4-7pm! No exercise- won't do stationary bike in house.  In remission breast cancer, plus sedimentary life. Very worried!! Please let me know what to do next!  I think she's overdue for seeing new oncologist (Dr. Jana Hakim retired) & possibly mammogram.  I'm not currently working so can follow-up & take to all appts! My cell is [phone], Mom: [phone].  Thank you very much & stay well, Brenda Stephens      Dr. Annamaria Boots, please advise what you recommend.

## 2022-02-24 NOTE — Telephone Encounter (Signed)
We had placed  CT order 8/25.  That needs to be changed to ASAP order for CTa chest PE protocol please   dx abnormal D-dimer.

## 2022-02-25 ENCOUNTER — Ambulatory Visit (HOSPITAL_COMMUNITY)
Admission: RE | Admit: 2022-02-25 | Discharge: 2022-02-25 | Disposition: A | Discharge status: Home / Self Care | Payer: MEDICARE | Source: Ambulatory Visit | Attending: Internal Medicine | Admitting: Internal Medicine

## 2022-02-25 DIAGNOSIS — R079 Chest pain, unspecified: Secondary | ICD-10-CM | POA: Diagnosis present

## 2022-02-25 MED ORDER — IOHEXOL 350 MG/ML SOLN
100.0000 mL | Freq: Once | INTRAVENOUS | Status: AC | PRN
  Administered 2022-02-25: 100 mL via INTRAVENOUS

## 2022-02-25 MED ORDER — SODIUM CHLORIDE (PF) 0.9 % IJ SOLN
INTRAMUSCULAR | Status: AC
  Filled 2022-02-25: qty 50

## 2022-02-25 NOTE — Telephone Encounter (Signed)
Pt had CT this morning.  Nothing further needed at this time.

## 2022-02-27 ENCOUNTER — Other Ambulatory Visit: Payer: Self-pay | Admitting: Internal Medicine

## 2022-02-27 NOTE — Progress Notes (Signed)
Cardiology Office Note   Date:  02/28/2022   ID:  Sonda, Coppens 29-Sep-1940, MRN [id number]  PCP:  Jola Baptist, PA-C  Cardiologist:   [name], MD   Chief Complaint  Patient presents with   Palpitations   Shortness of Breath       History of Present Illness: Brenda Stephens is a 81 y.o. female who presents for evaluation of chest pain.  I saw her for palpitations in 2017 and leg swelling in 2019.  She had chest pain and had normal coronaries in 2021.   She was in the emergency room earlier this year but there were no significant arrhythmias noted and no further testing was indicated.  She was seen in our office in follow-up.     She is getting dyspneic with exertion which has been a chronic problem.  She is very sedentary and does not move much because of joint pains and because of her dyspnea.  Her daughter says she is chair to bed and around the house a little bit.  She has some mild lower extremity swelling but this is unchanged.  She had a CT recently and was referred back to me because of this.  This suggested aortic atherosclerosis and coronary artery atherosclerosis.  Her aorta was 40 mm.  She previously had catheterization with no obstructive coronary disease.  She is not describing PND or orthopnea.  She had palpitations as described but no presyncope or syncope.  Her history is complicated by some dementia.  She is cared for by her daughter.   Past Medical History:  Diagnosis Date   Allergy    latex   Anemia    Anxiety    Arthritis    Cancer ([place]) 01/2017   right breast cancer   Depression    Dyslipidemia    Eustachian tube dysfunction    GERD (gastroesophageal reflux disease)    Hx of cardiovascular stress test    Myoview 10/13:  apical thinning, EF 72%, no ischemia   Hx of colonic polyps    Dr Watt Climes   Hypertension    Hypothyroidism    Other abnormal glucose     Past Surgical History:  Procedure Laterality Date   ABDOMINAL HYSTERECTOMY      BSO for uterine fibroids   ACHILLES TENDON SURGERY Right 10/07/2012   Procedure: RIGHT REPAIR RUPTURE TENDON PRIMARY OPEN/PERCUTANEOUS ;  Surgeon: Ninetta Lights, MD;  Location: [facility];  Service: Orthopedics;  Laterality: Right;   APPENDECTOMY     BREAST LUMPECTOMY WITH RADIOACTIVE SEED AND SENTINEL LYMPH NODE BIOPSY Right 03/16/2017   Procedure: RIGHT BREAST LUMPECTOMY WITH RADIOACTIVE SEED AND RIGHT AXILLARY SENTINEL  NODE BIOPSY ERAS PATHWAY;  Surgeon: [name], MD;  Location: [facility];  Service: General;  Laterality: Right;  PECTORAL BLOCK   CATARACT EXTRACTION, BILATERAL     CHOLECYSTECTOMY     COLONOSCOPY W/ POLYPECTOMY     Dr Watt Climes   EAR CYST EXCISION Right 10/07/2012   Procedure: EXCISION BONE CYST BENIGN TUMOR [name] ;  Surgeon: Ninetta Lights, MD;  Location: [facility];  Service: Orthopedics;  Laterality: Right;   EXPLORATORY LAPAROTOMY     LEFT HEART CATH AND CORONARY ANGIOGRAPHY N/A 08/02/2019   Procedure: LEFT HEART CATH AND CORONARY ANGIOGRAPHY;  Surgeon: Martinique, Peter M, MD;  Location: [facility];  Service: Cardiovascular;  Laterality: N/A;   REVISION TOTAL SHOULDER TO REVERSE TOTAL  SHOULDER Left 01/12/2019   Procedure: REVISION TOTAL SHOULDER TO REVERSE TOTAL SHOULDER;  Surgeon: Hiram Gash, MD;  Location: [facility];  Service: Orthopedics;  Laterality: Left;   SINUS SURGERY WITH INSTATRAK     TOTAL SHOULDER REPLACEMENT     Right shoulder   TOTAL SHOULDER REPLACEMENT  05/2010   Left   TUBAL LIGATION      Current Outpatient Medications  Medication Sig Dispense Refill   acetaminophen (TYLENOL) 500 MG tablet Take 1,000 mg by mouth 2 (two) times daily as needed for moderate pain.     atorvastatin (LIPITOR) 20 MG tablet TAKE 1 TABLET BY MOUTH AT BEDTIME (Patient taking differently: Take 20 mg by mouth at bedtime.) 90 tablet 1   dicyclomine (BENTYL) 20 MG tablet Take by mouth.     donepezil (ARICEPT)  10 MG tablet Take 10 mg by mouth at bedtime.     doxycycline (VIBRA-TABS) 100 MG tablet Take 1 tablet (100 mg total) by mouth 2 (two) times daily. 14 tablet 0   esomeprazole (NEXIUM) 20 MG capsule Take 20 mg by mouth at bedtime.     fluticasone (FLONASE) 50 MCG/ACT nasal spray Place 2 sprays into both nostrils daily as needed for allergies or rhinitis.      halobetasol (ULTRAVATE) 0.05 % cream Apply topically daily. 50 g 3   ketoconazole (NIZORAL) 2 % cream Apply 1 application topically daily. 15 g 2   meclizine (ANTIVERT) 25 MG tablet Take 1 tablet (25 mg total) by mouth 3 (three) times daily as needed for dizziness. 30 tablet 0   memantine (NAMENDA) 10 MG tablet Take 10 mg by mouth 2 (two) times daily.     nystatin (MYCOSTATIN/NYSTOP) powder Apply 1 application topically 2 (two) times daily. 15 g 3   sennosides-docusate sodium (SENOKOT-S) 8.6-50 MG tablet Take 2 tablets by mouth daily as needed for constipation.     SYNTHROID 50 MCG tablet PT NEEDS COMPLETE PHYSICAL.  TAKE 1 TABLET (50MCG TOTAL) BY MOUTH DAILY. (Patient taking differently: Take 50 mcg by mouth daily.) 30 tablet 0   tamoxifen (NOLVADEX) 20 MG tablet Take 1 tablet (20 mg total) by mouth daily. 90 tablet 4   venlafaxine XR (EFFEXOR-XR) 150 MG 24 hr capsule TAKE 1 CAPSULE (150 MG TOTAL) BY MOUTH DAILY. (Patient taking differently: Take 150 mg by mouth daily.) 90 capsule 2   nitroGLYCERIN (NITROSTAT) 0.4 MG SL tablet Place 1 tablet (0.4 mg total) under the tongue every 5 (five) minutes as needed for chest pain. 25 tablet 3   No current facility-administered medications for this visit.    Allergies:   Latex, Aspirin, Oxycodone-acetaminophen, Sulfa antibiotics, and Sulfamethoxazole-trimethoprim    ROS:  Please see the history of present illness.   Otherwise, review of systems are positive for none.   All other systems are reviewed and negative.    PHYSICAL EXAM: VS:  BP 115/60   Pulse 90   Ht '5\' 3"'$  (1.6 m)   Wt 185 lb 3.2 oz  (84 kg)   SpO2 97%   BMI 32.81 kg/m  , BMI Body mass index is 32.81 kg/m.  GENERAL:  Well appearing NECK:  No jugular venous distention, waveform within normal limits, carotid upstroke brisk and symmetric, no bruits, no thyromegaly LUNGS:  Clear to auscultation bilaterally CHEST:  Unremarkable HEART:  PMI not displaced or sustained,S1 and S2 within normal limits, no S3, no S4, no clicks, no rubs, no murmurs ABD:  Flat, positive bowel sounds normal in frequency in  pitch, no bruits, no rebound, no guarding, no midline pulsatile mass, no hepatomegaly, no splenomegaly EXT:  2 plus pulses throughout, no edema, no cyanosis no clubbing  EKG:  EKG is not ordered today.     Recent Labs: 09/20/2021: ALT 22 02/21/2022: BUN 15; Creatinine, Ser 1.04; Hemoglobin 13.0; Platelets 210.0; Potassium 4.0; Pro B Natriuretic peptide (BNP) 35.0; Sodium 137    Lipid Panel    Component Value Date/Time   CHOL 208 (H) 07/19/2012 1520   TRIG 152.0 (H) 07/19/2012 1520   HDL 70.30 07/19/2012 1520   CHOLHDL 3 07/19/2012 1520   VLDL 30.4 07/19/2012 1520   LDLCALC 91 06/13/2011 1134   LDLDIRECT 108.4 07/19/2012 1520      Wt Readings from Last 3 Encounters:  02/28/22 185 lb 3.2 oz (84 kg)  02/21/22 185 lb 6.4 oz (84.1 kg)  10/24/21 187 lb 3.2 oz (84.9 kg)      Other studies Reviewed: Additional studies/ records that were reviewed today include: CT and labs Review of the above records demonstrates: See elsewhere   ASSESSMENT AND PLAN:   PALPITATIONS:    These are not particular problematic.  No change in therapy.  HTN:    Her blood pressure is at target.  No change in therapy.    SOB: I reviewed Dr. Janee Morn notes.  He does not seem to be a pulmonary etiology although they were unable to do pulmonary function testing because of her dementia.  I do not think she is volume overloaded.  Her BNP previously was normal.  She had no obstructive coronary disease.  Walking around the office today her  saturations were 97.  At this point I do not see an indication for other testing.  I have suggested some chair aerobics and Dr. Annamaria Boots has prescribed physical therapy and I would agree with this.  AORTIC ROOT ENLARGEMENT: This was 40 mm.  We will consider reimaging in 1 year.    Current medicines are reviewed at length with the patient today.  The patient does not have concerns regarding medicines.  The following changes have been made:  None  Labs/ tests ordered today include: None  No orders of the defined types were placed in this encounter.      Disposition:   FU with me in one year.    Signed, [name], MD  02/28/2022 9:14 AM    [facility]

## 2022-02-28 ENCOUNTER — Encounter: Payer: Self-pay | Admitting: Cardiology

## 2022-02-28 ENCOUNTER — Ambulatory Visit: Payer: MEDICARE | Attending: Cardiology | Admitting: Cardiology

## 2022-02-28 VITALS — BP 115/60 | HR 90 | Ht 63.0 in | Wt 185.2 lb

## 2022-02-28 DIAGNOSIS — R072 Precordial pain: Secondary | ICD-10-CM | POA: Diagnosis not present

## 2022-02-28 DIAGNOSIS — I10 Essential (primary) hypertension: Secondary | ICD-10-CM | POA: Diagnosis not present

## 2022-02-28 DIAGNOSIS — R002 Palpitations: Secondary | ICD-10-CM

## 2022-02-28 NOTE — Patient Instructions (Signed)
Medication Instructions:  HOLD the Lipitor (Atorvastatin) for one month. Please let us know after the month how you feel.   *If you need a refill on your cardiac medications before your next appointment, please call your pharmacy*   Lab Work: None ordered If you have labs (blood work) drawn today and your tests are completely normal, you will receive your results only by: Centre (if you have MyChart) OR A paper copy in the mail If you have any lab test that is abnormal or we need to change your treatment, we will call you to review the results.   Testing/Procedures: None ordered   Follow-Up: At [facility], you and your health needs are our priority.  As part of our continuing mission to provide you with exceptional heart care, we have created designated Provider Care Teams.  These Care Teams include your primary Cardiologist (physician) and Advanced Practice Providers (APPs -  Physician Assistants and Nurse Practitioners) who all work together to provide you with the care you need, when you need it.  We recommend signing up for the patient portal called "MyChart".  Sign up information is provided on this After Visit Summary.  MyChart is used to connect with patients for Virtual Visits (Telemedicine).  Patients are able to view lab/test results, encounter notes, upcoming appointments, etc.  Non-urgent messages can be sent to your provider as well.   To learn more about what you can do with MyChart, go to [email or web address].    Your next appointment:   12 month(s)  The format for your next appointment:   In Person  Provider:   [name], MD      Important Information About Sugar

## 2022-04-01 ENCOUNTER — Ambulatory Visit: Payer: MEDICARE | Admitting: Internal Medicine

## 2022-04-23 DIAGNOSIS — N1831 Chronic kidney disease, stage 3a: Secondary | ICD-10-CM | POA: Diagnosis present

## 2022-05-12 ENCOUNTER — Ambulatory Visit: Payer: MEDICARE | Admitting: Internal Medicine

## 2022-06-30 ENCOUNTER — Inpatient Hospital Stay (HOSPITAL_COMMUNITY): Payer: MEDICARE

## 2022-06-30 ENCOUNTER — Other Ambulatory Visit: Payer: Self-pay

## 2022-06-30 ENCOUNTER — Emergency Department (HOSPITAL_COMMUNITY): Payer: MEDICARE

## 2022-06-30 ENCOUNTER — Encounter (HOSPITAL_COMMUNITY): Payer: Self-pay

## 2022-06-30 ENCOUNTER — Inpatient Hospital Stay (HOSPITAL_COMMUNITY)
Admission: EM | Admit: 2022-06-30 | Discharge: 2022-07-05 | DRG: 176 | Disposition: A | Discharge status: Home Health | Payer: MEDICARE | Attending: Internal Medicine | Admitting: Internal Medicine

## 2022-06-30 DIAGNOSIS — F0393 Unspecified dementia, unspecified severity, with mood disturbance: Secondary | ICD-10-CM | POA: Diagnosis present

## 2022-06-30 DIAGNOSIS — Z87891 Personal history of nicotine dependence: Secondary | ICD-10-CM | POA: Diagnosis not present

## 2022-06-30 DIAGNOSIS — Z8249 Family history of ischemic heart disease and other diseases of the circulatory system: Secondary | ICD-10-CM

## 2022-06-30 DIAGNOSIS — K219 Gastro-esophageal reflux disease without esophagitis: Secondary | ICD-10-CM | POA: Diagnosis present

## 2022-06-30 DIAGNOSIS — Z96612 Presence of left artificial shoulder joint: Secondary | ICD-10-CM | POA: Diagnosis present

## 2022-06-30 DIAGNOSIS — F419 Anxiety disorder, unspecified: Secondary | ICD-10-CM | POA: Diagnosis present

## 2022-06-30 DIAGNOSIS — C50411 Malignant neoplasm of upper-outer quadrant of right female breast: Secondary | ICD-10-CM | POA: Diagnosis present

## 2022-06-30 DIAGNOSIS — F32A Depression, unspecified: Secondary | ICD-10-CM | POA: Diagnosis present

## 2022-06-30 DIAGNOSIS — Z86711 Personal history of pulmonary embolism: Secondary | ICD-10-CM

## 2022-06-30 DIAGNOSIS — I2609 Other pulmonary embolism with acute cor pulmonale: Secondary | ICD-10-CM

## 2022-06-30 DIAGNOSIS — R110 Nausea: Secondary | ICD-10-CM | POA: Diagnosis present

## 2022-06-30 DIAGNOSIS — R0902 Hypoxemia: Secondary | ICD-10-CM | POA: Diagnosis present

## 2022-06-30 DIAGNOSIS — I10 Essential (primary) hypertension: Secondary | ICD-10-CM | POA: Diagnosis present

## 2022-06-30 DIAGNOSIS — Z9981 Dependence on supplemental oxygen: Secondary | ICD-10-CM

## 2022-06-30 DIAGNOSIS — E039 Hypothyroidism, unspecified: Secondary | ICD-10-CM | POA: Diagnosis present

## 2022-06-30 DIAGNOSIS — Z9851 Tubal ligation status: Secondary | ICD-10-CM

## 2022-06-30 DIAGNOSIS — J9621 Acute and chronic respiratory failure with hypoxia: Secondary | ICD-10-CM | POA: Diagnosis not present

## 2022-06-30 DIAGNOSIS — I2699 Other pulmonary embolism without acute cor pulmonale: Principal | ICD-10-CM | POA: Diagnosis present

## 2022-06-30 DIAGNOSIS — E669 Obesity, unspecified: Secondary | ICD-10-CM | POA: Diagnosis present

## 2022-06-30 DIAGNOSIS — F0394 Unspecified dementia, unspecified severity, with anxiety: Secondary | ICD-10-CM | POA: Diagnosis present

## 2022-06-30 DIAGNOSIS — Z853 Personal history of malignant neoplasm of breast: Secondary | ICD-10-CM

## 2022-06-30 DIAGNOSIS — E785 Hyperlipidemia, unspecified: Secondary | ICD-10-CM | POA: Diagnosis present

## 2022-06-30 DIAGNOSIS — F0390 Unspecified dementia without behavioral disturbance: Secondary | ICD-10-CM | POA: Diagnosis not present

## 2022-06-30 DIAGNOSIS — K760 Fatty (change of) liver, not elsewhere classified: Secondary | ICD-10-CM | POA: Diagnosis present

## 2022-06-30 DIAGNOSIS — I824Y1 Acute embolism and thrombosis of unspecified deep veins of right proximal lower extremity: Secondary | ICD-10-CM | POA: Diagnosis present

## 2022-06-30 DIAGNOSIS — Z79810 Long term (current) use of selective estrogen receptor modulators (SERMs): Secondary | ICD-10-CM | POA: Diagnosis not present

## 2022-06-30 DIAGNOSIS — R0609 Other forms of dyspnea: Secondary | ICD-10-CM

## 2022-06-30 DIAGNOSIS — R7989 Other specified abnormal findings of blood chemistry: Secondary | ICD-10-CM | POA: Diagnosis present

## 2022-06-30 DIAGNOSIS — Z1152 Encounter for screening for COVID-19: Secondary | ICD-10-CM

## 2022-06-30 DIAGNOSIS — Z86010 Personal history of colonic polyps: Secondary | ICD-10-CM

## 2022-06-30 DIAGNOSIS — Z91040 Latex allergy status: Secondary | ICD-10-CM

## 2022-06-30 DIAGNOSIS — Z9049 Acquired absence of other specified parts of digestive tract: Secondary | ICD-10-CM

## 2022-06-30 DIAGNOSIS — Z79899 Other long term (current) drug therapy: Secondary | ICD-10-CM | POA: Diagnosis not present

## 2022-06-30 DIAGNOSIS — Z170 Estrogen receptor positive status [ER+]: Secondary | ICD-10-CM

## 2022-06-30 DIAGNOSIS — Z9842 Cataract extraction status, left eye: Secondary | ICD-10-CM

## 2022-06-30 DIAGNOSIS — Z9841 Cataract extraction status, right eye: Secondary | ICD-10-CM

## 2022-06-30 DIAGNOSIS — R739 Hyperglycemia, unspecified: Secondary | ICD-10-CM | POA: Diagnosis present

## 2022-06-30 DIAGNOSIS — Z885 Allergy status to narcotic agent status: Secondary | ICD-10-CM

## 2022-06-30 DIAGNOSIS — Z86718 Personal history of other venous thrombosis and embolism: Secondary | ICD-10-CM

## 2022-06-30 DIAGNOSIS — Z96611 Presence of right artificial shoulder joint: Secondary | ICD-10-CM | POA: Diagnosis present

## 2022-06-30 DIAGNOSIS — Z886 Allergy status to analgesic agent status: Secondary | ICD-10-CM

## 2022-06-30 DIAGNOSIS — Z6837 Body mass index (BMI) 37.0-37.9, adult: Secondary | ICD-10-CM

## 2022-06-30 DIAGNOSIS — Z90710 Acquired absence of both cervix and uterus: Secondary | ICD-10-CM

## 2022-06-30 DIAGNOSIS — Z882 Allergy status to sulfonamides status: Secondary | ICD-10-CM

## 2022-06-30 HISTORY — DX: Other pulmonary embolism without acute cor pulmonale: I26.99

## 2022-06-30 LAB — I-STAT CHEM 8, ED
BUN: 18 mg/dL (ref 8–23)
Calcium, Ion: 1.11 mmol/L — ABNORMAL LOW (ref 1.15–1.40)
Chloride: 105 mmol/L (ref 98–111)
Creatinine, Ser: 0.9 mg/dL (ref 0.44–1.00)
Glucose, Bld: 178 mg/dL — ABNORMAL HIGH (ref 70–99)
HCT: 44 % (ref 36.0–46.0)
Hemoglobin: 15 g/dL (ref 12.0–15.0)
Potassium: 4.2 mmol/L (ref 3.5–5.1)
Sodium: 138 mmol/L (ref 135–145)
TCO2: 21 mmol/L — ABNORMAL LOW (ref 22–32)

## 2022-06-30 LAB — CBC WITH DIFFERENTIAL/PLATELET
Abs Immature Granulocytes: 0.04 10*3/uL (ref 0.00–0.07)
Basophils Absolute: 0.1 10*3/uL (ref 0.0–0.1)
Basophils Relative: 1 %
Eosinophils Absolute: 0.3 10*3/uL (ref 0.0–0.5)
Eosinophils Relative: 2 %
HCT: 47.2 % — ABNORMAL HIGH (ref 36.0–46.0)
Hemoglobin: 15.3 g/dL — ABNORMAL HIGH (ref 12.0–15.0)
Immature Granulocytes: 0 %
Lymphocytes Relative: 20 %
Lymphs Abs: 2.5 10*3/uL (ref 0.7–4.0)
MCH: 29.8 pg (ref 26.0–34.0)
MCHC: 32.4 g/dL (ref 30.0–36.0)
MCV: 92 fL (ref 80.0–100.0)
Monocytes Absolute: 1.3 10*3/uL — ABNORMAL HIGH (ref 0.1–1.0)
Monocytes Relative: 11 %
Neutro Abs: 8.1 10*3/uL — ABNORMAL HIGH (ref 1.7–7.7)
Neutrophils Relative %: 66 %
Platelets: 220 10*3/uL (ref 150–400)
RBC: 5.13 MIL/uL — ABNORMAL HIGH (ref 3.87–5.11)
RDW: 12.2 % (ref 11.5–15.5)
WBC: 12.3 10*3/uL — ABNORMAL HIGH (ref 4.0–10.5)
nRBC: 0 % (ref 0.0–0.2)

## 2022-06-30 LAB — TROPONIN I (HIGH SENSITIVITY)
Troponin I (High Sensitivity): 209 ng/L (ref <18)
Troponin I (High Sensitivity): 220 ng/L (ref <18)
Troponin I (High Sensitivity): 122 ng/L (ref <18)
Troponin I (High Sensitivity): 109 ng/L (ref <18)

## 2022-06-30 LAB — HEPARIN LEVEL (UNFRACTIONATED)
Heparin Unfractionated: 0.62 IU/mL (ref 0.30–0.70)
Heparin Unfractionated: 0.61 IU/mL (ref 0.30–0.70)

## 2022-06-30 LAB — RESP PANEL BY RT-PCR (RSV, FLU A&B, COVID)  RVPGX2
Influenza A by PCR: NEGATIVE
Influenza B by PCR: NEGATIVE
Resp Syncytial Virus by PCR: NEGATIVE
SARS Coronavirus 2 by RT PCR: NEGATIVE

## 2022-06-30 LAB — D-DIMER, QUANTITATIVE: D-Dimer, Quant: 12.88 ug/mL-FEU — ABNORMAL HIGH (ref 0.00–0.50)

## 2022-06-30 LAB — ECHOCARDIOGRAM COMPLETE
Area-P 1/2: 4.57 cm2
Height: 63 in
S' Lateral: 2.7 cm
Weight: 2960 oz

## 2022-06-30 LAB — COMPREHENSIVE METABOLIC PANEL
ALT: 24 U/L (ref 0–44)
AST: 34 U/L (ref 15–41)
Albumin: 4.1 g/dL (ref 3.5–5.0)
Alkaline Phosphatase: 44 U/L (ref 38–126)
Anion gap: 18 — ABNORMAL HIGH (ref 5–15)
BUN: 15 mg/dL (ref 8–23)
CO2: 17 mmol/L — ABNORMAL LOW (ref 22–32)
Calcium: 9.6 mg/dL (ref 8.9–10.3)
Chloride: 103 mmol/L (ref 98–111)
Creatinine, Ser: 1.1 mg/dL — ABNORMAL HIGH (ref 0.44–1.00)
GFR, Estimated: 51 mL/min — ABNORMAL LOW (ref >60)
Glucose, Bld: 155 mg/dL — ABNORMAL HIGH (ref 70–99)
Potassium: 3.9 mmol/L (ref 3.5–5.1)
Sodium: 138 mmol/L (ref 135–145)
Total Bilirubin: 0.5 mg/dL (ref 0.3–1.2)
Total Protein: 7.5 g/dL (ref 6.5–8.1)

## 2022-06-30 LAB — BLOOD GAS, VENOUS
Acid-Base Excess: 0.4 mmol/L (ref 0.0–2.0)
Bicarbonate: 23.1 mmol/L (ref 20.0–28.0)
O2 Saturation: 80.3 %
Patient temperature: 37
pCO2, Ven: 31 mmHg — ABNORMAL LOW (ref 44–60)
pH, Ven: 7.48 — ABNORMAL HIGH (ref 7.25–7.43)
pO2, Ven: 48 mmHg — ABNORMAL HIGH (ref 32–45)

## 2022-06-30 LAB — APTT: aPTT: 32 seconds (ref 24–36)

## 2022-06-30 LAB — BRAIN NATRIURETIC PEPTIDE: B Natriuretic Peptide: 284.4 pg/mL — ABNORMAL HIGH (ref 0.0–100.0)

## 2022-06-30 LAB — PROTIME-INR
INR: 1.1 (ref 0.8–1.2)
Prothrombin Time: 14.3 seconds (ref 11.4–15.2)

## 2022-06-30 MED ORDER — MEMANTINE HCL 10 MG PO TABS
10.0000 mg | ORAL_TABLET | Freq: Two times a day (BID) | ORAL | Status: DC
  Administered 2022-06-30 – 2022-07-05 (×11): 10 mg via ORAL
  Filled 2022-06-30: qty 2
  Filled 2022-06-30 (×7): qty 1
  Filled 2022-06-30 (×2): qty 2
  Filled 2022-06-30: qty 1

## 2022-06-30 MED ORDER — ACETAMINOPHEN 325 MG PO TABS
650.0000 mg | ORAL_TABLET | Freq: Four times a day (QID) | ORAL | Status: DC | PRN
  Administered 2022-07-02 – 2022-07-04 (×3): 650 mg via ORAL
  Filled 2022-06-30 (×4): qty 2

## 2022-06-30 MED ORDER — SODIUM CHLORIDE 0.9% FLUSH
3.0000 mL | Freq: Two times a day (BID) | INTRAVENOUS | Status: DC
  Administered 2022-06-30 – 2022-07-04 (×9): 3 mL via INTRAVENOUS

## 2022-06-30 MED ORDER — IOHEXOL 350 MG/ML SOLN
100.0000 mL | Freq: Once | INTRAVENOUS | Status: AC | PRN
  Administered 2022-06-30: 100 mL via INTRAVENOUS

## 2022-06-30 MED ORDER — PANTOPRAZOLE SODIUM 40 MG PO TBEC
40.0000 mg | DELAYED_RELEASE_TABLET | Freq: Every day | ORAL | Status: DC
  Administered 2022-06-30 – 2022-07-05 (×6): 40 mg via ORAL
  Filled 2022-06-30 (×6): qty 1

## 2022-06-30 MED ORDER — HEPARIN (PORCINE) 25000 UT/250ML-% IV SOLN
1200.0000 [IU]/h | INTRAVENOUS | Status: AC
  Administered 2022-06-30 – 2022-07-01 (×2): 1200 [IU]/h via INTRAVENOUS
  Filled 2022-06-30 (×3): qty 250

## 2022-06-30 MED ORDER — ONDANSETRON HCL 4 MG/2ML IJ SOLN: 4.0000 mg | Freq: Four times a day (QID) | INTRAMUSCULAR | Status: DC | PRN

## 2022-06-30 MED ORDER — HEPARIN (PORCINE) 25000 UT/250ML-% IV SOLN
INTRAVENOUS | Status: AC
  Administered 2022-06-30: 1200 [IU]/h via INTRAVENOUS
  Filled 2022-06-30: qty 250

## 2022-06-30 MED ORDER — HEPARIN BOLUS VIA INFUSION
4000.0000 [IU] | Freq: Once | INTRAVENOUS | Status: AC
  Administered 2022-06-30: 4000 [IU] via INTRAVENOUS
  Filled 2022-06-30: qty 4000

## 2022-06-30 MED ORDER — ONDANSETRON HCL 4 MG PO TABS
4.0000 mg | ORAL_TABLET | Freq: Four times a day (QID) | ORAL | Status: DC | PRN
  Administered 2022-06-30: 4 mg via ORAL
  Filled 2022-06-30: qty 1

## 2022-06-30 MED ORDER — FLUTICASONE PROPIONATE 50 MCG/ACT NA SUSP: 2.0000 | Freq: Every day | NASAL | Status: DC | PRN

## 2022-06-30 MED ORDER — DONEPEZIL HCL 10 MG PO TABS
10.0000 mg | ORAL_TABLET | Freq: Every day | ORAL | Status: DC
  Administered 2022-06-30 – 2022-07-04 (×5): 10 mg via ORAL
  Filled 2022-06-30 (×2): qty 1
  Filled 2022-06-30: qty 2
  Filled 2022-06-30 (×2): qty 1

## 2022-06-30 MED ORDER — LEVOTHYROXINE SODIUM 50 MCG PO TABS: 50.0000 ug | ORAL_TABLET | Freq: Every day | ORAL | Status: DC

## 2022-06-30 MED ORDER — HYDROXYZINE HCL 25 MG PO TABS
25.0000 mg | ORAL_TABLET | Freq: Four times a day (QID) | ORAL | Status: DC | PRN
  Administered 2022-06-30 – 2022-07-04 (×3): 25 mg via ORAL
  Filled 2022-06-30 (×3): qty 1

## 2022-06-30 MED ORDER — SODIUM CHLORIDE (PF) 0.9 % IJ SOLN
INTRAMUSCULAR | Status: AC
  Administered 2022-06-30: 3 mL via INTRAVENOUS
  Filled 2022-06-30: qty 50

## 2022-06-30 MED ORDER — ACETAMINOPHEN 650 MG RE SUPP: 650.0000 mg | Freq: Four times a day (QID) | RECTAL | Status: DC | PRN

## 2022-06-30 MED ORDER — ATORVASTATIN CALCIUM 10 MG PO TABS: 20.0000 mg | ORAL_TABLET | Freq: Every day | ORAL | Status: DC

## 2022-06-30 MED ORDER — TAMOXIFEN CITRATE 10 MG PO TABS
20.0000 mg | ORAL_TABLET | Freq: Every day | ORAL | Status: DC
  Administered 2022-06-30: 20 mg via ORAL
  Filled 2022-06-30 (×2): qty 2

## 2022-06-30 MED ORDER — VENLAFAXINE HCL ER 150 MG PO CP24
150.0000 mg | ORAL_CAPSULE | Freq: Every day | ORAL | Status: DC
  Administered 2022-06-30 – 2022-07-05 (×6): 150 mg via ORAL
  Filled 2022-06-30 (×3): qty 1
  Filled 2022-06-30: qty 2
  Filled 2022-06-30: qty 1
  Filled 2022-06-30: qty 2

## 2022-06-30 NOTE — ED Notes (Addendum)
Date and time results received: 06/30/22 3:33 AM  (use smartphrase ".now" to insert current time)  Test: Troponin  Critical Value: 220  Name of Provider Notified:   Orders Received? Or Actions Taken?: Orders Received - See Orders for details

## 2022-06-30 NOTE — Progress Notes (Signed)
ANTICOAGULATION CONSULT NOTE - Follow Up Consult  Pharmacy Consult for Heparin Indication:  DVT and PE  Allergies  Allergen Reactions   Latex Rash   Sulfa Antibiotics Anaphylaxis and Nausea Only   Sulfamethoxazole-Trimethoprim Anaphylaxis and Nausea Only   Aspirin Other (See Comments)    GI bleeding   Nsaids Other (See Comments)    GI upset    Oxycodone-Acetaminophen Nausea Only    Patient Measurements: Height: '5\' 3"'$  (160 cm) Weight: 83.9 kg (185 lb) IBW/kg (Calculated) : 52.4 Heparin Dosing Weight: 71 kg  Vital Signs: Temp: 98 F (36.7 C) (01/01 0902) Temp Source: Oral (01/01 0902) BP: 162/102 (01/01 1130) Pulse Rate: 98 (01/01 1130)  Labs: Recent Labs    06/30/22 0021 06/30/22 0104 06/30/22 0224 06/30/22 1152  HGB 15.3* 15.0  --   --   HCT 47.2* 44.0  --   --   PLT 220  --   --   --   APTT 32  --   --   --   LABPROT 14.3  --   --   --   INR 1.1  --   --   --   HEPARINUNFRC  --   --   --  0.62  CREATININE 1.10* 0.90  --   --   TROPONINIHS 209*  --  220*  --     Estimated Creatinine Clearance: 51.2 mL/min (by C-G formula based on SCr of 0.9 mg/dL).   Assessment: AC/Heme: New BL PE: Acute submassive bilateral PE with RV strain >>started on IV heparin. - Baseline CBC WNL - Dopplers also with R popliteal DVT.  - Initial Hep level 0.62 in goal range.  Goal of Therapy:  Heparin level 0.3-0.7 units/ml Monitor platelets by anticoagulation protocol: Yes   Plan:  Con't IV heparin 1200 units/hr Recheck heparin level in 6 hrs. Daily HL and CBC   Arseniy Toomey S. Alford Highland, PharmD, BCPS Clinical Staff Pharmacist [email or web address] Alford Highland, Adrienne Trombetta Stillinger 06/30/2022,12:41 PM

## 2022-06-30 NOTE — Progress Notes (Signed)
ANTICOAGULATION CONSULT NOTE - Follow Up Consult  Pharmacy Consult for Heparin Indication:  DVT and PE  Allergies  Allergen Reactions   Latex Rash   Sulfa Antibiotics Anaphylaxis and Nausea Only   Sulfamethoxazole-Trimethoprim Anaphylaxis and Nausea Only   Aspirin Other (See Comments)    GI bleeding   Nsaids Other (See Comments)    GI upset    Oxycodone-Acetaminophen Nausea Only    Patient Measurements: Height: '5\' 3"'$  (160 cm) Weight: 83.9 kg (185 lb) IBW/kg (Calculated) : 52.4 Heparin Dosing Weight: 71 kg  Vital Signs: Temp: 97.4 F (36.3 C) (01/01 1644) Temp Source: Oral (01/01 1644) BP: 125/90 (01/01 1830) Pulse Rate: 113 (01/01 1830)  Labs: Recent Labs    06/30/22 0021 06/30/22 0104 06/30/22 0224 06/30/22 1152 06/30/22 1357 06/30/22 1605  HGB 15.3* 15.0  --   --   --   --   HCT 47.2* 44.0  --   --   --   --   PLT 220  --   --   --   --   --   APTT 32  --   --   --   --   --   LABPROT 14.3  --   --   --   --   --   INR 1.1  --   --   --   --   --   HEPARINUNFRC  --   --   --  0.62  --   --   CREATININE 1.10* 0.90  --   --   --   --   TROPONINIHS 209*  --  220*  --  122* 109*     Estimated Creatinine Clearance: 51.2 mL/min (by C-G formula based on SCr of 0.9 mg/dL).   Assessment: 83 yo female with breast cancer admitted with acute submassive bilateral PE with RV strain. Dopplers also with R popliteal DVT.  Pharmacy consulted to dose IV heparin.   Today, 06/30/2022 Heparin level 0.61, therapeutic on heparin 1200 units/hr CBC: wnl this AM SCr 0.9 No bleeding reported  Goal of Therapy:  Heparin level 0.3-0.7 units/ml Monitor platelets by anticoagulation protocol: Yes   Plan:  Con't IV heparin 1200 units/hr Daily HL and CBC Monitor for signs/symptoms of bleeding  Peggyann Juba, PharmD, BCPS Pharmacy: [phone] 06/30/2022,7:06 PM

## 2022-06-30 NOTE — ED Notes (Addendum)
Patient swallowed pills whole and without difficulty. Patient alert and oriented.  32 G IV in left hand and 18G in left AC both patent and flushed without difficulty or causing pain to patient. Heparin continues to run into left hand IV at time of assessment.

## 2022-06-30 NOTE — ED Triage Notes (Signed)
Pt c/o shortness of breath for about a week that got worse today. Pt reports chest pressure prior to EMS arrival that has resolved.

## 2022-06-30 NOTE — H&P (Signed)
History and Physical    Patient: Brenda Stephens BJS:[id number] DOB: Mar 16, 1941 DOA: 06/30/2022 DOS: the patient was seen and examined on 06/30/2022 PCP: Jola Baptist, PA-C  Patient coming from: Home  Chief Complaint:  Chief Complaint  Patient presents with   Shortness of Breath   HPI: Brenda Stephens is a 82 y.o. female with medical history significant of arthritis, right breast cancer currently on tamoxifen, GERD, dyslipidemia who presented to the ER with shortness of breath x 1 week.  Of breath worse with walking.  Patient presented to the ER because the shortness of breath had worsened to the point she could not make it from the bathroom to her chair.  She has been having ongoing ankle swelling.  She has not had any trips via vehicle or airplane travel.  She has been having some issues with nausea and diffuse abdominal pain and because of this she has been more sedentary and has had poor oral intake.  Presentation she was afebrile tachycardic and somewhat tachypneic, hypertensive with room air saturations 95%.  ET angiography of the chest revealed acute PE bilateral near occlusive clots in both distal main pulmonary arteries with extension into the bilateral lobar main arteries, segmental arteries and a few downstream subsegmental arterial branches with overall large clot burden.  This was associated with RV strain.  There was also an incidental hepatic steatosis.  Request was made for admission to the hospitalist service.  Patient denies any personal or known history of coagulopathy disorders.  She once again denies any history of recent car travel, plane travel but again states she has been more sedentary due to some nonspecific GI symptoms as well as poor oral intake.  She also has a history of breast cancer and was due for follow-up this week.   Review of Systems: As mentioned in the history of present illness. All other systems reviewed and are negative. Past Medical History:   Diagnosis Date   Allergy    latex   Anemia    Anxiety    Arthritis    Cancer ([place]) 01/2017   right breast cancer   Depression    Dyslipidemia    Eustachian tube dysfunction    GERD (gastroesophageal reflux disease)    Hx of cardiovascular stress test    Myoview 10/13:  apical thinning, EF 72%, no ischemia   Hx of colonic polyps    Dr Watt Climes   Hypertension    Hypothyroidism    Other abnormal glucose    Past Surgical History:  Procedure Laterality Date   ABDOMINAL HYSTERECTOMY     BSO for uterine fibroids   ACHILLES TENDON SURGERY Right 10/07/2012   Procedure: RIGHT REPAIR RUPTURE TENDON PRIMARY OPEN/PERCUTANEOUS ;  Surgeon: Ninetta Lights, MD;  Location: [place];  Service: Orthopedics;  Laterality: Right;   APPENDECTOMY     BREAST LUMPECTOMY WITH RADIOACTIVE SEED AND SENTINEL LYMPH NODE BIOPSY Right 03/16/2017   Procedure: RIGHT BREAST LUMPECTOMY WITH RADIOACTIVE SEED AND RIGHT AXILLARY SENTINEL  NODE BIOPSY ERAS PATHWAY;  Surgeon: [name], MD;  Location: [place];  Service: General;  Laterality: Right;  PECTORAL BLOCK   CATARACT EXTRACTION, BILATERAL     CHOLECYSTECTOMY     COLONOSCOPY W/ POLYPECTOMY     Dr Watt Climes   EAR CYST EXCISION Right 10/07/2012   Procedure: EXCISION BONE CYST BENIGN TUMOR [name] ;  Surgeon: Ninetta Lights, MD;  Location: [place];  Service: Orthopedics;  Laterality:  Right;   EXPLORATORY LAPAROTOMY     LEFT HEART CATH AND CORONARY ANGIOGRAPHY N/A 08/02/2019   Procedure: LEFT HEART CATH AND CORONARY ANGIOGRAPHY;  Surgeon: Martinique, Peter M, MD;  Location: [place] [facility];  Service: Cardiovascular;  Laterality: N/A;   REVISION TOTAL SHOULDER TO REVERSE TOTAL SHOULDER Left 01/12/2019   Procedure: REVISION TOTAL SHOULDER TO REVERSE TOTAL SHOULDER;  Surgeon: Hiram Gash, MD;  Location: [facility];  Service: Orthopedics;  Laterality: Left;   SINUS SURGERY WITH INSTATRAK     TOTAL SHOULDER  REPLACEMENT     Right shoulder   TOTAL SHOULDER REPLACEMENT  05/2010   Left   TUBAL LIGATION     Social History:  reports that she quit smoking about 60 years ago. Her smoking use included cigarettes. She has a 3.00 pack-year smoking history. She has never used smokeless tobacco. She reports current alcohol use of about 3.0 standard drinks of alcohol per week. She reports that she does not use drugs.  Allergies  Allergen Reactions   Latex Rash   Sulfa Antibiotics Anaphylaxis and Nausea Only   Sulfamethoxazole-Trimethoprim Anaphylaxis and Nausea Only   Aspirin Other (See Comments)    GI bleeding   Nsaids Other (See Comments)    GI upset    Oxycodone-Acetaminophen Nausea Only    Family History  Problem Relation Age of Onset   Hiatal hernia Mother    Heart attack Mother 69   Heart disease Father 11       CAD @ autopsy   Cancer Father        renal cancer   Heart attack Brother 13       fatal   Diabetes Neg Hx    Stroke Neg Hx     Prior to Admission medications   Medication Sig Start Date End Date Taking? Authorizing Provider  acetaminophen (TYLENOL) 500 MG tablet Take 1,000 mg by mouth 2 (two) times daily as needed for moderate pain.    [provider]  atorvastatin (LIPITOR) 20 MG tablet TAKE 1 TABLET BY MOUTH AT BEDTIME Patient taking differently: Take 20 mg by mouth at bedtime. 05/19/13   Hendricks Limes, MD  dicyclomine (BENTYL) 20 MG tablet Take by mouth. 02/20/22   [provider]  donepezil (ARICEPT) 10 MG tablet Take 10 mg by mouth at bedtime. 12/27/21   [provider]  doxycycline (VIBRA-TABS) 100 MG tablet Take 1 tablet (100 mg total) by mouth 2 (two) times daily. 10/24/21   Baird Lyons D, MD  esomeprazole (NEXIUM) 20 MG capsule Take 20 mg by mouth at bedtime.    [provider]  fluticasone (FLONASE) 50 MCG/ACT nasal spray Place 2 sprays into both nostrils daily as needed for allergies or rhinitis.  08/29/19   [provider]  halobetasol (ULTRAVATE) 0.05 % cream Apply topically daily. 04/24/20   Princess Bruins, MD  ketoconazole (NIZORAL) 2 % cream Apply 1 application topically daily. 06/11/21   [name], [name], MD  meclizine (ANTIVERT) 25 MG tablet Take 1 tablet (25 mg total) by mouth 3 (three) times daily as needed for dizziness. 09/20/21   Lupita Dawn, MD  memantine (NAMENDA) 10 MG tablet Take 10 mg by mouth 2 (two) times daily. 12/27/21   [provider]  nitroGLYCERIN (NITROSTAT) 0.4 MG SL tablet Place 1 tablet (0.4 mg total) under the tongue every 5 (five) minutes as needed for chest pain. 07/26/19 10/24/19  [name], MD  nystatin (MYCOSTATIN/NYSTOP) powder Apply 1 application topically  2 (two) times daily. 04/24/20   Princess Bruins, MD  sennosides-docusate sodium (SENOKOT-S) 8.6-50 MG tablet Take 2 tablets by mouth daily as needed for constipation.    [provider]  SYNTHROID 50 MCG tablet PT NEEDS COMPLETE PHYSICAL.  TAKE 1 TABLET (50MCG TOTAL) BY MOUTH DAILY. Patient taking differently: Take 50 mcg by mouth daily. 04/07/14   Hendricks Limes, MD  tamoxifen (NOLVADEX) 20 MG tablet Take 1 tablet (20 mg total) by mouth daily. 06/11/21   [name], [name], MD  venlafaxine XR (EFFEXOR-XR) 150 MG 24 hr capsule TAKE 1 CAPSULE (150 MG TOTAL) BY MOUTH DAILY. Patient taking differently: Take 150 mg by mouth daily. 12/14/12   Hendricks Limes, MD    Physical Exam: Vitals:   06/30/22 0900 06/30/22 0902 06/30/22 0930 06/30/22 1000  BP: (!) 126/91  (!) 142/84 (!) 161/82  Pulse: 100  97 99  Resp: (!) 25  (!) 25 20  Temp:  98 F (36.7 C)    TempSrc:  Oral    SpO2: 94%  95% 95%  Weight:      Height:       Constitutional: NAD, calm, comfortable Eyes: PERRL, lids and conjunctivae normal ENMT: Mucous membranes are slightly dry. Posterior pharynx clear of any exudate or lesions.Normal dentition.  Neck: normal, supple, no masses, no thyromegaly Respiratory: clear to  auscultation bilaterally, no wheezing, no crackles. Normal respiratory effort. No accessory muscle use.  Cardiovascular: Regular rate and rhythm, no murmurs / rubs / gallops.  Trace, nonpitting bilateral lower extremity edema. 2+ pedal pulses.   Abdomen: Diffusely tender without guarding or rebounding, no masses palpated. No obvious hepatosplenomegaly. Bowel sounds positive.  Musculoskeletal: no clubbing / cyanosis. No joint deformity upper and lower extremities. Good ROM, no contractures. Normal muscle tone.  Skin: no rashes, lesions, ulcers. No induration Neurologic: CN 2-12 grossly intact. Sensation intact, Strength 4/5 x all 4 extremities.  Psychiatric:Alert and oriented x name, place and time of day-knew it was breakfast time. Normal mood.   Data Reviewed:  Initial labs as follows: Sodium 138, potassium 3.9, chloride 103, CO2 17, glucose 155, BUN 15, creatinine 1.10, calcium 9.6, anion gap 18, LFTs are normal, BNP 284.4, troponin 209, white count 12,300, hemoglobin 15.3, platelets 220,000, D-dimer 12.88, coags normal, influenza, RSV and COVID PCR negative, CT angio of the chest as above.  Assessment and Plan: Acute submassive bilateral PE with RV strain/intermittent hypoxemia Etiology unclear-patient does have a history of breast cancer without apparent recurrence Bilateral lower extremity duplex Given right heart strain will check echocardiogram O2 sats around 93% on room air at rest.  Apply continuous oxygen for now given acute submassive PE.  Will check ambulatory room air sats as well  Elevated troponin and BNP Secondary to PE with right heart strain Troponin trend flat in the 200s and will continue to follow Evaluated September 2023 outpatient cardiology for chest pain-had a left heart catheterization in 2021 with normal coronaries  Abdominal discomfort with nausea Nonobstructive bowel pattern with gas and stool scattered throughout the colon extending to the level of the distal  rectum  History of right breast cancer Continue tamoxifen  Dyslipidemia No longer on statin (reported this to pharmacist completing med rec)  Dementia Continue home Aricept and Namenda  GERD Continue PPI  Hyperglycemia Hemoglobin A1c    Advance Care Planning:   Code Status: Full Code   DVT prophylaxis: IV heparin infusion  Consults: None  Family Communication: None  Severity of Illness: The appropriate  patient status for this patient is INPATIENT. Inpatient status is judged to be reasonable and necessary in order to provide the required intensity of service to ensure the patient's safety. The patient's presenting symptoms, physical exam findings, and initial radiographic and laboratory data in the context of their chronic comorbidities is felt to place them at high risk for further clinical deterioration. Furthermore, it is not anticipated that the patient will be medically stable for discharge from the hospital within 2 midnights of admission.   * I certify that at the point of admission it is my clinical judgment that the patient will require inpatient hospital care spanning beyond 2 midnights from the point of admission due to high intensity of service, high risk for further deterioration and high frequency of surveillance required.*  Author: Erin Hearing, NP 06/30/2022 11:38 AM  For on call review [email or web address].

## 2022-06-30 NOTE — Progress Notes (Signed)
ANTICOAGULATION CONSULT NOTE - Initial Consult  Pharmacy Consult for heparin Indication: pulmonary embolus  Allergies  Allergen Reactions   Latex Rash   Aspirin Other (See Comments)    GI bleeding   Oxycodone-Acetaminophen Nausea Only   Sulfa Antibiotics Nausea Only   Sulfamethoxazole-Trimethoprim Nausea Only    Patient Measurements: Height: '5\' 3"'$  (160 cm) Weight: 83.9 kg (185 lb) IBW/kg (Calculated) : 52.4 Heparin Dosing Weight: 71kg  Vital Signs: Temp: 97.3 F (36.3 C) (01/01 0016) Temp Source: Oral (01/01 0016) BP: 158/93 (01/01 0230) Pulse Rate: 98 (01/01 0230)  Labs: Recent Labs    06/30/22 0021 06/30/22 0104  HGB 15.3* 15.0  HCT 47.2* 44.0  PLT 220  --   CREATININE  --  0.90    Estimated Creatinine Clearance: 51.2 mL/min (by C-G formula based on SCr of 0.9 mg/dL).   Medical History: Past Medical History:  Diagnosis Date   Allergy    latex   Anemia    Anxiety    Arthritis    Cancer ([place]) 01/2017   right breast cancer   Depression    Dyslipidemia    Eustachian tube dysfunction    GERD (gastroesophageal reflux disease)    Hx of cardiovascular stress test    Myoview 10/13:  apical thinning, EF 72%, no ischemia   Hx of colonic polyps    Dr Watt Climes   Hypertension    Hypothyroidism    Other abnormal glucose      Assessment: 82 yo female presents with gradually worsening SOB and intermittent substernal chest pressure that is nonradiating. CTA was ordered and notable for bilateral PEs with right heart strain. Pharmacy to dose heparin drip, no prior AC noted.  CBC WNL, Scr 0.9, DDimer 12.8   Goal of Therapy:  Heparin level 0.3-0.7 units/ml Monitor platelets by anticoagulation protocol: Yes   Plan:  Baseline labs STAT Heparin bolus 4000 units x 1 Start heparin drip at 1200 units/hr Heparin level in 8 hours Daily CBC  Dolly Rias RPh 06/30/2022, 3:28 AM

## 2022-06-30 NOTE — Progress Notes (Signed)
BLE venous duplex has been completed.  Preliminary findings given to Gwyndolyn Saxon, RN.   Results can be found under chart review under CV PROC. 06/30/2022 12:21 PM Laria Grimmett RVT, RDMS

## 2022-06-30 NOTE — ED Provider Notes (Signed)
[facility] DEPT Provider Note  CSN: [id number] Arrival date & time: 06/30/22 0007  Chief Complaint(s) Shortness of Breath  HPI Brenda Stephens is a 82 y.o. female with a past medical history listed below who presents to the emergency department with 1 week of gradually worsening shortness of breath that became more severe tonight after walking from the bathroom back to her chair.  She is also endorsing intermittent substernal chest pressure that is nonradiating.  No fevers or chills.  No coughing or congestion.  She endorses bilateral ankle swelling that is been going on for a while.  No recent travel but she is sedentary most of the day.  The history is provided by the patient.    Past Medical History Past Medical History:  Diagnosis Date   Allergy    latex   Anemia    Anxiety    Arthritis    Cancer ([place]) 01/2017   right breast cancer   Depression    Dyslipidemia    Eustachian tube dysfunction    GERD (gastroesophageal reflux disease)    Hx of cardiovascular stress test    Myoview 10/13:  apical thinning, EF 72%, no ischemia   Hx of colonic polyps    Dr Watt Climes   Hypertension    Hypothyroidism    Other abnormal glucose    Patient Active Problem List   Diagnosis Date Noted   Pulmonary embolism ([facility]) 06/30/2022   Right lower lobe pulmonary nodule 10/24/2021   Palpitations 01/03/2021   DOE (dyspnea on exertion) 10/12/2019   Unstable angina ([place]) 08/02/2019   Rotator cuff arthropathy, left 01/12/2019   Vertigo 02/04/2018   Leg swelling 12/17/2017   Acute bronchitis 08/04/2017   Malignant neoplasm of upper-outer quadrant of right breast in female, estrogen receptor positive ([place]) 03/13/2017   Acute frontal sinusitis 05/13/2013   Chest pain of uncertain etiology 62/95/2841   Fasting hyperglycemia 06/13/2011   HYPOTHYROIDISM 05/06/2010   Hyperlipidemia 05/06/2010   ARTHRITIS, SHOULDER 05/06/2010   FATIGUE, CHRONIC 05/06/2010   Seasonal and  perennial allergic rhinitis 03/14/2010   ANEMIA-IRON DEFICIENCY 10/29/2009   Essential hypertension 10/29/2009   GERD 10/29/2009   COLONIC POLYPS, HX OF 32/44/0102   UMBILICAL HERNIA, HX OF 72/53/6644   Home Medication(s) Prior to Admission medications   Medication Sig Start Date End Date Taking? Authorizing Provider  acetaminophen (TYLENOL) 500 MG tablet Take 1,000 mg by mouth 2 (two) times daily as needed for moderate pain.    [provider]  atorvastatin (LIPITOR) 20 MG tablet TAKE 1 TABLET BY MOUTH AT BEDTIME Patient taking differently: Take 20 mg by mouth at bedtime. 05/19/13   Hendricks Limes, MD  dicyclomine (BENTYL) 20 MG tablet Take by mouth. 02/20/22   [provider]  donepezil (ARICEPT) 10 MG tablet Take 10 mg by mouth at bedtime. 12/27/21   [provider]  doxycycline (VIBRA-TABS) 100 MG tablet Take 1 tablet (100 mg total) by mouth 2 (two) times daily. 10/24/21   Baird Lyons D, MD  esomeprazole (NEXIUM) 20 MG capsule Take 20 mg by mouth at bedtime.    [provider]  fluticasone (FLONASE) 50 MCG/ACT nasal spray Place 2 sprays into both nostrils daily as needed for allergies or rhinitis.  08/29/19   [provider]  halobetasol (ULTRAVATE) 0.05 % cream Apply topically daily. 04/24/20   Princess Bruins, MD  ketoconazole (NIZORAL) 2 % cream Apply 1 application topically daily. 06/11/21   [name], [name], MD  meclizine Johnathan Hausen)  25 MG tablet Take 1 tablet (25 mg total) by mouth 3 (three) times daily as needed for dizziness. 09/20/21   Lupita Dawn, MD  memantine (NAMENDA) 10 MG tablet Take 10 mg by mouth 2 (two) times daily. 12/27/21   [provider]  nitroGLYCERIN (NITROSTAT) 0.4 MG SL tablet Place 1 tablet (0.4 mg total) under the tongue every 5 (five) minutes as needed for chest pain. 07/26/19 10/24/19  [name], MD  nystatin (MYCOSTATIN/NYSTOP) powder Apply 1 application topically 2 (two) times daily. 04/24/20    Princess Bruins, MD  sennosides-docusate sodium (SENOKOT-S) 8.6-50 MG tablet Take 2 tablets by mouth daily as needed for constipation.    [provider]  SYNTHROID 50 MCG tablet PT NEEDS COMPLETE PHYSICAL.  TAKE 1 TABLET (50MCG TOTAL) BY MOUTH DAILY. Patient taking differently: Take 50 mcg by mouth daily. 04/07/14   Hendricks Limes, MD  tamoxifen (NOLVADEX) 20 MG tablet Take 1 tablet (20 mg total) by mouth daily. 06/11/21   [name], [name], MD  venlafaxine XR (EFFEXOR-XR) 150 MG 24 hr capsule TAKE 1 CAPSULE (150 MG TOTAL) BY MOUTH DAILY. Patient taking differently: Take 150 mg by mouth daily. 12/14/12   Hendricks Limes, MD                                                                                                                                    Allergies Latex, Aspirin, Oxycodone-acetaminophen, Sulfa antibiotics, and Sulfamethoxazole-trimethoprim  Review of Systems Review of Systems As noted in HPI  Physical Exam Vital Signs  I have reviewed the triage vital signs BP (!) 148/85   Pulse 97   Temp (!) 97.3 F (36.3 C) (Oral) Comment: Simultaneous filing. User may not have seen previous data. Comment (Src): Simultaneous filing. User may not have seen previous data.  Resp (!) 27   Ht '5\' 3"'$  (1.6 m)   Wt 83.9 kg   SpO2 97%   BMI 32.77 kg/m   Physical Exam Vitals reviewed.  Constitutional:      General: She is not in acute distress.    Appearance: She is well-developed. She is not diaphoretic.  HENT:     Head: Normocephalic and atraumatic.     Nose: Nose normal.  Eyes:     General: No scleral icterus.       Right eye: No discharge.        Left eye: No discharge.     Conjunctiva/sclera: Conjunctivae normal.     Pupils: Pupils are equal, round, and reactive to light.  Cardiovascular:     Rate and Rhythm: Regular rhythm. Tachycardia present.     Heart sounds: No murmur heard.    No friction rub. No gallop.  Pulmonary:     Effort: Pulmonary effort is  normal. No respiratory distress.     Breath sounds: Normal breath sounds. No stridor. No rales.  Abdominal:     General: There is  no distension.     Palpations: Abdomen is soft.     Tenderness: There is no abdominal tenderness.  Musculoskeletal:        General: No tenderness.     Cervical back: Normal range of motion and neck supple.  Skin:    General: Skin is warm and dry.     Findings: No erythema or rash.  Neurological:     Mental Status: She is alert and oriented to person, place, and time.     ED Results and Treatments Labs (all labs ordered are listed, but only abnormal results are displayed) Labs Reviewed  BRAIN NATRIURETIC PEPTIDE - Abnormal; Notable for the following components:      Result Value   B Natriuretic Peptide 284.4 (*)    All other components within normal limits  BLOOD GAS, VENOUS - Abnormal; Notable for the following components:   pH, Ven 7.48 (*)    pCO2, Ven 31 (*)    pO2, Ven 48 (*)    All other components within normal limits  CBC WITH DIFFERENTIAL/PLATELET - Abnormal; Notable for the following components:   WBC 12.3 (*)    RBC 5.13 (*)    Hemoglobin 15.3 (*)    HCT 47.2 (*)    Neutro Abs 8.1 (*)    Monocytes Absolute 1.3 (*)    All other components within normal limits  D-DIMER, QUANTITATIVE - Abnormal; Notable for the following components:   D-Dimer, Quant 12.88 (*)    All other components within normal limits  I-STAT CHEM 8, ED - Abnormal; Notable for the following components:   Glucose, Bld 178 (*)    Calcium, Ion 1.11 (*)    TCO2 21 (*)    All other components within normal limits  TROPONIN I (HIGH SENSITIVITY) - Abnormal; Notable for the following components:   Troponin I (High Sensitivity) 220 (*)    All other components within normal limits  RESP PANEL BY RT-PCR (RSV, FLU A&B, COVID)  [id number]  COMPREHENSIVE METABOLIC PANEL  APTT  PROTIME-INR  TROPONIN I (HIGH SENSITIVITY)                                                                                                                          EKG  EKG Interpretation  Date/Time:  Monday June 30 2022 00:16:28 EST Ventricular Rate:  112 PR Interval:  148 QRS Duration: 75 QT Interval:  309 QTC Calculation: 422 R Axis:   -14 Text Interpretation: Sinus tachycardia with irregular rate Low voltage, precordial leads Nonspecific T abnrm, anterolateral leads No significant change was found Confirmed by Addison Lank [phone]) on 06/30/2022 1:27:16 AM       Radiology CT Angio Chest PE W and/or Wo Contrast  Result Date: 06/30/2022 CLINICAL DATA:  Shortness of breath for the past week, worsening today. EXAM: CT ANGIOGRAPHY CHEST WITH CONTRAST TECHNIQUE: Multidetector CT imaging of the chest was performed using the standard protocol during bolus administration of intravenous contrast. Multiplanar CT image  reconstructions and MIPs were obtained to evaluate the vascular anatomy. RADIATION DOSE REDUCTION: This exam was performed according to the departmental dose-optimization program which includes automated exposure control, adjustment of the mA and/or kV according to patient size and/or use of iterative reconstruction technique. CONTRAST:  187m OMNIPAQUE IOHEXOL 350 MG/ML SOLN COMPARISON:  Portable chest today, chest radiograph 09/20/2021, CTA chest 02/25/2022. FINDINGS: Cardiovascular: There is mild cardiomegaly with a right chamber predominance and RV/LV ratio of 1.31 indicating right heart strain. The pulmonary trunk is 3 cm and both main pulmonary arteries approaching 3 cm indicating arterial hypertension. There are bilateral near-occlusive clots, almost certainly acute, in both distal main pulmonary arteries with extension into the bilateral lobar main arteries, segmental arteries and a few downstream subsegmental arterial branches with overall large clot burden. There is no pericardial effusion. There is scattered calcification in the left main and LAD coronary artery. There is aortic  atherosclerosis and tortuosity without aneurysm or dissection. The great vessels are not well seen due to metal spray artifact from bilateral shoulder replacements but they are grossly patent at least proximally. Pulmonary veins are decompressed. Mediastinum/Nodes: No enlarged mediastinal, hilar, or axillary lymph nodes. Thyroid gland, trachea, and esophagus demonstrate no significant findings. Surgical clips are again noted in the right axilla Lungs/Pleura: Difficult to evaluate due to abundant respiratory motion artifacts. There are trace pleural effusions. There is a calcified right lower lobe granuloma laterally. Scattered linear scar-like opacities both bases. No confluent dense infiltrate is seen. There is no appreciable noncalcified nodule. No pleural thickening or pneumothorax. Upper Abdomen: Hepatic steatosis. No acute abnormality accounting for respiratory motion. Moderate pancreatic atrophy. Musculoskeletal: There is osteopenia, thoracic kyphosis and advanced degenerative change of the thoracic spine. 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 The ribcage is intact. Review of the MIP images confirms the above findings. IMPRESSION: 1. Positive for acute PE with CT evidence of right heart strain (RV/LV Ratio = 1.31 ) consistent with at least submassive (intermediate risk) PE. The presence of right heart strain has been associated with an increased risk of morbidity and mortality. Please refer to the "PE Focused" order set in EPIC. 2. Aortic and coronary artery atherosclerosis. 3. Trace pleural effusions. 4. Hepatic steatosis. 5. Results phoned to Dr. CLeonette Monarchin the ED at 3:03 a.m., 06/30/2022, with verbal acknowledgement of the key findings. Aortic Atherosclerosis (ICD10-I70.0). Electronically Signed   By: KTelford NabM.D.   On: 06/30/2022 03:15   DG Chest Port 1 View  Result Date: 06/30/2022 CLINICAL DATA:  Shortness of breath x1 week. EXAM: PORTABLE CHEST 1  VIEW COMPARISON:  September 20, 2021 FINDINGS: The heart size and mediastinal contours are within normal limits. Low lung volumes are noted. Both lungs are clear. Radiopaque surgical clips are seen along the lateral aspect of the right chest wall. Bilateral shoulder replacements are seen. Multilevel degenerative changes are noted throughout the thoracic spine. IMPRESSION: Low lung volumes without active cardiopulmonary disease. Electronically Signed   By: TVirgina NorfolkM.D.   On: 06/30/2022 00:55    Medications Ordered in ED Medications  sodium chloride (PF) 0.9 % injection (has no administration in time range)  heparin bolus via infusion 4,000 Units (has no administration in time range)  heparin ADULT infusion 100 units/mL (25000 units/2565m (has no administration in time range)  iohexol (OMNIPAQUE) 350 MG/ML injection 100 mL (100 mLs Intravenous Contrast Given 06/30/22 0235)  Procedures .1-3 Lead EKG Interpretation  Performed by: Fatima Blank, MD Authorized by: Fatima Blank, MD     Interpretation: normal     ECG rate:  103   ECG rate assessment: tachycardic     Rhythm: sinus tachycardia     Ectopy: none     Conduction: normal   .Critical Care  Performed by: Fatima Blank, MD Authorized by: Fatima Blank, MD   Critical care provider statement:    Critical care time (minutes):  45   Critical care time was exclusive of:  Separately billable procedures and treating other patients   Critical care was necessary to treat or prevent imminent or life-threatening deterioration of the following conditions:  Circulatory failure   Critical care was time spent personally by me on the following activities:  Development of treatment plan with patient or surrogate, discussions with consultants, evaluation of patient's response to  treatment, examination of patient, obtaining history from patient or surrogate, review of old charts, re-evaluation of patient's condition, pulse oximetry, ordering and review of radiographic studies, ordering and review of laboratory studies and ordering and performing treatments and interventions   Care discussed with: admitting provider     (including critical care time)  Medical Decision Making / ED Course   Medical Decision Making Amount and/or Complexity of Data Reviewed External Data Reviewed: notes.    Details: Heart Cath from 2021 was clean w/o any significant CAD and EF 50-55% Labs: ordered. Decision-making details documented in ED Course. Radiology: ordered and independent interpretation performed. Decision-making details documented in ED Course. ECG/medicine tests: ordered and independent interpretation performed. Decision-making details documented in ED Course.  Risk Prescription drug management. Drug therapy requiring intensive monitoring for toxicity. Decision regarding hospitalization.    Shortness of breath  Differential includes but not limited to pneumonia, pneumothorax, viral infection, heart failure, pleural effusions, pericardial effusion, PE, ACS.  EKG without acute ischemic changes, sinus arrhythmia.  No other dysrhythmias or blocks.  No evidence of pericarditis. Troponins 220  BNP pending  CBC with leukocytosis.  No anemia.  Appears to be hemoconcentrated. Metabolic panel without significant electrolyte derangements or renal insufficiency. Respiratory panel negative for COVID, influenza and RSV Dimer elevated greater than 12, up from 1.2 four months ago.  Chest x-ray without evidence of pneumonia, pneumothorax, pleural effusions, pulmonary edema. Given the elevated dimer, CTA was ordered and notable for bilateral PEs with right heart strain.  This was confirmed by radiology.  No other acute findings noted.  Patient started on IV heparin. She will be  admitted to medicine for further workup and management.        Final Clinical Impression(s) / ED Diagnoses Final diagnoses:  Bilateral pulmonary embolism ([place])           This chart was dictated using voice recognition software.  Despite best efforts to proofread,  errors can occur which can change the documentation meaning.    Fatima Blank, MD 06/30/22 [phone]

## 2022-07-01 ENCOUNTER — Inpatient Hospital Stay (HOSPITAL_COMMUNITY): Payer: MEDICARE

## 2022-07-01 DIAGNOSIS — J9621 Acute and chronic respiratory failure with hypoxia: Secondary | ICD-10-CM | POA: Diagnosis not present

## 2022-07-01 LAB — CBC
HCT: 41.2 % (ref 36.0–46.0)
Hemoglobin: 13.6 g/dL (ref 12.0–15.0)
MCH: 30.4 pg (ref 26.0–34.0)
MCHC: 33 g/dL (ref 30.0–36.0)
MCV: 92.2 fL (ref 80.0–100.0)
Platelets: 211 10*3/uL (ref 150–400)
RBC: 4.47 MIL/uL (ref 3.87–5.11)
RDW: 12.5 % (ref 11.5–15.5)
WBC: 11.3 10*3/uL — ABNORMAL HIGH (ref 4.0–10.5)
nRBC: 0 % (ref 0.0–0.2)

## 2022-07-01 LAB — COMPREHENSIVE METABOLIC PANEL
ALT: 47 U/L — ABNORMAL HIGH (ref 0–44)
AST: 51 U/L — ABNORMAL HIGH (ref 15–41)
Albumin: 3.6 g/dL (ref 3.5–5.0)
Alkaline Phosphatase: 40 U/L (ref 38–126)
Anion gap: 10 (ref 5–15)
BUN: 22 mg/dL (ref 8–23)
CO2: 20 mmol/L — ABNORMAL LOW (ref 22–32)
Calcium: 8.7 mg/dL — ABNORMAL LOW (ref 8.9–10.3)
Chloride: 104 mmol/L (ref 98–111)
Creatinine, Ser: 1.3 mg/dL — ABNORMAL HIGH (ref 0.44–1.00)
GFR, Estimated: 42 mL/min — ABNORMAL LOW (ref >60)
Glucose, Bld: 139 mg/dL — ABNORMAL HIGH (ref 70–99)
Potassium: 4 mmol/L (ref 3.5–5.1)
Sodium: 134 mmol/L — ABNORMAL LOW (ref 135–145)
Total Bilirubin: 1 mg/dL (ref 0.3–1.2)
Total Protein: 7.2 g/dL (ref 6.5–8.1)

## 2022-07-01 LAB — MRSA NEXT GEN BY PCR, NASAL: MRSA by PCR Next Gen: NOT DETECTED

## 2022-07-01 LAB — HEPARIN LEVEL (UNFRACTIONATED): Heparin Unfractionated: 0.7 IU/mL (ref 0.30–0.70)

## 2022-07-01 NOTE — ED Notes (Signed)
RN spoke to [place] in pharmacy and was informed that next heparin level will be drawn as a daily lab and will be drawn tomorrow morning.

## 2022-07-01 NOTE — ED Notes (Signed)
Bladder scan result of 82m.

## 2022-07-01 NOTE — Progress Notes (Addendum)
PROGRESS NOTE    Brenda Stephens  HDQ:[id number] DOB: Oct 30, 1940 DOA: 06/30/2022 PCP: Jola Baptist, PA-C    Brief Narrative: 82 year old female lives at home alone, has a daughter nearby with history of right breast cancer on tamoxifen, hyperlipidemia and GERD admitted with gradually worsening shortness of breath.  She was found to have bilateral acute PE near occlusive clots in both distal main pulmonary arteries with extension into the bilateral lobar main arteries and segmental arteries and a few downstream subsegmental arterial branches with overall large clot burden.  Associated with RV strain.  Venous Doppler shows right lower extremity DVT.  She denies recent travel or surgery.   Echocardiogram-ejection fraction 60 to 65%.   Grade 1 diastolic dysfunction. Her husband passed away not too long ago. She is now 95% on 2 L.  Assessment & Plan:   Principal Problem:   Pulmonary embolism (HCC) Active Problems:   Bilateral pulmonary embolism (HCC)   #1 acute submassive bilateral pulmonary embolism with right lower extremity DVT-predisposing factors are history of breast cancer and has been more sedentary than usual. Continue heparin drip for today.  Consider Eliquis starting tomorrow. Her saturation was 93% on room air at rest.  She was placed on 2 L of oxygen overnight.  She is still boarded in the ER.  #2 elevated troponin peaked at 209 and trending down.  Likely due to pulmonary embolism.  No acute EKG changes.  Patient had a cardiac cath in 2021 with normal coronaries.  #3 history of breast cancer on tamoxifen outpatient follow-up with oncologist Will hold tamoxifen for now with acute blood clots She was suppose to have finished her 5 yrs of tamoxifen in oct and followed up with onc in oct I discussed with Dr. Benay Spice he agreed to continue anticoagulation and to hold tamoxifen and that they will arrange for follow-up with Dr. Chryl Heck or Dr. Payton Mccallum.  #4 dementia continue Aricept and  Namenda  #5 history of GERD on PPI  #6 nausea resolved  #7 hyperlipidemia not on any statins  #8 anxiety/depression on Effexor  #9 elevated creatinine creatinine up to 1.3 from 0.9 on admission.  Unclear reason.  Check bladder scan and renal ultrasound. Avoid nephrotoxins hypotension etc. Estimated body mass index is 32.77 kg/m as calculated from the following:   Height as of this encounter: '5\' 3"'$  (1.6 m).   Weight as of this encounter: 83.9 kg.  DVT prophylaxis: Heparin Code Status: Full code  family Communication: Patient did not want me to call any family members she said she will report it to her daughter disposition Plan:  Status is: Inpatient Remains inpatient appropriate because: Acute PE on IV heparin   Consultants:  None  Procedures: None Antimicrobials: None  Subjective: She feels better than yesterday denies any chest pain breathing is better  Objective: Vitals:   07/01/22 0100 07/01/22 0430 07/01/22 0704 07/01/22 0800  BP:  135/85  (!) 152/82  Pulse:  95  99  Resp:  (!) 24  19  Temp: 98 F (36.7 C)  (!) 97.4 F (36.3 C)   TempSrc: Oral  Oral   SpO2:  95%  95%  Weight:      Height:       No intake or output data in the 24 hours ending 07/01/22 0902 Filed Weights   06/30/22 0013  Weight: 83.9 kg    Examination:  General exam: Appears  in nad Respiratory system: Scattered wheezing to auscultation. Respiratory effort normal. Cardiovascular system: S1 &  S2 heard, RRR. No JVD, murmurs, rubs, gallops or clicks. No pedal edema. Gastrointestinal system: Abdomen is nondistended, soft and nontender. No organomegaly or masses felt. Normal bowel sounds heard. Central nervous system: Alert and oriented. No focal neurological deficits. Extremities: 1+ edema. Skin: No rashes, lesions or ulcers Psychiatry: Judgement and insight appear normal. Mood & affect appropriate.     Data Reviewed: I have personally reviewed following labs and imaging  studies  CBC: Recent Labs  Lab 06/30/22 0021 06/30/22 0104 07/01/22 0451  WBC 12.3*  --  11.3*  NEUTROABS 8.1*  --   --   HGB 15.3* 15.0 13.6  HCT 47.2* 44.0 41.2  MCV 92.0  --  92.2  PLT 220  --  921   Basic Metabolic Panel: Recent Labs  Lab 06/30/22 0021 06/30/22 0104 07/01/22 0451  NA 138 138 134*  K 3.9 4.2 4.0  CL 103 105 104  CO2 17*  --  20*  GLUCOSE 155* 178* 139*  BUN '15 18 22  '$ CREATININE 1.10* 0.90 1.30*  CALCIUM 9.6  --  8.7*   GFR: Estimated Creatinine Clearance: 35.4 mL/min (A) (by C-G formula based on SCr of 1.3 mg/dL (H)). Liver Function Tests: Recent Labs  Lab 06/30/22 0021 07/01/22 0451  AST 34 51*  ALT 24 47*  ALKPHOS 44 40  BILITOT 0.5 1.0  PROT 7.5 7.2  ALBUMIN 4.1 3.6   No results for input(s): "LIPASE", "AMYLASE" in the last 168 hours. No results for input(s): "AMMONIA" in the last 168 hours. Coagulation Profile: Recent Labs  Lab 06/30/22 0021  INR 1.1   Cardiac Enzymes: No results for input(s): "CKTOTAL", "CKMB", "CKMBINDEX", "TROPONINI" in the last 168 hours. BNP (last 3 results) Recent Labs    02/21/22 1021  PROBNP 35.0   HbA1C: No results for input(s): "HGBA1C" in the last 72 hours. CBG: No results for input(s): "GLUCAP" in the last 168 hours. Lipid Profile: No results for input(s): "CHOL", "HDL", "LDLCALC", "TRIG", "CHOLHDL", "LDLDIRECT" in the last 72 hours. Thyroid Function Tests: No results for input(s): "TSH", "T4TOTAL", "FREET4", "T3FREE", "THYROIDAB" in the last 72 hours. Anemia Panel: No results for input(s): "VITAMINB12", "FOLATE", "FERRITIN", "TIBC", "IRON", "RETICCTPCT" in the last 72 hours. Sepsis Labs: No results for input(s): "PROCALCITON", "LATICACIDVEN" in the last 168 hours.  Recent Results (from the past 240 hour(s))  Resp panel by RT-PCR (RSV, Flu A&B, Covid) Anterior Nasal Swab     Status: None   Collection Time: 06/30/22 12:48 AM   Specimen: Anterior Nasal Swab  Result Value Ref Range Status    SARS Coronavirus 2 by RT PCR NEGATIVE NEGATIVE Final    Comment: (NOTE) SARS-CoV-2 target nucleic acids are NOT DETECTED.  The SARS-CoV-2 RNA is generally detectable in upper respiratory specimens during the acute phase of infection. The lowest concentration of SARS-CoV-2 viral copies this assay can detect is 138 copies/mL. A negative result does not preclude SARS-Cov-2 infection and should not be used as the sole basis for treatment or other patient management decisions. A negative result may occur with  improper specimen collection/handling, submission of specimen other than nasopharyngeal swab, presence of viral mutation(s) within the areas targeted by this assay, and inadequate number of viral copies(<138 copies/mL). A negative result must be combined with clinical observations, patient history, and epidemiological information. The expected result is Negative.  Fact Sheet for Patients:  EntrepreneurPulse.com.au  Fact Sheet for Healthcare Providers:  IncredibleEmployment.be  This test is no t yet approved or cleared by the [place]  FDA and  has been authorized for detection and/or diagnosis of SARS-CoV-2 by FDA under an Emergency Use Authorization (EUA). This EUA will remain  in effect (meaning this test can be used) for the duration of the COVID-19 declaration under Section 564(b)(1) of the Act, 21 U.S.C.section 360bbb-3(b)(1), unless the authorization is terminated  or revoked sooner.       Influenza A by PCR NEGATIVE NEGATIVE Final   Influenza B by PCR NEGATIVE NEGATIVE Final    Comment: (NOTE) The Xpert Xpress SARS-CoV-2/FLU/RSV plus assay is intended as an aid in the diagnosis of influenza from Nasopharyngeal swab specimens and should not be used as a sole basis for treatment. Nasal washings and aspirates are unacceptable for Xpert Xpress SARS-CoV-2/FLU/RSV testing.  Fact Sheet for  Patients: EntrepreneurPulse.com.au  Fact Sheet for Healthcare Providers: IncredibleEmployment.be  This test is not yet approved or cleared by the [place] FDA and has been authorized for detection and/or diagnosis of SARS-CoV-2 by FDA under an Emergency Use Authorization (EUA). This EUA will remain in effect (meaning this test can be used) for the duration of the COVID-19 declaration under Section 564(b)(1) of the Act, 21 U.S.C. section 360bbb-3(b)(1), unless the authorization is terminated or revoked.     Resp Syncytial Virus by PCR NEGATIVE NEGATIVE Final    Comment: (NOTE) Fact Sheet for Patients: EntrepreneurPulse.com.au  Fact Sheet for Healthcare Providers: IncredibleEmployment.be  This test is not yet approved or cleared by the [place] FDA and has been authorized for detection and/or diagnosis of SARS-CoV-2 by FDA under an Emergency Use Authorization (EUA). This EUA will remain in effect (meaning this test can be used) for the duration of the COVID-19 declaration under Section 564(b)(1) of the Act, 21 U.S.C. section 360bbb-3(b)(1), unless the authorization is terminated or revoked.  Performed at [facility], [place] [address] [place], [place] [address]          Radiology Studies: ECHOCARDIOGRAM COMPLETE  Result Date: 06/30/2022    ECHOCARDIOGRAM REPORT   Patient Name:   URIJAH RAYNOR Date of Exam: 06/30/2022 Medical Rec #:  [id number]       Height:       63.0 in Accession #:    [id number]      Weight:       185.0 lb Date of Birth:  Jul 03, 1940       BSA:          1.871 m Patient Age:    30 years        BP:           166/91 mmHg Patient Gender: F               HR:           104 bpm. Exam Location:  Inpatient Procedure: 2D Echo, Cardiac Doppler and Color Doppler Indications:    Pulmonary Embolus I26.09  History:        Patient has no prior history of Echocardiogram  examinations.                 Signs/Symptoms:Dyspnea, Chest Pain and Fatigue; Risk                 Factors:Dyslipidemia.  Sonographer:    Luane School RDCS Referring Phys: [id number] Samella Parr  Sonographer Comments: Technically difficult study due to poor echo windows. Image acquisition challenging due to respiratory motion and supine position. IMPRESSIONS  1. Left ventricular ejection fraction, by estimation, is 60 to 65%. The  left ventricle has normal function. The left ventricle has no regional wall motion abnormalities. There is moderate left ventricular hypertrophy. Left ventricular diastolic parameters are consistent with Grade I diastolic dysfunction (impaired relaxation).  2. Right ventricular systolic function is moderately reduced. The right ventricular size is normal. There is normal pulmonary artery systolic pressure. Free wall hypokinesis with normal apical systolic function consistent with McConnell's sign as can be  seen in acute PE  3. The mitral valve is normal in structure. No evidence of mitral valve regurgitation. No evidence of mitral stenosis.  4. The aortic valve is tricuspid. Aortic valve regurgitation is not visualized. Aortic valve sclerosis is present, with no evidence of aortic valve stenosis. FINDINGS  Left Ventricle: Left ventricular ejection fraction, by estimation, is 60 to 65%. The left ventricle has normal function. The left ventricle has no regional wall motion abnormalities. The left ventricular internal cavity size was small. There is moderate  left ventricular hypertrophy. Left ventricular diastolic parameters are consistent with Grade I diastolic dysfunction (impaired relaxation). Right Ventricle: The right ventricular size is normal. Right vetricular wall thickness was not well visualized. Right ventricular systolic function is moderately reduced. There is normal pulmonary artery systolic pressure. The tricuspid regurgitant velocity is 2.78 m/s, and with an assumed right atrial  pressure of 3 mmHg, the estimated right ventricular systolic pressure is 02.7 mmHg. Left Atrium: Left atrial size was normal in size. Right Atrium: Right atrial size was normal in size. Pericardium: Trivial pericardial effusion is present. Presence of epicardial fat layer. Mitral Valve: The mitral valve is normal in structure. No evidence of mitral valve regurgitation. No evidence of mitral valve stenosis. Tricuspid Valve: The tricuspid valve is normal in structure. Tricuspid valve regurgitation is trivial. Aortic Valve: The aortic valve is tricuspid. Aortic valve regurgitation is not visualized. Aortic valve sclerosis is present, with no evidence of aortic valve stenosis. Pulmonic Valve: The pulmonic valve was not well visualized. Pulmonic valve regurgitation is trivial. Aorta: The aortic root and ascending aorta are structurally normal, with no evidence of dilitation. IAS/Shunts: The interatrial septum was not well visualized.  LEFT VENTRICLE PLAX 2D LVIDd:         3.40 cm   Diastology LVIDs:         2.70 cm   LV e' medial:    4.43 cm/s LV PW:         1.50 cm   LV E/e' medial:  9.7 LV IVS:        1.50 cm   LV e' lateral:   3.98 cm/s LVOT diam:     2.00 cm   LV E/e' lateral: 10.8 LV SV:         44 LV SV Index:   24 LVOT Area:     3.14 cm  RIGHT VENTRICLE            IVC RV S prime:     8.81 cm/s  IVC diam: 1.50 cm TAPSE (M-mode): 1.8 cm LEFT ATRIUM           Index        RIGHT ATRIUM           Index LA diam:      3.40 cm 1.82 cm/m   RA Area:     10.10 cm LA Vol (A4C): 45.3 ml 24.22 ml/m  RA Volume:   19.40 ml  10.37 ml/m  AORTIC VALVE             PULMONIC VALVE LVOT  Vmax:   93.73 cm/s  PR End Diast Vel: 7.62 msec LVOT Vmean:  67.233 cm/s LVOT VTI:    0.141 m  AORTA Ao Root diam: 3.00 cm Ao Asc diam:  3.00 cm Ao Desc diam: 1.90 cm MITRAL VALVE               TRICUSPID VALVE MV Area (PHT): 4.57 cm    TR Peak grad:   30.9 mmHg MV Decel Time: 166 msec    TR Vmax:        278.00 cm/s MV E velocity: 42.80 cm/s MV A  velocity: 78.00 cm/s  SHUNTS MV E/A ratio:  0.55        Systemic VTI:  0.14 m                            Systemic Diam: 2.00 cm Oswaldo Milian MD Electronically signed by Oswaldo Milian MD Signature Date/Time: 06/30/2022/1:49:26 PM    Final    VAS [place] LOWER EXTREMITY VENOUS (DVT) (ONLY [facility] & [facility])  Result Date: 06/30/2022  Lower Venous DVT Study Patient Name:  PERSIS GRAFFIUS  Date of Exam:   06/30/2022 Medical Rec #: [id number]        Accession #:    [id number] Date of Birth: 1941/04/26        Patient Gender: F Patient Age:   89 years Exam Location:  [facility] Procedure:      VAS [place] LOWER EXTREMITY VENOUS (DVT) Referring Phys: Erin Hearing --------------------------------------------------------------------------------  Indications: Pulmonary embolism.  Limitations: Poor ultrasound/tissue interface. Comparison Study: No previous exams Performing Technologist: Jody Hill RVT, RDMS  Examination Guidelines: A complete evaluation includes B-mode imaging, spectral Doppler, color Doppler, and power Doppler as needed of all accessible portions of each vessel. Bilateral testing is considered an integral part of a complete examination. Limited examinations for reoccurring indications may be performed as noted. The reflux portion of the exam is performed with the patient in reverse Trendelenburg.  +---------+---------------+---------+-----------+----------+-------------------+ RIGHT    CompressibilityPhasicitySpontaneityPropertiesThrombus Aging      +---------+---------------+---------+-----------+----------+-------------------+ CFV      Full           Yes      Yes                                      +---------+---------------+---------+-----------+----------+-------------------+ SFJ      Full                                                             +---------+---------------+---------+-----------+----------+-------------------+ FV Prox  Full           Yes      Yes                                       +---------+---------------+---------+-----------+----------+-------------------+ FV Mid   Full           Yes      Yes                                      +---------+---------------+---------+-----------+----------+-------------------+  FV DistalFull           Yes      Yes                                      +---------+---------------+---------+-----------+----------+-------------------+ PFV      Full                                                             +---------+---------------+---------+-----------+----------+-------------------+ POP      None           No       No                   Acute               +---------+---------------+---------+-----------+----------+-------------------+ PTV      Full                                                             +---------+---------------+---------+-----------+----------+-------------------+ PERO                                                  Not well visualized +---------+---------------+---------+-----------+----------+-------------------+   +---------+---------------+---------+-----------+----------+-------------------+ LEFT     CompressibilityPhasicitySpontaneityPropertiesThrombus Aging      +---------+---------------+---------+-----------+----------+-------------------+ CFV      Full           Yes      Yes                                      +---------+---------------+---------+-----------+----------+-------------------+ SFJ      Full                                                             +---------+---------------+---------+-----------+----------+-------------------+ FV Prox  Full           Yes      Yes                                      +---------+---------------+---------+-----------+----------+-------------------+ FV Mid   Full           Yes      Yes                                       +---------+---------------+---------+-----------+----------+-------------------+ FV DistalFull           Yes      Yes                                      +---------+---------------+---------+-----------+----------+-------------------+  PFV      Full                                                             +---------+---------------+---------+-----------+----------+-------------------+ POP      Full                                                             +---------+---------------+---------+-----------+----------+-------------------+ PTV                                                   Not well visualized +---------+---------------+---------+-----------+----------+-------------------+ PERO     Full                                                             +---------+---------------+---------+-----------+----------+-------------------+    Summary: BILATERAL: -No evidence of popliteal cyst, bilaterally. RIGHT: - Findings consistent with acute deep vein thrombosis involving the right popliteal vein.  LEFT: - There is no evidence of deep vein thrombosis in the lower extremity.  *See table(s) above for measurements and observations.    Preliminary    DG Abd Portable 2 Views  Result Date: 06/30/2022 CLINICAL DATA:  82 year old female with history of abdominal distension and nausea. EXAM: PORTABLE ABDOMEN - 2 VIEW COMPARISON:  No priors. FINDINGS: Gas and stool are seen scattered throughout the colon extending to the level of the distal rectum. No pathologic distension of small bowel is noted. No gross evidence of pneumoperitoneum. Iodinated contrast material filling the lumen of the urinary bladder. IMPRESSION: 1. Nonobstructive bowel gas pattern. 2. No pneumoperitoneum. Electronically Signed   By: Vinnie Langton M.D.   On: 06/30/2022 10:37   CT Angio Chest PE W and/or Wo Contrast  Result Date: 06/30/2022 CLINICAL DATA:  Shortness of breath for the past week, worsening  today. EXAM: CT ANGIOGRAPHY CHEST WITH CONTRAST TECHNIQUE: Multidetector CT imaging of the chest was performed using the standard protocol during bolus administration of intravenous contrast. Multiplanar CT image reconstructions and MIPs were obtained to evaluate the vascular anatomy. RADIATION DOSE REDUCTION: This exam was performed according to the departmental dose-optimization program which includes automated exposure control, adjustment of the mA and/or kV according to patient size and/or use of iterative reconstruction technique. CONTRAST:  12m OMNIPAQUE IOHEXOL 350 MG/ML SOLN COMPARISON:  Portable chest today, chest radiograph 09/20/2021, CTA chest 02/25/2022. FINDINGS: Cardiovascular: There is mild cardiomegaly with a right chamber predominance and RV/LV ratio of 1.31 indicating right heart strain. The pulmonary trunk is 3 cm and both main pulmonary arteries approaching 3 cm indicating arterial hypertension. There are bilateral near-occlusive clots, almost certainly acute, in both distal main pulmonary arteries with extension into the bilateral lobar main arteries, segmental arteries and a few downstream subsegmental arterial branches with overall large clot burden. There  is no pericardial effusion. There is scattered calcification in the left main and LAD coronary artery. There is aortic atherosclerosis and tortuosity without aneurysm or dissection. The great vessels are not well seen due to metal spray artifact from bilateral shoulder replacements but they are grossly patent at least proximally. Pulmonary veins are decompressed. Mediastinum/Nodes: No enlarged mediastinal, hilar, or axillary lymph nodes. Thyroid gland, trachea, and esophagus demonstrate no significant findings. Surgical clips are again noted in the right axilla Lungs/Pleura: Difficult to evaluate due to abundant respiratory motion artifacts. There are trace pleural effusions. There is a calcified right lower lobe granuloma laterally.  Scattered linear scar-like opacities both bases. No confluent dense infiltrate is seen. There is no appreciable noncalcified nodule. No pleural thickening or pneumothorax. Upper Abdomen: Hepatic steatosis. No acute abnormality accounting for respiratory motion. Moderate pancreatic atrophy. Musculoskeletal: There is osteopenia, thoracic kyphosis and advanced degenerative change of the thoracic spine. 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 The ribcage is intact. Review of the MIP images confirms the above findings. IMPRESSION: 1. Positive for acute PE with CT evidence of right heart strain (RV/LV Ratio = 1.31 ) consistent with at least submassive (intermediate risk) PE. The presence of right heart strain has been associated with an increased risk of morbidity and mortality. Please refer to the "PE Focused" order set in EPIC. 2. Aortic and coronary artery atherosclerosis. 3. Trace pleural effusions. 4. Hepatic steatosis. 5. Results phoned to Dr. Leonette Monarch in the ED at 3:03 a.m., 06/30/2022, with verbal acknowledgement of the key findings. Aortic Atherosclerosis (ICD10-I70.0). Electronically Signed   By: Telford Nab M.D.   On: 06/30/2022 03:15   DG Chest Port 1 View  Result Date: 06/30/2022 CLINICAL DATA:  Shortness of breath x1 week. EXAM: PORTABLE CHEST 1 VIEW COMPARISON:  September 20, 2021 FINDINGS: The heart size and mediastinal contours are within normal limits. Low lung volumes are noted. Both lungs are clear. Radiopaque surgical clips are seen along the lateral aspect of the right chest wall. Bilateral shoulder replacements are seen. Multilevel degenerative changes are noted throughout the thoracic spine. IMPRESSION: Low lung volumes without active cardiopulmonary disease. Electronically Signed   By: Virgina Norfolk M.D.   On: 06/30/2022 00:55        Scheduled Meds:  donepezil  10 mg Oral QHS   memantine  10 mg Oral BID   pantoprazole  40 mg Oral Daily    sodium chloride flush  3 mL Intravenous Q12H   tamoxifen  20 mg Oral Daily   venlafaxine XR  150 mg Oral Daily   Continuous Infusions:  heparin 1,200 Units/hr (06/30/22 2152)     LOS: 1 day    Time spent: 67 min  Georgette Shell, MD 07/01/2022, 9:02 AM

## 2022-07-01 NOTE — Progress Notes (Signed)
ANTICOAGULATION CONSULT NOTE - Follow Up Consult  Pharmacy Consult for Heparin Indication:  DVT and PE  Allergies  Allergen Reactions   Latex Rash   Sulfa Antibiotics Anaphylaxis and Nausea Only   Sulfamethoxazole-Trimethoprim Anaphylaxis and Nausea Only   Aspirin Other (See Comments)    GI bleeding   Nsaids Other (See Comments)    GI upset    Oxycodone-Acetaminophen Nausea Only    Patient Measurements: Height: '5\' 3"'$  (160 cm) Weight: 83.9 kg (185 lb) IBW/kg (Calculated) : 52.4 Heparin Dosing Weight: 71 kg  Vital Signs: Temp: 97.4 F (36.3 C) (01/02 0704) Temp Source: Oral (01/02 0704) BP: 152/82 (01/02 0800) Pulse Rate: 99 (01/02 0800)  Labs: Recent Labs    06/30/22 0021 06/30/22 0104 06/30/22 0224 06/30/22 1152 06/30/22 1357 06/30/22 1605 06/30/22 1852 07/01/22 0451 07/01/22 0452  HGB 15.3* 15.0  --   --   --   --   --  13.6  --   HCT 47.2* 44.0  --   --   --   --   --  41.2  --   PLT 220  --   --   --   --   --   --  211  --   APTT 32  --   --   --   --   --   --   --   --   LABPROT 14.3  --   --   --   --   --   --   --   --   INR 1.1  --   --   --   --   --   --   --   --   HEPARINUNFRC  --   --   --  0.62  --   --  0.61  --  0.70  CREATININE 1.10* 0.90  --   --   --   --   --  1.30*  --   TROPONINIHS 209*  --  220*  --  122* 109*  --   --   --      Estimated Creatinine Clearance: 35.4 mL/min (A) (by C-G formula based on SCr of 1.3 mg/dL (H)).   Assessment: 82 yo female with breast cancer admitted with acute submassive bilateral PE with RV strain. Dopplers also with R popliteal DVT.  Pharmacy consulted to dose IV heparin.   Today, 07/01/2022 Heparin level 0.7, remains therapeutic on heparin 1200 units/hr CBC: WNL this AM SCr 0.9 >> 1.3 No bleeding reported Tamoxifen dc'd today due to acute PE> to f/u w/ Onc as outpatient  Goal of Therapy:  Heparin level 0.3-0.7 units/ml Monitor platelets by anticoagulation protocol: Yes   Plan:  Con't IV  heparin 1200 units/hr Daily HL and CBC Monitor for signs/symptoms of bleeding F/u transition to [place], Pharm.D Use secure chat for questions 07/01/2022 9:47 AM

## 2022-07-01 NOTE — Plan of Care (Signed)

## 2022-07-02 ENCOUNTER — Other Ambulatory Visit (HOSPITAL_COMMUNITY): Payer: Self-pay

## 2022-07-02 DIAGNOSIS — R7989 Other specified abnormal findings of blood chemistry: Secondary | ICD-10-CM | POA: Diagnosis not present

## 2022-07-02 DIAGNOSIS — F0390 Unspecified dementia without behavioral disturbance: Secondary | ICD-10-CM | POA: Diagnosis present

## 2022-07-02 DIAGNOSIS — I824Y1 Acute embolism and thrombosis of unspecified deep veins of right proximal lower extremity: Secondary | ICD-10-CM | POA: Diagnosis present

## 2022-07-02 DIAGNOSIS — F32A Depression, unspecified: Secondary | ICD-10-CM

## 2022-07-02 DIAGNOSIS — F419 Anxiety disorder, unspecified: Secondary | ICD-10-CM | POA: Diagnosis present

## 2022-07-02 DIAGNOSIS — K219 Gastro-esophageal reflux disease without esophagitis: Secondary | ICD-10-CM | POA: Diagnosis not present

## 2022-07-02 DIAGNOSIS — I2609 Other pulmonary embolism with acute cor pulmonale: Secondary | ICD-10-CM | POA: Diagnosis not present

## 2022-07-02 DIAGNOSIS — Z853 Personal history of malignant neoplasm of breast: Secondary | ICD-10-CM

## 2022-07-02 HISTORY — DX: Acute embolism and thrombosis of unspecified deep veins of right proximal lower extremity: I82.4Y1

## 2022-07-02 LAB — CBC
HCT: 39.4 % (ref 36.0–46.0)
Hemoglobin: 12.5 g/dL (ref 12.0–15.0)
MCH: 30.2 pg (ref 26.0–34.0)
MCHC: 31.7 g/dL (ref 30.0–36.0)
MCV: 95.2 fL (ref 80.0–100.0)
Platelets: 195 10*3/uL (ref 150–400)
RBC: 4.14 MIL/uL (ref 3.87–5.11)
RDW: 12.2 % (ref 11.5–15.5)
WBC: 10.6 10*3/uL — ABNORMAL HIGH (ref 4.0–10.5)
nRBC: 0 % (ref 0.0–0.2)

## 2022-07-02 LAB — COMPREHENSIVE METABOLIC PANEL
ALT: 53 U/L — ABNORMAL HIGH (ref 0–44)
AST: 45 U/L — ABNORMAL HIGH (ref 15–41)
Albumin: 3.2 g/dL — ABNORMAL LOW (ref 3.5–5.0)
Alkaline Phosphatase: 36 U/L — ABNORMAL LOW (ref 38–126)
Anion gap: 9 (ref 5–15)
BUN: 26 mg/dL — ABNORMAL HIGH (ref 8–23)
CO2: 22 mmol/L (ref 22–32)
Calcium: 8.5 mg/dL — ABNORMAL LOW (ref 8.9–10.3)
Chloride: 103 mmol/L (ref 98–111)
Creatinine, Ser: 1.22 mg/dL — ABNORMAL HIGH (ref 0.44–1.00)
GFR, Estimated: 45 mL/min — ABNORMAL LOW (ref >60)
Glucose, Bld: 124 mg/dL — ABNORMAL HIGH (ref 70–99)
Potassium: 3.8 mmol/L (ref 3.5–5.1)
Sodium: 134 mmol/L — ABNORMAL LOW (ref 135–145)
Total Bilirubin: 0.6 mg/dL (ref 0.3–1.2)
Total Protein: 6.4 g/dL — ABNORMAL LOW (ref 6.5–8.1)

## 2022-07-02 LAB — HEPARIN LEVEL (UNFRACTIONATED): Heparin Unfractionated: 0.65 IU/mL (ref 0.30–0.70)

## 2022-07-02 NOTE — Progress Notes (Signed)
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  She was found to have bilateral acute PE near occlusive clots in both distal main pulmonary arteries with extension into the bilateral lobar main arteries and segmental arteries and a few downstream subsegmental arterial branches with overall large clot burden.  Associated with RV strain.  Venous Doppler shows right lower extremity DVT.  She denies recent travel or surgery.   Echocardiogram-ejection fraction 60 to 65%.   Grade 1 diastolic dysfunction. Her husband passed away not too long ago. She is now 95% on 2 L.   Assessment & Plan:   Principal Problem:   Bilateral pulmonary embolism (HCC) Active Problems:   Pulmonary embolism (HCC)   Acute deep vein thrombosis (DVT) of proximal vein of right lower extremity (HCC)   Elevated troponin   Anxiety and depression   Dementia without behavioral disturbance (HCC)   HX: breast cancer  #1 acute submassive bilateral PE/right lower extremity DVT -Patient presented with worsening shortness of breath, CT angiogram chest done concerning for bilateral acute PE with near occlusive clots in both distal main pulmonary arteries with extension into the bilateral lower main arteries and segmental arteries with few downstream subsegmental arterial branches with overall large clot burden with associated right ventricular strain. -2D echo done positive for McConnell sign, EF of 60 to 81%, grade 1 diastolic dysfunction. -Lower extremity Dopplers done positive for DVT. -.  Disposing factors include history of breast cancer, on tamoxifen, sedentary life recently. -Patient  slowly improving clinically, continue heparin drip to treat for total of 72 hours and if continued improvement could likely transition to oral DOAC/Eliquis hopefully tomorrow. -Check ambulatory sats tomorrow. -Supportive care.  2.  Elevated troponins -Likely secondary to problem #1. -EKG with no acute changes. -Cardiac cath done in 2021 with normal coronaries. -2D echo with a EF of 60 to 65%, moderate LVH,NWMA, grade 1 diastolic dysfunction, moderately reduced right ventricular systolic function, free wall hypokinesis with normal apical systolic function consistent with McConnell sign. -Continue treatment as in problem #1.  3.  History of breast cancer on tamoxifen -Tamoxifen on hold. -Patient is supposed to finish her 5 years of tamoxifen in October and followed up with oncology in October. -Prior hospitalist/MD, Dr. Zigmund  discussed with Dr. Benay Spice who was in agreement with continuation of anticoagulation, holding tamoxifen with outpatient follow-up with oncology.  4.  Dementia -Aricept/Namenda.  5.  GERD -PPI.  6.  Hyperlipidemia -Not on statin. -Outpatient follow-up with PCP.  7.  Depression/anxiety -Effexor.   DVT prophylaxis: Heparin Code Status: Full Family Communication: Updated patient.  No family at bedside. Disposition: Likely home when clinically improved.  Status is: Inpatient Remains inpatient appropriate because: Severity of illness   Consultants:  None  Procedures:  Ct angio chest 06/30/2022 Abdominal films 06/30/2022 Chest x-ray 06/30/2022 2D echo 06/30/2022 Lower extremity Dopplers 06/30/2022  Antimicrobials:  Anti-infectives (From admission, onward)    None         Subjective: Sitting up eating burger. No CP. Improving SOB. No BLEEDING.  Overall slowly feeling better than she did on admission.  Objective: Vitals:   07/02/22 0500 07/02/22 0643 07/02/22 1202 07/02/22 1958  BP:  124/76 138/70 130/60  Pulse:  91 89 91  Resp:  '19 16 20  '$ Temp:   98 F (36.7 C) 98.2 F (36.8 C) 98.5 F (36.9 C)  TempSrc:  Oral Oral Oral  SpO2:  98% 96% 92%  Weight: 84 kg     Height:        Intake/Output Summary (Last 24 hours) at 07/02/2022 2138 Last data filed at 07/02/2022 1615 Gross per 24 hour  Intake 509.02 ml  Output 200 ml  Net 309.02 ml   Filed Weights   06/30/22 0013 07/02/22 0500  Weight: 83.9 kg 84 kg    Examination:  General exam: Appears calm and comfortable  Respiratory system: Clear to auscultation.No wheezes, no crackles, no rhonchi.fair air movement.  Speaking in full sentences.  No use of accessory muscles of respiration.  Respiratory effort normal. Cardiovascular system: S1 & S2 heard, RRR. No JVD, murmurs, rubs, gallops or clicks. No pedal edema. Gastrointestinal system: Abdomen is nondistended, soft and nontender. No organomegaly or masses felt. Normal bowel sounds heard. Central nervous system: Alert and oriented. No focal neurological deficits. Extremities: Symmetric 5 x 5 power. Skin: No rashes, lesions or ulcers Psychiatry: Judgement and insight appear normal. Mood & affect appropriate.     Data Reviewed: I have personally reviewed following labs and imaging studies  CBC: Recent Labs  Lab 06/30/22 0021 06/30/22 0104 07/01/22 0451 07/02/22 0436  WBC 12.3*  --  11.3* 10.6*  NEUTROABS 8.1*  --   --   --   HGB 15.3* 15.0 13.6 12.5  HCT 47.2* 44.0 41.2 39.4  MCV 92.0  --  92.2 95.2  PLT 220  --  211 397    Basic Metabolic Panel: Recent Labs  Lab 06/30/22 0021 06/30/22 0104 07/01/22 0451 07/02/22 0436  NA 138 138 134* 134*  K 3.9 4.2 4.0 3.8  CL 103 105 104 103  CO2 17*  --  20* 22  GLUCOSE 155* 178* 139* 124*  BUN '15 18 22 '$ 26*  CREATININE 1.10* 0.90 1.30* 1.22*  CALCIUM 9.6  --  8.7* 8.5*    GFR: Estimated Creatinine Clearance: 37.7 mL/min (A) (by C-G formula based on SCr of 1.22 mg/dL (H)).  Liver Function Tests: Recent Labs  Lab 06/30/22 0021 07/01/22 0451 07/02/22 0436  AST 34  51* 45*  ALT 24 47* 53*  ALKPHOS 44 40 36*  BILITOT 0.5 1.0 0.6  PROT 7.5 7.2 6.4*  ALBUMIN 4.1 3.6 3.2*    CBG: No results for input(s): "GLUCAP" in the last 168 hours.   Recent Results (from the past 240 hour(s))  Resp panel by RT-PCR (RSV, Flu A&B, Covid) Anterior Nasal Swab     Status: None   Collection Time: 06/30/22 12:48 AM   Specimen: Anterior Nasal Swab  Result Value Ref Range Status   SARS Coronavirus 2 by RT PCR NEGATIVE NEGATIVE Final    Comment: (NOTE) SARS-CoV-2 target nucleic acids are NOT DETECTED.  The SARS-CoV-2 RNA is generally detectable in upper respiratory specimens during the acute phase of infection. The lowest concentration of SARS-CoV-2 viral copies this assay can detect is 138 copies/mL. A negative result does not preclude SARS-Cov-2 infection and should not be used as the sole basis for treatment or other patient management decisions. A negative result may occur with  improper specimen collection/handling, submission of specimen other than nasopharyngeal swab, presence of viral mutation(s) within the areas targeted by this assay, and inadequate number of viral copies(<138 copies/mL). A  negative result must be combined with clinical observations, patient history, and epidemiological information. The expected result is Negative.  Fact Sheet for Patients:  EntrepreneurPulse.com.au  Fact Sheet for Healthcare Providers:  IncredibleEmployment.be  This test is no t yet approved or cleared by the [place] FDA and  has been authorized for detection and/or diagnosis of SARS-CoV-2 by FDA under an Emergency Use Authorization (EUA). This EUA will remain  in effect (meaning this test can be used) for the duration of the COVID-19 declaration under Section 564(b)(1) of the Act, 21 U.S.C.section 360bbb-3(b)(1), unless the authorization is terminated  or revoked sooner.       Influenza A by PCR NEGATIVE NEGATIVE Final    Influenza B by PCR NEGATIVE NEGATIVE Final    Comment: (NOTE) The Xpert Xpress SARS-CoV-2/FLU/RSV plus assay is intended as an aid in the diagnosis of influenza from Nasopharyngeal swab specimens and should not be used as a sole basis for treatment. Nasal washings and aspirates are unacceptable for Xpert Xpress SARS-CoV-2/FLU/RSV testing.  Fact Sheet for Patients: EntrepreneurPulse.com.au  Fact Sheet for Healthcare Providers: IncredibleEmployment.be  This test is not yet approved or cleared by the [place] FDA and has been authorized for detection and/or diagnosis of SARS-CoV-2 by FDA under an Emergency Use Authorization (EUA). This EUA will remain in effect (meaning this test can be used) for the duration of the COVID-19 declaration under Section 564(b)(1) of the Act, 21 U.S.C. section 360bbb-3(b)(1), unless the authorization is terminated or revoked.     Resp Syncytial Virus by PCR NEGATIVE NEGATIVE Final    Comment: (NOTE) Fact Sheet for Patients: EntrepreneurPulse.com.au  Fact Sheet for Healthcare Providers: IncredibleEmployment.be  This test is not yet approved or cleared by the [place] FDA and has been authorized for detection and/or diagnosis of SARS-CoV-2 by FDA under an Emergency Use Authorization (EUA). This EUA will remain in effect (meaning this test can be used) for the duration of the COVID-19 declaration under Section 564(b)(1) of the Act, 21 U.S.C. section 360bbb-3(b)(1), unless the authorization is terminated or revoked.  Performed at [facility], [place] [address], [place], [place] [address]   MRSA Next Gen by PCR, Nasal     Status: None   Collection Time: 07/01/22  2:45 PM   Specimen: Nasal Mucosa; Nasal Swab  Result Value Ref Range Status   MRSA by PCR Next Gen NOT DETECTED NOT DETECTED Final    Comment: (NOTE) The GeneXpert MRSA Assay (FDA approved  for NASAL specimens only), is one component of a comprehensive MRSA colonization surveillance program. It is not intended to diagnose MRSA infection nor to guide or monitor treatment for MRSA infections. Test performance is not FDA approved in patients less than 40 years old. Performed at [facility], [place] [address], [place], [place] [address]          Radiology Studies: US RENAL  Result Date: 07/01/2022 CLINICAL DATA:  82 year old female with elevated serum creatinine. EXAM: RENAL / URINARY TRACT ULTRASOUND COMPLETE COMPARISON:  09/11/2021 CT. FINDINGS: Right Kidney: Renal measurements: 9.5 x 5.3 x 5.7 cm = volume: 149 mL. Echogenicity within normal limits. Cortical thinning is noted. There is no evidence of hydronephrosis or solid mass. Left Kidney: Renal measurements: 10.3 x 4.6 x 4.7 cm = volume: 116 mL. Echogenicity is within normal limits. Cortical thinning is noted. There is no evidence of hydronephrosis or solid renal mass. Bladder: Appears normal for degree of bladder distention. Other: None. IMPRESSION: 1. Bilateral renal cortical  thinning. Renal echogenicity within normal limits. 2. No evidence of hydronephrosis or solid renal mass. Electronically Signed   By: Margarette Canada M.D.   On: 07/01/2022 10:07        Scheduled Meds:  donepezil  10 mg Oral QHS   memantine  10 mg Oral BID   pantoprazole  40 mg Oral Daily   sodium chloride flush  3 mL Intravenous Q12H   venlafaxine XR  150 mg Oral Daily   Continuous Infusions:  heparin 1,200 Units/hr (07/02/22 1615)     LOS: 2 days    Time spent: 40 mins    Irine Seal, MD [facility]   To contact the attending provider between 7A-7P or the covering provider during after hours 7P-7A, please log into the web site [email or web address] and access using universal [place] password for that web site. If you do not have the password, please call the hospital operator.  07/02/2022, 9:38 PM

## 2022-07-02 NOTE — Progress Notes (Signed)
ANTICOAGULATION CONSULT NOTE - Follow Up Consult  Pharmacy Consult for Heparin Indication:  DVT and PE  Allergies  Allergen Reactions   Latex Rash   Sulfa Antibiotics Anaphylaxis and Nausea Only   Sulfamethoxazole-Trimethoprim Anaphylaxis and Nausea Only   Aspirin Other (See Comments)    GI bleeding   Nsaids Other (See Comments)    GI upset    Oxycodone-Acetaminophen Nausea Only    Patient Measurements: Height: '5\' 3"'$  (160 cm) Weight: 84 kg (185 lb 3 oz) IBW/kg (Calculated) : 52.4 Heparin Dosing Weight: 71 kg  Vital Signs: Temp: 98 F (36.7 C) (01/03 0643) Temp Source: Oral (01/03 0643) BP: 124/76 (01/03 0643) Pulse Rate: 91 (01/03 0643)  Labs: Recent Labs    06/30/22 0021 06/30/22 0104 06/30/22 0224 06/30/22 1152 06/30/22 1357 06/30/22 1605 06/30/22 1852 07/01/22 0451 07/01/22 0452 07/02/22 0436  HGB 15.3* 15.0  --   --   --   --   --  13.6  --  12.5  HCT 47.2* 44.0  --   --   --   --   --  41.2  --  39.4  PLT 220  --   --   --   --   --   --  211  --  195  APTT 32  --   --   --   --   --   --   --   --   --   LABPROT 14.3  --   --   --   --   --   --   --   --   --   INR 1.1  --   --   --   --   --   --   --   --   --   HEPARINUNFRC  --   --   --    < >  --   --  0.61  --  0.70 0.65  CREATININE 1.10* 0.90  --   --   --   --   --  1.30*  --  1.22*  TROPONINIHS 209*  --  220*  --  122* 109*  --   --   --   --    < > = values in this interval not displayed.     Estimated Creatinine Clearance: 37.7 mL/min (A) (by C-G formula based on SCr of 1.22 mg/dL (H)).   Assessment: 82 yo female with breast cancer admitted with acute submassive bilateral PE with RV strain. Dopplers also with R popliteal DVT.  Pharmacy consulted to dose IV heparin.   Today, 07/02/2022 Heparin level 0.65, remains therapeutic on heparin 1200 units/hr CBC okay No bleeding or infusion issues noted Tamoxifen discontinued 1/2 due to acute PE - to f/u w/ Onc as outpatient  Goal of Therapy:   Heparin level 0.3-0.7 units/ml Monitor platelets by anticoagulation protocol: Yes   Plan:  -Continue IV heparin 1200 units/hr -Daily HL and CBC -Monitor for signs/symptoms of bleeding -F/u transition to oral anticoagulation  Tawnya Crook, PharmD, BCPS Clinical Pharmacist 07/02/2022 8:43 AM

## 2022-07-02 NOTE — Progress Notes (Signed)
Mobility Specialist - Progress Note   07/02/22 1400  Mobility  Activity Ambulated with assistance in hallway  Level of Assistance Minimal assist, patient does 75% or more  Assistive Device Front wheel walker  Distance Ambulated (ft) 96 ft  Range of Motion/Exercises Active  Activity Response Tolerated well  Mobility Referral Yes  $Mobility charge 1 Mobility   Pt was found in bed and agreeable to ambulate. Daughter stated pt uses cane at baseline but that it was left in her car. Was stand by for ambulation and upon returning to room was min-A in placing her feet on the bed. At EOS returned to bed with all necessities in reach and daughter in room.  Ferd Hibbs Mobility Specialist

## 2022-07-02 NOTE — TOC Benefit Eligibility Note (Signed)
Patient Teacher, English as a foreign language completed.    The patient is currently admitted and upon discharge could be taking Eliquis 5 mg.  The current 30 day co-pay is $551.33 due to a $545.00 deductible.   The patient is insured through [place] [place] Patient Advocate Specialist [place] Patient Advocate Team Direct Number: [phone]  Fax: [phone]

## 2022-07-02 NOTE — TOC Initial Note (Signed)
Transition of Care [facility]) - Initial/Assessment Note    Patient Details  Name: Brenda Stephens MRN: [id number] Date of Birth: 01/14/1941  Transition of [facility]) CM/SW Contact:    Brenda Regulus, LCSW Phone Number: 07/02/2022, 3:58 PM  Clinical Narrative:                 TOC CSW met with pt and pt's daughter to discuss PCP. CSW provided pt with PCP list of [facility] providers. CSW also informed pt and her daughter of how to access list on the internet. No additional TOC needs .  Expected Discharge Plan:  (TBA) Barriers to Discharge: Continued Medical Work up   Patient Goals and CMS Choice Patient states their goals for this hospitalization and ongoing recovery are:: return home CMS Medicare.gov Compare Post Acute Care list provided to:: Patient Choice offered to / list presented to : Patient, Adult Children      Expected Discharge Plan and Services In-house Referral: PCP / Health Connect     Living arrangements for the past 2 months: Single Family Home                                      Prior Living Arrangements/Services Living arrangements for the past 2 months: Single Family Home Lives with:: Self Patient language and need for interpreter reviewed:: Yes Do you feel safe going back to the place where you live?: Yes      Need for Family Participation in Patient Care: No (Comment) Care giver support system in place?: No (comment)   Criminal Activity/Legal Involvement Pertinent to Current Situation/Hospitalization: No - Comment as needed  Activities of Daily Living Home Assistive Devices/Equipment: None ADL Screening (condition at time of admission) Patient's cognitive ability adequate to safely complete daily activities?: Yes Is the patient deaf or have difficulty hearing?: Yes Does the patient have difficulty seeing, even when wearing glasses/contacts?: No Does the patient have difficulty concentrating, remembering, or making decisions?: No Patient able  to express need for assistance with ADLs?: No Does the patient have difficulty dressing or bathing?: Yes Independently performs ADLs?: No Communication: Independent Dressing (OT): Independent Grooming: Independent Feeding: Independent Bathing: Needs assistance Is this a change from baseline?: Change from baseline, expected to last <3 days Toileting: Needs assistance Is this a change from baseline?: Change from baseline, expected to last <3 days In/Out Bed: Needs assistance Is this a change from baseline?: Change from baseline, expected to last <3 days Walks in Home: Independent Does the patient have difficulty walking or climbing stairs?: Yes Weakness of Legs: Both Weakness of Arms/Hands: Both  Permission Sought/Granted                  Emotional Assessment Appearance:: Appears stated age Attitude/Demeanor/Rapport: Gracious Affect (typically observed): Accepting Orientation: : Oriented to Self, Oriented to Place, Oriented to  Time, Oriented to Situation Alcohol / Substance Use: Not Applicable Psych Involvement: No (comment)  Admission diagnosis:  Pulmonary embolism (HCC) [I26.99] Bilateral pulmonary embolism (HCC) [I26.99] Patient Active Problem List   Diagnosis Date Noted   Pulmonary embolism ([place]) 06/30/2022   Bilateral pulmonary embolism ([place]) 06/30/2022   Right lower lobe pulmonary nodule 10/24/2021   Palpitations 01/03/2021   DOE (dyspnea on exertion) 10/12/2019   Unstable angina ([place]) 08/02/2019   Rotator cuff arthropathy, left 01/12/2019   Vertigo 02/04/2018   Leg swelling 12/17/2017   Acute bronchitis 08/04/2017   Malignant  neoplasm of upper-outer quadrant of right breast in female, estrogen receptor positive ([place]) 03/13/2017   Acute frontal sinusitis 05/13/2013   Chest pain of uncertain etiology 10/02/5911   Fasting hyperglycemia 06/13/2011   HYPOTHYROIDISM 05/06/2010   Hyperlipidemia 05/06/2010   ARTHRITIS, SHOULDER 05/06/2010   FATIGUE, CHRONIC  05/06/2010   Seasonal and perennial allergic rhinitis 03/14/2010   ANEMIA-IRON DEFICIENCY 10/29/2009   Essential hypertension 10/29/2009   GERD 10/29/2009   COLONIC POLYPS, HX OF 68/59/9234   UMBILICAL HERNIA, HX OF 14/43/6016   PCP:  Brenda Baptist, PA-C Pharmacy:   [id number] [address]Phone: [phone]: [phone]    Social Determinants of Health ([place] Social History: [place] No Food Insecurity (07/01/2022)  Housing: Low Risk  (07/01/2022)  Transportation Needs: No Transportation Needs (07/01/2022)  Utilities: Not At Risk (07/01/2022)  Tobacco Use: Medium Risk (06/30/2022)   SDOH Interventions:     Readmission Risk Interventions     No data to display

## 2022-07-02 NOTE — Plan of Care (Signed)

## 2022-07-03 DIAGNOSIS — I824Y1 Acute embolism and thrombosis of unspecified deep veins of right proximal lower extremity: Secondary | ICD-10-CM | POA: Diagnosis not present

## 2022-07-03 DIAGNOSIS — K219 Gastro-esophageal reflux disease without esophagitis: Secondary | ICD-10-CM | POA: Diagnosis not present

## 2022-07-03 DIAGNOSIS — I2609 Other pulmonary embolism with acute cor pulmonale: Secondary | ICD-10-CM | POA: Diagnosis not present

## 2022-07-03 DIAGNOSIS — R7989 Other specified abnormal findings of blood chemistry: Secondary | ICD-10-CM | POA: Diagnosis not present

## 2022-07-03 LAB — COMPREHENSIVE METABOLIC PANEL
ALT: 57 U/L — ABNORMAL HIGH (ref 0–44)
AST: 44 U/L — ABNORMAL HIGH (ref 15–41)
Albumin: 3.3 g/dL — ABNORMAL LOW (ref 3.5–5.0)
Alkaline Phosphatase: 32 U/L — ABNORMAL LOW (ref 38–126)
Anion gap: 7 (ref 5–15)
BUN: 28 mg/dL — ABNORMAL HIGH (ref 8–23)
CO2: 23 mmol/L (ref 22–32)
Calcium: 8.8 mg/dL — ABNORMAL LOW (ref 8.9–10.3)
Chloride: 105 mmol/L (ref 98–111)
Creatinine, Ser: 1.22 mg/dL — ABNORMAL HIGH (ref 0.44–1.00)
GFR, Estimated: 45 mL/min — ABNORMAL LOW (ref >60)
Glucose, Bld: 123 mg/dL — ABNORMAL HIGH (ref 70–99)
Potassium: 4.2 mmol/L (ref 3.5–5.1)
Sodium: 135 mmol/L (ref 135–145)
Total Bilirubin: 0.6 mg/dL (ref 0.3–1.2)
Total Protein: 6.3 g/dL — ABNORMAL LOW (ref 6.5–8.1)

## 2022-07-03 LAB — CBC
HCT: 37.2 % (ref 36.0–46.0)
Hemoglobin: 11.7 g/dL — ABNORMAL LOW (ref 12.0–15.0)
MCH: 29.7 pg (ref 26.0–34.0)
MCHC: 31.5 g/dL (ref 30.0–36.0)
MCV: 94.4 fL (ref 80.0–100.0)
Platelets: 175 10*3/uL (ref 150–400)
RBC: 3.94 MIL/uL (ref 3.87–5.11)
RDW: 11.9 % (ref 11.5–15.5)
WBC: 8.9 10*3/uL (ref 4.0–10.5)
nRBC: 0 % (ref 0.0–0.2)

## 2022-07-03 LAB — HEPARIN LEVEL (UNFRACTIONATED): Heparin Unfractionated: 0.6 IU/mL (ref 0.30–0.70)

## 2022-07-03 LAB — MAGNESIUM: Magnesium: 2.1 mg/dL (ref 1.7–2.4)

## 2022-07-03 MED ORDER — APIXABAN 5 MG PO TABS: 5.0000 mg | ORAL_TABLET | Freq: Two times a day (BID) | ORAL | Status: DC

## 2022-07-03 MED ORDER — APIXABAN 5 MG PO TABS
10.0000 mg | ORAL_TABLET | Freq: Two times a day (BID) | ORAL | Status: DC
  Administered 2022-07-03 – 2022-07-05 (×5): 10 mg via ORAL
  Filled 2022-07-03 (×6): qty 2

## 2022-07-03 NOTE — Progress Notes (Signed)
SaO2 on room air at rest = 97% SaO2 on room air while ambulating = 97%  No supplemental oxygen required during ambulation.

## 2022-07-03 NOTE — Evaluation (Signed)
Occupational Therapy Evaluation and Discharge Patient Details Name: Brenda Stephens MRN: [id number] DOB: [birth year] Today's Date: 07/03/2022   History of Present Illness 82 year old female with past medical history of right breast cancer on tamoxifen, hyperlipidemia and GERD admitted with gradually worsening shortness of breath.  She was found to have bilateral acute PE near occlusive clots in both distal main pulmonary arteries with extension into the bilateral lobar main arteries and segmental arteries and a few downstream subsegmental arterial branches with overall large clot burden.  Associated with RV strain.  Venous Doppler shows right lower extremity DVT.   Clinical Impression   This 82 yo female admitted with above presents to acute OT with PLOF of being totally independent with all basic ADLs and IADLs (quit driving about 3 months ago due to selling one of their cars--dtr drives her wherever she needs). Currently based off of PT eval she is at a min guard -S level at RW level for ambulation not wanting to get up at present due to worn out. She reports her dtr will be with her 24/7 and can A prn--pt also reports her dtr has helped her up to bathroom 2 times today. All education completed on energy conservation and handout provided. All questions and concerns concerning her being able to manage her care at home. Acute OT will sign off.      Recommendations for follow up therapy are one component of a multi-disciplinary discharge planning process, led by the attending physician.  Recommendations may be updated based on patient status, additional functional criteria and insurance authorization.   Follow Up Recommendations  No OT follow up     Assistance Recommended at Discharge PRN  Patient can return home with the following Assistance with cooking/housework;Assist for transportation;Help with stairs or ramp for entrance    Functional Status Assessment  Patient has had a recent decline in  their functional status and demonstrates the ability to make significant improvements in function in a reasonable and predictable amount of time. (without futher need for OT)  Equipment Recommendations  Other (comment) (RW)       Precautions / Restrictions Precautions Precautions: None Restrictions Weight Bearing Restrictions: No      Mobility Bed Mobility               General bed mobility comments: pt not wanting to get up due to being worn out    Transfers                   General transfer comment: pt not wanting to get up due to being worn out          ADL either performed or assessed with clinical judgement   ADL                                         General ADL Comments: Pt reports her dtr will be with her at home all the time and can help her prn. We went over energy conservation strategies and handout provided. She is a bit concerned about going up and down her steps in the house at home to get ot her bedroom. We talked about going up at night but then staying down all day (so she is only going up and down them once a day). We also talked about placing a chair on the landing between the two sets of steps so  she can sit down and rest if need. In our discussion we also decided it would be good to have a RW upstairs and one downstairs, so I secure chatted CM/SW that had set up [facility] therapies and made her aware.     Vision Patient Visual Report: No change from baseline              Pertinent Vitals/Pain Pain Assessment Pain Assessment: No/denies pain     Hand Dominance Right   Extremity/Trunk Assessment Upper Extremity Assessment Upper Extremity Assessment: Overall WFL for tasks assessed           Communication Communication Communication: No difficulties   Cognition Arousal/Alertness: Awake/alert Behavior During Therapy: WFL for tasks assessed/performed Overall Cognitive Status: Within Functional Limits for tasks  assessed                                       General Comments  pt not wanting to get up due to being worn out            Home Living Family/patient expects to be discharged to:: Private residence Living Arrangements: Children (dtr) Available Help at Discharge: Family;Available 24 hours/day Type of Home: House       Home Layout: Bed/bath upstairs;Two level Alternate Level Stairs-Number of Steps: stairs, landing, stairs Alternate Level Stairs-Rails: Right Bathroom Shower/Tub: Walk-in shower;Door   Bathroom Toilet: Handicapped height     Home Equipment: Conservation officer, nature (2 wheels);Shower seat;Hand held shower head;Grab bars - toilet;Grab bars - tub/shower          Prior Functioning/Environment Prior Level of Function : Independent/Modified Independent                        OT Problem List: Cardiopulmonary status limiting activity         OT Goals(Current goals can be found in the care plan section) Acute Rehab OT Goals Patient Stated Goal: to possibly go home tomorrow         AM-PAC OT "6 Clicks" Daily Activity     Outcome Measure Help from another person eating meals?: None Help from another person taking care of personal grooming?: A Little Help from another person toileting, which includes using toliet, bedpan, or urinal?: A Little Help from another person bathing (including washing, rinsing, drying)?: A Little Help from another person to put on and taking off regular upper body clothing?: A Little Help from another person to put on and taking off regular lower body clothing?: A Little 6 Click Score: 19   End of Session    Activity Tolerance: Patient limited by fatigue Patient left: in bed;with call bell/phone within reach  OT Visit Diagnosis: Muscle weakness (generalized) (M62.81)                Time: 8032-1224 OT Time Calculation (min): 21 min Charges:  OT General Charges $OT Visit: 1 Visit OT Evaluation $OT Eval Low  Complexity: 1 Low  Brenda Stephens, OTR/L Acute Rehab Services [facility] [phone] Office [phone]    Brenda Stephens 07/03/2022, 5:49 PM

## 2022-07-03 NOTE — TOC Benefit Eligibility Note (Signed)
Transition of Care [facility]) Benefit Eligibility Note    Patient Details  Name: SCOTLYN MCCRANIE MRN: [id number] Date of Birth: 06-08-1941   Medication/Dose: Eliquis 5 mg po qd x 30 days  Covered?: Yes  Tier: 3 Drug  Prescription Coverage Preferred Pharmacy: local  Spoke with Person/Company/Phone Number:: [employer]/ [facility] [employer] [phone]  Co-Pay: $551.00  Prior Approval: No  Deductible: Unmet (Patient has a $545.00 deductable that has not been met)  Additional Notes: after $545.00 deductable is met next refill will be $93.00    Kerin Salen Phone Number: 07/03/2022, 1:47 PM

## 2022-07-03 NOTE — Progress Notes (Signed)
PROGRESS NOTE    Brenda Stephens  HDQ:[id number] DOB: 1940/11/14 DOA: 06/30/2022 PCP: Jola Baptist, PA-C    Chief Complaint  Patient presents with   Shortness of Breath    Brief Narrative:  82 year old female lives at home alone, has a daughter nearby with history of right breast cancer on tamoxifen, hyperlipidemia and GERD admitted with gradually worsening shortness of breath.  She was found to have bilateral acute PE near occlusive clots in both distal main pulmonary arteries with extension into the bilateral lobar main arteries and segmental arteries and a few downstream subsegmental arterial branches with overall large clot burden.  Associated with RV strain.  Venous Doppler shows right lower extremity DVT.  She denies recent travel or surgery.   Echocardiogram-ejection fraction 60 to 65%.   Grade 1 diastolic dysfunction. Her husband passed away not too long ago. She is now 95% on 2 L.   Assessment & Plan:   Principal Problem:   Bilateral pulmonary embolism (HCC) Active Problems:   Pulmonary embolism (HCC)   Acute deep vein thrombosis (DVT) of proximal vein of right lower extremity (HCC)   Elevated troponin   Anxiety and depression   Dementia without behavioral disturbance (HCC)   HX: breast cancer  #1 acute submassive bilateral PE/right lower extremity DVT -Patient presented with worsening shortness of breath, CT angiogram chest done concerning for bilateral acute PE with near occlusive clots in both distal main pulmonary arteries with extension into the bilateral lower main arteries and segmental arteries with few downstream subsegmental arterial branches with overall large clot burden with associated right ventricular strain. -2D echo done positive for McConnell sign, EF of 60 to 11%, grade 1 diastolic dysfunction. -Lower extremity Dopplers done positive for DVT. -.  Disposing factors include history of breast cancer, on tamoxifen, sedentary life recently. -Patient  improving clinically, currently on room air, states shortness of breath has improved.   -Patient has had 72 hours of heparin and as such we will transition to Eliquis today.   -Check ambulatory sats.   -Supportive care.   2.  Elevated troponins -Likely secondary to problem #1. -EKG with no acute changes. -Cardiac cath done in 2021 with normal coronaries. -2D echo with a EF of 60 to 65%, moderate LVH,NWMA, grade 1 diastolic dysfunction, moderately reduced right ventricular systolic function, free wall hypokinesis with normal apical systolic function consistent with McConnell sign. -Continue treatment as in problem #1.  3.  History of breast cancer on tamoxifen -Tamoxifen on hold. -Patient is supposed to finish her 5 years of tamoxifen in October and followed up with oncology in October. -Prior hospitalist/MD, Dr. Zigmund  discussed with Dr. Benay Spice who was in agreement with continuation of anticoagulation, holding tamoxifen with outpatient follow-up with oncology.  4.  Dementia -Continue Aricept/Namenda.  5.  GERD -Continue PPI.  6.  Hyperlipidemia -Not on statin. -Outpatient follow-up with PCP.  7.  Depression/anxiety -Stable.   -Continue home regimen Effexor.   DVT prophylaxis: Heparin>>>> Eliquis Code Status: Full Family Communication: Updated patient.  No family at bedside. Disposition: Likely home when clinically improved hopefully in the next 24 hours..  Status is: Inpatient Remains inpatient appropriate because: Severity of illness   Consultants:  None  Procedures:  Ct angio chest 06/30/2022 Abdominal films 06/30/2022 Chest x-ray 06/30/2022 2D echo 06/30/2022 Lower extremity Dopplers 06/30/2022  Antimicrobials:  Anti-infectives (From admission, onward)    None         Subjective: Sitting up in recliner.  States feeling better today.  Feels shortness of breath is improving.  Denies any chest pain.  No abdominal pain.  Asking when she is going to be discharged.    Objective: Vitals:   07/02/22 0643 07/02/22 1202 07/02/22 1958 07/03/22 0610  BP: 124/76 138/70 130/60 (!) 112/100  Pulse: 91 89 91 82  Resp: '19 16 20 18  '$ Temp: 98 F (36.7 C) 98.2 F (36.8 C) 98.5 F (36.9 C) 97.7 F (36.5 C)  TempSrc: Oral Oral Oral Oral  SpO2: 98% 96% 92% 100%  Weight:      Height:        Intake/Output Summary (Last 24 hours) at 07/03/2022 1158 Last data filed at 07/03/2022 1032 Gross per 24 hour  Intake 633.15 ml  Output --  Net 633.15 ml    Filed Weights   06/30/22 0013 07/02/22 0500  Weight: 83.9 kg 84 kg    Examination:  General exam: NAD. Respiratory system: Lungs clear to auscultation bilaterally.  No wheezes, no crackles, no rhonchi.  Fair air movement.  Speaking in full sentences.  No use of accessory muscles of respiration.  Cardiovascular system: Regular rate rhythm no murmurs rubs or gallops.  No JVD.  No lower extremity edema.  Gastrointestinal system: Abdomen is soft, nontender, nondistended, positive bowel sounds.  No rebound.  No guarding.   Central nervous system: Alert and oriented. No focal neurological deficits. Extremities: Symmetric 5 x 5 power. Skin: No rashes, lesions or ulcers Psychiatry: Judgement and insight appear normal. Mood & affect appropriate.     Data Reviewed: I have personally reviewed following labs and imaging studies  CBC: Recent Labs  Lab 06/30/22 0021 06/30/22 0104 07/01/22 0451 07/02/22 0436 07/03/22 0423  WBC 12.3*  --  11.3* 10.6* 8.9  NEUTROABS 8.1*  --   --   --   --   HGB 15.3* 15.0 13.6 12.5 11.7*  HCT 47.2* 44.0 41.2 39.4 37.2  MCV 92.0  --  92.2 95.2 94.4  PLT 220  --  211 195 175     Basic Metabolic Panel: Recent Labs  Lab 06/30/22 0021 06/30/22 0104 07/01/22 0451 07/02/22 0436 07/03/22 0423  NA 138 138 134* 134* 135  K 3.9 4.2 4.0 3.8 4.2  CL 103 105 104 103 105  CO2 17*  --  20* 22 23  GLUCOSE 155* 178* 139* 124* 123*  BUN '15 18 22 '$ 26* 28*  CREATININE 1.10* 0.90 1.30*  1.22* 1.22*  CALCIUM 9.6  --  8.7* 8.5* 8.8*  MG  --   --   --   --  2.1     GFR: Estimated Creatinine Clearance: 37.7 mL/min (A) (by C-G formula based on SCr of 1.22 mg/dL (H)).  Liver Function Tests: Recent Labs  Lab 06/30/22 0021 07/01/22 0451 07/02/22 0436 07/03/22 0423  AST 34 51* 45* 44*  ALT 24 47* 53* 57*  ALKPHOS 44 40 36* 32*  BILITOT 0.5 1.0 0.6 0.6  PROT 7.5 7.2 6.4* 6.3*  ALBUMIN 4.1 3.6 3.2* 3.3*     CBG: No results for input(s): "GLUCAP" in the last 168 hours.   Recent Results (from the past 240 hour(s))  Resp panel by RT-PCR (RSV, Flu A&B, Covid) Anterior Nasal Swab     Status: None   Collection Time: 06/30/22 12:48 AM   Specimen: Anterior Nasal Swab  Result Value Ref Range Status   SARS Coronavirus 2 by RT PCR NEGATIVE NEGATIVE Final    Comment: (NOTE) SARS-CoV-2 target nucleic acids are NOT DETECTED.  The SARS-CoV-2 RNA is generally detectable in upper respiratory specimens during the acute phase of infection. The lowest concentration of SARS-CoV-2 viral copies this assay can detect is 138 copies/mL. A negative result does not preclude SARS-Cov-2 infection and should not be used as the sole basis for treatment or other patient management decisions. A negative result may occur with  improper specimen collection/handling, submission of specimen other than nasopharyngeal swab, presence of viral mutation(s) within the areas targeted by this assay, and inadequate number of viral copies(<138 copies/mL). A negative result must be combined with clinical observations, patient history, and epidemiological information. The expected result is Negative.  Fact Sheet for Patients:  EntrepreneurPulse.com.au  Fact Sheet for Healthcare Providers:  IncredibleEmployment.be  This test is no t yet approved or cleared by the [place] FDA and  has been authorized for detection and/or diagnosis of SARS-CoV-2 by FDA under an  Emergency Use Authorization (EUA). This EUA will remain  in effect (meaning this test can be used) for the duration of the COVID-19 declaration under Section 564(b)(1) of the Act, 21 U.S.C.section 360bbb-3(b)(1), unless the authorization is terminated  or revoked sooner.       Influenza A by PCR NEGATIVE NEGATIVE Final   Influenza B by PCR NEGATIVE NEGATIVE Final    Comment: (NOTE) The Xpert Xpress SARS-CoV-2/FLU/RSV plus assay is intended as an aid in the diagnosis of influenza from Nasopharyngeal swab specimens and should not be used as a sole basis for treatment. Nasal washings and aspirates are unacceptable for Xpert Xpress SARS-CoV-2/FLU/RSV testing.  Fact Sheet for Patients: EntrepreneurPulse.com.au  Fact Sheet for Healthcare Providers: IncredibleEmployment.be  This test is not yet approved or cleared by the [place] FDA and has been authorized for detection and/or diagnosis of SARS-CoV-2 by FDA under an Emergency Use Authorization (EUA). This EUA will remain in effect (meaning this test can be used) for the duration of the COVID-19 declaration under Section 564(b)(1) of the Act, 21 U.S.C. section 360bbb-3(b)(1), unless the authorization is terminated or revoked.     Resp Syncytial Virus by PCR NEGATIVE NEGATIVE Final    Comment: (NOTE) Fact Sheet for Patients: EntrepreneurPulse.com.au  Fact Sheet for Healthcare Providers: IncredibleEmployment.be  This test is not yet approved or cleared by the [place] FDA and has been authorized for detection and/or diagnosis of SARS-CoV-2 by FDA under an Emergency Use Authorization (EUA). This EUA will remain in effect (meaning this test can be used) for the duration of the COVID-19 declaration under Section 564(b)(1) of the Act, 21 U.S.C. section 360bbb-3(b)(1), unless the authorization is terminated or revoked.  Performed at [facility], [place] [address], [place], [place] [address]   MRSA Next Gen by PCR, Nasal     Status: None   Collection Time: 07/01/22  2:45 PM   Specimen: Nasal Mucosa; Nasal Swab  Result Value Ref Range Status   MRSA by PCR Next Gen NOT DETECTED NOT DETECTED Final    Comment: (NOTE) The GeneXpert MRSA Assay (FDA approved for NASAL specimens only), is one component of a comprehensive MRSA colonization surveillance program. It is not intended to diagnose MRSA infection nor to guide or monitor treatment for MRSA infections. Test performance is not FDA approved in patients less than 44 years old. Performed at [facility], [place] [address] [place], [place] [address]          Radiology Studies: No results found.      Scheduled Meds:  apixaban  10 mg Oral  BID   Followed by   Derrill Memo ON 07/10/2022] apixaban  5 mg Oral BID   donepezil  10 mg Oral QHS   memantine  10 mg Oral BID   pantoprazole  40 mg Oral Daily   sodium chloride flush  3 mL Intravenous Q12H   venlafaxine XR  150 mg Oral Daily   Continuous Infusions:     LOS: 3 days    Time spent: 40 mins    Irine Seal, MD [facility]   To contact the attending provider between 7A-7P or the covering provider during after hours 7P-7A, please log into the web site [email or web address] and access using universal [place] password for that web site. If you do not have the password, please call the hospital operator.  07/03/2022, 11:58 AM

## 2022-07-03 NOTE — TOC Progression Note (Addendum)
Transition of Care [facility]) - Progression Note    Patient Details  Name: Brenda Stephens MRN: [id number] Date of Birth: Nov 12, 1940  Transition of Care [facility]) CM/SW [place], LCSW Phone Number: 07/03/2022, 12:22 PM  Clinical Narrative:    CSW received consult in regards to medication assistance, Pt has insurance and does not qualify for [facility] program.  CSW spoke with pt and informed her that the hospital is unable to help pay for her medications. CSW spoke with pt about HH recommendations , pt is agreeable. Pt has no preferences in agency. CSW to arrange HHPT/OT.  ADDEN 1:40pm  [facility] accepted pt for HHPT/OT will need HH orders. MD made aware.  Expected Discharge Plan:  (TBA) Barriers to Discharge: Continued Medical Work up  Expected Discharge Plan and Services In-house Referral: PCP / [facility] Connect     Living arrangements for the past 2 months: Single Family Home                                       Social Determinants of Health (SDOH) Interventions [place]: No Food Insecurity (07/01/2022)  Housing: Low Risk  (07/01/2022)  Transportation Needs: No Transportation Needs (07/01/2022)  Utilities: Not At Risk (07/01/2022)  Tobacco Use: Medium Risk (06/30/2022)    Readmission Risk Interventions     No data to display

## 2022-07-03 NOTE — Care Management Important Message (Signed)
Important Message  Patient Details IM Letter given. Name: Brenda Stephens MRN: [id number] Date of Birth: 1941/04/30   Medicare Important Message Given:  Yes     Kerin Salen 07/03/2022, 2:35 PM

## 2022-07-03 NOTE — Progress Notes (Addendum)
ANTICOAGULATION CONSULT NOTE - Follow Up Consult  Pharmacy Consult for Eliquis Indication:  DVT and PE  Allergies  Allergen Reactions   Latex Rash   Sulfa Antibiotics Anaphylaxis and Nausea Only   Sulfamethoxazole-Trimethoprim Anaphylaxis and Nausea Only   Aspirin Other (See Comments)    GI bleeding   Nsaids Other (See Comments)    GI upset    Oxycodone-Acetaminophen Nausea Only    Patient Measurements: Height: '5\' 3"'$  (160 cm) Weight: 84 kg (185 lb 3 oz) IBW/kg (Calculated) : 52.4 Heparin Dosing Weight: 71 kg  Vital Signs: Temp: 97.7 F (36.5 C) (01/04 0610) Temp Source: Oral (01/04 0610) BP: 112/100 (01/04 0610) Pulse Rate: 82 (01/04 0610)  Labs: Recent Labs    06/30/22 1357 06/30/22 1605 06/30/22 1852 07/01/22 0451 07/01/22 0452 07/02/22 0436 07/03/22 0423  HGB  --   --    < > 13.6  --  12.5 11.7*  HCT  --   --   --  41.2  --  39.4 37.2  PLT  --   --   --  211  --  195 175  HEPARINUNFRC  --   --    < >  --  0.70 0.65 0.60  CREATININE  --   --   --  1.30*  --  1.22* 1.22*  TROPONINIHS 122* 109*  --   --   --   --   --    < > = values in this interval not displayed.     Estimated Creatinine Clearance: 37.7 mL/min (A) (by C-G formula based on SCr of 1.22 mg/dL (H)).   Assessment: 82 yo female with breast cancer admitted with acute submassive bilateral PE with RV strain. Dopplers also with R popliteal DVT.  Pharmacy consulted to dose IV heparin.   Today, 07/03/2022 Heparin level 0.60, remains therapeutic on heparin 1200 units/hr CBC okay No bleeding or infusion issues noted Tamoxifen discontinued 1/2 due to acute PE - to f/u w/ Onc as outpatient  Goal of Therapy:  Heparin level 0.3-0.7 units/ml Monitor platelets by anticoagulation protocol: Yes   Plan:  -Continue IV heparin 1200 units/hr -Daily HL and CBC -Monitor for signs/symptoms of bleeding -F/u transition to oral anticoagulation  Addendum:  Pharmacy consulted to transition to Eliquis. -Stop  heparin infusion with first dose of Eliquis -Start Eliquis 10 mg BID x 7 days, followed by 5 mg BID -Pharmacy to counsel patient (+/- family) prior to discharge  Tawnya Crook, PharmD, BCPS Clinical Pharmacist 07/03/2022 7:13 AM

## 2022-07-03 NOTE — Evaluation (Signed)
Physical Therapy Evaluation Patient Details Name: Brenda Stephens MRN: [id number] DOB: Nov 13, 1940 Today's Date: 07/03/2022  History of Present Illness  82 year old female with past medical history of right breast cancer on tamoxifen, hyperlipidemia and GERD admitted with gradually worsening shortness of breath.  She was found to have bilateral acute PE near occlusive clots in both distal main pulmonary arteries with extension into the bilateral lobar main arteries and segmental arteries and a few downstream subsegmental arterial branches with overall large clot burden.  Associated with RV strain.  Venous Doppler shows right lower extremity DVT.  Clinical Impression  Pt admitted with above diagnosis.  Pt currently with functional limitations due to the deficits listed below (see PT Problem List). Pt will benefit from skilled PT to increase their independence and safety with mobility to allow discharge to the venue listed below.   Pt assisted with ambulating short distance in hallway and SPO2 97% on room air.  Pt utilized RW for a little more support and for endurance; she reports she has RW at home.  [name] lives with her, but pt mostly independent at baseline.        Recommendations for follow up therapy are one component of a multi-disciplinary discharge planning process, led by the attending physician.  Recommendations may be updated based on patient status, additional functional criteria and insurance authorization.  Follow Up Recommendations Home health PT      Assistance Recommended at Discharge PRN  Patient can return home with the following  Assistance with cooking/housework;Help with stairs or ramp for entrance    Equipment Recommendations None recommended by PT  Recommendations for Other Services       Functional Status Assessment Patient has had a recent decline in their functional status and demonstrates the ability to make significant improvements in function in a  reasonable and predictable amount of time.     Precautions / Restrictions Precautions Precautions: Fall      Mobility  Bed Mobility               General bed mobility comments: pt in recliner on arrival    Transfers Overall transfer level: Needs assistance Equipment used: Rolling walker (2 wheels) Transfers: Sit to/from Stand Sit to Stand: Supervision                Ambulation/Gait Ambulation/Gait assistance: Min guard, Supervision Gait Distance (Feet): 120 Feet Assistive device: Rolling walker (2 wheels) Gait Pattern/deviations: Step-through pattern, Decreased stride length Gait velocity: decr     General Gait Details: cues for use of RW, mild increased work of breathing upon returning to room however does not c/o dyspnea, SpO2 97% on room air during ambulation  Stairs            Wheelchair Mobility    Modified Rankin (Stroke Patients Only)       Balance Overall balance assessment: History of Falls                                           Pertinent Vitals/Pain Pain Assessment Pain Assessment: No/denies pain    Home Living Family/patient expects to be discharged to:: Private residence Living Arrangements: Children (daughter) Available Help at Discharge: Family Type of Home: House         Home Layout: Bed/bath upstairs;Two level Home Equipment: Conservation officer, nature (2 wheels)      Prior Function Prior Level  of Function : Independent/Modified Independent                     Hand Dominance        Extremity/Trunk Assessment        Lower Extremity Assessment Lower Extremity Assessment: Generalized weakness       Communication   Communication: No difficulties  Cognition Arousal/Alertness: Awake/alert Behavior During Therapy: WFL for tasks assessed/performed Overall Cognitive Status: Within Functional Limits for tasks assessed                                          General  Comments      Exercises     Assessment/Plan    PT Assessment Patient needs continued PT services  PT Problem List Decreased activity tolerance;Decreased strength;Decreased mobility;Decreased knowledge of use of DME       PT Treatment Interventions Gait training;DME instruction;Therapeutic exercise;Balance training;Functional mobility training;Therapeutic activities;Patient/family education;Stair training    PT Goals (Current goals can be found in the Care Plan section)  Acute Rehab PT Goals PT Goal Formulation: With patient Time For Goal Achievement: 07/17/22 Potential to Achieve Goals: Good    Frequency Min 3X/week     Co-evaluation               AM-PAC PT "6 Clicks" Mobility  Outcome Measure Help needed turning from your back to your side while in a flat bed without using bedrails?: A Little Help needed moving from lying on your back to sitting on the side of a flat bed without using bedrails?: A Little Help needed moving to and from a bed to a chair (including a wheelchair)?: A Little Help needed standing up from a chair using your arms (e.g., wheelchair or bedside chair)?: A Little Help needed to walk in hospital room?: A Little Help needed climbing 3-5 steps with a railing? : A Little 6 Click Score: 18    End of Session Equipment Utilized During Treatment: Gait belt Activity Tolerance: Patient tolerated treatment well Patient left: in chair;with call bell/phone within reach;with family/visitor present Nurse Communication: Mobility status PT Visit Diagnosis: Difficulty in walking, not elsewhere classified (R26.2)    Time: 8315-1761 PT Time Calculation (min) (ACUTE ONLY): 16 min   Charges:   PT Evaluation $PT Eval Low Complexity: 1 Low         Kati PT, DPT Physical Therapist Acute Rehabilitation Services Preferred contact method: Secure Chat Weekend Pager Only: [phone] Office: [phone]   Myrtis Hopping [place] 07/03/2022, 11:31 AM

## 2022-07-04 DIAGNOSIS — I2699 Other pulmonary embolism without acute cor pulmonale: Secondary | ICD-10-CM

## 2022-07-04 DIAGNOSIS — F419 Anxiety disorder, unspecified: Secondary | ICD-10-CM | POA: Diagnosis not present

## 2022-07-04 DIAGNOSIS — I824Y1 Acute embolism and thrombosis of unspecified deep veins of right proximal lower extremity: Secondary | ICD-10-CM | POA: Diagnosis not present

## 2022-07-04 DIAGNOSIS — F0390 Unspecified dementia without behavioral disturbance: Secondary | ICD-10-CM | POA: Diagnosis not present

## 2022-07-04 LAB — CBC
HCT: 36 % (ref 36.0–46.0)
Hemoglobin: 11.5 g/dL — ABNORMAL LOW (ref 12.0–15.0)
MCH: 30.2 pg (ref 26.0–34.0)
MCHC: 31.9 g/dL (ref 30.0–36.0)
MCV: 94.5 fL (ref 80.0–100.0)
Platelets: 190 10*3/uL (ref 150–400)
RBC: 3.81 MIL/uL — ABNORMAL LOW (ref 3.87–5.11)
RDW: 12 % (ref 11.5–15.5)
WBC: 8 10*3/uL (ref 4.0–10.5)
nRBC: 0 % (ref 0.0–0.2)

## 2022-07-04 LAB — BASIC METABOLIC PANEL
Anion gap: 7 (ref 5–15)
BUN: 25 mg/dL — ABNORMAL HIGH (ref 8–23)
CO2: 24 mmol/L (ref 22–32)
Calcium: 8.7 mg/dL — ABNORMAL LOW (ref 8.9–10.3)
Chloride: 106 mmol/L (ref 98–111)
Creatinine, Ser: 1.16 mg/dL — ABNORMAL HIGH (ref 0.44–1.00)
GFR, Estimated: 48 mL/min — ABNORMAL LOW (ref >60)
Glucose, Bld: 123 mg/dL — ABNORMAL HIGH (ref 70–99)
Potassium: 4 mmol/L (ref 3.5–5.1)
Sodium: 137 mmol/L (ref 135–145)

## 2022-07-04 LAB — MAGNESIUM: Magnesium: 2.2 mg/dL (ref 1.7–2.4)

## 2022-07-04 NOTE — Progress Notes (Signed)
PROGRESS NOTE    Brenda Stephens  URK:[id number] DOB: 20-Sep-1940 DOA: 06/30/2022 PCP: Jola Baptist, PA-C    Chief Complaint  Patient presents with   Shortness of Breath    Brief Narrative:  82 year old female lives at home alone, has a daughter nearby with history of right breast cancer on tamoxifen, hyperlipidemia and GERD admitted with gradually worsening shortness of breath.  She was found to have bilateral acute PE near occlusive clots in both distal main pulmonary arteries with extension into the bilateral lobar main arteries and segmental arteries and a few downstream subsegmental arterial branches with overall large clot burden.  Associated with RV strain.  Venous Doppler shows right lower extremity DVT.  She denies recent travel or surgery.   Echocardiogram-ejection fraction 60 to 65%.   Grade 1 diastolic dysfunction. Her husband passed away not too long ago. She is now 95% on 2 L.   Assessment & Plan:   Principal Problem:   Bilateral pulmonary embolism (HCC) Active Problems:   Pulmonary embolism (HCC)   Acute deep vein thrombosis (DVT) of proximal vein of right lower extremity (HCC)   Elevated troponin   Anxiety and depression   Dementia without behavioral disturbance (HCC)   HX: breast cancer  #1 acute submassive bilateral PE/right lower extremity DVT -Patient presented with worsening shortness of breath, CT angiogram chest done concerning for bilateral acute PE with near occlusive clots in both distal main pulmonary arteries with extension into the bilateral lower main arteries and segmental arteries with few downstream subsegmental arterial branches with overall large clot burden with associated right ventricular strain. -2D echo done positive for McConnell sign, EF of 60 to 83%, grade 1 diastolic dysfunction. -Lower extremity Dopplers done positive for DVT. -.  Disposing factors include history of breast cancer, on tamoxifen, sedentary life recently. -Patient  improving clinically, currently on room air, states shortness of breath has improved.   -Patient status post heparin x 72 hours, transition to Eliquis on 07/03/2022 which she seems to be tolerating.   -Supportive care.   2.  Elevated troponins -Likely secondary to problem #1. -EKG with no acute changes. -Cardiac cath done in 2021 with normal coronaries. -2D echo with a EF of 60 to 65%, moderate LVH,NWMA, grade 1 diastolic dysfunction, moderately reduced right ventricular systolic function, free wall hypokinesis with normal apical systolic function consistent with McConnell sign. -Continue treatment as in problem #1.  3.  History of breast cancer on tamoxifen -Tamoxifen on hold. -Patient is supposed to finish her 5 years of tamoxifen in October and followed up with oncology in October. -Prior hospitalist/MD, Dr. Zigmund  discussed with Dr. Benay Spice who was in agreement with continuation of anticoagulation, holding tamoxifen with outpatient follow-up with oncology.  4.  Dementia -Continue Aricept/Namenda.  5.  GERD -PPI.  6.  Hyperlipidemia -Not on statin. -Outpatient follow-up with PCP.  7.  Depression/anxiety -Continue home regimen Effexor.    DVT prophylaxis: Heparin>>>> Eliquis Code Status: Full Family Communication: Updated patient.  No family at bedside. Disposition: Likely home when clinically improved hopefully in the next 24 hours..  Status is: Inpatient Remains inpatient appropriate because: Severity of illness   Consultants:  None  Procedures:  Ct angio chest 06/30/2022 Abdominal films 06/30/2022 Chest x-ray 06/30/2022 2D echo 06/30/2022 Lower extremity Dopplers 06/30/2022  Antimicrobials:  Anti-infectives (From admission, onward)    None         Subjective: Sitting up in recliner.  States does not feel too well today, feels fatigued.  Shortness of breath improving daily.  Denies any chest pain.  No abdominal pain.  Daughter at bedside.   Objective: Vitals:    07/03/22 1211 07/03/22 2005 07/04/22 0441 07/04/22 1224  BP: 129/68 (!) 143/72 136/72 (!) 157/65  Pulse: 80 91 86 79  Resp: '18 18 16 20  '$ Temp: 98 F (36.7 C) 98 F (36.7 C) 97.8 F (36.6 C) 97.6 F (36.4 C)  TempSrc: Oral Oral Oral Oral  SpO2: 94% 99% 99% 99%  Weight:   85.8 kg   Height:       No intake or output data in the 24 hours ending 07/04/22 1839  Filed Weights   06/30/22 0013 07/02/22 0500 07/04/22 0441  Weight: 83.9 kg 84 kg 85.8 kg    Examination:  General exam: NAD. Respiratory system: CTAB.  No wheezes, no crackles, no rhonchi.  Fair air movement.  Speaking in full sentences. Cardiovascular system: RRR no murmurs rubs or gallops.  No JVD.  No lower extremity edema.  Gastrointestinal system: Abdomen is soft, nontender, nondistended, positive bowel sounds.  No rebound.  No guarding.  Central nervous system: Alert and oriented. No focal neurological deficits. Extremities: Symmetric 5 x 5 power. Skin: No rashes, lesions or ulcers Psychiatry: Judgement and insight appear normal. Mood & affect appropriate.     Data Reviewed: I have personally reviewed following labs and imaging studies  CBC: Recent Labs  Lab 06/30/22 0021 06/30/22 0104 07/01/22 0451 07/02/22 0436 07/03/22 0423 07/04/22 0408  WBC 12.3*  --  11.3* 10.6* 8.9 8.0  NEUTROABS 8.1*  --   --   --   --   --   HGB 15.3* 15.0 13.6 12.5 11.7* 11.5*  HCT 47.2* 44.0 41.2 39.4 37.2 36.0  MCV 92.0  --  92.2 95.2 94.4 94.5  PLT 220  --  211 195 175 190     Basic Metabolic Panel: Recent Labs  Lab 06/30/22 0021 06/30/22 0104 07/01/22 0451 07/02/22 0436 07/03/22 0423 07/04/22 0408  NA 138 138 134* 134* 135 137  K 3.9 4.2 4.0 3.8 4.2 4.0  CL 103 105 104 103 105 106  CO2 17*  --  20* '22 23 24  '$ GLUCOSE 155* 178* 139* 124* 123* 123*  BUN '15 18 22 '$ 26* 28* 25*  CREATININE 1.10* 0.90 1.30* 1.22* 1.22* 1.16*  CALCIUM 9.6  --  8.7* 8.5* 8.8* 8.7*  MG  --   --   --   --  2.1 2.2      GFR: Estimated Creatinine Clearance: 40.2 mL/min (A) (by C-G formula based on SCr of 1.16 mg/dL (H)).  Liver Function Tests: Recent Labs  Lab 06/30/22 0021 07/01/22 0451 07/02/22 0436 07/03/22 0423  AST 34 51* 45* 44*  ALT 24 47* 53* 57*  ALKPHOS 44 40 36* 32*  BILITOT 0.5 1.0 0.6 0.6  PROT 7.5 7.2 6.4* 6.3*  ALBUMIN 4.1 3.6 3.2* 3.3*     CBG: No results for input(s): "GLUCAP" in the last 168 hours.   Recent Results (from the past 240 hour(s))  Resp panel by RT-PCR (RSV, Flu A&B, Covid) Anterior Nasal Swab     Status: None   Collection Time: 06/30/22 12:48 AM   Specimen: Anterior Nasal Swab  Result Value Ref Range Status   SARS Coronavirus 2 by RT PCR NEGATIVE NEGATIVE Final    Comment: (NOTE) SARS-CoV-2 target nucleic acids are NOT DETECTED.  The SARS-CoV-2 RNA is generally detectable in upper respiratory specimens during the acute phase of  infection. The lowest concentration of SARS-CoV-2 viral copies this assay can detect is 138 copies/mL. A negative result does not preclude SARS-Cov-2 infection and should not be used as the sole basis for treatment or other patient management decisions. A negative result may occur with  improper specimen collection/handling, submission of specimen other than nasopharyngeal swab, presence of viral mutation(s) within the areas targeted by this assay, and inadequate number of viral copies(<138 copies/mL). A negative result must be combined with clinical observations, patient history, and epidemiological information. The expected result is Negative.  Fact Sheet for Patients:  EntrepreneurPulse.com.au  Fact Sheet for Healthcare Providers:  IncredibleEmployment.be  This test is no t yet approved or cleared by the [place] FDA and  has been authorized for detection and/or diagnosis of SARS-CoV-2 by FDA under an Emergency Use Authorization (EUA). This EUA will remain  in effect (meaning  this test can be used) for the duration of the COVID-19 declaration under Section 564(b)(1) of the Act, 21 U.S.C.section 360bbb-3(b)(1), unless the authorization is terminated  or revoked sooner.       Influenza A by PCR NEGATIVE NEGATIVE Final   Influenza B by PCR NEGATIVE NEGATIVE Final    Comment: (NOTE) The Xpert Xpress SARS-CoV-2/FLU/RSV plus assay is intended as an aid in the diagnosis of influenza from Nasopharyngeal swab specimens and should not be used as a sole basis for treatment. Nasal washings and aspirates are unacceptable for Xpert Xpress SARS-CoV-2/FLU/RSV testing.  Fact Sheet for Patients: EntrepreneurPulse.com.au  Fact Sheet for Healthcare Providers: IncredibleEmployment.be  This test is not yet approved or cleared by the [place] FDA and has been authorized for detection and/or diagnosis of SARS-CoV-2 by FDA under an Emergency Use Authorization (EUA). This EUA will remain in effect (meaning this test can be used) for the duration of the COVID-19 declaration under Section 564(b)(1) of the Act, 21 U.S.C. section 360bbb-3(b)(1), unless the authorization is terminated or revoked.     Resp Syncytial Virus by PCR NEGATIVE NEGATIVE Final    Comment: (NOTE) Fact Sheet for Patients: EntrepreneurPulse.com.au  Fact Sheet for Healthcare Providers: IncredibleEmployment.be  This test is not yet approved or cleared by the [place] FDA and has been authorized for detection and/or diagnosis of SARS-CoV-2 by FDA under an Emergency Use Authorization (EUA). This EUA will remain in effect (meaning this test can be used) for the duration of the COVID-19 declaration under Section 564(b)(1) of the Act, 21 U.S.C. section 360bbb-3(b)(1), unless the authorization is terminated or revoked.  Performed at [facility], [place] [address], [place], [place] [address]   MRSA Next Gen by  PCR, Nasal     Status: None   Collection Time: 07/01/22  2:45 PM   Specimen: Nasal Mucosa; Nasal Swab  Result Value Ref Range Status   MRSA by PCR Next Gen NOT DETECTED NOT DETECTED Final    Comment: (NOTE) The GeneXpert MRSA Assay (FDA approved for NASAL specimens only), is one component of a comprehensive MRSA colonization surveillance program. It is not intended to diagnose MRSA infection nor to guide or monitor treatment for MRSA infections. Test performance is not FDA approved in patients less than 52 years old. Performed at [facility], [place] [address] [place], [place] [address]          Radiology Studies: No results found.      Scheduled Meds:  apixaban  10 mg Oral BID   Followed by   Derrill Memo ON 07/10/2022] apixaban  5 mg Oral  BID   donepezil  10 mg Oral QHS   memantine  10 mg Oral BID   pantoprazole  40 mg Oral Daily   sodium chloride flush  3 mL Intravenous Q12H   venlafaxine XR  150 mg Oral Daily   Continuous Infusions:     LOS: 4 days    Time spent: 40 mins    Irine Seal, MD [facility]   To contact the attending provider between 7A-7P or the covering provider during after hours 7P-7A, please log into the web site [email or web address] and access using universal [place] password for that web site. If you do not have the password, please call the hospital operator.  07/04/2022, 6:39 PM

## 2022-07-04 NOTE — Progress Notes (Signed)
Mobility Specialist - Progress Note   07/04/22 1006  Mobility  Activity Ambulated with assistance in hallway  Level of Assistance Modified independent, requires aide device or extra time  Assistive Device Front wheel walker  Distance Ambulated (ft) 158 ft  Range of Motion/Exercises Active  Activity Response Tolerated well  Mobility Referral Yes  $Mobility charge 1 Mobility   Pt was found on recliner chair and agreeable to ambulate. Grew fatigued as session progressed and at EOS returned to recliner chair with necessities in reach.  Ferd Hibbs Mobility Specialist

## 2022-07-04 NOTE — TOC Progression Note (Signed)
Transition of Care [facility]) - Progression Note    Patient Details  Name: Brenda Stephens MRN: [id number] Date of Birth: 01/24/1941  [facility] of [facility]) CM/SW [place], LCSW Phone Number: 07/04/2022, 9:04 AM  Clinical Narrative:    TOC CSW sent referral to [facility]  for rolling walker. Thayer Jew to be delivered to room prior to pt's discharge. Orders are needed MD and RN made aware.  TOC to follow.   Expected Discharge Plan:  (TBA) Barriers to Discharge: Continued Medical Work up  Expected Discharge Plan and Services In-house Referral: PCP / Health Connect     Living arrangements for the past 2 months: Single Family Home                                       Social Determinants of Health (SDOH) Interventions [place]: No Food Insecurity (07/01/2022)  Housing: Low Risk  (07/01/2022)  Transportation Needs: No Transportation Needs (07/01/2022)  Utilities: Not At Risk (07/01/2022)  Tobacco Use: Medium Risk (06/30/2022)    Readmission Risk Interventions     No data to display

## 2022-07-04 NOTE — Discharge Instructions (Addendum)
Information on my medicine - ELIQUIS (apixaban)   Why was Eliquis prescribed for you? Eliquis was prescribed to treat blood clots that may have been found in the veins of your legs (deep vein thrombosis) or in your lungs (pulmonary embolism) and to reduce the risk of them occurring again.  What do You need to know about Eliquis ? The starting dose is 10 mg (two 5 mg tablets) taken TWICE daily for the FIRST SEVEN (7) DAYS, then on 07/10/22  the dose is reduced to ONE 5 mg tablet taken TWICE daily.  Eliquis may be taken with or without food.   Try to take the dose about the same time in the morning and in the evening. If you have difficulty swallowing the tablet whole please discuss with your pharmacist how to take the medication safely.  Take Eliquis exactly as prescribed and DO NOT stop taking Eliquis without talking to the doctor who prescribed the medication.  Stopping may increase your risk of developing a new blood clot.  Refill your prescription before you run out.  After discharge, you should have regular check-up appointments with your healthcare provider that is prescribing your Eliquis.    What do you do if you miss a dose? If a dose of ELIQUIS is not taken at the scheduled time, take it as soon as possible on the same day and twice-daily administration should be resumed. The dose should not be doubled to make up for a missed dose.  Important Safety Information A possible side effect of Eliquis is bleeding. You should call your healthcare provider right away if you experience any of the following: Bleeding from an injury or your nose that does not stop. Unusual colored urine (red or dark brown) or unusual colored stools (red or black). Unusual bruising for unknown reasons. A serious fall or if you hit your head (even if there is no bleeding).  Some medicines may interact with Eliquis and might increase your risk of bleeding or clotting while on Eliquis. To help avoid  this, consult your healthcare provider or pharmacist prior to using any new prescription or non-prescription medications, including herbals, vitamins, non-steroidal anti-inflammatory drugs (NSAIDs) and supplements.  This website has more information on Eliquis (apixaban): http://www.eliquis.com/eliquis/home

## 2022-07-05 DIAGNOSIS — F0390 Unspecified dementia without behavioral disturbance: Secondary | ICD-10-CM | POA: Diagnosis not present

## 2022-07-05 DIAGNOSIS — F419 Anxiety disorder, unspecified: Secondary | ICD-10-CM | POA: Diagnosis not present

## 2022-07-05 DIAGNOSIS — I2699 Other pulmonary embolism without acute cor pulmonale: Secondary | ICD-10-CM | POA: Diagnosis not present

## 2022-07-05 DIAGNOSIS — I824Y1 Acute embolism and thrombosis of unspecified deep veins of right proximal lower extremity: Secondary | ICD-10-CM | POA: Diagnosis not present

## 2022-07-05 LAB — CBC
HCT: 36.9 % (ref 36.0–46.0)
Hemoglobin: 11.9 g/dL — ABNORMAL LOW (ref 12.0–15.0)
MCH: 30.3 pg (ref 26.0–34.0)
MCHC: 32.2 g/dL (ref 30.0–36.0)
MCV: 93.9 fL (ref 80.0–100.0)
Platelets: 211 10*3/uL (ref 150–400)
RBC: 3.93 MIL/uL (ref 3.87–5.11)
RDW: 12.1 % (ref 11.5–15.5)
WBC: 7.5 10*3/uL (ref 4.0–10.5)
nRBC: 0 % (ref 0.0–0.2)

## 2022-07-05 MED ORDER — APIXABAN 5 MG PO TABS
ORAL_TABLET | ORAL | 0 refills | Status: DC
  Filled 2022-07-16: qty 60, 30d supply, fill #0

## 2022-07-05 MED ORDER — ACETAMINOPHEN ER 650 MG PO TBCR: 650.0000 mg | EXTENDED_RELEASE_TABLET | Freq: Three times a day (TID) | ORAL | Status: DC | PRN

## 2022-07-05 MED ORDER — HYDROXYZINE HCL 25 MG PO TABS: 25.0000 mg | ORAL_TABLET | Freq: Four times a day (QID) | ORAL | 0 refills | Status: DC | PRN

## 2022-07-05 MED ORDER — NITROGLYCERIN 0.4 MG SL SUBL: 0.4000 mg | SUBLINGUAL_TABLET | SUBLINGUAL | 2 refills | Status: DC | PRN

## 2022-07-05 NOTE — TOC Transition Note (Signed)
Transition of Care [facility]) - CM/SW Discharge Note   Patient Details  Name: Brenda Stephens MRN: [id number] Date of Birth: [birth year]  [facility]) CM/SW Contact:  Henrietta Dine, RN Phone Number: 07/05/2022, 2:39 PM   Clinical Narrative:    D/C orders received for pt; [place] previously arranged w/ [facility] and RW delivered to room; no TOC needs.   Final next level of care: Home w Home Health Services Barriers to Discharge: No Barriers Identified   Patient Goals and CMS Choice CMS Medicare.gov Compare Post Acute Care list provided to:: Patient Choice offered to / list presented to : Patient, Adult Children  Discharge Placement                         Discharge Plan and Services Additional resources added to the After Visit Summary for   In-house Referral: PCP / Health Connect                                   Social Determinants of Health (SDOH) Interventions SDOH Screenings   Food Insecurity: No Food Insecurity (07/01/2022)  Housing: Low Risk  (07/01/2022)  Transportation Needs: No Transportation Needs (07/01/2022)  Utilities: Not At Risk (07/01/2022)  Tobacco Use: Medium Risk (06/30/2022)     Readmission Risk Interventions     No data to display

## 2022-07-05 NOTE — Discharge Summary (Signed)
Physician Discharge Summary  ARRA CONNAUGHTON STM:[id number] DOB: [birth year] DOA: 06/30/2022  PCP: Jola Baptist, PA-C  Admit date: 06/30/2022 Discharge date: 07/05/2022  Time spent: 60 minutes  Recommendations for Outpatient Follow-up:  Follow-up with Dr.Iruku, hematology/oncology as scheduled 07/09/2022.  On follow-up further management of patient's history of breast cancer will need to be done as well as decision on whether patient needs continuation of tamoxifen.  Patient's bilateral PE will also need to be reassessed to determine duration of treatment.  Patient will likely need refills on Eliquis after starter pack has been completed. Follow-up with Jola Baptist, PA-C in 2 weeks.  On follow-up patient will need a basic metabolic profile, magnesium level, CBC done to follow-up on electrolytes, renal function, counts.  Patient's PE will need to be followed up upon. Follow-up with Dr. Baird Lyons, pulmonary as previously scheduled on 08/25/2022.   Discharge Diagnoses:  Principal Problem:   Bilateral pulmonary embolism (HCC) Active Problems:   Pulmonary embolism (HCC)   Acute deep vein thrombosis (DVT) of proximal vein of right lower extremity (HCC)   Elevated troponin   Anxiety and depression   Dementia without behavioral disturbance (HCC)   HX: breast cancer   Discharge Condition: Stable and improved.  Diet recommendation: Regular  Filed Weights   07/02/22 0500 07/04/22 0441 07/05/22 0500  Weight: 84 kg 85.8 kg 85.4 kg    History of present illness:  HPI per Dr. Charleen Kirks is a 82 y.o. female with medical history significant of arthritis, right breast cancer currently on tamoxifen, GERD, dyslipidemia who presented to the ER with shortness of breath x 1 week.  Of breath worse with walking.  Patient presented to the ER because the shortness of breath had worsened to the point she could not make it from the bathroom to her chair.  She has been having ongoing ankle  swelling.  She has not had any trips via vehicle or airplane travel.  She has been having some issues with nausea and diffuse abdominal pain and because of this she has been more sedentary and has had poor oral intake.   Presentation she was afebrile tachycardic and somewhat tachypneic, hypertensive with room air saturations 95%.  ET angiography of the chest revealed acute PE bilateral near occlusive clots in both distal main pulmonary arteries with extension into the bilateral lobar main arteries, segmental arteries and a few downstream subsegmental arterial branches with overall large clot burden.  This was associated with RV strain.  There was also an incidental hepatic steatosis.  Request was made for admission to the hospitalist service.   Patient denies any personal or known history of coagulopathy disorders.  She once again denies any history of recent car travel, plane travel but again states she has been more sedentary due to some nonspecific GI symptoms as well as poor oral intake.  She also has a history of breast cancer and was due for follow-up this week.  Hospital Course:  #1 acute submassive bilateral PE/right lower extremity DVT -Patient presented with worsening shortness of breath, CT angiogram chest done concerning for bilateral acute PE with near occlusive clots in both distal main pulmonary arteries with extension into the bilateral lower main arteries and segmental arteries with few downstream subsegmental arterial branches with overall large clot burden with associated right ventricular strain. -2D echo done positive for McConnell sign, EF of 60 to 89%, grade 1 diastolic dysfunction. -Lower extremity Dopplers done positive for DVT. - Predisposing factors include  history of breast cancer, on tamoxifen, sedentary life recently. -Patient placed on IV heparin during the hospitalization, patient improved clinically, oxygen requirements improved and patient was on room air by day of  discharge.  Ambulatory sats noted patient maintaining sats above 92% on room air with ambulation.   -Patient maintained on IV heparin x 72 hours and subsequently transition to Eliquis on 07/03/2022 which patient tolerated and will be discharged home on.   -Outpatient follow-up with PCP and hematology/oncology.    2.  Elevated troponins -Likely secondary to problem #1. -EKG with no acute changes. -Cardiac cath done in 2021 with normal coronaries. -2D echo with a EF of 60 to 65%, moderate LVH,NWMA, grade 1 diastolic dysfunction, moderately reduced right ventricular systolic function, free wall hypokinesis with normal apical systolic function consistent with McConnell sign. -Patient remained asymptomatic throughout the hospitalization but discharged in stable condition. -Outpatient follow-up with PCP.   3.  History of breast cancer on tamoxifen -Tamoxifen was held during the hospitalization and discontinued on discharge.  -Patient was supposed to finish her 5 years of tamoxifen in October and followed up with oncology in October. -Prior hospitalist/MD, Dr. Zigmund  discussed with Dr. Benay Spice who was in agreement with continuation of anticoagulation, holding tamoxifen with outpatient follow-up with oncology. -Patient will follow-up with Dr. Chryl Heck, oncology on 07/09/2022.   4.  Dementia -Patient maintained on home regimen Aricept/Namenda.   5.  GERD -Patient maintained on a PPI.   6.  Hyperlipidemia -Not on statin. -Outpatient follow-up with PCP.   7.  Depression/anxiety -Patient maintained on home regimen Effexor.   -Outpatient follow-up.     Procedures: Ct angio chest 06/30/2022 Abdominal films 06/30/2022 Chest x-ray 06/30/2022 2D echo 06/30/2022 Lower extremity Dopplers 06/30/2022  Consultations: None  Discharge Exam: Vitals:   07/04/22 2119 07/05/22 0506  BP: (!) 158/69 (!) 149/75  Pulse: 90 82  Resp: 18 20  Temp: 98.5 F (36.9 C) 97.8 F (36.6 C)  SpO2: 96% 98%    General:  NAD Cardiovascular: Regular rate rhythm no murmurs rubs or gallops.  No JVD.  No lower extremity edema. Respiratory: Clear to auscultation bilaterally.  No wheezes, no crackles, no rhonchi.  Fair air movement.  Speaking in full sentences.  Discharge Instructions   Discharge Instructions     Diet general   Complete by: As directed    Increase activity slowly   Complete by: As directed       Allergies as of 07/05/2022       Reactions   Latex Rash   Sulfa Antibiotics Anaphylaxis, Nausea Only   Sulfamethoxazole-trimethoprim Anaphylaxis, Nausea Only   Aspirin Other (See Comments)   GI bleeding   Nsaids Other (See Comments)   GI upset    Oxycodone-acetaminophen Nausea Only        Medication List     STOP taking these medications    atorvastatin 20 MG tablet Commonly known as: LIPITOR   fluticasone 50 MCG/ACT nasal spray Commonly known as: FLONASE   Synthroid 50 MCG tablet Generic drug: levothyroxine   tamoxifen 20 MG tablet Commonly known as: NOLVADEX       TAKE these medications    acetaminophen 650 MG CR tablet Commonly known as: TYLENOL Take 1 tablet (650 mg total) by mouth every 8 (eight) hours as needed for pain (headaches). What changed:  how much to take when to take this   apixaban 5 MG Tabs tablet Commonly known as: ELIQUIS Take 2 tablets (10 mg total) by mouth 2 (  two) times daily for 5 days, THEN 2 tablets (10 mg total) 2 (two) times daily. Start taking on: July 05, 2022   Biotin 10000 MCG Tabs Take 10,000 mcg by mouth daily.   cetirizine 10 MG tablet Commonly known as: ZYRTEC Take 10 mg by mouth at bedtime.   donepezil 10 MG tablet Commonly known as: ARICEPT Take 10 mg by mouth at bedtime.   esomeprazole 20 MG capsule Commonly known as: NEXIUM Take 40 mg by mouth daily.   hydrOXYzine 25 MG tablet Commonly known as: ATARAX Take 1 tablet (25 mg total) by mouth every 6 (six) hours as needed for anxiety.   meclizine 25 MG  tablet Commonly known as: ANTIVERT Take 1 tablet (25 mg total) by mouth 3 (three) times daily as needed for dizziness. What changed:  when to take this reasons to take this   memantine 10 MG tablet Commonly known as: NAMENDA Take 10 mg by mouth 2 (two) times daily.   nitroGLYCERIN 0.4 MG SL tablet Commonly known as: NITROSTAT Place 1 tablet (0.4 mg total) under the tongue every 5 (five) minutes as needed for chest pain.   oxybutynin 5 MG tablet Commonly known as: DITROPAN Take 5 mg by mouth 2 (two) times daily.   PATADAY OP Place 1 drop into both eyes daily.   QUEtiapine 50 MG tablet Commonly known as: SEROQUEL Take 50 mg by mouth at bedtime.   sennosides-docusate sodium 8.6-50 MG tablet Commonly known as: SENOKOT-S Take 1 tablet by mouth daily as needed for constipation.   SYSTANE OP Place 1 drop into both eyes in the morning and at bedtime.   tamsulosin 0.4 MG Caps capsule Commonly known as: FLOMAX Take 0.4 mg by mouth daily.   valsartan 160 MG tablet Commonly known as: DIOVAN Take 160 mg by mouth daily.   venlafaxine XR 150 MG 24 hr capsule Commonly known as: EFFEXOR-XR TAKE 1 CAPSULE (150 MG TOTAL) BY MOUTH DAILY.               Durable Medical Equipment  (From admission, onward)           Start     Ordered   07/04/22 0954  For home use only DME 4 wheeled rolling walker with seat  Once       Question:  Patient needs a walker to treat with the following condition  Answer:  Debility   07/04/22 0954           Allergies  Allergen Reactions   Latex Rash   Sulfa Antibiotics Anaphylaxis and Nausea Only   Sulfamethoxazole-Trimethoprim Anaphylaxis and Nausea Only   Aspirin Other (See Comments)    GI bleeding   Nsaids Other (See Comments)    GI upset    Oxycodone-Acetaminophen Nausea Only    Follow-up Information     Jola Baptist, PA-C. Schedule an appointment as soon as possible for a visit in 2 week(s).   Specialty: Physician  Assistant Contact information: [place] [address] [phone]         Deneise Lever, MD Follow up on 08/25/2022.   Specialty: Pulmonary Disease Why: Follow-up as scheduled. Contact information: [place] [address] [phone]         Benay Pike, MD Follow up on 07/09/2022.   Specialty: Hematology and Oncology Why: Follow-up as scheduled at 3:15 PM. Contact information: [place] [address] [phone]  The results of significant diagnostics from this hospitalization (including imaging, microbiology, ancillary and laboratory) are listed below for reference.    Significant Diagnostic Studies: VAS [place] LOWER EXTREMITY VENOUS (DVT) (ONLY [facility] & [facility])  Result Date: 07/01/2022  Lower Venous DVT Study Patient Name:  PARRIS SIGNER  Date of Exam:   06/30/2022 Medical Rec #: [id number]        Accession #:    [id number] Date of Birth: June 25, 1941        Patient Gender: F Patient Age:   64 years Exam Location:  [facility] Procedure:      VAS [place] LOWER EXTREMITY VENOUS (DVT) Referring Phys: Erin Hearing --------------------------------------------------------------------------------  Indications: Pulmonary embolism.  Limitations: Poor ultrasound/tissue interface. Comparison Study: No previous exams Performing Technologist: Jody Hill RVT, RDMS  Examination Guidelines: A complete evaluation includes B-mode imaging, spectral Doppler, color Doppler, and power Doppler as needed of all accessible portions of each vessel. Bilateral testing is considered an integral part of a complete examination. Limited examinations for reoccurring indications may be performed as noted. The reflux portion of the exam is performed with the patient in reverse Trendelenburg.  +---------+---------------+---------+-----------+----------+-------------------+ RIGHT     CompressibilityPhasicitySpontaneityPropertiesThrombus Aging      +---------+---------------+---------+-----------+----------+-------------------+ CFV      Full           Yes      Yes                                      +---------+---------------+---------+-----------+----------+-------------------+ SFJ      Full                                                             +---------+---------------+---------+-----------+----------+-------------------+ FV Prox  Full           Yes      Yes                                      +---------+---------------+---------+-----------+----------+-------------------+ FV Mid   Full           Yes      Yes                                      +---------+---------------+---------+-----------+----------+-------------------+ FV DistalFull           Yes      Yes                                      +---------+---------------+---------+-----------+----------+-------------------+ PFV      Full                                                             +---------+---------------+---------+-----------+----------+-------------------+ POP      None  No       No                   Acute               +---------+---------------+---------+-----------+----------+-------------------+ PTV      Full                                                             +---------+---------------+---------+-----------+----------+-------------------+ PERO                                                  Not well visualized +---------+---------------+---------+-----------+----------+-------------------+   +---------+---------------+---------+-----------+----------+-------------------+ LEFT     CompressibilityPhasicitySpontaneityPropertiesThrombus Aging      +---------+---------------+---------+-----------+----------+-------------------+ CFV      Full           Yes      Yes                                       +---------+---------------+---------+-----------+----------+-------------------+ SFJ      Full                                                             +---------+---------------+---------+-----------+----------+-------------------+ FV Prox  Full           Yes      Yes                                      +---------+---------------+---------+-----------+----------+-------------------+ FV Mid   Full           Yes      Yes                                      +---------+---------------+---------+-----------+----------+-------------------+ FV DistalFull           Yes      Yes                                      +---------+---------------+---------+-----------+----------+-------------------+ PFV      Full                                                             +---------+---------------+---------+-----------+----------+-------------------+ POP      Full                                                             +---------+---------------+---------+-----------+----------+-------------------+  PTV                                                   Not well visualized +---------+---------------+---------+-----------+----------+-------------------+ PERO     Full                                                             +---------+---------------+---------+-----------+----------+-------------------+     Summary: BILATERAL: -No evidence of popliteal cyst, bilaterally. RIGHT: - Findings consistent with acute deep vein thrombosis involving the right popliteal vein.  LEFT: - There is no evidence of deep vein thrombosis in the lower extremity.  *See table(s) above for measurements and observations. Electronically signed by Deitra Mayo MD on 07/01/2022 at 3:47:54 PM.    Final    US RENAL  Result Date: 07/01/2022 CLINICAL DATA:  82 year old female with elevated serum creatinine. EXAM: RENAL / URINARY TRACT ULTRASOUND COMPLETE COMPARISON:  09/11/2021 CT.  FINDINGS: Right Kidney: Renal measurements: 9.5 x 5.3 x 5.7 cm = volume: 149 mL. Echogenicity within normal limits. Cortical thinning is noted. There is no evidence of hydronephrosis or solid mass. Left Kidney: Renal measurements: 10.3 x 4.6 x 4.7 cm = volume: 116 mL. Echogenicity is within normal limits. Cortical thinning is noted. There is no evidence of hydronephrosis or solid renal mass. Bladder: Appears normal for degree of bladder distention. Other: None. IMPRESSION: 1. Bilateral renal cortical thinning. Renal echogenicity within normal limits. 2. No evidence of hydronephrosis or solid renal mass. Electronically Signed   By: Margarette Canada M.D.   On: 07/01/2022 10:07   ECHOCARDIOGRAM COMPLETE  Result Date: 06/30/2022    ECHOCARDIOGRAM REPORT   Patient Name:   Brenda Stephens Date of Exam: 06/30/2022 Medical Rec #:  [id number]       Height:       63.0 in Accession #:    [id number]      Weight:       185.0 lb Date of Birth:  1941/04/11       BSA:          1.871 m Patient Age:    65 years        BP:           166/91 mmHg Patient Gender: F               HR:           104 bpm. Exam Location:  Inpatient Procedure: 2D Echo, Cardiac Doppler and Color Doppler Indications:    Pulmonary Embolus I26.09  History:        Patient has no prior history of Echocardiogram examinations.                 Signs/Symptoms:Dyspnea, Chest Pain and Fatigue; Risk                 Factors:Dyslipidemia.  Sonographer:    Luane School RDCS Referring Phys: [id number] Samella Parr  Sonographer Comments: Technically difficult study due to poor echo windows. Image acquisition challenging due to respiratory motion and supine position. IMPRESSIONS  1. Left ventricular ejection fraction, by estimation, is 60 to 65%. The left ventricle has normal function.  The left ventricle has no regional wall motion abnormalities. There is moderate left ventricular hypertrophy. Left ventricular diastolic parameters are consistent with Grade I diastolic dysfunction  (impaired relaxation).  2. Right ventricular systolic function is moderately reduced. The right ventricular size is normal. There is normal pulmonary artery systolic pressure. Free wall hypokinesis with normal apical systolic function consistent with McConnell's sign as can be  seen in acute PE  3. The mitral valve is normal in structure. No evidence of mitral valve regurgitation. No evidence of mitral stenosis.  4. The aortic valve is tricuspid. Aortic valve regurgitation is not visualized. Aortic valve sclerosis is present, with no evidence of aortic valve stenosis. FINDINGS  Left Ventricle: Left ventricular ejection fraction, by estimation, is 60 to 65%. The left ventricle has normal function. The left ventricle has no regional wall motion abnormalities. The left ventricular internal cavity size was small. There is moderate  left ventricular hypertrophy. Left ventricular diastolic parameters are consistent with Grade I diastolic dysfunction (impaired relaxation). Right Ventricle: The right ventricular size is normal. Right vetricular wall thickness was not well visualized. Right ventricular systolic function is moderately reduced. There is normal pulmonary artery systolic pressure. The tricuspid regurgitant velocity is 2.78 m/s, and with an assumed right atrial pressure of 3 mmHg, the estimated right ventricular systolic pressure is 09.4 mmHg. Left Atrium: Left atrial size was normal in size. Right Atrium: Right atrial size was normal in size. Pericardium: Trivial pericardial effusion is present. Presence of epicardial fat layer. Mitral Valve: The mitral valve is normal in structure. No evidence of mitral valve regurgitation. No evidence of mitral valve stenosis. Tricuspid Valve: The tricuspid valve is normal in structure. Tricuspid valve regurgitation is trivial. Aortic Valve: The aortic valve is tricuspid. Aortic valve regurgitation is not visualized. Aortic valve sclerosis is present, with no evidence of  aortic valve stenosis. Pulmonic Valve: The pulmonic valve was not well visualized. Pulmonic valve regurgitation is trivial. Aorta: The aortic root and ascending aorta are structurally normal, with no evidence of dilitation. IAS/Shunts: The interatrial septum was not well visualized.  LEFT VENTRICLE PLAX 2D LVIDd:         3.40 cm   Diastology LVIDs:         2.70 cm   LV e' medial:    4.43 cm/s LV PW:         1.50 cm   LV E/e' medial:  9.7 LV IVS:        1.50 cm   LV e' lateral:   3.98 cm/s LVOT diam:     2.00 cm   LV E/e' lateral: 10.8 LV SV:         44 LV SV Index:   24 LVOT Area:     3.14 cm  RIGHT VENTRICLE            IVC RV S prime:     8.81 cm/s  IVC diam: 1.50 cm TAPSE (M-mode): 1.8 cm LEFT ATRIUM           Index        RIGHT ATRIUM           Index LA diam:      3.40 cm 1.82 cm/m   RA Area:     10.10 cm LA Vol (A4C): 45.3 ml 24.22 ml/m  RA Volume:   19.40 ml  10.37 ml/m  AORTIC VALVE             PULMONIC VALVE LVOT Vmax:   93.73  cm/s  PR End Diast Vel: 7.62 msec LVOT Vmean:  67.233 cm/s LVOT VTI:    0.141 m  AORTA Ao Root diam: 3.00 cm Ao Asc diam:  3.00 cm Ao Desc diam: 1.90 cm MITRAL VALVE               TRICUSPID VALVE MV Area (PHT): 4.57 cm    TR Peak grad:   30.9 mmHg MV Decel Time: 166 msec    TR Vmax:        278.00 cm/s MV E velocity: 42.80 cm/s MV A velocity: 78.00 cm/s  SHUNTS MV E/A ratio:  0.55        Systemic VTI:  0.14 m                            Systemic Diam: 2.00 cm Oswaldo Milian MD Electronically signed by Oswaldo Milian MD Signature Date/Time: 06/30/2022/1:49:26 PM    Final    DG Abd Portable 2 Views  Result Date: 06/30/2022 CLINICAL DATA:  82 year old female with history of abdominal distension and nausea. EXAM: PORTABLE ABDOMEN - 2 VIEW COMPARISON:  No priors. FINDINGS: Gas and stool are seen scattered throughout the colon extending to the level of the distal rectum. No pathologic distension of small bowel is noted. No gross evidence of pneumoperitoneum. Iodinated  contrast material filling the lumen of the urinary bladder. IMPRESSION: 1. Nonobstructive bowel gas pattern. 2. No pneumoperitoneum. Electronically Signed   By: Vinnie Langton M.D.   On: 06/30/2022 10:37   CT Angio Chest PE W and/or Wo Contrast  Result Date: 06/30/2022 CLINICAL DATA:  Shortness of breath for the past week, worsening today. EXAM: CT ANGIOGRAPHY CHEST WITH CONTRAST TECHNIQUE: Multidetector CT imaging of the chest was performed using the standard protocol during bolus administration of intravenous contrast. Multiplanar CT image reconstructions and MIPs were obtained to evaluate the vascular anatomy. RADIATION DOSE REDUCTION: This exam was performed according to the departmental dose-optimization program which includes automated exposure control, adjustment of the mA and/or kV according to patient size and/or use of iterative reconstruction technique. CONTRAST:  163m OMNIPAQUE IOHEXOL 350 MG/ML SOLN COMPARISON:  Portable chest today, chest radiograph 09/20/2021, CTA chest 02/25/2022. FINDINGS: Cardiovascular: There is mild cardiomegaly with a right chamber predominance and RV/LV ratio of 1.31 indicating right heart strain. The pulmonary trunk is 3 cm and both main pulmonary arteries approaching 3 cm indicating arterial hypertension. There are bilateral near-occlusive clots, almost certainly acute, in both distal main pulmonary arteries with extension into the bilateral lobar main arteries, segmental arteries and a few downstream subsegmental arterial branches with overall large clot burden. There is no pericardial effusion. There is scattered calcification in the left main and LAD coronary artery. There is aortic atherosclerosis and tortuosity without aneurysm or dissection. The great vessels are not well seen due to metal spray artifact from bilateral shoulder replacements but they are grossly patent at least proximally. Pulmonary veins are decompressed. Mediastinum/Nodes: No enlarged  mediastinal, hilar, or axillary lymph nodes. Thyroid gland, trachea, and esophagus demonstrate no significant findings. Surgical clips are again noted in the right axilla Lungs/Pleura: Difficult to evaluate due to abundant respiratory motion artifacts. There are trace pleural effusions. There is a calcified right lower lobe granuloma laterally. Scattered linear scar-like opacities both bases. No confluent dense infiltrate is seen. There is no appreciable noncalcified nodule. No pleural thickening or pneumothorax. Upper Abdomen: Hepatic steatosis. No acute abnormality accounting for respiratory motion. Moderate pancreatic atrophy.  Musculoskeletal: There is osteopenia, thoracic kyphosis and advanced degenerative change of the thoracic spine. 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 The ribcage is intact. Review of the MIP images confirms the above findings. IMPRESSION: 1. Positive for acute PE with CT evidence of right heart strain (RV/LV Ratio = 1.31 ) consistent with at least submassive (intermediate risk) PE. The presence of right heart strain has been associated with an increased risk of morbidity and mortality. Please refer to the "PE Focused" order set in EPIC. 2. Aortic and coronary artery atherosclerosis. 3. Trace pleural effusions. 4. Hepatic steatosis. 5. Results phoned to Dr. Leonette Monarch in the ED at 3:03 a.m., 06/30/2022, with verbal acknowledgement of the key findings. Aortic Atherosclerosis (ICD10-I70.0). Electronically Signed   By: Telford Nab M.D.   On: 06/30/2022 03:15   DG Chest Port 1 View  Result Date: 06/30/2022 CLINICAL DATA:  Shortness of breath x1 week. EXAM: PORTABLE CHEST 1 VIEW COMPARISON:  September 20, 2021 FINDINGS: The heart size and mediastinal contours are within normal limits. Low lung volumes are noted. Both lungs are clear. Radiopaque surgical clips are seen along the lateral aspect of the right chest wall. 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 Multilevel degenerative changes are noted throughout the thoracic spine. IMPRESSION: Low lung volumes without active cardiopulmonary disease. Electronically Signed   By: Virgina Norfolk M.D.   On: 06/30/2022 00:55    Microbiology: Recent Results (from the past 240 hour(s))  Resp panel by RT-PCR (RSV, Flu A&B, Covid) Anterior Nasal Swab     Status: None   Collection Time: 06/30/22 12:48 AM   Specimen: Anterior Nasal Swab  Result Value Ref Range Status   SARS Coronavirus 2 by RT PCR NEGATIVE NEGATIVE Final    Comment: (NOTE) SARS-CoV-2 target nucleic acids are NOT DETECTED.  The SARS-CoV-2 RNA is generally detectable in upper respiratory specimens during the acute phase of infection. The lowest concentration of SARS-CoV-2 viral copies this assay can detect is 138 copies/mL. A negative result does not preclude SARS-Cov-2 infection and should not be used as the sole basis for treatment or other patient management decisions. A negative result may occur with  improper specimen collection/handling, submission of specimen other than nasopharyngeal swab, presence of viral mutation(s) within the areas targeted by this assay, and inadequate number of viral copies(<138 copies/mL). A negative result must be combined with clinical observations, patient history, and epidemiological information. The expected result is Negative.  Fact Sheet for Patients:  EntrepreneurPulse.com.au  Fact Sheet for Healthcare Providers:  IncredibleEmployment.be  This test is no t yet approved or cleared by the [place] FDA and  has been authorized for detection and/or diagnosis of SARS-CoV-2 by FDA under an Emergency Use Authorization (EUA). This EUA will remain  in effect (meaning this test can be used) for the duration of the COVID-19 declaration under Section 564(b)(1) of the Act, 21 U.S.C.section 360bbb-3(b)(1), unless the authorization is terminated  or revoked  sooner.       Influenza A by PCR NEGATIVE NEGATIVE Final   Influenza B by PCR NEGATIVE NEGATIVE Final    Comment: (NOTE) The Xpert Xpress SARS-CoV-2/FLU/RSV plus assay is intended as an aid in the diagnosis of influenza from Nasopharyngeal swab specimens and should not be used as a sole basis for treatment. Nasal washings and aspirates are unacceptable for Xpert Xpress SARS-CoV-2/FLU/RSV testing.  Fact Sheet for Patients: EntrepreneurPulse.com.au  Fact Sheet for Healthcare Providers: IncredibleEmployment.be  This test is not yet approved or cleared  by the [place] and has been authorized for detection and/or diagnosis of SARS-CoV-2 by FDA under an Emergency Use Authorization (EUA). This EUA will remain in effect (meaning this test can be used) for the duration of the COVID-19 declaration under Section 564(b)(1) of the Act, 21 U.S.C. section 360bbb-3(b)(1), unless the authorization is terminated or revoked.     Resp Syncytial Virus by PCR NEGATIVE NEGATIVE Final    Comment: (NOTE) Fact Sheet for Patients: EntrepreneurPulse.com.au  Fact Sheet for Healthcare Providers: IncredibleEmployment.be  This test is not yet approved or cleared by the [place] FDA and has been authorized for detection and/or diagnosis of SARS-CoV-2 by FDA under an Emergency Use Authorization (EUA). This EUA will remain in effect (meaning this test can be used) for the duration of the COVID-19 declaration under Section 564(b)(1) of the Act, 21 U.S.C. section 360bbb-3(b)(1), unless the authorization is terminated or revoked.  Performed at [facility], [place] [address], [place], [place] [address]   MRSA Next Gen by PCR, Nasal     Status: None   Collection Time: 07/01/22  2:45 PM   Specimen: Nasal Mucosa; Nasal Swab  Result Value Ref Range Status   MRSA by PCR Next Gen NOT DETECTED NOT DETECTED Final     Comment: (NOTE) The GeneXpert MRSA Assay (FDA approved for NASAL specimens only), is one component of a comprehensive MRSA colonization surveillance program. It is not intended to diagnose MRSA infection nor to guide or monitor treatment for MRSA infections. Test performance is not FDA approved in patients less than 73 years old. Performed at [facility], [place] [address] [place], [place] [address]      Labs: Basic Metabolic Panel: Recent Labs  Lab 06/30/22 0021 06/30/22 0104 07/01/22 0451 07/02/22 0436 07/03/22 0423 07/04/22 0408  NA 138 138 134* 134* 135 137  K 3.9 4.2 4.0 3.8 4.2 4.0  CL 103 105 104 103 105 106  CO2 17*  --  20* '22 23 24  '$ GLUCOSE 155* 178* 139* 124* 123* 123*  BUN '15 18 22 '$ 26* 28* 25*  CREATININE 1.10* 0.90 1.30* 1.22* 1.22* 1.16*  CALCIUM 9.6  --  8.7* 8.5* 8.8* 8.7*  MG  --   --   --   --  2.1 2.2   Liver Function Tests: Recent Labs  Lab 06/30/22 0021 07/01/22 0451 07/02/22 0436 07/03/22 0423  AST 34 51* 45* 44*  ALT 24 47* 53* 57*  ALKPHOS 44 40 36* 32*  BILITOT 0.5 1.0 0.6 0.6  PROT 7.5 7.2 6.4* 6.3*  ALBUMIN 4.1 3.6 3.2* 3.3*   No results for input(s): "LIPASE", "AMYLASE" in the last 168 hours. No results for input(s): "AMMONIA" in the last 168 hours. CBC: Recent Labs  Lab 06/30/22 0021 06/30/22 0104 07/01/22 0451 07/02/22 0436 07/03/22 0423 07/04/22 0408 07/05/22 0434  WBC 12.3*  --  11.3* 10.6* 8.9 8.0 7.5  NEUTROABS 8.1*  --   --   --   --   --   --   HGB 15.3*   < > 13.6 12.5 11.7* 11.5* 11.9*  HCT 47.2*   < > 41.2 39.4 37.2 36.0 36.9  MCV 92.0  --  92.2 95.2 94.4 94.5 93.9  PLT 220  --  211 195 175 190 211   < > = values in this interval not displayed.   Cardiac Enzymes: No results for input(s): "CKTOTAL", "CKMB", "CKMBINDEX", "TROPONINI" in the last 168 hours. BNP: BNP (last 3  results) Recent Labs    06/30/22 0021  BNP 284.4*    ProBNP (last 3 results) Recent Labs    02/21/22 1021   PROBNP 35.0    CBG: No results for input(s): "GLUCAP" in the last 168 hours.     Signed:  Irine Seal MD.  [facility] 07/05/2022, 11:54 AM

## 2022-07-05 NOTE — Plan of Care (Signed)
  Problem: Clinical Measurements: Goal: Diagnostic test results will improve Outcome: Progressing Goal: Respiratory complications will improve Outcome: Progressing Goal: Cardiovascular complication will be avoided Outcome: Progressing   Problem: Activity: Goal: Risk for activity intolerance will decrease Outcome: Progressing   Problem: Nutrition: Goal: Adequate nutrition will be maintained Outcome: Progressing   

## 2022-07-09 ENCOUNTER — Inpatient Hospital Stay: Payer: MEDICARE | Admitting: Hematology and Oncology

## 2022-07-09 NOTE — Progress Notes (Deleted)
[place]  [phone] [phone]     ID: [name] DOB: [birth year]  MR#: [id number]  [id number]  Patient Care Team: [name] as PCP - General (Physician Assistant) [name], MD as PCP - Cardiology (Cardiology) [name], [name], MD (Inactive) as Consulting Physician (Oncology) [name], MD as Consulting Physician (General Surgery) [name], MD as Consulting Physician (Gastroenterology) OTHER MD:   CHIEF COMPLAINT: Estrogen receptor positive breast cancer  CURRENT TREATMENT: Tamoxifen   INTERVAL HISTORY: Brenda Stephens returns today for follow-up of her estrogen receptor positive breast cancer.  She was last seen by Dr. Jana Hakim and continued on tamoxifen.  Most recently she developed shortness of breath and had a CT PE protocol which showed acute PE with evidence of right heart strain consistent with a submassive PE. She is hence here for follow-up regarding management of tamoxifen as well as to discuss duration and role of anticoagulation.  REVIEW OF SYSTEMS:    COVID 19 VACCINATION STATUS: Status post [place] x2, most recently February 2020; no booster as of November 2021    HISTORY OF CURRENT ILLNESS: From the original intake note:  Brenda Stephens had routine bilateral screening mammography at [facility] 01/28/2017. An irregular lesion in the right breast upper outer quadrant was detected and she was recalled for right diagnostic mammography and ultrasonography 02/05/2017. This found the breast density to be category B. In the right breast upper quadrant there was an irregular mass which by ultrasound measured 0.8 cm. It was located at the 11:00 radiant posteriorly. The right axilla was sonographically benign.  Biopsy of the right breast mass in question 02/09/2017 found ([id number]) invasive ductal carcinoma, grade 1, estrogen receptor 100% positive, progesterone receptor 20% positive, both with strong staining  intensity, with an MIB-1 of 2% and no HER-2 amplification, the signals ratio being 1.19 and the number per cell 1.90.  The patient's subsequent history is as detailed below.   PAST MEDICAL HISTORY: Past Medical History:  Diagnosis Date   Allergy    latex   Anemia    Anxiety    Arthritis    Cancer ([place]) 01/2017   right breast cancer   Depression    Dyslipidemia    Eustachian tube dysfunction    GERD (gastroesophageal reflux disease)    Hx of cardiovascular stress test    Myoview 10/13:  apical thinning, EF 72%, no ischemia   Hx of colonic polyps    Dr Watt Climes   Hypertension    Hypothyroidism    Other abnormal glucose     PAST SURGICAL HISTORY: Past Surgical History:  Procedure Laterality Date   ABDOMINAL HYSTERECTOMY     BSO for uterine fibroids   ACHILLES TENDON SURGERY Right 10/07/2012   Procedure: RIGHT REPAIR RUPTURE TENDON PRIMARY OPEN/PERCUTANEOUS ;  Surgeon: Ninetta Lights, MD;  Location: [place];  Service: Orthopedics;  Laterality: Right;   APPENDECTOMY     BREAST LUMPECTOMY WITH RADIOACTIVE SEED AND SENTINEL LYMPH NODE BIOPSY Right 03/16/2017   Procedure: RIGHT BREAST LUMPECTOMY WITH RADIOACTIVE SEED AND RIGHT AXILLARY SENTINEL  NODE BIOPSY ERAS PATHWAY;  Surgeon: [name], MD;  Location: [place];  Service: General;  Laterality: Right;  PECTORAL BLOCK   CATARACT EXTRACTION, BILATERAL     CHOLECYSTECTOMY     COLONOSCOPY W/ POLYPECTOMY     Dr Watt Climes   EAR CYST EXCISION Right 10/07/2012   Procedure: EXCISION BONE CYST BENIGN TUMOR TALUS Diannia Ruder ;  Surgeon: Nestor Ramp  Percell Miller, MD;  Location: [place];  Service: Orthopedics;  Laterality: Right;   EXPLORATORY LAPAROTOMY     LEFT HEART CATH AND CORONARY ANGIOGRAPHY N/A 08/02/2019   Procedure: LEFT HEART CATH AND CORONARY ANGIOGRAPHY;  Surgeon: Martinique, Peter M, MD;  Location: [place] [facility];  Service: Cardiovascular;  Laterality: N/A;   REVISION TOTAL SHOULDER  TO REVERSE TOTAL SHOULDER Left 01/12/2019   Procedure: REVISION TOTAL SHOULDER TO REVERSE TOTAL SHOULDER;  Surgeon: Hiram Gash, MD;  Location: [facility];  Service: Orthopedics;  Laterality: Left;   SINUS SURGERY WITH INSTATRAK     TOTAL SHOULDER REPLACEMENT     Right shoulder   TOTAL SHOULDER REPLACEMENT  05/2010   Left   TUBAL LIGATION      FAMILY HISTORY Family History  Problem Relation Age of Onset   Hiatal hernia Mother    Heart attack Mother 67   Heart disease Father 51       CAD @ autopsy   Cancer Father        renal cancer   Heart attack Brother 8       fatal   Diabetes Neg Hx    Stroke Neg Hx   The patient's father died from kidney cancer at age 24. The patient's mother died from a heart attack at age 43. The patient had one brother, who died from a heart attack. She had no sisters. There is no history of breast or ovarian cancer in the family.   GYNECOLOGIC HISTORY:  No LMP recorded. Patient has had a hysterectomy. Menarche: age 82 First live birth: age 82 [place] P28.  Hysterectomy? Yes BSO? Yes HR: yes, for between 5 and 10 years.   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 Her husband was Mudlogger of [place] for railroad company. He retired then worked as Special educational needs teacher. He died January 26, 2020. Their daughter Brenda Stephens is an attorney but currently works as a Education officer, museum in [place]. The patient has no grandchildren. She is not a church attender    ADVANCED DIRECTIVES: The patient intends to name her daughter [name] as her healthcare power of attorney. The appropriate papers were given to her to complete and notarized at her discretion at the 05/29/2019 visit.   HEALTH MAINTENANCE: Social History   Tobacco Use   Smoking status: Former    Packs/day: 0.50    Years: 6.00    Total pack years: 3.00    Types: Cigarettes    Quit date: 06/30/1962    Years since quitting: 60.0   Smokeless tobacco: Never   Tobacco  comments:    Quit age 82  Vaping Use   Vaping Use: Never used  Substance Use Topics   Alcohol use: Yes    Alcohol/week: 3.0 standard drinks of alcohol    Types: 3 Glasses of wine per week    Comment: wine  : < 3 glasses weekly   Drug use: No     Colonoscopy:May got  PAP: Status post hysterectomy  Bone density: Due/[facility]   Allergies  Allergen Reactions   Latex Rash   Sulfa Antibiotics Anaphylaxis and Nausea Only   Sulfamethoxazole-Trimethoprim Anaphylaxis and Nausea Only   Aspirin Other (See Comments)    GI bleeding   Nsaids Other (See Comments)    GI upset    Oxycodone-Acetaminophen Nausea Only    Current Outpatient Medications  Medication Sig Dispense Refill   acetaminophen (TYLENOL) 650 MG CR tablet  Take 1 tablet (650 mg total) by mouth every 8 (eight) hours as needed for pain (headaches).     apixaban (ELIQUIS) 5 MG TABS tablet Take 2 tablets (10 mg total) by mouth 2 (two) times daily for 5 days, THEN 2 tablets (10 mg total) 2 (two) times daily. 260 tablet 0   Biotin 10000 MCG TABS Take 10,000 mcg by mouth daily.     cetirizine (ZYRTEC) 10 MG tablet Take 10 mg by mouth at bedtime.     donepezil (ARICEPT) 10 MG tablet Take 10 mg by mouth at bedtime.     esomeprazole (NEXIUM) 20 MG capsule Take 40 mg by mouth daily.     hydrOXYzine (ATARAX) 25 MG tablet Take 1 tablet (25 mg total) by mouth every 6 (six) hours as needed for anxiety. 20 tablet 0   meclizine (ANTIVERT) 25 MG tablet Take 1 tablet (25 mg total) by mouth 3 (three) times daily as needed for dizziness. (Patient taking differently: Take 25 mg by mouth as needed for dizziness or nausea.) 30 tablet 0   memantine (NAMENDA) 10 MG tablet Take 10 mg by mouth 2 (two) times daily.     nitroGLYCERIN (NITROSTAT) 0.4 MG SL tablet Place 1 tablet (0.4 mg total) under the tongue every 5 (five) minutes as needed for chest pain. 20 tablet 2   Olopatadine HCl (PATADAY OP) Place 1 drop into both eyes daily.     oxybutynin (DITROPAN) 5  MG tablet Take 5 mg by mouth 2 (two) times daily.     Polyethyl Glycol-Propyl Glycol (SYSTANE OP) Place 1 drop into both eyes in the morning and at bedtime.     QUEtiapine (SEROQUEL) 50 MG tablet Take 50 mg by mouth at bedtime.     sennosides-docusate sodium (SENOKOT-S) 8.6-50 MG tablet Take 1 tablet by mouth daily as needed for constipation.     tamsulosin (FLOMAX) 0.4 MG CAPS capsule Take 0.4 mg by mouth daily.     valsartan (DIOVAN) 160 MG tablet Take 160 mg by mouth daily.     venlafaxine XR (EFFEXOR-XR) 150 MG 24 hr capsule TAKE 1 CAPSULE (150 MG TOTAL) BY MOUTH DAILY. (Patient taking differently: Take 150 mg by mouth daily.) 90 capsule 2   No current facility-administered medications for this visit.    OBJECTIVE: White woman in no acute distress  There were no vitals filed for this visit.     There is no height or weight on file to calculate BMI.   Wt Readings from Last 3 Encounters:  07/05/22 188 lb 4.4 oz (85.4 kg)  02/28/22 185 lb 3.2 oz (84 kg)  02/21/22 185 lb 6.4 oz (84.1 kg)     ECOG FS:1 - Symptomatic but completely ambulatory  Sclerae unicteric, EOMs intact Wearing a mask No cervical or supraclavicular adenopathy Lungs no rales or rhonchi Heart regular rate and rhythm Abd soft, nontender, positive bowel sounds MSK mild kyphosis but no focal spinal tenderness, no upper extremity lymphedema Neuro: nonfocal, well oriented, appropriate affect Breasts: The right breast is unremarkable.  The left breast is status postlumpectomy and radiation with no evidence of local recurrence.  Both axillae are benign. Skin: There is a significant erythematous confluent rash in both inframammary areas   LAB RESULTS:  CMP     Component Value Date/Time   NA 137 07/04/2022 0408   NA 138 07/26/2019 1548   NA 132 (L) 03/13/2017 1252   K 4.0 07/04/2022 0408   K 4.3 03/13/2017 1252   CL  106 07/04/2022 0408   CO2 24 07/04/2022 0408   CO2 26 03/13/2017 1252   GLUCOSE 123 (H)  07/04/2022 0408   GLUCOSE 107 03/13/2017 1252   BUN 25 (H) 07/04/2022 0408   BUN 15 07/26/2019 1548   BUN 14.8 03/13/2017 1252   CREATININE 1.16 (H) 07/04/2022 0408   CREATININE 1.08 (H) 06/11/2021 1533   CREATININE 0.8 03/13/2017 1252   CALCIUM 8.7 (L) 07/04/2022 0408   CALCIUM 10.0 03/13/2017 1252   PROT 6.3 (L) 07/03/2022 0423   PROT 7.2 03/13/2017 1252   ALBUMIN 3.3 (L) 07/03/2022 0423   ALBUMIN 4.0 03/13/2017 1252   AST 44 (H) 07/03/2022 0423   AST 18 06/11/2021 1533   AST 18 03/13/2017 1252   ALT 57 (H) 07/03/2022 0423   ALT 14 06/11/2021 1533   ALT 18 03/13/2017 1252   ALKPHOS 32 (L) 07/03/2022 0423   ALKPHOS 71 03/13/2017 1252   BILITOT 0.6 07/03/2022 0423   BILITOT 0.5 06/11/2021 1533   BILITOT 0.54 03/13/2017 1252   GFRNONAA 48 (L) 07/04/2022 0408   GFRNONAA 52 (L) 06/11/2021 1533   GFRAA >60 09/10/2019 1029    Lab Results  Component Value Date   WBC 7.5 07/05/2022   NEUTROABS 8.1 (H) 06/30/2022   HGB 11.9 (L) 07/05/2022   HCT 36.9 07/05/2022   MCV 93.9 07/05/2022   PLT 211 07/05/2022      Chemistry      Component Value Date/Time   NA 137 07/04/2022 0408   NA 138 07/26/2019 1548   NA 132 (L) 03/13/2017 1252   K 4.0 07/04/2022 0408   K 4.3 03/13/2017 1252   CL 106 07/04/2022 0408   CO2 24 07/04/2022 0408   CO2 26 03/13/2017 1252   BUN 25 (H) 07/04/2022 0408   BUN 15 07/26/2019 1548   BUN 14.8 03/13/2017 1252   CREATININE 1.16 (H) 07/04/2022 0408   CREATININE 1.08 (H) 06/11/2021 1533   CREATININE 0.8 03/13/2017 1252      Component Value Date/Time   CALCIUM 8.7 (L) 07/04/2022 0408   CALCIUM 10.0 03/13/2017 1252   ALKPHOS 32 (L) 07/03/2022 0423   ALKPHOS 71 03/13/2017 1252   AST 44 (H) 07/03/2022 0423   AST 18 06/11/2021 1533   AST 18 03/13/2017 1252   ALT 57 (H) 07/03/2022 0423   ALT 14 06/11/2021 1533   ALT 18 03/13/2017 1252   BILITOT 0.6 07/03/2022 0423   BILITOT 0.5 06/11/2021 1533   BILITOT 0.54 03/13/2017 1252       No results  found for: "LABCA2"  No components found for: "[id number]"  No results for input(s): "INR" in the last 168 hours.  No results found for: "LABCA2"  No results found for: "[id number]"  No results found for: "CAN125"  No results found for: "CAN153"  No results found for: "CA2729"  No components found for: "HGQUANT"  No results found for: "CEA1", "CEA" / No results found for: "CEA1", "CEA"   No results found for: "AFPTUMOR"  No results found for: "CHROMOGRNA"  No results found for: "TOTALPROTELP", "ALBUMINELP", "A1GS", "A2GS", "BETS", "BETA2SER", "GAMS", "MSPIKE", "SPEI" (this displays SPEP labs)  No results found for: "KPAFRELGTCHN", "LAMBDASER", "KAPLAMBRATIO" (kappa/lambda light chains)  No results found for: "HGBA", "HGBA2QUANT", "HGBFQUANT", "HGBSQUAN" (Hemoglobinopathy evaluation)   No results found for: "LDH"  Lab Results  Component Value Date   IRON 59 12/18/2010   IRONPCTSAT 12.5 (L) 12/18/2010   (Iron and TIBC)  No results found for: "FERRITIN"  Urinalysis  Component Value Date/Time   COLORURINE YELLOW 06/04/2010 0845   APPEARANCEUR CLOUDY (A) 06/04/2010 0845   LABSPEC 1.011 06/04/2010 0845   PHURINE 7.5 06/04/2010 0845   GLUCOSEU NEGATIVE 06/04/2010 0845   HGBUR NEGATIVE 06/04/2010 0845   BILIRUBINUR NEGATIVE 06/04/2010 0845   KETONESUR NEGATIVE 06/04/2010 0845   PROTEINUR NEGATIVE 06/04/2010 0845   UROBILINOGEN 0.2 06/04/2010 0845   NITRITE NEGATIVE 06/04/2010 0845   LEUKOCYTESUR LARGE (A) 06/04/2010 0845    STUDIES: VAS [place] LOWER EXTREMITY VENOUS (DVT) (ONLY [facility])  Result Date: 07/01/2022  Lower Venous DVT Study Patient Name:  PEOLA MELLEY  Date of Exam:   06/30/2022 Medical Rec #: [id number]        Accession #:    [id number] Date of Birth: 09-03-1940        Patient Gender: F Patient Age:   82 years Exam Location:  [facility] Procedure:      VAS [place] LOWER EXTREMITY VENOUS (DVT) Referring Phys: Erin Hearing  --------------------------------------------------------------------------------  Indications: Pulmonary embolism.  Limitations: Poor ultrasound/tissue interface. Comparison Study: No previous exams Performing Technologist: Jody Hill RVT, RDMS  Examination Guidelines: A complete evaluation includes B-mode imaging, spectral Doppler, color Doppler, and power Doppler as needed of all accessible portions of each vessel. Bilateral testing is considered an integral part of a complete examination. Limited examinations for reoccurring indications may be performed as noted. The reflux portion of the exam is performed with the patient in reverse Trendelenburg.  +---------+---------------+---------+-----------+----------+-------------------+ RIGHT    CompressibilityPhasicitySpontaneityPropertiesThrombus Aging      +---------+---------------+---------+-----------+----------+-------------------+ CFV      Full           Yes      Yes                                      +---------+---------------+---------+-----------+----------+-------------------+ SFJ      Full                                                             +---------+---------------+---------+-----------+----------+-------------------+ FV Prox  Full           Yes      Yes                                      +---------+---------------+---------+-----------+----------+-------------------+ FV Mid   Full           Yes      Yes                                      +---------+---------------+---------+-----------+----------+-------------------+ FV DistalFull           Yes      Yes                                      +---------+---------------+---------+-----------+----------+-------------------+ PFV      Full                                                             +---------+---------------+---------+-----------+----------+-------------------+  POP      None           No       No                   Acute                +---------+---------------+---------+-----------+----------+-------------------+ PTV      Full                                                             +---------+---------------+---------+-----------+----------+-------------------+ PERO                                                  Not well visualized +---------+---------------+---------+-----------+----------+-------------------+   +---------+---------------+---------+-----------+----------+-------------------+ LEFT     CompressibilityPhasicitySpontaneityPropertiesThrombus Aging      +---------+---------------+---------+-----------+----------+-------------------+ CFV      Full           Yes      Yes                                      +---------+---------------+---------+-----------+----------+-------------------+ SFJ      Full                                                             +---------+---------------+---------+-----------+----------+-------------------+ FV Prox  Full           Yes      Yes                                      +---------+---------------+---------+-----------+----------+-------------------+ FV Mid   Full           Yes      Yes                                      +---------+---------------+---------+-----------+----------+-------------------+ FV DistalFull           Yes      Yes                                      +---------+---------------+---------+-----------+----------+-------------------+ PFV      Full                                                             +---------+---------------+---------+-----------+----------+-------------------+ POP      Full                                                             +---------+---------------+---------+-----------+----------+-------------------+  PTV                                                   Not well visualized  +---------+---------------+---------+-----------+----------+-------------------+ PERO     Full                                                             +---------+---------------+---------+-----------+----------+-------------------+     Summary: BILATERAL: -No evidence of popliteal cyst, bilaterally. RIGHT: - Findings consistent with acute deep vein thrombosis involving the right popliteal vein.  LEFT: - There is no evidence of deep vein thrombosis in the lower extremity.  *See table(s) above for measurements and observations. Electronically signed by Deitra Mayo MD on 07/01/2022 at 3:47:54 PM.    Final    US RENAL  Result Date: 07/01/2022 CLINICAL DATA:  82 year old female with elevated serum creatinine. EXAM: RENAL / URINARY TRACT ULTRASOUND COMPLETE COMPARISON:  09/11/2021 CT. FINDINGS: Right Kidney: Renal measurements: 9.5 x 5.3 x 5.7 cm = volume: 149 mL. Echogenicity within normal limits. Cortical thinning is noted. There is no evidence of hydronephrosis or solid mass. Left Kidney: Renal measurements: 10.3 x 4.6 x 4.7 cm = volume: 116 mL. Echogenicity is within normal limits. Cortical thinning is noted. There is no evidence of hydronephrosis or solid renal mass. Bladder: Appears normal for degree of bladder distention. Other: None. IMPRESSION: 1. Bilateral renal cortical thinning. Renal echogenicity within normal limits. 2. No evidence of hydronephrosis or solid renal mass. Electronically Signed   By: Margarette Canada M.D.   On: 07/01/2022 10:07   ECHOCARDIOGRAM COMPLETE  Result Date: 06/30/2022    ECHOCARDIOGRAM REPORT   Patient Name:   GENEAL FARANDA Date of Exam: 06/30/2022 Medical Rec #:  [id number]       Height:       63.0 in Accession #:    [id number]      Weight:       185.0 lb Date of Birth:  10/22/1940       BSA:          1.871 m Patient Age:    77 years        BP:           166/91 mmHg Patient Gender: F               HR:           104 bpm. Exam Location:  Inpatient Procedure: 2D Echo,  Cardiac Doppler and Color Doppler Indications:    Pulmonary Embolus I26.09  History:        Patient has no prior history of Echocardiogram examinations.                 Signs/Symptoms:Dyspnea, Chest Pain and Fatigue; Risk                 Factors:Dyslipidemia.  Sonographer:    Luane School RDCS Referring Phys: [id number] Samella Parr  Sonographer Comments: Technically difficult study due to poor echo windows. Image acquisition challenging due to respiratory motion and supine position. IMPRESSIONS  1. Left ventricular ejection fraction, by estimation, is 60 to 65%. The left ventricle has normal  function. The left ventricle has no regional wall motion abnormalities. There is moderate left ventricular hypertrophy. Left ventricular diastolic parameters are consistent with Grade I diastolic dysfunction (impaired relaxation).  2. Right ventricular systolic function is moderately reduced. The right ventricular size is normal. There is normal pulmonary artery systolic pressure. Free wall hypokinesis with normal apical systolic function consistent with McConnell's sign as can be  seen in acute PE  3. The mitral valve is normal in structure. No evidence of mitral valve regurgitation. No evidence of mitral stenosis.  4. The aortic valve is tricuspid. Aortic valve regurgitation is not visualized. Aortic valve sclerosis is present, with no evidence of aortic valve stenosis. FINDINGS  Left Ventricle: Left ventricular ejection fraction, by estimation, is 60 to 65%. The left ventricle has normal function. The left ventricle has no regional wall motion abnormalities. The left ventricular internal cavity size was small. There is moderate  left ventricular hypertrophy. Left ventricular diastolic parameters are consistent with Grade I diastolic dysfunction (impaired relaxation). Right Ventricle: The right ventricular size is normal. Right vetricular wall thickness was not well visualized. Right ventricular systolic function is moderately  reduced. There is normal pulmonary artery systolic pressure. The tricuspid regurgitant velocity is 2.78 m/s, and with an assumed right atrial pressure of 3 mmHg, the estimated right ventricular systolic pressure is [id number] mmHg. Left Atrium: Left atrial size was normal in size. Right Atrium: Right atrial size was normal in size. Pericardium: Trivial pericardial effusion is present. Presence of epicardial fat layer. Mitral Valve: The mitral valve is normal in structure. No evidence of mitral valve regurgitation. No evidence of mitral valve stenosis. Tricuspid Valve: The tricuspid valve is normal in structure. Tricuspid valve regurgitation is trivial. Aortic Valve: The aortic valve is tricuspid. Aortic valve regurgitation is not visualized. Aortic valve sclerosis is present, with no evidence of aortic valve stenosis. Pulmonic Valve: The pulmonic valve was not well visualized. Pulmonic valve regurgitation is trivial. Aorta: The aortic root and ascending aorta are structurally normal, with no evidence of dilitation. IAS/Shunts: The interatrial septum was not well visualized.  LEFT VENTRICLE PLAX 2D LVIDd:         3.40 cm   Diastology LVIDs:         2.70 cm   LV e' medial:    4.43 cm/s LV PW:         1.50 cm   LV E/e' medial:  9.7 LV IVS:        1.50 cm   LV e' lateral:   3.98 cm/s LVOT diam:     2.00 cm   LV E/e' lateral: 10.8 LV SV:         44 LV SV Index:   24 LVOT Area:     3.14 cm  RIGHT VENTRICLE            IVC RV S prime:     8.81 cm/s  IVC diam: 1.50 cm TAPSE (M-mode): 1.8 cm LEFT ATRIUM           Index        RIGHT ATRIUM           Index LA diam:      3.40 cm 1.82 cm/m   RA Area:     10.10 cm LA Vol (A4C): 45.3 ml 24.22 ml/m  RA Volume:   19.40 ml  10.37 ml/m  AORTIC VALVE             PULMONIC VALVE LVOT Vmax:   93.73  cm/s  PR End Diast Vel: 7.62 msec LVOT Vmean:  67.233 cm/s LVOT VTI:    0.141 m  AORTA Ao Root diam: 3.00 cm Ao Asc diam:  3.00 cm Ao Desc diam: 1.90 cm MITRAL VALVE               TRICUSPID VALVE  MV Area (PHT): 4.57 cm    TR Peak grad:   30.9 mmHg MV Decel Time: 166 msec    TR Vmax:        278.00 cm/s MV E velocity: 42.80 cm/s MV A velocity: 78.00 cm/s  SHUNTS MV E/A ratio:  0.55        Systemic VTI:  0.14 m                            Systemic Diam: 2.00 cm Oswaldo Milian MD Electronically signed by Oswaldo Milian MD Signature Date/Time: 06/30/2022/1:49:26 PM    Final    DG Abd Portable 2 Views  Result Date: 06/30/2022 CLINICAL DATA:  82 year old female with history of abdominal distension and nausea. EXAM: PORTABLE ABDOMEN - 2 VIEW COMPARISON:  No priors. FINDINGS: Gas and stool are seen scattered throughout the colon extending to the level of the distal rectum. No pathologic distension of small bowel is noted. No gross evidence of pneumoperitoneum. Iodinated contrast material filling the lumen of the urinary bladder. IMPRESSION: 1. Nonobstructive bowel gas pattern. 2. No pneumoperitoneum. Electronically Signed   By: Vinnie Langton M.D.   On: 06/30/2022 10:37   CT Angio Chest PE W and/or Wo Contrast  Result Date: 06/30/2022 CLINICAL DATA:  Shortness of breath for the past week, worsening today. EXAM: CT ANGIOGRAPHY CHEST WITH CONTRAST TECHNIQUE: Multidetector CT imaging of the chest was performed using the standard protocol during bolus administration of intravenous contrast. Multiplanar CT image reconstructions and MIPs were obtained to evaluate the vascular anatomy. RADIATION DOSE REDUCTION: This exam was performed according to the departmental dose-optimization program which includes automated exposure control, adjustment of the mA and/or kV according to patient size and/or use of iterative reconstruction technique. CONTRAST:  143m OMNIPAQUE IOHEXOL 350 MG/ML SOLN COMPARISON:  Portable chest today, chest radiograph 09/20/2021, CTA chest 02/25/2022. FINDINGS: Cardiovascular: There is mild cardiomegaly with a right chamber predominance and RV/LV ratio of 1.31 indicating right heart  strain. The pulmonary trunk is 3 cm and both main pulmonary arteries approaching 3 cm indicating arterial hypertension. There are bilateral near-occlusive clots, almost certainly acute, in both distal main pulmonary arteries with extension into the bilateral lobar main arteries, segmental arteries and a few downstream subsegmental arterial branches with overall large clot burden. There is no pericardial effusion. There is scattered calcification in the left main and LAD coronary artery. There is aortic atherosclerosis and tortuosity without aneurysm or dissection. The great vessels are not well seen due to metal spray artifact from bilateral shoulder replacements but they are grossly patent at least proximally. Pulmonary veins are decompressed. Mediastinum/Nodes: No enlarged mediastinal, hilar, or axillary lymph nodes. Thyroid gland, trachea, and esophagus demonstrate no significant findings. Surgical clips are again noted in the right axilla Lungs/Pleura: Difficult to evaluate due to abundant respiratory motion artifacts. There are trace pleural effusions. There is a calcified right lower lobe granuloma laterally. Scattered linear scar-like opacities both bases. No confluent dense infiltrate is seen. There is no appreciable noncalcified nodule. No pleural thickening or pneumothorax. Upper Abdomen: Hepatic steatosis. No acute abnormality accounting for respiratory motion. Moderate pancreatic atrophy.  Musculoskeletal: There is osteopenia, thoracic kyphosis and advanced degenerative change of the thoracic spine. 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 The ribcage is intact. Review of the MIP images confirms the above findings. IMPRESSION: 1. Positive for acute PE with CT evidence of right heart strain (RV/LV Ratio = 1.31 ) consistent with at least submassive (intermediate risk) PE. The presence of right heart strain has been associated with an increased risk of morbidity and  mortality. Please refer to the "PE Focused" order set in EPIC. 2. Aortic and coronary artery atherosclerosis. 3. Trace pleural effusions. 4. Hepatic steatosis. 5. Results phoned to Dr. Leonette Monarch in the ED at 3:03 a.m., 06/30/2022, with verbal acknowledgement of the key findings. Aortic Atherosclerosis (ICD10-I70.0). Electronically Signed   By: Telford Nab M.D.   On: 06/30/2022 03:15   DG Chest Port 1 View  Result Date: 06/30/2022 CLINICAL DATA:  Shortness of breath x1 week. EXAM: PORTABLE CHEST 1 VIEW COMPARISON:  September 20, 2021 FINDINGS: The heart size and mediastinal contours are within normal limits. Low lung volumes are noted. Both lungs are clear. Radiopaque surgical clips are seen along the lateral aspect of the right chest wall. Bilateral shoulder replacements are seen. Multilevel degenerative changes are noted throughout the thoracic spine. IMPRESSION: Low lung volumes without active cardiopulmonary disease. Electronically Signed   By: Virgina Norfolk M.D.   On: 06/30/2022 00:55     ELIGIBLE FOR AVAILABLE RESEARCH PROTOCOL: no  ASSESSMENT: 82 y.o. [place] woman status post left breast upper outer quadrant biopsy 02/09/2017 for a clinical T1b N0, stage IA invasive ductal carcinoma, grade 1, estrogen and progesterone receptor positive, HER-2 nonamplified, with an MIB-1 of 2%  (1) left lumpectomy with sentinel lymph node sampling  03/16/2017 showed a pT1b pN0, stage IA invasive ductal carcinoma, grade 1, with negative margins (2) adjuvant radiation not anticipated  (2) adjuvant radiation not felt to be needed if patient takes antiestrogen for 5 years  (3) tamoxifen started 04/10/2017  (a) status post remote hysterectomy with bilateral salpingo-oophorectomy  (b) bone density at [facility] 03/30/2017 shows a T score of -1.0   PLAN:  *Total Encounter Time as defined by the Centers for Medicare and Medicaid Services includes, in addition to the face-to-face time of a patient visit (documented  in the note above) non-face-to-face time: obtaining and reviewing outside history, ordering and reviewing medications, tests or procedures, care coordination (communications with other health care professionals or caregivers) and documentation in the medical record.

## 2022-07-14 ENCOUNTER — Telehealth: Payer: Self-pay | Admitting: Cardiology

## 2022-07-14 NOTE — Telephone Encounter (Signed)
LMTCB.

## 2022-07-14 NOTE — Telephone Encounter (Signed)
  Per MyChart Scheduling message:  Was in hospital with blood clots in lungs and right leg. Now having spasms upper left chest, around heart & into neck, which then gets heavy and tight. Walked to bathroom & bed tonight, couldn't catch breath- pulse 125, O2 was 93, BP 161/101. Sat for about 5 minutes and worked on breathing, then pulse 97, O2 97 and breathing better. Not on Eliquis yet bc insurance won't approve and [facility] needs to straighten out with [facility]!! Need appointment asap!! Thank you

## 2022-07-14 NOTE — Telephone Encounter (Signed)
Daughter returned RN's call.

## 2022-07-14 NOTE — Telephone Encounter (Signed)
Called patient, LVM to call back to discuss.  Left call back number.   

## 2022-07-15 ENCOUNTER — Emergency Department (HOSPITAL_COMMUNITY): Payer: MEDICARE

## 2022-07-15 ENCOUNTER — Inpatient Hospital Stay (HOSPITAL_COMMUNITY)
Admission: EM | Admit: 2022-07-15 | Discharge: 2022-07-17 | DRG: 175 | Disposition: A | Discharge status: Home Health | Payer: MEDICARE | Attending: Family Medicine | Admitting: Family Medicine

## 2022-07-15 ENCOUNTER — Other Ambulatory Visit: Payer: Self-pay

## 2022-07-15 ENCOUNTER — Other Ambulatory Visit (HOSPITAL_COMMUNITY): Payer: Self-pay

## 2022-07-15 ENCOUNTER — Encounter (HOSPITAL_COMMUNITY): Payer: Self-pay

## 2022-07-15 DIAGNOSIS — Z96612 Presence of left artificial shoulder joint: Secondary | ICD-10-CM | POA: Diagnosis present

## 2022-07-15 DIAGNOSIS — Z886 Allergy status to analgesic agent status: Secondary | ICD-10-CM

## 2022-07-15 DIAGNOSIS — Z8249 Family history of ischemic heart disease and other diseases of the circulatory system: Secondary | ICD-10-CM

## 2022-07-15 DIAGNOSIS — Z79899 Other long term (current) drug therapy: Secondary | ICD-10-CM | POA: Diagnosis not present

## 2022-07-15 DIAGNOSIS — K219 Gastro-esophageal reflux disease without esophagitis: Secondary | ICD-10-CM | POA: Diagnosis present

## 2022-07-15 DIAGNOSIS — Z7901 Long term (current) use of anticoagulants: Secondary | ICD-10-CM

## 2022-07-15 DIAGNOSIS — Z882 Allergy status to sulfonamides status: Secondary | ICD-10-CM

## 2022-07-15 DIAGNOSIS — Z885 Allergy status to narcotic agent status: Secondary | ICD-10-CM | POA: Diagnosis not present

## 2022-07-15 DIAGNOSIS — E785 Hyperlipidemia, unspecified: Secondary | ICD-10-CM | POA: Diagnosis present

## 2022-07-15 DIAGNOSIS — I10 Essential (primary) hypertension: Secondary | ICD-10-CM | POA: Diagnosis present

## 2022-07-15 DIAGNOSIS — R42 Dizziness and giddiness: Secondary | ICD-10-CM | POA: Diagnosis present

## 2022-07-15 DIAGNOSIS — I82431 Acute embolism and thrombosis of right popliteal vein: Secondary | ICD-10-CM | POA: Diagnosis present

## 2022-07-15 DIAGNOSIS — I2699 Other pulmonary embolism without acute cor pulmonale: Principal | ICD-10-CM | POA: Diagnosis present

## 2022-07-15 DIAGNOSIS — Z79810 Long term (current) use of selective estrogen receptor modulators (SERMs): Secondary | ICD-10-CM

## 2022-07-15 DIAGNOSIS — R0602 Shortness of breath: Secondary | ICD-10-CM

## 2022-07-15 DIAGNOSIS — Z96611 Presence of right artificial shoulder joint: Secondary | ICD-10-CM | POA: Diagnosis present

## 2022-07-15 DIAGNOSIS — Z91148 Patient's other noncompliance with medication regimen for other reason: Secondary | ICD-10-CM

## 2022-07-15 DIAGNOSIS — C50911 Malignant neoplasm of unspecified site of right female breast: Secondary | ICD-10-CM | POA: Diagnosis present

## 2022-07-15 DIAGNOSIS — J9601 Acute respiratory failure with hypoxia: Secondary | ICD-10-CM | POA: Diagnosis present

## 2022-07-15 DIAGNOSIS — F03A3 Unspecified dementia, mild, with mood disturbance: Secondary | ICD-10-CM | POA: Diagnosis present

## 2022-07-15 DIAGNOSIS — E039 Hypothyroidism, unspecified: Secondary | ICD-10-CM | POA: Diagnosis present

## 2022-07-15 DIAGNOSIS — Z87891 Personal history of nicotine dependence: Secondary | ICD-10-CM | POA: Diagnosis not present

## 2022-07-15 DIAGNOSIS — Z91040 Latex allergy status: Secondary | ICD-10-CM | POA: Diagnosis not present

## 2022-07-15 DIAGNOSIS — Z8051 Family history of malignant neoplasm of kidney: Secondary | ICD-10-CM

## 2022-07-15 DIAGNOSIS — F32A Depression, unspecified: Secondary | ICD-10-CM | POA: Diagnosis present

## 2022-07-15 DIAGNOSIS — F0390 Unspecified dementia without behavioral disturbance: Secondary | ICD-10-CM | POA: Diagnosis present

## 2022-07-15 DIAGNOSIS — Z86711 Personal history of pulmonary embolism: Secondary | ICD-10-CM | POA: Diagnosis not present

## 2022-07-15 DIAGNOSIS — F03A4 Unspecified dementia, mild, with anxiety: Secondary | ICD-10-CM | POA: Diagnosis present

## 2022-07-15 DIAGNOSIS — F419 Anxiety disorder, unspecified: Secondary | ICD-10-CM | POA: Diagnosis present

## 2022-07-15 DIAGNOSIS — I5189 Other ill-defined heart diseases: Secondary | ICD-10-CM

## 2022-07-15 HISTORY — DX: Other ill-defined heart diseases: I51.89

## 2022-07-15 HISTORY — DX: Other pulmonary embolism without acute cor pulmonale: I26.99

## 2022-07-15 LAB — COMPREHENSIVE METABOLIC PANEL
ALT: 18 U/L (ref 0–44)
AST: 21 U/L (ref 15–41)
Albumin: 3.1 g/dL — ABNORMAL LOW (ref 3.5–5.0)
Alkaline Phosphatase: 35 U/L — ABNORMAL LOW (ref 38–126)
Anion gap: 9 (ref 5–15)
BUN: 18 mg/dL (ref 8–23)
CO2: 20 mmol/L — ABNORMAL LOW (ref 22–32)
Calcium: 9 mg/dL (ref 8.9–10.3)
Chloride: 109 mmol/L (ref 98–111)
Creatinine, Ser: 1.19 mg/dL — ABNORMAL HIGH (ref 0.44–1.00)
GFR, Estimated: 46 mL/min — ABNORMAL LOW (ref >60)
Glucose, Bld: 134 mg/dL — ABNORMAL HIGH (ref 70–99)
Potassium: 4.4 mmol/L (ref 3.5–5.1)
Sodium: 138 mmol/L (ref 135–145)
Total Bilirubin: 0.4 mg/dL (ref 0.3–1.2)
Total Protein: 6.1 g/dL — ABNORMAL LOW (ref 6.5–8.1)

## 2022-07-15 LAB — BASIC METABOLIC PANEL
Anion gap: 11 (ref 5–15)
BUN: 18 mg/dL (ref 8–23)
CO2: 20 mmol/L — ABNORMAL LOW (ref 22–32)
Calcium: 9.3 mg/dL (ref 8.9–10.3)
Chloride: 105 mmol/L (ref 98–111)
Creatinine, Ser: 1.42 mg/dL — ABNORMAL HIGH (ref 0.44–1.00)
GFR, Estimated: 37 mL/min — ABNORMAL LOW (ref >60)
Glucose, Bld: 151 mg/dL — ABNORMAL HIGH (ref 70–99)
Potassium: 5.2 mmol/L — ABNORMAL HIGH (ref 3.5–5.1)
Sodium: 136 mmol/L (ref 135–145)

## 2022-07-15 LAB — CBC WITH DIFFERENTIAL/PLATELET
Abs Immature Granulocytes: 0.03 10*3/uL (ref 0.00–0.07)
Basophils Absolute: 0.1 10*3/uL (ref 0.0–0.1)
Basophils Relative: 1 %
Eosinophils Absolute: 0.1 10*3/uL (ref 0.0–0.5)
Eosinophils Relative: 1 %
HCT: 36.4 % (ref 36.0–46.0)
Hemoglobin: 11.8 g/dL — ABNORMAL LOW (ref 12.0–15.0)
Immature Granulocytes: 0 %
Lymphocytes Relative: 12 %
Lymphs Abs: 1.1 10*3/uL (ref 0.7–4.0)
MCH: 30.3 pg (ref 26.0–34.0)
MCHC: 32.4 g/dL (ref 30.0–36.0)
MCV: 93.3 fL (ref 80.0–100.0)
Monocytes Absolute: 0.7 10*3/uL (ref 0.1–1.0)
Monocytes Relative: 8 %
Neutro Abs: 7.3 10*3/uL (ref 1.7–7.7)
Neutrophils Relative %: 78 %
Platelets: 223 10*3/uL (ref 150–400)
RBC: 3.9 MIL/uL (ref 3.87–5.11)
RDW: 12.5 % (ref 11.5–15.5)
WBC: 9.3 10*3/uL (ref 4.0–10.5)
nRBC: 0 % (ref 0.0–0.2)

## 2022-07-15 LAB — TROPONIN I (HIGH SENSITIVITY)
Troponin I (High Sensitivity): 31 ng/L — ABNORMAL HIGH (ref <18)
Troponin I (High Sensitivity): 41 ng/L — ABNORMAL HIGH (ref <18)

## 2022-07-15 LAB — PROTIME-INR
INR: 1.1 (ref 0.8–1.2)
Prothrombin Time: 13.7 seconds (ref 11.4–15.2)

## 2022-07-15 LAB — HEPARIN LEVEL (UNFRACTIONATED): Heparin Unfractionated: <0.1 IU/mL — ABNORMAL LOW (ref 0.30–0.70)

## 2022-07-15 LAB — MAGNESIUM: Magnesium: 1.9 mg/dL (ref 1.7–2.4)

## 2022-07-15 LAB — POTASSIUM: Potassium: 5.4 mmol/L — ABNORMAL HIGH (ref 3.5–5.1)

## 2022-07-15 MED ORDER — ACETAMINOPHEN 650 MG RE SUPP: 650.0000 mg | Freq: Four times a day (QID) | RECTAL | Status: DC | PRN

## 2022-07-15 MED ORDER — POLYETHYL GLYCOL-PROPYL GLYCOL 0.4-0.3 % OP GEL: Freq: Once | OPHTHALMIC | Status: DC

## 2022-07-15 MED ORDER — TAMSULOSIN HCL 0.4 MG PO CAPS
0.4000 mg | ORAL_CAPSULE | Freq: Every day | ORAL | Status: DC
  Administered 2022-07-16: 0.4 mg via ORAL
  Filled 2022-07-15: qty 1

## 2022-07-15 MED ORDER — LORATADINE 10 MG PO TABS: 10.0000 mg | ORAL_TABLET | Freq: Every day | ORAL | Status: DC

## 2022-07-15 MED ORDER — VENLAFAXINE HCL ER 75 MG PO CP24
150.0000 mg | ORAL_CAPSULE | Freq: Every day | ORAL | Status: DC
  Administered 2022-07-16 – 2022-07-17 (×2): 150 mg via ORAL
  Filled 2022-07-15 (×2): qty 2

## 2022-07-15 MED ORDER — DONEPEZIL HCL 5 MG PO TABS
10.0000 mg | ORAL_TABLET | Freq: Every day | ORAL | Status: DC
  Administered 2022-07-16 (×2): 10 mg via ORAL
  Filled 2022-07-15 (×2): qty 2

## 2022-07-15 MED ORDER — HEPARIN (PORCINE) 25000 UT/250ML-% IV SOLN
1200.0000 [IU]/h | INTRAVENOUS | Status: DC
  Administered 2022-07-15: 1200 [IU]/h via INTRAVENOUS
  Filled 2022-07-15: qty 250

## 2022-07-15 MED ORDER — IRBESARTAN 150 MG PO TABS
150.0000 mg | ORAL_TABLET | Freq: Every day | ORAL | Status: DC
  Administered 2022-07-16 (×2): 150 mg via ORAL
  Filled 2022-07-15 (×3): qty 1

## 2022-07-15 MED ORDER — PANTOPRAZOLE SODIUM 40 MG PO TBEC
40.0000 mg | DELAYED_RELEASE_TABLET | Freq: Every day | ORAL | Status: DC
  Administered 2022-07-16 – 2022-07-17 (×2): 40 mg via ORAL
  Filled 2022-07-15 (×2): qty 1

## 2022-07-15 MED ORDER — ACETAMINOPHEN 325 MG PO TABS
650.0000 mg | ORAL_TABLET | Freq: Four times a day (QID) | ORAL | Status: DC | PRN
  Administered 2022-07-16: 650 mg via ORAL
  Filled 2022-07-15: qty 2

## 2022-07-15 MED ORDER — HEPARIN (PORCINE) 25000 UT/250ML-% IV SOLN
1450.0000 [IU]/h | INTRAVENOUS | Status: AC
  Administered 2022-07-15: 1450 [IU]/h via INTRAVENOUS

## 2022-07-15 MED ORDER — MECLIZINE HCL 25 MG PO TABS: 25.0000 mg | ORAL_TABLET | ORAL | Status: DC | PRN

## 2022-07-15 MED ORDER — NITROGLYCERIN 0.4 MG SL SUBL: 0.4000 mg | SUBLINGUAL_TABLET | SUBLINGUAL | Status: DC | PRN

## 2022-07-15 MED ORDER — OXYBUTYNIN CHLORIDE 5 MG PO TABS
5.0000 mg | ORAL_TABLET | Freq: Two times a day (BID) | ORAL | Status: DC
  Administered 2022-07-16 – 2022-07-17 (×2): 5 mg via ORAL
  Filled 2022-07-15 (×2): qty 1

## 2022-07-15 MED ORDER — HEPARIN BOLUS VIA INFUSION
2000.0000 [IU] | INTRAVENOUS | Status: AC
  Administered 2022-07-15: 2000 [IU] via INTRAVENOUS
  Filled 2022-07-15: qty 2000

## 2022-07-15 MED ORDER — SENNOSIDES-DOCUSATE SODIUM 8.6-50 MG PO TABS: 1.0000 | ORAL_TABLET | Freq: Every day | ORAL | Status: DC | PRN

## 2022-07-15 MED ORDER — MEMANTINE HCL 5 MG PO TABS
10.0000 mg | ORAL_TABLET | Freq: Two times a day (BID) | ORAL | Status: DC
  Administered 2022-07-16 – 2022-07-17 (×4): 10 mg via ORAL
  Filled 2022-07-15 (×4): qty 2

## 2022-07-15 MED ORDER — MELATONIN 3 MG PO TABS
3.0000 mg | ORAL_TABLET | Freq: Every evening | ORAL | Status: DC | PRN
  Administered 2022-07-15: 3 mg via ORAL
  Filled 2022-07-15: qty 1

## 2022-07-15 MED ORDER — OLOPATADINE HCL 0.1 % OP SOLN: 1.0000 [drp] | Freq: Every day | OPHTHALMIC | Status: DC

## 2022-07-15 MED ORDER — HEPARIN BOLUS VIA INFUSION
2000.0000 [IU] | Freq: Once | INTRAVENOUS | Status: AC
  Administered 2022-07-15: 2000 [IU] via INTRAVENOUS
  Filled 2022-07-15: qty 2000

## 2022-07-15 MED ORDER — QUETIAPINE FUMARATE 50 MG PO TABS
50.0000 mg | ORAL_TABLET | Freq: Every day | ORAL | Status: DC
  Administered 2022-07-16 (×2): 50 mg via ORAL
  Filled 2022-07-15 (×2): qty 1

## 2022-07-15 MED ORDER — ENOXAPARIN SODIUM 100 MG/ML IJ SOSY
90.0000 mg | PREFILLED_SYRINGE | Freq: Two times a day (BID) | INTRAMUSCULAR | Status: DC
  Administered 2022-07-15 – 2022-07-17 (×4): 90 mg via SUBCUTANEOUS
  Filled 2022-07-15 (×6): qty 0.9

## 2022-07-15 NOTE — Progress Notes (Signed)
  Carryover admission to the Day Admitter.  I discussed this case with the EDP, Antonietta Breach, PA.  Per these discussions:   This is a 82 year old female who was just hospitalized at [facility] from 06/30/2022 to 07/05/2022 for acute bilateral pulmonary emboli complicated by evidence of right heart strain on CTA chest.  She was initially treated with IV heparin before being transitioned to Eliquis upon which she was discharged.  However, following discharged home, the patient has been unable to procure additional Eliquis due to complications with her insurance.  Consequently, she has been completely off of anticoagulation since discharged home on 07/05/2022.  In the setting, she presents back to the emergency department this evening complaining of interval worsening of shortness of breath as well as pleuritic chest discomfort, in addition to a single near syncopal episode on 07/14/2022.   Blood pressure reportedly stable, but the patient is mildly tachycardic at presentation.  Reportedly had an oxygen saturation in the high 80s at home on room air.  Here, O2 sats in the low to mid 90s on room air.  Restarted on heparin drip.   Of note, her troponin this evening is trending down relative to most recent prior.  Chest x-ray without evidence of interval acute cardiopulmonary process.  I have placed an order for observation for further evaluation and management of the above, including assistance with reconciliation of outpatient DOAC plan.   I have placed some additional preliminary admit orders via the adult multi-morbid admission order set. I have also ordered morning labs in the form of CMP, CBC, and serum magnesium level.  Additionally, I placed order with the transitional care team to explore affordability aspects/insurance coverage regarding outpatient DOAC options.     Babs Bertin, DO Hospitalist

## 2022-07-15 NOTE — Progress Notes (Signed)
ANTICOAGULATION CONSULT NOTE - Initial Consult  Pharmacy Consult for Heparin Indication: pulmonary embolus  Allergies  Allergen Reactions   Latex Rash   Sulfa Antibiotics Anaphylaxis and Nausea Only   Sulfamethoxazole-Trimethoprim Anaphylaxis and Nausea Only   Aspirin Other (See Comments)    GI bleeding   Nsaids Other (See Comments)    GI upset    Oxycodone-Acetaminophen Nausea Only    Patient Measurements:   Heparin Dosing Weight: 70kg   Vital Signs: Temp: 97.3 F (36.3 C) (01/16 0100) BP: 116/65 (01/16 0100) Pulse Rate: 111 (01/16 0100)  Labs: No results for input(s): "HGB", "HCT", "PLT", "APTT", "LABPROT", "INR", "HEPARINUNFRC", "HEPRLOWMOCWT", "CREATININE", "CKTOTAL", "CKMB", "TROPONINIHS" in the last 72 hours.  Estimated Creatinine Clearance: 40.1 mL/min (A) (by C-G formula based on SCr of 1.16 mg/dL (H)).   Medical History: Past Medical History:  Diagnosis Date   Allergy    latex   Anemia    Anxiety    Arthritis    Cancer ([place]) 01/2017   right breast cancer   Depression    Dyslipidemia    Eustachian tube dysfunction    GERD (gastroesophageal reflux disease)    Hx of cardiovascular stress test    Myoview 10/13:  apical thinning, EF 72%, no ischemia   Hx of colonic polyps    Dr Watt Climes   Hypertension    Hypothyroidism    Other abnormal glucose     Medications:  Infusions:   Assessment: 57 yoF diagnosed with DVT/PE on 06/30/22.  She was discharged with prescription for Eliquis which she never been able to fill due to cost so has been taking aspirin '325mg'$ . (Per benefits check during last admission, her copay for initial prescription is $551 due to deductible and then $93/month thereafter).   She returns today with chest pain & shortness of breath.  Pharmacy consulted to start IV heparin.   Baseline CBC: Hg slightly low at 11.8; pltc WNL at 223, INR 1.1  Goal of Therapy:  Heparin level 0.3-0.7 units/ml Monitor platelets by anticoagulation protocol:  Yes   Plan:  Heparin 2000 units IV x1 Heparin infusion at 1200 units/hr (previously therapeutic on this rate) Check heparin level in 8h Daily heparin level & CBC while on heparin F/U long-term AC plan-   Netta Cedars PharmD 07/15/2022,1:16 AM

## 2022-07-15 NOTE — H&P (Signed)
History and Physical    Patient: Brenda Stephens JOA:[id number] DOB: [birth year] DOA: 07/15/2022 DOS: the patient was seen and examined on 07/15/2022 PCP: Jola Baptist, PA-C  Patient coming from: Home  Chief Complaint:  Chief Complaint  Patient presents with   Shortness of Breath   HPI: Brenda Stephens is a 82 y.o. female with medical history significant of seasonal allergies, anemia, anxiety, depression, right breast cancer, hyperlipidemia, was taken to dysfunction, GERD, colon polyps, hypertension, hypothyroidism who was recently admitted and discharged due to pulmonary embolus from 06/30/2022 until 07/05/2022, but had difficulty with the apixaban prescription and is returning to the emergency department today due to progressively worse dyspnea associated with pleuritic chest pain for the past 3 days.  No fever, chills or night sweats. No sore throat, rhinorrhea, dyspnea, wheezing or hemoptysis.  No palpitations, diaphoresis, PND, orthopnea or pitting edema of the lower extremities.  No abdominal pain, diarrhea, constipation, melena or hematochezia.  No flank pain, dysuria, frequency or hematuria.  No polyuria, polydipsia, polyphagia or blurred vision.  ED course: Initial vital signs were temperature 97.3 F, pulse 115, respirations 23, BP 116/65 mmHg O2 sat 93% on room air.  Most recent O2 sat is 97% on nasal cannula oxygen.  The patient was started on a heparin infusion after a 2000 unit heparin bolus.  Lab work: CBC showed a white count of 9.3, hemoglobin 11.8 g/dL and platelets 223.  BMP showed potassium of 5.2 and CO2 of 20 mmol/L with a normal anion gap.  The rest of the electrolytes and BUN were normal.  Glucose 151 and creatinine 1.42 mg/dL.  Troponin was 3141 ng/L.  Most recent potassium level was 4.4 mmol/L.  Imaging: Portable 1 view chest radiograph with persistent pulmonary venous congestion.  There is aortic atherosclerosis.   Review of Systems: As mentioned in the history of  present illness. All other systems reviewed and are negative. Past Medical History:  Diagnosis Date   Allergy    latex   Anemia    Anxiety    Arthritis    Cancer ([place]) 01/2017   right breast cancer   Depression    Dyslipidemia    Eustachian tube dysfunction    GERD (gastroesophageal reflux disease)    Hx of cardiovascular stress test    Myoview 10/13:  apical thinning, EF 72%, no ischemia   Hx of colonic polyps    Dr Watt Climes   Hypertension    Hypothyroidism    Other abnormal glucose    Past Surgical History:  Procedure Laterality Date   ABDOMINAL HYSTERECTOMY     BSO for uterine fibroids   ACHILLES TENDON SURGERY Right 10/07/2012   Procedure: RIGHT REPAIR RUPTURE TENDON PRIMARY OPEN/PERCUTANEOUS ;  Surgeon: Ninetta Lights, MD;  Location: [facility];  Service: Orthopedics;  Laterality: Right;   APPENDECTOMY     BREAST LUMPECTOMY WITH RADIOACTIVE SEED AND SENTINEL LYMPH NODE BIOPSY Right 03/16/2017   Procedure: RIGHT BREAST LUMPECTOMY WITH RADIOACTIVE SEED AND RIGHT AXILLARY SENTINEL  NODE BIOPSY ERAS PATHWAY;  Surgeon: [name], MD;  Location: [facility];  Service: General;  Laterality: Right;  PECTORAL BLOCK   CATARACT EXTRACTION, BILATERAL     CHOLECYSTECTOMY     COLONOSCOPY W/ POLYPECTOMY     Dr Watt Climes   EAR CYST EXCISION Right 10/07/2012   Procedure: EXCISION BONE CYST BENIGN TUMOR [name] ;  Surgeon: Ninetta Lights, MD;  Location: [facility];  Service: Orthopedics;  Laterality:  Right;   EXPLORATORY LAPAROTOMY     LEFT HEART CATH AND CORONARY ANGIOGRAPHY N/A 08/02/2019   Procedure: LEFT HEART CATH AND CORONARY ANGIOGRAPHY;  Surgeon: Martinique, Peter M, MD;  Location: [facility];  Service: Cardiovascular;  Laterality: N/A;   REVISION TOTAL SHOULDER TO REVERSE TOTAL SHOULDER Left 01/12/2019   Procedure: REVISION TOTAL SHOULDER TO REVERSE TOTAL SHOULDER;  Surgeon: Hiram Gash, MD;  Location: [facility];  Service:  Orthopedics;  Laterality: Left;   SINUS SURGERY WITH INSTATRAK     TOTAL SHOULDER REPLACEMENT     Right shoulder   TOTAL SHOULDER REPLACEMENT  05/2010   Left   TUBAL LIGATION     Social History:  reports that she quit smoking about 60 years ago. Her smoking use included cigarettes. She has a 3.00 pack-year smoking history. She has never used smokeless tobacco. She reports current alcohol use of about 3.0 standard drinks of alcohol per week. She reports that she does not use drugs.  Allergies  Allergen Reactions   Latex Rash   Sulfa Antibiotics Anaphylaxis and Nausea Only   Sulfamethoxazole-Trimethoprim Anaphylaxis and Nausea Only   Aspirin Other (See Comments)    GI bleeding   Nsaids Other (See Comments)    GI upset    Oxycodone-Acetaminophen Nausea Only    Family History  Problem Relation Age of Onset   Hiatal hernia Mother    Heart attack Mother 75   Heart disease Father 32       CAD @ autopsy   Cancer Father        renal cancer   Heart attack Brother 46       fatal   Diabetes Neg Hx    Stroke Neg Hx     Prior to Admission medications   Medication Sig Start Date End Date Taking? Authorizing Provider  acetaminophen (TYLENOL) 650 MG CR tablet Take 1 tablet (650 mg total) by mouth every 8 (eight) hours as needed for pain (headaches). 07/05/22   Eugenie Filler, MD  apixaban (ELIQUIS) 5 MG TABS tablet Take 2 tablets (10 mg total) by mouth 2 (two) times daily for 5 days, THEN 2 tablets (10 mg total) 2 (two) times daily. 07/05/22 09/08/22  Eugenie Filler, MD  Biotin 10000 MCG TABS Take 10,000 mcg by mouth daily.    [provider]  cetirizine (ZYRTEC) 10 MG tablet Take 10 mg by mouth at bedtime.    [provider]  donepezil (ARICEPT) 10 MG tablet Take 10 mg by mouth at bedtime. 12/27/21   [provider]  esomeprazole (NEXIUM) 20 MG capsule Take 40 mg by mouth daily.    [provider]  hydrOXYzine (ATARAX) 25 MG tablet Take 1 tablet (25  mg total) by mouth every 6 (six) hours as needed for anxiety. 07/05/22   Eugenie Filler, MD  meclizine (ANTIVERT) 25 MG tablet Take 1 tablet (25 mg total) by mouth 3 (three) times daily as needed for dizziness. Patient taking differently: Take 25 mg by mouth as needed for dizziness or nausea. 09/20/21   Lupita Dawn, MD  memantine (NAMENDA) 10 MG tablet Take 10 mg by mouth 2 (two) times daily. 12/27/21   [provider]  nitroGLYCERIN (NITROSTAT) 0.4 MG SL tablet Place 1 tablet (0.4 mg total) under the tongue every 5 (five) minutes as needed for chest pain. 07/05/22   Eugenie Filler, MD  Olopatadine HCl (PATADAY OP) Place 1 drop into both eyes daily.  [provider]  oxybutynin (DITROPAN) 5 MG tablet Take 5 mg by mouth 2 (two) times daily.    [provider]  Polyethyl Glycol-Propyl Glycol (SYSTANE OP) Place 1 drop into both eyes in the morning and at bedtime.    [provider]  QUEtiapine (SEROQUEL) 50 MG tablet Take 50 mg by mouth at bedtime. 04/22/22   [provider]  sennosides-docusate sodium (SENOKOT-S) 8.6-50 MG tablet Take 1 tablet by mouth daily as needed for constipation.    [provider]  tamsulosin (FLOMAX) 0.4 MG CAPS capsule Take 0.4 mg by mouth daily.    [provider]  valsartan (DIOVAN) 160 MG tablet Take 160 mg by mouth daily. 04/04/22   [provider]  venlafaxine XR (EFFEXOR-XR) 150 MG 24 hr capsule TAKE 1 CAPSULE (150 MG TOTAL) BY MOUTH DAILY. Patient taking differently: Take 150 mg by mouth daily. 12/14/12   Hendricks Limes, MD    Physical Exam: Vitals:   07/15/22 0515 07/15/22 0530 07/15/22 0545 07/15/22 0607  BP: 121/76 110/74 112/71   Pulse: 91 81 88   Resp: (!) 22 17 (!) 29   Temp:    (!) 97.5 F (36.4 C)  TempSrc:    Oral  SpO2: 92% 95% 95%    Physical Exam Vitals and nursing note reviewed.  HENT:     Head: Normocephalic.     Nose: No rhinorrhea.     Mouth/Throat:     Mouth:  Mucous membranes are moist.  Eyes:     General: No scleral icterus.    Pupils: Pupils are equal, round, and reactive to light.  Neck:     Vascular: No JVD.  Cardiovascular:     Rate and Rhythm: Normal rate and regular rhythm.  Pulmonary:     Effort: Pulmonary effort is normal.     Breath sounds: No wheezing, rhonchi or rales.  Abdominal:     General: Bowel sounds are normal. There is no distension.     Palpations: Abdomen is soft.     Tenderness: There is no abdominal tenderness. There is no guarding.  Musculoskeletal:     Cervical back: Neck supple.     Right lower leg: No edema.     Left lower leg: No edema.  Skin:    General: Skin is warm and dry.  Neurological:     General: No focal deficit present.     Mental Status: She is alert and oriented to person, place, and time.  Psychiatric:        Mood and Affect: Mood normal.        Behavior: Behavior normal.     Data Reviewed:  There are no new results to review at this time.  Echocardiogram 06/30/2022 IMPRESSIONS    1. Left ventricular ejection fraction, by estimation, is 60 to 65%. The  left ventricle has normal function. The left ventricle has no regional  wall motion abnormalities. There is moderate left ventricular hypertrophy.  Left ventricular diastolic  parameters are consistent with Grade I diastolic dysfunction (impaired  relaxation).   2. Right ventricular systolic function is moderately reduced. The right  ventricular size is normal. There is normal pulmonary artery systolic  pressure. Free wall hypokinesis with normal apical systolic function  consistent with McConnell's sign as can be   seen in acute PE   3. The mitral valve is normal in structure. No evidence of mitral valve  regurgitation. No evidence of mitral stenosis.   4. The aortic  valve is tricuspid. Aortic valve regurgitation is not  visualized. Aortic valve sclerosis is present, with no evidence of aortic  valve stenosis.   Assessment and  Plan: Principal Problem:   Acute pulmonary embolism ([place]) Observation/PCU. Supplemental oxygen as needed. Continue heparin infusion. Analgesics as needed. Consult TOC in regards to apixaban coverage.  Active Problems:   Hyperlipidemia Currently not on therapy. Lifestyle modifications. Follow-up with PCP.    Essential hypertension Continue valsartan 160 mg p.o. daily.    GERD Continue esomeprazole 20 mg p.o. daily.    Vertigo Continue meclizine as needed.    Anxiety and depression   Dementia without behavioral disturbance (HCC) Continue donezepil 10 mg p.o. daily. Continue Namenda 10 mg p.o. daily. Continue quetiapine 50 mg p.o. bedtime. Continue venlafaxine XR 150 mg p.o. daily. Follow-up with PCP and/or behavioral health as OP.    Grade I diastolic dysfunction Continue valsartan or formulary equivalent.     Advance Care Planning:   Code Status: Full Code   Consults:   Family Communication: Her daughter was at bedside.  Severity of Illness: The appropriate patient status for this patient is OBSERVATION. Observation status is judged to be reasonable and necessary in order to provide the required intensity of service to ensure the patient's safety. The patient's presenting symptoms, physical exam findings, and initial radiographic and laboratory data in the context of their medical condition is felt to place them at decreased risk for further clinical deterioration. Furthermore, it is anticipated that the patient will be medically stable for discharge from the hospital within 2 midnights of admission.   Author: Reubin Milan, MD 07/15/2022 8:10 AM  For on call review [email or web address].   This document was prepared using Dragon voice recognition software and may contain some unintended transcription errors.

## 2022-07-15 NOTE — ED Provider Notes (Signed)
[facility] DEPT Provider Note   CSN: [id number] Arrival date & time: 07/15/22  0042     History  Chief Complaint  Patient presents with   Shortness of Breath    Brenda Stephens is a 82 y.o. female.  82 y/o female with hx of HTN, breast cancer (on Tamoxifen), and hypothyroid presents to the ED for c/o chest pain and SOB. Had admission on 06/30/22 for submassive b/l pulmonary emboli with RLE DVT. Discharged on 07/05/22, but has not been able to get her Eliquis due to insurance issues. Patient states her chest pain and SOB have been worsening since discharge, more so over the past 2 days. Pain described as pressure-like and located at the L upper chest radiating to her left anterior neck. Patient does note DOE with minimal exertion. She has been having lightheadedness with an episode of near syncope today. No LOC, fevers, hemoptysis. SpO2 at home has been at or above 92%; however sats dropped down to 87% tonight with patient's near syncopal event and worsening symptoms, per daughter.    Shortness of Breath      Home Medications Prior to Admission medications   Medication Sig Start Date End Date Taking? Authorizing Provider  acetaminophen (TYLENOL) 650 MG CR tablet Take 1 tablet (650 mg total) by mouth every 8 (eight) hours as needed for pain (headaches). 07/05/22   Eugenie Filler, MD  apixaban (ELIQUIS) 5 MG TABS tablet Take 2 tablets (10 mg total) by mouth 2 (two) times daily for 5 days, THEN 2 tablets (10 mg total) 2 (two) times daily. 07/05/22 09/08/22  Eugenie Filler, MD  Biotin 10000 MCG TABS Take 10,000 mcg by mouth daily.    [provider]  cetirizine (ZYRTEC) 10 MG tablet Take 10 mg by mouth at bedtime.    [provider]  donepezil (ARICEPT) 10 MG tablet Take 10 mg by mouth at bedtime. 12/27/21   [provider]  esomeprazole (NEXIUM) 20 MG capsule Take 40 mg by mouth daily.    [provider]  hydrOXYzine  (ATARAX) 25 MG tablet Take 1 tablet (25 mg total) by mouth every 6 (six) hours as needed for anxiety. 07/05/22   Eugenie Filler, MD  meclizine (ANTIVERT) 25 MG tablet Take 1 tablet (25 mg total) by mouth 3 (three) times daily as needed for dizziness. Patient taking differently: Take 25 mg by mouth as needed for dizziness or nausea. 09/20/21   Lupita Dawn, MD  memantine (NAMENDA) 10 MG tablet Take 10 mg by mouth 2 (two) times daily. 12/27/21   [provider]  nitroGLYCERIN (NITROSTAT) 0.4 MG SL tablet Place 1 tablet (0.4 mg total) under the tongue every 5 (five) minutes as needed for chest pain. 07/05/22   Eugenie Filler, MD  Olopatadine HCl (PATADAY OP) Place 1 drop into both eyes daily.    [provider]  oxybutynin (DITROPAN) 5 MG tablet Take 5 mg by mouth 2 (two) times daily.    [provider]  Polyethyl Glycol-Propyl Glycol (SYSTANE OP) Place 1 drop into both eyes in the morning and at bedtime.    [provider]  QUEtiapine (SEROQUEL) 50 MG tablet Take 50 mg by mouth at bedtime. 04/22/22   [provider]  sennosides-docusate sodium (SENOKOT-S) 8.6-50 MG tablet Take 1 tablet by mouth daily as needed for constipation.    [provider]  tamsulosin (FLOMAX) 0.4 MG CAPS capsule Take 0.4 mg by mouth daily.  [provider]  valsartan (DIOVAN) 160 MG tablet Take 160 mg by mouth daily. 04/04/22   [provider]  venlafaxine XR (EFFEXOR-XR) 150 MG 24 hr capsule TAKE 1 CAPSULE (150 MG TOTAL) BY MOUTH DAILY. Patient taking differently: Take 150 mg by mouth daily. 12/14/12   Hendricks Limes, MD      Allergies    Latex, Sulfa antibiotics, Sulfamethoxazole-trimethoprim, Aspirin, Nsaids, and Oxycodone-acetaminophen    Review of Systems   Review of Systems  Respiratory:  Positive for shortness of breath.   Ten systems reviewed and are negative for acute change, except as noted in the HPI.    Physical Exam Updated Vital  Signs BP 112/71   Pulse 88   Temp (!) 97.5 F (36.4 C) (Oral)   Resp (!) 29   SpO2 95%   Physical Exam Vitals and nursing note reviewed.  Constitutional:      General: She is not in acute distress.    Appearance: She is well-developed. She is not diaphoretic.     Comments: Ill appearing, nontoxic female.  HENT:     Head: Normocephalic and atraumatic.  Eyes:     General: No scleral icterus.    Conjunctiva/sclera: Conjunctivae normal.  Cardiovascular:     Rate and Rhythm: Regular rhythm. Tachycardia present.     Pulses: Normal pulses.  Pulmonary:     Effort: Pulmonary effort is normal. No respiratory distress.     Breath sounds: No stridor.     Comments: SpO2 at 91-92% on room air. Mild tachypnea without respiratory distress. Speaking in full sentences. Musculoskeletal:        General: Normal range of motion.     Cervical back: Normal range of motion.     Comments: Trace RLE edema.  Skin:    General: Skin is warm and dry.     Coloration: Skin is not pale.     Findings: No erythema or rash.  Neurological:     Mental Status: She is alert and oriented to person, place, and time.     Coordination: Coordination normal.  Psychiatric:        Behavior: Behavior normal.     ED Results / Procedures / Treatments   Labs (all labs ordered are listed, but only abnormal results are displayed) Labs Reviewed  BASIC METABOLIC PANEL - Abnormal; Notable for the following components:      Result Value   Potassium 5.2 (*)    CO2 20 (*)    Glucose, Bld 151 (*)    Creatinine, Ser 1.42 (*)    GFR, Estimated 37 (*)    All other components within normal limits  CBC WITH DIFFERENTIAL/PLATELET - Abnormal; Notable for the following components:   Hemoglobin 11.8 (*)    All other components within normal limits  POTASSIUM - Abnormal; Notable for the following components:   Potassium 5.4 (*)    All other components within normal limits  TROPONIN I (HIGH SENSITIVITY) - Abnormal; Notable for  the following components:   Troponin I (High Sensitivity) 31 (*)    All other components within normal limits  PROTIME-INR  CBC WITH DIFFERENTIAL/PLATELET  HEPARIN LEVEL (UNFRACTIONATED)  COMPREHENSIVE METABOLIC PANEL  MAGNESIUM  TROPONIN I (HIGH SENSITIVITY)    EKG EKG Interpretation  Date/Time:  Tuesday July 15 2022 00:56:36 EST Ventricular Rate:  110 PR Interval:  165 QRS Duration: 92 QT Interval:  342 QTC Calculation: 463 R Axis:   -10 Text Interpretation: Sinus tachycardia Multiform ventricular premature complexes Low  voltage, precordial leads Borderline T abnormalities, diffuse leads Confirmed by Quintella Reichert [phone]) on 07/15/2022 2:25:19 AM  Radiology DG Chest Port 1 View  Result Date: 07/15/2022 CLINICAL DATA:  SOB. Bilateral PE two weeks ago, shortness of breath tonight EXAM: PORTABLE CHEST 1 VIEW COMPARISON:  Chest x-ray 06/30/2022, CT angio chest 02/25/2022, CT angio chest 06/30/2022 FINDINGS: The heart and mediastinal contours are unchanged with persistent prominent hilar vasculature. Atherosclerotic plaque. No focal consolidation. No pulmonary edema. No pleural effusion. No pneumothorax. No acute osseous abnormality. 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 IMPRESSION: 1. Persistent pulmonary venous congestion. 2.  Aortic Atherosclerosis (ICD10-I70.0). Electronically Signed   By: Iven Finn M.D.   On: 07/15/2022 01:32    Procedures .Critical Care  Performed by: Antonietta Breach, PA-C Authorized by: Antonietta Breach, PA-C   Critical care provider statement:    Critical care time (minutes):  45   Critical care time was exclusive of:  Separately billable procedures and treating other patients   Critical care was necessary to treat or prevent imminent or life-threatening deterioration of the following conditions:  Respiratory failure   Critical care was time spent personally by me on the following activities:  Development of treatment plan  with patient or surrogate, discussions with consultants, evaluation of patient's response to treatment, examination of patient, ordering and review of laboratory studies, ordering and review of radiographic studies, ordering and performing treatments and interventions, pulse oximetry, re-evaluation of patient's condition, review of old charts and obtaining history from patient or surrogate   Care discussed with: admitting provider       Medications Ordered in ED Medications  heparin ADULT infusion 100 units/mL (25000 units/234m) (1,200 Units/hr Intravenous New Bag/Given 07/15/22 0157)  acetaminophen (TYLENOL) tablet 650 mg (has no administration in time range)    Or  acetaminophen (TYLENOL) suppository 650 mg (has no administration in time range)  melatonin tablet 3 mg (3 mg Oral Given 07/15/22 0332)  heparin bolus via infusion 2,000 Units (2,000 Units Intravenous Bolus from Bag 07/15/22 0200)    ED Course/ Medical Decision Making/ A&P Clinical Course as of 07/15/22 01308 Tue Jul 15, 2022  0128 Spoke with daughter regarding patient's symptoms and progression since discharge. [KH]  0226 Based on vitals in triage and hx of hypoxia at home, prior to arrival, PESI score is 150 c/w very high mortality risk. She has been restarted on IV heparin and we will consult hospitalist for admission. Troponin appears improved in comparison to recent hospitalization. [KH]  [phone] with Dr. HVelia Meyerwho will admit. [KH]    Clinical Course User Index [KH] HAntonietta Breach PA-C                             Medical Decision Making Amount and/or Complexity of Data Reviewed Labs: ordered. Radiology: ordered.  Risk Prescription drug management. Decision regarding hospitalization.   This patient presents to the ED for concern of chest pressure and SOB, this involves an extensive number of treatment options, and is a complaint that carries with it a high risk of complications and morbidity.  The differential  diagnosis includes PE vs ACS vs PTX vs pleural effusion vs symptomatic anemia   Co morbidities that complicate the patient evaluation  HTN Pulmonary embolus Breast cancer Hypothyroid   Additional history obtained:  Additional history obtained from daughter via phone call and EMS External records from outside source obtained and reviewed including CTA chest  on 06/30/22 c/w submassive b/l PEs   Lab Tests:  I Ordered, and personally interpreted labs.  The pertinent results include:  Troponin of 31 (trending down from prior admission). Creatinine 1.42 (up from 1.61)   Imaging Studies ordered:  I ordered imaging studies including CXR  I independently visualized and interpreted imaging which showed persistent pulmonary venous congestion I agree with the radiologist interpretation   Cardiac Monitoring:  The patient was maintained on a cardiac monitor.  I personally viewed and interpreted the cardiac monitored which showed an underlying rhythm of: sinus tachycardia   Medicines ordered and prescription drug management:  I ordered medication including heparin for treatment of pumonary embolism  Reevaluation of the patient after these medicines showed that the patient improved I have reviewed the patients home medicines and have made adjustments as needed   Test Considered:  Repeat CTA chest   Critical Interventions:  Re-initiation of IV anticoagulant.   Consultations Obtained:  I requested consultation with the hospitalist service and discussed lab and imaging findings as well as pertinent plan - they agree with plan for admission and will assess patient in the ED   Problem List / ED Course:  As above   Reevaluation:  After the interventions noted above, I reevaluated the patient and found that they have :improved   Social Determinants of Health:  Insured patient Good social support   Dispostion:  After consideration of the diagnostic results and the  patients response to treatment, I feel that the patent would benefit from admission for continued IV anticoagulation. Dr. Velia Meyer of [facility] to assess patient in the ED.         Final Clinical Impression(s) / ED Diagnoses Final diagnoses:  Bilateral pulmonary embolism ([place])  SOB (shortness of breath)    Rx / DC Orders ED Discharge Orders     None         Antonietta Breach, PA-C 07/15/22 2595    Quintella Reichert, MD 07/18/22 1705

## 2022-07-15 NOTE — Progress Notes (Signed)
ANTICOAGULATION CONSULT NOTE - Follow Up Consult  Pharmacy Consult for Lovenox Indication: pulmonary embolus and DVT  Allergies  Allergen Reactions   Latex Rash   Sulfa Antibiotics Anaphylaxis and Nausea Only   Sulfamethoxazole-Trimethoprim Anaphylaxis and Nausea Only   Aspirin Other (See Comments)    GI bleeding   Nsaids Other (See Comments)    GI upset    Oxycodone-Acetaminophen Nausea Only    Patient Measurements:   Heparin Dosing Weight:  70 kg  Vital Signs: Temp: 97.5 F (36.4 C) (01/16 0607) Temp Source: Oral (01/16 0607) BP: 135/77 (01/16 1200) Pulse Rate: 86 (01/16 1200)  Labs: Recent Labs    07/15/22 0109 07/15/22 0202 07/15/22 0602 07/15/22 1030  HGB  --  11.8*  --   --   HCT  --  36.4  --   --   PLT  --  223  --   --   LABPROT 13.7  --   --   --   INR 1.1  --   --   --   HEPARINUNFRC  --   --   --  <0.10*  CREATININE 1.42*  --  1.19*  --   TROPONINIHS 31*  --  41*  --      Estimated Creatinine Clearance: 39 mL/min (A) (by C-G formula based on SCr of 1.19 mg/dL (H)).   Medications:  Infusions:   heparin 1,450 Units/hr (07/15/22 1123)    Assessment: 7 yoF recently diagnosed with DVT/PE on 06/30/22, returns 1/15 with chest pain & shortness of breath.  She was discharged with prescription for Eliquis which she never been able to fill due to cost so has been taking aspirin '325mg'$ .  Pharmacy initially consulted to start IV heparin.  (Per benefits check during last admission, her copay for initial prescription is $551 due to deductible and then $93/month thereafter.  She was counseled on the cost and given a coupon for $0 copay for the first month.)  This afternoon, Pharmacy consulted to dose enoxaparin.   Baseline CBC: Hg slightly low at 11.8; pltc WNL at 223, INR 1.1    Goal of Therapy:   Monitor platelets by anticoagulation protocol: Yes   Plan:  Discontinue IV heparin and associated labs Start enoxaparin at the time of heparin  discontinuation Enoxaparin 1 mg/kg (90 mg) subq every 12 hours Monitor CBC, signs/symptoms of bleeding    Thank you for allowing pharmacy to be a part of this patient's care.  Royetta Asal, PharmD, BCPS Clinical Pharmacist [place] Please utilize Amion for appropriate phone number to reach the unit pharmacist ([place]) 07/15/2022 2:33 PM

## 2022-07-15 NOTE — ED Triage Notes (Signed)
Pt. BIB [facility] for SOB x1 day. Pt. Was admitted 2 weeks ago with bilateral PE. Pt. Was prescribed eliquis. Pt.'s daughter unable to get eliquis from the pharmacy and gave '324mg'$  of aspirin daily.

## 2022-07-15 NOTE — Progress Notes (Signed)
ANTICOAGULATION CONSULT NOTE - Follow Up Consult  Pharmacy Consult for Heparin Indication: pulmonary embolus and DVT  Allergies  Allergen Reactions   Latex Rash   Sulfa Antibiotics Anaphylaxis and Nausea Only   Sulfamethoxazole-Trimethoprim Anaphylaxis and Nausea Only   Aspirin Other (See Comments)    GI bleeding   Nsaids Other (See Comments)    GI upset    Oxycodone-Acetaminophen Nausea Only    Patient Measurements:   Heparin Dosing Weight:  70 kg  Vital Signs: Temp: 97.5 F (36.4 C) (01/16 0607) Temp Source: Oral (01/16 0607) BP: 112/71 (01/16 0545) Pulse Rate: 88 (01/16 0545)  Labs: Recent Labs    07/15/22 0109 07/15/22 0202 07/15/22 0602  HGB  --  11.8*  --   HCT  --  36.4  --   PLT  --  223  --   LABPROT 13.7  --   --   INR 1.1  --   --   CREATININE 1.42*  --  1.19*  TROPONINIHS 31*  --  41*    Estimated Creatinine Clearance: 39 mL/min (A) (by C-G formula based on SCr of 1.19 mg/dL (H)).   Medications:  Infusions:   heparin 1,200 Units/hr (07/15/22 0157)    Assessment: 23 yoF recently diagnosed with DVT/PE on 06/30/22, returns 1/15 with chest pain & shortness of breath.  She was discharged with prescription for Eliquis which she never been able to fill due to cost so has been taking aspirin '325mg'$ .  Pharmacy consulted to start IV heparin.  (Per benefits check during last admission, her copay for initial prescription is $551 due to deductible and then $93/month thereafter.  She was counseled on the cost and given a coupon for $0 copay for the first month.)   Baseline CBC: Hg slightly low at 11.8; pltc WNL at 223, INR 1.1  Today, 07/15/2022: Heparin level < 0.1, subtherapeutic on heparin 1200 units/hr CBC: Hgb low/stable at 11.8, Plt WNL No bleeding or complications reported. No IV interruptions or pauses per RN.    Goal of Therapy:  Heparin level 0.3-0.7 units/ml Monitor platelets by anticoagulation protocol: Yes   Plan:  Give heparin 2000 units  bolus IV x 1 Increase to heparin IV infusion at 1450 units/hr Heparin level 8 hours after rate change Daily heparin level and CBC Follow up long-term anticoagulation plans.     Gretta Arab PharmD, BCPS [facility] main pharmacy [phone] 07/15/2022 9:51 AM

## 2022-07-16 ENCOUNTER — Encounter (HOSPITAL_COMMUNITY): Payer: Self-pay | Admitting: Internal Medicine

## 2022-07-16 ENCOUNTER — Other Ambulatory Visit (HOSPITAL_COMMUNITY): Payer: Self-pay

## 2022-07-16 DIAGNOSIS — I2699 Other pulmonary embolism without acute cor pulmonale: Secondary | ICD-10-CM | POA: Diagnosis not present

## 2022-07-16 LAB — CBC
HCT: 39.2 % (ref 36.0–46.0)
Hemoglobin: 12.2 g/dL (ref 12.0–15.0)
MCH: 29 pg (ref 26.0–34.0)
MCHC: 31.1 g/dL (ref 30.0–36.0)
MCV: 93.3 fL (ref 80.0–100.0)
Platelets: 232 10*3/uL (ref 150–400)
RBC: 4.2 MIL/uL (ref 3.87–5.11)
RDW: 12.8 % (ref 11.5–15.5)
WBC: 9.6 10*3/uL (ref 4.0–10.5)
nRBC: 0 % (ref 0.0–0.2)

## 2022-07-16 MED ORDER — OLOPATADINE HCL 0.1 % OP SOLN: 1.0000 [drp] | Freq: Every day | OPHTHALMIC | Status: DC | PRN

## 2022-07-16 MED ORDER — HYDROXYZINE HCL 25 MG PO TABS: 25.0000 mg | ORAL_TABLET | Freq: Four times a day (QID) | ORAL | Status: DC | PRN

## 2022-07-16 MED ORDER — APIXABAN (ELIQUIS) VTE STARTER PACK (10MG AND 5MG)
ORAL_TABLET | ORAL | 0 refills | Status: DC
  Filled 2022-07-16: qty 74, 30d supply, fill #0

## 2022-07-16 MED ORDER — POLYVINYL ALCOHOL 1.4 % OP SOLN: 1.0000 [drp] | OPHTHALMIC | Status: DC | PRN

## 2022-07-16 NOTE — ED Notes (Signed)
Patient abdomen is distended and hard. She states she does not feel full. Bladder scan-23m. MD Samtani, was notified.

## 2022-07-16 NOTE — Telephone Encounter (Signed)
Patient is currently admitted

## 2022-07-16 NOTE — Progress Notes (Signed)
PROGRESS NOTE   Brenda Stephens  KGY:[id number] DOB: October 01, 1940 DOA: 07/15/2022 PCP: Jola Baptist, PA-C  Brief Narrative:  82 year old white female Right breast cancer on tamoxifen Reflux, depression, anxiety, HLD, reflux Arthritis, mild dementia Admission 1/1 through 07/05/2022 acute submassive bilateral PE and right lower extremity DVT-placed on heparin and transition to Eliquis on discharge Patient was discharged on Eliquis given prescription but was unable to fill secondary to insurance issues-and was instead on 325 of aspirin daily On 1/15 developed SOB with a pulse of 125 On admit potassium 5.2 BUNs/creatinine 18/1.4 (above baseline of 1.1) CO2 20  Admitted 2/2 acute hypoxic respiratory failure   Hospital-Problem based course  Acute pulmonary embolism noncompliant on blood thinner -Unclear if this is secondary to a cost issue versus just not picking up the medication - Have asked pharmacy to confirm that the patient can afford these meds - Keep on Lovenox weight-based until we can sort this out - Desat screen needed  HLD -Not on any directed meds for this?-Outpatient follow-up  Vertigo - Meclizine 25 3 times daily resumed, careful while on anticoagulant Anxiety and depression + dementia - Resume Atarax 25 every 6 as needed anxiety, already back on Effexor or XR 150 daily, Seroquel 50 at bedtime  Grade 1 diastolic dysfunction HTN - Continue irbesartan 150 at bedtime resume Diovan on discharge home  Dementia?  -Has resumed on Aricept and Namenda  DVT prophylaxis: Lovenox weight-based for treatment of pulmonary embolism Code Status: Presumed full Family Communication: called [name] [phone] Disposition:  Status is: Inpatient Remains inpatient appropriate because: Needs anticoagulation sorted out     Subjective: Awake coherent no distress no icterus no pallor no rales no rhonchi Chest pain seems to be resolved   Objective: Vitals:   07/16/22 0300 07/16/22  0430 07/16/22 0600 07/16/22 0614  BP: 124/68 125/76 121/80   Pulse: 93 80 79   Resp: (!) 25 (!) 21 (!) 25   Temp:    (!) 97.5 F (36.4 C)  TempSrc:    Oral  SpO2: 96% 96% 98%     Intake/Output Summary (Last 24 hours) at 07/16/2022 0263 Last data filed at 07/15/2022 1516 Gross per 24 hour  Intake 207.15 ml  Output --  Net 207.15 ml   There were no vitals filed for this visit.  Examination:  EOMI NCAT no focal deficit Thick neck Mallampati 2 S1-S2 no murmur Abdomen obese nondistended no rebound no guarding ROM intact CTAB no added sound  Data Reviewed: personally reviewed   CBC    Component Value Date/Time   WBC 9.6 07/16/2022 0430   RBC 4.20 07/16/2022 0430   HGB 12.2 07/16/2022 0430   HGB 13.0 06/11/2021 1533   HGB 12.5 07/26/2019 1548   HGB 13.1 03/13/2017 1252   HCT 39.2 07/16/2022 0430   HCT 36.9 07/26/2019 1548   HCT 38.3 03/13/2017 1252   PLT 232 07/16/2022 0430   PLT 235 06/11/2021 1533   PLT 234 07/26/2019 1548   MCV 93.3 07/16/2022 0430   MCV 92 07/26/2019 1548   MCV 91.1 03/13/2017 1252   MCH 29.0 07/16/2022 0430   MCHC 31.1 07/16/2022 0430   RDW 12.8 07/16/2022 0430   RDW 12.6 07/26/2019 1548   RDW 12.3 03/13/2017 1252   LYMPHSABS 1.1 07/15/2022 0202   LYMPHSABS 1.4 03/13/2017 1252   MONOABS 0.7 07/15/2022 0202   MONOABS 0.7 03/13/2017 1252   EOSABS 0.1 07/15/2022 0202   EOSABS 0.2 03/13/2017 1252   BASOSABS 0.1 07/15/2022  0202   BASOSABS 0.1 03/13/2017 1252      Latest Ref Rng & Units 07/15/2022    6:02 AM 07/15/2022    2:02 AM 07/15/2022    1:09 AM  CMP  Glucose 70 - 99 mg/dL 134   151   BUN 8 - 23 mg/dL 18   18   Creatinine 0.44 - 1.00 mg/dL 1.19   1.42   Sodium 135 - 145 mmol/L 138   136   Potassium 3.5 - 5.1 mmol/L 4.4  5.4  5.2   Chloride 98 - 111 mmol/L 109   105   CO2 22 - 32 mmol/L 20   20   Calcium 8.9 - 10.3 mg/dL 9.0   9.3   Total Protein 6.5 - 8.1 g/dL 6.1     Total Bilirubin 0.3 - 1.2 mg/dL 0.4     Alkaline Phos 38 - 126  U/L 35     AST 15 - 41 U/L 21     ALT 0 - 44 U/L 18        Radiology Studies: DG Chest Port 1 View  Result Date: 07/15/2022 CLINICAL DATA:  SOB. Bilateral PE two weeks ago, shortness of breath tonight EXAM: PORTABLE CHEST 1 VIEW COMPARISON:  Chest x-ray 06/30/2022, CT angio chest 02/25/2022, CT angio chest 06/30/2022 FINDINGS: The heart and mediastinal contours are unchanged with persistent prominent hilar vasculature. Atherosclerotic plaque. No focal consolidation. No pulmonary edema. No pleural effusion. No pneumothorax. No acute osseous abnormality. 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 IMPRESSION: 1. Persistent pulmonary venous congestion. 2.  Aortic Atherosclerosis (ICD10-I70.0). Electronically Signed   By: Iven Finn M.D.   On: 07/15/2022 01:32     Scheduled Meds:  donepezil  10 mg Oral QHS   enoxaparin (LOVENOX) injection  90 mg Subcutaneous BID   irbesartan  150 mg Oral QHS   loratadine  10 mg Oral Daily   memantine  10 mg Oral BID   olopatadine  1 drop Both Eyes Daily   oxybutynin  5 mg Oral BID   pantoprazole  40 mg Oral Daily   QUEtiapine  50 mg Oral QHS   tamsulosin  0.4 mg Oral Daily   venlafaxine XR  150 mg Oral Q breakfast   Continuous Infusions:   LOS: 1 day   Time spent: 74  Nita Sells, MD [facility] To contact the attending provider between 7A-7P or the covering provider during after hours 7P-7A, please log into the web site [email or web address] and access using universal [place] password for that web site. If you do not have the password, please call the hospital operator.  07/16/2022, 7:04 AM

## 2022-07-17 ENCOUNTER — Other Ambulatory Visit (HOSPITAL_COMMUNITY): Payer: Self-pay

## 2022-07-17 DIAGNOSIS — I2699 Other pulmonary embolism without acute cor pulmonale: Secondary | ICD-10-CM | POA: Diagnosis not present

## 2022-07-17 LAB — CBC
HCT: 38.1 % (ref 36.0–46.0)
Hemoglobin: 12.2 g/dL (ref 12.0–15.0)
MCH: 30 pg (ref 26.0–34.0)
MCHC: 32 g/dL (ref 30.0–36.0)
MCV: 93.6 fL (ref 80.0–100.0)
Platelets: 223 10*3/uL (ref 150–400)
RBC: 4.07 MIL/uL (ref 3.87–5.11)
RDW: 12.5 % (ref 11.5–15.5)
WBC: 8.2 10*3/uL (ref 4.0–10.5)
nRBC: 0 % (ref 0.0–0.2)

## 2022-07-17 LAB — COMPREHENSIVE METABOLIC PANEL
ALT: 19 U/L (ref 0–44)
AST: 24 U/L (ref 15–41)
Albumin: 3.3 g/dL — ABNORMAL LOW (ref 3.5–5.0)
Alkaline Phosphatase: 37 U/L — ABNORMAL LOW (ref 38–126)
Anion gap: 10 (ref 5–15)
BUN: 22 mg/dL (ref 8–23)
CO2: 21 mmol/L — ABNORMAL LOW (ref 22–32)
Calcium: 8.7 mg/dL — ABNORMAL LOW (ref 8.9–10.3)
Chloride: 105 mmol/L (ref 98–111)
Creatinine, Ser: 1.1 mg/dL — ABNORMAL HIGH (ref 0.44–1.00)
GFR, Estimated: 51 mL/min — ABNORMAL LOW (ref >60)
Glucose, Bld: 120 mg/dL — ABNORMAL HIGH (ref 70–99)
Potassium: 4.2 mmol/L (ref 3.5–5.1)
Sodium: 136 mmol/L (ref 135–145)
Total Bilirubin: 0.7 mg/dL (ref 0.3–1.2)
Total Protein: 6.7 g/dL (ref 6.5–8.1)

## 2022-07-17 MED ORDER — APIXABAN 5 MG PO TABS
5.0000 mg | ORAL_TABLET | Freq: Two times a day (BID) | ORAL | 1 refills | Status: DC
  Filled 2022-07-17 – 2022-08-18 (×2): qty 60, 30d supply, fill #0

## 2022-07-17 NOTE — Progress Notes (Addendum)
This CSW has followed pt for O2 recommendation contingent upon PT eval per MD's request. This CSW noted no O2 is needed at this time per PT. This CSW has noted that PT recommended HH PT. This CSW noted that the pt is already connected to [facility] through [facility]. This CSW verified with Kerry Dory at [facility] that they will continue to follow pt post-discharge. Calvin requested new [place] orders.  This CSW noted that the MD has put in a DME order for 3 in 1 [facility]. This CSW attempted to contact the pt's daughter, [name], without success. TOC will attempt to contact again.

## 2022-07-17 NOTE — Discharge Summary (Signed)
Physician Discharge Summary  AKOSUA CONSTANTINE ZOX:[id number] DOB: February 21, 1941 DOA: 07/15/2022  PCP: Jola Baptist, PA-C  Admit date: 07/15/2022 Discharge date: 07/17/2022  Time spent: 36 minutes  Recommendations for Outpatient Follow-up:  Obtain CBC Chem-12 in 1 week New medication fill of Eliquis twice daily expected duration at least 3 to 6 months given bilateral PE and DVT Please follow-up in the outpatient setting with oncologist with regards to right breast cancer   Discharge Diagnoses:  MAIN problem for hospitalization   Acute DVT/PE noncompliant on meds Vertigo Stable grade 1 diastolic dysfunction  Please see below for itemized issues addressed in [place]- refer to other progress notes for clarity if needed  Discharge Condition: Improved  Diet recommendation: Heart healthy  Filed Weights   07/16/22 1533  Weight: 85 kg    History of present illness:  82 year old white female Right breast cancer on tamoxifen Reflux, depression, anxiety, HLD, reflux Arthritis, mild dementia Admission 1/1 through 07/05/2022 acute submassive bilateral PE and right lower extremity DVT-placed on heparin and transition to Eliquis on discharge Patient was discharged on Eliquis given prescription but was unable to fill secondary to insurance issues-and was instead on 325 of aspirin daily On 1/15 developed SOB with a pulse of 125 On admit potassium 5.2 BUNs/creatinine 18/1.4 (above baseline of 1.1) CO2 20   Admitted 2/2 acute hypoxic respiratory failure  Hospital Course:  Acute pulmonary embolism noncompliant on blood thinner - This was an affordability issue and patient was having a large co-pay - This has been sorted out and meds have been sent to the [facility] and patient was given a voucher for the same HLD -Not on any directed meds for this?-Outpatient follow-up   Vertigo - Meclizine 25 3 times daily resumed, careful while on anticoagulant  Anxiety and  depression + dementia - Resume Atarax 25 every 6 as needed anxiety, ffexor or XR 150 daily, Seroquel 50 at bedtime   Grade 1 diastolic dysfunction-stable HTN - Continue irbesartan 150 at bedtime resume Diovan on discharge home   Dementia?  -Has resumed on Aricept and Namenda  Discharge Exam: Vitals:   07/17/22 0600 07/17/22 0730  BP: (!) 146/67   Pulse: 81   Resp: 19   Temp:  98.2 F (36.8 C)  SpO2: 96%     Subj on day of d/c    Awake coherent no distress EOMI NCAT no focal deficit chest is clear no added sound no rales no rhonchi no wheeze Abdomen soft no rebound no guarding ROM intact Neuro intact   Discharge Instructions   Discharge Instructions     Diet - low sodium heart healthy   Complete by: As directed    Discharge instructions   Complete by: As directed    Make sure that you take the Eliquis as directed and you will need to continue this at least for 3 months as you were diagnosed previously with both blood clots in your lungs as well as a blood clot in your legs and this likely is what caused you to have pain in your chest as well as shortness of breath and discomfort Please notify someone if you have dark stool tarry stool or bleeding from anywhere It is felt that you are stable for discharge but you will need labs in about a week's time on discharge   Increase activity slowly   Complete by: As directed       Allergies as of 07/17/2022       Reactions  Latex Rash   Sulfa Antibiotics Anaphylaxis, Nausea Only   Aspirin Other (See Comments)   GI bleeding   Nsaids Other (See Comments)   GI upset    Oxycodone-acetaminophen Nausea Only        Medication List     STOP taking these medications    apixaban 5 MG Tabs tablet Commonly known as: ELIQUIS Replaced by: Apixaban Starter Pack ('10mg'$  and '5mg'$ )   aspirin EC 81 MG tablet       TAKE these medications    acetaminophen 650 MG CR tablet Commonly known as: TYLENOL Take 1 tablet (650 mg  total) by mouth every 8 (eight) hours as needed for pain (headaches). What changed:  how much to take reasons to take this   albuterol 108 (90 Base) MCG/ACT inhaler Commonly known as: VENTOLIN HFA Inhale 2 puffs into the lungs every 6 (six) hours as needed for wheezing or shortness of breath.   Apixaban Starter Pack ('10mg'$  and '5mg'$ ) Commonly known as: ELIQUIS STARTER PACK Take as directed on package: start with two-'5mg'$  tablets twice daily for 7 days. On day 8, switch to one-'5mg'$  tablet twice daily. Replaces: apixaban 5 MG Tabs tablet   apixaban 5 MG Tabs tablet Commonly known as: ELIQUIS Take 1 tablet (5 mg total) by mouth 2 (two) times daily. Start taking on: August 13, 2022   Biotin 10000 MCG Tabs Take 10,000 mcg by mouth daily.   cetirizine 10 MG tablet Commonly known as: ZYRTEC Take 10 mg by mouth at bedtime.   donepezil 10 MG tablet Commonly known as: ARICEPT Take 10 mg by mouth at bedtime.   esomeprazole 20 MG capsule Commonly known as: NEXIUM Take 40 mg by mouth daily.   NexIUM 24HR 20 MG capsule Generic drug: esomeprazole Take 40 mg by mouth daily before breakfast.   hydrOXYzine 25 MG tablet Commonly known as: ATARAX Take 1 tablet (25 mg total) by mouth every 6 (six) hours as needed for anxiety.   meclizine 25 MG tablet Commonly known as: ANTIVERT Take 1 tablet (25 mg total) by mouth 3 (three) times daily as needed for dizziness. What changed: reasons to take this   memantine 10 MG tablet Commonly known as: NAMENDA Take 10 mg by mouth 2 (two) times daily.   nitroGLYCERIN 0.4 MG SL tablet Commonly known as: NITROSTAT Place 1 tablet (0.4 mg total) under the tongue every 5 (five) minutes as needed for chest pain.   oxybutynin 5 MG tablet Commonly known as: DITROPAN Take 5 mg by mouth 2 (two) times daily.   PATADAY OP Place 1 drop into both eyes daily as needed (for allergies).   QUEtiapine 50 MG tablet Commonly known as: SEROQUEL Take 50 mg by mouth  at bedtime.   Restasis 0.05 % ophthalmic emulsion Generic drug: cycloSPORINE Place 1 drop into both eyes 2 (two) times daily.   sennosides-docusate sodium 8.6-50 MG tablet Commonly known as: SENOKOT-S Take 1 tablet by mouth daily as needed for constipation.   SYSTANE OP Place 1 drop into both eyes 2 (two) times daily as needed (for dryness).   tamsulosin 0.4 MG Caps capsule Commonly known as: FLOMAX Take 0.4 mg by mouth at bedtime.   valsartan 160 MG tablet Commonly known as: DIOVAN Take 160 mg by mouth daily.   venlafaxine XR 150 MG 24 hr capsule Commonly known as: EFFEXOR-XR TAKE 1 CAPSULE (150 MG TOTAL) BY MOUTH DAILY.       Allergies  Allergen Reactions   Latex Rash  Sulfa Antibiotics Anaphylaxis and Nausea Only   Aspirin Other (See Comments)    GI bleeding   Nsaids Other (See Comments)    GI upset    Oxycodone-Acetaminophen Nausea Only      The results of significant diagnostics from this hospitalization (including imaging, microbiology, ancillary and laboratory) are listed below for reference.    Significant Diagnostic Studies: DG Chest Port 1 View  Result Date: 07/15/2022 CLINICAL DATA:  SOB. Bilateral PE two weeks ago, shortness of breath tonight EXAM: PORTABLE CHEST 1 VIEW COMPARISON:  Chest x-ray 06/30/2022, CT angio chest 02/25/2022, CT angio chest 06/30/2022 FINDINGS: The heart and mediastinal contours are unchanged with persistent prominent hilar vasculature. Atherosclerotic plaque. No focal consolidation. No pulmonary edema. No pleural effusion. No pneumothorax. No acute osseous abnormality. 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 IMPRESSION: 1. Persistent pulmonary venous congestion. 2.  Aortic Atherosclerosis (ICD10-I70.0). Electronically Signed   By: Iven Finn M.D.   On: 07/15/2022 01:32   VAS [place] LOWER EXTREMITY VENOUS (DVT) (ONLY [facility] & [facility])  Result Date: 07/01/2022  Lower Venous DVT Study Patient Name:  JEANNIA TATRO  Date of Exam:   06/30/2022 Medical Rec #: [id number]        Accession #:    [id number] Date of Birth: 01/21/1941        Patient Gender: F Patient Age:   82 years Exam Location:  [facility] Procedure:      VAS [place] LOWER EXTREMITY VENOUS (DVT) Referring Phys: Erin Hearing --------------------------------------------------------------------------------  Indications: Pulmonary embolism.  Limitations: Poor ultrasound/tissue interface. Comparison Study: No previous exams Performing Technologist: Jody Hill RVT, RDMS  Examination Guidelines: A complete evaluation includes B-mode imaging, spectral Doppler, color Doppler, and power Doppler as needed of all accessible portions of each vessel. Bilateral testing is considered an integral part of a complete examination. Limited examinations for reoccurring indications may be performed as noted. The reflux portion of the exam is performed with the patient in reverse Trendelenburg.  +---------+---------------+---------+-----------+----------+-------------------+ RIGHT    CompressibilityPhasicitySpontaneityPropertiesThrombus Aging      +---------+---------------+---------+-----------+----------+-------------------+ CFV      Full           Yes      Yes                                      +---------+---------------+---------+-----------+----------+-------------------+ SFJ      Full                                                             +---------+---------------+---------+-----------+----------+-------------------+ FV Prox  Full           Yes      Yes                                      +---------+---------------+---------+-----------+----------+-------------------+ FV Mid   Full           Yes      Yes                                      +---------+---------------+---------+-----------+----------+-------------------+  FV DistalFull           Yes      Yes                                       +---------+---------------+---------+-----------+----------+-------------------+ PFV      Full                                                             +---------+---------------+---------+-----------+----------+-------------------+ POP      None           No       No                   Acute               +---------+---------------+---------+-----------+----------+-------------------+ PTV      Full                                                             +---------+---------------+---------+-----------+----------+-------------------+ PERO                                                  Not well visualized +---------+---------------+---------+-----------+----------+-------------------+   +---------+---------------+---------+-----------+----------+-------------------+ LEFT     CompressibilityPhasicitySpontaneityPropertiesThrombus Aging      +---------+---------------+---------+-----------+----------+-------------------+ CFV      Full           Yes      Yes                                      +---------+---------------+---------+-----------+----------+-------------------+ SFJ      Full                                                             +---------+---------------+---------+-----------+----------+-------------------+ FV Prox  Full           Yes      Yes                                      +---------+---------------+---------+-----------+----------+-------------------+ FV Mid   Full           Yes      Yes                                      +---------+---------------+---------+-----------+----------+-------------------+ FV DistalFull           Yes      Yes                                      +---------+---------------+---------+-----------+----------+-------------------+  PFV      Full                                                             +---------+---------------+---------+-----------+----------+-------------------+  POP      Full                                                             +---------+---------------+---------+-----------+----------+-------------------+ PTV                                                   Not well visualized +---------+---------------+---------+-----------+----------+-------------------+ PERO     Full                                                             +---------+---------------+---------+-----------+----------+-------------------+     Summary: BILATERAL: -No evidence of popliteal cyst, bilaterally. RIGHT: - Findings consistent with acute deep vein thrombosis involving the right popliteal vein.  LEFT: - There is no evidence of deep vein thrombosis in the lower extremity.  *See table(s) above for measurements and observations. Electronically signed by Deitra Mayo MD on 07/01/2022 at 3:47:54 PM.    Final    US RENAL  Result Date: 07/01/2022 CLINICAL DATA:  82 year old female with elevated serum creatinine. EXAM: RENAL / URINARY TRACT ULTRASOUND COMPLETE COMPARISON:  09/11/2021 CT. FINDINGS: Right Kidney: Renal measurements: 9.5 x 5.3 x 5.7 cm = volume: 149 mL. Echogenicity within normal limits. Cortical thinning is noted. There is no evidence of hydronephrosis or solid mass. Left Kidney: Renal measurements: 10.3 x 4.6 x 4.7 cm = volume: 116 mL. Echogenicity is within normal limits. Cortical thinning is noted. There is no evidence of hydronephrosis or solid renal mass. Bladder: Appears normal for degree of bladder distention. Other: None. IMPRESSION: 1. Bilateral renal cortical thinning. Renal echogenicity within normal limits. 2. No evidence of hydronephrosis or solid renal mass. Electronically Signed   By: Margarette Canada M.D.   On: 07/01/2022 10:07   ECHOCARDIOGRAM COMPLETE  Result Date: 06/30/2022    ECHOCARDIOGRAM REPORT   Patient Name:   ADEENA BERNABE Date of Exam: 06/30/2022 Medical Rec #:  [id number]       Height:       63.0 in Accession #:    [id number]       Weight:       185.0 lb Date of Birth:  [birth year]       BSA:          1.871 m Patient Age:    59 years        BP:           166/91 mmHg Patient Gender: F               HR:  104 bpm. Exam Location:  Inpatient Procedure: 2D Echo, Cardiac Doppler and Color Doppler Indications:    Pulmonary Embolus I26.09  History:        Patient has no prior history of Echocardiogram examinations.                 Signs/Symptoms:Dyspnea, Chest Pain and Fatigue; Risk                 Factors:Dyslipidemia.  Sonographer:    Luane School RDCS Referring Phys: [id number] Samella Parr  Sonographer Comments: Technically difficult study due to poor echo windows. Image acquisition challenging due to respiratory motion and supine position. IMPRESSIONS  1. Left ventricular ejection fraction, by estimation, is 60 to 65%. The left ventricle has normal function. The left ventricle has no regional wall motion abnormalities. There is moderate left ventricular hypertrophy. Left ventricular diastolic parameters are consistent with Grade I diastolic dysfunction (impaired relaxation).  2. Right ventricular systolic function is moderately reduced. The right ventricular size is normal. There is normal pulmonary artery systolic pressure. Free wall hypokinesis with normal apical systolic function consistent with McConnell's sign as can be  seen in acute PE  3. The mitral valve is normal in structure. No evidence of mitral valve regurgitation. No evidence of mitral stenosis.  4. The aortic valve is tricuspid. Aortic valve regurgitation is not visualized. Aortic valve sclerosis is present, with no evidence of aortic valve stenosis. FINDINGS  Left Ventricle: Left ventricular ejection fraction, by estimation, is 60 to 65%. The left ventricle has normal function. The left ventricle has no regional wall motion abnormalities. The left ventricular internal cavity size was small. There is moderate  left ventricular hypertrophy. Left ventricular diastolic  parameters are consistent with Grade I diastolic dysfunction (impaired relaxation). Right Ventricle: The right ventricular size is normal. Right vetricular wall thickness was not well visualized. Right ventricular systolic function is moderately reduced. There is normal pulmonary artery systolic pressure. The tricuspid regurgitant velocity is 2.78 m/s, and with an assumed right atrial pressure of 3 mmHg, the estimated right ventricular systolic pressure is 03.4 mmHg. Left Atrium: Left atrial size was normal in size. Right Atrium: Right atrial size was normal in size. Pericardium: Trivial pericardial effusion is present. Presence of epicardial fat layer. Mitral Valve: The mitral valve is normal in structure. No evidence of mitral valve regurgitation. No evidence of mitral valve stenosis. Tricuspid Valve: The tricuspid valve is normal in structure. Tricuspid valve regurgitation is trivial. Aortic Valve: The aortic valve is tricuspid. Aortic valve regurgitation is not visualized. Aortic valve sclerosis is present, with no evidence of aortic valve stenosis. Pulmonic Valve: The pulmonic valve was not well visualized. Pulmonic valve regurgitation is trivial. Aorta: The aortic root and ascending aorta are structurally normal, with no evidence of dilitation. IAS/Shunts: The interatrial septum was not well visualized.  LEFT VENTRICLE PLAX 2D LVIDd:         3.40 cm   Diastology LVIDs:         2.70 cm   LV e' medial:    4.43 cm/s LV PW:         1.50 cm   LV E/e' medial:  9.7 LV IVS:        1.50 cm   LV e' lateral:   3.98 cm/s LVOT diam:     2.00 cm   LV E/e' lateral: 10.8 LV SV:         44 LV SV Index:   24 LVOT Area:  3.14 cm  RIGHT VENTRICLE            IVC RV S prime:     8.81 cm/s  IVC diam: 1.50 cm TAPSE (M-mode): 1.8 cm LEFT ATRIUM           Index        RIGHT ATRIUM           Index LA diam:      3.40 cm 1.82 cm/m   RA Area:     10.10 cm LA Vol (A4C): 45.3 ml 24.22 ml/m  RA Volume:   19.40 ml  10.37 ml/m  AORTIC  VALVE             PULMONIC VALVE LVOT Vmax:   93.73 cm/s  PR End Diast Vel: 7.62 msec LVOT Vmean:  67.233 cm/s LVOT VTI:    0.141 m  AORTA Ao Root diam: 3.00 cm Ao Asc diam:  3.00 cm Ao Desc diam: 1.90 cm MITRAL VALVE               TRICUSPID VALVE MV Area (PHT): 4.57 cm    TR Peak grad:   30.9 mmHg MV Decel Time: 166 msec    TR Vmax:        278.00 cm/s MV E velocity: 42.80 cm/s MV A velocity: 78.00 cm/s  SHUNTS MV E/A ratio:  0.55        Systemic VTI:  0.14 m                            Systemic Diam: 2.00 cm Oswaldo Milian MD Electronically signed by Oswaldo Milian MD Signature Date/Time: 06/30/2022/1:49:26 PM    Final    DG Abd Portable 2 Views  Result Date: 06/30/2022 CLINICAL DATA:  82 year old female with history of abdominal distension and nausea. EXAM: PORTABLE ABDOMEN - 2 VIEW COMPARISON:  No priors. FINDINGS: Gas and stool are seen scattered throughout the colon extending to the level of the distal rectum. No pathologic distension of small bowel is noted. No gross evidence of pneumoperitoneum. Iodinated contrast material filling the lumen of the urinary bladder. IMPRESSION: 1. Nonobstructive bowel gas pattern. 2. No pneumoperitoneum. Electronically Signed   By: Vinnie Langton M.D.   On: 06/30/2022 10:37   CT Angio Chest PE W and/or Wo Contrast  Result Date: 06/30/2022 CLINICAL DATA:  Shortness of breath for the past week, worsening today. EXAM: CT ANGIOGRAPHY CHEST WITH CONTRAST TECHNIQUE: Multidetector CT imaging of the chest was performed using the standard protocol during bolus administration of intravenous contrast. Multiplanar CT image reconstructions and MIPs were obtained to evaluate the vascular anatomy. RADIATION DOSE REDUCTION: This exam was performed according to the departmental dose-optimization program which includes automated exposure control, adjustment of the mA and/or kV according to patient size and/or use of iterative reconstruction technique. CONTRAST:  177m OMNIPAQUE  IOHEXOL 350 MG/ML SOLN COMPARISON:  Portable chest today, chest radiograph 09/20/2021, CTA chest 02/25/2022. FINDINGS: Cardiovascular: There is mild cardiomegaly with a right chamber predominance and RV/LV ratio of 1.31 indicating right heart strain. The pulmonary trunk is 3 cm and both main pulmonary arteries approaching 3 cm indicating arterial hypertension. There are bilateral near-occlusive clots, almost certainly acute, in both distal main pulmonary arteries with extension into the bilateral lobar main arteries, segmental arteries and a few downstream subsegmental arterial branches with overall large clot burden. There is no pericardial effusion. There is scattered calcification in the left main and LAD  coronary artery. There is aortic atherosclerosis and tortuosity without aneurysm or dissection. The great vessels are not well seen due to metal spray artifact from bilateral shoulder replacements but they are grossly patent at least proximally. Pulmonary veins are decompressed. Mediastinum/Nodes: No enlarged mediastinal, hilar, or axillary lymph nodes. Thyroid gland, trachea, and esophagus demonstrate no significant findings. Surgical clips are again noted in the right axilla Lungs/Pleura: Difficult to evaluate due to abundant respiratory motion artifacts. There are trace pleural effusions. There is a calcified right lower lobe granuloma laterally. Scattered linear scar-like opacities both bases. No confluent dense infiltrate is seen. There is no appreciable noncalcified nodule. No pleural thickening or pneumothorax. Upper Abdomen: Hepatic steatosis. No acute abnormality accounting for respiratory motion. Moderate pancreatic atrophy. Musculoskeletal: There is osteopenia, thoracic kyphosis and advanced degenerative change of the thoracic spine. 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 The ribcage is intact. Review of the MIP images confirms the above findings.  IMPRESSION: 1. Positive for acute PE with CT evidence of right heart strain (RV/LV Ratio = 1.31 ) consistent with at least submassive (intermediate risk) PE. The presence of right heart strain has been associated with an increased risk of morbidity and mortality. Please refer to the "PE Focused" order set in EPIC. 2. Aortic and coronary artery atherosclerosis. 3. Trace pleural effusions. 4. Hepatic steatosis. 5. Results phoned to Dr. Leonette Monarch in the ED at 3:03 a.m., 06/30/2022, with verbal acknowledgement of the key findings. Aortic Atherosclerosis (ICD10-I70.0). Electronically Signed   By: Telford Nab M.D.   On: 06/30/2022 03:15   DG Chest Port 1 View  Result Date: 06/30/2022 CLINICAL DATA:  Shortness of breath x1 week. EXAM: PORTABLE CHEST 1 VIEW COMPARISON:  September 20, 2021 FINDINGS: The heart size and mediastinal contours are within normal limits. Low lung volumes are noted. Both lungs are clear. Radiopaque surgical clips are seen along the lateral aspect of the right chest wall. Bilateral shoulder replacements are seen. Multilevel degenerative changes are noted throughout the thoracic spine. IMPRESSION: Low lung volumes without active cardiopulmonary disease. Electronically Signed   By: Virgina Norfolk M.D.   On: 06/30/2022 00:55    Microbiology: No results found for this or any previous visit (from the past 240 hour(s)).   Labs: Basic Metabolic Panel: Recent Labs  Lab 07/15/22 0109 07/15/22 0202 07/15/22 0602  NA 136  --  138  K 5.2* 5.4* 4.4  CL 105  --  109  CO2 20*  --  20*  GLUCOSE 151*  --  134*  BUN 18  --  18  CREATININE 1.42*  --  1.19*  CALCIUM 9.3  --  9.0  MG  --   --  1.9   Liver Function Tests: Recent Labs  Lab 07/15/22 0602  AST 21  ALT 18  ALKPHOS 35*  BILITOT 0.4  PROT 6.1*  ALBUMIN 3.1*   No results for input(s): "LIPASE", "AMYLASE" in the last 168 hours. No results for input(s): "AMMONIA" in the last 168 hours. CBC: Recent Labs  Lab 07/15/22 0202  07/16/22 0430 07/17/22 0735  WBC 9.3 9.6 8.2  NEUTROABS 7.3  --   --   HGB 11.8* 12.2 12.2  HCT 36.4 39.2 38.1  MCV 93.3 93.3 93.6  PLT 223 232 223   Cardiac Enzymes: No results for input(s): "CKTOTAL", "CKMB", "CKMBINDEX", "TROPONINI" in the last 168 hours. BNP: BNP (last 3 results) Recent Labs    06/30/22 0021  BNP 284.4*    ProBNP (  last 3 results) Recent Labs    02/21/22 1021  PROBNP 35.0    CBG: No results for input(s): "GLUCAP" in the last 168 hours.     Signed:  Nita Sells MD   [facility] 07/17/2022, 8:06 AM

## 2022-07-17 NOTE — Evaluation (Signed)
Physical Therapy Evaluation Patient Details Name: Brenda Stephens MRN: [id number] DOB: 1941/06/23 Today's Date: 07/17/2022  History of Present Illness  82 y/o female with hx of HTN, breast cancer (on Tamoxifen), and hypothyroid presents to the ED for c/o chest pain and SOB. Had admission on 06/30/22 for submassive b/l pulmonary emboli with RLE DVT. Discharged on 07/05/22, but has not been able to get her Eliquis due to insurance issues. Patient states her chest pain and SOB have been worsening since discharge, more so over the past 2 days. Pain described as pressure-like and located at the L upper chest radiating to her left anterior neck. Patient does note DOE with minimal exertion.  Clinical Impression  The patient appears weak and fatigued. Patient ambulate  d 10' x 2 on RA with SPO2 100%. Patient  with noted 3/4 dyspnea but recovered quickly. Patient's daughter parest, whom is available to assist patient. HHPT already on board. Patient to DC home today.  No further PT.   Recommendations for follow up therapy are one component of a multi-disciplinary discharge planning process, led by the attending physician.  Recommendations may be updated based on patient status, additional functional criteria and insurance authorization.  Follow Up Recommendations Home health PT      Assistance Recommended at Discharge Set up Supervision/Assistance  Patient can return home with the following  Assistance with cooking/housework;Help with stairs or ramp for entrance;A little help with walking and/or transfers;Assist for transportation;A little help with bathing/dressing/bathroom    Equipment Recommendations None recommended by PT  Recommendations for Other Services       Functional Status Assessment Patient has not had a recent decline in their functional status     Precautions / Restrictions Precautions Precaution Comments: check sats Restrictions Weight Bearing Restrictions: No      Mobility   Bed Mobility Overal bed mobility: Needs Assistance Bed Mobility: Supine to Sit, Sit to Supine     Supine to sit: Min guard Sit to supine: Min guard   General bed mobility comments: extra time on high surface    Transfers   Equipment used: Rolling walker (2 wheels) Transfers: Sit to/from Stand Sit to Stand: Min guard           General transfer comment: min guard fro stretcher and BSc    Ambulation/Gait   Gait Distance (Feet): 5 Feet (10' x 2) Assistive device: Rolling walker (2 wheels) Gait Pattern/deviations: Step-through pattern, Decreased stride length       General Gait Details: slow speed  Stairs            Wheelchair Mobility    Modified Rankin (Stroke Patients Only)       Balance Overall balance assessment: History of Falls, Mild deficits observed, not formally tested                                           Pertinent Vitals/Pain Pain Assessment Pain Assessment: Faces Faces Pain Scale: Hurts a little bit Pain Location: Left upper chest Pain Descriptors / Indicators: Heaviness, Discomfort Pain Intervention(s): Monitored during session    Home Living Family/patient expects to be discharged to:: Private residence Living Arrangements: Children Available Help at Discharge: Family;Available 24 hours/day Type of Home: House Home Access: Stairs to enter   [employer] of Steps: 2 Alternate Level Stairs-Number of Steps: stairs, landing, stairs Home Layout: Bed/bath upstairs;Two level;1/2 bath on main  level Home Equipment: Conservation officer, nature (2 wheels);Shower seat;Hand held shower head;Grab bars - toilet;Grab bars - tub/shower      Prior Function Prior Level of Function : Needs assist       Physical Assist : Mobility (physical) Mobility (physical): Transfers;Gait   Mobility Comments: since Dc  post PE/DVT, required assistance for amb with RW, ADLs Comments: dtr assisted     Hand Dominance   Dominant Hand:  Right    Extremity/Trunk Assessment   Upper Extremity Assessment Upper Extremity Assessment: Overall WFL for tasks assessed    Lower Extremity Assessment Lower Extremity Assessment: Generalized weakness    Cervical / Trunk Assessment Cervical / Trunk Assessment: Normal  Communication   Communication: No difficulties  Cognition Arousal/Alertness: Awake/alert Behavior During Therapy: WFL for tasks assessed/performed Overall Cognitive Status: Within Functional Limits for tasks assessed                                 General Comments: slow to respond,        General Comments      Exercises     Assessment/Plan    PT Assessment All further PT needs can be met in the next venue of care  PT Problem List Decreased activity tolerance;Decreased strength;Decreased mobility;Decreased knowledge of use of DME       PT Treatment Interventions Gait training;DME instruction;Therapeutic exercise;Balance training;Functional mobility training;Therapeutic activities;Patient/family education;Stair training    PT Goals (Current goals can be found in the Care Plan section)  Acute Rehab PT Goals PT Goal Formulation: All assessment and education complete, DC therapy    Frequency       Co-evaluation               AM-PAC PT "6 Clicks" Mobility  Outcome Measure Help needed turning from your back to your side while in a flat bed without using bedrails?: A Little Help needed moving from lying on your back to sitting on the side of a flat bed without using bedrails?: A Little Help needed moving to and from a bed to a chair (including a wheelchair)?: A Little Help needed standing up from a chair using your arms (e.g., wheelchair or bedside chair)?: A Little Help needed to walk in hospital room?: A Little Help needed climbing 3-5 steps with a railing? : A Lot 6 Click Score: 17    End of Session   Activity Tolerance: Patient limited by fatigue;Patient tolerated  treatment well Patient left: in bed;with family/visitor present Nurse Communication: Mobility status PT Visit Diagnosis: Difficulty in walking, not elsewhere classified (R26.2)    Time: 1235-1310 PT Time Calculation (min) (ACUTE ONLY): 35 min   Charges:   PT Evaluation $PT Eval Low Complexity: 1 Low PT Treatments $Gait Training: 8-22 mins        [place] Office [phone] Weekend pager-[phone]   Claretha Cooper 07/17/2022, 1:27 PM

## 2022-07-17 NOTE — Care Management (Addendum)
This RNCM called and left voicemail message for patient's daughter regarding DME, awaiting call back.   TOC will continue to follow.  - 3:40p This RNCM spoke with patient and daughter [name] at bedside. This RNCM offered choice for DME:3N1, patient and family chose [employer].  This RNCM notified [employer] who will deliver DME to patient's home address on file. [employer] contact info added to AVS.  Patient's daughter [name] reported patient will have a follow up appt w/Dr.Stein on Thursday at [facility] also reports patient has [facility], HHPT/OT services already and plan to continue with [facility]. This RNCM advised that Calvin with [facility] has been notified. Patient's daughter reports Eliquis has been delivered to bedside.  No additional TOC needs at this time.

## 2022-07-22 NOTE — Progress Notes (Deleted)
Cardiology Clinic Note   Patient Name: Brenda Stephens Date of Encounter: 07/22/2022  Primary Care Provider:  Jola Baptist, PA-C Primary Cardiologist:  [name], MD  Patient Profile    Brenda Stephens 82 year old female presents to the clinic today for follow-up evaluation of her essential hypertension and unstable angina.  Past Medical History    Past Medical History:  Diagnosis Date   Allergy    latex   Anemia    Anxiety    Arthritis    Cancer ([place]) 01/2017   right breast cancer   Depression    Dyslipidemia    Eustachian tube dysfunction    GERD (gastroesophageal reflux disease)    Grade I diastolic dysfunction [id number]   Hx of cardiovascular stress test    Myoview 10/13:  apical thinning, EF 72%, no ischemia   Hx of colonic polyps    Dr Watt Climes   Hypertension    Hypothyroidism    Other abnormal glucose    Past Surgical History:  Procedure Laterality Date   ABDOMINAL HYSTERECTOMY     BSO for uterine fibroids   ACHILLES TENDON SURGERY Right 10/07/2012   Procedure: RIGHT REPAIR RUPTURE TENDON PRIMARY OPEN/PERCUTANEOUS ;  Surgeon: Ninetta Lights, MD;  Location: [place];  Service: Orthopedics;  Laterality: Right;   APPENDECTOMY     BREAST LUMPECTOMY WITH RADIOACTIVE SEED AND SENTINEL LYMPH NODE BIOPSY Right 03/16/2017   Procedure: RIGHT BREAST LUMPECTOMY WITH RADIOACTIVE SEED AND RIGHT AXILLARY SENTINEL  NODE BIOPSY ERAS PATHWAY;  Surgeon: [name], MD;  Location: [place];  Service: General;  Laterality: Right;  PECTORAL BLOCK   CATARACT EXTRACTION, BILATERAL     CHOLECYSTECTOMY     COLONOSCOPY W/ POLYPECTOMY     Dr Watt Climes   EAR CYST EXCISION Right 10/07/2012   Procedure: EXCISION BONE CYST BENIGN TUMOR [name] ;  Surgeon: Ninetta Lights, MD;  Location: [place];  Service: Orthopedics;  Laterality: Right;   EXPLORATORY LAPAROTOMY     LEFT HEART CATH AND CORONARY ANGIOGRAPHY N/A  08/02/2019   Procedure: LEFT HEART CATH AND CORONARY ANGIOGRAPHY;  Surgeon: Martinique, Peter M, MD;  Location: [place] [facility];  Service: Cardiovascular;  Laterality: N/A;   REVISION TOTAL SHOULDER TO REVERSE TOTAL SHOULDER Left 01/12/2019   Procedure: REVISION TOTAL SHOULDER TO REVERSE TOTAL SHOULDER;  Surgeon: Hiram Gash, MD;  Location: [facility];  Service: Orthopedics;  Laterality: Left;   SINUS SURGERY WITH INSTATRAK     TOTAL SHOULDER REPLACEMENT     Right shoulder   TOTAL SHOULDER REPLACEMENT  05/2010   Left   TUBAL LIGATION      Allergies  Allergies  Allergen Reactions   Latex Rash   Sulfa Antibiotics Anaphylaxis and Nausea Only   Aspirin Other (See Comments)    GI bleeding   Nsaids Other (See Comments)    GI upset    Oxycodone-Acetaminophen Nausea Only    History of Present Illness    [name] has a PMH of diastolic CHF, breast cancer, palpitations, vertigo, lower extremity swelling, chest pain of uncertain etiology, umbilical hernia, chronic fatigue, anemia, HLD, HTN, dementia, unstable angina, pulmonary embolus, and HTN.   She was previously seen by Dr. Percival Spanish for evaluation of palpitations in 2017.  She was seen again in 2019 for lower extremity swelling.  She was noted to have chest discomfort and had normal coronary anatomy in 2021.  She was seen in follow-up  by Dr. Percival Spanish on 02/28/2022.  During that time she reported dyspnea with exertion which was chronic.  Very sedentary due to joint pain and dyspnea.  Moved from her chair to her bed and the house very little.  She was noted to have mild lower extremity swelling which was unchanged.  Her CT was reviewed which showed aortic atherosclerosis and coronary artery atherosclerosis.  Her aorta was noted to be 40 mm.  She denied orthopnea and PND.  She did note palpitations but denied presyncope and syncope.  Her history was complicated by her dementia.  She continued to receive care by/from her daughter.  She was  admitted to the hospital on 07/15/2022 and discharged on 07/17/2022.  She was admitted 06/30/2022 through 07/05/2022 with acute submassive bilateral PE and right lower extremity DVT.  At that time she was placed on heparin and transition to Eliquis.  Due to insurance because she was unable to fill the Eliquis and was instead on 325 of aspirin daily.  On 07/14/2022 she developed shortness of breath and her pulse was noted to be 125.  She was admitted with a potassium of 5.2.  Her medications were sorted out and sent to [facility] outpatient pharmacy.  She was also given a voucher the same day.  Her Aricept and Namenda were resumed.  She was given instructions to continue her apixaban for at least 3 months as well as bleeding precautions.  She presents to the clinic today for follow-up evaluation and states***  *** denies chest pain, shortness of breath, lower extremity edema, fatigue, palpitations, melena, hematuria, hemoptysis, diaphoresis, weakness, presyncope, syncope, orthopnea, and PND.  Bilateral PE-daughters report compliance with apixaban.  They deny bleeding issues. Continue apixaban Heart healthy low-sodium diet-salty 6 given Increase physical activity as tolerated  Palpitations-heart rate today***. Maintain hydration Avoid triggers caffeine, chocolate, EtOH, dehydration etc.  Essential hypertension-BP today*** Continue losartan Heart healthy low-sodium diet-salty 6 given Increase physical activity as tolerated  Disposition: Follow-up with Dr. Percival Spanish or Almyra Deforest PA-C in 3-4 months. Home Medications    Prior to Admission medications   Medication Sig Start Date End Date Taking? Authorizing Provider  acetaminophen (TYLENOL) 650 MG CR tablet Take 1 tablet (650 mg total) by mouth every 8 (eight) hours as needed for pain (headaches). Patient taking differently: Take 1,300 mg by mouth every 8 (eight) hours as needed for pain (or headaches). 07/05/22   Eugenie Filler, MD  albuterol  (VENTOLIN HFA) 108 (90 Base) MCG/ACT inhaler Inhale 2 puffs into the lungs every 6 (six) hours as needed for wheezing or shortness of breath.    [provider]  apixaban (ELIQUIS) 5 MG TABS tablet Take 1 tablet (5 mg total) by mouth 2 (two) times daily. 08/13/22   Nita Sells, MD  APIXABAN Arne [place]) VTE STARTER PACK ('10MG'$  AND '5MG'$ ) Take as directed on package: start with two-'5mg'$  tablets twice daily for 7 days. On day 8, switch to one-'5mg'$  tablet twice daily. 07/16/22   Nita Sells, MD  Biotin 10000 MCG TABS Take 10,000 mcg by mouth daily.    [provider]  cetirizine (ZYRTEC) 10 MG tablet Take 10 mg by mouth at bedtime.    [provider]  donepezil (ARICEPT) 10 MG tablet Take 10 mg by mouth at bedtime. 12/27/21   [provider]  esomeprazole (NEXIUM) 20 MG capsule Take 40 mg by mouth daily. Patient not taking: Reported on 07/16/2022    [provider]  hydrOXYzine (ATARAX) 25 MG tablet Take  1 tablet (25 mg total) by mouth every 6 (six) hours as needed for anxiety. Patient not taking: Reported on 07/16/2022 07/05/22   Eugenie Filler, MD  meclizine (ANTIVERT) 25 MG tablet Take 1 tablet (25 mg total) by mouth 3 (three) times daily as needed for dizziness. Patient taking differently: Take 25 mg by mouth 3 (three) times daily as needed for dizziness or nausea. 09/20/21   Lupita Dawn, MD  memantine (NAMENDA) 10 MG tablet Take 10 mg by mouth 2 (two) times daily. 12/27/21   [provider]  NEXIUM 24HR 20 MG capsule Take 40 mg by mouth daily before breakfast.    [provider]  nitroGLYCERIN (NITROSTAT) 0.4 MG SL tablet Place 1 tablet (0.4 mg total) under the tongue every 5 (five) minutes as needed for chest pain. 07/05/22   Eugenie Filler, MD  Olopatadine HCl (PATADAY OP) Place 1 drop into both eyes daily as needed (for allergies).    [provider]  oxybutynin (DITROPAN) 5 MG tablet Take 5 mg by mouth 2 (two) times  daily.    [provider]  Polyethyl Glycol-Propyl Glycol (SYSTANE OP) Place 1 drop into both eyes 2 (two) times daily as needed (for dryness).    [provider]  QUEtiapine (SEROQUEL) 50 MG tablet Take 50 mg by mouth at bedtime. 04/22/22   [provider]  RESTASIS 0.05 % ophthalmic emulsion Place 1 drop into both eyes 2 (two) times daily.    [provider]  sennosides-docusate sodium (SENOKOT-S) 8.6-50 MG tablet Take 1 tablet by mouth daily as needed for constipation.    [provider]  tamsulosin (FLOMAX) 0.4 MG CAPS capsule Take 0.4 mg by mouth at bedtime.    [provider]  valsartan (DIOVAN) 160 MG tablet Take 160 mg by mouth daily. 04/04/22   [provider]  venlafaxine XR (EFFEXOR-XR) 150 MG 24 hr capsule TAKE 1 CAPSULE (150 MG TOTAL) BY MOUTH DAILY. Patient taking differently: Take 150 mg by mouth daily. 12/14/12   Hendricks Limes, MD    Family History    Family History  Problem Relation Age of Onset   Hiatal hernia Mother    Heart attack Mother 59   Heart disease Father 39       CAD @ autopsy   Cancer Father        renal cancer   Heart attack Brother 62       fatal   Diabetes Neg Hx    Stroke Neg Hx    She indicated that her mother is deceased. She indicated that her father is deceased. She indicated that her brother is deceased. She indicated that the status of her neg hx is unknown.  Social History    Social History   Socioeconomic History   Marital status: Widowed    Spouse name: Not on file   Number of children: 1   Years of education: Not on file   Highest education level: Not on file  Occupational History   Not on file  Tobacco Use   Smoking status: Former    Packs/day: 0.50    Years: 6.00    Total pack years: 3.00    Types: Cigarettes    Quit date: 06/30/1962    Years since quitting: 60.1   Smokeless tobacco: Never   Tobacco comments:    Quit age 46  Vaping Use   Vaping Use: Never  used  Substance and Sexual Activity   Alcohol use:  Yes    Alcohol/week: 3.0 standard drinks of alcohol    Types: 3 Glasses of wine per week    Comment: wine  : < 3 glasses weekly   Drug use: No   Sexual activity: Yes    Birth control/protection: Post-menopausal, Surgical  Other Topics Concern   Not on file  Social History Narrative   Lives husband and daughter lives with her.    Social Determinants of Health   Financial Resource Strain: Not on file  Food Insecurity: No Food Insecurity (07/01/2022)   Hunger Vital Sign    Worried About Running Out of Food in the Last Year: Never true    Ran Out of Food in the Last Year: Never true  Transportation Needs: No Transportation Needs (07/01/2022)   PRAPARE - Hydrologist (Medical): No    Lack of Transportation (Non-Medical): No  Physical Activity: Not on file  Stress: Not on file  Social Connections: Not on file  Intimate Partner Violence: Not At Risk (07/01/2022)   Humiliation, Afraid, Rape, and Kick questionnaire    Fear of Current or Ex-Partner: No    Emotionally Abused: No    Physically Abused: No    Sexually Abused: No     Review of Systems    General:  No chills, fever, night sweats or weight changes.  Cardiovascular:  No chest pain, dyspnea on exertion, edema, orthopnea, palpitations, paroxysmal nocturnal dyspnea. Dermatological: No rash, lesions/masses Respiratory: No cough, dyspnea Urologic: No hematuria, dysuria Abdominal:   No nausea, vomiting, diarrhea, bright red blood per rectum, melena, or hematemesis Neurologic:  No visual changes, wkns, changes in mental status. All other systems reviewed and are otherwise negative except as noted above.  Physical Exam    VS:  There were no vitals taken for this visit. , BMI There is no height or weight on file to calculate BMI. GEN: Well nourished, well developed, in no acute distress. HEENT: normal. Neck: Supple, no JVD, carotid bruits, or  masses. Cardiac: RRR, no murmurs, rubs, or gallops. No clubbing, cyanosis, edema.  Radials/DP/PT 2+ and equal bilaterally.  Respiratory:  Respirations regular and unlabored, clear to auscultation bilaterally. GI: Soft, nontender, nondistended, BS + x 4. MS: no deformity or atrophy. Skin: warm and dry, no rash. Neuro:  Strength and sensation are intact. Psych: Normal affect.  Accessory Clinical Findings    Recent Labs: 02/21/2022: Pro B Natriuretic peptide (BNP) 35.0 06/30/2022: B Natriuretic Peptide 284.4 07/15/2022: Magnesium 1.9 07/17/2022: ALT 19; BUN 22; Creatinine, Ser 1.10; Hemoglobin 12.2; Platelets 223; Potassium 4.2; Sodium 136   Recent Lipid Panel    Component Value Date/Time   CHOL 208 (H) 07/19/2012 1520   TRIG 152.0 (H) 07/19/2012 1520   HDL 70.30 07/19/2012 1520   CHOLHDL 3 07/19/2012 1520   VLDL 30.4 07/19/2012 1520   LDLCALC 91 06/13/2011 1134   LDLDIRECT 108.4 07/19/2012 1520    No BP recorded.  {Refresh Note OR Click here to enter BP  :1}***    ECG personally reviewed by me today- *** - No acute changes  Echocardiogram 06/30/2022  IMPRESSIONS     1. Left ventricular ejection fraction, by estimation, is 60 to 65%. The  left ventricle has normal function. The left ventricle has no regional  wall motion abnormalities. There is moderate left ventricular hypertrophy.  Left ventricular diastolic  parameters are consistent with Grade I diastolic dysfunction (impaired  relaxation).   2. Right ventricular systolic function is moderately reduced. The right  ventricular size is normal. There is normal pulmonary artery systolic  pressure. Free wall hypokinesis with normal apical systolic function  consistent with McConnell's sign as can be   seen in acute PE   3. The mitral valve is normal in structure. No evidence of mitral valve  regurgitation. No evidence of mitral stenosis.   4. The aortic valve is tricuspid. Aortic valve regurgitation is not  visualized.  Aortic valve sclerosis is present, with no evidence of aortic  valve stenosis.   FINDINGS   Left Ventricle: Left ventricular ejection fraction, by estimation, is 60  to 65%. The left ventricle has normal function. The left ventricle has no  regional wall motion abnormalities. The left ventricular internal cavity  size was small. There is moderate   left ventricular hypertrophy. Left ventricular diastolic parameters are  consistent with Grade I diastolic dysfunction (impaired relaxation).   Right Ventricle: The right ventricular size is normal. Right vetricular  wall thickness was not well visualized. Right ventricular systolic  function is moderately reduced. There is normal pulmonary artery systolic  pressure. The tricuspid regurgitant  velocity is 2.78 m/s, and with an assumed right atrial pressure of 3 mmHg,  the estimated right ventricular systolic pressure is [id number] mmHg.   Left Atrium: Left atrial size was normal in size.   Right Atrium: Right atrial size was normal in size.   Pericardium: Trivial pericardial effusion is present. Presence of  epicardial fat layer.   Mitral Valve: The mitral valve is normal in structure. No evidence of  mitral valve regurgitation. No evidence of mitral valve stenosis.   Tricuspid Valve: The tricuspid valve is normal in structure. Tricuspid  valve regurgitation is trivial.   Aortic Valve: The aortic valve is tricuspid. Aortic valve regurgitation is  not visualized. Aortic valve sclerosis is present, with no evidence of  aortic valve stenosis.   Pulmonic Valve: The pulmonic valve was not well visualized. Pulmonic valve  regurgitation is trivial.   Aorta: The aortic root and ascending aorta are structurally normal, with  no evidence of dilitation.   IAS/Shunts: The interatrial septum was not well visualized.    Assessment & Plan   1.  ***   Jossie Ng. Edmund Holcomb NP-C     07/22/2022, 7:05 AM [address] Office [phone] Fax [phone]    I spent***minutes examining this patient, reviewing medications, and using patient centered shared decision making involving her cardiac care.  Prior to her visit I spent greater than 20 minutes reviewing her past medical history,  medications, and prior cardiac tests.

## 2022-07-23 ENCOUNTER — Ambulatory Visit: Payer: MEDICARE | Admitting: General Practice

## 2022-07-24 ENCOUNTER — Inpatient Hospital Stay: Payer: MEDICARE | Admitting: Hematology and Oncology

## 2022-07-24 ENCOUNTER — Encounter: Payer: Self-pay | Admitting: Hematology and Oncology

## 2022-07-26 NOTE — Progress Notes (Deleted)
Cardiology Office Note:    Date:  07/26/2022   ID:  Brenda Stephens, DOB Mar 22, 1941, MRN [id number]  PCP:  Jola Baptist, PA-C  Cardiologist:  [name], MD  Electrophysiologist:  None   Referring MD: Jola Baptist, PA-C   Chief Complaint: hospital follow-up of acute PE  History of Present Illness:    Brenda Stephens is a 82 y.o. female with a history of normal coronaries on cardiac catheterization in 08/2019, palpitations with infrequent PACs/PVCs noted on monitor in 12/2020, aortic root enlargement, recent PE (diagnosed on 06/30/2022) on Eliquis, hypertension, dyslipidemia, hypothyroidism, GERD, and dementia who is followed by Dr. Percival Spanish and presents today for hospital follow-up of acute PE.  Patient has a long history of chest pain and has had multiple nuclear stress test of the years. He ultimately underwent a cardiac catheterization in 08/2019 which showed normal coronaries. Monitor was ordered in 12/2020 for further evaluation of palpitations and showed normal sinus rhythm with infrequent PACs/PVCs.but no significant arrhythmias.  Patient was last seen by Dr. Percival Spanish in 02/2022 at which time she reported dyspnea on exertion but this was a chronic issues. She reported living a very sedentary lifestyle and stated she did not move much because of joint pains and dyspnea.   Patient has had two recent hospitalizations. She was admitted from 06/30/2022 to 07/05/2022 for acute bilateral Pes after presenting with worsening shortness of breath. Chest CTA showed bilateral near occlusive clots in both distal main pulmonary arteries with extension into the bilateral lobar main arteries, segmental arteries, and a few downstream subsegmental arterial branches. There was evidence of right heart strain consistent with a least submassive PE. Lower extremity dopplers showed acute DVT of right popliteal vein. Echo showed LVEF of 60-65% with normal wall motion, moderate LVH, and grade 1 diastolic  dysfunction as well as moderately reduced RV systolic function and positive McConnell sign. Patient was treated with IV Heparin for 72 hours and then transitioned to Eliquis. Troponin was mildly elevated and peaked at 220 consistent with demand ischemia from acute PE. Patient was discharged on Eliquis but was unable to get it filled secondary to insurance issues and was taking Aspirin '325mg'$  daily instead. She was readmitted from 07/15/2022 to 07/17/2022 for acute hypoxic respiratory failure secondary to acute PE after presenting with shortness of breath and elevated heart rate. Insurance/cost issues with Eliquis were worked out and she was restarted on this.   Patient presents today for follow-up. ***  Acute PE Patient was diagnosed with acute bilateral PE with evidence of right heart strain on CTA on 06/30/2022. Echo showed LVEF of 60-65% with normal wall motion, moderate LVH, and grade 1 diastolic dysfunction as well as moderately reduced RV systolic function and positive McConnell sign. Patient was started on Eliquis. - *** - Continue treatment dose of Eliquis.  - Patient was referred to [place]. Will defer duration of anticoagulation to PCP or HemOnc.  History of Palpitations Monitor in 12/2020 showed infrequent PACs/PVCs. - Stable.   Hypertension BP *** - Continue Valsartan '160mg'$  daily.   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 However, this was not mentioned on CTA or Echo in 06/2022.   Past Medical History:  Diagnosis Date   Allergy    latex   Anemia    Anxiety    Arthritis    Cancer ([place]) 01/2017   right breast cancer   Depression  Dyslipidemia    Eustachian tube dysfunction    GERD (gastroesophageal reflux disease)    Grade I diastolic dysfunction 16/03/9603   Hx of cardiovascular stress test    Myoview 10/13:  apical thinning, EF 72%, no ischemia   Hx of colonic polyps    Dr Watt Climes    Hypertension    Hypothyroidism    Other abnormal glucose     Past Surgical History:  Procedure Laterality Date   ABDOMINAL HYSTERECTOMY     BSO for uterine fibroids   ACHILLES TENDON SURGERY Right 10/07/2012   Procedure: RIGHT REPAIR RUPTURE TENDON PRIMARY OPEN/PERCUTANEOUS ;  Surgeon: Ninetta Lights, MD;  Location: [place];  Service: Orthopedics;  Laterality: Right;   APPENDECTOMY     BREAST LUMPECTOMY WITH RADIOACTIVE SEED AND SENTINEL LYMPH NODE BIOPSY Right 03/16/2017   Procedure: RIGHT BREAST LUMPECTOMY WITH RADIOACTIVE SEED AND RIGHT AXILLARY SENTINEL  NODE BIOPSY ERAS PATHWAY;  Surgeon: [name], MD;  Location: [place];  Service: General;  Laterality: Right;  PECTORAL BLOCK   CATARACT EXTRACTION, BILATERAL     CHOLECYSTECTOMY     COLONOSCOPY W/ POLYPECTOMY     Dr Watt Climes   EAR CYST EXCISION Right 10/07/2012   Procedure: EXCISION BONE CYST BENIGN TUMOR [name] ;  Surgeon: Ninetta Lights, MD;  Location[place];  Service: Orthopedics;  Laterality: Right;   EXPLORATORY LAPAROTOMY     LEFT HEART CATH AND CORONARY ANGIOGRAPHY N/A 08/02/2019   Procedure: LEFT HEART CATH AND CORONARY ANGIOGRAPHY;  Surgeon: Martinique, Peter M, MD;  Location: [facility];  Service: Cardiovascular;  Laterality: N/A;   REVISION TOTAL SHOULDER TO REVERSE TOTAL SHOULDER Left 01/12/2019   Procedure: REVISION TOTAL SHOULDER TO REVERSE TOTAL SHOULDER;  Surgeon: Hiram Gash, MD;  Location: [facility];  Service: Orthopedics;  Laterality: Left;   SINUS SURGERY WITH INSTATRAK     TOTAL SHOULDER REPLACEMENT     Right shoulder   TOTAL SHOULDER REPLACEMENT  05/2010   Left   TUBAL LIGATION      Current Medications: No outpatient medications have been marked as taking for the 08/06/22 encounter (Appointment) with Darreld Mclean, PA-C.     Allergies:   Latex, Sulfa antibiotics, Aspirin, Nsaids, and Oxycodone-acetaminophen   Social History    Socioeconomic History   Marital status: Widowed    Spouse name: Not on file   Number of children: 1   Years of education: Not on file   Highest education level: Not on file  Occupational History   Not on file  Tobacco Use   Smoking status: Former    Packs/day: 0.50    Years: 6.00    Total pack years: 3.00    Types: Cigarettes    Quit date: 06/30/1962    Years since quitting: 60.1   Smokeless tobacco: Never   Tobacco comments:    Quit age 31  Vaping Use   Vaping Use: Never used  Substance and Sexual Activity   Alcohol use: Yes    Alcohol/week: 3.0 standard drinks of alcohol    Types: 3 Glasses of wine per week    Comment: wine  : < 3 glasses weekly   Drug use: No   Sexual activity: Yes    Birth control/protection: Post-menopausal, Surgical  Other Topics Concern   Not on file  Social History Narrative   Lives husband and daughter lives with her.    Social Determinants of Health   Financial  Resource Strain: Not on file  Food Insecurity: No Food Insecurity (07/01/2022)   Hunger Vital Sign    Worried About Running Out of Food in the Last Year: Never true    Ran Out of Food in the Last Year: Never true  Transportation Needs: No Transportation Needs (07/01/2022)   PRAPARE - Hydrologist (Medical): No    Lack of Transportation (Non-Medical): No  Physical Activity: Not on file  Stress: Not on file  Social Connections: Not on file     Family History: The patient's family history includes Cancer in her father; Heart attack (age of onset: 16) in her brother; Heart attack (age of onset: 23) in her mother; Heart disease (age of onset: 77) in her father; Hiatal hernia in her mother. There is no history of Diabetes or Stroke.  ROS:   Please see the history of present illness.     EKGs/Labs/Other Studies Reviewed:    The following studies were reviewed:  Left Cardiac Catheterization 08/02/2019: The left ventricular systolic function is normal. LV  end diastolic pressure is normal. The left ventricular ejection fraction is 55-65% by visual estimate.   1. Normal coronary anatomy 2. Normal LV function 3. Normal LVEDP   Plan: medical management.   Diagnostic Dominance: Right  _______________  Monitor 12/2020: Normal sinus rhythm No sustained arrhythmias Infrequent ventricular ectopy and supraventricular ectopy Symptoms of chest discomfort, fluttering, fatigue did not correlate with any significant arrhythmias that occurred during normal sinus rhythm or during episodes of ectopy. _______________  Chest CTA 06/30/2022: Impressions: 1. Positive for acute PE with CT evidence of right heart strain (RV/LV Ratio = 1.31 ) consistent with at least submassive (intermediate risk) PE. The presence of right heart strain has been associated with an increased risk of morbidity and mortality. Please refer to the "PE Focused" order set in EPIC. 2. Aortic and coronary artery atherosclerosis. 3. Trace pleural effusions. 4. Hepatic steatosis. 5. Results phoned to Dr. Leonette Monarch in the ED at 3:03 a.m., 06/30/2022, with verbal acknowledgement of the key findings. _______________  Lower Extremity Doppler 06/30/2022: Summary: RIGHT:  - Findings consistent with acute deep vein thrombosis involving the right  popliteal vein.    LEFT:  - There is no evidence of deep vein thrombosis in the lower extremity.  _______________  Echocardiogram 06/30/2022: Impressions:  1. Left ventricular ejection fraction, by estimation, is 60 to 65%. The  left ventricle has normal function. The left ventricle has no regional  wall motion abnormalities. There is moderate left ventricular hypertrophy.  Left ventricular diastolic  parameters are consistent with Grade I diastolic dysfunction (impaired  relaxation).   2. Right ventricular systolic function is moderately reduced. The right  ventricular size is normal. There is normal pulmonary artery systolic  pressure.  Free wall hypokinesis with normal apical systolic function  consistent with McConnell's sign as can be   seen in acute PE   3. The mitral valve is normal in structure. No evidence of mitral valve  regurgitation. No evidence of mitral stenosis.   4. The aortic valve is tricuspid. Aortic valve regurgitation is not  visualized. Aortic valve sclerosis is present, with no evidence of aortic  valve stenosis.    EKG:  EKG not ordered today.   Recent Labs: 02/21/2022: Pro B Natriuretic peptide (BNP) 35.0 06/30/2022: B Natriuretic Peptide 284.4 07/15/2022: Magnesium 1.9 07/17/2022: ALT 19; BUN 22; Creatinine, Ser 1.10; Hemoglobin 12.2; Platelets 223; Potassium 4.2; Sodium 136  Recent Lipid Panel  Component Value Date/Time   CHOL 208 (H) 07/19/2012 1520   TRIG 152.0 (H) 07/19/2012 1520   HDL 70.30 07/19/2012 1520   CHOLHDL 3 07/19/2012 1520   VLDL 30.4 07/19/2012 1520   LDLCALC 91 06/13/2011 1134   LDLDIRECT 108.4 07/19/2012 1520    Physical Exam:    Vital Signs: There were no vitals taken for this visit.    Wt Readings from Last 3 Encounters:  07/16/22 187 lb 6.3 oz (85 kg)  07/05/22 188 lb 4.4 oz (85.4 kg)  02/28/22 185 lb 3.2 oz (84 kg)     General: 82 y.o. female in no acute distress. HEENT: Normocephalic and atraumatic. Sclera clear. EOMs intact. Neck: Supple. No carotid bruits. No JVD. Heart: *** RRR. Distinct S1 and S2. No murmurs, gallops, or rubs. Radial and distal pedal pulses 2+ and equal bilaterally. Lungs: No increased work of breathing. Clear to ausculation bilaterally. No wheezes, rhonchi, or rales.  Abdomen: Soft, non-distended, and non-tender to palpation. Bowel sounds present in all 4 quadrants.  MSK: Normal strength and tone for age. *** Extremities: No lower extremity edema.    Skin: Warm and dry. Neuro: Alert and oriented x3. No focal deficits. Psych: Normal affect. Responds appropriately.   Assessment:    No diagnosis found.  Plan:     Disposition:  Follow up in ***   Medication Adjustments/Labs and Tests Ordered: Current medicines are reviewed at length with the patient today.  Concerns regarding medicines are outlined above.  No orders of the defined types were placed in this encounter.  No orders of the defined types were placed in this encounter.   There are no Patient Instructions on file for this visit.   Signed, Darreld Mclean, PA-C  07/26/2022 11:30 AM    [facility]

## 2022-07-30 ENCOUNTER — Other Ambulatory Visit (HOSPITAL_COMMUNITY): Payer: Self-pay

## 2022-08-05 ENCOUNTER — Inpatient Hospital Stay: Payer: MEDICARE | Attending: Hematology and Oncology | Admitting: Hematology and Oncology

## 2022-08-05 NOTE — Progress Notes (Deleted)
Brenda Stephens  [phone] [phone]     ID: Brenda Stephens DOB: 19-Aug-1940  MR#: [id number]  [id number]  Patient Care Team: Brenda Stephens as PCP - General (Physician Assistant) Brenda Breeding, MD as PCP - Cardiology (Cardiology) Brenda Stephens, Brenda Dad, MD (Inactive) as Consulting Physician (Oncology) Brenda Bookbinder, MD as Consulting Physician (General Surgery) Brenda Essex, MD as Consulting Physician (Gastroenterology)  CHIEF COMPLAINT: Estrogen receptor positive breast cancer  CURRENT TREATMENT: Tamoxifen   INTERVAL HISTORY: Brenda Stephens returns today for follow-up of her estrogen receptor positive breast cancer.  She continues on tamoxifen, with good tolerance. Hot flashes and vaginal dryness are not an issue. Since her last visit, she had imaging which showed acute PE with at least submassive PE.   She missed her last appt on 07/09/2022, while she was inpatient decision was made to hold Tamoxifen.   COVID 19 VACCINATION STATUS: Status post Brenda Stephens x2, most recently February 2020; no booster as of November 2021    HISTORY OF CURRENT ILLNESS: From the original intake note:  Brenda Stephens had routine bilateral screening mammography at [facility] 01/28/2017. An irregular lesion in the right breast upper outer quadrant was detected and she was recalled for right diagnostic mammography and ultrasonography 02/05/2017. This found the breast density to be category B. In the right breast upper quadrant there was an irregular mass which by ultrasound measured 0.8 cm. It was located at the 11:00 radiant posteriorly. The right axilla was sonographically benign.  Biopsy of the right breast mass in question 02/09/2017 found ([id number]) invasive ductal carcinoma, grade 1, estrogen receptor 100% positive, progesterone receptor 20% positive, both with strong staining intensity, with an MIB-1 of 2% and no HER-2 amplification, the signals ratio being 1.19 and the number  per cell 1.90.  The patient's subsequent history is as detailed below.   PAST MEDICAL HISTORY: Past Medical History:  Diagnosis Date   Allergy    latex   Anemia    Anxiety    Arthritis    Cancer ([place]) 01/2017   right breast cancer   Depression    Dyslipidemia    Eustachian tube dysfunction    GERD (gastroesophageal reflux disease)    Grade I diastolic dysfunction [id number]   Hx of cardiovascular stress test    Myoview 10/13:  apical thinning, EF 72%, no ischemia   Hx of colonic polyps    Dr Watt Climes   Hypertension    Hypothyroidism    Other abnormal glucose     PAST SURGICAL HISTORY: Past Surgical History:  Procedure Laterality Date   ABDOMINAL HYSTERECTOMY     BSO for uterine fibroids   ACHILLES TENDON SURGERY Right 10/07/2012   Procedure: RIGHT REPAIR RUPTURE TENDON PRIMARY OPEN/PERCUTANEOUS ;  Surgeon: Brenda Lights, MD;  Location: [place];  Service: Orthopedics;  Laterality: Right;   APPENDECTOMY     BREAST LUMPECTOMY WITH RADIOACTIVE SEED AND SENTINEL LYMPH NODE BIOPSY Right 03/16/2017   Procedure: RIGHT BREAST LUMPECTOMY WITH RADIOACTIVE SEED AND RIGHT AXILLARY SENTINEL  NODE BIOPSY ERAS PATHWAY;  Surgeon: Brenda Bookbinder, MD;  Location: [place];  Service: General;  Laterality: Right;  PECTORAL BLOCK   CATARACT EXTRACTION, BILATERAL     CHOLECYSTECTOMY     COLONOSCOPY W/ POLYPECTOMY     Dr Watt Climes   EAR CYST EXCISION Right 10/07/2012   Procedure: EXCISION BONE CYST BENIGN TUMOR Brenda Stephens ;  Surgeon: Brenda Lights, MD;  Location: [place];  Service:  Orthopedics;  Laterality: Right;   EXPLORATORY LAPAROTOMY     LEFT HEART CATH AND CORONARY ANGIOGRAPHY N/A 08/02/2019   Procedure: LEFT HEART CATH AND CORONARY ANGIOGRAPHY;  Surgeon: Stephens, Brenda M, MD;  Location: [place] [facility];  Service: Cardiovascular;  Laterality: N/A;   REVISION TOTAL SHOULDER TO REVERSE TOTAL SHOULDER Left 01/12/2019   Procedure:  REVISION TOTAL SHOULDER TO REVERSE TOTAL SHOULDER;  Surgeon: Brenda Gash, MD;  Location: [facility];  Service: Orthopedics;  Laterality: Left;   SINUS SURGERY WITH INSTATRAK     TOTAL SHOULDER REPLACEMENT     Right shoulder   TOTAL SHOULDER REPLACEMENT  05/2010   Left   TUBAL LIGATION      FAMILY HISTORY Family History  Problem Relation Age of Onset   Hiatal hernia Mother    Heart attack Mother 7   Heart disease Father 85       CAD @ autopsy   Cancer Father        renal cancer   Heart attack Brother 77       fatal   Diabetes Neg Hx    Stroke Neg Hx   The patient's father died from kidney cancer at age 63. The patient's mother died from a heart attack at age 36. The patient had one brother, who died from a heart attack. She had no sisters. There is no history of breast or ovarian cancer in the family.   GYNECOLOGIC HISTORY:  No LMP recorded. Patient has had a hysterectomy. Menarche: age 72 First live birth: age 8 [place] P[facility].  Hysterectomy? Yes BSO? Yes HR: yes, for between 5 and 10 years.   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 Her husband was Mudlogger of [place] for railroad company. He retired then worked as Special educational needs teacher. He died January 30, 2020. Their daughter Brenda Stephens is an attorney but currently works as a Education officer, museum in [place]. The patient has no grandchildren. She is not a church attender    ADVANCED DIRECTIVES: The patient intends to name her daughter Brenda Stephens as her healthcare power of attorney. The appropriate papers were given to her to complete and notarized at her discretion at the 05/29/2019 visit.   HEALTH MAINTENANCE: Social History   Tobacco Use   Smoking status: Former    Packs/day: 0.50    Years: 6.00    Total pack years: 3.00    Types: Cigarettes    Quit date: 06/30/1962    Years since quitting: 60.1   Smokeless tobacco: Never   Tobacco comments:    Quit age 70  Vaping Use   Vaping Use:  Never used  Substance Use Topics   Alcohol use: Yes    Alcohol/week: 3.0 standard drinks of alcohol    Types: 3 Glasses of wine per week    Comment: wine  : < 3 glasses weekly   Drug use: No     Colonoscopy:May got  PAP: Status post hysterectomy  Bone density: Due/[facility]   Allergies  Allergen Reactions   Latex Rash   Sulfa Antibiotics Anaphylaxis and Nausea Only   Aspirin Other (See Comments)    GI bleeding   Nsaids Other (See Comments)    GI upset    Oxycodone-Acetaminophen Nausea Only    Current Outpatient Medications  Medication Sig Dispense Refill   acetaminophen (TYLENOL) 650 MG CR tablet Take 1 tablet (650 mg total) by mouth every 8 (eight) hours as needed for pain (headaches). (  Patient taking differently: Take 1,300 mg by mouth every 8 (eight) hours as needed for pain (or headaches).)     albuterol (VENTOLIN HFA) 108 (90 Base) MCG/ACT inhaler Inhale 2 puffs into the lungs every 6 (six) hours as needed for wheezing or shortness of breath.     [START ON 08/13/2022] apixaban (ELIQUIS) 5 MG TABS tablet Take 1 tablet (5 mg total) by mouth 2 (two) times daily. 60 tablet 1   APIXABAN (ELIQUIS) VTE STARTER PACK ('10MG'$  AND '5MG'$ ) Take as directed on package: start with two-'5mg'$  tablets twice daily for 7 days. On day 8, switch to one-'5mg'$  tablet twice daily. 74 each 0   Biotin 10000 MCG TABS Take 10,000 mcg by mouth daily.     cetirizine (ZYRTEC) 10 MG tablet Take 10 mg by mouth at bedtime.     donepezil (ARICEPT) 10 MG tablet Take 10 mg by mouth at bedtime.     esomeprazole (NEXIUM) 20 MG capsule Take 40 mg by mouth daily. (Patient not taking: Reported on 07/16/2022)     hydrOXYzine (ATARAX) 25 MG tablet Take 1 tablet (25 mg total) by mouth every 6 (six) hours as needed for anxiety. (Patient not taking: Reported on 07/16/2022) 20 tablet 0   meclizine (ANTIVERT) 25 MG tablet Take 1 tablet (25 mg total) by mouth 3 (three) times daily as needed for dizziness. (Patient taking differently: Take 25  mg by mouth 3 (three) times daily as needed for dizziness or nausea.) 30 tablet 0   memantine (NAMENDA) 10 MG tablet Take 10 mg by mouth 2 (two) times daily.     NEXIUM 24HR 20 MG capsule Take 40 mg by mouth daily before breakfast.     nitroGLYCERIN (NITROSTAT) 0.4 MG SL tablet Place 1 tablet (0.4 mg total) under the tongue every 5 (five) minutes as needed for chest pain. 20 tablet 2   Olopatadine HCl (PATADAY OP) Place 1 drop into both eyes daily as needed (for allergies).     oxybutynin (DITROPAN) 5 MG tablet Take 5 mg by mouth 2 (two) times daily.     Polyethyl Glycol-Propyl Glycol (SYSTANE OP) Place 1 drop into both eyes 2 (two) times daily as needed (for dryness).     QUEtiapine (SEROQUEL) 50 MG tablet Take 50 mg by mouth at bedtime.     RESTASIS 0.05 % ophthalmic emulsion Place 1 drop into both eyes 2 (two) times daily.     sennosides-docusate sodium (SENOKOT-S) 8.6-50 MG tablet Take 1 tablet by mouth daily as needed for constipation.     tamsulosin (FLOMAX) 0.4 MG CAPS capsule Take 0.4 mg by mouth at bedtime.     valsartan (DIOVAN) 160 MG tablet Take 160 mg by mouth daily.     venlafaxine XR (EFFEXOR-XR) 150 MG 24 hr capsule TAKE 1 CAPSULE (150 MG TOTAL) BY MOUTH DAILY. (Patient taking differently: Take 150 mg by mouth daily.) 90 capsule 2   No current facility-administered medications for this visit.    OBJECTIVE: White woman in no acute distress  There were no vitals filed for this visit.     There is no height or weight on file to calculate BMI.   Wt Readings from Last 3 Encounters:  07/16/22 187 lb 6.3 oz (85 kg)  07/05/22 188 lb 4.4 oz (85.4 kg)  02/28/22 185 lb 3.2 oz (84 kg)     ECOG FS:1 - Symptomatic but completely ambulatory  Sclerae unicteric, EOMs intact Wearing a mask No cervical or supraclavicular adenopathy Lungs no rales  or rhonchi Heart regular rate and rhythm Abd soft, nontender, positive bowel sounds MSK mild kyphosis but no focal spinal tenderness, no  upper extremity lymphedema Neuro: nonfocal, well oriented, appropriate affect Breasts: The right breast is unremarkable.  The left breast is status postlumpectomy and radiation with no evidence of local recurrence.  Both axillae are benign. Skin: There is a significant erythematous confluent rash in both inframammary areas   LAB RESULTS:  CMP     Component Value Date/Time   NA 136 07/17/2022 0735   NA 138 07/26/2019 1548   NA 132 (L) 03/13/2017 1252   K 4.2 07/17/2022 0735   K 4.3 03/13/2017 1252   CL 105 07/17/2022 0735   CO2 21 (L) 07/17/2022 0735   CO2 26 03/13/2017 1252   GLUCOSE 120 (H) 07/17/2022 0735   GLUCOSE 107 03/13/2017 1252   BUN 22 07/17/2022 0735   BUN 15 07/26/2019 1548   BUN 14.8 03/13/2017 1252   CREATININE 1.10 (H) 07/17/2022 0735   CREATININE 1.08 (H) 06/11/2021 1533   CREATININE 0.8 03/13/2017 1252   CALCIUM 8.7 (L) 07/17/2022 0735   CALCIUM 10.0 03/13/2017 1252   PROT 6.7 07/17/2022 0735   PROT 7.2 03/13/2017 1252   ALBUMIN 3.3 (L) 07/17/2022 0735   ALBUMIN 4.0 03/13/2017 1252   AST 24 07/17/2022 0735   AST 18 06/11/2021 1533   AST 18 03/13/2017 1252   ALT 19 07/17/2022 0735   ALT 14 06/11/2021 1533   ALT 18 03/13/2017 1252   ALKPHOS 37 (L) 07/17/2022 0735   ALKPHOS 71 03/13/2017 1252   BILITOT 0.7 07/17/2022 0735   BILITOT 0.5 06/11/2021 1533   BILITOT 0.54 03/13/2017 1252   GFRNONAA 51 (L) 07/17/2022 0735   GFRNONAA 52 (L) 06/11/2021 1533   GFRAA >60 09/10/2019 1029    Lab Results  Component Value Date   WBC 8.2 07/17/2022   NEUTROABS 7.3 07/15/2022   HGB 12.2 07/17/2022   HCT 38.1 07/17/2022   MCV 93.6 07/17/2022   PLT 223 07/17/2022      Chemistry      Component Value Date/Time   NA 136 07/17/2022 0735   NA 138 07/26/2019 1548   NA 132 (L) 03/13/2017 1252   K 4.2 07/17/2022 0735   K 4.3 03/13/2017 1252   CL 105 07/17/2022 0735   CO2 21 (L) 07/17/2022 0735   CO2 26 03/13/2017 1252   BUN 22 07/17/2022 0735   BUN 15  07/26/2019 1548   BUN 14.8 03/13/2017 1252   CREATININE 1.10 (H) 07/17/2022 0735   CREATININE 1.08 (H) 06/11/2021 1533   CREATININE 0.8 03/13/2017 1252      Component Value Date/Time   CALCIUM 8.7 (L) 07/17/2022 0735   CALCIUM 10.0 03/13/2017 1252   ALKPHOS 37 (L) 07/17/2022 0735   ALKPHOS 71 03/13/2017 1252   AST 24 07/17/2022 0735   AST 18 06/11/2021 1533   AST 18 03/13/2017 1252   ALT 19 07/17/2022 0735   ALT 14 06/11/2021 1533   ALT 18 03/13/2017 1252   BILITOT 0.7 07/17/2022 0735   BILITOT 0.5 06/11/2021 1533   BILITOT 0.54 03/13/2017 1252       No results found for: "LABCA2"  No components found for: "[id number]"  No results for input(s): "INR" in the last 168 hours.  No results found for: "LABCA2"  No results found for: "[id number]"  No results found for: "CAN125"  No results found for: "CAN153"  No results found for: "CA2729"  No components  found for: "HGQUANT"  No results found for: "CEA1", "CEA" / No results found for: "CEA1", "CEA"   No results found for: "AFPTUMOR"  No results found for: "CHROMOGRNA"  No results found for: "TOTALPROTELP", "ALBUMINELP", "A1GS", "A2GS", "BETS", "BETA2SER", "GAMS", "MSPIKE", "SPEI" (this displays SPEP labs)  No results found for: "KPAFRELGTCHN", "LAMBDASER", "KAPLAMBRATIO" (kappa/lambda light chains)  No results found for: "HGBA", "HGBA2QUANT", "HGBFQUANT", "HGBSQUAN" (Hemoglobinopathy evaluation)   No results found for: "LDH"  Lab Results  Component Value Date   IRON 59 12/18/2010   IRONPCTSAT 12.5 (L) 12/18/2010   (Iron and TIBC)  No results found for: "FERRITIN"  Urinalysis    Component Value Date/Time   COLORURINE YELLOW 06/04/2010 0845   APPEARANCEUR CLOUDY (A) 06/04/2010 0845   LABSPEC 1.011 06/04/2010 0845   PHURINE 7.5 06/04/2010 0845   GLUCOSEU NEGATIVE 06/04/2010 0845   HGBUR NEGATIVE 06/04/2010 0845   BILIRUBINUR NEGATIVE 06/04/2010 0845   KETONESUR NEGATIVE 06/04/2010 0845   PROTEINUR  NEGATIVE 06/04/2010 0845   UROBILINOGEN 0.2 06/04/2010 0845   NITRITE NEGATIVE 06/04/2010 0845   LEUKOCYTESUR LARGE (A) 06/04/2010 0845    STUDIES: DG Chest Port 1 View  Result Date: 07/15/2022 CLINICAL DATA:  SOB. Bilateral PE two weeks ago, shortness of breath tonight EXAM: PORTABLE CHEST 1 VIEW COMPARISON:  Chest x-ray 06/30/2022, CT angio chest 02/25/2022, CT angio chest 06/30/2022 FINDINGS: The heart and mediastinal contours are unchanged with persistent prominent hilar vasculature. Atherosclerotic plaque. No focal consolidation. No pulmonary edema. No pleural effusion. No pneumothorax. No acute osseous abnormality. 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 IMPRESSION: 1. Persistent pulmonary venous congestion. 2.  Aortic Atherosclerosis (ICD10-I70.0). Electronically Signed   By: Iven Finn M.D.   On: 07/15/2022 01:32     ELIGIBLE FOR AVAILABLE RESEARCH PROTOCOL: no  ASSESSMENT: 82 y.o. [place] woman status post left breast upper outer quadrant biopsy 02/09/2017 for a clinical T1b N0, stage IA invasive ductal carcinoma, grade 1, estrogen and progesterone receptor positive, HER-2 nonamplified, with an MIB-1 of 2%  (1) left lumpectomy with sentinel lymph node sampling  03/16/2017 showed a pT1b pN0, stage IA invasive ductal carcinoma, grade 1, with negative margins (2) adjuvant radiation not anticipated  (2) adjuvant radiation not felt to be needed if patient takes antiestrogen for 5 years  (3) tamoxifen started 04/10/2017  (a) status post remote hysterectomy with bilateral salpingo-oophorectomy  (b) bone density at [facility] 03/30/2017 shows a T score of -1.0   PLAN: Agustin is now a little over 4 years out from definitive surgery for her breast cancer with no evidence of disease recurrence.  This is very favorable.  She is tolerating tamoxifen well and the plan is to continue that to a total of 5 years which will take [place] to October 2023.  At that time  she will be ready to "graduate".  She has had a significant inframammary rash involving both breasts.  She has been using nystatin powder without success.  I wrote her for Ketoconazole cream to use daily until clear and Diflucan to take in addition for the first several days.  She understands this is a problem that is relatively easy to control but almost impossible to eradicate unless she is able to avoid skin on skin areas under the breast.  If her current bras are unable to do that for her we could write her for additional bras and she could go to second to nature to explore the possibility.  She will let us know  I  encouraged her to start a walking program and specifically to consider this celebrate the trails to recovery available through the [facility] website  Otherwise she will see [place] next October.  She knows to call for any other issue that may develop before then.  Total encounter time 25 minutes.*   Brenda Stephens, Brenda Dad, MD  08/05/22 12:41 PM Medical Oncology and Hematology [facility] [place], [place] [address] Tel. [phone]    Fax. [phone]    I, Wilburn Mylar, am acting as scribe for Dr. Virgie Stephens. Brenda Stephens.  I, Lurline Del MD, have reviewed the above documentation for accuracy and completeness, and I agree with the above.   *Total Encounter Time as defined by the Centers for Medicare and Medicaid Services includes, in addition to the face-to-face time of a patient visit (documented in the note above) non-face-to-face time: obtaining and reviewing outside history, ordering and reviewing medications, tests or procedures, care coordination (communications with other health care professionals or caregivers) and documentation in the medical record.

## 2022-08-05 NOTE — Progress Notes (Deleted)
Cardiology Clinic Note   Patient Name: Brenda Stephens Date of Encounter: 08/05/2022  Primary Care Provider:  Jola Baptist, PA-C Primary Cardiologist:  [name], MD  Patient Profile    Brenda Stephens is a 82 y.o. female with a past medical history of bilateral PE, DVT right lower extremity, hypertension, hyperlipidemia, aortic root dilatation, palpitations, dementia, breast cancer who presents to the clinic today for hospital follow-up.   Past Medical History    Past Medical History:  Diagnosis Date   Allergy    latex   Anemia    Anxiety    Arthritis    Cancer ([place]) 01/2017   right breast cancer   Depression    Dyslipidemia    Eustachian tube dysfunction    GERD (gastroesophageal reflux disease)    Grade I diastolic dysfunction 20/25/4270   Hx of cardiovascular stress test    Myoview 10/13:  apical thinning, EF 72%, no ischemia   Hx of colonic polyps    Dr Watt Climes   Hypertension    Hypothyroidism    Other abnormal glucose    Past Surgical History:  Procedure Laterality Date   ABDOMINAL HYSTERECTOMY     BSO for uterine fibroids   ACHILLES TENDON SURGERY Right 10/07/2012   Procedure: RIGHT REPAIR RUPTURE TENDON PRIMARY OPEN/PERCUTANEOUS ;  Surgeon: Ninetta Lights, MD;  Location: [place];  Service: Orthopedics;  Laterality: Right;   APPENDECTOMY     BREAST LUMPECTOMY WITH RADIOACTIVE SEED AND SENTINEL LYMPH NODE BIOPSY Right 03/16/2017   Procedure: RIGHT BREAST LUMPECTOMY WITH RADIOACTIVE SEED AND RIGHT AXILLARY SENTINEL  NODE BIOPSY ERAS PATHWAY;  Surgeon: [name], MD;  Location: [place];  Service: General;  Laterality: Right;  PECTORAL BLOCK   CATARACT EXTRACTION, BILATERAL     CHOLECYSTECTOMY     COLONOSCOPY W/ POLYPECTOMY     Dr Watt Climes   EAR CYST EXCISION Right 10/07/2012   Procedure: EXCISION BONE CYST BENIGN TUMOR [name] ;  Surgeon: Ninetta Lights, MD;  Location: [place];  Service:  Orthopedics;  Laterality: Right;   EXPLORATORY LAPAROTOMY     LEFT HEART CATH AND CORONARY ANGIOGRAPHY N/A 08/02/2019   Procedure: LEFT HEART CATH AND CORONARY ANGIOGRAPHY;  Surgeon: Martinique, Peter M, MD;  Location: [facility];  Service: Cardiovascular;  Laterality: N/A;   REVISION TOTAL SHOULDER TO REVERSE TOTAL SHOULDER Left 01/12/2019   Procedure: REVISION TOTAL SHOULDER TO REVERSE TOTAL SHOULDER;  Surgeon: Hiram Gash, MD;  Location: [facility];  Service: Orthopedics;  Laterality: Left;   SINUS SURGERY WITH INSTATRAK     TOTAL SHOULDER REPLACEMENT     Right shoulder   TOTAL SHOULDER REPLACEMENT  05/2010   Left   TUBAL LIGATION      Allergies  Allergies  Allergen Reactions   Latex Rash   Sulfa Antibiotics Anaphylaxis and Nausea Only   Aspirin Other (See Comments)    GI bleeding   Nsaids Other (See Comments)    GI upset    Oxycodone-Acetaminophen Nausea Only    History of Present Illness    Brenda Stephens has a past medical history of: Bilateral PE.  CTA chest (PE protocol) 06/30/2022: Bilateral near occlusive clots in both distal main pulmonary arteries with extension into the bilateral lobar main arteries, segmental arteries and a few downstream subsegmental arterial branches with overall large clot burden.  Evidence of right heart strain (RV/LV ratio equals 1.31) consistent with submassive (intermediate risk)  PE.  Trace pleural effusions. Echo 06/30/2022: EF 60 to 65%.  Moderate LVH.  Grade I DD.  RV function moderately reduced.  Free wall hypokinesis with normal apical systolic function consistent with McConnell sign as can be seen in acute PE.  Aortic valve sclerosis without stenosis. DVT right lower extremity.  Lower extremity venous ultrasound 06/30/2022: Acute DVT right popliteal vein.  No evidence of DVT on the left.  No evidence of popliteal cyst bilaterally. Hypertension. Hyperlipidemia. Aortic root dilatation. Palpitations. Telemetry monitor 01/31/2021: Normal sinus  rhythm.  No sustained arrhythmia.  Infrequent ventricular ectopy and supraventricular ectopy.  Symptoms of chest discomfort, fluttering, fatigue did not correlate with any significant arrhythmias. Chest pain. LHC 08/02/2019: Normal coronary anatomy.  Normal LV function.  Normal LVEDP. Dementia.  Breast CA. On tamoxifen.  Brenda Stephens is a longtime patient of Dr. Percival Spanish.  Patient was last seen in the office by Dr. Percival Spanish on 02/28/2022.  At that time she was stable.  She complained of some shortness of breath that was not felt to be secondary to volume overload.  Her O2 saturations were 97% while walking around the office so no further testing was felt to be indicated.  On 06/30/2022 patient presented to the emergency department via EMS for 1 week history of shortness of breath and chest pressure.  She was found to have bilateral pulmonary emboli as outlined above.  She was admitted to the hospital for IV heparin and eventual transition to oral anticoagulation.  Troponin 209>> 220>> 122>> 109 likely secondary to right heart strain.  Patient's tamoxifen was stopped until she could follow-up as an outpatient with oncology.  She was discharged on 07/05/2022 on Eliquis.  Patient returned to the emergency department via EMS on 07/15/2022 with worsening shortness of breath and pleuritic chest discomfort.  She also had a near syncopal episode on 07/14/2022.  Patient was unable to obtain Eliquis secondary to cost so had been without anticoagulation since discharge on 07/05/2022.  She was started on Lovenox until assistance could be given to obtain Eliquis.  Today, patient ***  Bilateral PE/DVT right leg.  CTA January 2024 showed bilateral near occlusive clots in both distal main pulmonary arteries with extension into the bilateral lobular main arteries, segmental arteries and a few downstream subsegmental arterial branches with overall large clot burden.  Evidence of right heart strain.  Patient was discharged from the  hospital on 07/05/2022 on Eliquis.  Unfortunately, patient had a hard time filling the prescription secondary to cost and was without anticoagulation until 07/15/2022.  She presented to the ED on that date with increased shortness of breath and pleuritic chest pain.  She was started on Lovenox until assistance could be obtained for patient to get Eliquis.  Upon discharge Eliquis was sent to [employer] [facility] the patient was provided with a voucher.  Patient *** Right heart strain secondary to PE.  Home Medications    No outpatient medications have been marked as taking for the 08/06/22 encounter (Appointment) with Darreld Mclean, PA-C.    Family History    Family History  Problem Relation Age of Onset   Hiatal hernia Mother    Heart attack Mother 86   Heart disease Father 80       CAD @ autopsy   Cancer Father        renal cancer   Heart attack Brother 54       fatal   Diabetes Neg Hx    Stroke Neg Hx  She indicated that her mother is deceased. She indicated that her father is deceased. She indicated that her brother is deceased. She indicated that the status of her neg hx is unknown.   Social History    Social History   Socioeconomic History   Marital status: Widowed    Spouse name: Not on file   Number of children: 1   Years of education: Not on file   Highest education level: Not on file  Occupational History   Not on file  Tobacco Use   Smoking status: Former    Packs/day: 0.50    Years: 6.00    Total pack years: 3.00    Types: Cigarettes    Quit date: 06/30/1962    Years since quitting: 60.1   Smokeless tobacco: Never   Tobacco comments:    Quit age 39  Vaping Use   Vaping Use: Never used  Substance and Sexual Activity   Alcohol use: Yes    Alcohol/week: 3.0 standard drinks of alcohol    Types: 3 Glasses of wine per week    Comment: wine  : < 3 glasses weekly   Drug use: No   Sexual activity: Yes    Birth control/protection: Post-menopausal,  Surgical  Other Topics Concern   Not on file  Social History Narrative   Lives husband and daughter lives with her.    Social Determinants of Health   Financial Resource Strain: Not on file  Food Insecurity: No Food Insecurity (07/01/2022)   Hunger Vital Sign    Worried About Running Out of Food in the Last Year: Never true    Ran Out of Food in the Last Year: Never true  Transportation Needs: No Transportation Needs (07/01/2022)   PRAPARE - Hydrologist (Medical): No    Lack of Transportation (Non-Medical): No  Physical Activity: Not on file  Stress: Not on file  Social Connections: Not on file  Intimate Partner Violence: Not At Risk (07/01/2022)   Humiliation, Afraid, Rape, and Kick questionnaire    Fear of Current or Ex-Partner: No    Emotionally Abused: No    Physically Abused: No    Sexually Abused: No     Review of Systems    General: *** No chills, fever, night sweats or weight changes.  Cardiovascular:  No chest pain, dyspnea on exertion, edema, orthopnea, palpitations, paroxysmal nocturnal dyspnea. Dermatological: No rash, lesions/masses Respiratory: No cough, dyspnea Urologic: No hematuria, dysuria Abdominal:   No nausea, vomiting, diarrhea, bright red blood per rectum, melena, or hematemesis Neurologic:  No visual changes, weakness, changes in mental status. All other systems reviewed and are otherwise negative except as noted above.  Physical Exam    VS:  There were no vitals taken for this visit. , BMI There is no height or weight on file to calculate BMI. GEN: *** Well nourished, well developed, in no acute distress. HEENT: Normal. Neck: Supple, no JVD, carotid bruits, or masses. Cardiac: RRR, no murmurs, rubs, or gallops. No clubbing, cyanosis, edema.  Radials/DP/PT 2+ and equal bilaterally.  Respiratory:  Respirations regular and unlabored, clear to auscultation bilaterally. GI: Soft, nontender, nondistended. MS: No deformity or  atrophy. Skin: Warm and dry, no rash. Neuro: Strength and sensation are intact. Psych: Normal affect.  Accessory Clinical Findings    The following studies were reviewed for this visit: ***  Recent Labs: 02/21/2022: Pro B Natriuretic peptide (BNP) 35.0 06/30/2022: B Natriuretic Peptide 284.4 07/15/2022: Magnesium 1.9 07/17/2022: ALT  19; BUN 22; Creatinine, Ser 1.10; Hemoglobin 12.2; Platelets 223; Potassium 4.2; Sodium 136   Recent Lipid Panel    Component Value Date/Time   CHOL 208 (H) 07/19/2012 1520   TRIG 152.0 (H) 07/19/2012 1520   HDL 70.30 07/19/2012 1520   CHOLHDL 3 07/19/2012 1520   VLDL 30.4 07/19/2012 1520   LDLCALC 91 06/13/2011 1134   LDLDIRECT 108.4 07/19/2012 1520    No BP recorded.  {Refresh Note OR Click here to enter BP  :1}***    ECG personally reviewed by me today: ***  No significant changes from ***  {Does this patient have ATRIAL FIBRILLATION?[phone]}   Assessment & Plan   ***      Disposition: ***   Justice Britain. Harlen Danford, DNP, NP-C     08/05/2022, 9:12 AM [address] Office [phone] Fax [phone]

## 2022-08-06 ENCOUNTER — Ambulatory Visit: Payer: MEDICARE | Attending: General Practice | Admitting: Student

## 2022-08-18 ENCOUNTER — Other Ambulatory Visit (HOSPITAL_COMMUNITY): Payer: Self-pay

## 2022-08-23 NOTE — Progress Notes (Signed)
Subjective:    Patient ID: NIMSI MALES, female    DOB: [birth year], 82 y.o.   MRN: [id number]  HPI F former smoker followed for allergic rhinitis, Bronchitis, complicated by HBP, GERD, Anemia, R breast cancer Walk Test 10/12/19- desat to 89% with max HR 95. Mostly sats around 94-96. 6MWT 03/28/21- 306 meters, average pace, stopping x3 for SOB, lowest O2 sat 95%, max HR 103. Cardiac cath 08/02/19- normal PFT10/26/22- she was again unable to follow directions- when to inhale and when to blow out- tech unable to complete test.  Hgb 12.7 9/13 [facility] ------------------------------------------------------------------------------------  02/21/22- 82 yoF former smoker (3 pk yrs)followed for allergic Rhinitis, acute Bronchitis,  Lung Nodule, complicated by HBP, GERD, Anemia, R breast cancer, Alzheimers,  -albuterol hfa ?, Flonase,  Covid vax-2 Phizer ------Pt f/u she is having increased SOB w/ any weight bearing activities and exertion. Daughter is here and helpful with questions.  Note that patient was unable to follow instructions for PFT in 2022, a common indicator of early dementia. She says she still notices dyspnea on exertion and a feeling that she is heavy on her feet during ADLs at home.  She denies any acute change, obvious progression, wheeze or cough.  She had tried rescue inhaler a few times without recognizing any benefit.  "Feet always swell". Daughter says neurologist had recommended physical therapy to help with stamina and balance.  Daughter asks referral to a physical therapist at [facility] in [place]. CT discussed with plan to repeat for follow-up of nodules in 1 year. CT chest 01/03/22- IMPRESSION: 1. Stable appearance of bilateral lower lobe pulmonary nodules measuring up to 5 mm. Interval stability is reassuring for benign etiology. Continued attention on follow-up recommend in this patient with a history of breast cancer. 2. Hepatic steatosis. 3. Aortic Atherosclerosis  (ICD10-I70.0).  08/25/22- 82 yoF former smoker (3 pk yrs) followed for allergic Rhinitis, acute Bronchitis,  Lung Nodule, complicated by HBP, ZC5YI,  GERD, Anemia, R breast cancer, Alzheimers, DVT/PE/Eliquis,  -albuterol hfa, Flonase,  Covid vax-5 Phizer Flu vax-had Hosp 1/16-1/18- after RDVT/ bilat PE.  Follow-up manage by her PCP. New paresthesias left leg.  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 Albuterol inhaler helps, used occasionally as needed.  No significant wheeze most days. CTaPE 06/30/22- 1. Positive for acute PE with CT evidence of right heart strain (RV/LV Ratio = 1.31 ) consistent with at least submassive (intermediate risk) PE. The presence of right heart strain has been associated with an increased risk of morbidity and mortality. Please refer to the "PE Focused" order set in EPIC. 2. Aortic and coronary artery atherosclerosis. 3. Trace pleural effusions. 4. Hepatic steatosis. 5. Results phoned to Dr. Leonette Monarch in the ED at 3:03 a.m., 06/30/2022, with verbal acknowledgement of the key findings. Aortic Atherosclerosis (ICD10-I70.0).   ROS-see HPI   + = positive Constitutional:   No-   weight loss, night sweats, fevers, chills, fatigue, lassitude. HEENT:   + headaches, difficulty swallowing, tooth/dental problems, sore throat,       No-  sneezing, itching, ear ache, nasal congestion, +post nasal drip,  CV:  No-   chest pain, orthopnea, PND, swelling in lower extremities, anasarca,  dizziness, palpitations Resp: + shortness of breath with exertion or at rest.               +productive cough,  No non-productive cough,  No- coughing up of blood.   +   change in color of mucus.  No- wheezing.  Skin: No-   rash or lesions. GI:  No-   heartburn, indigestion, abdominal pain, nausea, vomiting, GU:  MS:  No-   joint pain or swelling.  Neuro-     nothing unusual Psych:  No- change in mood or affect. No depression or anxiety.  No memory loss.    Objective:   OBJ- Physical Exam General- Alert, Oriented, Affect-appropriate, Distress- none acute, + overweight Skin- rash-none, lesions- none, excoriation- none Lymphadenopathy- none Head- atraumatic            Eyes- Gross vision intact, PERRLA, conjunctivae and secretions clear            Ears- Hearing ok,             Nose-, no-Septal dev,  polyps, erosion, perforation             Throat- Mallampati III , mucosa clear , drainage- none, tonsils- atrophic Neck- flexible , trachea midline, no stridor , thyroid nl, carotid no bruit Chest - symmetrical excursion , unlabored           Heart/CV- RRR , no murmur , no gallop  , no rub, nl s1 s2                           - JVD- none , edema-none, stasis changes- none, varices- none           Lung- + fine rales, unlabored, wheeze- none, cough none, dullness-none, rub- none           Chest wall-  Abd- Br/ Gen/ Rectal- Not done, not indicated Extrem- cyanosis- none, clubbing, none, atrophy- none, strength- nl.  Neuro- +slight head bob Assessment & Plan:

## 2022-08-25 ENCOUNTER — Ambulatory Visit (INDEPENDENT_AMBULATORY_CARE_PROVIDER_SITE_OTHER): Payer: MEDICARE | Admitting: Internal Medicine

## 2022-08-25 ENCOUNTER — Encounter: Payer: Self-pay | Admitting: Internal Medicine

## 2022-08-25 VITALS — BP 130/80 | HR 80 | Ht 63.0 in | Wt 189.4 lb

## 2022-08-25 DIAGNOSIS — J4520 Mild intermittent asthma, uncomplicated: Secondary | ICD-10-CM

## 2022-08-25 MED ORDER — ALBUTEROL SULFATE HFA 108 (90 BASE) MCG/ACT IN AERS: 2.0000 | INHALATION_SPRAY | Freq: Four times a day (QID) | RESPIRATORY_TRACT | 12 refills | Status: DC | PRN

## 2022-08-25 MED ORDER — BENZONATATE 200 MG PO CAPS: 200.0000 mg | ORAL_CAPSULE | Freq: Three times a day (TID) | ORAL | 1 refills | Status: DC | PRN

## 2022-08-25 NOTE — Patient Instructions (Signed)
Script sent refilling albuterol hfa inhaler   Script sent for Tessalon (benzonatate) perles   try 1 every 8 hours as needed for cough  Hope you feel better soon

## 2022-08-28 ENCOUNTER — Other Ambulatory Visit (HOSPITAL_COMMUNITY): Payer: Self-pay

## 2022-09-17 ENCOUNTER — Encounter: Payer: Self-pay | Admitting: Internal Medicine

## 2022-09-17 NOTE — Assessment & Plan Note (Signed)
She seems to feel comfortable controlled using albuterol only intermittently as needed.  As in her age, obesity/deconditioning and recent pulmonary emboli, her exercise tolerance does not seem very much different.

## 2022-09-17 NOTE — Assessment & Plan Note (Signed)
Eliquis managed by PCP 

## 2022-10-27 ENCOUNTER — Telehealth: Payer: Self-pay | Admitting: Cardiology

## 2022-10-27 ENCOUNTER — Other Ambulatory Visit (HOSPITAL_COMMUNITY): Payer: Self-pay

## 2022-10-27 NOTE — Telephone Encounter (Signed)
Pt c/o Shortness Of Breath: STAT if SOB developed within the last 24 hours or pt is noticeably SOB on the phone  1. Are you currently SOB (can you hear that pt is SOB on the phone)?  Not currently with the patient  2. How long have you been experiencing SOB?  Since the end of last year, worsening over the past 4 months  3. Are you SOB when sitting or when up moving around?  When up and moving around  4. Are you currently experiencing any other symptoms?  Stomach swollen, dizziness

## 2022-10-27 NOTE — Telephone Encounter (Signed)
Patient daughter states worsening SOB.  Has pulmonary embolism and DVT right leg.  She is on Eliquis since January. Chest xray 1 week ago which shows lungs clear. But cardiac Silhouette enlarged etc. She has been to ED and spoken to PCP no follow up recommended in regard to at this time.  She states heaviness on her chest, dizzy, SOB, fatigued, increase size to stomach.  She discusses all her previous test and findings.  She states she has blood in stool and has spoken to her GI in regard and was instructed to go to Er if continues.  Through conversation sounds like possible hemorrhoids.  Advised if worsening or continues to go to ED. Advised to measure stomach at largest point and if continues to increase, then go to ED.  Has seen PCP and to F/U in 3 months.  Scheduled with our office May 10th but again if any change or worsening in symptoms to go to ED.

## 2022-11-05 ENCOUNTER — Other Ambulatory Visit (HOSPITAL_COMMUNITY): Payer: Self-pay

## 2022-11-05 ENCOUNTER — Encounter: Payer: Self-pay | Admitting: Obstetrics & Gynecology

## 2022-11-06 NOTE — Progress Notes (Signed)
Cardiology Office Note:   Date:  11/07/2022  ID:  Brenda Stephens, DOB [birth year], MRN [id number]  History of Present Illness:   Brenda Stephens is a 82 y.o. female who I saw in September last year for evaluation of chest pain.  I saw her for palpitations in 2017 and leg swelling in 2019.  She had chest pain and had normal coronaries in 2021.  She was in the hospital early this year and found to have submassive bilateral pulmonary emboli with right lower extremity DVT.  She was placed on heparin and then eventually Eliquis.  She was thought to have breast cancer and sedentary lifestyle as an etiology.  Echocardiogram demonstrated well-preserved ejection fraction.  Her daughter called recently says she was having increasing shortness of breath.    She actually looks like she is doing relatively well.  She does complain of some pressure-like discomfort over her left chest above her breast.  This seems to be there intermittently.  She had a chest x-ray which showed a little bit of cardiomegaly and an aortic calcium.  She had a little bit of lower extremity swelling and a little bit of abdominal distention.  She is not having any new shortness of breath but she is very sedentary.  Her daughter says she just gets around a little bit in the house and stays in her chair or in her room in bed a lot.  She is not having any new PND or orthopnea.  Has not noticed any new palpitations, presyncope or syncope.  ROS: As stated in the HPI and negative for all other systems.  Studies Reviewed:    EKG: Sinus tachycardia, rate 108, low voltage, premature ventricular contractions, premature atrial contractions, no acute ST-T wave changes.   Risk Assessment/Calculations:              Physical Exam:   VS:  BP 118/64   Pulse (!) 108   Ht 5\' 3"  (1.6 m)   Wt 193 lb 6.4 oz (87.7 kg)   SpO2 96%   BMI 34.26 kg/m    Wt Readings from Last 3 Encounters:  11/07/22 193 lb 6.4 oz (87.7 kg)  08/25/22 189 lb 6.4 oz (85.9  kg)  07/16/22 187 lb 6.3 oz (85 kg)     GEN: Well nourished, well developed in no acute distress NECK: No JVD; No carotid bruits CARDIAC: RRR, no murmurs, rubs, gallops RESPIRATORY:  Clear to auscultation without rales, wheezing or rhonchi  ABDOMEN: Soft, non-tender, non-distended EXTREMITIES: Mild leg edema; No deformity   ASSESSMENT AND PLAN:   LEG SWELLING: Her legs and abdomen are mildly swollen.  I am to give her Lasix 20 mg for 4 to 5 days.  If this helps I might continue this.  She will let me know whether she wants to continue this at which point she would need follow-up labs.  If she has any continued swelling I would have a low threshold for repeat echocardiography.  PALPITATIONS: She has some sinus tachycardia.  I like to reduce her Diovan as her blood pressure is marginal and add a little bit of metoprolol XL 25 mg daily.  She was on beta-blockers in the past but does not recall why this was stopped.  I do not see a contraindication or allergy.  HTN:    Her blood pressure is at target and I am going to manage it as above.    PULMONARY EMBOLI: She is going to continue anticoagulation I would suggest  that she be on at least a maintenance dose after she is on the acute therapy.  Her risk factors for recurrent pulmonary emboli are ongoing.    AORTIC ROOT ENLARGEMENT: This was 40 mm.  I will follow this conservatively.  CHEST PAIN:   She has a non anginal sounding chest discomfort and I am not suggesting further cardiovascular testing.        Signed, Rollene Rotunda, MD

## 2022-11-07 ENCOUNTER — Ambulatory Visit: Payer: MEDICARE | Attending: Cardiology | Admitting: Cardiology

## 2022-11-07 ENCOUNTER — Encounter: Payer: Self-pay | Admitting: Cardiology

## 2022-11-07 VITALS — BP 118/64 | HR 108 | Ht 63.0 in | Wt 193.4 lb

## 2022-11-07 DIAGNOSIS — I2699 Other pulmonary embolism without acute cor pulmonale: Secondary | ICD-10-CM | POA: Insufficient documentation

## 2022-11-07 DIAGNOSIS — R0602 Shortness of breath: Secondary | ICD-10-CM | POA: Diagnosis not present

## 2022-11-07 MED ORDER — METOPROLOL SUCCINATE ER 25 MG PO TB24: 25.0000 mg | ORAL_TABLET | Freq: Every day | ORAL | 3 refills | Status: DC

## 2022-11-07 MED ORDER — VALSARTAN 80 MG PO TABS: 80.0000 mg | ORAL_TABLET | Freq: Every day | ORAL | 3 refills | Status: DC

## 2022-11-07 MED ORDER — FUROSEMIDE 20 MG PO TABS: 20.0000 mg | ORAL_TABLET | Freq: Every day | ORAL | 3 refills | Status: DC

## 2022-11-07 NOTE — Patient Instructions (Signed)
Medication Instructions:   TAKE FUROSEMIDE 20 MG ONCE DAILY FOR 5 DAYS AND THEN STOP  DECREASE VALSARTAN TO 80 MG ONCE DAILY=1/2 OF THE 160 MG TABLET ONCE DAILY  START METOPROLOL SUCC ER 25 MG ONCE DAILY AT BEDTIME  *If you need a refill on your cardiac medications before your next appointment, please call your pharmacy*   Follow-Up: At [facility], you and your health needs are our priority.  As part of our continuing mission to provide you with exceptional heart care, we have created designated Provider Care Teams.  These Care Teams include your primary Cardiologist (physician) and Advanced Practice Providers (APPs -  Physician Assistants and Nurse Practitioners) who all work together to provide you with the care you need, when you need it.  We recommend signing up for the patient portal called "MyChart".  Sign up information is provided on this After Visit Summary.  MyChart is used to connect with patients for Virtual Visits (Telemedicine).  Patients are able to view lab/test results, encounter notes, upcoming appointments, etc.  Non-urgent messages can be sent to your provider as well.   To learn more about what you can do with MyChart, go to [email or web address].    Your next appointment:   2 month(s)  Provider:   ANY APP

## 2022-11-27 ENCOUNTER — Other Ambulatory Visit (HOSPITAL_COMMUNITY): Payer: Self-pay

## 2023-01-02 NOTE — Progress Notes (Deleted)
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  Saw Dr. Antoine Poche Nov 07, 2022 for complaints of shortness of breath.  The patient is very sedentary staying in her chair or in her room in bed a lot.  She was found to have some mild swelling in her legs and abdomen, and was given Lasix 20 mg for 4 to 5 days.  She was started on low-dose metoprolol XL 25 mg daily she was continued on Eliquis.  Past Medical History    Past Medical History:  Diagnosis Date   Allergy    latex   Anemia    Anxiety    Arthritis    Cancer (HCC) 01/2017   right breast cancer   Depression    Dyslipidemia    Eustachian tube dysfunction    GERD (gastroesophageal reflux disease)    Grade I diastolic dysfunction 07/15/2022   Hx of cardiovascular stress test    Myoview 10/13:  apical thinning, EF 72%, no ischemia   Hx of colonic polyps    Dr Ewing Schlein   Hypertension    Hypothyroidism    Other abnormal glucose    Past Surgical History:  Procedure Laterality Date   ABDOMINAL HYSTERECTOMY     BSO for uterine fibroids   ACHILLES TENDON SURGERY Right 10/07/2012   Procedure: RIGHT REPAIR RUPTURE TENDON PRIMARY OPEN/PERCUTANEOUS ;  Surgeon: Loreta Ave, MD;  Location: [facility];  Service: Orthopedics;  Laterality: Right;   APPENDECTOMY     BREAST LUMPECTOMY WITH RADIOACTIVE SEED AND SENTINEL LYMPH NODE BIOPSY Right 03/16/2017   Procedure: RIGHT BREAST LUMPECTOMY WITH RADIOACTIVE SEED AND RIGHT AXILLARY SENTINEL  NODE BIOPSY ERAS PATHWAY;  Surgeon: Emelia Loron, MD;  Location: [facility];  Service: General;  Laterality: Right;  PECTORAL BLOCK   CATARACT  EXTRACTION, BILATERAL     CHOLECYSTECTOMY     COLONOSCOPY W/ POLYPECTOMY     Dr Ewing Schlein   EAR CYST EXCISION Right 10/07/2012   Procedure: EXCISION BONE CYST BENIGN TUMOR Manuela Neptune ;  Surgeon: Loreta Ave, MD;  Location: [facility];  Service: Orthopedics;  Laterality: Right;   EXPLORATORY LAPAROTOMY     LEFT HEART CATH AND CORONARY ANGIOGRAPHY N/A 08/02/2019   Procedure: LEFT HEART CATH AND CORONARY ANGIOGRAPHY;  Surgeon: Swaziland, Peter M, MD;  Location: [facility];  Service: Cardiovascular;  Laterality: N/A;   REVISION TOTAL SHOULDER TO REVERSE TOTAL SHOULDER Left 01/12/2019   Procedure: REVISION TOTAL SHOULDER TO REVERSE TOTAL SHOULDER;  Surgeon: Bjorn Pippin, MD;  Location: [facility];  Service: Orthopedics;  Laterality: Left;   SINUS SURGERY WITH INSTATRAK     TOTAL SHOULDER REPLACEMENT     Right shoulder   TOTAL SHOULDER REPLACEMENT  05/2010   Left   TUBAL LIGATION      Allergies  Allergies  Allergen Reactions   Latex Rash   Sulfa Antibiotics Anaphylaxis and Nausea Only   Aspirin Other (See Comments)    GI bleeding   Nsaids Other (See Comments)    GI upset    Oxycodone-Acetaminophen Nausea Only    History of Present Illness    [name] comes today for follow-up after being  seen by Dr. Antoine Poche in May 2024 for complaints of shortness of breath and was found to have some mild abdominal and lower extremity edema.  She was placed on Lasix 20 mg for 4 to 5 days, and started on low-dose metoprolol.  She was continued on Eliquis for history of DVT and PE.  Home Medications    Current Outpatient Medications  Medication Sig Dispense Refill   acetaminophen (TYLENOL) 650 MG CR tablet Take 1 tablet (650 mg total) by mouth every 8 (eight) hours as needed for pain (headaches). (Patient taking differently: Take 1,300 mg by mouth every 8 (eight) hours as needed for pain (or headaches).)     albuterol (VENTOLIN HFA) 108 (90 Base) MCG/ACT inhaler Inhale 2 puffs  into the lungs every 6 (six) hours as needed for wheezing or shortness of breath. 18 g 12   apixaban (ELIQUIS) 5 MG TABS tablet Take 1 tablet (5 mg total) by mouth 2 (two) times daily. 60 tablet 1   APIXABAN (ELIQUIS) VTE STARTER PACK (10MG  AND 5MG ) Take as directed on package: start with two-5mg  tablets twice daily for 7 days. On day 8, switch to one-5mg  tablet twice daily. 74 each 0   benzonatate (TESSALON) 200 MG capsule Take 1 capsule (200 mg total) by mouth 3 (three) times daily as needed for cough. 30 capsule 1   Biotin 47829 MCG TABS Take 10,000 mcg by mouth daily.     cetirizine (ZYRTEC) 10 MG tablet Take 10 mg by mouth at bedtime.     donepezil (ARICEPT) 10 MG tablet Take 10 mg by mouth at bedtime.     esomeprazole (NEXIUM) 20 MG capsule Take 40 mg by mouth daily.     furosemide (LASIX) 20 MG tablet Take 1 tablet (20 mg total) by mouth daily. 20 tablet 3   hydrOXYzine (ATARAX) 25 MG tablet Take 1 tablet (25 mg total) by mouth every 6 (six) hours as needed for anxiety. 20 tablet 0   meclizine (ANTIVERT) 25 MG tablet Take 1 tablet (25 mg total) by mouth 3 (three) times daily as needed for dizziness. (Patient taking differently: Take 25 mg by mouth 3 (three) times daily as needed for dizziness or nausea.) 30 tablet 0   memantine (NAMENDA) 10 MG tablet Take 10 mg by mouth 2 (two) times daily.     metoprolol succinate (TOPROL XL) 25 MG 24 hr tablet Take 1 tablet (25 mg total) by mouth at bedtime. 90 tablet 3   NEXIUM 24HR 20 MG capsule Take 40 mg by mouth daily before breakfast.     nitroGLYCERIN (NITROSTAT) 0.4 MG SL tablet Place 1 tablet (0.4 mg total) under the tongue every 5 (five) minutes as needed for chest pain. 20 tablet 2   Olopatadine HCl (PATADAY OP) Place 1 drop into both eyes daily as needed (for allergies).     oxybutynin (DITROPAN) 5 MG tablet Take 5 mg by mouth 2 (two) times daily.     Polyethyl Glycol-Propyl Glycol (SYSTANE OP) Place 1 drop into both eyes 2 (two) times daily as  needed (for dryness).     QUEtiapine (SEROQUEL) 50 MG tablet Take 50 mg by mouth at bedtime.     RESTASIS 0.05 % ophthalmic emulsion Place 1 drop into both eyes 2 (two) times daily.     sennosides-docusate sodium (SENOKOT-S) 8.6-50 MG tablet Take 1 tablet by mouth daily as needed for constipation.     tamsulosin (FLOMAX) 0.4 MG CAPS capsule Take 0.4 mg by mouth at bedtime.  valsartan (DIOVAN) 80 MG tablet Take 1 tablet (80 mg total) by mouth daily. 90 tablet 3   venlafaxine XR (EFFEXOR-XR) 150 MG 24 hr capsule TAKE 1 CAPSULE (150 MG TOTAL) BY MOUTH DAILY. (Patient taking differently: Take 150 mg by mouth daily.) 90 capsule 2   No current facility-administered medications for this visit.     Family History    Family History  Problem Relation Age of Onset   Hiatal hernia Mother    Heart attack Mother 74   Heart disease Father 12       CAD @ autopsy   Cancer Father        renal cancer   Heart attack Brother 73       fatal   Diabetes Neg Hx    Stroke Neg Hx    She indicated that her mother is deceased. She indicated that her father is deceased. She indicated that her brother is deceased. She indicated that the status of her neg hx is unknown.  Social History    Social History   Socioeconomic History   Marital status: Widowed    Spouse name: Not on file   Number of children: 1   Years of education: Not on file   Highest education level: Not on file  Occupational History   Not on file  Tobacco Use   Smoking status: Former    Packs/day: 0.50    Years: 6.00    Additional pack years: 0.00    Total pack years: 3.00    Types: Cigarettes    Quit date: 06/30/1962    Years since quitting: 60.5   Smokeless tobacco: Never   Tobacco comments:    Quit age 23  Vaping Use   Vaping Use: Never used  Substance and Sexual Activity   Alcohol use: Yes    Alcohol/week: 3.0 standard drinks of alcohol    Types: 3 Glasses of wine per week    Comment: wine  : < 3 glasses weekly   Drug  use: No   Sexual activity: Yes    Birth control/protection: Post-menopausal, Surgical  Other Topics Concern   Not on file  Social History Narrative   Lives husband and daughter lives with her.    Social Determinants of Health   Financial Resource Strain: Not on file  Food Insecurity: No Food Insecurity (07/01/2022)   Hunger Vital Sign    Worried About Running Out of Food in the Last Year: Never true    Ran Out of Food in the Last Year: Never true  Transportation Needs: No Transportation Needs (07/01/2022)   PRAPARE - Administrator, Civil Service (Medical): No    Lack of Transportation (Non-Medical): No  Physical Activity: Not on file  Stress: Not on file  Social Connections: Not on file  Intimate Partner Violence: Not At Risk (07/01/2022)   Humiliation, Afraid, Rape, and Kick questionnaire    Fear of Current or Ex-Partner: No    Emotionally Abused: No    Physically Abused: No    Sexually Abused: No     Review of Systems    General:  No chills, fever, night sweats or weight changes.  Cardiovascular:  No chest pain, dyspnea on exertion, edema, orthopnea, palpitations, paroxysmal nocturnal dyspnea. Dermatological: No rash, lesions/masses Respiratory: No cough, dyspnea Urologic: No hematuria, dysuria Abdominal:   No nausea, vomiting, diarrhea, bright red blood per rectum, melena, or hematemesis Neurologic:  No visual changes, wkns, changes in mental status. All other systems  reviewed and are otherwise negative except as noted above.       Physical Exam    VS:  There were no vitals taken for this visit. , BMI There is no height or weight on file to calculate BMI.     GEN: Well nourished, well developed, in no acute distress. HEENT: normal. Neck: Supple, no JVD, carotid bruits, or masses. Cardiac: RRR, no murmurs, rubs, or gallops. No clubbing, cyanosis, edema.  Radials/DP/PT 2+ and equal bilaterally.  Respiratory:  Respirations regular and unlabored, clear to  auscultation bilaterally. GI: Soft, nontender, nondistended, BS + x 4. MS: no deformity or atrophy. Skin: warm and dry, no rash. Neuro:  Strength and sensation are intact. Psych: Normal affect.      Lab Results  Component Value Date   WBC 8.2 07/17/2022   HGB 12.2 07/17/2022   HCT 38.1 07/17/2022   MCV 93.6 07/17/2022   PLT 223 07/17/2022   Lab Results  Component Value Date   CREATININE 1.10 (H) 07/17/2022   BUN 22 07/17/2022   NA 136 07/17/2022   K 4.2 07/17/2022   CL 105 07/17/2022   CO2 21 (L) 07/17/2022   Lab Results  Component Value Date   ALT 19 07/17/2022   AST 24 07/17/2022   ALKPHOS 37 (L) 07/17/2022   BILITOT 0.7 07/17/2022   Lab Results  Component Value Date   CHOL 208 (H) 07/19/2012   HDL 70.30 07/19/2012   LDLCALC 91 06/13/2011   LDLDIRECT 108.4 07/19/2012   TRIG 152.0 (H) 07/19/2012   CHOLHDL 3 07/19/2012    Lab Results  Component Value Date   HGBA1C 6.1 06/13/2011     Review of Prior Studies   Echocardiogram 06/30/2022 1. Left ventricular ejection fraction, by estimation, is 60 to 65%. The  left ventricle has normal function. The left ventricle has no regional  wall motion abnormalities. There is moderate left ventricular hypertrophy.  Left ventricular diastolic  parameters are consistent with Grade I diastolic dysfunction (impaired  relaxation).   2. Right ventricular systolic function is moderately reduced. The right  ventricular size is normal. There is normal pulmonary artery systolic  pressure. Free wall hypokinesis with normal apical systolic function  consistent with McConnell's sign as can be   seen in acute PE   3. The mitral valve is normal in structure. No evidence of mitral valve  regurgitation. No evidence of mitral stenosis.   4. The aortic valve is tricuspid. Aortic valve regurgitation is not  visualized. Aortic valve sclerosis is present, with no evidence of aortic  valve stenosis.    LHC 08/02/2019 The left ventricular  systolic function is normal. LV end diastolic pressure is normal. The left ventricular ejection fraction is 55-65% by visual estimate.   1. Normal coronary anatomy 2. Normal LV function 3. Normal LVEDP    Assessment & Plan   1.  ***     {Are you ordering a CV Procedure (e.g. stress test, cath, DCCV, TEE, etc)?   Press F2        :[id number]}   Signed, Bettey Mare. Liborio Nixon, ANP, AACC   01/02/2023 5:24 PM      Office [phone] Fax [phone]  Notice: This dictation was prepared with Dragon dictation along with smaller phrase technology. Any transcriptional errors that result from this process are unintentional and may not be corrected upon review.

## 2023-01-07 ENCOUNTER — Encounter: Payer: Self-pay | Admitting: Cardiology

## 2023-01-07 ENCOUNTER — Ambulatory Visit: Payer: MEDICARE | Admitting: Adult Health

## 2023-01-27 ENCOUNTER — Other Ambulatory Visit: Payer: Self-pay | Admitting: Internal Medicine

## 2023-02-09 NOTE — Progress Notes (Unsigned)
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  Echocardiogram revealed well-preserved EF.  Last seen in the office by Dr. Antoine Stephens on 11/07/2022.  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  On the last Ov she was found to have volume overload with the edema in her legs and abdomen.  She was given Lasix 20 mg for 4 to 5 days.  Diovan was reduced, and metoprolol succinate 25 mg daily was provided.  She was continued on anticoagulation.  Past Medical History    Past Medical History:  Diagnosis Date   Allergy    latex   Anemia    Anxiety    Arthritis    Cancer (HCC) 01/2017   right breast cancer   Depression    Dyslipidemia    Eustachian tube dysfunction    GERD (gastroesophageal reflux disease)    Grade I diastolic dysfunction 07/15/2022   Hx of cardiovascular stress test    Myoview 10/13:  apical thinning, EF 72%, no ischemia   Hx of colonic polyps    Dr Ewing Schlein   Hypertension    Hypothyroidism    Other abnormal glucose    Past Surgical History:  Procedure Laterality Date   ABDOMINAL HYSTERECTOMY     BSO for uterine fibroids   ACHILLES TENDON SURGERY Right 10/07/2012   Procedure: RIGHT REPAIR RUPTURE TENDON PRIMARY OPEN/PERCUTANEOUS ;  Surgeon: Loreta Ave, MD;  Location: [facility];  Service: Orthopedics;  Laterality: Right;   APPENDECTOMY     BREAST LUMPECTOMY WITH RADIOACTIVE SEED AND SENTINEL LYMPH NODE BIOPSY Right 03/16/2017   Procedure: RIGHT BREAST LUMPECTOMY WITH RADIOACTIVE SEED AND RIGHT AXILLARY SENTINEL  NODE BIOPSY ERAS PATHWAY;  Surgeon: Emelia Loron, MD;  Location: [facility];  Service: General;  Laterality: Right;  PECTORAL BLOCK   CATARACT EXTRACTION, BILATERAL     CHOLECYSTECTOMY     COLONOSCOPY W/ POLYPECTOMY     Dr Ewing Schlein   EAR CYST EXCISION Right 10/07/2012   Procedure: EXCISION BONE CYST BENIGN TUMOR Manuela Neptune ;  Surgeon: Loreta Ave, MD;  Location: [facility];  Service: Orthopedics;  Laterality: Right;   EXPLORATORY LAPAROTOMY     LEFT HEART CATH AND CORONARY ANGIOGRAPHY N/A 08/02/2019   Procedure: LEFT HEART CATH AND CORONARY ANGIOGRAPHY;  Surgeon: Swaziland, Peter M, MD;  Location: [facility];  Service: Cardiovascular;  Laterality: N/A;   REVISION TOTAL SHOULDER TO REVERSE TOTAL SHOULDER Left 01/12/2019   Procedure: REVISION TOTAL SHOULDER TO REVERSE TOTAL SHOULDER;  Surgeon: Bjorn Pippin, MD;  Location: [facility];  Service: Orthopedics;  Laterality: Left;   SINUS SURGERY WITH INSTATRAK     TOTAL SHOULDER REPLACEMENT     Right shoulder   TOTAL SHOULDER REPLACEMENT  05/2010   Left   TUBAL LIGATION      Allergies  Allergies  Allergen Reactions   Latex Rash   Sulfa Antibiotics Anaphylaxis and Nausea Only   Aspirin Other (See Comments)    GI bleeding   Nsaids Other (See Comments)    GI upset    Oxycodone-Acetaminophen Nausea Only    History  of Present Illness    Brenda Stephens  today for follow-up after being seen by Dr. Antoine Stephens after complaints of volume overload lower extremity edema.  She also had complaints of palpitations.  She comes today feeling worsening palpitations, shortness of breath, mild chest pressure, profound fatigue.  She states she is unable to lay flat due to trouble breathing.  The patient is now in a wheelchair here in the office as she was unable to walk very far without becoming more short of breath.  She continues to have some lower extremity discomfort, but no overt edema.  Home Medications    Current Outpatient Medications  Medication Sig  Dispense Refill   acetaminophen (TYLENOL) 650 MG CR tablet Take 1 tablet (650 mg total) by mouth every 8 (eight) hours as needed for pain (headaches). (Patient taking differently: Take 1,300 mg by mouth every 8 (eight) hours as needed for pain (or headaches).)     albuterol (VENTOLIN HFA) 108 (90 Base) MCG/ACT inhaler Inhale 2 puffs into the lungs every 6 (six) hours as needed for wheezing or shortness of breath. 18 g 12   apixaban (ELIQUIS) 5 MG TABS tablet Take 1 tablet (5 mg total) by mouth 2 (two) times daily. 60 tablet 1   benzonatate (TESSALON) 200 MG capsule TAKE 1 CAPSULE (200 MG TOTAL) BY MOUTH 3 (THREE) TIMES DAILY AS NEEDED FOR COUGH. 30 capsule 1   Biotin 16109 MCG TABS Take 10,000 mcg by mouth daily.     cetirizine (ZYRTEC) 10 MG tablet Take 10 mg by mouth at bedtime.     donepezil (ARICEPT) 10 MG tablet Take 10 mg by mouth at bedtime.     hydrOXYzine (ATARAX) 25 MG tablet Take 1 tablet (25 mg total) by mouth every 6 (six) hours as needed for anxiety. 20 tablet 0   meclizine (ANTIVERT) 25 MG tablet Take 1 tablet (25 mg total) by mouth 3 (three) times daily as needed for dizziness. (Patient taking differently: Take 25 mg by mouth 3 (three) times daily as needed for dizziness or nausea.) 30 tablet 0   memantine (NAMENDA) 10 MG tablet Take 10 mg by mouth 2 (two) times daily.     metoprolol succinate (TOPROL XL) 25 MG 24 hr tablet Take 1 tablet (25 mg total) by mouth at bedtime. 90 tablet 3   NEXIUM 24HR 20 MG capsule Take 40 mg by mouth daily before breakfast.     Olopatadine HCl (PATADAY OP) Place 1 drop into both eyes daily as needed (for allergies).     oxybutynin (DITROPAN) 5 MG tablet Take 5 mg by mouth 2 (two) times daily.     Polyethyl Glycol-Propyl Glycol (SYSTANE OP) Place 1 drop into both eyes 2 (two) times daily as needed (for dryness).     QUEtiapine (SEROQUEL) 50 MG tablet Take 50 mg by mouth at bedtime.     sennosides-docusate sodium (SENOKOT-S) 8.6-50 MG tablet Take 1 tablet  by mouth daily as needed for constipation.     tamsulosin (FLOMAX) 0.4 MG CAPS capsule Take 0.4 mg by mouth at bedtime.     valsartan (DIOVAN) 80 MG tablet Take 1 tablet (80 mg total) by mouth daily. 90 tablet 3   venlafaxine XR (EFFEXOR-XR) 150 MG 24 hr capsule TAKE 1 CAPSULE (150 MG TOTAL) BY MOUTH DAILY. (Patient taking differently: Take 150 mg by mouth daily.) 90 capsule 2   APIXABAN (ELIQUIS) VTE STARTER PACK (10MG  AND 5MG ) Take as directed on package: start with two-5mg  tablets twice daily  for 7 days. On day 8, switch to one-5mg  tablet twice daily. 74 each 0   esomeprazole (NEXIUM) 20 MG capsule Take 40 mg by mouth daily.     furosemide (LASIX) 20 MG tablet Take 1 tablet (20 mg total) by mouth daily. 20 tablet 3   nitroGLYCERIN (NITROSTAT) 0.4 MG SL tablet Place 1 tablet (0.4 mg total) under the tongue every 5 (five) minutes as needed for chest pain. (Patient not taking: Reported on 02/11/2023) 20 tablet 2   RESTASIS 0.05 % ophthalmic emulsion Place 1 drop into both eyes 2 (two) times daily.     No current facility-administered medications for this visit.     Family History    Family History  Problem Relation Age of Onset   Hiatal hernia Mother    Heart attack Mother 12   Heart disease Father 38       CAD @ autopsy   Cancer Father        renal cancer   Heart attack Brother 28       fatal   Diabetes Neg Hx    Stroke Neg Hx    She indicated that her mother is deceased. She indicated that her father is deceased. She indicated that her brother is deceased. She indicated that the status of her neg hx is unknown.  Social History    Social History   Socioeconomic History   Marital status: Widowed    Spouse name: Not on file   Number of children: 1   Years of education: Not on file   Highest education level: Not on file  Occupational History   Not on file  Tobacco Use   Smoking status: Former    Current packs/day: 0.00    Average packs/day: 0.5 packs/day for 6.0 years (3.0  ttl pk-yrs)    Types: Cigarettes    Start date: 06/30/1956    Quit date: 06/30/1962    Years since quitting: 60.6   Smokeless tobacco: Never   Tobacco comments:    Quit age 64  Vaping Use   Vaping status: Never Used  Substance and Sexual Activity   Alcohol use: Yes    Alcohol/week: 3.0 standard drinks of alcohol    Types: 3 Glasses of wine per week    Comment: wine  : < 3 glasses weekly   Drug use: No   Sexual activity: Yes    Birth control/protection: Post-menopausal, Surgical  Other Topics Concern   Not on file  Social History Narrative   Lives husband and daughter lives with her.    Social Determinants of Health   Financial Resource Strain: Low Risk  (03/12/2021)   [facility] visits prior to 08/30/2022., [facility] visits prior to 08/30/2022.   Overall Financial Resource Strain (CARDIA)    Difficulty of Paying Living Expenses: Not very hard  Food Insecurity: Low Risk  (11/26/2022)   [facility]   Food vital sign    Within the past 12 months, you worried that your food would run out before you got money to buy more: Never true    Within the past 12 months, the food you bought just didn't last and you didn't have money to get more. : Never true  Transportation Needs: No Transportation Needs (11/26/2022)   [facility]   Transportation    In the past 12 months, has lack of reliable transportation kept you from medical  appointments, meetings, work or from getting things needed for daily living? : No  Physical Activity: Inactive (03/12/2021)   [facility] prior to 08/30/2022., [facility] visits prior to 08/30/2022.   Exercise Vital Sign    Days of Exercise per Week: 0 days    Minutes of Exercise per Session: 0 min  Stress: Stress Concern Present (03/12/2021)   [facility]its prior to 08/30/2022., [facility] visits prior to 08/30/2022.   [employer] of Occupational Health - Occupational Stress Questionnaire    Feeling of Stress : Rather much  Social Connections: Moderately Isolated (03/12/2021)   Received [facility] visits prior to 08/30/2022., [facility] visits prior to 08/30/2022.   Social Advertising account executive [NHANES]    Frequency of Communication with Friends and Family: Twice a week    Frequency of Social Gatherings with Friends and Family: Twice a week    Attends Religious Services: 1 to 4 times per year    Active Member of [employer] or Organizations: No    Attends Banker Meetings: Never    Marital Status: Widowed  Intimate Partner Violence: Not At Risk (07/01/2022)   Humiliation, Afraid, Rape, and Kick questionnaire    Fear of Current or Ex-Partner: No    Emotionally Abused: No    Physically Abused: No    Sexually Abused: No     Review of Systems    General:  No chills, fever, night sweats or weight changes.  Cardiovascular: Positive for chest pain, dyspnea on exertion, edema, orthopnea, palpitations, paroxysmal nocturnal dyspnea. Dermatological: No rash, lesions/masses Respiratory: No cough, positive for dyspnea Urologic: No hematuria, dysuria Abdominal:   No nausea, vomiting, diarrhea, bright red blood per rectum, melena, or hematemesis positive for stomach fullness and early satiety Neurologic:  No visual changes, wkns, changes in mental status.  Numbness and tingling in the lower extremities bilaterally All other systems reviewed and are otherwise negative except as noted above.       Physical Exam    VS:  BP 137/63 Comment: dinamap  Pulse 84   Ht 5\' 3"  (1.6 m)   Wt 185 lb (83.9 kg)   SpO2 93%   BMI 32.77 kg/m  , BMI Body mass index is 32.77 kg/m.     GEN: Well nourished, well developed, sitting in wheelchair, complaining of chest  discomfort. HEENT: normal. Neck: Supple, no JVD, carotid bruits, or masses. Cardiac: IRRR, tachycardic, no murmurs, rubs, or gallops. No clubbing, cyanosis, edema.  Radials/DP/PT 2+ and equal bilaterally.  Respiratory:  Respirations regular and unlabored, clear to auscultation bilaterally. GI: Soft, nontender, nondistended, BS + x 4. MS: no deformity or atrophy. Skin: warm and dry, no rash.  Pale Neuro:  Strength and sensation are intact. Psych: Normal affect.      Lab Results  Component Value Date   WBC 8.2 07/17/2022   HGB 12.2 07/17/2022   HCT 38.1 07/17/2022   MCV 93.6 07/17/2022   PLT 223 07/17/2022   Lab Results  Component Value Date   CREATININE 1.10 (H) 07/17/2022   BUN 22 07/17/2022   NA 136 07/17/2022   K 4.2 07/17/2022   CL 105 07/17/2022   CO2 21 (L) 07/17/2022   Lab Results  Component Value Date   ALT 19 07/17/2022   AST 24 07/17/2022   ALKPHOS 37 (L) 07/17/2022  BILITOT 0.7 07/17/2022   Lab Results  Component Value Date   CHOL 208 (H) 07/19/2012   HDL 70.30 07/19/2012   LDLCALC 91 06/13/2011   LDLDIRECT 108.4 07/19/2012   TRIG 152.0 (H) 07/19/2012   CHOLHDL 3 07/19/2012    Lab Results  Component Value Date   HGBA1C 6.1 06/13/2011     Review of Prior Studies    Echocardiogram 06/30/2022  1. Left ventricular ejection fraction, by estimation, is 60 to 65%. The  left ventricle has normal function. The left ventricle has no regional  wall motion abnormalities. There is moderate left ventricular hypertrophy.  Left ventricular diastolic  parameters are consistent with Grade I diastolic dysfunction (impaired  relaxation).   2. Right ventricular systolic function is moderately reduced. The right  ventricular size is normal. There is normal pulmonary artery systolic  pressure. Free wall hypokinesis with normal apical systolic function  consistent with McConnell's sign as can be   seen in acute PE   3. The mitral valve is normal in structure. No  evidence of mitral valve  regurgitation. No evidence of mitral stenosis.   4. The aortic valve is tricuspid. Aortic valve regurgitation is not  visualized. Aortic valve sclerosis is present, with no evidence of aortic  valve stenosis.   Assessment & Plan   1.  New onset A-fib with RVR: Seen today in the office and confirmed by EKG.  Patient has been having complaints of palpitations which have been worsening, with associated shortness of breath and chest pressure.  She did continue to be on Eliquis for history of DVT and PE in the past.  She is now on Eliquis 5 mg twice daily.  Her daughter admits that she sometimes does not take twice daily dosing and only once a day.  I have advised her that she will need to take twice daily dosing indefinitely.  She remains on metoprolol at home, but this has not allowed her to stay in normal sinus rhythm currently.   Due to her symptoms of dyspnea, chest pressure, and A-fib with heart rate of 126 bpm at rest, she will be sent to the ED via EMS where she will be treated and monitored.  I do not wish to wait on the direct admit this Emer[place] that she may decompensate.  I have discussed this with Dr. Lennie Odor, DOD in the office here at [facility] location.  He agrees with my assessment and need to have patient sent to ED.  I have discussed with the patient and her daughter who verbalized understanding.  They are willing to be transported to the hospital for further treatment and management.  2.  Hypertension: Patient's blood pressure is stable currently despite rapid A-fib.  3.  History of pulmonary emboli: Has been on Eliquis 5 mg twice daily.  Daughter who is with her admits that she may only take it once a day.  May need to consider reloading.         Signed, Bettey Mare. Liborio Nixon, ANP, AACC   02/11/2023 4:36 PM      Office [phone] Fax [phone]  Notice: This dictation was prepared with Dragon dictation along with smaller phrase  technology. Any transcriptional errors that result from this process are unintentional and may not be corrected upon review.

## 2023-02-11 ENCOUNTER — Other Ambulatory Visit: Payer: Self-pay

## 2023-02-11 ENCOUNTER — Emergency Department (HOSPITAL_COMMUNITY): Payer: MEDICARE

## 2023-02-11 ENCOUNTER — Encounter (HOSPITAL_COMMUNITY): Payer: Self-pay | Admitting: Internal Medicine

## 2023-02-11 ENCOUNTER — Ambulatory Visit (INDEPENDENT_AMBULATORY_CARE_PROVIDER_SITE_OTHER): Payer: MEDICARE | Admitting: Adult Health

## 2023-02-11 ENCOUNTER — Encounter: Payer: Self-pay | Admitting: Adult Health

## 2023-02-11 ENCOUNTER — Inpatient Hospital Stay (HOSPITAL_COMMUNITY)
Admission: EM | Admit: 2023-02-11 | Discharge: 2023-02-17 | DRG: 308 | Disposition: A | Discharge status: Home Health | Payer: MEDICARE | Attending: Internal Medicine | Admitting: Internal Medicine

## 2023-02-11 VITALS — BP 137/63 | HR 84 | Ht 63.0 in | Wt 185.0 lb

## 2023-02-11 DIAGNOSIS — Z86711 Personal history of pulmonary embolism: Secondary | ICD-10-CM

## 2023-02-11 DIAGNOSIS — I10 Essential (primary) hypertension: Secondary | ICD-10-CM

## 2023-02-11 DIAGNOSIS — Z91040 Latex allergy status: Secondary | ICD-10-CM

## 2023-02-11 DIAGNOSIS — F0390 Unspecified dementia without behavioral disturbance: Secondary | ICD-10-CM | POA: Diagnosis not present

## 2023-02-11 DIAGNOSIS — Z6832 Body mass index (BMI) 32.0-32.9, adult: Secondary | ICD-10-CM

## 2023-02-11 DIAGNOSIS — R600 Localized edema: Secondary | ICD-10-CM | POA: Diagnosis present

## 2023-02-11 DIAGNOSIS — Z86010 Personal history of colonic polyps: Secondary | ICD-10-CM

## 2023-02-11 DIAGNOSIS — R079 Chest pain, unspecified: Secondary | ICD-10-CM

## 2023-02-11 DIAGNOSIS — I4891 Unspecified atrial fibrillation: Secondary | ICD-10-CM

## 2023-02-11 DIAGNOSIS — Z96612 Presence of left artificial shoulder joint: Secondary | ICD-10-CM | POA: Diagnosis present

## 2023-02-11 DIAGNOSIS — Z882 Allergy status to sulfonamides status: Secondary | ICD-10-CM

## 2023-02-11 DIAGNOSIS — E876 Hypokalemia: Secondary | ICD-10-CM | POA: Diagnosis not present

## 2023-02-11 DIAGNOSIS — E039 Hypothyroidism, unspecified: Secondary | ICD-10-CM | POA: Diagnosis present

## 2023-02-11 DIAGNOSIS — Z91199 Patient's noncompliance with other medical treatment and regimen due to unspecified reason: Secondary | ICD-10-CM

## 2023-02-11 DIAGNOSIS — E785 Hyperlipidemia, unspecified: Secondary | ICD-10-CM | POA: Diagnosis present

## 2023-02-11 DIAGNOSIS — R0789 Other chest pain: Secondary | ICD-10-CM | POA: Diagnosis not present

## 2023-02-11 DIAGNOSIS — E669 Obesity, unspecified: Secondary | ICD-10-CM | POA: Diagnosis present

## 2023-02-11 DIAGNOSIS — Z885 Allergy status to narcotic agent status: Secondary | ICD-10-CM

## 2023-02-11 DIAGNOSIS — M7989 Other specified soft tissue disorders: Secondary | ICD-10-CM | POA: Diagnosis present

## 2023-02-11 DIAGNOSIS — Z90710 Acquired absence of both cervix and uterus: Secondary | ICD-10-CM

## 2023-02-11 DIAGNOSIS — K219 Gastro-esophageal reflux disease without esophagitis: Secondary | ICD-10-CM | POA: Diagnosis present

## 2023-02-11 DIAGNOSIS — R0609 Other forms of dyspnea: Secondary | ICD-10-CM | POA: Insufficient documentation

## 2023-02-11 DIAGNOSIS — Z7901 Long term (current) use of anticoagulants: Secondary | ICD-10-CM

## 2023-02-11 DIAGNOSIS — M170 Bilateral primary osteoarthritis of knee: Secondary | ICD-10-CM | POA: Diagnosis present

## 2023-02-11 DIAGNOSIS — R002 Palpitations: Secondary | ICD-10-CM | POA: Diagnosis present

## 2023-02-11 DIAGNOSIS — N179 Acute kidney failure, unspecified: Secondary | ICD-10-CM

## 2023-02-11 DIAGNOSIS — Z886 Allergy status to analgesic agent status: Secondary | ICD-10-CM

## 2023-02-11 DIAGNOSIS — Z96611 Presence of right artificial shoulder joint: Secondary | ICD-10-CM | POA: Diagnosis present

## 2023-02-11 DIAGNOSIS — I4430 Unspecified atrioventricular block: Secondary | ICD-10-CM | POA: Diagnosis present

## 2023-02-11 DIAGNOSIS — I484 Atypical atrial flutter: Secondary | ICD-10-CM | POA: Diagnosis present

## 2023-02-11 DIAGNOSIS — F329 Major depressive disorder, single episode, unspecified: Secondary | ICD-10-CM | POA: Diagnosis present

## 2023-02-11 DIAGNOSIS — Z9851 Tubal ligation status: Secondary | ICD-10-CM

## 2023-02-11 DIAGNOSIS — F0393 Unspecified dementia, unspecified severity, with mood disturbance: Secondary | ICD-10-CM | POA: Diagnosis present

## 2023-02-11 DIAGNOSIS — Z9049 Acquired absence of other specified parts of digestive tract: Secondary | ICD-10-CM

## 2023-02-11 DIAGNOSIS — Z79899 Other long term (current) drug therapy: Secondary | ICD-10-CM

## 2023-02-11 DIAGNOSIS — N1831 Chronic kidney disease, stage 3a: Secondary | ICD-10-CM | POA: Diagnosis present

## 2023-02-11 DIAGNOSIS — I5033 Acute on chronic diastolic (congestive) heart failure: Secondary | ICD-10-CM | POA: Diagnosis present

## 2023-02-11 DIAGNOSIS — Z8249 Family history of ischemic heart disease and other diseases of the circulatory system: Secondary | ICD-10-CM

## 2023-02-11 DIAGNOSIS — Z853 Personal history of malignant neoplasm of breast: Secondary | ICD-10-CM

## 2023-02-11 DIAGNOSIS — I130 Hypertensive heart and chronic kidney disease with heart failure and stage 1 through stage 4 chronic kidney disease, or unspecified chronic kidney disease: Secondary | ICD-10-CM | POA: Diagnosis present

## 2023-02-11 DIAGNOSIS — Z86718 Personal history of other venous thrombosis and embolism: Secondary | ICD-10-CM

## 2023-02-11 DIAGNOSIS — Z87891 Personal history of nicotine dependence: Secondary | ICD-10-CM

## 2023-02-11 DIAGNOSIS — Z9841 Cataract extraction status, right eye: Secondary | ICD-10-CM

## 2023-02-11 DIAGNOSIS — Z9842 Cataract extraction status, left eye: Secondary | ICD-10-CM

## 2023-02-11 DIAGNOSIS — T45516A Underdosing of anticoagulants, initial encounter: Secondary | ICD-10-CM | POA: Diagnosis present

## 2023-02-11 LAB — CBC
HCT: 41 % (ref 36.0–46.0)
Hemoglobin: 13.2 g/dL (ref 12.0–15.0)
MCH: 28.4 pg (ref 26.0–34.0)
MCHC: 32.2 g/dL (ref 30.0–36.0)
MCV: 88.4 fL (ref 80.0–100.0)
Platelets: 306 10*3/uL (ref 150–400)
RBC: 4.64 MIL/uL (ref 3.87–5.11)
RDW: 12.6 % (ref 11.5–15.5)
WBC: 7.4 10*3/uL (ref 4.0–10.5)
nRBC: 0 % (ref 0.0–0.2)

## 2023-02-11 LAB — BRAIN NATRIURETIC PEPTIDE: B Natriuretic Peptide: 202.3 pg/mL — ABNORMAL HIGH (ref 0.0–100.0)

## 2023-02-11 LAB — TSH: TSH: 7.963 u[IU]/mL — ABNORMAL HIGH (ref 0.350–4.500)

## 2023-02-11 LAB — BASIC METABOLIC PANEL
Anion gap: 18 — ABNORMAL HIGH (ref 5–15)
BUN: 12 mg/dL (ref 8–23)
CO2: 22 mmol/L (ref 22–32)
Calcium: 10.1 mg/dL (ref 8.9–10.3)
Chloride: 98 mmol/L (ref 98–111)
Creatinine, Ser: 1.07 mg/dL — ABNORMAL HIGH (ref 0.44–1.00)
GFR, Estimated: 52 mL/min — ABNORMAL LOW (ref >60)
Glucose, Bld: 116 mg/dL — ABNORMAL HIGH (ref 70–99)
Potassium: 3.9 mmol/L (ref 3.5–5.1)
Sodium: 138 mmol/L (ref 135–145)

## 2023-02-11 LAB — TROPONIN I (HIGH SENSITIVITY)
Troponin I (High Sensitivity): 22 ng/L — ABNORMAL HIGH (ref <18)
Troponin I (High Sensitivity): 24 ng/L — ABNORMAL HIGH (ref <18)

## 2023-02-11 MED ORDER — DILTIAZEM LOAD VIA INFUSION
10.0000 mg | Freq: Once | INTRAVENOUS | Status: DC
  Filled 2023-02-11: qty 10

## 2023-02-11 MED ORDER — DILTIAZEM HCL-DEXTROSE 125-5 MG/125ML-% IV SOLN (PREMIX)
5.0000 mg/h | INTRAVENOUS | Status: DC
  Administered 2023-02-11: 5 mg/h via INTRAVENOUS
  Administered 2023-02-12 – 2023-02-14 (×4): 12.5 mg/h via INTRAVENOUS
  Filled 2023-02-11 (×6): qty 125

## 2023-02-11 MED ORDER — POTASSIUM CHLORIDE CRYS ER 20 MEQ PO TBCR
40.0000 meq | EXTENDED_RELEASE_TABLET | Freq: Once | ORAL | Status: AC
  Administered 2023-02-11: 40 meq via ORAL
  Filled 2023-02-11: qty 2

## 2023-02-11 MED ORDER — DILTIAZEM HCL 25 MG/5ML IV SOLN
10.0000 mg | Freq: Once | INTRAVENOUS | Status: AC
  Administered 2023-02-11: 10 mg via INTRAVENOUS
  Filled 2023-02-11: qty 5

## 2023-02-11 MED ORDER — MAGNESIUM SULFATE 2 GM/50ML IV SOLN
2.0000 g | Freq: Once | INTRAVENOUS | Status: AC
  Administered 2023-02-11: 2 g via INTRAVENOUS
  Filled 2023-02-11: qty 50

## 2023-02-11 NOTE — Assessment & Plan Note (Signed)
Pt reports she has been taking Eliquis without missing any doses. Hx of right leg DVT in 06-2022. Check LE U/S. Continue with Eliquis 5 mg bid.

## 2023-02-11 NOTE — ED Triage Notes (Signed)
Patient BIB EMS from Cardiologist appt., Patient c/o palpations and SOB x6wks. New onset of A-fib. Symptoms worsen with movement. !2 lead showed a-fib RVR. 146/88, 120, 97%RA,

## 2023-02-11 NOTE — Progress Notes (Signed)
Called pts daughter Lennox Laity [phone] to make her aware of what room she has been admitted to.

## 2023-02-11 NOTE — Assessment & Plan Note (Signed)
Continue with Eliquis 5 mg bid. 

## 2023-02-11 NOTE — Assessment & Plan Note (Signed)
Stable

## 2023-02-11 NOTE — Assessment & Plan Note (Signed)
Check TSH 

## 2023-02-11 NOTE — Subjective & Objective (Signed)
CC: rapid afib HPI: 82 yo WF with hx of PE/DVT in Jan 2024 on Eliquis, hypertension, hypothyroidism, hyperlipidemia, history of dementia who presents to the ER from cardiology clinic.  Patient was seen in cardiology clinic for follow-up.  She had been previously seen in May 2024.  She is noted to have aortic root enlargement.  During her May 2024 office visit, she was noted to have some volume overload and started on Lasix.  While in the office, she complained of some chest heaviness along with some leg swelling.  EKG was performed which showed rapid A-fib.  She was sent to the ER for evaluation.  On arrival temp 98.5 heart rate 107 blood pressure 185/104 satting 99% on room air.  White count 7.4, hemothirteen 0.2, platelet 306  Sodium 138, potassium 3.9, bicarb 22, BUN of 12, creatinine 1.0, glucose 116  BNP of 202 First set of troponin was 22. Chest x-ray showed no acute cardiopulmonary disease.  Patient given IV Cardizem.  [facility] hospitalist contacted for admission.  Patient states that she is continue to use Eliquis as prescribed.  Does not recall if she has missed any doses.  She states that she gets very short winded with any mild activity.  While in the ER room, she needed to use the bedside commode.  When she stood up to try to transfer back to the bed, heart rate spiked to 155.  IV Cardizem drip started.  Patient complains of chest heaviness with exertion the last 2 weeks.  She complains of some left leg swelling.  Her prior DVT was in her right leg.

## 2023-02-11 NOTE — Assessment & Plan Note (Signed)
Hold diovan while on IV cardizem. Hold lopressor while on IV cardizem.

## 2023-02-11 NOTE — Assessment & Plan Note (Addendum)
Observation telemetry bed. IV cardizem. Replete K. Replete Mg.  Keep K >4.0 and Mag >2.0. check echo. Check TSH.

## 2023-02-11 NOTE — ED Notes (Addendum)
ED TO INPATIENT HANDOFF REPORT  ED Nurse Name and Phone #: Juliette Alcide RN [phone]  S Name/Age/Gender Oneida Arenas 82 y.o. female Room/Bed: 033C/033C  Code Status   Code Status: Full Code  Home/SNF/Other Home Patient oriented to: self, place, time, and situation Is this baseline? Yes   Triage Complete: Triage complete  Chief Complaint Rapid atrial fibrillation [facility]) [I48.91]  Triage Note Patient BIB EMS from Cardiologist appt., Patient c/o palpations and SOB x6wks. New onset of A-fib. Symptoms worsen with movement. !2 lead showed a-fib RVR. 146/88, 120, 97%RA,   Allergies Allergies  Allergen Reactions   Latex Rash   Sulfa Antibiotics Anaphylaxis and Nausea Only   Aspirin Other (See Comments)    GI bleeding   Nsaids Other (See Comments)    GI upset    Oxycodone-Acetaminophen Nausea Only    Level of Care/Admitting Diagnosis ED Disposition     ED Disposition  Admit   Condition  --   Comment  Hospital Area: [facility] [[id number]]  Level of Care: Telemetry Cardiac [103]  May place patient in observation at [facility] or [facility] if equivalent level of care is available:: No  Covid Evaluation: Asymptomatic - no recent exposure (last 10 days) testing not required  Diagnosis: Rapid atrial fibrillation [facility]) [[id number]]  Admitting Physician: Imogene Burn, ERIC [[id number]]  Attending Physician: Imogene Burn, ERIC [[id number]]          B Medical/Surgery History Past Medical History:  Diagnosis Date   Acute deep vein thrombosis (DVT) of proximal vein of right lower extremity (HCC) 07/02/2022   Acute pulmonary embolism (HCC) 07/15/2022   Allergy    latex   Anemia    Anxiety    Arthritis    Bilateral pulmonary embolism (HCC) 06/30/2022   Cancer (HCC) 01/2017   right breast cancer   Depression    Dyslipidemia    Eustachian tube dysfunction    GERD (gastroesophageal reflux disease)    Grade I diastolic dysfunction 07/15/2022   Hx of cardiovascular stress test     Myoview 10/13:  apical thinning, EF 72%, no ischemia   Hx of colonic polyps    Dr Ewing Schlein   Hypertension    Hypothyroidism    Other abnormal glucose    Pulmonary embolism (HCC) 06/30/2022   Past Surgical History:  Procedure Laterality Date   ABDOMINAL HYSTERECTOMY     BSO for uterine fibroids   ACHILLES TENDON SURGERY Right 10/07/2012   Procedure: RIGHT REPAIR RUPTURE TENDON PRIMARY OPEN/PERCUTANEOUS ;  Surgeon: Loreta Ave, MD;  Location: [facility];  Service: Orthopedics;  Laterality: Right;   APPENDECTOMY     BREAST LUMPECTOMY WITH RADIOACTIVE SEED AND SENTINEL LYMPH NODE BIOPSY Right 03/16/2017   Procedure: RIGHT BREAST LUMPECTOMY WITH RADIOACTIVE SEED AND RIGHT AXILLARY SENTINEL  NODE BIOPSY ERAS PATHWAY;  Surgeon: Emelia Loron, MD;  Location: [facility];  Service: General;  Laterality: Right;  PECTORAL BLOCK   CATARACT EXTRACTION, BILATERAL     CHOLECYSTECTOMY     COLONOSCOPY W/ POLYPECTOMY     Dr Ewing Schlein   EAR CYST EXCISION Right 10/07/2012   Procedure: EXCISION BONE CYST BENIGN TUMOR Manuela Neptune ;  Surgeon: Loreta Ave, MD;  Location: [facility];  Service: Orthopedics;  Laterality: Right;   EXPLORATORY LAPAROTOMY     LEFT HEART CATH AND CORONARY ANGIOGRAPHY N/A 08/02/2019   Procedure: LEFT HEART CATH AND CORONARY ANGIOGRAPHY;  Surgeon: Swaziland, Peter M, MD;  Location: [facility];  Service: Cardiovascular;  Laterality: N/A;   REVISION TOTAL SHOULDER TO REVERSE TOTAL SHOULDER Left 01/12/2019   Procedure: REVISION TOTAL SHOULDER TO REVERSE TOTAL SHOULDER;  Surgeon: Bjorn Pippin, MD;  Location: [facility];  Service: Orthopedics;  Laterality: Left;   SINUS SURGERY WITH INSTATRAK     TOTAL SHOULDER REPLACEMENT     Right shoulder   TOTAL SHOULDER REPLACEMENT  05/2010   Left   TUBAL LIGATION       A IV Location/Drains/Wounds Patient Lines/Drains/Airways Status     Active Line/Drains/Airways     Name Placement date  Placement time Site Days   Peripheral IV 02/11/23 20 G Right Antecubital 02/11/23  1751  Antecubital  less than 1            Intake/Output Last 24 hours No intake or output data in the 24 hours ending 02/11/23 2055  Labs/Imaging Results for orders placed or performed during the hospital encounter of 02/11/23 (from the past 48 hour(s))  Basic metabolic panel     Status: Abnormal   Collection Time: 02/11/23  6:20 PM  Result Value Ref Range   Sodium 138 135 - 145 mmol/L   Potassium 3.9 3.5 - 5.1 mmol/L   Chloride 98 98 - 111 mmol/L   CO2 22 22 - 32 mmol/L   Glucose, Bld 116 (H) 70 - 99 mg/dL    Comment: Glucose reference range applies only to samples taken after fasting for at least 8 hours.   BUN 12 8 - 23 mg/dL   Creatinine, Ser 6.96 (H) 0.44 - 1.00 mg/dL   Calcium 29.5 8.9 - 28.4 mg/dL   GFR, Estimated 52 (L) >60 mL/min    Comment: (NOTE) Calculated using the CKD-EPI Creatinine Equation (2021)    Anion gap 18 (H) 5 - 15    Comment: Performed at [facility], [address], [place], [place] [address]  Troponin I (High Sensitivity)     Status: Abnormal   Collection Time: 02/11/23  6:20 PM  Result Value Ref Range   Troponin I (High Sensitivity) 22 (H) <18 ng/L    Comment: (NOTE) Elevated high sensitivity troponin I (hsTnI) values and significant  changes across serial measurements may suggest ACS but many other  chronic and acute conditions are known to elevate hsTnI results.  Refer to the "Links" section for chest pain algorithms and additional  guidance. Performed at [facility], [address] [place], [place] [address]   CBC     Status: None   Collection Time: 02/11/23  6:20 PM  Result Value Ref Range   WBC 7.4 4.0 - 10.5 K/uL   RBC 4.64 3.87 - 5.11 MIL/uL   Hemoglobin 13.2 12.0 - 15.0 g/dL   HCT 25.3 66.4 - 40.3 %   MCV 88.4 80.0 - 100.0 fL   MCH 28.4 26.0 - 34.0 pg   MCHC 32.2 30.0 - 36.0 g/dL   RDW 47.4 25.9 - 56.3 %   Platelets 306 150 - 400  K/uL   nRBC 0.0 0.0 - 0.2 %    Comment: Performed at [facility], [address], [place], [place] [address]  Brain natriuretic peptide     Status: Abnormal   Collection Time: 02/11/23  6:20 PM  Result Value Ref Range   B Natriuretic Peptide 202.3 (H) 0.0 - 100.0 pg/mL    Comment: Performed at [facility], [address], [place], [place] [address]   DG Chest Portable 1  View  Result Date: 02/11/2023 CLINICAL DATA:  Palpitations EXAM: PORTABLE CHEST 1 VIEW COMPARISON:  07/15/2022 FINDINGS: Bilateral shoulder replacements. No acute airspace disease or effusion. Stable cardiomediastinal silhouette with aortic atherosclerosis. IMPRESSION: No active disease. Electronically Signed   By: Jasmine Pang M.D.   On: 02/11/2023 18:58    Pending Labs Unresulted Labs (From admission, onward)     Start     Ordered   02/11/23 2040  TSH  Add-on,   AD        02/11/23 2039   Signed and Held  Comprehensive metabolic panel  Tomorrow morning,   R        Signed and Held   Signed and Held  Magnesium  Tomorrow morning,   R        Signed and Held            Vitals/Pain Today's Vitals   02/11/23 1754 02/11/23 1845 02/11/23 2030 02/11/23 2045  BP:  (!) 166/73 (!) 169/128 (!) 180/90  Pulse:  (!) 108 66 89  Resp:  20 17 18   Temp: 98.5 F (36.9 C)     TempSrc: Oral     SpO2:  100% 98% 98%    Isolation Precautions No active isolations  Medications Medications  diltiazem (CARDIZEM) 125 mg in dextrose 5% 125 mL (1 mg/mL) infusion (has no administration in time range)  potassium chloride SA (KLOR-CON M) CR tablet 40 mEq (has no administration in time range)  magnesium sulfate IVPB 2 g 50 mL (has no administration in time range)  diltiazem (CARDIZEM) injection 10 mg (10 mg Intravenous Given 02/11/23 1823)    Mobility walks with person assist     Focused Assessments Cardiac Assessment Handoff:  Cardiac Rhythm: Atrial fibrillation Lab Results  Component Value Date   CKTOTAL 73  11/22/2011   CKMB 2.2 11/22/2011   TROPONINI <0.30 11/22/2011   Lab Results  Component Value Date   DDIMER 12.88 (H) 06/30/2022   Does the Patient currently have chest pain? No   , Neuro Assessment Handoff:  Swallow screen pass? Yes  Cardiac Rhythm: Atrial fibrillation       Neuro Assessment: Within Defined Limits Neuro Checks:      Has TPA been given? No If patient is a Neuro Trauma and patient is going to OR before floor call report to 4N Charge nurse: [phone] or [phone]   R Recommendations: See Admitting Provider Note  Report given to:   Additional Notes: Hx of dementia but currently A&Ox4. Pt able to use bedside commode with assistance. Continent.

## 2023-02-11 NOTE — ED Provider Notes (Signed)
[facility] Provider Note   CSN: [id number] Arrival date & time: 02/11/23  1716     History  Chief Complaint  Patient presents with   Palpitations    Brenda Stephens is a 82 y.o. female.   Palpitations    Patient has a history of palpitations lower extremity edema chest pain, pulmonary emboli with complaints of volume overload lower extremity edema.  Patient started complaining of palpitation in the office.  Patient noted to be in new onset atrial fibrillation.  Family member indicated the patient has not always been compliant with her Eliquis.  Patient was sent from the cardiologist office to the ED to be admitted to the hospital  Home Medications Prior to Admission medications   Medication Sig Start Date End Date Taking? Authorizing Provider  acetaminophen (TYLENOL) 650 MG CR tablet Take 1 tablet (650 mg total) by mouth every 8 (eight) hours as needed for pain (headaches). Patient taking differently: Take 1,300 mg by mouth every 8 (eight) hours as needed for pain (or headaches). 07/05/22   Rodolph Bong, MD  albuterol (VENTOLIN HFA) 108 (90 Base) MCG/ACT inhaler Inhale 2 puffs into the lungs every 6 (six) hours as needed for wheezing or shortness of breath. 08/25/22   Waymon Budge, MD  apixaban (ELIQUIS) 5 MG TABS tablet Take 1 tablet (5 mg total) by mouth 2 (two) times daily. 08/13/22   Rhetta Mura, MD  APIXABAN Everlene Balls) VTE STARTER PACK (10MG  AND 5MG ) Take as directed on package: start with two-5mg  tablets twice daily for 7 days. On day 8, switch to one-5mg  tablet twice daily. 07/16/22   Rhetta Mura, MD  benzonatate (TESSALON) 200 MG capsule TAKE 1 CAPSULE (200 MG TOTAL) BY MOUTH 3 (THREE) TIMES DAILY AS NEEDED FOR COUGH. 01/27/23   Jetty Duhamel D, MD  Biotin 66440 MCG TABS Take 10,000 mcg by mouth daily.    [provider]  cetirizine (ZYRTEC) 10 MG tablet Take 10 mg by mouth at bedtime.    [provider]  donepezil (ARICEPT) 10 MG tablet Take 10 mg by mouth at bedtime. 12/27/21   [provider]  esomeprazole (NEXIUM) 20 MG capsule Take 40 mg by mouth daily.    [provider]  furosemide (LASIX) 20 MG tablet Take 1 tablet (20 mg total) by mouth daily. 11/07/22 02/05/23  Rollene Rotunda, MD  hydrOXYzine (ATARAX) 25 MG tablet Take 1 tablet (25 mg total) by mouth every 6 (six) hours as needed for anxiety. 07/05/22   Rodolph Bong, MD  meclizine (ANTIVERT) 25 MG tablet Take 1 tablet (25 mg total) by mouth 3 (three) times daily as needed for dizziness. Patient taking differently: Take 25 mg by mouth 3 (three) times daily as needed for dizziness or nausea. 09/20/21   Micheline Maze, MD  memantine (NAMENDA) 10 MG tablet Take 10 mg by mouth 2 (two) times daily. 12/27/21   [provider]  metoprolol succinate (TOPROL XL) 25 MG 24 hr tablet Take 1 tablet (25 mg total) by mouth at bedtime. 11/07/22   Rollene Rotunda, MD  NEXIUM 24HR 20 MG capsule Take 40 mg by mouth daily before breakfast.    [provider]  nitroGLYCERIN (NITROSTAT) 0.4 MG SL tablet Place 1 tablet (0.4 mg total) under the tongue every 5 (five) minutes as needed for chest pain. Patient not taking: Reported on 02/11/2023 07/05/22   Rodolph Bong, MD  Olopatadine HCl (PATADAY OP) Place 1 drop  into both eyes daily as needed (for allergies).    [provider]  oxybutynin (DITROPAN) 5 MG tablet Take 5 mg by mouth 2 (two) times daily.    [provider]  Polyethyl Glycol-Propyl Glycol (SYSTANE OP) Place 1 drop into both eyes 2 (two) times daily as needed (for dryness).    [provider]  QUEtiapine (SEROQUEL) 50 MG tablet Take 50 mg by mouth at bedtime. 04/22/22   [provider]  RESTASIS 0.05 % ophthalmic emulsion Place 1 drop into both eyes 2 (two) times daily.    [provider]  sennosides-docusate sodium (SENOKOT-S) 8.6-50 MG tablet Take 1 tablet  by mouth daily as needed for constipation.    [provider]  tamsulosin (FLOMAX) 0.4 MG CAPS capsule Take 0.4 mg by mouth at bedtime.    [provider]  valsartan (DIOVAN) 80 MG tablet Take 1 tablet (80 mg total) by mouth daily. 11/07/22   Rollene Rotunda, MD  venlafaxine XR (EFFEXOR-XR) 150 MG 24 hr capsule TAKE 1 CAPSULE (150 MG TOTAL) BY MOUTH DAILY. Patient taking differently: Take 150 mg by mouth daily. 12/14/12   Pecola Lawless, MD      Allergies    Latex, Sulfa antibiotics, Aspirin, Nsaids, and Oxycodone-acetaminophen    Review of Systems   Review of Systems  Cardiovascular:  Positive for palpitations.    Physical Exam Updated Vital Signs BP (!) 166/73   Pulse (!) 108   Temp 98.5 F (36.9 C) (Oral)   Resp 20   SpO2 100%  Physical Exam Vitals and nursing note reviewed.  Constitutional:      Appearance: She is well-developed. She is not diaphoretic.  HENT:     Head: Normocephalic and atraumatic.     Right Ear: External ear normal.     Left Ear: External ear normal.  Eyes:     General: No scleral icterus.       Right eye: No discharge.        Left eye: No discharge.     Conjunctiva/sclera: Conjunctivae normal.  Neck:     Trachea: No tracheal deviation.  Cardiovascular:     Rate and Rhythm: Tachycardia present. Rhythm irregular.  Pulmonary:     Effort: Pulmonary effort is normal. No respiratory distress.     Breath sounds: Normal breath sounds. No stridor. No wheezing or rales.  Abdominal:     General: Bowel sounds are normal. There is no distension.     Palpations: Abdomen is soft.     Tenderness: There is no abdominal tenderness. There is no guarding or rebound.  Musculoskeletal:        General: No tenderness or deformity.     Cervical back: Neck supple.     Right lower leg: Edema present.     Left lower leg: Edema present.  Skin:    General: Skin is warm and dry.     Findings: No rash.  Neurological:     General: No focal deficit  present.     Mental Status: She is alert.     Cranial Nerves: No cranial nerve deficit, dysarthria or facial asymmetry.     Sensory: No sensory deficit.     Motor: No abnormal muscle tone or seizure activity.     Coordination: Coordination normal.  Psychiatric:        Mood and Affect: Mood normal.     ED Results / Procedures / Treatments   Labs (all labs ordered are listed, but only  abnormal results are displayed) Labs Reviewed  BASIC METABOLIC PANEL - Abnormal; Notable for the following components:      Result Value   Glucose, Bld 116 (*)    Creatinine, Ser 1.07 (*)    GFR, Estimated 52 (*)    Anion gap 18 (*)    All other components within normal limits  BRAIN NATRIURETIC PEPTIDE - Abnormal; Notable for the following components:   B Natriuretic Peptide 202.3 (*)    All other components within normal limits  TROPONIN I (HIGH SENSITIVITY) - Abnormal; Notable for the following components:   Troponin I (High Sensitivity) 22 (*)    All other components within normal limits  CBC  TROPONIN I (HIGH SENSITIVITY)    EKG None  Radiology DG Chest Portable 1 View  Result Date: 02/11/2023 CLINICAL DATA:  Palpitations EXAM: PORTABLE CHEST 1 VIEW COMPARISON:  07/15/2022 FINDINGS: Bilateral shoulder replacements. No acute airspace disease or effusion. Stable cardiomediastinal silhouette with aortic atherosclerosis. IMPRESSION: No active disease. Electronically Signed   By: Jasmine Pang M.D.   On: 02/11/2023 18:58    Procedures Procedures    Medications Ordered in ED Medications  diltiazem (CARDIZEM) 1 mg/mL load via infusion 10 mg (has no administration in time range)    And  diltiazem (CARDIZEM) 125 mg in dextrose 5% 125 mL (1 mg/mL) infusion (has no administration in time range)  potassium chloride SA (KLOR-CON M) CR tablet 40 mEq (has no administration in time range)  magnesium sulfate IVPB 2 g 50 mL (has no administration in time range)  diltiazem (CARDIZEM) injection 10 mg  (10 mg Intravenous Given 02/11/23 1823)    ED Course/ Medical Decision Making/ A&P Clinical Course as of 02/11/23 2027  Wed Feb 11, 2023  2003 D/w Dr Anne Fu.  OK for medical admission, cardiology will see [JK]  2018 Labs reviewed.  Troponin and BNP slightly elevated.  Otherwise no significant metabolic abnormalities.  Chest x-ray without signs of pulmonary edema [JK]  2018 Case is discussed with Dr. Imogene Burn regarding admission [JK]    Clinical Course User Index [JK] Linwood Dibbles, MD         CHA2DS2-VASc Score: 5                        Medical Decision Making Problems Addressed: Atrial fibrillation with rapid ventricular response [facility]): acute illness or injury that poses a threat to life or bodily functions  Amount and/or Complexity of Data Reviewed Labs: ordered. Decision-making details documented in ED Course. Radiology: ordered and independent interpretation performed. ECG/medicine tests: ordered.  Risk Prescription drug management. Decision regarding hospitalization.   Patient presented to ED for evaluation of shortness of breath in the setting of new onset atrial fibrillation.  Patient seen in the cardiology office today.  Recommendation was for admission to the hospital.  No signs of any acute laboratory abnormalities.  Patient noted to be in atrial fibrillation.  She does take Eliquis but she has not been compliant with her regular dosing.  Not a candidate for elective cardioversion for this reason.  She was given a dose of IV Cardizem.  Case discussed with Dr. Anne Fu cardiology.  Cardiology service will evaluate patient in consultation.  Discussed the case with Dr. Imogene Burn and the patient will be admitted to the hospital for further management and treatment.        Final Clinical Impression(s) / ED Diagnoses Final diagnoses:  Atrial fibrillation with rapid ventricular response (HCC)  Rx / DC Orders ED Discharge Orders     None         Linwood Dibbles, MD 02/11/23  2028

## 2023-02-11 NOTE — Assessment & Plan Note (Signed)
Continue with aricept and namenda.

## 2023-02-11 NOTE — H&P (Signed)
History and Physical    Brenda Stephens:[id number] DOB: 1941/03/11 DOA: 02/11/2023  DOS: the patient was seen and examined on 02/11/2023  PCP: Verlee Rossetti, PA-C   Patient coming from: Clinic  I have personally briefly reviewed patient's old medical records in [facility]  CC: rapid afib HPI: 82 yo WF with hx of PE/DVT in Jan 2024 on Eliquis, hypertension, hypothyroidism, hyperlipidemia, history of dementia who presents to the ER from cardiology clinic.  Patient was seen in cardiology clinic for follow-up.  She had been previously seen in May 2024.  She is noted to have aortic root enlargement.  During her May 2024 office visit, she was noted to have some volume overload and started on Lasix.  While in the office, she complained of some chest heaviness along with some leg swelling.  EKG was performed which showed rapid A-fib.  She was sent to the ER for evaluation.  On arrival temp 98.5 heart rate 107 blood pressure 185/104 satting 99% on room air.  White count 7.4, hemothirteen 0.2, platelet 306  Sodium 138, potassium 3.9, bicarb 22, BUN of 12, creatinine 1.0, glucose 116  BNP of 202 First set of troponin was 22. Chest x-ray showed no acute cardiopulmonary disease.  Patient given IV Cardizem.  [facility] contacted for admission.  Patient states that she is continue to use Eliquis as prescribed.  Does not recall if she has missed any doses.  She states that she gets very short winded with any mild activity.  While in the ER room, she needed to use the bedside commode.  When she stood up to try to transfer back to the bed, heart rate spiked to 155.  IV Cardizem drip started.  Patient complains of chest heaviness with exertion the last 2 weeks.  She complains of some left leg swelling.  Her prior DVT was in her right leg.    ED Course: noted to be in rapid afib in ER. Given IV cardizem.  Review of Systems:  Review of Systems  Constitutional: Negative.   HENT:  Negative.    Eyes: Negative.   Respiratory:  Positive for shortness of breath.   Cardiovascular:  Positive for leg swelling.       Exertional chest heaviness for 2 weeks.  Gastrointestinal: Negative.   Genitourinary: Negative.   Musculoskeletal: Negative.   Skin: Negative.   Neurological: Negative.   Endo/Heme/Allergies: Negative.   Psychiatric/Behavioral: Negative.    All other systems reviewed and are negative.   Past Medical History:  Diagnosis Date   Acute deep vein thrombosis (DVT) of proximal vein of right lower extremity (HCC) 07/02/2022   Acute pulmonary embolism (HCC) 07/15/2022   Allergy    latex   Anemia    Anxiety    Arthritis    Bilateral pulmonary embolism (HCC) 06/30/2022   Cancer (HCC) 01/2017   right breast cancer   Depression    Dyslipidemia    Eustachian tube dysfunction    GERD (gastroesophageal reflux disease)    Grade I diastolic dysfunction 07/15/2022   Hx of cardiovascular stress test    Myoview 10/13:  apical thinning, EF 72%, no ischemia   Hx of colonic polyps    Dr Ewing Schlein   Hypertension    Hypothyroidism    Other abnormal glucose    Pulmonary embolism (HCC) 06/30/2022    Past Surgical History:  Procedure Laterality Date   ABDOMINAL HYSTERECTOMY     BSO for uterine fibroids   ACHILLES TENDON  SURGERY Right 10/07/2012   Procedure: RIGHT REPAIR RUPTURE TENDON PRIMARY OPEN/PERCUTANEOUS ;  Surgeon: Loreta Ave, MD;  Location: [facility];  Service: Orthopedics;  Laterality: Right;   APPENDECTOMY     BREAST LUMPECTOMY WITH RADIOACTIVE SEED AND SENTINEL LYMPH NODE BIOPSY Right 03/16/2017   Procedure: RIGHT BREAST LUMPECTOMY WITH RADIOACTIVE SEED AND RIGHT AXILLARY SENTINEL  NODE BIOPSY ERAS PATHWAY;  Surgeon: Emelia Loron, MD;  Location: [facility];  Service: General;  Laterality: Right;  PECTORAL BLOCK   CATARACT EXTRACTION, BILATERAL     CHOLECYSTECTOMY     COLONOSCOPY W/ POLYPECTOMY     Dr Ewing Schlein   EAR  CYST EXCISION Right 10/07/2012   Procedure: EXCISION BONE CYST BENIGN TUMOR Manuela Neptune ;  Surgeon: Loreta Ave, MD;  Location: [facility];  Service: Orthopedics;  Laterality: Right;   EXPLORATORY LAPAROTOMY     LEFT HEART CATH AND CORONARY ANGIOGRAPHY N/A 08/02/2019   Procedure: LEFT HEART CATH AND CORONARY ANGIOGRAPHY;  Surgeon: Swaziland, Peter M, MD;  Location: [facility] INVASIVE CV LAB;  Service: Cardiovascular;  Laterality: N/A;   REVISION TOTAL SHOULDER TO REVERSE TOTAL SHOULDER Left 01/12/2019   Procedure: REVISION TOTAL SHOULDER TO REVERSE TOTAL SHOULDER;  Surgeon: Bjorn Pippin, MD;  Location: [facility];  Service: Orthopedics;  Laterality: Left;   SINUS SURGERY WITH INSTATRAK     TOTAL SHOULDER REPLACEMENT     Right shoulder   TOTAL SHOULDER REPLACEMENT  05/2010   Left   TUBAL LIGATION       reports that she quit smoking about 60 years ago. Her smoking use included cigarettes. She started smoking about 66 years ago. She has a 3 pack-year smoking history. She has never used smokeless tobacco. She reports current alcohol use of about 3.0 standard drinks of alcohol per week. She reports that she does not use drugs.  Allergies  Allergen Reactions   Latex Rash   Sulfa Antibiotics Anaphylaxis and Nausea Only   Aspirin Other (See Comments)    GI bleeding   Nsaids Other (See Comments)    GI upset    Oxycodone-Acetaminophen Nausea Only    Family History  Problem Relation Age of Onset   Hiatal hernia Mother    Heart attack Mother 80   Heart disease Father 11       CAD @ autopsy   Cancer Father        renal cancer   Heart attack Brother 7       fatal   Diabetes Neg Hx    Stroke Neg Hx     Prior to Admission medications   Medication Sig Start Date End Date Taking? Authorizing Provider  acetaminophen (TYLENOL) 650 MG CR tablet Take 1 tablet (650 mg total) by mouth every 8 (eight) hours as needed for pain (headaches). Patient taking differently: Take 1,300 mg by mouth  every 8 (eight) hours as needed for pain (or headaches). 07/05/22   Rodolph Bong, MD  albuterol (VENTOLIN HFA) 108 (90 Base) MCG/ACT inhaler Inhale 2 puffs into the lungs every 6 (six) hours as needed for wheezing or shortness of breath. 08/25/22   Waymon Budge, MD  apixaban (ELIQUIS) 5 MG TABS tablet Take 1 tablet (5 mg total) by mouth 2 (two) times daily. 08/13/22   Rhetta Mura, MD  APIXABAN Everlene Balls) VTE STARTER PACK (10MG  AND 5MG ) Take as directed on package: start with two-5mg  tablets twice daily for 7 days. On day 8, switch to one-5mg   tablet twice daily. 07/16/22   Rhetta Mura, MD  benzonatate (TESSALON) 200 MG capsule TAKE 1 CAPSULE (200 MG TOTAL) BY MOUTH 3 (THREE) TIMES DAILY AS NEEDED FOR COUGH. 01/27/23   Jetty Duhamel D, MD  Biotin 81191 MCG TABS Take 10,000 mcg by mouth daily.    [provider]  cetirizine (ZYRTEC) 10 MG tablet Take 10 mg by mouth at bedtime.    [provider]  donepezil (ARICEPT) 10 MG tablet Take 10 mg by mouth at bedtime. 12/27/21   [provider]  esomeprazole (NEXIUM) 20 MG capsule Take 40 mg by mouth daily.    [provider]  furosemide (LASIX) 20 MG tablet Take 1 tablet (20 mg total) by mouth daily. 11/07/22 02/05/23  Rollene Rotunda, MD  hydrOXYzine (ATARAX) 25 MG tablet Take 1 tablet (25 mg total) by mouth every 6 (six) hours as needed for anxiety. 07/05/22   Rodolph Bong, MD  meclizine (ANTIVERT) 25 MG tablet Take 1 tablet (25 mg total) by mouth 3 (three) times daily as needed for dizziness. Patient taking differently: Take 25 mg by mouth 3 (three) times daily as needed for dizziness or nausea. 09/20/21   Micheline Maze, MD  memantine (NAMENDA) 10 MG tablet Take 10 mg by mouth 2 (two) times daily. 12/27/21   [provider]  metoprolol succinate (TOPROL XL) 25 MG 24 hr tablet Take 1 tablet (25 mg total) by mouth at bedtime. 11/07/22   Rollene Rotunda, MD  NEXIUM 24HR 20 MG capsule Take 40 mg by  mouth daily before breakfast.    [provider]  nitroGLYCERIN (NITROSTAT) 0.4 MG SL tablet Place 1 tablet (0.4 mg total) under the tongue every 5 (five) minutes as needed for chest pain. Patient not taking: Reported on 02/11/2023 07/05/22   Rodolph Bong, MD  Olopatadine HCl (PATADAY OP) Place 1 drop into both eyes daily as needed (for allergies).    [provider]  oxybutynin (DITROPAN) 5 MG tablet Take 5 mg by mouth 2 (two) times daily.    [provider]  Polyethyl Glycol-Propyl Glycol (SYSTANE OP) Place 1 drop into both eyes 2 (two) times daily as needed (for dryness).    [provider]  QUEtiapine (SEROQUEL) 50 MG tablet Take 50 mg by mouth at bedtime. 04/22/22   [provider]  RESTASIS 0.05 % ophthalmic emulsion Place 1 drop into both eyes 2 (two) times daily.    [provider]  sennosides-docusate sodium (SENOKOT-S) 8.6-50 MG tablet Take 1 tablet by mouth daily as needed for constipation.    [provider]  tamsulosin (FLOMAX) 0.4 MG CAPS capsule Take 0.4 mg by mouth at bedtime.    [provider]  valsartan (DIOVAN) 80 MG tablet Take 1 tablet (80 mg total) by mouth daily. 11/07/22   Rollene Rotunda, MD  venlafaxine XR (EFFEXOR-XR) 150 MG 24 hr capsule TAKE 1 CAPSULE (150 MG TOTAL) BY MOUTH DAILY. Patient taking differently: Take 150 mg by mouth daily. 12/14/12   Pecola Lawless, MD    Physical Exam: Vitals:   02/11/23 1745 02/11/23 1754 02/11/23 1845 02/11/23 2030  BP:   (!) 166/73 (!) 169/128  Pulse: 92  (!) 108 66  Resp: (!) 21  20 17   Temp:  98.5 F (36.9 C)    TempSrc:  Oral    SpO2: 98%  100% 98%    Physical Exam Vitals and nursing note reviewed.  Constitutional:      General: She is  not in acute distress.    Appearance: She is obese. She is not toxic-appearing or diaphoretic.  HENT:     Head: Normocephalic and atraumatic.     Nose: Nose normal.  Eyes:     General: No scleral  icterus. Cardiovascular:     Rate and Rhythm: Tachycardia present. Rhythm irregular.     Pulses:          Dorsalis pedis pulses are 1+ on the right side and 1+ on the left side.  Pulmonary:     Effort: Pulmonary effort is normal. No respiratory distress.     Breath sounds: Normal breath sounds. No wheezing or rales.  Abdominal:     General: Abdomen is protuberant. Bowel sounds are normal. There is no distension.     Palpations: Abdomen is soft.     Tenderness: There is no abdominal tenderness.  Musculoskeletal:     Comments: Mild edema left lower leg. Non-pitting. No left ankle or pedal edema  Skin:    General: Skin is warm and dry.     Capillary Refill: Capillary refill takes less than 2 seconds.  Neurological:     General: No focal deficit present.     Mental Status: She is alert and oriented to person, place, and time.      Labs on Admission: I have personally reviewed following labs and imaging studies  CBC: Recent Labs  Lab 02/11/23 1820  WBC 7.4  HGB 13.2  HCT 41.0  MCV 88.4  PLT 306   Basic Metabolic Panel: Recent Labs  Lab 02/11/23 1820  NA 138  K 3.9  CL 98  CO2 22  GLUCOSE 116*  BUN 12  CREATININE 1.07*  CALCIUM 10.1   GFR: Estimated Creatinine Clearance: 42.3 mL/min (A) (by C-G formula based on SCr of 1.07 mg/dL (H)).  Cardiac Enzymes: Recent Labs  Lab 02/11/23 1820  TROPONINIHS 22*   BNP (last 3 results) Recent Labs    06/30/22 0021 02/11/23 1820  BNP 284.4* 202.3*   Radiological Exams on Admission: I have personally reviewed images DG Chest Portable 1 View  Result Date: 02/11/2023 CLINICAL DATA:  Palpitations EXAM: PORTABLE CHEST 1 VIEW COMPARISON:  07/15/2022 FINDINGS: Bilateral shoulder replacements. No acute airspace disease or effusion. Stable cardiomediastinal silhouette with aortic atherosclerosis. IMPRESSION: No active disease. Electronically Signed   By: Jasmine Pang M.D.   On: 02/11/2023 18:58    EKG: My personal  interpretation of EKG shows: rapid aflutter    Assessment/Plan Principal Problem:   Rapid atrial fibrillation (HCC) Active Problems:   Leg swelling   Hypothyroidism   Hyperlipidemia   Essential hypertension   Dementia without behavioral disturbance (HCC)   History of pulmonary embolism   History of DVT of lower extremity    Assessment and Plan: * Rapid atrial fibrillation (HCC) Observation telemetry bed. IV cardizem. Replete K. Replete Mg.  Keep K >4.0 and Mag >2.0. check echo. Check TSH.  Leg swelling Pt reports she has been taking Eliquis without missing any doses. Hx of right leg DVT in 06-2022. Check LE U/S. Continue with Eliquis 5 mg bid.  History of DVT of lower extremity Continue with Eliquis 5 mg bid  History of pulmonary embolism Continue with Eliquis 5 mg bid  Dementia without behavioral disturbance (HCC) Continue with aricept and namenda.  Essential hypertension Hold diovan while on IV cardizem. Hold lopressor while on IV cardizem.  Hyperlipidemia Stable.  Hypothyroidism Check TSH.   DVT prophylaxis: Eliquis Code Status: Full Code Family  Communication: no family at bedside  Disposition Plan: return home  Consults called: EDP has consulted cardiology([place])  Admission status: Observation, Telemetry bed   Carollee Herter, DO [facility] 02/11/2023, 8:40 PM

## 2023-02-12 ENCOUNTER — Observation Stay (HOSPITAL_COMMUNITY): Payer: MEDICARE

## 2023-02-12 ENCOUNTER — Encounter (HOSPITAL_COMMUNITY): Payer: Self-pay | Admitting: Internal Medicine

## 2023-02-12 ENCOUNTER — Observation Stay (HOSPITAL_BASED_OUTPATIENT_CLINIC_OR_DEPARTMENT_OTHER): Payer: MEDICARE

## 2023-02-12 DIAGNOSIS — E785 Hyperlipidemia, unspecified: Secondary | ICD-10-CM | POA: Diagnosis present

## 2023-02-12 DIAGNOSIS — R600 Localized edema: Secondary | ICD-10-CM | POA: Diagnosis not present

## 2023-02-12 DIAGNOSIS — N179 Acute kidney failure, unspecified: Secondary | ICD-10-CM | POA: Diagnosis not present

## 2023-02-12 DIAGNOSIS — M170 Bilateral primary osteoarthritis of knee: Secondary | ICD-10-CM | POA: Diagnosis present

## 2023-02-12 DIAGNOSIS — Z79899 Other long term (current) drug therapy: Secondary | ICD-10-CM | POA: Diagnosis not present

## 2023-02-12 DIAGNOSIS — Z96611 Presence of right artificial shoulder joint: Secondary | ICD-10-CM | POA: Diagnosis present

## 2023-02-12 DIAGNOSIS — K219 Gastro-esophageal reflux disease without esophagitis: Secondary | ICD-10-CM | POA: Diagnosis present

## 2023-02-12 DIAGNOSIS — E876 Hypokalemia: Secondary | ICD-10-CM | POA: Diagnosis not present

## 2023-02-12 DIAGNOSIS — I130 Hypertensive heart and chronic kidney disease with heart failure and stage 1 through stage 4 chronic kidney disease, or unspecified chronic kidney disease: Secondary | ICD-10-CM | POA: Diagnosis not present

## 2023-02-12 DIAGNOSIS — E039 Hypothyroidism, unspecified: Secondary | ICD-10-CM | POA: Diagnosis not present

## 2023-02-12 DIAGNOSIS — I5030 Unspecified diastolic (congestive) heart failure: Secondary | ICD-10-CM | POA: Diagnosis not present

## 2023-02-12 DIAGNOSIS — Z96612 Presence of left artificial shoulder joint: Secondary | ICD-10-CM | POA: Diagnosis present

## 2023-02-12 DIAGNOSIS — F0390 Unspecified dementia without behavioral disturbance: Secondary | ICD-10-CM | POA: Diagnosis not present

## 2023-02-12 DIAGNOSIS — I5033 Acute on chronic diastolic (congestive) heart failure: Secondary | ICD-10-CM | POA: Diagnosis not present

## 2023-02-12 DIAGNOSIS — I4892 Unspecified atrial flutter: Secondary | ICD-10-CM

## 2023-02-12 DIAGNOSIS — M7989 Other specified soft tissue disorders: Secondary | ICD-10-CM | POA: Diagnosis not present

## 2023-02-12 DIAGNOSIS — I4891 Unspecified atrial fibrillation: Secondary | ICD-10-CM | POA: Diagnosis not present

## 2023-02-12 DIAGNOSIS — I10 Essential (primary) hypertension: Secondary | ICD-10-CM | POA: Diagnosis not present

## 2023-02-12 DIAGNOSIS — F329 Major depressive disorder, single episode, unspecified: Secondary | ICD-10-CM | POA: Diagnosis not present

## 2023-02-12 DIAGNOSIS — T45516A Underdosing of anticoagulants, initial encounter: Secondary | ICD-10-CM | POA: Diagnosis present

## 2023-02-12 DIAGNOSIS — R002 Palpitations: Secondary | ICD-10-CM | POA: Diagnosis present

## 2023-02-12 DIAGNOSIS — F0393 Unspecified dementia, unspecified severity, with mood disturbance: Secondary | ICD-10-CM | POA: Diagnosis not present

## 2023-02-12 DIAGNOSIS — Z886 Allergy status to analgesic agent status: Secondary | ICD-10-CM | POA: Diagnosis not present

## 2023-02-12 DIAGNOSIS — N1831 Chronic kidney disease, stage 3a: Secondary | ICD-10-CM | POA: Diagnosis not present

## 2023-02-12 DIAGNOSIS — E669 Obesity, unspecified: Secondary | ICD-10-CM | POA: Diagnosis not present

## 2023-02-12 DIAGNOSIS — I4430 Unspecified atrioventricular block: Secondary | ICD-10-CM | POA: Diagnosis not present

## 2023-02-12 DIAGNOSIS — I484 Atypical atrial flutter: Secondary | ICD-10-CM | POA: Diagnosis not present

## 2023-02-12 DIAGNOSIS — Z87891 Personal history of nicotine dependence: Secondary | ICD-10-CM | POA: Diagnosis not present

## 2023-02-12 HISTORY — DX: Unspecified atrial fibrillation: I48.91

## 2023-02-12 LAB — COMPREHENSIVE METABOLIC PANEL
ALT: 26 U/L (ref 0–44)
AST: 28 U/L (ref 15–41)
Albumin: 3.6 g/dL (ref 3.5–5.0)
Alkaline Phosphatase: 51 U/L (ref 38–126)
Anion gap: 12 (ref 5–15)
BUN: 10 mg/dL (ref 8–23)
CO2: 20 mmol/L — ABNORMAL LOW (ref 22–32)
Calcium: 9.4 mg/dL (ref 8.9–10.3)
Chloride: 103 mmol/L (ref 98–111)
Creatinine, Ser: 1.14 mg/dL — ABNORMAL HIGH (ref 0.44–1.00)
GFR, Estimated: 48 mL/min — ABNORMAL LOW (ref >60)
Glucose, Bld: 150 mg/dL — ABNORMAL HIGH (ref 70–99)
Potassium: 4 mmol/L (ref 3.5–5.1)
Sodium: 135 mmol/L (ref 135–145)
Total Bilirubin: 0.8 mg/dL (ref 0.3–1.2)
Total Protein: 6.9 g/dL (ref 6.5–8.1)

## 2023-02-12 LAB — ECHOCARDIOGRAM COMPLETE
AR max vel: 3.23 cm2
AV Peak grad: 7.5 mmHg
Ao pk vel: 1.37 m/s
Area-P 1/2: 4.11 cm2
Calc EF: 66.3 %
Height: 63 in
S' Lateral: 2.7 cm
Single Plane A2C EF: 65 %
Single Plane A4C EF: 65 %
Weight: 3033.53 oz

## 2023-02-12 LAB — MAGNESIUM: Magnesium: 2.1 mg/dL (ref 1.7–2.4)

## 2023-02-12 MED ORDER — PERFLUTREN LIPID MICROSPHERE
1.0000 mL | INTRAVENOUS | Status: AC | PRN
  Administered 2023-02-12: 4 mL via INTRAVENOUS

## 2023-02-12 MED ORDER — FUROSEMIDE 10 MG/ML IJ SOLN
40.0000 mg | Freq: Two times a day (BID) | INTRAMUSCULAR | Status: AC
  Administered 2023-02-12 – 2023-02-13 (×3): 40 mg via INTRAVENOUS
  Filled 2023-02-12 (×3): qty 4

## 2023-02-12 MED ORDER — METOPROLOL SUCCINATE ER 25 MG PO TB24
25.0000 mg | ORAL_TABLET | Freq: Every day | ORAL | Status: DC
  Administered 2023-02-12 – 2023-02-13 (×2): 25 mg via ORAL
  Filled 2023-02-12 (×2): qty 1

## 2023-02-12 MED ORDER — ACETAMINOPHEN 325 MG PO TABS
650.0000 mg | ORAL_TABLET | Freq: Four times a day (QID) | ORAL | Status: DC | PRN
  Administered 2023-02-12 – 2023-02-17 (×10): 650 mg via ORAL
  Filled 2023-02-12 (×11): qty 2

## 2023-02-12 MED ORDER — ACETAMINOPHEN 650 MG RE SUPP: 650.0000 mg | Freq: Four times a day (QID) | RECTAL | Status: DC | PRN

## 2023-02-12 MED ORDER — ONDANSETRON HCL 4 MG/2ML IJ SOLN
4.0000 mg | Freq: Four times a day (QID) | INTRAMUSCULAR | Status: DC | PRN
  Administered 2023-02-13 – 2023-02-17 (×4): 4 mg via INTRAVENOUS
  Filled 2023-02-12 (×5): qty 2

## 2023-02-12 MED ORDER — MEMANTINE HCL 10 MG PO TABS
10.0000 mg | ORAL_TABLET | Freq: Two times a day (BID) | ORAL | Status: DC
  Administered 2023-02-12 – 2023-02-17 (×12): 10 mg via ORAL
  Filled 2023-02-12 (×12): qty 1

## 2023-02-12 MED ORDER — MELATONIN 5 MG PO TABS
10.0000 mg | ORAL_TABLET | Freq: Every evening | ORAL | Status: DC | PRN
  Administered 2023-02-12 – 2023-02-15 (×2): 10 mg via ORAL
  Filled 2023-02-12 (×2): qty 2

## 2023-02-12 MED ORDER — APIXABAN 5 MG PO TABS
5.0000 mg | ORAL_TABLET | Freq: Two times a day (BID) | ORAL | Status: DC
  Administered 2023-02-12 – 2023-02-17 (×12): 5 mg via ORAL
  Filled 2023-02-12 (×12): qty 1

## 2023-02-12 MED ORDER — DONEPEZIL HCL 10 MG PO TABS
10.0000 mg | ORAL_TABLET | Freq: Every day | ORAL | Status: DC
  Administered 2023-02-12 – 2023-02-16 (×6): 10 mg via ORAL
  Filled 2023-02-12 (×6): qty 1

## 2023-02-12 MED ORDER — PANTOPRAZOLE SODIUM 40 MG PO TBEC
40.0000 mg | DELAYED_RELEASE_TABLET | Freq: Every day | ORAL | Status: DC
  Administered 2023-02-12 – 2023-02-17 (×6): 40 mg via ORAL
  Filled 2023-02-12 (×6): qty 1

## 2023-02-12 MED ORDER — ONDANSETRON HCL 4 MG PO TABS: 4.0000 mg | ORAL_TABLET | Freq: Four times a day (QID) | ORAL | Status: DC | PRN

## 2023-02-12 NOTE — Plan of Care (Signed)

## 2023-02-12 NOTE — Consult Note (Signed)
Cardiology Consultation   Patient ID: Brenda Stephens MRN: [id number]; DOB: Dec 29, 1940  Admit date: 02/11/2023 Date of Consult: 02/12/2023  PCP:  Verlee Rossetti, PA-C   [facility] Providers Cardiologist:  Rollene Rotunda, MD     Patient Profile:   Brenda Stephens is a 82 y.o. 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  History of Present Illness:   Brenda Stephens is an 82 year old female with past medical history noted above.  She has been followed by Dr. Antoine Poche as an outpatient.  She underwent cardiac catheterization in 2021 in the setting of chest pain and found to have normal coronary arteries.  Echocardiogram showed well-preserved LVEF, aortic root enlargement of 40 mm.  Seen in the office with Dr. Antoine Poche on 5/10 and was found to be volume overloaded with lower extremity edema.  She was given Lasix 20 mg for 4 to 5 days with her Diovan reduced and continued on metoprolol 25 mg daily.  She was seen in the office yesterday with Joni Reining, NP for shortness of breath and found to be in new onset atrial fibrillation with RVR.  She complained of dyspnea, chest pressure and palpitations.  Given her symptoms she was sent to the ED for further evaluation.  In the ED her labs showed sodium 138, potassium 3.9, creatinine 1.07, BNP 202, high-sensitivity troponin 22>>24, BBC 7.4, hemoglobin 13.2, TSH 7.9.  Chest x-ray negative.  EKG showed atrial flutter, variable AV block, 126 bpm.  He was placed on IV Cardizem and continued on Eliquis.  She was admitted to medicine for further evaluation.  Cardiology now asked to evaluate.  In talking with patient she has been experiencing symptoms for several weeks.  Denies chest pain but reports the feeling of palpitations in the left  side of her chest.  Has noticed increasing lower extremity edema.  Reports that unfortunately she has not been fully compliant with her Eliquis prior to admission, sometimes only takes once a day, sometimes does not take at all.  Past Medical History:  Diagnosis Date   Acute deep vein thrombosis (DVT) of proximal vein of right lower extremity (HCC) 07/02/2022   Acute pulmonary embolism (HCC) 07/15/2022   Allergy    latex   Anemia    Anxiety    Arthritis    Bilateral pulmonary embolism (HCC) 06/30/2022   Cancer (HCC) 01/2017   right breast cancer   Depression    Dyslipidemia    Eustachian tube dysfunction    GERD (gastroesophageal reflux disease)    Grade I diastolic dysfunction 07/15/2022   Hx of cardiovascular stress test    Myoview 10/13:  apical thinning, EF 72%, no ischemia   Hx of colonic polyps    Dr Ewing Schlein   Hypertension    Hypothyroidism    Other abnormal glucose    Pulmonary embolism (HCC) 06/30/2022    Past Surgical History:  Procedure Laterality Date   ABDOMINAL HYSTERECTOMY     BSO for uterine fibroids   ACHILLES TENDON SURGERY Right 10/07/2012   Procedure: RIGHT REPAIR RUPTURE TENDON PRIMARY OPEN/PERCUTANEOUS ;  Surgeon: Loreta Ave, MD;  Location: [facility];  Service: Orthopedics;  Laterality: Right;   APPENDECTOMY     BREAST LUMPECTOMY WITH RADIOACTIVE SEED AND SENTINEL LYMPH NODE BIOPSY Right 03/16/2017  Procedure: RIGHT BREAST LUMPECTOMY WITH RADIOACTIVE SEED AND RIGHT AXILLARY SENTINEL  NODE BIOPSY ERAS PATHWAY;  Surgeon: Emelia Loron, MD;  Location: [facility];  Service: General;  Laterality: Right;  PECTORAL BLOCK   CATARACT EXTRACTION, BILATERAL     CHOLECYSTECTOMY     COLONOSCOPY W/ POLYPECTOMY     Dr Ewing Schlein   EAR CYST EXCISION Right 10/07/2012   Procedure: EXCISION BONE CYST BENIGN TUMOR Manuela Neptune ;  Surgeon: Loreta Ave, MD;  Location:  SURGERY CENTER;  Service: Orthopedics;  Laterality:  Right;   EXPLORATORY LAPAROTOMY     LEFT HEART CATH AND CORONARY ANGIOGRAPHY N/A 08/02/2019   Procedure: LEFT HEART CATH AND CORONARY ANGIOGRAPHY;  Surgeon: Swaziland, Peter M, MD;  Location: [facility] INVAS[facility] CV [facility];  Service: Cardiovascular;  Laterality: N/A;   REVISION TOTAL SHOULDER TO REVERSE TOTAL SHOULDER Left 01/12/2019   Procedure: REVISION TOTAL SHOULDER TO REVERSE TOTAL SHOULDER;  Surgeon: Bjorn Pippin, MD;  Location: [facility];  Service: Orthopedics;  Laterality: Left;   SINUS SURGERY WITH INSTATRAK     TOTAL SHOULDER REPLACEMENT     Right shoulder   TOTAL SHOULDER REPLACEMENT  05/2010   Left   TUBAL LIGATION       Inpatient Medications: Scheduled Meds:  apixaban  5 mg Oral BID   donepezil  10 mg Oral QHS   furosemide  40 mg Intravenous Q12H   memantine  10 mg Oral BID   pantoprazole  40 mg Oral Daily   Continuous Infusions:  diltiazem (CARDIZEM) infusion 12.5 mg/hr (02/12/23 0646)   PRN Meds: acetaminophen **OR** acetaminophen, melatonin, ondansetron **OR** ondansetron (ZOFRAN) IV  Allergies:    Allergies  Allergen Reactions   Latex Rash   Sulfa Antibiotics Anaphylaxis and Nausea Only   Aspirin Other (See Comments)    GI bleeding   Nsaids Other (See Comments)    GI upset    Oxycodone-Acetaminophen Nausea Only    Social History:   Social History   Socioeconomic History   Marital status: Widowed    Spouse name: Not on file   Number of children: 1   Years of education: Not on file   Highest education level: Not on file  Occupational History   Not on file  Tobacco Use   Smoking status: Former    Current packs/day: 0.00    Average packs/day: 0.5 packs/day for 6.0 years (3.0 ttl pk-yrs)    Types: Cigarettes    Start date: 06/30/1956    Quit date: 06/30/1962    Years since quitting: 60.6   Smokeless tobacco: Never   Tobacco comments:    Quit age 73  Vaping Use   Vaping status: Never Used  Substance and Sexual Activity   Alcohol use: Yes    Alcohol/week: 3.0  standard drinks of alcohol    Types: 3 Glasses of wine per week    Comment: wine  : < 3 glasses weekly   Drug use: No   Sexual activity: Yes    Birth control/protection: Post-menopausal, Surgical  Other Topics Concern   Not on file  Social History Narrative   Lives husband and daughter lives with her.    Social Determinants of Health   Financial Resource Strain: Low Risk  (03/12/2021)   Received from [facility] visits prior to 08/30/2022., [facility] visits prior to 08/30/2022.   Overall Financial Resource Strain (CARDIA)    Difficulty of Paying Living Expenses: Not very hard  Food Insecurity: No Food Insecurity (02/12/2023)   Hunger Vital Sign    Worried About Running Out of Food in the Last Year: Never true    Ran Out of Food in the Last Year: Never true  Transportation Needs: No Transportation Needs (02/12/2023)   PRAPARE - Administrator, Civil Service (Medical): No    Lack of Transportation (Non-Medical): No  Physical Activity: Inactive (03/12/2021)   Received from [facility] visits prior to 08/30/2022., [facility] visits prior to 08/30/2022.   Exercise Vital Sign    Days of Exercise per Week: 0 days    Minutes of Exercise per Session: 0 min  Stress: Stress Concern Present (03/12/2021)   [facility] from [facility] visits prior to 08/30/2022., [facility] visits prior to 08/30/2022.   [employer] of Occupational Health - Occupational Stress Questionnaire    Feeling of Stress : Rather much  Social Connections: Moderately Isolated (03/12/2021)   Received from [facility] visits prior to 08/30/2022., [facility] visits prior to 08/30/2022.   Social Advertising account executive [NHANES]    Frequency of Communication with Friends and Family: Twice a week    Frequency of Social Gatherings with Friends and  Family: Twice a week    Attends Religious Services: 1 to 4 times per year    Active Member of [employer] or Organizations: No    Attends Banker Meetings: Never    Marital Status: Widowed  Intimate Partner Violence: Not At Risk (02/12/2023)   Humiliation, Afraid, Rape, and Kick questionnaire    Fear of Current or Ex-Partner: No    Emotionally Abused: No    Physically Abused: No    Sexually Abused: No    Family History:    Family History  Problem Relation Age of Onset   Hiatal hernia Mother    Heart attack Mother 74   Heart disease Father 53       CAD @ autopsy   Cancer Father        renal cancer   Heart attack Brother 50       fatal   Diabetes Neg Hx    Stroke Neg Hx      ROS:  Please see the history of present illness.   All other ROS reviewed and negative.     Physical Exam/Data:   Vitals:   02/12/23 0500 02/12/23 0602 02/12/23 0817 02/12/23 1204  BP:  130/70 (!) 147/71 (!) 174/84  Pulse:  89 99 (!) 114  Resp:  16 18 18   Temp:   98 F (36.7 C) 98.5 F (36.9 C)  TempSrc:   Oral Oral  SpO2:  97% 95% 96%  Weight: 86 kg     Height:        Intake/Output Summary (Last 24 hours) at 02/12/2023 1357 Last data filed at 02/12/2023 0500 Gross per 24 hour  Intake 221.73 ml  Output 650 ml  Net -428.27 ml      02/12/2023    5:00 AM 02/11/2023   10:47 PM 02/11/2023    3:45 PM  Last 3 Weights  Weight (lbs) 189 lb 9.5 oz 189 lb 12.8 oz 185 lb  Weight (kg) 86 kg 86.093 kg 83.915 kg     Body mass index is 33.59 kg/m.  General:  Well nourished, well developed, in no acute distress HEENT: normal Neck: + JVD Vascular: No carotid  bruits; Distal pulses 2+ bilaterally Cardiac:  normal S1, S2; irregularly irregular; no murmur  Lungs:  clear to auscultation bilaterally, no wheezing, rhonchi or rales  Abd: soft, nontender, no hepatomegaly  Ext: no edema Musculoskeletal:  No deformities, BUE and BLE strength normal and equal Skin: warm and dry  Neuro:  CNs 2-12  intact, no focal abnormalities noted Psych:  Normal affect   EKG:  The EKG was personally reviewed and demonstrates: Atrial flutter variable AV block, 126 bpm Telemetry:  Telemetry was personally reviewed and demonstrates:  Atrial fibrillation, rates 80-120s  Relevant CV Studies:  Echo: 02/12/2023  IMPRESSIONS     1. Left ventricular ejection fraction, by estimation, is 70 to 75%. The  left ventricle has hyperdynamic function. The left ventricle has no  regional wall motion abnormalities. Left ventricular diastolic parameters  are indeterminate.   2. Right ventricular systolic function is normal. The right ventricular  size is normal.   3. Left atrial size was mildly dilated.   4. The mitral valve is normal in structure. No evidence of mitral valve  regurgitation. No evidence of mitral stenosis.   5. The aortic valve is tricuspid. There is mild calcification of the  aortic valve. Aortic valve regurgitation is not visualized. No aortic  stenosis is present.   6. The inferior vena cava is normal in size with greater than 50%  respiratory variability, suggesting right atrial pressure of 3 mmHg.   Conclusion(s)/Recommendation(s): RV not well seen but appears grossly  normal.   FINDINGS   Left Ventricle: Left ventricular ejection fraction, by estimation, is 70  to 75%. The left ventricle has hyperdynamic function. The left ventricle  has no regional wall motion abnormalities. Definity contrast agent was  given IV to delineate the left  ventricular endocardial borders. The left ventricular internal cavity size  was normal in size. There is no left ventricular hypertrophy. Left  ventricular diastolic parameters are indeterminate.   Right Ventricle: The right ventricular size is normal. No increase in  right ventricular wall thickness. Right ventricular systolic function is  normal.   Left Atrium: Left atrial size was mildly dilated.   Right Atrium: Right atrial size was normal  in size.   Pericardium: There is no evidence of pericardial effusion.   Mitral Valve: The mitral valve is normal in structure. No evidence of  mitral valve regurgitation. No evidence of mitral valve stenosis.   Tricuspid Valve: The tricuspid valve is normal in structure. Tricuspid  valve regurgitation is trivial. No evidence of tricuspid stenosis.   Aortic Valve: The aortic valve is tricuspid. There is mild calcification  of the aortic valve. Aortic valve regurgitation is not visualized. No  aortic stenosis is present. Aortic valve peak gradient measures 7.5 mmHg.   Pulmonic Valve: The pulmonic valve was normal in structure. Pulmonic valve  regurgitation is trivial. No evidence of pulmonic stenosis.   Aorta: The aortic root is normal in size and structure.   Venous: The inferior vena cava was not well visualized. The inferior vena  cava is normal in size with greater than 50% respiratory variability,  suggesting right atrial pressure of 3 mmHg.   IAS/Shunts: No atrial level shunt detected by color flow Doppler.       Laboratory Data:  High Sensitivity Troponin:   Recent Labs  Lab 02/11/23 1820 02/11/23 2055  TROPONINIHS 22* 24*     Chemistry Recent Labs  Lab 02/11/23 1820 02/12/23 0821  NA 138 135  K 3.9 4.0  CL  98 103  CO2 22 20*  GLUCOSE 116* 150*  BUN 12 10  CREATININE 1.07* 1.14*  CALCIUM 10.1 9.4  MG  --  2.1  GFRNONAA 52* 48*  ANIONGAP 18* 12    Recent Labs  Lab 02/12/23 0821  PROT 6.9  ALBUMIN 3.6  AST 28  ALT 26  ALKPHOS 51  BILITOT 0.8   Lipids No results for input(s): "CHOL", "TRIG", "HDL", "LABVLDL", "LDLCALC", "CHOLHDL" in the last 168 hours.  Hematology Recent Labs  Lab 02/11/23 1820  WBC 7.4  RBC 4.64  HGB 13.2  HCT 41.0  MCV 88.4  MCH 28.4  MCHC 32.2  RDW 12.6  PLT 306   Thyroid  Recent Labs  Lab 02/11/23 2055  TSH 7.963*    BNP Recent Labs  Lab 02/11/23 1820  BNP 202.3*    DDimer No results for input(s):  "DDIMER" in the last 168 hours.   Radiology/Studies:  VAS [place] LOWER EXTREMITY VENOUS (DVT)  Result Date: 02/12/2023  Lower Venous DVT Study Patient Name:  KATERIN REPINSKI  Date of Exam:   02/12/2023 Medical Rec #: [id number]        Accession #:    [id number] Date of Birth: 06/18/1941        Patient Gender: F Patient Age:   89 years Exam Location:  [facility] Procedure:      VAS [place] LOWER EXTREMITY VENOUS (DVT) Referring Phys: ERIC CHEN --------------------------------------------------------------------------------  Indications: Hx of DVT in right popliteal vein (06/30/22), Hx of PE, LLE pain and swelling.  Anticoagulation: Eliquis. Limitations: Body habitus, poor ultrasound/tissue interface and Patient unable to tolerate compressions. Comparison Study: Previous study 06/30/22 positive in right popliteal vein. Performing Technologist: McKayla Maag RVT, VT  Examination Guidelines: A complete evaluation includes B-mode imaging, spectral Doppler, color Doppler, and power Doppler as needed of all accessible portions of each vessel. Bilateral testing is considered an integral part of a complete examination. Limited examinations for reoccurring indications may be performed as noted. The reflux portion of the exam is performed with the patient in reverse Trendelenburg.  +---------+---------------+---------+-----------+----------+--------------+ RIGHT    CompressibilityPhasicitySpontaneityPropertiesThrombus Aging +---------+---------------+---------+-----------+----------+--------------+ CFV      Full           Yes      Yes                                 +---------+---------------+---------+-----------+----------+--------------+ SFJ      Full                                                        +---------+---------------+---------+-----------+----------+--------------+ FV Prox  Full                                                         +---------+---------------+---------+-----------+----------+--------------+ FV Mid   Full                                                        +---------+---------------+---------+-----------+----------+--------------+  FV DistalFull                                                        +---------+---------------+---------+-----------+----------+--------------+ PFV      Full                                                        +---------+---------------+---------+-----------+----------+--------------+ POP      Full           Yes      Yes                                 +---------+---------------+---------+-----------+----------+--------------+ PTV      Full                                                        +---------+---------------+---------+-----------+----------+--------------+ PERO                                                  Not visualized +---------+---------------+---------+-----------+----------+--------------+   +--------+---------------+---------+-----------+----------+--------------------+ LEFT    CompressibilityPhasicitySpontaneityPropertiesThrombus Aging       +--------+---------------+---------+-----------+----------+--------------------+ CFV                    Yes      Yes                  Patent by color                                                           doppler              +--------+---------------+---------+-----------+----------+--------------------+ SFJ                    Yes      Yes                  Patent by color                                                           doppler              +--------+---------------+---------+-----------+----------+--------------------+ FV Prox Full                                                              +--------+---------------+---------+-----------+----------+--------------------+  FV Mid                 Yes      Yes                  Patent by  color                                                           doppler              +--------+---------------+---------+-----------+----------+--------------------+ FV                                                   Not visualized       Distal                                                                    +--------+---------------+---------+-----------+----------+--------------------+ PFV     Full                                                              +--------+---------------+---------+-----------+----------+--------------------+ POP     Full           Yes      Yes                                       +--------+---------------+---------+-----------+----------+--------------------+ PTV     Full                                                              +--------+---------------+---------+-----------+----------+--------------------+ PERO                                                 Not visualized       +--------+---------------+---------+-----------+----------+--------------------+    Summary: RIGHT: - There is no evidence of deep vein thrombosis in the lower extremity. However, portions of this examination were limited- see technologist comments above.  - No cystic structure found in the popliteal fossa.  LEFT: - There is no evidence of deep vein thrombosis in the lower extremity. However, portions of this examination were limited- see technologist comments above.  - No cystic structure found in the popliteal fossa.  *See table(s) above for measurements and observations.    Preliminary    DG Chest Portable 1 View  Result  Date: 02/11/2023 CLINICAL DATA:  Palpitations EXAM: PORTABLE CHEST 1 VIEW COMPARISON:  07/15/2022 FINDINGS: Bilateral shoulder replacements. No acute airspace disease or effusion. Stable cardiomediastinal silhouette with aortic atherosclerosis. IMPRESSION: No active disease. Electronically Signed   By: Jasmine Pang M.D.   On:  02/11/2023 18:58     Assessment and Plan:   Brenda Stephens is a 82 y.o. 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  New onset atrial flutter RVR Atrial fibrillation --Presented with new onset atrial fibrillation in the office 8/14, EKG in the ED actually appears to be atrial flutter with variable AV conduction.  She was started on IV diltiazem with improvement in rates, currently at 12.5 mg/hr.  -- Unfortunately she has not been fully compliant with her Eliquis prior to admission -- Suspect she will ultimately need to undergo cardioversion given she is symptomatic.  She will require a TEE as she has missed doses of her Eliquis prior to admission. Will tentatively hold slot for Monday. -- Continue diuresis, IV diltiazem -- Continue Eliquis 5 mg twice daily  HFpEF -- Echocardiogram this admission shows LVEF of 70 to 75%, no regional wall motion abnormality, normal RV, mildly dilated left atria -- Chest x-ray negative for edema on admission, BNP 202 -- Currently receiving IV Lasix 40 mg twice daily, 650 cc urine output, still very dyspneic with minimal activity  Hypertension -- Blood pressures variable -- Continue IV diltiazem -- Home Diovan held, will resume metoprolol XL 25 mg  History of PE/DVT -- Has been on Eliquis 5 mg twice daily prior to admission (missed doses)  Hypothyroidism -- TSH 7.963 -- per primary   Risk Assessment/Risk Scores:     New York Heart Association (NYHA) Functional Class NYHA Class II  CHA2DS2-VASc Score = 4   This indicates a 4.8% annual risk of stroke. The patient's score is based upon: CHF History: 0 HTN History: 1 Diabetes History: 0 Stroke History: 0 Vascular Disease History: 0 Age Score: 2 Gender Score: 1   For questions or updates,  please contact [facility] Please consult [email or web address] for contact info under    Signed, Laverda Page, NP  02/12/2023 1:57 PM

## 2023-02-12 NOTE — Progress Notes (Signed)
Echocardiogram 2D Echocardiogram has been performed.   N ,RDCS 02/12/2023, 11:26 AM

## 2023-02-12 NOTE — Plan of Care (Signed)
°  Problem: Education: °Goal: Knowledge of disease or condition will improve °Outcome: Progressing °Goal: Understanding of medication regimen will improve °Outcome: Progressing °  °

## 2023-02-12 NOTE — Progress Notes (Signed)
PROGRESS NOTE    Brenda Stephens  EXB:[id number] DOB: [birth year] DOA: 02/11/2023 PCP: Verlee Rossetti, PA-C  Outpatient Specialists:     Brief Narrative:  Patient is an 82 year old female with past medical history significant for pulmonary embolism, DVT on Eliquis but noncompliant, hypertension, hypothyroidism, hyperlipidemia and history of dementia.  Patient was noted to be in A-fib with RVR at the cardiology office and was asked to come to the hospital for further evaluation and management.  There was also associated chest heaviness and leg swelling.  Echocardiogram revealed normal ejection fraction, suspected acute on chronic diastolic congestive heart failure.  Cardiology team has been consulted.  Cardiology team is directing care.  Patient is already on anticoagulation.  Heart rate is controlled.  02/12/2023: Patient seen alongside patient's daughter.  Need to comply with DOAC was discussed with the patient and patient's daughter.  No chest pain.  Volume overload seems to be improving.  Cardiology input is appreciated.   Assessment & Plan:   Principal Problem:   Rapid atrial fibrillation (HCC) Active Problems:   Hypothyroidism   Hyperlipidemia   Essential hypertension   Leg swelling   Dementia without behavioral disturbance (HCC)   History of pulmonary embolism   History of DVT of lower extremity   Atrial fibrillation with RVR (HCC)   Rapid atrial fibrillation [facility]) -Cardiology input is appreciated.   -Cardizem drip.   -Eliquis. -Toprol-XL 25 Mg p.o. once daily. -Heart rate is controlled. -Echo report is noted. -Cardiology is directing care.     Leg swelling/acute on chronic diastolic CHF: -See echo report. -IV Lasix. -Cautious use of IV Cardizem and Toprol-XL. -Cardiology is directing care. -Strict I's and O's. -Monitor renal function and electrolytes.   History of DVT of lower extremity Continue with Eliquis 5 mg bid   History of pulmonary embolism Continue  with Eliquis 5 mg bid   Dementia without behavioral disturbance (HCC) Continue with aricept and namenda.   Essential hypertension Hold diovan while on IV cardizem. Hold lopressor while on IV cardizem.   Hyperlipidemia Stable.   Hypothyroidism TSH is 7.963 Repeat TSH and free T4 in 4 to 6 weeks (when patient is out of the hospital).   DVT prophylaxis: Eliquis. Code Status: Full code. Family Communication: Daughter. Disposition Plan: Home eventually.   Consultants:  Cardiology.  Procedures:  Echocardiogram revealed: 1. Left ventricular ejection fraction, by estimation, is 70 to 75%. The  left ventricle has hyperdynamic function. The left ventricle has no  regional wall motion abnormalities. Left ventricular diastolic parameters  are indeterminate.   2. Right ventricular systolic function is normal. The right ventricular  size is normal.   3. Left atrial size was mildly dilated.   4. The mitral valve is normal in structure. No evidence of mitral valve  regurgitation. No evidence of mitral stenosis.   5. The aortic valve is tricuspid. There is mild calcification of the  aortic valve. Aortic valve regurgitation is not visualized. No aortic  stenosis is present.   6. The inferior vena cava is normal in size with greater than 50%  respiratory variability, suggesting right atrial pressure of 3 mmHg.   Antimicrobials:  None.   Subjective: No chest pain. Dyspnea on minimal exertion reported.  Objective: Vitals:   02/12/23 0817 02/12/23 1204 02/12/23 1526 02/12/23 1921  BP: (!) 147/71 (!) 174/84 (!) 158/74 (!) 138/55  Pulse: 99 (!) 114 85 83  Resp: 18 18 18 16   Temp: 98 F (36.7 C) 98.5 F (36.9 C)  98 F (36.7 C) 97.7 F (36.5 C)  TempSrc: Oral Oral Oral Oral  SpO2: 95% 96% 94% 94%  Weight:      Height:        Intake/Output Summary (Last 24 hours) at 02/12/2023 2018 Last data filed at 02/12/2023 1829 Gross per 24 hour  Intake 345.57 ml  Output 1250 ml  Net  -904.43 ml   Filed Weights   02/11/23 2247 02/12/23 0500  Weight: 86.1 kg 86 kg    Examination:  General exam: Appears calm and comfortable.  Patient is obese. Respiratory system: Clear to auscultation.  Cardiovascular system: S1 & S2, irregularly irregular. Gastrointestinal system: Abdomen is obese, soft and nontender.   Central nervous system: Awake and alert.  Moves all extremities.   Extremities: Mild edema of lower extremities.  Data Reviewed: I have personally reviewed following labs and imaging studies  CBC: Recent Labs  Lab 02/11/23 1820  WBC 7.4  HGB 13.2  HCT 41.0  MCV 88.4  PLT 306   Basic Metabolic Panel: Recent Labs  Lab 02/11/23 1820 02/12/23 0821  NA 138 135  K 3.9 4.0  CL 98 103  CO2 22 20*  GLUCOSE 116* 150*  BUN 12 10  CREATININE 1.07* 1.14*  CALCIUM 10.1 9.4  MG  --  2.1   GFR: Estimated Creatinine Clearance: 40.2 mL/min (A) (by C-G formula based on SCr of 1.14 mg/dL (H)). Liver Function Tests: Recent Labs  Lab 02/12/23 0821  AST 28  ALT 26  ALKPHOS 51  BILITOT 0.8  PROT 6.9  ALBUMIN 3.6   No results for input(s): "LIPASE", "AMYLASE" in the last 168 hours. No results for input(s): "AMMONIA" in the last 168 hours. Coagulation Profile: No results for input(s): "INR", "PROTIME" in the last 168 hours. Cardiac Enzymes: No results for input(s): "CKTOTAL", "CKMB", "CKMBINDEX", "TROPONINI" in the last 168 hours. BNP (last 3 results) Recent Labs    02/21/22 1021  PROBNP 35.0   HbA1C: No results for input(s): "HGBA1C" in the last 72 hours. CBG: No results for input(s): "GLUCAP" in the last 168 hours. Lipid Profile: No results for input(s): "CHOL", "HDL", "LDLCALC", "TRIG", "CHOLHDL", "LDLDIRECT" in the last 72 hours. Thyroid Function Tests: Recent Labs    02/11/23 2055  TSH 7.963*   Anemia Panel: No results for input(s): "VITAMINB12", "FOLATE", "FERRITIN", "TIBC", "IRON", "RETICCTPCT" in the last 72 hours. Urine analysis:     Component Value Date/Time   COLORURINE YELLOW 06/04/2010 0845   APPEARANCEUR CLOUDY (A) 06/04/2010 0845   LABSPEC 1.011 06/04/2010 0845   PHURINE 7.5 06/04/2010 0845   GLUCOSEU NEGATIVE 06/04/2010 0845   HGBUR NEGATIVE 06/04/2010 0845   BILIRUBINUR NEGATIVE 06/04/2010 0845   KETONESUR NEGATIVE 06/04/2010 0845   PROTEINUR NEGATIVE 06/04/2010 0845   UROBILINOGEN 0.2 06/04/2010 0845   NITRITE NEGATIVE 06/04/2010 0845   LEUKOCYTESUR LARGE (A) 06/04/2010 0845   Sepsis Labs: @LABRCNTIP (procalcitonin:4,lacticidven:4)  )No results found for this or any previous visit (from the past 240 hour(s)).       Radiology Studies: ECHOCARDIOGRAM COMPLETE  Result Date: 02/12/2023    ECHOCARDIOGRAM REPORT   Patient Name:   Brenda Stephens Date of Exam: 02/12/2023 Medical Rec #:  [id number]       Height:       63.0 in Accession #:    [id number]      Weight:       189.6 lb Date of Birth:  Oct 09, 1940       BSA:  1.890 m Patient Age:    82 years        BP:           135/74 mmHg Patient Gender: F               HR:           97 bpm. Exam Location:  Inpatient Procedure: 2D Echo, Color Doppler, Cardiac Doppler and Intracardiac            Opacification Agent Indications:    Atrial Flutter  History:        Patient has prior history of Echocardiogram examinations, most                 recent 06/30/2022. Arrythmias:Atrial Flutter,                 Signs/Symptoms:Dyspnea and Fatigue; Risk Factors:Former Smoker                 and Hypothyroidism. Acute pulmonary Embolism.  Sonographer:    Raeford Razor RDCS Referring Phys: [phone] ERIC CHEN  Sonographer Comments: Technically difficult study due to poor echo windows and no subcostal window. Image acquisition challenging due to patient body habitus. IMPRESSIONS  1. Left ventricular ejection fraction, by estimation, is 70 to 75%. The left ventricle has hyperdynamic function. The left ventricle has no regional wall motion abnormalities. Left ventricular diastolic parameters are  indeterminate.  2. Right ventricular systolic function is normal. The right ventricular size is normal.  3. Left atrial size was mildly dilated.  4. The mitral valve is normal in structure. No evidence of mitral valve regurgitation. No evidence of mitral stenosis.  5. The aortic valve is tricuspid. There is mild calcification of the aortic valve. Aortic valve regurgitation is not visualized. No aortic stenosis is present.  6. The inferior vena cava is normal in size with greater than 50% respiratory variability, suggesting right atrial pressure of 3 mmHg. Conclusion(s)/Recommendation(s): RV not well seen but appears grossly normal. FINDINGS  Left Ventricle: Left ventricular ejection fraction, by estimation, is 70 to 75%. The left ventricle has hyperdynamic function. The left ventricle has no regional wall motion abnormalities. Definity contrast agent was given IV to delineate the left ventricular endocardial borders. The left ventricular internal cavity size was normal in size. There is no left ventricular hypertrophy. Left ventricular diastolic parameters are indeterminate. Right Ventricle: The right ventricular size is normal. No increase in right ventricular wall thickness. Right ventricular systolic function is normal. Left Atrium: Left atrial size was mildly dilated. Right Atrium: Right atrial size was normal in size. Pericardium: There is no evidence of pericardial effusion. Mitral Valve: The mitral valve is normal in structure. No evidence of mitral valve regurgitation. No evidence of mitral valve stenosis. Tricuspid Valve: The tricuspid valve is normal in structure. Tricuspid valve regurgitation is trivial. No evidence of tricuspid stenosis. Aortic Valve: The aortic valve is tricuspid. There is mild calcification of the aortic valve. Aortic valve regurgitation is not visualized. No aortic stenosis is present. Aortic valve peak gradient measures 7.5 mmHg. Pulmonic Valve: The pulmonic valve was normal in  structure. Pulmonic valve regurgitation is trivial. No evidence of pulmonic stenosis. Aorta: The aortic root is normal in size and structure. Venous: The inferior vena cava was not well visualized. The inferior vena cava is normal in size with greater than 50% respiratory variability, suggesting right atrial pressure of 3 mmHg. IAS/Shunts: No atrial level shunt detected by color flow Doppler.  LEFT VENTRICLE PLAX 2D  LVIDd:         4.30 cm LVIDs:         2.70 cm LV PW:         1.10 cm LV IVS:        1.10 cm LVOT diam:     2.00 cm LV SV:         55 LV SV Index:   29 LVOT Area:     3.14 cm  LV Volumes (MOD) LV vol d, MOD A2C: 53.2 ml LV vol d, MOD A4C: 64.3 ml LV vol s, MOD A2C: 18.6 ml LV vol s, MOD A4C: 22.5 ml LV SV MOD A2C:     34.6 ml LV SV MOD A4C:     64.3 ml LV SV MOD BP:      41.2 ml LEFT ATRIUM           Index LA diam:      4.30 cm 2.27 cm/m LA Vol (A2C): 37.8 ml 20.00 ml/m  AORTIC VALVE AV Area (Vmax): 3.23 cm AV Vmax:        137.00 cm/s AV Peak Grad:   7.5 mmHg LVOT Vmax:      141.00 cm/s LVOT Vmean:     92.100 cm/s LVOT VTI:       0.174 m  AORTA Ao Root diam: 2.80 cm Ao Asc diam:  3.50 cm MITRAL VALVE MV Area (PHT): 4.11 cm    SHUNTS MV Decel Time: 185 msec    Systemic VTI:  0.17 m MV E velocity: 81.73 cm/s  Systemic Diam: 2.00 cm Arvilla Meres MD Electronically signed by Arvilla Meres MD Signature Date/Time: 02/12/2023/2:31:45 PM    Final    VAS [place] LOWER EXTREMITY VENOUS (DVT)  Result Date: 02/12/2023  Lower Venous DVT Study Patient Name:  Brenda Stephens  Date of Exam:   02/12/2023 Medical Rec #: [id number]        Accession #:    [id number] Date of Birth: 03/10/1941        Patient Gender: F Patient Age:   35 years Exam Location:  [facility] Procedure:      VAS [place] LOWER EXTREMITY VENOUS (DVT) Referring Phys: ERIC CHEN --------------------------------------------------------------------------------  Indications: Hx of DVT in right popliteal vein (06/30/22), Hx of PE, LLE pain and  swelling.  Anticoagulation: Eliquis. Limitations: Body habitus, poor ultrasound/tissue interface and Patient unable to tolerate compressions. Comparison Study: Previous study 06/30/22 positive in right popliteal vein. Performing Technologist: McKayla Maag RVT, [place]  Examination Guidelines: A complete evaluation includes B-mode imaging, spectral Doppler, color Doppler, and power Doppler as needed of all accessible portions of each vessel. Bilateral testing is considered an integral part of a complete examination. Limited examinations for reoccurring indications may be performed as noted. The reflux portion of the exam is performed with the patient in reverse Trendelenburg.  +---------+---------------+---------+-----------+----------+--------------+ RIGHT    CompressibilityPhasicitySpontaneityPropertiesThrombus Aging +---------+---------------+---------+-----------+----------+--------------+ CFV      Full           Yes      Yes                                 +---------+---------------+---------+-----------+----------+--------------+ SFJ      Full                                                        +---------+---------------+---------+-----------+----------+--------------+  FV Prox  Full                                                        +---------+---------------+---------+-----------+----------+--------------+ FV Mid   Full                                                        +---------+---------------+---------+-----------+----------+--------------+ FV DistalFull                                                        +---------+---------------+---------+-----------+----------+--------------+ PFV      Full                                                        +---------+---------------+---------+-----------+----------+--------------+ POP      Full           Yes      Yes                                  +---------+---------------+---------+-----------+----------+--------------+ PTV      Full                                                        +---------+---------------+---------+-----------+----------+--------------+ PERO                                                  Not visualized +---------+---------------+---------+-----------+----------+--------------+   +--------+---------------+---------+-----------+----------+--------------------+ LEFT    CompressibilityPhasicitySpontaneityPropertiesThrombus Aging       +--------+---------------+---------+-----------+----------+--------------------+ CFV                    Yes      Yes                  Patent by color                                                           doppler              +--------+---------------+---------+-----------+----------+--------------------+ SFJ                    Yes      Yes                  Patent by color  doppler              +--------+---------------+---------+-----------+----------+--------------------+ FV Prox Full                                                              +--------+---------------+---------+-----------+----------+--------------------+ FV Mid                 Yes      Yes                  Patent by color                                                           doppler              +--------+---------------+---------+-----------+----------+--------------------+ FV                                                   Not visualized       Distal                                                                    +--------+---------------+---------+-----------+----------+--------------------+ PFV     Full                                                              +--------+---------------+---------+-----------+----------+--------------------+ POP     Full            Yes      Yes                                       +--------+---------------+---------+-----------+----------+--------------------+ PTV     Full                                                              +--------+---------------+---------+-----------+----------+--------------------+ PERO                                                 Not visualized       +--------+---------------+---------+-----------+----------+--------------------+     Summary: RIGHT: - There is no evidence of deep vein thrombosis in the  lower extremity. However, portions of this examination were limited- see technologist comments above.  - No cystic structure found in the popliteal fossa.  LEFT: - There is no evidence of deep vein thrombosis in the lower extremity. However, portions of this examination were limited- see technologist comments above.  - No cystic structure found in the popliteal fossa.  *See table(s) above for measurements and observations. Electronically signed by Sherald Hess MD on 02/12/2023 at 2:07:23 PM.    Final    DG Chest Portable 1 View  Result Date: 02/11/2023 CLINICAL DATA:  Palpitations EXAM: PORTABLE CHEST 1 VIEW COMPARISON:  07/15/2022 FINDINGS: Bilateral shoulder replacements. No acute airspace disease or effusion. Stable cardiomediastinal silhouette with aortic atherosclerosis. IMPRESSION: No active disease. Electronically Signed   By: Jasmine Pang M.D.   On: 02/11/2023 18:58        Scheduled Meds:  apixaban  5 mg Oral BID   donepezil  10 mg Oral QHS   furosemide  40 mg Intravenous Q12H   memantine  10 mg Oral BID   metoprolol succinate  25 mg Oral Daily   pantoprazole  40 mg Oral Daily   Continuous Infusions:  diltiazem (CARDIZEM) infusion 12.5 mg/hr (02/12/23 1506)     LOS: 0 days    Time spent: 35 minutes.    Berton Mount, MD  [facility] Pager #: [phone] 7PM-7AM contact night coverage as above

## 2023-02-12 NOTE — TOC CM/SW Note (Signed)
Transition of Care [facility]) - Inpatient Brief Assessment   Patient Details  Name: Brenda Stephens MRN: [id number] Date of Birth: 1941-06-28  Transition of Care [facility]) CM/SW Contact:    Gala Lewandowsky, RN Phone Number: 02/12/2023, 4:30 PM   Clinical Narrative: Patient presented for rapid Atrial Fib and initiated on Cardizem gtt. Patient has now transitioned to oral Eliquis. Benefits check submitted for cost. Case Manager will continue to follow for transition of care needs as the patient progresses.     Transition of Care Asessment: Insurance and Status: Insurance coverage has been reviewed Patient has primary care physician: Yes  Prior/Current Home Services: No current home services Social Determinants of Health Reivew: SDOH reviewed no interventions necessary Readmission risk has been reviewed: Yes Transition of care needs: transition of care needs identified, TOC will continue to follow

## 2023-02-12 NOTE — Progress Notes (Signed)
Bilateral lower extremity venous study completed.   Preliminary results relayed to RN.  Please see CV Procedures for preliminary results.   , RVT  9:54 AM 02/12/23

## 2023-02-13 ENCOUNTER — Other Ambulatory Visit (HOSPITAL_COMMUNITY): Payer: Self-pay

## 2023-02-13 DIAGNOSIS — I5030 Unspecified diastolic (congestive) heart failure: Secondary | ICD-10-CM | POA: Diagnosis not present

## 2023-02-13 DIAGNOSIS — I4891 Unspecified atrial fibrillation: Secondary | ICD-10-CM | POA: Diagnosis not present

## 2023-02-13 LAB — CBC WITH DIFFERENTIAL/PLATELET
Abs Immature Granulocytes: 0.03 10*3/uL (ref 0.00–0.07)
Basophils Absolute: 0 10*3/uL (ref 0.0–0.1)
Basophils Relative: 0 %
Eosinophils Absolute: 0.1 10*3/uL (ref 0.0–0.5)
Eosinophils Relative: 1 %
HCT: 40.3 % (ref 36.0–46.0)
Hemoglobin: 13.5 g/dL (ref 12.0–15.0)
Immature Granulocytes: 0 %
Lymphocytes Relative: 11 %
Lymphs Abs: 1.2 10*3/uL (ref 0.7–4.0)
MCH: 29.5 pg (ref 26.0–34.0)
MCHC: 33.5 g/dL (ref 30.0–36.0)
MCV: 88.2 fL (ref 80.0–100.0)
Monocytes Absolute: 1 10*3/uL (ref 0.1–1.0)
Monocytes Relative: 10 %
Neutro Abs: 8.2 10*3/uL — ABNORMAL HIGH (ref 1.7–7.7)
Neutrophils Relative %: 78 %
Platelets: 316 10*3/uL (ref 150–400)
RBC: 4.57 MIL/uL (ref 3.87–5.11)
RDW: 13 % (ref 11.5–15.5)
WBC: 10.6 10*3/uL — ABNORMAL HIGH (ref 4.0–10.5)
nRBC: 0 % (ref 0.0–0.2)

## 2023-02-13 LAB — RENAL FUNCTION PANEL
Albumin: 3.5 g/dL (ref 3.5–5.0)
Anion gap: 16 — ABNORMAL HIGH (ref 5–15)
BUN: 14 mg/dL (ref 8–23)
CO2: 19 mmol/L — ABNORMAL LOW (ref 22–32)
Calcium: 9.2 mg/dL (ref 8.9–10.3)
Chloride: 97 mmol/L — ABNORMAL LOW (ref 98–111)
Creatinine, Ser: 1.24 mg/dL — ABNORMAL HIGH (ref 0.44–1.00)
GFR, Estimated: 44 mL/min — ABNORMAL LOW (ref >60)
Glucose, Bld: 133 mg/dL — ABNORMAL HIGH (ref 70–99)
Phosphorus: 3.6 mg/dL (ref 2.5–4.6)
Potassium: 3.4 mmol/L — ABNORMAL LOW (ref 3.5–5.1)
Sodium: 132 mmol/L — ABNORMAL LOW (ref 135–145)

## 2023-02-13 LAB — MAGNESIUM: Magnesium: 1.8 mg/dL (ref 1.7–2.4)

## 2023-02-13 MED ORDER — FENTANYL CITRATE PF 50 MCG/ML IJ SOSY
PREFILLED_SYRINGE | INTRAMUSCULAR | Status: AC
  Filled 2023-02-13: qty 1

## 2023-02-13 MED ORDER — METOPROLOL SUCCINATE ER 50 MG PO TB24
50.0000 mg | ORAL_TABLET | Freq: Every day | ORAL | Status: DC
  Administered 2023-02-14 – 2023-02-17 (×4): 50 mg via ORAL
  Filled 2023-02-13 (×4): qty 1

## 2023-02-13 MED ORDER — MAGNESIUM SULFATE 2 GM/50ML IV SOLN
2.0000 g | Freq: Once | INTRAVENOUS | Status: AC
  Administered 2023-02-13: 2 g via INTRAVENOUS
  Filled 2023-02-13: qty 50

## 2023-02-13 MED ORDER — POTASSIUM CHLORIDE CRYS ER 20 MEQ PO TBCR
40.0000 meq | EXTENDED_RELEASE_TABLET | Freq: Once | ORAL | Status: AC
  Administered 2023-02-13: 40 meq via ORAL
  Filled 2023-02-13: qty 2

## 2023-02-13 MED ORDER — HYDROXYZINE HCL 25 MG PO TABS
25.0000 mg | ORAL_TABLET | Freq: Four times a day (QID) | ORAL | Status: DC | PRN
  Administered 2023-02-13 – 2023-02-17 (×9): 25 mg via ORAL
  Filled 2023-02-13 (×9): qty 1

## 2023-02-13 NOTE — Progress Notes (Signed)
PROGRESS NOTE    Brenda Stephens  QMV:[id number] DOB: [birth year] DOA: 02/11/2023 PCP: Verlee Rossetti, PA-C  Outpatient Specialists:     Brief Narrative:  Patient is an 82 year old female with past medical history significant for pulmonary embolism, DVT on Eliquis but noncompliant, hypertension, hypothyroidism, hyperlipidemia and history of dementia.  Patient was noted to be in A-fib with RVR at the cardiology office and was asked to come to the hospital for further evaluation and management.  There was also associated chest heaviness and leg swelling.  Echocardiogram revealed normal ejection fraction, suspected acute on chronic diastolic congestive heart failure.  Cardiology team has been consulted.  Cardiology team is directing care.  Patient is already on anticoagulation.  Heart rate is controlled.  02/12/2023: Patient seen alongside patient's daughter.  Need to comply with DOAC was discussed with the patient and patient's daughter.  No chest pain.  Volume overload seems to be improving.  Cardiology input is appreciated. 02/13/2023: Patient seen alongside patient's daughter.  Patient reports musculoskeletal chest pain.  Patient is still on Cardizem drip.  Heart rate is controlled.  Cardiology is directing care.   Assessment & Plan:   Principal Problem:   Rapid atrial fibrillation (HCC) Active Problems:   Hypothyroidism   Hyperlipidemia   Essential hypertension   Leg swelling   Dementia without behavioral disturbance (HCC)   History of pulmonary embolism   History of DVT of lower extremity   Atrial fibrillation with RVR (HCC)   Rapid atrial fibrillation [facility]) -Cardiology input is appreciated.   -Cardizem drip.   -Eliquis. -Toprol-XL 25 Mg p.o. once daily. -Heart rate is controlled. -Echo report is noted. -Cardiology is directing care.     Leg swelling/acute on chronic diastolic CHF: -See echo report. -IV Lasix discontinued. -Cardiology is managing. -Cautious use of IV  Cardizem and Toprol-XL. -Strict I's and O's. -Monitor renal function and electrolytes.   History of DVT of lower extremity Continue with Eliquis 5 mg bid   History of pulmonary embolism Continue with Eliquis 5 mg bid   Dementia without behavioral disturbance (HCC) Continue with aricept and namenda.   Essential hypertension Hold diovan while on IV cardizem. Hold lopressor while on IV cardizem.   Hyperlipidemia Stable.   Hypothyroidism TSH is 7.963 Repeat TSH and free T4 in 4 to 6 weeks (when patient is out of the hospital).   DVT prophylaxis: Eliquis. Code Status: Full code. Family Communication: Daughter. Disposition Plan: Home eventually.   Consultants:  Cardiology.  Procedures:  Echocardiogram revealed: 1. Left ventricular ejection fraction, by estimation, is 70 to 75%. The  left ventricle has hyperdynamic function. The left ventricle has no  regional wall motion abnormalities. Left ventricular diastolic parameters  are indeterminate.   2. Right ventricular systolic function is normal. The right ventricular  size is normal.   3. Left atrial size was mildly dilated.   4. The mitral valve is normal in structure. No evidence of mitral valve  regurgitation. No evidence of mitral stenosis.   5. The aortic valve is tricuspid. There is mild calcification of the  aortic valve. Aortic valve regurgitation is not visualized. No aortic  stenosis is present.   6. The inferior vena cava is normal in size with greater than 50%  respiratory variability, suggesting right atrial pressure of 3 mmHg.   Antimicrobials:  None.   Subjective: No chest pain. Dyspnea on minimal exertion reported.  Objective: Vitals:   02/13/23 0312 02/13/23 0500 02/13/23 0804 02/13/23 1137  BP: (!) 145/72  Marland Kitchen)  140/79 (!) 154/78  Pulse: 80  93 68  Resp: 18  18 18   Temp: 98 F (36.7 C)  98.1 F (36.7 C) 97.9 F (36.6 C)  TempSrc: Oral  Oral Oral  SpO2: 94%  95% 96%  Weight:  86.1 kg     Height:        Intake/Output Summary (Last 24 hours) at 02/13/2023 1435 Last data filed at 02/13/2023 1017 Gross per 24 hour  Intake 123.84 ml  Output 850 ml  Net -726.16 ml   Filed Weights   02/11/23 2247 02/12/23 0500 02/13/23 0500  Weight: 86.1 kg 86 kg 86.1 kg    Examination:  General exam: Appears calm and comfortable.  Patient is obese. Respiratory system: Clear to auscultation.  Cardiovascular system: S1 & S2, irregularly irregular. Gastrointestinal system: Abdomen is obese, soft and nontender.   Central nervous system: Awake and alert.  Moves all extremities.   Extremities: Mild edema of lower extremities.  Data Reviewed: I have personally reviewed following labs and imaging studies  CBC: Recent Labs  Lab 02/11/23 1820 02/13/23 0208  WBC 7.4 10.6*  NEUTROABS  --  8.2*  HGB 13.2 13.5  HCT 41.0 40.3  MCV 88.4 88.2  PLT 306 316   Basic Metabolic Panel: Recent Labs  Lab 02/11/23 1820 02/12/23 0821 02/13/23 0208  NA 138 135 132*  K 3.9 4.0 3.4*  CL 98 103 97*  CO2 22 20* 19*  GLUCOSE 116* 150* 133*  BUN 12 10 14   CREATININE 1.07* 1.14* 1.24*  CALCIUM 10.1 9.4 9.2  MG  --  2.1 1.8  PHOS  --   --  3.6   GFR: Estimated Creatinine Clearance: 37 mL/min (A) (by C-G formula based on SCr of 1.24 mg/dL (H)). Liver Function Tests: Recent Labs  Lab 02/12/23 0821 02/13/23 0208  AST 28  --   ALT 26  --   ALKPHOS 51  --   BILITOT 0.8  --   PROT 6.9  --   ALBUMIN 3.6 3.5   No results for input(s): "LIPASE", "AMYLASE" in the last 168 hours. No results for input(s): "AMMONIA" in the last 168 hours. Coagulation Profile: No results for input(s): "INR", "PROTIME" in the last 168 hours. Cardiac Enzymes: No results for input(s): "CKTOTAL", "CKMB", "CKMBINDEX", "TROPONINI" in the last 168 hours. BNP (last 3 results) Recent Labs    02/21/22 1021  PROBNP 35.0   HbA1C: No results for input(s): "HGBA1C" in the last 72 hours. CBG: No results for input(s):  "GLUCAP" in the last 168 hours. Lipid Profile: No results for input(s): "CHOL", "HDL", "LDLCALC", "TRIG", "CHOLHDL", "LDLDIRECT" in the last 72 hours. Thyroid Function Tests: Recent Labs    02/11/23 2055  TSH 7.963*   Anemia Panel: No results for input(s): "VITAMINB12", "FOLATE", "FERRITIN", "TIBC", "IRON", "RETICCTPCT" in the last 72 hours. Urine analysis:    Component Value Date/Time   COLORURINE YELLOW 06/04/2010 0845   APPEARANCEUR CLOUDY (A) 06/04/2010 0845   LABSPEC 1.011 06/04/2010 0845   PHURINE 7.5 06/04/2010 0845   GLUCOSEU NEGATIVE 06/04/2010 0845   HGBUR NEGATIVE 06/04/2010 0845   BILIRUBINUR NEGATIVE 06/04/2010 0845   KETONESUR NEGATIVE 06/04/2010 0845   PROTEINUR NEGATIVE 06/04/2010 0845   UROBILINOGEN 0.2 06/04/2010 0845   NITRITE NEGATIVE 06/04/2010 0845   LEUKOCYTESUR LARGE (A) 06/04/2010 0845   Sepsis Labs: @LABRCNTIP (procalcitonin:4,lacticidven:4)  )No results found for this or any previous visit (from the past 240 hour(s)).       Radiology Studies: ECHOCARDIOGRAM COMPLETE  Result Date: 02/12/2023    ECHOCARDIOGRAM REPORT   Patient Name:   RHYLAN BUOL Date of Exam: 02/12/2023 Medical Rec #:  [id number]       Height:       63.0 in Accession #:    [id number]      Weight:       189.6 lb Date of Birth:  Jun 08, 1941       BSA:          1.890 m Patient Age:    82 years        BP:           135/74 mmHg Patient Gender: F               HR:           97 bpm. Exam Location:  Inpatient Procedure: 2D Echo, Color Doppler, Cardiac Doppler and Intracardiac            Opacification Agent Indications:    Atrial Flutter  History:        Patient has prior history of Echocardiogram examinations, most                 recent 06/30/2022. Arrythmias:Atrial Flutter,                 Signs/Symptoms:Dyspnea and Fatigue; Risk Factors:Former Smoker                 and Hypothyroidism. Acute pulmonary Embolism.  Sonographer:    Raeford Razor RDCS Referring Phys: [phone] ERIC CHEN  Sonographer  Comments: Technically difficult study due to poor echo windows and no subcostal window. Image acquisition challenging due to patient body habitus. IMPRESSIONS  1. Left ventricular ejection fraction, by estimation, is 70 to 75%. The left ventricle has hyperdynamic function. The left ventricle has no regional wall motion abnormalities. Left ventricular diastolic parameters are indeterminate.  2. Right ventricular systolic function is normal. The right ventricular size is normal.  3. Left atrial size was mildly dilated.  4. The mitral valve is normal in structure. No evidence of mitral valve regurgitation. No evidence of mitral stenosis.  5. The aortic valve is tricuspid. There is mild calcification of the aortic valve. Aortic valve regurgitation is not visualized. No aortic stenosis is present.  6. The inferior vena cava is normal in size with greater than 50% respiratory variability, suggesting right atrial pressure of 3 mmHg. Conclusion(s)/Recommendation(s): RV not well seen but appears grossly normal. FINDINGS  Left Ventricle: Left ventricular ejection fraction, by estimation, is 70 to 75%. The left ventricle has hyperdynamic function. The left ventricle has no regional wall motion abnormalities. Definity contrast agent was given IV to delineate the left ventricular endocardial borders. The left ventricular internal cavity size was normal in size. There is no left ventricular hypertrophy. Left ventricular diastolic parameters are indeterminate. Right Ventricle: The right ventricular size is normal. No increase in right ventricular wall thickness. Right ventricular systolic function is normal. Left Atrium: Left atrial size was mildly dilated. Right Atrium: Right atrial size was normal in size. Pericardium: There is no evidence of pericardial effusion. Mitral Valve: The mitral valve is normal in structure. No evidence of mitral valve regurgitation. No evidence of mitral valve stenosis. Tricuspid Valve: The tricuspid  valve is normal in structure. Tricuspid valve regurgitation is trivial. No evidence of tricuspid stenosis. Aortic Valve: The aortic valve is tricuspid. There is mild calcification of the aortic valve. Aortic valve regurgitation is not visualized. No aortic stenosis is  present. Aortic valve peak gradient measures 7.5 mmHg. Pulmonic Valve: The pulmonic valve was normal in structure. Pulmonic valve regurgitation is trivial. No evidence of pulmonic stenosis. Aorta: The aortic root is normal in size and structure. Venous: The inferior vena cava was not well visualized. The inferior vena cava is normal in size with greater than 50% respiratory variability, suggesting right atrial pressure of 3 mmHg. IAS/Shunts: No atrial level shunt detected by color flow Doppler.  LEFT VENTRICLE PLAX 2D LVIDd:         4.30 cm LVIDs:         2.70 cm LV PW:         1.10 cm LV IVS:        1.10 cm LVOT diam:     2.00 cm LV SV:         55 LV SV Index:   29 LVOT Area:     3.14 cm  LV Volumes (MOD) LV vol d, MOD A2C: 53.2 ml LV vol d, MOD A4C: 64.3 ml LV vol s, MOD A2C: 18.6 ml LV vol s, MOD A4C: 22.5 ml LV SV MOD A2C:     34.6 ml LV SV MOD A4C:     64.3 ml LV SV MOD BP:      41.2 ml LEFT ATRIUM           Index LA diam:      4.30 cm 2.27 cm/m LA Vol (A2C): 37.8 ml 20.00 ml/m  AORTIC VALVE AV Area (Vmax): 3.23 cm AV Vmax:        137.00 cm/s AV Peak Grad:   7.5 mmHg LVOT Vmax:      141.00 cm/s LVOT Vmean:     92.100 cm/s LVOT VTI:       0.174 m  AORTA Ao Root diam: 2.80 cm Ao Asc diam:  3.50 cm MITRAL VALVE MV Area (PHT): 4.11 cm    SHUNTS MV Decel Time: 185 msec    Systemic VTI:  0.17 m MV E velocity: 81.73 cm/s  Systemic Diam: 2.00 cm Arvilla Meres MD Electronically signed by Arvilla Meres MD Signature Date/Time: 02/12/2023/2:31:45 PM    Final    VAS [place] LOWER EXTREMITY VENOUS (DVT)  Result Date: 02/12/2023  Lower Venous DVT Study Patient Name:  MIKALYN HOSKING  Date of Exam:   02/12/2023 Medical Rec #: [id number]        Accession #:     [id number] Date of Birth: 1941-02-06        Patient Gender: F Patient Age:   84 years Exam [facility] Procedure:      VAS [place] LOWER EXTREMITY VENOUS (DVT) Referring Phys: ERIC CHEN --------------------------------------------------------------------------------  Indications: Hx of DVT in right popliteal vein (06/30/22), Hx of PE, LLE pain and swelling.  Anticoagulation: Eliquis. Limitations: Body habitus, poor ultrasound/tissue interface and Patient unable to tolerate compressions. Comparison Study: Previous study 06/30/22 positive in right popliteal vein. Performing Technologist: McKayla Maag RVT, [place]  Examination Guidelines: A complete evaluation includes B-mode imaging, spectral Doppler, color Doppler, and power Doppler as needed of all accessible portions of each vessel. Bilateral testing is considered an integral part of a complete examination. Limited examinations for reoccurring indications may be performed as noted. The reflux portion of the exam is performed with the patient in reverse Trendelenburg.  +---------+---------------+---------+-----------+----------+--------------+ RIGHT    CompressibilityPhasicitySpontaneityPropertiesThrombus Aging +---------+---------------+---------+-----------+----------+--------------+ CFV      Full           Yes  Yes                                 +---------+---------------+---------+-----------+----------+--------------+ SFJ      Full                                                        +---------+---------------+---------+-----------+----------+--------------+ FV Prox  Full                                                        +---------+---------------+---------+-----------+----------+--------------+ FV Mid   Full                                                        +---------+---------------+---------+-----------+----------+--------------+ FV DistalFull                                                         +---------+---------------+---------+-----------+----------+--------------+ PFV      Full                                                        +---------+---------------+---------+-----------+----------+--------------+ POP      Full           Yes      Yes                                 +---------+---------------+---------+-----------+----------+--------------+ PTV      Full                                                        +---------+---------------+---------+-----------+----------+--------------+ PERO                                                  Not visualized +---------+---------------+---------+-----------+----------+--------------+   +--------+---------------+---------+-----------+----------+--------------------+ LEFT    CompressibilityPhasicitySpontaneityPropertiesThrombus Aging       +--------+---------------+---------+-----------+----------+--------------------+ CFV                    Yes      Yes                  Patent by color  doppler              +--------+---------------+---------+-----------+----------+--------------------+ SFJ                    Yes      Yes                  Patent by color                                                           doppler              +--------+---------------+---------+-----------+----------+--------------------+ FV Prox Full                                                              +--------+---------------+---------+-----------+----------+--------------------+ FV Mid                 Yes      Yes                  Patent by color                                                           doppler              +--------+---------------+---------+-----------+----------+--------------------+ FV                                                   Not visualized       Distal                                                                     +--------+---------------+---------+-----------+----------+--------------------+ PFV     Full                                                              +--------+---------------+---------+-----------+----------+--------------------+ POP     Full           Yes      Yes                                       +--------+---------------+---------+-----------+----------+--------------------+ PTV     Full                                                              +--------+---------------+---------+-----------+----------+--------------------+  PERO                                                 Not visualized       +--------+---------------+---------+-----------+----------+--------------------+     Summary: RIGHT: - There is no evidence of deep vein thrombosis in the lower extremity. However, portions of this examination were limited- see technologist comments above.  - No cystic structure found in the popliteal fossa.  LEFT: - There is no evidence of deep vein thrombosis in the lower extremity. However, portions of this examination were limited- see technologist comments above.  - No cystic structure found in the popliteal fossa.  *See table(s) above for measurements and observations. Electronically signed by Sherald Hess MD on 02/12/2023 at 2:07:23 PM.    Final    DG Chest Portable 1 View  Result Date: 02/11/2023 CLINICAL DATA:  Palpitations EXAM: PORTABLE CHEST 1 VIEW COMPARISON:  07/15/2022 FINDINGS: Bilateral shoulder replacements. No acute airspace disease or effusion. Stable cardiomediastinal silhouette with aortic atherosclerosis. IMPRESSION: No active disease. Electronically Signed   By: Jasmine Pang M.D.   On: 02/11/2023 18:58        Scheduled Meds:  apixaban  5 mg Oral BID   donepezil  10 mg Oral QHS   memantine  10 mg Oral BID   [START ON 02/14/2023] metoprolol succinate  50 mg Oral Daily   pantoprazole  40 mg Oral Daily    potassium chloride  40 mEq Oral Once   Continuous Infusions:  diltiazem (CARDIZEM) infusion 12.5 mg/hr (02/13/23 0302)   magnesium sulfate bolus IVPB       LOS: 1 day    Time spent: 35 minutes.    Berton Mount, MD  [facility] Pager #: [phone] 7PM-7AM contact night coverage as above

## 2023-02-13 NOTE — Progress Notes (Signed)
   Patient Name: Brenda Stephens Date of Encounter: 02/13/2023 [facility] Cardiologist: Rollene Rotunda, MD  Interval Summary  .    Patient denies chest pain, palpitations, orthopnea. Breathing has improved, but still feels a bit "heavy"   Vital Signs .    Vitals:   02/13/23 0311 02/13/23 0312 02/13/23 0500 02/13/23 0804  BP:  (!) 145/72  (!) 140/79  Pulse: 72 80  93  Resp:  18  18  Temp:  98 F (36.7 C)  98.1 F (36.7 C)  TempSrc:  Oral  Oral  SpO2: 95% 94%  95%  Weight:   86.1 kg   Height:        Intake/Output Summary (Last 24 hours) at 02/13/2023 0944 Last data filed at 02/12/2023 2145 Gross per 24 hour  Intake 123.84 ml  Output 650 ml  Net -526.16 ml      02/13/2023    5:00 AM 02/12/2023    5:00 AM 02/11/2023   10:47 PM  Last 3 Weights  Weight (lbs) 189 lb 13.1 oz 189 lb 9.5 oz 189 lb 12.8 oz  Weight (kg) 86.1 kg 86 kg 86.093 kg      Telemetry/ECG    Atrial fibrillation, HR in the 80s-90s - Personally Reviewed  Physical Exam .   GEN: No acute distress.  Laying in the bed with head elevated  Neck: No JVD Cardiac: Irregular rate and rhythm. no murmurs, rubs, or gallops.  Respiratory: Clear to auscultation bilaterally. Normal work of breathing on room air  GI: Soft, nontender, non-distended  ZO:XWRUE edema in BLE  Assessment & Plan .     New onset Atrial Flutter with RVR Atrial Fibrillation  - Patient had presented with new onset atrial fibrillation in the office 8/14. In the ED, appeared to be in atrial flutter with variable AV conduction  - Patient was started on IV diltiazem in the ED - Remains on diltiazem gtt- currently 12.5 mg /hr. Remains in afib but HR is well controlled in the 80s-90s - Patient had not been fully compliant with eliquis prior to admission  - Given lack of symptoms when rates are well controlled, may be able to transition to PO diltiazem and discharged with DCCV in 3 weeks. However, she was fairly symptomatic on admission so  may benefit from TEE/DCCV this admission. I will discuss with Dr. Wyline Mood  - Continue eliquis 5 mg BID   HFpEF  - Echocardiogram this admission showed EF 70-75%, no regional wall motion abnormalities, normal RV function  - BNP was mildly elevated to 202.3 on arrival. CXR showed no active disease  - She received 3 doses of IV lasix 40 mg - net -0.9 L since admission. Creatine elevated to 1.24 today  - She is euvolemic on exam. Lasix held   HTN  - Currently on diltiazem gtt and metoprolol succinate 25 mg daily  - Increase metoprolol to 50 mg daily due to elevated BP   History of PE, DVT  - Continue eliquis 5 mg BID   For questions or updates, please contact [facility] Please consult [email or web address] for contact info under        Signed, Jonita Albee, PA-C

## 2023-02-13 NOTE — TOC Benefit Eligibility Note (Signed)
Pharmacy Patient Advocate Encounter  Insurance verification completed.    The patient is insured through [employer]. Patient has Medicare and is not eligible for a copay card, but may be able to apply for patient assistance, if available.    Ran test claim for Eliquis and the current 30 day co-pay is $93.53.   This test claim was processed through [facility]- copay amounts may vary at other pharmacies due to pharmacy/plan contracts, or as the patient moves through the different stages of their insurance plan.

## 2023-02-13 NOTE — Progress Notes (Signed)
Patient takes Atarax 25 mg every 6 as needed at home for anxiety.  Patient's daughter requesting to resume it.  Ordered it.  Tereasa Coop, MD [facility] 02/13/2023, 7:39 PM

## 2023-02-13 NOTE — Plan of Care (Signed)

## 2023-02-13 NOTE — Evaluation (Signed)
Physical Therapy Evaluation Patient Details Name: Brenda Stephens MRN: [id number] DOB: Apr 16, 1941 Today's Date: 02/13/2023  History of Present Illness  Pt is 82 year old presented to [facility] on  8/14 for new onset afib with rvr. PMH - DVT, PE, HTN, dementia, rt and lt total shoulder, breast CA  Clinical Impression  Pt admitted with above diagnosis and presents to PT with functional limitations due to deficits listed below (See PT problem list). Pt needs skilled PT to maximize independence and safety. Pt lives with supportive daughter and is sedentary at baseline. Will continue to push incr activity as tolerated and recommend HHPT at dc.           If plan is discharge home, recommend the following: A little help with walking and/or transfers;A little help with bathing/dressing/bathroom;Help with stairs or ramp for entrance;Assistance with cooking/housework   Can travel by private vehicle        Equipment Recommendations None recommended by PT  Recommendations for Other Services       Functional Status Assessment Patient has had a recent decline in their functional status and demonstrates the ability to make significant improvements in function in a reasonable and predictable amount of time.     Precautions / Restrictions Precautions Precautions: Fall;Other (comment) Precaution Comments: watch HR      Mobility  Bed Mobility Overal bed mobility: Needs Assistance Bed Mobility: Supine to Sit, Sit to Supine     Supine to sit: Min assist Sit to supine: Min assist   General bed mobility comments: Assist to elevate trunk. Assist to bring legs back up into bed    Transfers Overall transfer level: Needs assistance Equipment used: Rolling walker (2 wheels) Transfers: Sit to/from Stand, Bed to chair/wheelchair/BSC Sit to Stand: Contact guard assist   Step pivot transfers: Contact guard assist       General transfer comment: Assist for safety. Incr time     Ambulation/Gait Ambulation/Gait assistance: Contact guard assist Gait Distance (Feet): 10 Feet Assistive device: Rolling walker (2 wheels) Gait Pattern/deviations: Step-through pattern, Decreased step length - right, Decreased step length - left Gait velocity: decr Gait velocity interpretation: <1.31 ft/sec, indicative of household ambulator   General Gait Details: Assist for safety. Distance limited by fatigue and nausea  Stairs            Wheelchair Mobility     Tilt Bed    Modified Rankin (Stroke Patients Only)       Balance Overall balance assessment: Needs assistance Sitting-balance support: No upper extremity supported, Feet supported Sitting balance-Leahy Scale: Fair     Standing balance support: Single extremity supported, Bilateral upper extremity supported, During functional activity Standing balance-Leahy Scale: Poor Standing balance comment: UE support                             Pertinent Vitals/Pain Pain Assessment Pain Assessment: No/denies pain    Home Living Family/patient expects to be discharged to:: Private residence Living Arrangements: Children Available Help at Discharge: Family;Available 24 hours/day Type of Home: House Home Access: Stairs to enter   [employer] of Steps: 2 Alternate Level Stairs-Number of Steps: stairs, landing, stairs Home Layout: Two level;Bed/bath upstairs Home Equipment: Rolling Walker (2 wheels);Shower seat;Hand held shower head;Grab bars - toilet;Grab bars - tub/shower;Cane - single point      Prior Function Prior Level of Function : Needs assist       Physical Assist : ADLs (physical)  ADLs (physical): Dressing Mobility Comments: Modified independent with cane primarily ADLs Comments: daughter assists with dressing     Extremity/Trunk Assessment   Upper Extremity Assessment Upper Extremity Assessment: Defer to OT evaluation    Lower Extremity Assessment Lower Extremity  Assessment: Generalized weakness       Communication   Communication Communication: No apparent difficulties  Cognition Arousal: Alert Behavior During Therapy: WFL for tasks assessed/performed Overall Cognitive Status: History of cognitive impairments - at baseline                                          General Comments General comments (skin integrity, edema, etc.): VSS on RA. HR stable 70-90's    Exercises     Assessment/Plan    PT Assessment Patient needs continued PT services  PT Problem List Decreased strength;Decreased activity tolerance;Decreased balance;Decreased mobility       PT Treatment Interventions DME instruction;Gait training;Stair training;Functional mobility training;Therapeutic activities;Therapeutic exercise;Balance training;Patient/family education    PT Goals (Current goals can be found in the Care Plan section)  Acute Rehab PT Goals Patient Stated Goal: go home PT Goal Formulation: With patient/family Time For Goal Achievement: 02/27/23 Potential to Achieve Goals: Good    Frequency Min 1X/week     Co-evaluation               AM-PAC PT "6 Clicks" Mobility  Outcome Measure Help needed turning from your back to your side while in a flat bed without using bedrails?: A Little Help needed moving from lying on your back to sitting on the side of a flat bed without using bedrails?: A Little Help needed moving to and from a bed to a chair (including a wheelchair)?: A Little Help needed standing up from a chair using your arms (e.g., wheelchair or bedside chair)?: A Little Help needed to walk in hospital room?: Total Help needed climbing 3-5 steps with a railing? : Total 6 Click Score: 14    End of Session   Activity Tolerance: Patient limited by fatigue;Other (comment) (nausea) Patient left: in bed;with call bell/phone within reach;with bed alarm set;with family/visitor present Nurse Communication: Mobility status;Other  (comment) (nausea) PT Visit Diagnosis: Other abnormalities of gait and mobility (R26.89);Muscle weakness (generalized) (M62.81)    Time: 8295-6213 PT Time Calculation (min) (ACUTE ONLY): 20 min   Charges:   PT Evaluation $PT Eval Moderate Complexity: 1 Mod   PT General Charges $$ ACUTE PT VISIT: 1 Visit         [facility] PT Acute Rehabilitation Services Office [phone]   Angelina Ok [facility] 02/13/2023, 5:36 PM

## 2023-02-14 DIAGNOSIS — I4891 Unspecified atrial fibrillation: Secondary | ICD-10-CM | POA: Diagnosis not present

## 2023-02-14 LAB — BASIC METABOLIC PANEL
Anion gap: 15 (ref 5–15)
Anion gap: 11 (ref 5–15)
BUN: 17 mg/dL (ref 8–23)
BUN: 16 mg/dL (ref 8–23)
CO2: 21 mmol/L — ABNORMAL LOW (ref 22–32)
CO2: 26 mmol/L (ref 22–32)
Calcium: 9.3 mg/dL (ref 8.9–10.3)
Calcium: 9.5 mg/dL (ref 8.9–10.3)
Chloride: 98 mmol/L (ref 98–111)
Chloride: 99 mmol/L (ref 98–111)
Creatinine, Ser: 1.21 mg/dL — ABNORMAL HIGH (ref 0.44–1.00)
Creatinine, Ser: 1.32 mg/dL — ABNORMAL HIGH (ref 0.44–1.00)
GFR, Estimated: 45 mL/min — ABNORMAL LOW (ref >60)
GFR, Estimated: 41 mL/min — ABNORMAL LOW (ref >60)
Glucose, Bld: 137 mg/dL — ABNORMAL HIGH (ref 70–99)
Glucose, Bld: 139 mg/dL — ABNORMAL HIGH (ref 70–99)
Potassium: 3.7 mmol/L (ref 3.5–5.1)
Potassium: 4.2 mmol/L (ref 3.5–5.1)
Sodium: 134 mmol/L — ABNORMAL LOW (ref 135–145)
Sodium: 136 mmol/L (ref 135–145)

## 2023-02-14 LAB — CBC
HCT: 40.8 % (ref 36.0–46.0)
Hemoglobin: 13.2 g/dL (ref 12.0–15.0)
MCH: 28.9 pg (ref 26.0–34.0)
MCHC: 32.4 g/dL (ref 30.0–36.0)
MCV: 89.3 fL (ref 80.0–100.0)
Platelets: 319 10*3/uL (ref 150–400)
RBC: 4.57 MIL/uL (ref 3.87–5.11)
RDW: 13 % (ref 11.5–15.5)
WBC: 9.2 10*3/uL (ref 4.0–10.5)
nRBC: 0 % (ref 0.0–0.2)

## 2023-02-14 LAB — TROPONIN I (HIGH SENSITIVITY)
Troponin I (High Sensitivity): 13 ng/L (ref <18)
Troponin I (High Sensitivity): 13 ng/L (ref <18)
Troponin I (High Sensitivity): 14 ng/L (ref <18)

## 2023-02-14 LAB — T4, FREE: Free T4: 1.07 ng/dL (ref 0.61–1.12)

## 2023-02-14 LAB — MAGNESIUM: Magnesium: 2.1 mg/dL (ref 1.7–2.4)

## 2023-02-14 MED ORDER — AMLODIPINE BESYLATE 5 MG PO TABS
5.0000 mg | ORAL_TABLET | Freq: Every day | ORAL | Status: DC
  Administered 2023-02-14 – 2023-02-15 (×2): 5 mg via ORAL
  Filled 2023-02-14 (×3): qty 1

## 2023-02-14 MED ORDER — HYDRALAZINE HCL 20 MG/ML IJ SOLN: 5.0000 mg | Freq: Four times a day (QID) | INTRAMUSCULAR | Status: DC | PRN

## 2023-02-14 MED ORDER — NITROGLYCERIN 0.4 MG SL SUBL
SUBLINGUAL_TABLET | SUBLINGUAL | Status: AC
  Filled 2023-02-14: qty 1

## 2023-02-14 MED ORDER — NITROGLYCERIN 0.4 MG SL SUBL: 0.4000 mg | SUBLINGUAL_TABLET | SUBLINGUAL | Status: DC | PRN

## 2023-02-14 MED ORDER — VENLAFAXINE HCL ER 150 MG PO CP24
150.0000 mg | ORAL_CAPSULE | Freq: Every day | ORAL | Status: DC
  Administered 2023-02-14 – 2023-02-17 (×4): 150 mg via ORAL
  Filled 2023-02-14 (×4): qty 1

## 2023-02-14 MED ORDER — FUROSEMIDE 10 MG/ML IJ SOLN
40.0000 mg | Freq: Two times a day (BID) | INTRAMUSCULAR | Status: DC
  Administered 2023-02-14 – 2023-02-15 (×3): 40 mg via INTRAVENOUS
  Filled 2023-02-14 (×3): qty 4

## 2023-02-14 NOTE — Evaluation (Signed)
Occupational Therapy Evaluation Patient Details Name: Brenda Stephens Brenda Stephens DOB: Jun 04, 1941 Today's Date: 02/14/2023   History of Present Illness Pt is 82 year old presented to [facility] on  8/14 for new onset afib with rvr. PMH - DVT, PE, HTN, dementia, rt and lt total shoulder, breast CA   Clinical Impression   Patient lives at home with her daughter in a 2 story home with 2 STE and utilizes cane for functional mobility and reports being Independent for ADLs while daughter completes IADLs.  Patient currently requires min A for supine to sit with HOB elevated and increased time along with CGA  with RW for functional mobility.  Patient requires max A for donning socks on while seated EOB 2/2 patient reporting since hospital stay, she hasn't been able to complete.  Toileting transfer completes with CGA and use of grab bar and clothing mgmt CGA.  Patient would benefit from additional OT intervention to address ADL independence, strength, activity tolerance and overall safety.  Patinet would benefit from [facility] to address functional deficits in order for patient to return to PLOF      If plan is discharge home, recommend the following: A little help with walking and/or transfers;Assistance with cooking/housework;Assist for transportation;Help with stairs or ramp for entrance;Supervision due to cognitive status;Direct supervision/assist for medications management;Direct supervision/assist for financial management    Functional Status Assessment  Patient has had a recent decline in their functional status and demonstrates the ability to make significant improvements in function in a reasonable and predictable amount of time.  Equipment Recommendations       Recommendations for Other Services       Precautions / Restrictions Precautions Precautions: Fall;Other (comment) Precaution Comments: watch HR Restrictions Weight Bearing Restrictions: No      Mobility Bed Mobility Overal bed  mobility: Needs Assistance Bed Mobility: Supine to Sit, Sit to Supine     Supine to sit: Min assist Sit to supine: Min assist        Transfers Overall transfer level: Needs assistance Equipment used: Rolling walker (2 wheels) Transfers: Sit to/from Stand, Bed to chair/wheelchair/BSC Sit to Stand: Contact guard assist     Step pivot transfers: Contact guard assist     General transfer comment: Assist for safety. Incr time      Balance Overall balance assessment: Needs assistance Sitting-balance support: No upper extremity supported, Feet supported Sitting balance-Leahy Scale: Fair                                     ADL either performed or assessed with clinical judgement   ADL Overall ADL's : Needs assistance/impaired Eating/Feeding: Set up   Grooming: Wash/dry face;Wash/dry hands;Contact guard assist;Standing   Upper Body Bathing: Set up;Sitting   Lower Body Bathing: Moderate assistance;Sitting/lateral leans   Upper Body Dressing : Minimal assistance   Lower Body Dressing: Maximal assistance (for donning socks)   Toilet Transfer: Contact guard assist;Cueing for sequencing;Cueing for safety   Toileting- Clothing Manipulation and Hygiene: Contact guard assist       Functional mobility during ADLs: Contact guard assist;Rolling walker (2 wheels)       Vision Baseline Vision/History: 1 Wears glasses Patient Visual Report: No change from baseline       Perception         Praxis         Pertinent Vitals/Pain Pain Assessment Pain Assessment: No/denies pain  Extremity/Trunk Assessment Upper Extremity Assessment Upper Extremity Assessment: Generalized weakness   Lower Extremity Assessment Lower Extremity Assessment: Defer to PT evaluation   Cervical / Trunk Assessment Cervical / Trunk Assessment: Normal   Communication Communication Communication: No apparent difficulties   Cognition Arousal: Alert Behavior During Therapy:  WFL for tasks assessed/performed Overall Cognitive Status: History of cognitive impairments - at baseline                                       General Comments       Exercises     Shoulder Instructions      Home Living Family/patient expects to be discharged to:: Private residence Living Arrangements: Children Available Help at Discharge: Family;Available 24 hours/day Type of Home: House Home Access: Stairs to enter [employer] of Steps: 2   Home Layout: Two level;Bed/bath upstairs Alternate Level Stairs-Number of Steps: stairs, landing, stairs Alternate Level Stairs-Rails: Right Bathroom Shower/Tub: Walk-in shower;Door   Bathroom Toilet: Handicapped height     Home Equipment: Agricultural consultant (2 wheels);Shower seat;Hand held shower head;Grab bars - toilet;Grab bars - tub/shower;Cane - single point          Prior Functioning/Environment Prior Level of Function : Needs assist  Cognitive Assist : ADLs (cognitive)   ADLs (Cognitive): Intermittent cues Physical Assist : ADLs (physical)   ADLs (physical): Dressing;IADLs Mobility Comments: Modified independent with cane primarily ADLs Comments: daughter assists with dressing        OT Problem List: Decreased strength;Decreased activity tolerance;Decreased safety awareness      OT Treatment/Interventions: Self-care/ADL training;Therapeutic exercise;Therapeutic activities;Patient/family education    OT Goals(Current goals can be found in the care plan section) Acute Rehab OT Goals OT Goal Formulation: With patient Time For Goal Achievement: 02/28/23 Potential to Achieve Goals: Good  OT Frequency: Min 1X/week    Co-evaluation              AM-PAC OT "6 Clicks" Daily Activity     Outcome Measure Help from another person eating meals?: A Little Help from another person taking care of personal grooming?: A Little Help from another person toileting, which includes using toliet, bedpan,  or urinal?: A Little Help from another person bathing (including washing, rinsing, drying)?: A Little Help from another person to put on and taking off regular upper body clothing?: A Little Help from another person to put on and taking off regular lower body clothing?: A Lot 6 Click Score: 17   End of Session Equipment Utilized During Treatment: Gait belt;Rolling walker (2 wheels) Nurse Communication: Mobility status  Activity Tolerance: Patient tolerated treatment well Patient left: with call bell/phone within reach  OT Visit Diagnosis: Unsteadiness on feet (R26.81);Muscle weakness (generalized) (M62.81)                Time: 8119-1478 OT Time Calculation (min): 19 min Charges:  OT General Charges $OT Visit: 1 Visit OT Evaluation $OT Eval Moderate Complexity: 1 Mod Governor Specking OT/L  Denice Paradise 02/14/2023, 8:47 AM

## 2023-02-14 NOTE — Plan of Care (Signed)
  Problem: Clinical Measurements: Goal: Will remain free from infection Outcome: Progressing Goal: Diagnostic test results will improve Outcome: Progressing Goal: Respiratory complications will improve Outcome: Progressing Goal: Cardiovascular complication will be avoided Outcome: Progressing   

## 2023-02-14 NOTE — Progress Notes (Signed)
History of paroxysmal atrial fibrillation A-fib RVR-resolved Patient has history of atrial fibrillation.  She has been presented with A-fib RVR and Cardizem drip was started on 02/12/2023.  Heart rate is with well-controlled now 50-60.  Bedside telemetry showed atrial fibrillation rate controlled.  Bedside nurse already has been stopped the Cardizem drip around 4:45 AM. -Spoke with on-call cardiologist Dr. Brayton Layman.  Given RVR has been resolved 80s appropriate to stop the Cardizem drip.  Plan to continue Toprol-XL 50 mg daily - Patient systolic blood pressure is elevated up to 160s.  Starting Norvasc 5 mg daily. -Continue Eliquis 5 mg twice daily.  Brenda Coop, MD [facility] 02/14/2023, 5:12 AM

## 2023-02-14 NOTE — Progress Notes (Signed)
Called to bedside by NT; patient screaming and complaining of chest pain 8/10, left upper chest, non-radiating, characterized as sharp and pressure.  Patient's heart rate sustaining over 150 in atrial fibrillation and she was markedly SOB while sitting in chair.  Assisted to bed, obtained EKG and vital signs and notified charge RN and MD.  During EKG, patient's heart rate came down to 110s and her pain abated to 0.  Nitroglycerin was held at that time; gave patient's daily medications, including metoprolol.    Patient requested family be made aware.  This RN will contact patient's daughter by phone.  Will continue to monitor closely.

## 2023-02-14 NOTE — Progress Notes (Signed)
PROGRESS NOTE    Brenda Stephens  GLO:[id number] DOB: 1940/10/07 DOA: 02/11/2023 PCP: Verlee Rossetti, PA-C  Outpatient Specialists:     Brief Narrative:  Patient is an 82 year old female with past medical history significant for pulmonary embolism, DVT on Eliquis but noncompliant, hypertension, hypothyroidism, hyperlipidemia and history of dementia.  Patient was noted to be in A-fib with RVR at the cardiology office and was asked to come to the hospital for further evaluation and management.  There was also associated chest heaviness and leg swelling.  Echocardiogram revealed normal ejection fraction, suspected acute on chronic diastolic congestive heart failure.  Cardiology team has been consulted.  Cardiology team is directing care.  Patient is already on anticoagulation.  Heart rate is controlled.  02/12/2023: Patient seen alongside patient's daughter.  Need to comply with DOAC was discussed with the patient and patient's daughter.  No chest pain.  Volume overload seems to be improving.  Cardiology input is appreciated.  02/13/2023: Patient seen alongside patient's daughter.  Patient reports musculoskeletal chest pain.  Patient is still on Cardizem drip.  Heart rate is controlled.  Cardiology is directing care.  8/17: DC cardizem drip, on oral metoprolol succinate 50 mg  Assessment & Plan:   Principal Problem:   Rapid atrial fibrillation (HCC) Active Problems:   Leg swelling   Hypothyroidism   Hyperlipidemia   Essential hypertension   Dementia without behavioral disturbance (HCC)   History of pulmonary embolism   History of DVT of lower extremity   Atrial fibrillation with RVR (HCC)  Rapid atrial fibrillation (HCC) LVEF normal, symptomatic with afib.  -Cardizem drip DC, on metoprolol succinate 50 mg  -Eliquis -Heart rate is controlled -TEE with DCCV planned monday   Leg swelling/acute on chronic diastolic CHF: -Cardiology is managing. With lasix 40 mg BID -Strict I's and  O's. -Monitor renal function and electrolytes   History of DVT of lower extremity Continue with Eliquis 5 mg bid   History of pulmonary embolism Continue with Eliquis 5 mg bid   Dementia without behavioral disturbance (HCC) Continue with aricept and namenda.   Essential hypertension Amlodipine 5 mg, metoprolol started.    Hyperlipidemia Stable.   Hypothyroidism TSH is 7.963 Repeat TSH and free T4 in 4 to 6 weeks (when patient is out of the hospital).  MDD -restarted venlafaxine 150 mg once daily  DVT prophylaxis: Eliquis. Code Status: Full code. Family Communication: Daughter. Disposition Plan: Home eventually.   Consultants:  Cardiology.  Procedures:  Echocardiogram revealed: 1. Left ventricular ejection fraction, by estimation, is 70 to 75%. The  left ventricle has hyperdynamic function. The left ventricle has no  regional wall motion abnormalities. Left ventricular diastolic parameters  are indeterminate.   2. Right ventricular systolic function is normal. The right ventricular  size is normal.   3. Left atrial size was mildly dilated.   4. The mitral valve is normal in structure. No evidence of mitral valve  regurgitation. No evidence of mitral stenosis.   5. The aortic valve is tricuspid. There is mild calcification of the  aortic valve. Aortic valve regurgitation is not visualized. No aortic  stenosis is present.   6. The inferior vena cava is normal in size with greater than 50%  respiratory variability, suggesting right atrial pressure of 3 mmHg.   Antimicrobials:  None.   Subjective: No chest pain. No palpitations. SOB with exertion.   Objective: Vitals:   02/14/23 0746 02/14/23 0855 02/14/23 0904 02/14/23 1305  BP:  (!) 150/66 Marland Kitchen)  155/70 (!) 160/79  Pulse: 100 (!) 153 87 84  Resp:  (!) 28  16  Temp:      TempSrc:      SpO2: 96%  99% 97%  Weight: 92.2 kg     Height:        Intake/Output Summary (Last 24 hours) at 02/14/2023 1719 Last  data filed at 02/13/2023 1939 Gross per 24 hour  Intake --  Output 100 ml  Net -100 ml   Filed Weights   02/12/23 0500 02/13/23 0500 02/14/23 0746  Weight: 86 kg 86.1 kg 92.2 kg    Examination:  General exam: Appears calm and comfortable.  Patient is obese. Respiratory system: Clear to auscultation.  Cardiovascular system: S1 & S2, irregularly irregular. Gastrointestinal system: Abdomen is obese, soft and nontender.   Central nervous system: Awake and alert.  Moves all extremities.   Extremities: Mild edema of lower extremities.  Data Reviewed: I have personally reviewed following labs and imaging studies  CBC: Recent Labs  Lab 02/11/23 1820 02/13/23 0208 02/14/23 1346  WBC 7.4 10.6* 9.2  NEUTROABS  --  8.2*  --   HGB 13.2 13.5 13.2  HCT 41.0 40.3 40.8  MCV 88.4 88.2 89.3  PLT 306 316 319   Basic Metabolic Panel: Recent Labs  Lab 02/11/23 1820 02/12/23 0821 02/13/23 0208 02/14/23 0253 02/14/23 1346  NA 138 135 132* 134* 136  K 3.9 4.0 3.4* 3.7 4.2  CL 98 103 97* 98 99  CO2 22 20* 19* 21* 26  GLUCOSE 116* 150* 133* 137* 139*  BUN 12 10 14 17 16   CREATININE 1.07* 1.14* 1.24* 1.21* 1.32*  CALCIUM 10.1 9.4 9.2 9.3 9.5  MG  --  2.1 1.8 2.1  --   PHOS  --   --  3.6  --   --    GFR: Estimated Creatinine Clearance: 36 mL/min (A) (by C-G formula based on SCr of 1.32 mg/dL (H)). Liver Function Tests: Recent Labs  Lab 02/12/23 0821 02/13/23 0208  AST 28  --   ALT 26  --   ALKPHOS 51  --   BILITOT 0.8  --   PROT 6.9  --   ALBUMIN 3.6 3.5   No results for input(s): "LIPASE", "AMYLASE" in the last 168 hours. No results for input(s): "AMMONIA" in the last 168 hours. Coagulation Profile: No results for input(s): "INR", "PROTIME" in the last 168 hours. Cardiac Enzymes: No results for input(s): "CKTOTAL", "CKMB", "CKMBINDEX", "TROPONINI" in the last 168 hours. BNP (last 3 results) Recent Labs    02/21/22 1021  PROBNP 35.0   HbA1C: No results for input(s):  "HGBA1C" in the last 72 hours. CBG: No results for input(s): "GLUCAP" in the last 168 hours. Lipid Profile: No results for input(s): "CHOL", "HDL", "LDLCALC", "TRIG", "CHOLHDL", "LDLDIRECT" in the last 72 hours. Thyroid Function Tests: Recent Labs    02/11/23 2055 02/14/23 0920  TSH 7.963*  --   FREET4  --  1.07   Anemia Panel: No results for input(s): "VITAMINB12", "FOLATE", "FERRITIN", "TIBC", "IRON", "RETICCTPCT" in the last 72 hours. Urine analysis:    Component Value Date/Time   COLORURINE YELLOW 06/04/2010 0845   APPEARANCEUR CLOUDY (A) 06/04/2010 0845   LABSPEC 1.011 06/04/2010 0845   PHURINE 7.5 06/04/2010 0845   GLUCOSEU NEGATIVE 06/04/2010 0845   HGBUR NEGATIVE 06/04/2010 0845   BILIRUBINUR NEGATIVE 06/04/2010 0845   KETONESUR NEGATIVE 06/04/2010 0845   PROTEINUR NEGATIVE 06/04/2010 0845   UROBILINOGEN 0.2 06/04/2010 0845  NITRITE NEGATIVE 06/04/2010 0845   LEUKOCYTESUR LARGE (A) 06/04/2010 0845   Sepsis Labs: @LABRCNTIP (procalcitonin:4,lacticidven:4)  )No results found for this or any previous visit (from the past 240 hour(s)).       Radiology Studies: No results found.      Scheduled Meds:  amLODipine  5 mg Oral Daily   apixaban  5 mg Oral BID   donepezil  10 mg Oral QHS   furosemide  40 mg Intravenous BID   memantine  10 mg Oral BID   metoprolol succinate  50 mg Oral Daily   pantoprazole  40 mg Oral Daily   venlafaxine XR  150 mg Oral Daily   Continuous Infusions:     LOS: 2 days    Time spent: 35 minutes.    Marin Olp MD MPH [facility]

## 2023-02-14 NOTE — Progress Notes (Signed)
Attempted call to Dearra Cobos, patient's daughter at [phone].  Call went to voicemail and unable to leave message due to full mailbox.

## 2023-02-14 NOTE — Progress Notes (Addendum)
Patient Profile:    Brenda Stephens is a 82 y.o. female with a hx of palpitations, lower extremity edema, chest pain with normal coronary arteries '21, bilateral PE/DVT on Eliquis, however has been inconsistently compliant presenting with acute heart failure as an outpatient and found to be newly identified atypical atrial flutter (see below) with upright flutter waves in lead V1 and in the inferior leads and  normal LVEF   Scheduled for TEE cardioversion 8/19          SUBJECTIVE: Still very short of breath with minimal effort.  Has had chest discomfort nonstop for days, it waxes and wanes. No cough.  Edema is better.  Past Medical History:  Diagnosis Date   Acute deep vein thrombosis (DVT) of proximal vein of right lower extremity (HCC) 07/02/2022   Acute pulmonary embolism (HCC) 07/15/2022   Allergy    latex   Anemia    Anxiety    Arthritis    Bilateral pulmonary embolism (HCC) 06/30/2022   Cancer (HCC) 01/2017   right breast cancer   Depression    Dyslipidemia    Eustachian tube dysfunction    GERD (gastroesophageal reflux disease)    Grade I diastolic dysfunction 07/15/2022   Hx of cardiovascular stress test    Myoview 10/13:  apical thinning, EF 72%, no ischemia   Hx of colonic polyps    Dr Ewing Schlein   Hypertension    Hypothyroidism    Other abnormal glucose    Pulmonary embolism (HCC) 06/30/2022    Scheduled Meds:  Scheduled Meds:  amLODipine  5 mg Oral Daily   apixaban  5 mg Oral BID   donepezil  10 mg Oral QHS   memantine  10 mg Oral BID   metoprolol succinate  50 mg Oral Daily   nitroGLYCERIN       pantoprazole  40 mg Oral Daily   Continuous Infusions: acetaminophen **OR** acetaminophen, hydrOXYzine, melatonin, nitroGLYCERIN, nitroGLYCERIN, ondansetron **OR** ondansetron (ZOFRAN) IV    PHYSICAL EXAM Vitals:   02/14/23 0448 02/14/23 0517 02/14/23 0733 02/14/23 0746  BP: (!) 163/77 (!) 159/68 129/66   Pulse: 60 70 87 100  Resp:  20    Temp:   98.2 F (36.8 C) (!) 97.5 F (36.4 C)   TempSrc:  Oral Oral   SpO2: 94% 96% 97% 96%  Weight:    92.2 kg  Height:        Well developed and morbid obese in no acute distress HENT normal Neck supple with JVP-8-10 cm positive HJR Clear Regular rate and rhythm, no murmurs or gallops Abd-soft with active BS No Clubbing cyanosis trace edema Skin-warm and dry A & Oriented  Grossly normal sensory and motor function    TELEMETRY: Reviewed personnally pt in atrial flutter with a controlled ventricular rate:  ECG personally reviewed 8/17 atrial flutter that now is typical with upright flutter waves lead V1 and negative flutter waves in the inferior leads (?  Lead placement error previously)  Intake/Output Summary (Last 24 hours) at 02/14/2023 0913 Last data filed at 02/13/2023 1939 Gross per 24 hour  Intake 329.36 ml  Output 400 ml  Net -70.64 ml    LABS: Basic Metabolic Panel: Recent Labs  Lab 02/11/23 1820 02/11/23 1820 02/12/23 0821 02/13/23 0208 02/14/23 0253  NA 138  --  135 132* 134*  K 3.9  --  4.0 3.4* 3.7  CL 98  --  103 97* 98  CO2 22  --  20* 19*  21*  GLUCOSE 116*  --  150* 133* 137*  BUN 12  --  10 14 17   CREATININE 1.07*  --  1.14* 1.24* 1.21*  CALCIUM 10.1  --  9.4 9.2 9.3  MG  --    < > 2.1 1.8 2.1  PHOS  --   --   --  3.6  --    < > = values in this interval not displayed.   Cardiac Enzymes: No results for input(s): "CKTOTAL", "CKMB", "CKMBINDEX", "TROPONINI" in the last 72 hours. CBC: Recent Labs  Lab 02/11/23 1820 02/13/23 0208  WBC 7.4 10.6*  NEUTROABS  --  8.2*  HGB 13.2 13.5  HCT 41.0 40.3  MCV 88.4 88.2  PLT 306 316   PROTIME: No results for input(s): "LABPROT", "INR" in the last 72 hours. Liver Function Tests: Recent Labs    02/12/23 0821 02/13/23 0208  AST 28  --   ALT 26  --   ALKPHOS 51  --   BILITOT 0.8  --   PROT 6.9  --   ALBUMIN 3.6 3.5   No results for input(s): "LIPASE", "AMYLASE" in the last 72 hours. BNP: BNP  (last 3 results) Recent Labs    06/30/22 0021 02/11/23 1820  BNP 284.4* 202.3*    ProBNP (last 3 results) Recent Labs    02/21/22 1021  PROBNP 35.0     Thyroid Function Tests: Recent Labs    02/11/23 2055  TSH 7.963*   Anemia Panel:      ASSESSMENT AND PLAN:  Atrial flutter-new onset probably difficult  Congestive heart failure acute/chronic/diastolic  Hypertension  History of pulmonary embolism  Anticoagulation therapy with inconsistent compliance  Chest pain   Persistent symptoms of congestive heart failure with her atrial flutter.  Will begin aggressive IV diuresis for the next 24-48 hours.  Plan is for TEE cardioversion on Monday.  Appropriate.  Will have Dr. Leonia Reeves look at her on Monday to consider outpatient catheter ablation of her atrial flutter substrate (notably the ECG from admission and yesterday are distinctly different as relates to inferior morphology of flutter waves   Her chest pain has been persistent for days, and on initial survey 8/14 was unremarkable.  Will repeat x 1 with pursue that is related to her arrhythmia   Signed, Sherryl Manges MD  02/14/2023

## 2023-02-15 DIAGNOSIS — I4891 Unspecified atrial fibrillation: Secondary | ICD-10-CM | POA: Diagnosis not present

## 2023-02-15 LAB — CBC
HCT: 41.9 % (ref 36.0–46.0)
Hemoglobin: 13.7 g/dL (ref 12.0–15.0)
MCH: 29.7 pg (ref 26.0–34.0)
MCHC: 32.7 g/dL (ref 30.0–36.0)
MCV: 90.9 fL (ref 80.0–100.0)
Platelets: 289 10*3/uL (ref 150–400)
RBC: 4.61 MIL/uL (ref 3.87–5.11)
RDW: 12.9 % (ref 11.5–15.5)
WBC: 9.7 10*3/uL (ref 4.0–10.5)
nRBC: 0 % (ref 0.0–0.2)

## 2023-02-15 LAB — BASIC METABOLIC PANEL WITH GFR
Anion gap: 14 (ref 5–15)
BUN: 23 mg/dL (ref 8–23)
CO2: 24 mmol/L (ref 22–32)
Calcium: 9.4 mg/dL (ref 8.9–10.3)
Chloride: 98 mmol/L (ref 98–111)
Creatinine, Ser: 1.58 mg/dL — ABNORMAL HIGH (ref 0.44–1.00)
GFR, Estimated: 33 mL/min — ABNORMAL LOW
Glucose, Bld: 133 mg/dL — ABNORMAL HIGH (ref 70–99)
Potassium: 3.8 mmol/L (ref 3.5–5.1)
Sodium: 136 mmol/L (ref 135–145)

## 2023-02-15 LAB — MAGNESIUM: Magnesium: 1.8 mg/dL (ref 1.7–2.4)

## 2023-02-15 MED ORDER — TAMSULOSIN HCL 0.4 MG PO CAPS
0.4000 mg | ORAL_CAPSULE | Freq: Every day | ORAL | Status: DC
  Administered 2023-02-15 – 2023-02-16 (×2): 0.4 mg via ORAL
  Filled 2023-02-15 (×2): qty 1

## 2023-02-15 MED ORDER — OXYBUTYNIN CHLORIDE 5 MG PO TABS
5.0000 mg | ORAL_TABLET | Freq: Two times a day (BID) | ORAL | Status: DC
  Administered 2023-02-15 – 2023-02-17 (×4): 5 mg via ORAL
  Filled 2023-02-15 (×4): qty 1

## 2023-02-15 MED ORDER — DICLOFENAC SODIUM 1 % EX GEL
1.0000 | Freq: Four times a day (QID) | CUTANEOUS | Status: DC | PRN
  Administered 2023-02-15 – 2023-02-16 (×3): 1 via TOPICAL
  Filled 2023-02-15: qty 100

## 2023-02-15 MED ORDER — AMLODIPINE BESYLATE 2.5 MG PO TABS
2.5000 mg | ORAL_TABLET | Freq: Once | ORAL | Status: AC
  Administered 2023-02-15: 2.5 mg via ORAL
  Filled 2023-02-15: qty 1

## 2023-02-15 MED ORDER — LACTATED RINGERS IV BOLUS: 750.0000 mL | Freq: Once | INTRAVENOUS | Status: DC

## 2023-02-15 MED ORDER — LACTATED RINGERS IV BOLUS
1000.0000 mL | Freq: Once | INTRAVENOUS | Status: AC
  Administered 2023-02-15: 1000 mL via INTRAVENOUS

## 2023-02-15 MED ORDER — AMLODIPINE BESYLATE 5 MG PO TABS
7.5000 mg | ORAL_TABLET | Freq: Every day | ORAL | Status: DC
  Administered 2023-02-16 – 2023-02-17 (×2): 7.5 mg via ORAL
  Filled 2023-02-15 (×2): qty 1

## 2023-02-15 NOTE — Plan of Care (Signed)

## 2023-02-15 NOTE — Progress Notes (Signed)
Patient Profile:    Brenda Stephens is a 82 y.o. female with a hx of palpitations, lower extremity edema, chest pain with normal coronary arteries '21, bilateral PE/DVT on Eliquis, however has been inconsistently compliant presenting with acute heart failure as an outpatient and found to be newly identified atypical atrial flutter (see below) with upright flutter waves in lead V1 and in the inferior leads and  normal LVEF   Scheduled for TEE cardioversion 8/19  SUBJECTIVE:  Feeling somewhat better.  I am perplexed however to see your on 250 cc of lactated Ringer's per hour  Past Medical History:  Diagnosis Date   Acute deep vein thrombosis (DVT) of proximal vein of right lower extremity (HCC) 07/02/2022   Acute pulmonary embolism (HCC) 07/15/2022   Allergy    latex   Anemia    Anxiety    Arthritis    Bilateral pulmonary embolism (HCC) 06/30/2022   Cancer (HCC) 01/2017   right breast cancer   Depression    Dyslipidemia    Eustachian tube dysfunction    GERD (gastroesophageal reflux disease)    Grade I diastolic dysfunction 07/15/2022   Hx of cardiovascular stress test    Myoview 10/13:  apical thinning, EF 72%, no ischemia   Hx of colonic polyps    Dr Ewing Schlein   Hypertension    Hypothyroidism    Other abnormal glucose    Pulmonary embolism (HCC) 06/30/2022    Scheduled Meds:  Scheduled Meds:  [START ON 02/16/2023] amLODipine  7.5 mg Oral Daily   apixaban  5 mg Oral BID   donepezil  10 mg Oral QHS   memantine  10 mg Oral BID   metoprolol succinate  50 mg Oral Daily   pantoprazole  40 mg Oral Daily   venlafaxine XR  150 mg Oral Daily   Continuous Infusions: acetaminophen **OR** acetaminophen, diclofenac Sodium, hydrALAZINE, hydrOXYzine, melatonin, nitroGLYCERIN, ondansetron **OR** ondansetron (ZOFRAN) IV    PHYSICAL EXAM Vitals:   02/14/23 2245 02/15/23 0326 02/15/23 0525 02/15/23 0726  BP: (!) 160/88 (!) 147/78  (!) 173/83  Pulse: 78 84  76  Resp: 18 18  16    Temp: 98.5 F (36.9 C) 98.5 F (36.9 C)  (!) 97.5 F (36.4 C)  TempSrc: Oral Oral  Oral  SpO2: 99% 99%  97%  Weight:   92 kg   Height:        Well developed and nourished in no acute distress HENT normal Neck supple  Clear Regular rate and rhythm, no murmurs or gallops Abd-soft with active BS No Clubbing cyanosis edema Skin-warm and dry A & Oriented  Grossly normal sensory and motor function     TELEMETRY: Reverted to sinus rhythm   ECG 8/18--sinus rhythm  ECG personally reviewed 8/17 atrial flutter that now is typical with upright flutter waves lead V1 and negative flutter waves in the inferior leads (?  Lead placement error previously)  Intake/Output Summary (Last 24 hours) at 02/15/2023 1105 Last data filed at 02/15/2023 0911 Gross per 24 hour  Intake 360 ml  Output 2470 ml  Net -2110 ml    LABS: Basic Metabolic Panel: Recent Labs  Lab 02/11/23 1820 02/11/23 1820 02/12/23 0821 02/13/23 0208 02/14/23 0253 02/14/23 1346 02/15/23 0036  NA 138  --  135 132* 134* 136 136  K 3.9  --  4.0 3.4* 3.7 4.2 3.8  CL 98  --  103 97* 98 99 98  CO2 22  --  20* 19*  21* 26 24  GLUCOSE 116*  --  150* 133* 137* 139* 133*  BUN 12  --  10 14 17 16 23   CREATININE 1.07*  --  1.14* 1.24* 1.21* 1.32* 1.58*  CALCIUM 10.1  --  9.4 9.2 9.3 9.5 9.4  MG  --    < > 2.1 1.8 2.1  --  1.8  PHOS  --   --   --  3.6  --   --   --    < > = values in this interval not displayed.   Cardiac Enzymes: No results for input(s): "CKTOTAL", "CKMB", "CKMBINDEX", "TROPONINI" in the last 72 hours. CBC: Recent Labs  Lab 02/11/23 1820 02/13/23 0208 02/14/23 1346 02/15/23 0036  WBC 7.4 10.6* 9.2 9.7  NEUTROABS  --  8.2*  --   --   HGB 13.2 13.5 13.2 13.7  HCT 41.0 40.3 40.8 41.9  MCV 88.4 88.2 89.3 90.9  PLT 306 316 319 289   PROTIME: No results for input(s): "LABPROT", "INR" in the last 72 hours. Liver Function Tests: Recent Labs    02/13/23 0208  ALBUMIN 3.5   No results for  input(s): "LIPASE", "AMYLASE" in the last 72 hours. BNP: BNP (last 3 results) Recent Labs    06/30/22 0021 02/11/23 1820  BNP 284.4* 202.3*    ProBNP (last 3 results) Recent Labs    02/21/22 1021  PROBNP 35.0     Thyroid Function Tests: No results for input(s): "TSH", "T4TOTAL", "T3FREE", "THYROIDAB" in the last 72 hours.  Invalid input(s): "FREET3"  Anemia Panel:      ASSESSMENT AND PLAN:  Atrial flutter-new onset probably difficult  Congestive heart failure acute/chronic/diastolic  Hypertension  History of pulmonary embolism  Anticoagulation therapy with inconsistent compliance  Chest pain  AKI Estimated Creatinine Clearance: 30.1 mL/min (A) (by C-G formula based on SCr of 1.58 mg/dL (H)).   Spontaneous reversion to sinus rhythm.  Hopefully her heart failure symptoms will abate more rapidly now and perplexed at the order for lactated Ringer's.  Will reach out to the primary service.  Have decreased the fluid bolus for now.  Blood pressure is elevated.  Amlodipine dose was increased (half-life is about 40 hours so we would not expect to see much benefit anytime soon)    Renal function has deteriorated some.  Diuretics on hold for now.  Again, hoping that hemodynamics will improve now that she has reverted to sinus rhythm.  Hopefully can discharge in the morning.  Will discuss catheter ablation for atrial flutter.  Troponin was persistently negative not withstanding ongoing chest pain-- noncardiac or at least nonischemic     Signed, Sherryl Manges MD  02/15/2023

## 2023-02-15 NOTE — TOC Progression Note (Addendum)
Transition of Care [facility]) - Progression Note    Patient Details  Name: Brenda Stephens MRN: [id number] Date of Birth: 04/04/1941  Transition of [facility]) CM/SW Contact  Ronny Bacon, RN Phone Number: 02/15/2023, 12:58 PM  Clinical Narrative:  Spoke with patient and daughter at bedside. Patient given patient assistance application form for Eliquis. Discussed physical therapy recommendations and patient is open to home health PT and OT. Patient used [facility] in January and would like to use them again. [facility] will check to see if can accept patient again at discharge.   1313: Bedside commode ordered through [employer].[facility] able to accept patient for [facility] PT/OT, will need HH orders.         Expected Discharge Plan and Services                                               Social Determinants of Health (SDOH) Interventions SDOH Screenings   Food Insecurity: No Food Insecurity (02/12/2023)  Housing: Patient Declined (02/12/2023)  Transportation Needs: No Transportation Needs (02/12/2023)  Utilities: Not At Risk (02/12/2023)  Financial Resource Strain: Low Risk  (03/12/2021)   Received from [facility] visits prior to 08/30/2022., [facility] visits prior to 08/30/2022.  Physical Activity: Inactive (03/12/2021)   Received from [facility] visits prior to 08/30/2022., [facility] visits prior to 08/30/2022.  Social Connections: Moderately Isolated (03/12/2021)   Received from [facility] visits prior to 08/30/2022., [facility] visits prior to 08/30/2022.  Stress: Stress Concern Present (03/12/2021)   [facility] prior to 08/30/2022., [facility] visits prior to 08/30/2022.  Tobacco Use: Medium Risk (02/12/2023)    Readmission Risk Interventions     No data to display

## 2023-02-15 NOTE — Progress Notes (Addendum)
PROGRESS NOTE    Brenda Stephens  JWJ:[id number] DOB: 1940-11-17 DOA: 02/11/2023 PCP: Verlee Rossetti, PA-C  Outpatient Specialists:     Brief Narrative:  Patient is an 82 year old female with past medical history significant for pulmonary embolism, DVT on Eliquis but noncompliant, hypertension, hypothyroidism, hyperlipidemia and history of dementia.  Patient was noted to be in A-fib with RVR at the cardiology office and was asked to come to the hospital for further evaluation and management.  There was also associated chest heaviness and leg swelling.  Echocardiogram revealed normal ejection fraction, suspected acute on chronic diastolic congestive heart failure.  Cardiology team has been consulted.  Cardiology team is directing care.  Patient is already on anticoagulation.  Heart rate is controlled.  02/12/2023: Patient seen alongside patient's daughter.  Need to comply with DOAC was discussed with the patient and patient's daughter.  No chest pain.  Volume overload seems to be improving.  Cardiology input is appreciated.  02/13/2023: Patient seen alongside patient's daughter.  Patient reports musculoskeletal chest pain.  Patient is still on Cardizem drip.  Heart rate is controlled.  Cardiology is directing care.  8/17: DC cardizem drip, on oral metoprolol succinate 50 mg  8/18 Converted back to sinus rhythm spontaneously. With AKI,stopped lasix and given some fluid back   Assessment & Plan:   Principal Problem:   Rapid atrial fibrillation (HCC) Active Problems:   Leg swelling   Hypothyroidism   Hyperlipidemia   Essential hypertension   Dementia without behavioral disturbance (HCC)   History of pulmonary embolism   History of DVT of lower extremity   Atrial fibrillation with RVR (HCC)  Rapid atrial fibrillation (HCC) LVEF normal, symptomatic with afib. Spontaneously cardioverted 8/17-8/18.  -on metoprolol succinate 50 mg  -Eliquis  AKI Leg swelling/acute on chronic diastolic  CHF Cr increased 8/18 suspected due to pre-renal AKI after diuresis. NN 2 L with 4 x unmeasured outputs. Given some fluid back 8/18. TTE on 8/15 with collapsible IVC, hyperdynamic LV. Hold additional fluid.  -hold lasix and additional fluid -Strict I's and O's. -Monitor renal function and electrolytes   History of DVT of lower extremity Continue with Eliquis 5 mg bid   History of pulmonary embolism Continue with Eliquis 5 mg bid   Dementia without behavioral disturbance (HCC) Continue with aricept and namenda.   Essential hypertension Poorly controlled.  Amlodipine 5 mg increased to 7.5 mg once daily, metoprolol as above.    Hyperlipidemia Stable.   Hypothyroidism TSH is 7.963 Repeat TSH and free T4 in 4 to 6 weeks (when patient is out of the hospital).  MDD -restarted venlafaxine 150 mg once daily  DVT prophylaxis: Eliquis. Code Status: Full code. Family Communication: Daughter. Disposition Plan: Home eventually.   Consultants:  Cardiology.  Procedures:  Echocardiogram revealed: 1. Left ventricular ejection fraction, by estimation, is 70 to 75%. The  left ventricle has hyperdynamic function. The left ventricle has no  regional wall motion abnormalities. Left ventricular diastolic parameters  are indeterminate.   2. Right ventricular systolic function is normal. The right ventricular  size is normal.   3. Left atrial size was mildly dilated.   4. The mitral valve is normal in structure. No evidence of mitral valve  regurgitation. No evidence of mitral stenosis.   5. The aortic valve is tricuspid. There is mild calcification of the  aortic valve. Aortic valve regurgitation is not visualized. No aortic  stenosis is present.   6. The inferior vena cava is normal in size  with greater than 50%  respiratory variability, suggesting right atrial pressure of 3 mmHg.   Antimicrobials:  None.   Subjective: No chest pain. No palpitations. SOB with exertion.    Objective: Vitals:   02/15/23 0326 02/15/23 0525 02/15/23 0726 02/15/23 1133  BP: (!) 147/78  (!) 173/83   Pulse: 84  76 95  Resp: 18  16 18   Temp: 98.5 F (36.9 C)  (!) 97.5 F (36.4 C) 98.2 F (36.8 C)  TempSrc: Oral  Oral Oral  SpO2: 99%  97% 96%  Weight:  92 kg    Height:        Intake/Output Summary (Last 24 hours) at 02/15/2023 1323 Last data filed at 02/15/2023 1124 Gross per 24 hour  Intake 360 ml  Output 2570 ml  Net -2210 ml   Filed Weights   02/13/23 0500 02/14/23 0746 02/15/23 0525  Weight: 86.1 kg 92.2 kg 92 kg    Examination:  General exam: Appears calm and comfortable.  Patient is obese. Respiratory system: Clear to auscultation.  Cardiovascular system: S1 & S2, irregularly irregular. Gastrointestinal system: Abdomen is obese, soft and nontender.   Central nervous system: Awake and alert.  Moves all extremities.   Extremities: Mild edema of lower extremities.  Data Reviewed: I have personally reviewed following labs and imaging studies  CBC: Recent Labs  Lab 02/11/23 1820 02/13/23 0208 02/14/23 1346 02/15/23 0036  WBC 7.4 10.6* 9.2 9.7  NEUTROABS  --  8.2*  --   --   HGB 13.2 13.5 13.2 13.7  HCT 41.0 40.3 40.8 41.9  MCV 88.4 88.2 89.3 90.9  PLT 306 316 319 289   Basic Metabolic Panel: Recent Labs  Lab 02/12/23 0821 02/13/23 0208 02/14/23 0253 02/14/23 1346 02/15/23 0036  NA 135 132* 134* 136 136  K 4.0 3.4* 3.7 4.2 3.8  CL 103 97* 98 99 98  CO2 20* 19* 21* 26 24  GLUCOSE 150* 133* 137* 139* 133*  BUN 10 14 17 16 23   CREATININE 1.14* 1.24* 1.21* 1.32* 1.58*  CALCIUM 9.4 9.2 9.3 9.5 9.4  MG 2.1 1.8 2.1  --  1.8  PHOS  --  3.6  --   --   --    GFR: Estimated Creatinine Clearance: 30.1 mL/min (A) (by C-G formula based on SCr of 1.58 mg/dL (H)). Liver Function Tests: Recent Labs  Lab 02/12/23 0821 02/13/23 0208  AST 28  --   ALT 26  --   ALKPHOS 51  --   BILITOT 0.8  --   PROT 6.9  --   ALBUMIN 3.6 3.5   No results for  input(s): "LIPASE", "AMYLASE" in the last 168 hours. No results for input(s): "AMMONIA" in the last 168 hours. Coagulation Profile: No results for input(s): "INR", "PROTIME" in the last 168 hours. Cardiac Enzymes: No results for input(s): "CKTOTAL", "CKMB", "CKMBINDEX", "TROPONINI" in the last 168 hours. BNP (last 3 results) Recent Labs    02/21/22 1021  PROBNP 35.0   HbA1C: No results for input(s): "HGBA1C" in the last 72 hours. CBG: No results for input(s): "GLUCAP" in the last 168 hours. Lipid Profile: No results for input(s): "CHOL", "HDL", "LDLCALC", "TRIG", "CHOLHDL", "LDLDIRECT" in the last 72 hours. Thyroid Function Tests: Recent Labs    02/14/23 0920  FREET4 1.07   Anemia Panel: No results for input(s): "VITAMINB12", "FOLATE", "FERRITIN", "TIBC", "IRON", "RETICCTPCT" in the last 72 hours. Urine analysis:    Component Value Date/Time   COLORURINE YELLOW 06/04/2010 0845  APPEARANCEUR CLOUDY (A) 06/04/2010 0845   LABSPEC 1.011 06/04/2010 0845   PHURINE 7.5 06/04/2010 0845   GLUCOSEU NEGATIVE 06/04/2010 0845   HGBUR NEGATIVE 06/04/2010 0845   BILIRUBINUR NEGATIVE 06/04/2010 0845   KETONESUR NEGATIVE 06/04/2010 0845   PROTEINUR NEGATIVE 06/04/2010 0845   UROBILINOGEN 0.2 06/04/2010 0845   NITRITE NEGATIVE 06/04/2010 0845   LEUKOCYTESUR LARGE (A) 06/04/2010 0845   Sepsis Labs: @LABRCNTIP (procalcitonin:4,lacticidven:4)  )No results found for this or any previous visit (from the past 240 hour(s)).       Radiology Studies: No results found.      Scheduled Meds:  [START ON 02/16/2023] amLODipine  7.5 mg Oral Daily   apixaban  5 mg Oral BID   donepezil  10 mg Oral QHS   memantine  10 mg Oral BID   metoprolol succinate  50 mg Oral Daily   pantoprazole  40 mg Oral Daily   venlafaxine XR  150 mg Oral Daily   Continuous Infusions:     LOS: 3 days    Time spent: 35 minutes.    Marin Olp MD MPH [facility]

## 2023-02-16 ENCOUNTER — Ambulatory Visit (HOSPITAL_BASED_OUTPATIENT_CLINIC_OR_DEPARTMENT_OTHER): Payer: MEDICARE

## 2023-02-16 ENCOUNTER — Encounter (HOSPITAL_COMMUNITY)
Admission: EM | Disposition: A | Discharge status: Home / Self Care | Payer: Self-pay | Source: Home / Self Care | Attending: Internal Medicine

## 2023-02-16 ENCOUNTER — Telehealth: Payer: Self-pay | Admitting: Urology

## 2023-02-16 DIAGNOSIS — N179 Acute kidney failure, unspecified: Secondary | ICD-10-CM

## 2023-02-16 DIAGNOSIS — I4891 Unspecified atrial fibrillation: Secondary | ICD-10-CM | POA: Diagnosis not present

## 2023-02-16 HISTORY — DX: Acute kidney failure, unspecified: N17.9

## 2023-02-16 LAB — BASIC METABOLIC PANEL
Anion gap: 13 (ref 5–15)
BUN: 29 mg/dL — ABNORMAL HIGH (ref 8–23)
CO2: 25 mmol/L (ref 22–32)
Calcium: 8.9 mg/dL (ref 8.9–10.3)
Chloride: 92 mmol/L — ABNORMAL LOW (ref 98–111)
Creatinine, Ser: 1.71 mg/dL — ABNORMAL HIGH (ref 0.44–1.00)
GFR, Estimated: 30 mL/min — ABNORMAL LOW (ref >60)
Glucose, Bld: 129 mg/dL — ABNORMAL HIGH (ref 70–99)
Potassium: 3.3 mmol/L — ABNORMAL LOW (ref 3.5–5.1)
Sodium: 130 mmol/L — ABNORMAL LOW (ref 135–145)

## 2023-02-16 LAB — CBC
HCT: 36.1 % (ref 36.0–46.0)
Hemoglobin: 11.9 g/dL — ABNORMAL LOW (ref 12.0–15.0)
MCH: 28.6 pg (ref 26.0–34.0)
MCHC: 33 g/dL (ref 30.0–36.0)
MCV: 86.8 fL (ref 80.0–100.0)
Platelets: 286 10*3/uL (ref 150–400)
RBC: 4.16 MIL/uL (ref 3.87–5.11)
RDW: 12.4 % (ref 11.5–15.5)
WBC: 8 10*3/uL (ref 4.0–10.5)
nRBC: 0 % (ref 0.0–0.2)

## 2023-02-16 LAB — MAGNESIUM: Magnesium: 1.7 mg/dL (ref 1.7–2.4)

## 2023-02-16 SURGERY — ECHOCARDIOGRAM, TRANSESOPHAGEAL
Anesthesia: Monitor Anesthesia Care

## 2023-02-16 MED ORDER — MAGNESIUM SULFATE 2 GM/50ML IV SOLN
2.0000 g | Freq: Once | INTRAVENOUS | Status: AC
  Administered 2023-02-16: 2 g via INTRAVENOUS
  Filled 2023-02-16: qty 50

## 2023-02-16 MED ORDER — POTASSIUM CHLORIDE 10 MEQ/100ML IV SOLN
10.0000 meq | INTRAVENOUS | Status: AC
  Administered 2023-02-16: 10 meq via INTRAVENOUS
  Filled 2023-02-16 (×2): qty 100

## 2023-02-16 MED ORDER — QUETIAPINE FUMARATE 50 MG PO TABS
50.0000 mg | ORAL_TABLET | Freq: Every day | ORAL | Status: DC
  Administered 2023-02-16: 50 mg via ORAL
  Filled 2023-02-16: qty 1

## 2023-02-16 MED ORDER — MORPHINE SULFATE (PF) 2 MG/ML IV SOLN
1.0000 mg | Freq: Once | INTRAVENOUS | Status: AC
  Administered 2023-02-16: 1 mg via INTRAVENOUS
  Filled 2023-02-16: qty 1

## 2023-02-16 MED ORDER — SODIUM CHLORIDE 0.9 % IV BOLUS
1000.0000 mL | Freq: Once | INTRAVENOUS | Status: AC
  Administered 2023-02-16: 1000 mL via INTRAVENOUS

## 2023-02-16 MED ORDER — POTASSIUM CHLORIDE CRYS ER 20 MEQ PO TBCR
40.0000 meq | EXTENDED_RELEASE_TABLET | Freq: Once | ORAL | Status: AC
  Administered 2023-02-16: 40 meq via ORAL
  Filled 2023-02-16: qty 2

## 2023-02-16 NOTE — Progress Notes (Addendum)
   Patient Name: Brenda Stephens Date of Encounter: 02/16/2023 [facility] Cardiologist: Rollene Rotunda, MD   Interval Summary  .    Continues to maintain normal sinus rhythm, converted back on 02/15/2023.  No current complaints however still is on 2 L via nasal cannula.  She denies any chest pain or shortness of breath.  Vital Signs .    Vitals:   02/15/23 2100 02/16/23 0000 02/16/23 0400 02/16/23 0626  BP: 138/64   136/67  Pulse: 66   81  Resp: 18   18  Temp: 97.6 F (36.4 C)   97.7 F (36.5 C)  TempSrc: Oral   Oral  SpO2: 98% 98% 97% 99%  Weight:    85.1 kg  Height:        Intake/Output Summary (Last 24 hours) at 02/16/2023 0817 Last data filed at 02/16/2023 0420 Gross per 24 hour  Intake --  Output 1150 ml  Net -1150 ml      02/16/2023    6:26 AM 02/15/2023    5:25 AM 02/14/2023    7:46 AM  Last 3 Weights  Weight (lbs) 187 lb 9.6 oz 202 lb 13.2 oz 203 lb 4.2 oz  Weight (kg) 85.095 kg 92 kg 92.2 kg      Telemetry/ECG    Normal sinus rhythm heart rate 70s- Personally Reviewed  Physical Exam .   GEN: No acute distress.   Neck: No JVD Cardiac: RRR, no murmurs, rubs, or gallops.  Respiratory: Clear to auscultation bilaterally. GI: Soft, nontender, non-distended  MS: No edema  Patient Profile    KYRIEL ZONG is a 82 y.o. female admitted for acute on chronic HFpEF, A-fib RVR.  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  Assessment & Plan .     Paroxysmal A-fib RVR Converted to normal sinus rhythm on 02/15/2023 on Cardizem drip.  Still maintaining normal sinus rhythm with heart rates in the 70s.  TEE/DCCV canceled. Continue Eliquis 5 mg twice daily and Toprol-XL 50 mg Noncompliant on Eliquis PTA There is discussion of possible catheter ablation in the future, per Dr. Graciela Husbands Had elevated TSH, not on meds. Per primary team.   Chronic HFpEF Was getting diuresed with IV  Lasix 40 mg twice daily.  Now has AKI and overall looks euvolemic.  Continue to hold diuretics.  Echocardiogram shows hyperdynamic function 7075%.  Normal RV function. Would benefit from SGLT2 inhibitor, consider once renal function improves. Continue Toprol-XL 50 mg  Hypertension Poorly controlled.  Amlodipine was increased from 5 mg to 7.5 mg recently.  Continue to titrate as necessary.  Continue beta-blocker as above  AKI on CKD Creatinine 1.07 on admission.  Currently 1.7.  Suspect prerenal.  Was getting IV diuretics and now has some electrolyte abnormalities.  Potassium 3.3.  Will supplement.  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    For questions or updates, please contact [facility] Please consult [email or web address] for contact info under        Signed, Abagail Kitchens, PA-C

## 2023-02-16 NOTE — Telephone Encounter (Signed)
Pt sch an appt through mychart for herself for 08/28: Appt notes - "Need med refills and review if they've been working. Historically have seen Dr. Mena Goes, but he's booked and need to be seen. Thank you [phone])"  Called pt back to let her know we needs records sent over from other Urologist office. Someone else answered, pt is being discharged from hospital today and will call me back.   No referral in chart.  Would you like me to cancel and r/s when we have records from other Dr Office?

## 2023-02-16 NOTE — Progress Notes (Signed)
Occupational Therapy Treatment Patient Details Name: Brenda Stephens MRN: [id number] DOB: 19-Aug-1940 Today's Date: 02/16/2023   History of present illness Pt is 82 year old presented to [facility] on  8/14 for new onset afib with rvr. PMH - DVT, PE, HTN, dementia, rt and lt total shoulder, breast CA   OT comments  Patient in supine upon entry, pleasant, and eager to participate. Patient able to get to EOB with CGA to complete and ambulated to sink with cane for self care tasks. Patient able to stand for grooming tasks and required seated rest break to complete bathing tasks seated at sink. Discussed education with socks aide for LB dressing and patient declined stating she has one at home and her daughter assist with donning socks. Discharge recommendations continue to be appropriate. Acute OT to continue to follow.       If plan is discharge home, recommend the following:  A little help with walking and/or transfers;Assistance with cooking/housework;Assist for transportation;Help with stairs or ramp for entrance;Supervision due to cognitive status;Direct supervision/assist for medications management;Direct supervision/assist for financial management;A little help with bathing/dressing/bathroom   Equipment Recommendations  None recommended by OT    Recommendations for Other Services      Precautions / Restrictions Precautions Precautions: Fall;Other (comment) Precaution Comments: watch HR Restrictions Weight Bearing Restrictions: No       Mobility Bed Mobility Overal bed mobility: Needs Assistance Bed Mobility: Supine to Sit     Supine to sit: Contact guard     General bed mobility comments: CGA for trunk and cues to scoot to EOB    Transfers Overall transfer level: Needs assistance Equipment used: Straight cane Transfers: Sit to/from Stand, Bed to chair/wheelchair/BSC Sit to Stand: Contact guard assist     Step pivot transfers: Contact guard assist     General transfer  comment: CGA and cues for safety     Balance Overall balance assessment: Needs assistance Sitting-balance support: No upper extremity supported, Feet supported Sitting balance-Leahy Scale: Fair     Standing balance support: Single extremity supported, Bilateral upper extremity supported, During functional activity Standing balance-Leahy Scale: Poor Standing balance comment: stands at sink for self care tasks with one extremity support                           ADL either performed or assessed with clinical judgement   ADL Overall ADL's : Needs assistance/impaired     Grooming: Wash/dry face;Wash/dry hands;Contact guard assist;Standing   Upper Body Bathing: Set up;Sitting   Lower Body Bathing: Moderate assistance;Sitting/lateral leans       Lower Body Dressing: Maximal assistance Lower Body Dressing Details (indicate cue type and reason): to donn socks, discussed sock aide, patient states she has one but daughter assist with socks Toilet Transfer: Contact guard assist;Cueing for sequencing;Cueing for safety Toilet Transfer Details (indicate cue type and reason): simulated with use of cane                Extremity/Trunk Assessment              Vision       Perception     Praxis      Cognition Arousal: Alert Behavior During Therapy: WFL for tasks assessed/performed Overall Cognitive Status: History of cognitive impairments - at baseline  General Comments: cues for safety        Exercises      Shoulder Instructions       General Comments HR increased to 100 while standing for self care    Pertinent Vitals/ Pain       Pain Assessment Pain Assessment: No/denies pain  Home Living                                          Prior Functioning/Environment              Frequency  Min 1X/week        Progress Toward Goals  OT Goals(current goals can now be found in  the care plan section)  Progress towards OT goals: Progressing toward goals  Acute Rehab OT Goals OT Goal Formulation: With patient Time For Goal Achievement: 02/28/23 Potential to Achieve Goals: Good ADL Goals Pt Will Perform Lower Body Bathing: with min assist;with adaptive equipment Pt Will Perform Upper Body Dressing: sitting;with supervision Pt Will Perform Lower Body Dressing: with mod assist;with adaptive equipment Pt Will Transfer to Toilet: with modified independence Pt Will Perform Toileting - Clothing Manipulation and hygiene: with supervision  Plan      Co-evaluation                 AM-PAC OT "6 Clicks" Daily Activity     Outcome Measure   Help from another person eating meals?: A Little Help from another person taking care of personal grooming?: A Little Help from another person toileting, which includes using toliet, bedpan, or urinal?: A Little Help from another person bathing (including washing, rinsing, drying)?: A Little Help from another person to put on and taking off regular upper body clothing?: A Little Help from another person to put on and taking off regular lower body clothing?: A Lot 6 Click Score: 17    End of Session Equipment Utilized During Treatment: Gait belt;Other (comment) (cane)  OT Visit Diagnosis: Unsteadiness on feet (R26.81);Muscle weakness (generalized) (M62.81)   Activity Tolerance Patient tolerated treatment well   Patient Left in chair;with call bell/phone within reach   Nurse Communication Mobility status        Time: 1610-9604 OT Time Calculation (min): 26 min  Charges: OT General Charges $OT Visit: 1 Visit OT Treatments $Self Care/Home Management : 23-37 mins  Alfonse Flavors, OTA Acute Rehabilitation Services  Office [phone]   Dewain Penning 02/16/2023, 11:43 AM

## 2023-02-16 NOTE — Plan of Care (Signed)

## 2023-02-16 NOTE — Progress Notes (Addendum)
Physical Therapy Treatment Patient Details Name: Brenda Stephens MRN: [id number] DOB: [birth year] Today's Date: 02/16/2023   History of Present Illness Pt is 82 year old presented to [facility] on  8/14 for new onset afib with rvr. PMH - DVT, PE, HTN, dementia, rt and lt total shoulder, breast CA    PT Comments  Pt admitted with above diagnosis. Pt was able to ambulate with contact guard asssist and use of RW. Daughter present and can assist pt at home.  Pt needs HHPT to work on endurance.   Pt currently with functional limitations due to the deficits listed below (see PT Problem List). Pt will benefit from acute skilled PT to increase their independence and safety with mobility to allow discharge.       If plan is discharge home, recommend the following: A little help with walking and/or transfers;A little help with bathing/dressing/bathroom;Help with stairs or ramp for entrance;Assistance with cooking/housework   Can travel by private vehicle        Equipment Recommendations  None recommended by PT    Recommendations for Other Services       Precautions / Restrictions Precautions Precautions: Fall;Other (comment) Precaution Comments: watch HR Restrictions Weight Bearing Restrictions: No     Mobility  Bed Mobility Overal bed mobility: Needs Assistance Bed Mobility: Supine to Sit     Supine to sit: Contact guard Sit to supine: Min assist   General bed mobility comments: CGA for trunk and cues to scoot to EOB    Transfers Overall transfer level: Needs assistance Equipment used: Rolling walker (2 wheels) Transfers: Sit to/from Stand, Bed to chair/wheelchair/BSC Sit to Stand: Contact guard assist           General transfer comment: CGA and cues for safety    Ambulation/Gait Ambulation/Gait assistance: Contact guard assist Gait Distance (Feet): 20 Feet (20 feet and then 5 feet) Assistive device: Rolling walker (2 wheels) Gait Pattern/deviations: Step-through pattern,  Decreased step length - right, Decreased step length - left Gait velocity: decr Gait velocity interpretation: <1.31 ft/sec, indicative of household ambulator   General Gait Details: Assist for safety. Distance limited by fatigue and shakiness on feet. Also pt had to urinate and once she stood to be cleaned she wanted to just walk back to bed.   Stairs             Wheelchair Mobility     Tilt Bed    Modified Rankin (Stroke Patients Only)       Balance Overall balance assessment: Needs assistance Sitting-balance support: No upper extremity supported, Feet supported Sitting balance-Leahy Scale: Fair     Standing balance support: Single extremity supported, Bilateral upper extremity supported, During functional activity Standing balance-Leahy Scale: Poor Standing balance comment: stands at sink for self care tasks with one extremity support                            Cognition Arousal: Alert Behavior During Therapy: WFL for tasks assessed/performed Overall Cognitive Status: History of cognitive impairments - at baseline                                 General Comments: cues for safety        Exercises General Exercises - Lower Extremity Ankle Circles/Pumps: AROM, Both, 10 reps, Supine Quad Sets: AROM, Both, 10 reps, Supine Gluteal Sets: AROM, Both, 10 reps, Supine  Long Arc Quad: AROM, Both, 5 reps, Seated Heel Slides: AROM, Both, 5 reps, Supine Hip ABduction/ADduction: AROM, Both, 5 reps, Supine Straight Leg Raises: AROM, Both, 5 reps, Supine Hip Flexion/Marching: AROM, Both, 5 reps, Seated    General Comments General comments (skin integrity, edema, etc.): VSS on RA      Pertinent Vitals/Pain Pain Assessment Pain Assessment: No/denies pain    Home Living                          Prior Function            PT Goals (current goals can now be found in the care plan section) Acute Rehab PT Goals Patient Stated Goal:  go home Progress towards PT goals: Progressing toward goals    Frequency    Min 1X/week      PT Plan      Co-evaluation              AM-PAC PT "6 Clicks" Mobility   Outcome Measure  Help needed turning from your back to your side while in a flat bed without using bedrails?: A Little Help needed moving from lying on your back to sitting on the side of a flat bed without using bedrails?: A Little Help needed moving to and from a bed to a chair (including a wheelchair)?: A Little Help needed standing up from a chair using your arms (e.g., wheelchair or bedside chair)?: A Little Help needed to walk in hospital room?: A Little Help needed climbing 3-5 steps with a railing? : Total 6 Click Score: 16    End of Session Equipment Utilized During Treatment: Gait belt Activity Tolerance: Patient limited by fatigue Patient left: in bed;with call bell/phone within reach;with family/visitor present Nurse Communication: Mobility status PT Visit Diagnosis: Other abnormalities of gait and mobility (R26.89);Muscle weakness (generalized) (M62.81)     Time: 1610-9604 PT Time Calculation (min) (ACUTE ONLY): 38 min  Charges:    $Gait Training: 23-37 mins $Self Care/Home Management: 8-22 PT General Charges $$ ACUTE PT VISIT: 1 Visit                     Deborah Dondero M,PT Acute Rehab Services [phone]    Brenda Stephens 02/16/2023, 4:17 PM

## 2023-02-16 NOTE — Progress Notes (Addendum)
Progress Note   Patient: Brenda Stephens UJW:[id number] DOB: [birth year] DOA: 02/11/2023     4 DOS: the patient was seen and examined on 02/16/2023   Brief hospital course:  Patient is an 82 year old female with past medical history significant for pulmonary embolism, DVT on Eliquis but noncompliant, hypertension, hypothyroidism, hyperlipidemia and history of dementia.  Patient was noted to be in A-fib with RVR at the cardiology office and was asked to come to the hospital for further evaluation and management.  There was also associated chest heaviness and leg swelling.  Echocardiogram revealed normal ejection fraction, suspected acute on chronic diastolic congestive heart failure.  Cardiology team has been consulted.  Cardiology team is directing care.  Patient is already on anticoagulation.  Heart rate is controlled.   02/12/2023: Patient seen alongside patient's daughter.  Need to comply with DOAC was discussed with the patient and patient's daughter.  No chest pain.  Volume overload seems to be improving.  Cardiology input is appreciated.   02/13/2023: Patient seen alongside patient's daughter.  Patient reports musculoskeletal chest pain.  Patient is still on Cardizem drip.  Heart rate is controlled.  Cardiology is directing care.   8/17: DC cardizem drip, on oral metoprolol succinate 50 mg  8/18 Converted back to sinus rhythm spontaneously. With AKI,stopped lasix and given some fluid back 8/19 : AKI persists.  TEE canceled since patient converted to sinus rhythm spontaneously.      Assessment and Plan:  Rapid atrial fibrillation (HCC) LVEF normal, symptomatic with afib.  Spontaneously cardioverted 8/17-8/18.  on metoprolol succinate 50 mg  -Eliquis as primary prophylaxis for an acute stroke TEE cardioversion was planned but canceled since patient is now in sinus rhythm   AKI Leg swelling/acute on chronic diastolic CHF Cr increased 8/18 suspected due to pre-renal AKI after diuresis.  NN 2 L with 4 x unmeasured outputs. Given some fluid back 8/18. TTE on 8/15 with collapsible IVC, hyperdynamic LV.  Serum creatinine 1.7, continue to hold Lasix -Strict I's and O's. -Monitor renal function and electrolytes   History of DVT of lower extremity Continue with Eliquis 5 mg bid   History of pulmonary embolism Continue with Eliquis 5 mg bid   Dementia without behavioral disturbance (HCC) Continue with aricept and namenda.   Essential hypertension Poorly controlled.  Amlodipine 5 mg increased to 7.5 mg once daily, metoprolol as above.    Hyperlipidemia Stable.   Hypothyroidism TSH is 7.963 Repeat TSH and free T4 in 4 to 6 weeks (when patient is out of the hospital).   MDD -restarted venlafaxine 150 mg once daily   Hypokalemia/hypomagnesemia Supplement potassium and magnesium     Subjective: Patient is seen and examined at the bedside.  No new complaints  Physical Exam: Vitals:   02/16/23 0400 02/16/23 0626 02/16/23 0757 02/16/23 1635  BP:  136/67 (!) 146/119   Pulse:  81 93   Resp:  18 17 16   Temp:  97.7 F (36.5 C)  97.8 F (36.6 C)  TempSrc:  Oral  Oral  SpO2: 97% 99% 93%   Weight:  85.1 kg    Height:       General exam: Appears calm and comfortable.  Patient is obese. Respiratory system: Clear to auscultation.  Cardiovascular system: S1 & S2, irregularly irregular. Gastrointestinal system: Abdomen is obese, soft and nontender.   Central nervous system: Awake and alert.  Moves all extremities.   Extremities: Mild edema of lower extremities.  Data Reviewed: Labs reviewed.  Potassium 3.3,  sodium 130, creatinine 1.7, magnesium 1.7 There are no new results to review at this time.  Family Communication: None  Disposition: Status is: Inpatient Remains inpatient appropriate because: Rate control  Planned Discharge Destination: Skilled nursing facility    Time spent: 33 minutes  Author: Lucile Shutters, MD 02/16/2023 4:49 PM  For on call  review [email or web address].

## 2023-02-16 NOTE — Progress Notes (Signed)
Patient is complaining about bilateral knee pain from osteoarthritis which was improving with Tylenol as needed and Voltaren gel.  She is also very anxious even though she is on Atarax as needed.  Patient is requesting something for pain control as well as for sleep. - Giving 1 dose of morphine 1 mg for tonight.  Tereasa Coop, MD [facility] 02/16/2023, 12:10 AM

## 2023-02-17 DIAGNOSIS — I10 Essential (primary) hypertension: Secondary | ICD-10-CM | POA: Diagnosis not present

## 2023-02-17 DIAGNOSIS — N179 Acute kidney failure, unspecified: Secondary | ICD-10-CM | POA: Diagnosis not present

## 2023-02-17 DIAGNOSIS — M7989 Other specified soft tissue disorders: Secondary | ICD-10-CM | POA: Diagnosis not present

## 2023-02-17 DIAGNOSIS — I4891 Unspecified atrial fibrillation: Secondary | ICD-10-CM | POA: Diagnosis not present

## 2023-02-17 DIAGNOSIS — Z86711 Personal history of pulmonary embolism: Secondary | ICD-10-CM

## 2023-02-17 DIAGNOSIS — F0390 Unspecified dementia without behavioral disturbance: Secondary | ICD-10-CM | POA: Diagnosis not present

## 2023-02-17 LAB — CBC
HCT: 35.4 % — ABNORMAL LOW (ref 36.0–46.0)
Hemoglobin: 11.7 g/dL — ABNORMAL LOW (ref 12.0–15.0)
MCH: 29.3 pg (ref 26.0–34.0)
MCHC: 33.1 g/dL (ref 30.0–36.0)
MCV: 88.5 fL (ref 80.0–100.0)
Platelets: 247 10*3/uL (ref 150–400)
RBC: 4 MIL/uL (ref 3.87–5.11)
RDW: 12.4 % (ref 11.5–15.5)
WBC: 6.7 10*3/uL (ref 4.0–10.5)
nRBC: 0 % (ref 0.0–0.2)

## 2023-02-17 LAB — MAGNESIUM: Magnesium: 2.4 mg/dL (ref 1.7–2.4)

## 2023-02-17 LAB — BASIC METABOLIC PANEL
Anion gap: 12 (ref 5–15)
BUN: 23 mg/dL (ref 8–23)
CO2: 23 mmol/L (ref 22–32)
Calcium: 8.9 mg/dL (ref 8.9–10.3)
Chloride: 97 mmol/L — ABNORMAL LOW (ref 98–111)
Creatinine, Ser: 1.01 mg/dL — ABNORMAL HIGH (ref 0.44–1.00)
GFR, Estimated: 56 mL/min — ABNORMAL LOW (ref >60)
Glucose, Bld: 116 mg/dL — ABNORMAL HIGH (ref 70–99)
Potassium: 3.4 mmol/L — ABNORMAL LOW (ref 3.5–5.1)
Sodium: 132 mmol/L — ABNORMAL LOW (ref 135–145)

## 2023-02-17 MED ORDER — POTASSIUM CHLORIDE CRYS ER 20 MEQ PO TBCR: 20.0000 meq | EXTENDED_RELEASE_TABLET | Freq: Every day | ORAL | 0 refills | Status: DC

## 2023-02-17 MED ORDER — AMLODIPINE BESYLATE 10 MG PO TABS: 10.0000 mg | ORAL_TABLET | Freq: Every day | ORAL | 2 refills | Status: DC

## 2023-02-17 MED ORDER — POTASSIUM CHLORIDE CRYS ER 20 MEQ PO TBCR
20.0000 meq | EXTENDED_RELEASE_TABLET | Freq: Two times a day (BID) | ORAL | Status: DC
  Administered 2023-02-17: 20 meq via ORAL
  Filled 2023-02-17: qty 1

## 2023-02-17 MED ORDER — METOPROLOL SUCCINATE ER 50 MG PO TB24: 50.0000 mg | ORAL_TABLET | Freq: Every day | ORAL | 2 refills | Status: DC

## 2023-02-17 NOTE — Discharge Summary (Addendum)
Physician Discharge Summary  Brenda Stephens JKK:[id number] DOB: [birth year] DOA: 02/11/2023  PCP: Verlee Rossetti, PA-C  Admit date: 02/11/2023 Discharge date: 02/17/2023  Admitted From: Home  Discharge disposition: Home   Recommendations for Outpatient Follow-Up:   Follow up with your primary care provider in one week.  Check CBC, BMP, magnesium in the next visit Follow-up with cardiology as scheduled by the clinic. Repeat TSH and free T4 in 4 to 6 weeks (when patient is out of the hospital).   Discharge Diagnosis:   Principal Problem:   Rapid atrial fibrillation (HCC) Active Problems:   Leg swelling   Hypothyroidism   Hyperlipidemia   Essential hypertension   Dementia without behavioral disturbance (HCC)   History of pulmonary embolism   History of DVT of lower extremity   Atrial fibrillation with RVR (HCC)   AKI (acute kidney injury) (HCC)   Discharge Condition: Improved.  Diet recommendation: Low sodium, heart healthy.    Wound care: None.  Code status: Full.   History of Present Illness:   Patient is an 82 year old female with past medical history significant for  pulmonary embolism, DVT on Eliquis but noncompliant, hypertension, hypothyroidism, hyperlipidemia and history of dementia was noted to have A-fib with RVR at the cardiology office.  She had also complained of chest heaviness and leg swelling.  Patient was then admitted hospital for further evaluation and treatment.  Hospital Course:   Following conditions were addressed during hospitalization as listed below,  Paroxysmal atrial fibrillation with RVR  2D echocardiogram showed normal LV ejection fraction.  Continue metoprolol, Eliquis on discharge.  Metoprolol was increased from home dose on discharge..   Has converted to normal sinus rhythm on 8/18/20224, patient was initially on Cardizem drip.  TEE cardioversion was canceled due to spontaneous conversion.  Patient will need to follow-up with  cardiology as outpatient.  AKI on CKD IIIa Leg swelling/acute on chronic diastolic CHF Received diuretics with rising creatinine.  Now improved.  Lasix will be on hold and discharge.  Amlodipine has been added including increased dose of beta-blocker.  Hold off with ARB.  History of DVT and pulmonary embolism Continue with Eliquis 5 mg bid.  Spoke with the patient's daughter about need for anticoagulation.  Dementia without behavioral disturbance (HCC) Continue with aricept and namenda.   Essential hypertension Continue metoprolol.  Amlodipine dose increased to 100 mg on discharge.   Hyperlipidemia Stable.   Hypothyroidism TSH is 7.963. Repeat TSH and free T4 in 4 to 6 weeks (when patient is out of the hospital).   MDD Continue venlafaxine on discharge.    Mild hypokalemia today.  Was replenished orally.  Will continue oral potassium supplement on discharge.   Disposition.  At this time, patient is stable for disposition home with outpatient PCP and cardiology follow-up.  Spoke with the patient's daughter on the phone prior to disposition.  Medical Consultants:   Cardiology  Procedures:    None Subjective:   Today, patient was seen and examined at bedside.  Denies any shortness of breath, chest pain, dizziness, lightheadedness fever or chills.  Seen by cardiology and okay for discharge.  Discharge Exam:   Vitals:   02/17/23 0812 02/17/23 0937  BP: (!) 148/55 139/69  Pulse: 73 86  Resp: 18   Temp:  97.8 F (36.6 C)  SpO2:     Vitals:   02/17/23 0400 02/17/23 0553 02/17/23 0812 02/17/23 0937  BP:  (!) 149/68 (!) 148/55 139/69  Pulse:  72 73 86  Resp:  18 18   Temp:  (!) 97.4 F (36.3 C)  97.8 F (36.6 C)  TempSrc:  Oral  Oral  SpO2: 98% 98%    Weight:  90.2 kg    Height:      Body mass index is 35.23 kg/m.   General: Alert awake, not in obvious distress, obese built, elderly female, Communicative, hard of hearing HENT: pupils equally reacting to light,   No scleral pallor or icterus noted. Oral mucosa is moist.  Chest:   Diminished breath sounds bilaterally. No crackles or wheezes.  CVS: S1 &S2 heard. No murmur.  Regular rate and rhythm. Abdomen: Soft, nontender, nondistended.  Bowel sounds are heard.   Extremities: No cyanosis, clubbing or edema.  Peripheral pulses are palpable. Psych: Alert, awake and oriented, normal mood CNS:  No cranial nerve deficits.  Moves all extremities. Skin: Warm and dry.  No rashes noted.  The results of significant diagnostics from this hospitalization (including imaging, microbiology, ancillary and laboratory) are listed below for reference.     Diagnostic Studies:   ECHOCARDIOGRAM COMPLETE  Result Date: 02/12/2023    ECHOCARDIOGRAM REPORT   Patient Name:   Brenda Stephens Date of Exam: 02/12/2023 Medical Rec #:  [id number]       Height:       63.0 in Accession #:    [id number]      Weight:       189.6 lb Date of Birth:  [birth year]       BSA:          1.890 m Patient Age:    82 years        BP:           135/74 mmHg Patient Gender: F               HR:           97 bpm. Exam Location:  Inpatient Procedure: 2D Echo, Color Doppler, Cardiac Doppler and Intracardiac            Opacification Agent Indications:    Atrial Flutter  History:        Patient has prior history of Echocardiogram examinations, most                 recent 06/30/2022. Arrythmias:Atrial Flutter,                 Signs/Symptoms:Dyspnea and Fatigue; Risk Factors:Former Smoker                 and Hypothyroidism. Acute pulmonary Embolism.  Sonographer:    Raeford Razor RDCS Referring Phys: [phone] ERIC CHEN  Sonographer Comments: Technically difficult study due to poor echo windows and no subcostal window. Image acquisition challenging due to patient body habitus. IMPRESSIONS  1. Left ventricular ejection fraction, by estimation, is 70 to 75%. The left ventricle has hyperdynamic function. The left ventricle has no regional wall motion abnormalities. Left ventricular  diastolic parameters are indeterminate.  2. Right ventricular systolic function is normal. The right ventricular size is normal.  3. Left atrial size was mildly dilated.  4. The mitral valve is normal in structure. No evidence of mitral valve regurgitation. No evidence of mitral stenosis.  5. The aortic valve is tricuspid. There is mild calcification of the aortic valve. Aortic valve regurgitation is not visualized. No aortic stenosis is present.  6. The inferior vena cava is normal in size with greater than 50% respiratory variability, suggesting right atrial pressure of  3 mmHg. Conclusion(s)/Recommendation(s): RV not well seen but appears grossly normal. FINDINGS  Left Ventricle: Left ventricular ejection fraction, by estimation, is 70 to 75%. The left ventricle has hyperdynamic function. The left ventricle has no regional wall motion abnormalities. Definity contrast agent was given IV to delineate the left ventricular endocardial borders. The left ventricular internal cavity size was normal in size. There is no left ventricular hypertrophy. Left ventricular diastolic parameters are indeterminate. Right Ventricle: The right ventricular size is normal. No increase in right ventricular wall thickness. Right ventricular systolic function is normal. Left Atrium: Left atrial size was mildly dilated. Right Atrium: Right atrial size was normal in size. Pericardium: There is no evidence of pericardial effusion. Mitral Valve: The mitral valve is normal in structure. No evidence of mitral valve regurgitation. No evidence of mitral valve stenosis. Tricuspid Valve: The tricuspid valve is normal in structure. Tricuspid valve regurgitation is trivial. No evidence of tricuspid stenosis. Aortic Valve: The aortic valve is tricuspid. There is mild calcification of the aortic valve. Aortic valve regurgitation is not visualized. No aortic stenosis is present. Aortic valve peak gradient measures 7.5 mmHg. Pulmonic Valve: The pulmonic  valve was normal in structure. Pulmonic valve regurgitation is trivial. No evidence of pulmonic stenosis. Aorta: The aortic root is normal in size and structure. Venous: The inferior vena cava was not well visualized. The inferior vena cava is normal in size with greater than 50% respiratory variability, suggesting right atrial pressure of 3 mmHg. IAS/Shunts: No atrial level shunt detected by color flow Doppler.  LEFT VENTRICLE PLAX 2D LVIDd:         4.30 cm LVIDs:         2.70 cm LV PW:         1.10 cm LV IVS:        1.10 cm LVOT diam:     2.00 cm LV SV:         55 LV SV Index:   29 LVOT Area:     3.14 cm  LV Volumes (MOD) LV vol d, MOD A2C: 53.2 ml LV vol d, MOD A4C: 64.3 ml LV vol s, MOD A2C: 18.6 ml LV vol s, MOD A4C: 22.5 ml LV SV MOD A2C:     34.6 ml LV SV MOD A4C:     64.3 ml LV SV MOD BP:      41.2 ml LEFT ATRIUM           Index LA diam:      4.30 cm 2.27 cm/m LA Vol (A2C): 37.8 ml 20.00 ml/m  AORTIC VALVE AV Area (Vmax): 3.23 cm AV Vmax:        137.00 cm/s AV Peak Grad:   7.5 mmHg LVOT Vmax:      141.00 cm/s LVOT Vmean:     92.100 cm/s LVOT VTI:       0.174 m  AORTA Ao Root diam: 2.80 cm Ao Asc diam:  3.50 cm MITRAL VALVE MV Area (PHT): 4.11 cm    SHUNTS MV Decel Time: 185 msec    Systemic VTI:  0.17 m MV E velocity: 81.73 cm/s  Systemic Diam: 2.00 cm Arvilla Meres MD Electronically signed by Arvilla Meres MD Signature Date/Time: 02/12/2023/2:31:45 PM    Final    VAS [place] LOWER EXTREMITY VENOUS (DVT)  Result Date: 02/12/2023  Lower Venous DVT Study Patient Name:  NEIDA VANNESS  Date of Exam:   02/12/2023 Medical Rec #: [id number]  Accession #:    [id number] Date of Birth: [birth year]        Patient Gender: F Patient Age:   47 years Exam Location:  [facility] Procedure:      VAS [place] LOWER EXTREMITY VENOUS (DVT) Referring Phys: ERIC CHEN --------------------------------------------------------------------------------  Indications: Hx of DVT in right popliteal vein (06/30/22), Hx of PE,  LLE pain and swelling.  Anticoagulation: Eliquis. Limitations: Body habitus, poor ultrasound/tissue interface and Patient unable to tolerate compressions. Comparison Study: Previous study 06/30/22 positive in right popliteal vein. Performing Technologist: McKayla Maag RVT, [place]  Examination Guidelines: A complete evaluation includes B-mode imaging, spectral Doppler, color Doppler, and power Doppler as needed of all accessible portions of each vessel. Bilateral testing is considered an integral part of a complete examination. Limited examinations for reoccurring indications may be performed as noted. The reflux portion of the exam is performed with the patient in reverse Trendelenburg.  +---------+---------------+---------+-----------+----------+--------------+ RIGHT    CompressibilityPhasicitySpontaneityPropertiesThrombus Aging +---------+---------------+---------+-----------+----------+--------------+ CFV      Full           Yes      Yes                                 +---------+---------------+---------+-----------+----------+--------------+ SFJ      Full                                                        +---------+---------------+---------+-----------+----------+--------------+ FV Prox  Full                                                        +---------+---------------+---------+-----------+----------+--------------+ FV Mid   Full                                                        +---------+---------------+---------+-----------+----------+--------------+ FV DistalFull                                                        +---------+---------------+---------+-----------+----------+--------------+ PFV      Full                                                        +---------+---------------+---------+-----------+----------+--------------+ POP      Full           Yes      Yes                                  +---------+---------------+---------+-----------+----------+--------------+ PTV      Full                                                        +---------+---------------+---------+-----------+----------+--------------+  PERO                                                  Not visualized +---------+---------------+---------+-----------+----------+--------------+   +--------+---------------+---------+-----------+----------+--------------------+ LEFT    CompressibilityPhasicitySpontaneityPropertiesThrombus Aging       +--------+---------------+---------+-----------+----------+--------------------+ CFV                    Yes      Yes                  Patent by color                                                           doppler              +--------+---------------+---------+-----------+----------+--------------------+ SFJ                    Yes      Yes                  Patent by color                                                           doppler              +--------+---------------+---------+-----------+----------+--------------------+ FV Prox Full                                                              +--------+---------------+---------+-----------+----------+--------------------+ FV Mid                 Yes      Yes                  Patent by color                                                           doppler              +--------+---------------+---------+-----------+----------+--------------------+ FV                                                   Not visualized       Distal                                                                    +--------+---------------+---------+-----------+----------+--------------------+  PFV     Full                                                              +--------+---------------+---------+-----------+----------+--------------------+ POP     Full            Yes      Yes                                       +--------+---------------+---------+-----------+----------+--------------------+ PTV     Full                                                              +--------+---------------+---------+-----------+----------+--------------------+ PERO                                                 Not visualized       +--------+---------------+---------+-----------+----------+--------------------+     Summary: RIGHT: - There is no evidence of deep vein thrombosis in the lower extremity. However, portions of this examination were limited- see technologist comments above.  - No cystic structure found in the popliteal fossa.  LEFT: - There is no evidence of deep vein thrombosis in the lower extremity. However, portions of this examination were limited- see technologist comments above.  - No cystic structure found in the popliteal fossa.  *See table(s) above for measurements and observations. Electronically signed by Sherald Hess MD on 02/12/2023 at 2:07:23 PM.    Final    DG Chest Portable 1 View  Result Date: 02/11/2023 CLINICAL DATA:  Palpitations EXAM: PORTABLE CHEST 1 VIEW COMPARISON:  07/15/2022 FINDINGS: Bilateral shoulder replacements. No acute airspace disease or effusion. Stable cardiomediastinal silhouette with aortic atherosclerosis. IMPRESSION: No active disease. Electronically Signed   By: Jasmine Pang M.D.   On: 02/11/2023 18:58     Labs:   Basic Metabolic Panel: Recent Labs  Lab 02/13/23 0208 02/14/23 0253 02/14/23 1346 02/15/23 0036 02/16/23 0151 02/17/23 0001  NA 132* 134* 136 136 130* 132*  K 3.4* 3.7 4.2 3.8 3.3* 3.4*  CL 97* 98 99 98 92* 97*  CO2 19* 21* 26 24 25 23   GLUCOSE 133* 137* 139* 133* 129* 116*  BUN 14 17 16 23  29* 23  CREATININE 1.24* 1.21* 1.32* 1.58* 1.71* 1.01*  CALCIUM 9.2 9.3 9.5 9.4 8.9 8.9  MG 1.8 2.1  --  1.8 1.7 2.4  PHOS 3.6  --   --   --   --   --    GFR Estimated Creatinine  Clearance: 46.6 mL/min (A) (by C-G formula based on SCr of 1.01 mg/dL (H)). Liver Function Tests: Recent Labs  Lab 02/12/23 0821 02/13/23 0208  AST 28  --   ALT 26  --   ALKPHOS 51  --   BILITOT 0.8  --   PROT 6.9  --   ALBUMIN 3.6 3.5   No  results for input(s): "LIPASE", "AMYLASE" in the last 168 hours. No results for input(s): "AMMONIA" in the last 168 hours. Coagulation profile No results for input(s): "INR", "PROTIME" in the last 168 hours.  CBC: Recent Labs  Lab 02/13/23 0208 02/14/23 1346 02/15/23 0036 02/16/23 0151 02/17/23 0001  WBC 10.6* 9.2 9.7 8.0 6.7  NEUTROABS 8.2*  --   --   --   --   HGB 13.5 13.2 13.7 11.9* 11.7*  HCT 40.3 40.8 41.9 36.1 35.4*  MCV 88.2 89.3 90.9 86.8 88.5  PLT 316 319 289 286 247   Cardiac Enzymes: No results for input(s): "CKTOTAL", "CKMB", "CKMBINDEX", "TROPONINI" in the last 168 hours. BNP: Invalid input(s): "POCBNP" CBG: No results for input(s): "GLUCAP" in the last 168 hours. D-Dimer No results for input(s): "DDIMER" in the last 72 hours. Hgb A1c No results for input(s): "HGBA1C" in the last 72 hours. Lipid Profile No results for input(s): "CHOL", "HDL", "LDLCALC", "TRIG", "CHOLHDL", "LDLDIRECT" in the last 72 hours. Thyroid function studies No results for input(s): "TSH", "T4TOTAL", "T3FREE", "THYROIDAB" in the last 72 hours.  Invalid input(s): "FREET3" Anemia work up No results for input(s): "VITAMINB12", "FOLATE", "FERRITIN", "TIBC", "IRON", "RETICCTPCT" in the last 72 hours. Microbiology No results found for this or any previous visit (from the past 240 hour(s)).   Discharge Instructions:   Discharge Instructions     Diet - low sodium heart healthy   Complete by: As directed    Discharge instructions   Complete by: As directed    Follow-up with your primary care provider in 1 week.  Check blood work at that time.  Take blood thinners and all medications as prescribed.  Seek medical attention for worsening  symptoms.  Follow-up with cardiology as scheduled by the clinic.  Please note change in medication doses.   Increase activity slowly   Complete by: As directed       Allergies as of 02/17/2023       Reactions   Latex Rash   Sulfa Antibiotics Anaphylaxis, Nausea Only   Aspirin Other (See Comments)   GI bleeding   Nsaids Other (See Comments)   GI upset    Oxycodone-acetaminophen Nausea Only        Medication List     STOP taking these medications    furosemide 20 MG tablet Commonly known as: LASIX   nitroGLYCERIN 0.4 MG SL tablet Commonly known as: NITROSTAT   valsartan 80 MG tablet Commonly known as: DIOVAN       TAKE these medications    acetaminophen 650 MG CR tablet Commonly known as: TYLENOL Take 1 tablet (650 mg total) by mouth every 8 (eight) hours as needed for pain (headaches). What changed:  how much to take reasons to take this   albuterol 108 (90 Base) MCG/ACT inhaler Commonly known as: VENTOLIN HFA Inhale 2 puffs into the lungs every 6 (six) hours as needed for wheezing or shortness of breath.   amLODipine 10 MG tablet Commonly known as: NORVASC Take 1 tablet (10 mg total) by mouth daily. Start taking on: February 18, 2023   apixaban 5 MG Tabs tablet Commonly known as: ELIQUIS Take 1 tablet (5 mg total) by mouth 2 (two) times daily. What changed: Another medication with the same name was removed. Continue taking this medication, and follow the directions you see here.   benzonatate 200 MG capsule Commonly known as: TESSALON TAKE 1 CAPSULE (200 MG TOTAL) BY MOUTH 3 (THREE) TIMES DAILY AS NEEDED FOR COUGH.  Biotin 69629 MCG Tabs Take 10,000 mcg by mouth daily.   cetirizine 10 MG tablet Commonly known as: ZYRTEC Take 10 mg by mouth at bedtime.   donepezil 10 MG tablet Commonly known as: ARICEPT Take 10 mg by mouth at bedtime.   hydrOXYzine 25 MG tablet Commonly known as: ATARAX Take 1 tablet (25 mg total) by mouth every 6 (six) hours  as needed for anxiety.   levothyroxine 25 MCG tablet Commonly known as: SYNTHROID Take 12.5 mcg by mouth daily before breakfast.   meclizine 25 MG tablet Commonly known as: ANTIVERT Take 1 tablet (25 mg total) by mouth 3 (three) times daily as needed for dizziness. What changed: reasons to take this   memantine 10 MG tablet Commonly known as: NAMENDA Take 10 mg by mouth 2 (two) times daily.   metoprolol succinate 50 MG 24 hr tablet Commonly known as: Toprol XL Take 1 tablet (50 mg total) by mouth at bedtime. What changed:  medication strength how much to take   NexIUM 24HR 20 MG capsule Generic drug: esomeprazole Take 40 mg by mouth daily before breakfast. What changed: Another medication with the same name was removed. Continue taking this medication, and follow the directions you see here.   oxybutynin 5 MG tablet Commonly known as: DITROPAN Take 5 mg by mouth 2 (two) times daily.   PATADAY OP Place 1 drop into both eyes daily as needed (for allergies).   potassium chloride SA 20 MEQ tablet Commonly known as: KLOR-CON M Take 1 tablet (20 mEq total) by mouth daily.   QUEtiapine 50 MG tablet Commonly known as: SEROQUEL Take 50 mg by mouth at bedtime.   Restasis 0.05 % ophthalmic emulsion Generic drug: cycloSPORINE Place 1 drop into both eyes 2 (two) times daily.   sennosides-docusate sodium 8.6-50 MG tablet Commonly known as: SENOKOT-S Take 1 tablet by mouth daily as needed for constipation.   SYSTANE OP Place 1 drop into both eyes 2 (two) times daily as needed (for dryness).   tamsulosin 0.4 MG Caps capsule Commonly known as: FLOMAX Take 0.4 mg by mouth at bedtime.   venlafaxine XR 150 MG 24 hr capsule Commonly known as: EFFEXOR-XR TAKE 1 CAPSULE (150 MG TOTAL) BY MOUTH DAILY.        Follow-up Information     Verlee Rossetti, PA-C Follow up in 1 week(s).   Specialty: Physician Assistant Contact information: [address] [place]  [address] [phone]                  Time coordinating discharge: 39 minutes  Signed:  Camron Essman  [facility] 02/17/2023, 5:35 PM

## 2023-02-17 NOTE — Care Management Important Message (Signed)
Important Message  Patient Details  Name: Brenda Stephens MRN: [id number] Date of Birth: 1940/10/11   Medicare Important Message Given:  Yes     Renie Ora 02/17/2023, 11:23 AM

## 2023-02-17 NOTE — Plan of Care (Signed)
?  Problem: Education: ?Goal: Knowledge of disease or condition will improve ?Outcome: Adequate for Discharge ?Goal: Understanding of medication regimen will improve ?Outcome: Adequate for Discharge ?Goal: Individualized Educational Video(s) ?Outcome: Adequate for Discharge ?  ?Problem: Activity: ?Goal: Ability to tolerate increased activity will improve ?Outcome: Adequate for Discharge ?  ?Problem: Cardiac: ?Goal: Ability to achieve and maintain adequate cardiopulmonary perfusion will improve ?Outcome: Adequate for Discharge ?  ?Problem: Health Behavior/Discharge Planning: ?Goal: Ability to safely manage health-related needs after discharge will improve ?Outcome: Adequate for Discharge ?  ?Problem: Education: ?Goal: Knowledge of General Education information will improve ?Description: Including pain rating scale, medication(s)/side effects and non-pharmacologic comfort measures ?Outcome: Adequate for Discharge ?  ?Problem: Health Behavior/Discharge Planning: ?Goal: Ability to manage health-related needs will improve ?Outcome: Adequate for Discharge ?  ?Problem: Clinical Measurements: ?Goal: Ability to maintain clinical measurements within normal limits will improve ?Outcome: Adequate for Discharge ?Goal: Will remain free from infection ?Outcome: Adequate for Discharge ?Goal: Diagnostic test results will improve ?Outcome: Adequate for Discharge ?Goal: Respiratory complications will improve ?Outcome: Adequate for Discharge ?Goal: Cardiovascular complication will be avoided ?Outcome: Adequate for Discharge ?  ?Problem: Activity: ?Goal: Risk for activity intolerance will decrease ?Outcome: Adequate for Discharge ?  ?Problem: Nutrition: ?Goal: Adequate nutrition will be maintained ?Outcome: Adequate for Discharge ?  ?Problem: Coping: ?Goal: Level of anxiety will decrease ?Outcome: Adequate for Discharge ?  ?Problem: Elimination: ?Goal: Will not experience complications related to bowel motility ?Outcome: Adequate for  Discharge ?Goal: Will not experience complications related to urinary retention ?Outcome: Adequate for Discharge ?  ?Problem: Pain Managment: ?Goal: General experience of comfort will improve ?Outcome: Adequate for Discharge ?  ?Problem: Safety: ?Goal: Ability to remain free from injury will improve ?Outcome: Adequate for Discharge ?  ?Problem: Skin Integrity: ?Goal: Risk for impaired skin integrity will decrease ?Outcome: Adequate for Discharge ?  ?

## 2023-02-17 NOTE — TOC Transition Note (Addendum)
Transition of Care [facility]) - CM/SW Discharge Note   Patient Details  Name: Brenda Stephens MRN: [id number] Date of Birth: 1940/07/20  Transition of Care [facility]) CM/SW Contact:  Leone Haven, RN Phone Number: 02/17/2023, 2:33 PM   Clinical Narrative:    Patient is for dc today, NCM asked MD for HHPT,HHOT orders. Patient has no other needs. Orders in.NCM notified [facility].         Patient Goals and CMS Choice      Discharge Placement                         Discharge Plan and Services Additional resources added to the After Visit Summary for                                       Social Determinants of Health (SDOH) Interventions SDOH Screenings   Food Insecurity: No Food Insecurity (02/12/2023)  Housing: Patient Declined (02/12/2023)  Transportation Needs: No Transportation Needs (02/12/2023)  Utilities: Not At Risk (02/12/2023)  Financial Resource Strain: Low Risk  (03/12/2021)   [facility] prior to 08/30/2022., [facility] visits prior to 08/30/2022.  Physical Activity: Inactive (03/12/2021)   Received from [facility] visits prior to 08/30/2022., [facility] visits prior to 08/30/2022.  Social Connections: Moderately Isolated (03/12/2021)   Received from [facility] visits prior to 08/30/2022., [facility] visits prior to 08/30/2022.  Stress: Stress Concern Present (03/12/2021)   [facility] visits prior to 08/30/2022., [facility] visits prior to 08/30/2022.  Tobacco Use: Medium Risk (02/12/2023)     Readmission Risk Interventions     No data to display

## 2023-02-17 NOTE — Progress Notes (Signed)
   Patient Name: Brenda Stephens Date of Encounter: 02/17/2023 [facility] Cardiologist: Rollene Rotunda, MD   Interval Summary  .    Continues to maintain normal sinus rhythm, converted back on 02/15/2023.  No current complaints, Off o2 She denies any chest pain or shortness of breath.  Vital Signs .    Vitals:   02/16/23 2000 02/17/23 0000 02/17/23 0400 02/17/23 0553  BP:    (!) 149/68  Pulse:    72  Resp:    18  Temp:    (!) 97.4 F (36.3 C)  TempSrc:    Oral  SpO2: 98% 98% 98% 98%  Weight:    90.2 kg  Height:        Intake/Output Summary (Last 24 hours) at 02/17/2023 0807 Last data filed at 02/16/2023 1636 Gross per 24 hour  Intake 353.8 ml  Output 700 ml  Net -346.2 ml      02/17/2023    5:53 AM 02/16/2023    6:26 AM 02/15/2023    5:25 AM  Last 3 Weights  Weight (lbs) 198 lb 13.7 oz 187 lb 9.6 oz 202 lb 13.2 oz  Weight (kg) 90.2 kg 85.095 kg 92 kg      Telemetry/ECG    Normal sinus rhythm heart rate 80s- Personally Reviewed  Physical Exam .   GEN: No acute distress.   Neck: No JVD Cardiac: RRR, no murmurs, rubs, or gallops.  Respiratory: Clear to auscultation bilaterally. GI: Soft, nontender, non-distended  MS: Trace edema L leg  Patient Profile    Brenda Stephens is a 82 y.o. female admitted for acute on chronic HFpEF, A-fib RVR.  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  Assessment & Plan .     Paroxysmal A-fib RVR Converted to normal sinus rhythm on 02/15/2023 on Cardizem drip.  Still maintaining normal sinus rhythm with heart rates in the 70s.   Continue Eliquis 5 mg twice daily and Toprol-XL 50 mg Noncompliant on Eliquis PTA There is discussion of possible catheter ablation in the future, per Dr. Graciela Husbands Had elevated TSH, not on meds. Per primary team.   Chronic HFpEF Was getting diuresed with IV Lasix 40 mg twice daily.  Had AKI that appears to be  resolving and overall looks euvolemic.  Echocardiogram shows hyperdynamic function 70-75%.  Normal RV function. Would benefit from SGLT2 inhibitor, consider at discharge Continue Toprol-XL 50 mg If BP still uncontrolled, consider spirolactone outpatient.   Hypertension Amlodipine was increased from 5 mg to 7.5 mg recently.  Now looks better controlled with 140s systolic. Continue beta-blocker as above  AKI on CKD Creatinine 1.07 on admission.  Improving renal function now.. Currently 1.01.  Potassium 3.4.  Supplement as needed.   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  For questions or updates, please contact  [facility] Please consult [email or web address] for contact info under        Signed, Abagail Kitchens, PA-C

## 2023-02-17 NOTE — Plan of Care (Signed)

## 2023-02-22 NOTE — Progress Notes (Deleted)
Subjective:    Patient ID: Brenda Stephens, female    DOB: May 29, 1941, 82 y.o.   MRN: [id number]  HPI F former smoker followed for allergic rhinitis, Bronchitis, complicated by HBP, GERD, Anemia, R breast cancer Walk Test 10/12/19- desat to 89% with max HR 95. Mostly sats around 94-96. 03/28/21- 306 meters, average pace, stopping x3 for SOB, lowest O2 sat 95%, max HR 103. Cardiac cath 08/02/19- normal PFT10/26/22- she was again unable to follow directions- when to inhale and when to blow out- tech unable to complete test.  Hgb 12.7 9/13 [facility] ------------------------------------------------------------------------------------  08/25/22- 82 yoF former smoker (3 pk yrs) followed for allergic Rhinitis, acute Bronchitis,  Lung Nodule, complicated by HBP, Gr1DD,  GERD, Anemia, R breast cancer, Alzheimers, DVT/PE/Eliquis,  -albuterol hfa, Flonase,  Covid vax-5 Phizer Flu vax-had Hosp 1/16-1/18- after RDVT/ bilat PE.  Follow-up manage by her PCP. New paresthesias left leg.  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 Albuterol inhaler helps, used occasionally as needed.  No significant wheeze most days. CTaPE 06/30/22- 1. Positive for acute PE with CT evidence of right heart strain (RV/LV Ratio = 1.31 ) consistent with at least submassive (intermediate risk) PE. The presence of right heart strain has been associated with an increased risk of morbidity and mortality. Please refer to the "PE Focused" order set in EPIC. 2. Aortic and coronary artery atherosclerosis. 3. Trace pleural effusions. 4. Hepatic steatosis. 5. Results phoned to Dr. Eudelia Bunch in the ED at 3:03 a.m., 06/30/2022, with verbal acknowledgement of the key findings. Aortic Atherosclerosis (ICD10-I70.0).  02/23/23-  82 yoF former smoker (3 pk yrs) followed for allergic Rhinitis, acute Bronchitis,  Lung Nodule, complicated by HBP, Gr1DD, AFib, dCHF, GERD, Anemia, R breast cancer, Alzheimers, DVT/PE/Eliquis, Dementia,  Hypothyroid, -albuterol hfa, Flonase,   CXR 02/11/23 1V- IMPRESSION: No active disease.  ROS-see HPI   + = positive Constitutional:   No-   weight loss, night sweats, fevers, chills, fatigue, lassitude. HEENT:   + headaches, difficulty swallowing, tooth/dental problems, sore throat,       No-  sneezing, itching, ear ache, nasal congestion, +post nasal drip,  CV:  No-   chest pain, orthopnea, PND, swelling in lower extremities, anasarca,  dizziness, palpitations Resp: + shortness of breath with exertion or at rest.               +productive cough,  No non-productive cough,  No- coughing up of blood.   +   change in color of mucus.  No- wheezing.   Skin: No-   rash or lesions. GI:  No-   heartburn, indigestion, abdominal pain, nausea, vomiting, GU:  MS:  No-   joint pain or swelling.  Neuro-     nothing unusual Psych:  No- change in mood or affect. No depression or anxiety.  No memory loss.    Objective:  OBJ- Physical Exam General- Alert, Oriented, Affect-appropriate, Distress- none acute, + overweight Skin- rash-none, lesions- none, excoriation- none Lymphadenopathy- none Head- atraumatic            Eyes- Gross vision intact, PERRLA, conjunctivae and secretions clear            Ears- Hearing ok,             Nose-, no-Septal dev,  polyps, erosion, perforation             Throat- Mallampati III , mucosa clear , drainage- none, tonsils- atrophic Neck- flexible , trachea midline, no stridor ,  thyroid nl, carotid no bruit Chest - symmetrical excursion , unlabored           Heart/CV- RRR , no murmur , no gallop  , no rub, nl s1 s2                           - JVD- none , edema-none, stasis changes- none, varices- none           Lung- + fine rales, unlabored, wheeze- none, cough none, dullness-none, rub- none           Chest wall-  Abd- Br/ Gen/ Rectal- Not done, not indicated Extrem- cyanosis- none, clubbing, none, atrophy- none, strength- nl.  Neuro- +slight head bob Assessment &  Plan:

## 2023-02-23 ENCOUNTER — Ambulatory Visit: Payer: MEDICARE | Admitting: Internal Medicine

## 2023-02-25 ENCOUNTER — Encounter: Payer: MEDICARE | Admitting: Urology

## 2023-02-25 NOTE — Progress Notes (Deleted)
Assessment: No diagnosis found.   Plan: ***  Chief Complaint: No chief complaint on file.   History of Present Illness:  Brenda Stephens is a 82 y.o. female who is seen for evaluation of ***.   Past Medical History:  Past Medical History:  Diagnosis Date   Acute deep vein thrombosis (DVT) of proximal vein of right lower extremity (HCC) 07/02/2022   Acute pulmonary embolism (HCC) 07/15/2022   Allergy    latex   Anemia    Anxiety    Arthritis    Bilateral pulmonary embolism (HCC) 06/30/2022   Cancer (HCC) 01/2017   right breast cancer   Depression    Dyslipidemia    Eustachian tube dysfunction    GERD (gastroesophageal reflux disease)    Grade I diastolic dysfunction 07/15/2022   Hx of cardiovascular stress test    Myoview 10/13:  apical thinning, EF 72%, no ischemia   Hx of colonic polyps    Dr Ewing Schlein   Hypertension    Hypothyroidism    Other abnormal glucose    Pulmonary embolism (HCC) 06/30/2022    Past Surgical History:  Past Surgical History:  Procedure Laterality Date   ABDOMINAL HYSTERECTOMY     BSO for uterine fibroids   ACHILLES TENDON SURGERY Right 10/07/2012   Procedure: RIGHT REPAIR RUPTURE TENDON PRIMARY OPEN/PERCUTANEOUS ;  Surgeon: Loreta Ave, MD;  Location: [facility];  Service: Orthopedics;  Laterality: Right;   APPENDECTOMY     BREAST LUMPECTOMY WITH RADIOACTIVE SEED AND SENTINEL LYMPH NODE BIOPSY Right 03/16/2017   Procedure: RIGHT BREAST LUMPECTOMY WITH RADIOACTIVE SEED AND RIGHT AXILLARY SENTINEL  NODE BIOPSY ERAS PATHWAY;  Surgeon: Emelia Loron, MD;  Location: [facility];  Service: General;  Laterality: Right;  PECTORAL BLOCK   CATARACT EXTRACTION, BILATERAL     CHOLECYSTECTOMY     COLONOSCOPY W/ POLYPECTOMY     Dr Ewing Schlein   EAR CYST EXCISION Right 10/07/2012   Procedure: EXCISION BONE CYST BENIGN TUMOR Manuela Neptune ;  Surgeon: Loreta Ave, MD;  Location:  SURGERY CENTER;  Service:  Orthopedics;  Laterality: Right;   EXPLORATORY LAPAROTOMY     LEFT HEART CATH AND CORONARY ANGIOGRAPHY N/A 08/02/2019   Procedure: LEFT HEART CATH AND CORONARY ANGIOGRAPHY;  Surgeon: [place], Peter M, MD;  Location: [facility];  Service: Cardiovascular;  Laterality: N/A;   REVISION TOTAL SHOULDER TO REVERSE TOTAL SHOULDER Left 01/12/2019   Procedure: REVISION TOTAL SHOULDER TO REVERSE TOTAL SHOULDER;  Surgeon: Bjorn Pippin, MD;  Location: [facility];  Service: Orthopedics;  Laterality: Left;   SINUS SURGERY WITH INSTATRAK     TOTAL SHOULDER REPLACEMENT     Right shoulder   TOTAL SHOULDER REPLACEMENT  05/2010   Left   TUBAL LIGATION      Allergies:  Allergies  Allergen Reactions   Latex Rash   Sulfa Antibiotics Anaphylaxis and Nausea Only   Aspirin Other (See Comments)    GI bleeding   Nsaids Other (See Comments)    GI upset    Oxycodone-Acetaminophen Nausea Only    Family History:  Family History  Problem Relation Age of Onset   Hiatal hernia Mother    Heart attack Mother 83   Heart disease Father 40       CAD @ autopsy   Cancer Father        renal cancer   Heart attack Brother 65       fatal   Diabetes  Neg Hx    Stroke Neg Hx     Social History:  Social History   Tobacco Use   Smoking status: Former    Current packs/day: 0.00    Average packs/day: 0.5 packs/day for 6.0 years (3.0 ttl pk-yrs)    Types: Cigarettes    Start date: 06/30/1956    Quit date: 06/30/1962    Years since quitting: 60.6   Smokeless tobacco: Never   Tobacco comments:    Quit age 89  Vaping Use   Vaping status: Never Used  Substance Use Topics   Alcohol use: Yes    Alcohol/week: 3.0 standard drinks of alcohol    Types: 3 Glasses of wine per week    Comment: wine  : < 3 glasses weekly   Drug use: No    Review of symptoms:  Constitutional:  Negative for unexplained weight loss, night sweats, fever, chills ENT:  Negative for nose bleeds, sinus pain, painful swallowing CV:  Negative  for chest pain, shortness of breath, exercise intolerance, palpitations, loss of consciousness Resp:  Negative for cough, wheezing, shortness of breath GI:  Negative for nausea, vomiting, diarrhea, bloody stools GU:  Positives noted in HPI; otherwise negative for gross hematuria, dysuria, urinary incontinence Neuro:  Negative for seizures, poor balance, limb weakness, slurred speech Psych:  Negative for lack of energy, depression, anxiety Endocrine:  Negative for polydipsia, polyuria, symptoms of hypoglycemia (dizziness, hunger, sweating) Hematologic:  Negative for anemia, purpura, petechia, prolonged or excessive bleeding, use of anticoagulants  Allergic:  Negative for difficulty breathing or choking as a result of exposure to anything; no shellfish allergy; no allergic response (rash/itch) to materials, foods  Physical exam: There were no vitals taken for this visit. GENERAL APPEARANCE:  Well appearing, well developed, well nourished, NAD HEENT: Atraumatic, Normocephalic, oropharynx clear. NECK: Supple without lymphadenopathy or thyromegaly. LUNGS: Clear to auscultation bilaterally. HEART: Regular Rate and Rhythm without murmurs, gallops, or rubs. ABDOMEN: Soft, non-tender, No Masses. EXTREMITIES: Moves all extremities well.  Without clubbing, cyanosis, or edema. NEUROLOGIC:  Alert and oriented x 3, normal gait, CN II-XII grossly intact.  MENTAL STATUS:  Appropriate. BACK:  Non-tender to palpation.  No CVAT SKIN:  Warm, dry and intact.    Results: No results found for this or any previous visit (from the past 24 hour(s)).

## 2023-03-02 NOTE — Progress Notes (Deleted)
Cardiology Clinic Note   Patient Name: Brenda Stephens Date of Encounter: 03/02/2023  Primary Care Provider:  Verlee Rossetti, PA-C Primary Cardiologist:  Brenda Rotunda, MD  Patient Profile    82 y.o. female admitted for acute on chronic HFpEF, A-fib RVR.  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  Had recent hospitalization from 02/11/2023 through 02/17/2023 in the setting of rapid atrial fibrillation, with associated lower extremity edema and chest heaviness.  Within his converted to normal sinus rhythm on 02/15/2023 on Cardizem drip, on Eliquis 5 mg twice daily and Toprol 50 mg daily.  Had a history of DVT and was noncompliant with Eliquis.  She is now agreed to take it.  Amlodipine was increased to 100 mg daily on discharge.  Past Medical History    Past Medical History:  Diagnosis Date   Acute deep vein thrombosis (DVT) of proximal vein of right lower extremity (HCC) 07/02/2022   Acute pulmonary embolism (HCC) 07/15/2022   Allergy    latex   Anemia    Anxiety    Arthritis    Bilateral pulmonary embolism (HCC) 06/30/2022   Cancer (HCC) 01/2017   right breast cancer   Depression    Dyslipidemia    Eustachian tube dysfunction    GERD (gastroesophageal reflux disease)    Grade I diastolic dysfunction 07/15/2022   Hx of cardiovascular stress test    Myoview 10/13:  apical thinning, EF 72%, no ischemia   Hx of colonic polyps    Brenda Stephens   Hypertension    Hypothyroidism    Other abnormal glucose    Pulmonary embolism (HCC) 06/30/2022   Past Surgical History:  Procedure Laterality Date   ABDOMINAL HYSTERECTOMY     BSO for uterine fibroids   ACHILLES TENDON SURGERY Right 10/07/2012   Procedure: RIGHT REPAIR RUPTURE TENDON PRIMARY OPEN/PERCUTANEOUS ;  Surgeon: Brenda Ave, MD;  Location: [facility];  Service: Orthopedics;  Laterality: Right;   APPENDECTOMY     BREAST  LUMPECTOMY WITH RADIOACTIVE SEED AND SENTINEL LYMPH NODE BIOPSY Right 03/16/2017   Procedure: RIGHT BREAST LUMPECTOMY WITH RADIOACTIVE SEED AND RIGHT AXILLARY SENTINEL  NODE BIOPSY ERAS PATHWAY;  Surgeon: Brenda Loron, MD;  Location: [facility];  Service: General;  Laterality: Right;  PECTORAL BLOCK   CATARACT EXTRACTION, BILATERAL     CHOLECYSTECTOMY     COLONOSCOPY W/ POLYPECTOMY     Brenda Stephens   EAR CYST EXCISION Right 10/07/2012   Procedure: EXCISION BONE CYST BENIGN TUMOR Brenda Stephens ;  Surgeon: Brenda Ave, MD;  Location: [facility];  Service: Orthopedics;  Laterality: Right;   EXPLORATORY LAPAROTOMY     LEFT HEART CATH AND CORONARY ANGIOGRAPHY N/A 08/02/2019   Procedure: LEFT HEART CATH AND CORONARY ANGIOGRAPHY;  Surgeon: Stephens, Brenda M, MD;  Location: [facility];  Service: Cardiovascular;  Laterality: N/A;   REVISION TOTAL SHOULDER TO REVERSE TOTAL SHOULDER Left 01/12/2019   Procedure: REVISION TOTAL SHOULDER TO REVERSE TOTAL SHOULDER;  Surgeon: Brenda Pippin, MD;  Location: [facility];  Service: Orthopedics;  Laterality: Left;   SINUS SURGERY WITH INSTATRAK     TOTAL SHOULDER REPLACEMENT     Right shoulder   TOTAL SHOULDER REPLACEMENT  05/2010   Left   TUBAL LIGATION      Allergies  Allergies  Allergen Reactions   Latex Rash   Sulfa Antibiotics Anaphylaxis and Nausea Only   Aspirin Other (  See Comments)    GI bleeding   Nsaids Other (See Comments)    GI upset    Oxycodone-Acetaminophen Nausea Only    History of Present Illness    [name] returns to the office today posthospitalization after being admitted for A-fib RVR, found to have a DVT, HFpEF.converted to normal sinus rhythm on 02/15/2023 on Cardizem drip, on Eliquis 5 mg twice daily and Toprol 50 mg daily.  Had a history of DVT and was noncompliant with Eliquis.  She is now agreed to take it.  Amlodipine was increased to 100 mg daily on discharge. Weight 198 lbs.  Home  Medications    Current Outpatient Medications  Medication Sig Dispense Refill   acetaminophen (TYLENOL) 650 MG CR tablet Take 1 tablet (650 mg total) by mouth every 8 (eight) hours as needed for pain (headaches). (Patient taking differently: Take 1,300 mg by mouth every 8 (eight) hours as needed for pain (or headaches).)     albuterol (VENTOLIN HFA) 108 (90 Base) MCG/ACT inhaler Inhale 2 puffs into the lungs every 6 (six) hours as needed for wheezing or shortness of breath. 18 g 12   amLODipine (NORVASC) 10 MG tablet Take 1 tablet (10 mg total) by mouth daily. 30 tablet 2   apixaban (ELIQUIS) 5 MG TABS tablet Take 1 tablet (5 mg total) by mouth 2 (two) times daily. 60 tablet 1   benzonatate (TESSALON) 200 MG capsule TAKE 1 CAPSULE (200 MG TOTAL) BY MOUTH 3 (THREE) TIMES DAILY AS NEEDED FOR COUGH. 30 capsule 1   Biotin 38756 MCG TABS Take 10,000 mcg by mouth daily.     cetirizine (ZYRTEC) 10 MG tablet Take 10 mg by mouth at bedtime.     donepezil (ARICEPT) 10 MG tablet Take 10 mg by mouth at bedtime.     hydrOXYzine (ATARAX) 25 MG tablet Take 1 tablet (25 mg total) by mouth every 6 (six) hours as needed for anxiety. 20 tablet 0   levothyroxine (SYNTHROID) 25 MCG tablet Take 12.5 mcg by mouth daily before breakfast.     meclizine (ANTIVERT) 25 MG tablet Take 1 tablet (25 mg total) by mouth 3 (three) times daily as needed for dizziness. (Patient taking differently: Take 25 mg by mouth 3 (three) times daily as needed for dizziness or nausea.) 30 tablet 0   memantine (NAMENDA) 10 MG tablet Take 10 mg by mouth 2 (two) times daily.     metoprolol succinate (TOPROL XL) 50 MG 24 hr tablet Take 1 tablet (50 mg total) by mouth at bedtime. 30 tablet 2   NEXIUM 24HR 20 MG capsule Take 40 mg by mouth daily before breakfast.     Olopatadine HCl (PATADAY OP) Place 1 drop into both eyes daily as needed (for allergies).     oxybutynin (DITROPAN) 5 MG tablet Take 5 mg by mouth 2 (two) times daily.     Polyethyl  Glycol-Propyl Glycol (SYSTANE OP) Place 1 drop into both eyes 2 (two) times daily as needed (for dryness).     potassium chloride SA (KLOR-CON Stephens) 20 MEQ tablet Take 1 tablet (20 mEq total) by mouth daily. 5 tablet 0   QUEtiapine (SEROQUEL) 50 MG tablet Take 50 mg by mouth at bedtime.     RESTASIS 0.05 % ophthalmic emulsion Place 1 drop into both eyes 2 (two) times daily.     sennosides-docusate sodium (SENOKOT-S) 8.6-50 MG tablet Take 1 tablet by mouth daily as needed for constipation.     tamsulosin (FLOMAX) 0.4  MG CAPS capsule Take 0.4 mg by mouth at bedtime.     venlafaxine XR (EFFEXOR-XR) 150 MG 24 hr capsule TAKE 1 CAPSULE (150 MG TOTAL) BY MOUTH DAILY. (Patient taking differently: Take 150 mg by mouth daily.) 90 capsule 2   No current facility-administered medications for this visit.     Family History    Family History  Problem Relation Age of Onset   Hiatal hernia Mother    Heart attack Mother 65   Heart disease Father 45       CAD @ autopsy   Cancer Father        renal cancer   Heart attack Brother 68       fatal   Diabetes Neg Hx    Stroke Neg Hx    She indicated that her mother is deceased. She indicated that her father is deceased. She indicated that her brother is deceased. She indicated that the status of her neg hx is unknown.  Social History    Social History   Socioeconomic History   Marital status: Widowed    Spouse name: Not on file   Number of children: 1   Years of education: Not on file   Highest education level: Not on file  Occupational History   Not on file  Tobacco Use   Smoking status: Former    Current packs/day: 0.00    Average packs/day: 0.5 packs/day for 6.0 years (3.0 ttl pk-yrs)    Types: Cigarettes    Start date: 06/30/1956    Quit date: 06/30/1962    Years since quitting: 60.7   Smokeless tobacco: Never   Tobacco comments:    Quit age 48  Vaping Use   Vaping status: Never Used  Substance and Sexual Activity   Alcohol use: Yes     Alcohol/week: 3.0 standard drinks of alcohol    Types: 3 Glasses of wine per week    Comment: wine  : < 3 glasses weekly   Drug use: No   Sexual activity: Yes    Birth control/protection: Post-menopausal, Surgical  Other Topics Concern   Not on file  Social History Narrative   Lives husband and daughter lives with her.    Social Determinants of Health   Financial Resource Strain: Low Risk  (03/12/2021)   Received from [facility] visits prior to 08/30/2022., [facility] visits prior to 08/30/2022.   Overall Financial Resource Strain (CARDIA)    Difficulty of Paying Living Expenses: Not very hard  Food Insecurity: No Food Insecurity (02/12/2023)   Hunger Vital Sign    Worried About Running Out of Food in the Last Year: Never true    Ran Out of Food in the Last Year: Never true  Transportation Needs: No Transportation Needs (02/12/2023)   PRAPARE - Administrator, Civil Service (Medical): No    Lack of Transportation (Non-Medical): No  Physical Activity: Inactive (03/12/2021)   Received from [facility] visits prior to 08/30/2022., [facility] visits prior to 08/30/2022.   Exercise Vital Sign    Days of Exercise per Week: 0 days    Minutes of Exercise per Session: 0 min  Stress: Stress Concern Present (03/12/2021)   [facility] prior to 08/30/2022., [facility] visits prior to 08/30/2022.   [employer] of Occupational Health - Occupational Stress Questionnaire    Feeling of Stress :  Rather much  Social Connections: Moderately Isolated (03/12/2021)   Received from [facility] visits prior to 08/30/2022., [facility] visits prior to 08/30/2022.   Social Advertising account executive [NHANES]    Frequency of Communication with Friends and Family: Twice a week    Frequency of Social Gatherings  with Friends and Family: Twice a week    Attends Religious Services: 1 to 4 times per year    Active Member of [employer] or Organizations: No    Attends Banker Meetings: Never    Marital Status: Widowed  Intimate Partner Violence: Not At Risk (02/12/2023)   Humiliation, Afraid, Rape, and Kick questionnaire    Fear of Current or Ex-Partner: No    Emotionally Abused: No    Physically Abused: No    Sexually Abused: No     Review of Systems    General:  No chills, fever, night sweats or weight changes.  Cardiovascular:  No chest pain, dyspnea on exertion, edema, orthopnea, palpitations, paroxysmal nocturnal dyspnea. Dermatological: No rash, lesions/masses Respiratory: No cough, dyspnea Urologic: No hematuria, dysuria Abdominal:   No nausea, vomiting, diarrhea, bright red blood per rectum, melena, or hematemesis Neurologic:  No visual changes, wkns, changes in mental status. All other systems reviewed and are otherwise negative except as noted above.       Physical Exam    VS:  There were no vitals taken for this visit. , BMI There is no height or weight on file to calculate BMI.     GEN: Well nourished, well developed, in no acute distress. HEENT: normal. Neck: Supple, no JVD, carotid bruits, or masses. Cardiac: RRR, no murmurs, rubs, or gallops. No clubbing, cyanosis, edema.  Radials/DP/PT 2+ and equal bilaterally.  Respiratory:  Respirations regular and unlabored, clear to auscultation bilaterally. GI: Soft, nontender, nondistended, BS + x 4. MS: no deformity or atrophy. Skin: warm and dry, no rash. Neuro:  Strength and sensation are intact. Psych: Normal affect.      Lab Results  Component Value Date   WBC 6.7 02/17/2023   HGB 11.7 (L) 02/17/2023   HCT 35.4 (L) 02/17/2023   MCV 88.5 02/17/2023   PLT 247 02/17/2023   Lab Results  Component Value Date   CREATININE 1.01 (H) 02/17/2023   BUN 23 02/17/2023   NA 132 (L) 02/17/2023   K 3.4 (L) 02/17/2023   CL  97 (L) 02/17/2023   CO2 23 02/17/2023   Lab Results  Component Value Date   ALT 26 02/12/2023   AST 28 02/12/2023   ALKPHOS 51 02/12/2023   BILITOT 0.8 02/12/2023   Lab Results  Component Value Date   CHOL 208 (H) 07/19/2012   HDL 70.30 07/19/2012   LDLCALC 91 06/13/2011   LDLDIRECT 108.4 07/19/2012   TRIG 152.0 (H) 07/19/2012   CHOLHDL 3 07/19/2012    Lab Results  Component Value Date   HGBA1C 6.1 06/13/2011     Review of Prior Studies    Echocardiogram 02/12/2023  1. Left ventricular ejection fraction, by estimation, is 70 to 75%. The  left ventricle has hyperdynamic function. The left ventricle has no  regional wall motion abnormalities. Left ventricular diastolic parameters  are indeterminate.   2. Right ventricular systolic function is normal. The right ventricular  size is normal.   3. Left atrial size was mildly dilated.   4. The mitral valve is normal in structure. No evidence of mitral valve  regurgitation. No evidence  of mitral stenosis.   5. The aortic valve is tricuspid. There is mild calcification of the  aortic valve. Aortic valve regurgitation is not visualized. No aortic  stenosis is present.   6. The inferior vena cava is normal in size with greater than 50%  respiratory variability, suggesting right atrial pressure of 3 mmHg.   Vascular Ultrasound 02/12/2023 Summary:  RIGHT:  - There is no evidence of deep vein thrombosis in the lower extremity.  However, portions of this examination were limited- see technologist  comments above.    - No cystic structure found in the popliteal fossa.    LEFT:  - There is no evidence of deep vein thrombosis in the lower extremity.  However, portions of this examination were limited- see technologist  comments above.    - No cystic structure found in the popliteal fossa.   Assessment & Plan   1.  ***     {Are you ordering a CV Procedure (e.g. stress test, cath, DCCV, TEE, etc)?   Press F2         :[id number]}   Signed, Bettey Mare. Liborio Nixon, ANP, AACC   03/02/2023 10:00 AM      Office [phone] Fax [phone]  Notice: This dictation was prepared with Dragon dictation along with smaller phrase technology. Any transcriptional errors that result from this process are unintentional and may not be corrected upon review.

## 2023-03-04 ENCOUNTER — Other Ambulatory Visit: Payer: Self-pay

## 2023-03-04 ENCOUNTER — Ambulatory Visit: Payer: MEDICARE | Admitting: Adult Health

## 2023-03-04 ENCOUNTER — Emergency Department (HOSPITAL_COMMUNITY): Payer: MEDICARE

## 2023-03-04 ENCOUNTER — Telehealth: Payer: Self-pay | Admitting: Cardiology

## 2023-03-04 ENCOUNTER — Encounter (HOSPITAL_COMMUNITY): Payer: Self-pay

## 2023-03-04 ENCOUNTER — Emergency Department (HOSPITAL_COMMUNITY)
Admission: EM | Admit: 2023-03-04 | Discharge: 2023-03-04 | Disposition: A | Discharge status: Home / Self Care | Payer: MEDICARE | Attending: Emergency Medicine | Admitting: Emergency Medicine

## 2023-03-04 DIAGNOSIS — R079 Chest pain, unspecified: Secondary | ICD-10-CM | POA: Diagnosis present

## 2023-03-04 DIAGNOSIS — Z79899 Other long term (current) drug therapy: Secondary | ICD-10-CM | POA: Insufficient documentation

## 2023-03-04 DIAGNOSIS — Z91040 Latex allergy status: Secondary | ICD-10-CM | POA: Insufficient documentation

## 2023-03-04 DIAGNOSIS — I110 Hypertensive heart disease with heart failure: Secondary | ICD-10-CM | POA: Insufficient documentation

## 2023-03-04 DIAGNOSIS — I480 Paroxysmal atrial fibrillation: Secondary | ICD-10-CM | POA: Diagnosis not present

## 2023-03-04 DIAGNOSIS — I10 Essential (primary) hypertension: Secondary | ICD-10-CM | POA: Diagnosis not present

## 2023-03-04 DIAGNOSIS — I5032 Chronic diastolic (congestive) heart failure: Secondary | ICD-10-CM

## 2023-03-04 DIAGNOSIS — Z7901 Long term (current) use of anticoagulants: Secondary | ICD-10-CM | POA: Insufficient documentation

## 2023-03-04 DIAGNOSIS — I509 Heart failure, unspecified: Secondary | ICD-10-CM | POA: Insufficient documentation

## 2023-03-04 LAB — BRAIN NATRIURETIC PEPTIDE: B Natriuretic Peptide: 94.1 pg/mL (ref 0.0–100.0)

## 2023-03-04 LAB — BASIC METABOLIC PANEL
Anion gap: 14 (ref 5–15)
BUN: 11 mg/dL (ref 8–23)
CO2: 20 mmol/L — ABNORMAL LOW (ref 22–32)
Calcium: 9 mg/dL (ref 8.9–10.3)
Chloride: 101 mmol/L (ref 98–111)
Creatinine, Ser: 0.93 mg/dL (ref 0.44–1.00)
GFR, Estimated: >60 mL/min (ref >60)
Glucose, Bld: 110 mg/dL — ABNORMAL HIGH (ref 70–99)
Potassium: 3.9 mmol/L (ref 3.5–5.1)
Sodium: 135 mmol/L (ref 135–145)

## 2023-03-04 LAB — TROPONIN I (HIGH SENSITIVITY)
Troponin I (High Sensitivity): 9 ng/L (ref <18)
Troponin I (High Sensitivity): 8 ng/L (ref <18)

## 2023-03-04 LAB — CBC
HCT: 36 % (ref 36.0–46.0)
Hemoglobin: 11.6 g/dL — ABNORMAL LOW (ref 12.0–15.0)
MCH: 28.2 pg (ref 26.0–34.0)
MCHC: 32.2 g/dL (ref 30.0–36.0)
MCV: 87.6 fL (ref 80.0–100.0)
Platelets: 272 10*3/uL (ref 150–400)
RBC: 4.11 MIL/uL (ref 3.87–5.11)
RDW: 12.6 % (ref 11.5–15.5)
WBC: 7.9 10*3/uL (ref 4.0–10.5)
nRBC: 0 % (ref 0.0–0.2)

## 2023-03-04 MED ORDER — PANTOPRAZOLE SODIUM 40 MG PO TBEC: 40.0000 mg | DELAYED_RELEASE_TABLET | Freq: Every day | ORAL | 0 refills | Status: DC

## 2023-03-04 MED ORDER — NITROGLYCERIN 0.4 MG SL SUBL
0.4000 mg | SUBLINGUAL_TABLET | SUBLINGUAL | Status: DC | PRN
  Administered 2023-03-04: 0.4 mg via SUBLINGUAL
  Filled 2023-03-04: qty 1

## 2023-03-04 MED ORDER — ACETAMINOPHEN 500 MG PO TABS
1000.0000 mg | ORAL_TABLET | Freq: Once | ORAL | Status: AC
  Administered 2023-03-04: 1000 mg via ORAL
  Filled 2023-03-04: qty 2

## 2023-03-04 NOTE — Telephone Encounter (Signed)
FYI Spoke with daughter per DPR. Patient was taken by EMS this morning to ED. She was in AFIB her heart rate was not elevated but her BP was 180/120.

## 2023-03-04 NOTE — Consult Note (Signed)
Cardiology Consultation   Patient ID: Brenda Stephens MRN: [id number]; DOB: [birth year]  Admit date: 03/04/2023 Date of Consult: 03/04/2023  PCP:  Verlee Rossetti, PA-C   Brenda Stephens Providers Cardiologist:  Rollene Rotunda, MD   {  Patient Profile:   Brenda Stephens is a 82 y.o. 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 Karene Fry.  History of Present Illness:   Brenda Stephens has recent admission on 02/12/2023 after being evaluated outpatient and found to have new onset atrial fibrillation/flutter with RVR.  Initially she presented with shortness of breath and chest pressure thought related to her RVR.  She had minimally elevated troponins that were flat.  During admission she spontaneously converted to normal sinus rhythm on Cardizem drip.  She also had some issues with volume and was given IV Lasix 40 mg twice daily however that was titrated down at the last days of her admission.  Patient also had poorly controlled hypertension so amlodipine was increased to 7.5 mg.  Currently patient is being seen in the emergency room due to recurrent episodes of chest pressure since her PE without any associated symptoms such as radiation, nausea, diaphoresis.  She also reports sometimes these episodes occur after eating food.  She does note some shortness of breath with this episodes and decided to seek emergency evaluation however, she does have asthma.  Her daughter is accompanied with her today who feels that much of her chest pressure is likely related to her anxiety.  She had a cardiology appointment today and seem to be anxious for this.  At the bedside she looks extremely tense.  Blood pressure is elevated in the 160s.  She denies any other significant  symptoms such as peripheral edema, dizziness, palpitations.  Patient would like to go home.  Overall workup has been unremarkable.  EKG without any acute ischemic ST-T wave changes.  Troponins negative x 2.  Negative chest x-ray.  Had mild AKI last admission however has normalized and has normal renal function today.  Past Medical History:  Diagnosis Date   Acute deep vein thrombosis (DVT) of proximal vein of right lower extremity (HCC) 07/02/2022   Acute pulmonary embolism (HCC) 07/15/2022   Allergy    latex   Anemia    Anxiety    Arthritis    Bilateral pulmonary embolism (HCC) 06/30/2022   Cancer (HCC) 01/2017   right breast cancer   Depression    Dyslipidemia    Eustachian tube dysfunction    GERD (gastroesophageal reflux disease)    Grade I diastolic dysfunction 07/15/2022   Hx of cardiovascular stress test    Myoview 10/13:  apical thinning, EF 72%, no ischemia   Hx of colonic polyps    Dr Ewing Schlein   Hypertension    Hypothyroidism    Other abnormal glucose    Pulmonary embolism (HCC) 06/30/2022    Past Surgical History:  Procedure Laterality Date   ABDOMINAL HYSTERECTOMY     BSO for uterine fibroids   ACHILLES TENDON SURGERY Right 10/07/2012   Procedure: RIGHT REPAIR RUPTURE TENDON PRIMARY OPEN/PERCUTANEOUS ;  Surgeon: Loreta Ave, MD;  Location: [facility];  Service: Orthopedics;  Laterality: Right;   APPENDECTOMY     BREAST LUMPECTOMY WITH RADIOACTIVE SEED AND SENTINEL LYMPH  NODE BIOPSY Right 03/16/2017   Procedure: RIGHT BREAST LUMPECTOMY WITH RADIOACTIVE SEED AND RIGHT AXILLARY SENTINEL  NODE BIOPSY ERAS PATHWAY;  Surgeon: Emelia Loron, MD;  Location: [facility];  Service: General;  Laterality: Right;  PECTORAL BLOCK   CATARACT EXTRACTION, BILATERAL     CHOLECYSTECTOMY     COLONOSCOPY W/ POLYPECTOMY     Dr Ewing Schlein   EAR CYST EXCISION Right 10/07/2012   Procedure: EXCISION BONE CYST BENIGN TUMOR Manuela Neptune ;  Surgeon: Loreta Ave, MD;  Location: [facility];  Service: Orthopedics;  Laterality: Right;   EXPLORATORY LAPAROTOMY     LEFT HEART CATH AND CORONARY ANGIOGRAPHY N/A 08/02/2019   Procedure: LEFT HEART CATH AND CORONARY ANGIOGRAPHY;  Surgeon: Swaziland, Peter M, MD;  Location: [facility] INVASIVE CV [facility];  Service: Cardiovascular;  Laterality: N/A;   REVISION TOTAL SHOULDER TO REVERSE TOTAL SHOULDER Left 01/12/2019   Procedure: REVISION TOTAL SHOULDER TO REVERSE TOTAL SHOULDER;  Surgeon: Bjorn Pippin, MD;  Location: [facility];  Service: Orthopedics;  Laterality: Left;   SINUS SURGERY WITH INSTATRAK     TOTAL SHOULDER REPLACEMENT     Right shoulder   TOTAL SHOULDER REPLACEMENT  05/2010   Left   TUBAL LIGATION      Inpatient Medications: Scheduled Meds:  Continuous Infusions:  PRN Meds: nitroGLYCERIN  Allergies:    Allergies  Allergen Reactions   Latex Rash   Sulfa Antibiotics Anaphylaxis and Nausea Only   Aspirin Other (See Comments)    GI bleeding   Nsaids Other (See Comments)    GI upset    Oxycodone-Acetaminophen Nausea Only    Social History:   Social History   Socioeconomic History   Marital status: Widowed    Spouse name: Not on file   Number of children: 1   Years of education: Not on file   Highest education level: Not on file  Occupational History   Not on file  Tobacco Use   Smoking status: Former    Current packs/day: 0.00    Average packs/day: 0.5 packs/day for 6.0 years (3.0 ttl pk-yrs)    Types: Cigarettes    Start date: 06/30/1956    Quit date: 06/30/1962    Years since quitting: 60.7   Smokeless tobacco: Never   Tobacco comments:    Quit age 56  Vaping Use   Vaping status: Never Used  Substance and Sexual Activity   Alcohol use: Yes    Alcohol/week: 3.0 standard drinks of alcohol    Types: 3 Glasses of wine per week    Comment: wine  : < 3 glasses weekly   Drug use: No   Sexual activity: Yes    Birth control/protection: Post-menopausal, Surgical  Other  Topics Concern   Not on file  Social History Narrative   Lives husband and daughter lives with her.    Social Determinants of Health   Financial Resource Strain: Low Risk  (03/12/2021)   Received from [facility] visits prior to 08/30/2022., [facility] visits prior to 08/30/2022.   Overall Financial Resource Strain (CARDIA)    Difficulty of Paying Living Expenses: Not very hard  Food Insecurity: No Food Insecurity (02/12/2023)   Hunger Vital Sign    Worried About Running Out of Food in the Last Year: Never true    Ran Out of Food in the Last Year: Never true  Transportation Needs: No Transportation Needs (02/12/2023)   PRAPARE - Transportation  Lack of Transportation (Medical): No    Lack of Transportation (Non-Medical): No  Physical Activity: Inactive (03/12/2021)   Received from [facility] visits prior to 08/30/2022., [facility] visits prior to 08/30/2022.   Exercise Vital Sign    Days of Exercise per Week: 0 days    Minutes of Exercise per Session: 0 min  Stress: Stress Concern Present (03/12/2021)   [facility] from [facility] visits prior to 08/30/2022., [facility] visits prior to 08/30/2022.   [employer] of Occupational Health - Occupational Stress Questionnaire    Feeling of Stress : Rather much  Social Connections: Moderately Isolated (03/12/2021)   Received from [facility] visits prior to 08/30/2022., [facility] visits prior to 08/30/2022.   Social Advertising account executive [NHANES]    Frequency of Communication with Friends and Family: Twice a week    Frequency of Social Gatherings with Friends and Family: Twice a week    Attends Religious Services: 1 to 4 times per year    Active Member of [employer] or Organizations: No    Attends Banker Meetings: Never    Marital Status: Widowed  Intimate  Partner Violence: Not At Risk (02/12/2023)   Humiliation, Afraid, Rape, and Kick questionnaire    Fear of Current or Ex-Partner: No    Emotionally Abused: No    Physically Abused: No    Sexually Abused: No    Family History:  Family History  Problem Relation Age of Onset   Hiatal hernia Mother    Heart attack Mother 46   Heart disease Father 13       CAD @ autopsy   Cancer Father        renal cancer   Heart attack Brother 17       fatal   Diabetes Neg Hx    Stroke Neg Hx      ROS:  Please see the history of present illness.  All other ROS reviewed and negative.     Physical Exam/Data:   Vitals:   03/04/23 0930 03/04/23 1000 03/04/23 1030 03/04/23 1304  BP: (!) 144/76 (!) 160/62 (!) 162/70   Pulse: 60 (!) 57 62 66  Resp: 16 12 16  (!) 22  Temp:    (!) 97.5 F (36.4 C)  TempSrc:    Oral  SpO2: 100% 98% 99% 98%   No intake or output data in the 24 hours ending 03/04/23 1502    02/17/2023    5:53 AM 02/16/2023    6:26 AM 02/15/2023    5:25 AM  Last 3 Weights  Weight (lbs) 198 lb 13.7 oz 187 lb 9.6 oz 202 lb 13.2 oz  Weight (kg) 90.2 kg 85.095 kg 92 kg     There is no height or weight on file to calculate BMI.  General:  Well nourished, well developed, in no acute distress HEENT: normal Neck: no JVD Vascular: No carotid bruits; Distal pulses 2+ bilaterally Cardiac:  normal S1, S2; RRR; no murmur  Lungs:  clear to auscultation bilaterally, no wheezing, rhonchi or rales  Abd: soft, nontender, no hepatomegaly  Ext: no edema Musculoskeletal:  No deformities, BUE and BLE strength normal and equal Skin: warm and dry  Neuro:  CNs 2-12 intact, no focal abnormalities noted Psych:  Normal affect   EKG:  The EKG was personally reviewed and demonstrates: Normal sinus rhythm heart rate 70s.  No  acute ST-T wave changes. Telemetry:  Telemetry was personally reviewed and demonstrates: Normal sinus rhythm heart rate 70s  Relevant CV Studies: Echocardiogram 02/12/2023 1. Left  ventricular ejection fraction, by estimation, is 70 to 75%. The  left ventricle has hyperdynamic function. The left ventricle has no  regional wall motion abnormalities. Left ventricular diastolic parameters  are indeterminate.   2. Right ventricular systolic function is normal. The right ventricular  size is normal.   3. Left atrial size was mildly dilated.   4. The mitral valve is normal in structure. No evidence of mitral valve  regurgitation. No evidence of mitral stenosis.   5. The aortic valve is tricuspid. There is mild calcification of the  aortic valve. Aortic valve regurgitation is not visualized. No aortic  stenosis is present.   6. The inferior vena cava is normal in size with greater than 50%  respiratory variability, suggesting right atrial pressure of 3 mmHg.   Conclusion(s)/Recommendation(s): RV not well seen but appears grossly  normal.   Left heart catheterization 08/02/2019 The left ventricular systolic function is normal. LV end diastolic pressure is normal. The left ventricular ejection fraction is 55-65% by visual estimate.   1. Normal coronary anatomy 2. Normal LV function 3. Normal LVEDP   Plan: medical management.     Laboratory Data:  High Sensitivity Troponin:   Recent Labs  Lab 02/14/23 1007 02/14/23 1346 02/14/23 1506 03/04/23 1006 03/04/23 1310  TROPONINIHS 13 13 14 9 8      Chemistry Recent Labs  Lab 03/04/23 1006  NA 135  K 3.9  CL 101  CO2 20*  GLUCOSE 110*  BUN 11  CREATININE 0.93  CALCIUM 9.0  GFRNONAA >60  ANIONGAP 14    No results for input(s): "PROT", "ALBUMIN", "AST", "ALT", "ALKPHOS", "BILITOT" in the last 168 hours. Lipids No results for input(s): "CHOL", "TRIG", "HDL", "LABVLDL", "LDLCALC", "CHOLHDL" in the last 168 hours.  Hematology Recent Labs  Lab 03/04/23 1006  WBC 7.9  RBC 4.11  HGB 11.6*  HCT 36.0  MCV 87.6  MCH 28.2  MCHC 32.2  RDW 12.6  PLT 272   Thyroid No results for input(s): "TSH", "FREET4" in  the last 168 hours.  BNP Recent Labs  Lab 03/04/23 1014  BNP 94.1    DDimer No results for input(s): "DDIMER" in the last 168 hours.   Radiology/Studies:  DG Chest Port 1 View  Result Date: 03/04/2023 CLINICAL DATA:  Chest pain. EXAM: PORTABLE CHEST 1 VIEW COMPARISON:  February 11, 2023. FINDINGS: Stable cardiomediastinal silhouette. Both lungs are clear. 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 IMPRESSION: No active disease. Electronically Signed   By: Lupita Raider M.D.   On: 03/04/2023 10:59     Assessment and Plan:   Recurrent chest pain Patient presenting to the emergency room with chronic complaints of chest pain that sound somewhat atypical in that they are associated with food and are nonexertional without any associated symptoms.  She reports these episodes have been going on since she has had a PE.  Workup so far has been unremarkable for any underlying cardiac etiologies.  EKG without any acute ST-T wave changes.  Troponins negative x 2.  BNP negative.  She looks euvolemic on exam.  I suspect that some of her chest pain may be related to anxiety, asthma, GERD.  Discussed with MD she is stable for discharge.  Discussed with patient and daughter and they are in agreement with this.  Consider discharging on PPI for GERD  Paroxysmal atrial fibrillation Has previous admission for this.  Converted to normal sinus rhythm on 02/15/2023 and maintaining normal sinus rhythm here today.  Continue Eliquis 5 mg twice daily and Toprol-XL 50 mg.  She did have elevated TSH.  Treatment per PCP.   Chronic HFpEF Overall looks to be euvolemic.  BNP is negative.  She has hyperdynamic function 70 to 75%.  Normal RV function.  She would likely benefit from SGLT2 inhibitor and possibly spironolactone if blood pressure still uncontrolled.  Consider outpatient.  Hypertension Last admission amlodipine was increased to 7.5 mg.  Uncontrolled here with blood pressure in the 160s.  However seems to be  extremely anxious and likely contributing.  We have discussed keeping a blood pressure log in monitoring it and logging it twice a day.  She will bring this to her upcoming appointment.  Previous AKI on CKD Renal function looks to be back within normal limits.  Continue to monitor outpatient  History of DVT/PE Chronically on Eliquis.    Risk Assessment/Risk Scores:   New York Heart Association (NYHA) Functional Class NYHA Class II  CHA2DS2-VASc Score = 4  This indicates a 4.8% annual risk of stroke. The patient's score is based upon: CHF History: 0 HTN History: 1 Diabetes History: 0 Stroke History: 0 Vascular Disease History: 0 Age Score: 2 Gender Score: 1    For questions or updates, please contact [place] Stephens Please consult [email or web address] for contact info under    Signed, Abagail Kitchens, PA-C  03/04/2023 3:02 PM

## 2023-03-04 NOTE — Discharge Instructions (Addendum)
You are seen by cardiologist today.  They recommend stopping Nexium and take Protonix 40 mg instead  You should follow-up with your doctor  Return to ER if you have worse chest pain or shortness of breath

## 2023-03-04 NOTE — Telephone Encounter (Signed)
Patient is in ED. No further action needed at this time.

## 2023-03-04 NOTE — ED Triage Notes (Signed)
Pt BIB [facility] from home d/t chest pressure & exertional SOB she was experiencing since she woke up this morning while getting ready for her cardiologist appointment she was supposed to be at this morning. EMS reports they gave 1 Nitroglycerin & ASA & her s/s resolved & upon arrival to ED they have returned. EKG was good & showed A-Fib ranging from 60-70 bpm, 180/98, A/Ox4, lung sounds clear & a 20g PIV in Lt AC. Pt reports she was admitted 2 weeks ago for her new onset A-fib & it has been controlled since & cardiologist app. was to ask about why she was taken off lasix since her last d/c from hospital since her feet are beginning to swell more than usual.

## 2023-03-04 NOTE — ED Provider Notes (Signed)
[place] EMERGENCY DEPARTMENT AT [facility] Provider Note   CSN: [id number] Arrival date & time: 03/04/23  6063     History  Chief Complaint  Patient presents with   Chest Pressure   Exertional SOB    Brenda Stephens is a 82 y.o. female.  HPI   82 year old female with medical history significant for GERD, HTN, HLD, anxiety, depression, CHF with diastolic dysfunction, PE on Eliquis, atrial fibrillation who presents to the emergency department with chest pain.  Patient was scheduled to follow-up with her cardiologist today but instead presenting to the Emergency Department due to onset of chest pressure.  The patient endorsed exertional shortness of breath that she has been experiencing since she woke up this morning.  She was administered aspirin and nitroglycerin sublingual with EMS and her symptoms improved initially upon arrival to the ER and have subsequently returned.  Per EMS the patient was in A-fib that was rate controlled in the 60s and 70s, BP 180/98, clear lung sounds.  She was recently admitted and discharged for new onset atrial fibrillation with RVR.  She had initially been on Lasix but having taken off the medication and states that her feet have been swelling slightly more than usual.  Home Medications Prior to Admission medications   Medication Sig Start Date End Date Taking? Authorizing Provider  pantoprazole (PROTONIX) 40 MG tablet Take 1 tablet (40 mg total) by mouth daily. 03/04/23  Yes Charlynne Pander, MD  acetaminophen (TYLENOL) 650 MG CR tablet Take 1 tablet (650 mg total) by mouth every 8 (eight) hours as needed for pain (headaches). Patient taking differently: Take 1,300 mg by mouth every 8 (eight) hours as needed for pain (or headaches). 07/05/22   Rodolph Bong, MD  albuterol (VENTOLIN HFA) 108 (90 Base) MCG/ACT inhaler Inhale 2 puffs into the lungs every 6 (six) hours as needed for wheezing or shortness of breath. 08/25/22   Jetty Duhamel D, MD   amLODipine (NORVASC) 10 MG tablet Take 1 tablet (10 mg total) by mouth daily. 02/18/23 02/18/24  Pokhrel, Rebekah Chesterfield, MD  apixaban (ELIQUIS) 5 MG TABS tablet Take 1 tablet (5 mg total) by mouth 2 (two) times daily. 08/13/22   Rhetta Mura, MD  benzonatate (TESSALON) 200 MG capsule TAKE 1 CAPSULE (200 MG TOTAL) BY MOUTH 3 (THREE) TIMES DAILY AS NEEDED FOR COUGH. 01/27/23   Jetty Duhamel D, MD  Biotin 01601 MCG TABS Take 10,000 mcg by mouth daily.    [provider]  cetirizine (ZYRTEC) 10 MG tablet Take 10 mg by mouth at bedtime.    [provider]  donepezil (ARICEPT) 10 MG tablet Take 10 mg by mouth at bedtime. 12/27/21   [provider]  hydrOXYzine (ATARAX) 25 MG tablet Take 1 tablet (25 mg total) by mouth every 6 (six) hours as needed for anxiety. 07/05/22   Rodolph Bong, MD  levothyroxine (SYNTHROID) 25 MCG tablet Take 12.5 mcg by mouth daily before breakfast.    [provider]  meclizine (ANTIVERT) 25 MG tablet Take 1 tablet (25 mg total) by mouth 3 (three) times daily as needed for dizziness. Patient taking differently: Take 25 mg by mouth 3 (three) times daily as needed for dizziness or nausea. 09/20/21   Micheline Maze, MD  memantine (NAMENDA) 10 MG tablet Take 10 mg by mouth 2 (two) times daily. 12/27/21   [provider]  metoprolol succinate (TOPROL XL) 50 MG 24 hr tablet Take 1 tablet (50 mg total) by  mouth at bedtime. 02/17/23 02/17/24  Pokhrel, Rebekah Chesterfield, MD  NEXIUM 24HR 20 MG capsule Take 40 mg by mouth daily before breakfast.    [provider]  Olopatadine HCl (PATADAY OP) Place 1 drop into both eyes daily as needed (for allergies).    [provider]  oxybutynin (DITROPAN) 5 MG tablet Take 5 mg by mouth 2 (two) times daily.    [provider]  Polyethyl Glycol-Propyl Glycol (SYSTANE OP) Place 1 drop into both eyes 2 (two) times daily as needed (for dryness).    [provider]  potassium chloride SA  (KLOR-CON M) 20 MEQ tablet Take 1 tablet (20 mEq total) by mouth daily. 02/17/23   Pokhrel, Rebekah Chesterfield, MD  QUEtiapine (SEROQUEL) 50 MG tablet Take 50 mg by mouth at bedtime. 04/22/22   [provider]  RESTASIS 0.05 % ophthalmic emulsion Place 1 drop into both eyes 2 (two) times daily.    [provider]  sennosides-docusate sodium (SENOKOT-S) 8.6-50 MG tablet Take 1 tablet by mouth daily as needed for constipation.    [provider]  tamsulosin (FLOMAX) 0.4 MG CAPS capsule Take 0.4 mg by mouth at bedtime.    [provider]  venlafaxine XR (EFFEXOR-XR) 150 MG 24 hr capsule TAKE 1 CAPSULE (150 MG TOTAL) BY MOUTH DAILY. Patient taking differently: Take 150 mg by mouth daily. 12/14/12   Pecola Lawless, MD      Allergies    Latex, Sulfa antibiotics, Aspirin, Nsaids, and Oxycodone-acetaminophen    Review of Systems   Review of Systems  All other systems reviewed and are negative.   Physical Exam Updated Vital Signs BP (!) 165/74   Pulse 70   Temp (!) 97.5 F (36.4 C) (Oral)   Resp (!) 22   SpO2 94%  Physical Exam Vitals and nursing note reviewed.  Constitutional:      General: She is not in acute distress.    Appearance: She is well-developed.  HENT:     Head: Normocephalic and atraumatic.  Eyes:     Conjunctiva/sclera: Conjunctivae normal.  Cardiovascular:     Rate and Rhythm: Normal rate and regular rhythm.  Pulmonary:     Effort: Pulmonary effort is normal. No respiratory distress.     Breath sounds: Normal breath sounds.  Abdominal:     Palpations: Abdomen is soft.     Tenderness: There is no abdominal tenderness.  Musculoskeletal:        General: No swelling.     Cervical back: Neck supple.  Skin:    General: Skin is warm and dry.     Capillary Refill: Capillary refill takes less than 2 seconds.  Neurological:     Mental Status: She is alert.  Psychiatric:        Mood and Affect: Mood normal.     ED Results / Procedures /  Treatments   Labs (all labs ordered are listed, but only abnormal results are displayed) Labs Reviewed  BASIC METABOLIC PANEL - Abnormal; Notable for the following components:      Result Value   CO2 20 (*)    Glucose, Bld 110 (*)    All other components within normal limits  CBC - Abnormal; Notable for the following components:   Hemoglobin 11.6 (*)    All other components within normal limits  BRAIN NATRIURETIC PEPTIDE  TROPONIN I (HIGH SENSITIVITY)  TROPONIN I (HIGH SENSITIVITY)    EKG EKG Interpretation Date/Time:  Wednesday March 04 2023 14:37:44 EDT Ventricular  Rate:  76 PR Interval:  174 QRS Duration:  90 QT Interval:  422 QTC Calculation: 475 R Axis:   -1  Text Interpretation: Sinus rhythm Borderline T abnormalities, anterior leads No significant change since last tracing Confirmed by Richardean Canal [phone]) on 03/04/2023 3:13:53 PM  Radiology No results found.  Procedures Procedures    Medications Ordered in ED Medications  acetaminophen (TYLENOL) tablet 1,000 mg (1,000 mg Oral Given 03/04/23 1345)    ED Course/ Medical Decision Making/ A&P                                 Medical Decision Making Amount and/or Complexity of Data Reviewed Labs: ordered. Radiology: ordered.  Risk OTC drugs. Prescription drug management.    82 year old female with medical history significant for GERD, HTN, HLD, anxiety, depression, CHF with diastolic dysfunction, PE on Eliquis, atrial fibrillation who presents to the emergency department with chest pain.  Patient was scheduled to follow-up with her cardiologist today but instead presenting to the Emergency Department due to onset of chest pressure.  The patient endorsed exertional shortness of breath that she has been experiencing since she woke up this morning.  She was administered aspirin and nitroglycerin sublingual with EMS and her symptoms improved initially upon arrival to the ER and have subsequently returned.  Per EMS  the patient was in A-fib that was rate controlled in the 60s and 70s, BP 180/98, clear lung sounds.  She was recently admitted and discharged for new onset atrial fibrillation with RVR.  She had initially been on Lasix but having taken off the medication and states that her feet have been swelling slightly more than usual.   Initial Assessment: With the patient's presentation of left-sided chest pain, most likely diagnosis is musculoskeletal chest pain versus GERD, although ACS remains on the differential. Other diagnoses were considered including (but not limited to) pulmonary embolism, community-acquired pneumonia, aortic dissection, pneumothorax, underlying bony abnormality, anemia. These are considered less likely due to history of present illness and physical exam findings.    Considered ACS. Low suspicion for PE, pt without cough and she is anticoagulated on Eliquis. Aortic Dissection also considered but seems less likely based on the location, quality, onset, and severity of symptoms in this case.  Initial Plan: Evaluate for ACS with delta troponin and EKG evaluated as below  Evaluate for dissection, bony abnormality, or pneumonia with chest x-ray and screening laboratory evaluation including CBC, BMP  Further evaluation for Thoracic Aortic Dissection not indicated at this time based on patient's clinical history and PE findings.   Initial Study Results: EKG was reviewed independently. Rate, rhythm, axis, intervals all examined and without medically relevant abnormality. ST segments without concerns for elevations.    Laboratory  Delta troponin demonstrated normal values.   CBC and BMP without obvious metabolic or inflammatory abnormalities requiring further evaluation.  BNP normal.  Radiology  DG Chest Port 1 View  Result Date: 03/04/2023 CLINICAL DATA:  Chest pain. EXAM: PORTABLE CHEST 1 VIEW COMPARISON:  February 11, 2023. FINDINGS: Stable cardiomediastinal silhouette. Both lungs are  clear. 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 IMPRESSION: No active disease. Electronically Signed   By: Lupita Raider M.D.   On: 03/04/2023 10:59   ECHOCARDIOGRAM COMPLETE  Result Date: 02/12/2023    ECHOCARDIOGRAM REPORT   Patient Name:   BEYONKA HIESTER Date of Exam: 02/12/2023 Medical Rec #:  [id number]  Height:       63.0 in Accession #:    [id number]      Weight:       189.6 lb Date of Birth:  May 17, 1941       BSA:          1.890 m Patient Age:    82 years        BP:           135/74 mmHg Patient Gender: F               HR:           97 bpm. Exam Location:  Inpatient Procedure: 2D Echo, Color Doppler, Cardiac Doppler and Intracardiac            Opacification Agent Indications:    Atrial Flutter  History:        Patient has prior history of Echocardiogram examinations, most                 recent 06/30/2022. Arrythmias:Atrial Flutter,                 Signs/Symptoms:Dyspnea and Fatigue; Risk Factors:Former Smoker                 and Hypothyroidism. Acute pulmonary Embolism.  Sonographer:    Raeford Razor RDCS Referring Phys: [id number] ERIC CHEN  Sonographer Comments: Technically difficult study due to poor echo windows and no subcostal window. Image acquisition challenging due to patient body habitus. IMPRESSIONS  1. Left ventricular ejection fraction, by estimation, is 70 to 75%. The left ventricle has hyperdynamic function. The left ventricle has no regional wall motion abnormalities. Left ventricular diastolic parameters are indeterminate.  2. Right ventricular systolic function is normal. The right ventricular size is normal.  3. Left atrial size was mildly dilated.  4. The mitral valve is normal in structure. No evidence of mitral valve regurgitation. No evidence of mitral stenosis.  5. The aortic valve is tricuspid. There is mild calcification of the aortic valve. Aortic valve regurgitation is not visualized. No aortic stenosis is present.  6. The inferior vena cava is normal in size with greater than  50% respiratory variability, suggesting right atrial pressure of 3 mmHg. Conclusion(s)/Recommendation(s): RV not well seen but appears grossly normal. FINDINGS  Left Ventricle: Left ventricular ejection fraction, by estimation, is 70 to 75%. The left ventricle has hyperdynamic function. The left ventricle has no regional wall motion abnormalities. Definity contrast agent was given IV to delineate the left ventricular endocardial borders. The left ventricular internal cavity size was normal in size. There is no left ventricular hypertrophy. Left ventricular diastolic parameters are indeterminate. Right Ventricle: The right ventricular size is normal. No increase in right ventricular wall thickness. Right ventricular systolic function is normal. Left Atrium: Left atrial size was mildly dilated. Right Atrium: Right atrial size was normal in size. Pericardium: There is no evidence of pericardial effusion. Mitral Valve: The mitral valve is normal in structure. No evidence of mitral valve regurgitation. No evidence of mitral valve stenosis. Tricuspid Valve: The tricuspid valve is normal in structure. Tricuspid valve regurgitation is trivial. No evidence of tricuspid stenosis. Aortic Valve: The aortic valve is tricuspid. There is mild calcification of the aortic valve. Aortic valve regurgitation is not visualized. No aortic stenosis is present. Aortic valve peak gradient measures 7.5 mmHg. Pulmonic Valve: The pulmonic valve was normal in structure. Pulmonic valve regurgitation is trivial. No evidence of pulmonic stenosis. Aorta: The aortic root is normal  in size and structure. Venous: The inferior vena cava was not well visualized. The inferior vena cava is normal in size with greater than 50% respiratory variability, suggesting right atrial pressure of 3 mmHg. IAS/Shunts: No atrial level shunt detected by color flow Doppler.  LEFT VENTRICLE PLAX 2D LVIDd:         4.30 cm LVIDs:         2.70 cm LV PW:         1.10 cm LV  IVS:        1.10 cm LVOT diam:     2.00 cm LV SV:         55 LV SV Index:   29 LVOT Area:     3.14 cm  LV Volumes (MOD) LV vol d, MOD A2C: 53.2 ml LV vol d, MOD A4C: 64.3 ml LV vol s, MOD A2C: 18.6 ml LV vol s, MOD A4C: 22.5 ml LV SV MOD A2C:     34.6 ml LV SV MOD A4C:     64.3 ml LV SV MOD BP:      41.2 ml LEFT ATRIUM           Index LA diam:      4.30 cm 2.27 cm/m LA Vol (A2C): 37.8 ml 20.00 ml/m  AORTIC VALVE AV Area (Vmax): 3.23 cm AV Vmax:        137.00 cm/s AV Peak Grad:   7.5 mmHg LVOT Vmax:      141.00 cm/s LVOT Vmean:     92.100 cm/s LVOT VTI:       0.174 m  AORTA Ao Root diam: 2.80 cm Ao Asc diam:  3.50 cm MITRAL VALVE MV Area (PHT): 4.11 cm    SHUNTS MV Decel Time: 185 msec    Systemic VTI:  0.17 m MV E velocity: 81.73 cm/s  Systemic Diam: 2.00 cm Arvilla Meres MD Electronically signed by Arvilla Meres MD Signature Date/Time: 02/12/2023/2:31:45 PM    Final    VAS [place] LOWER EXTREMITY VENOUS (DVT)  Result Date: 02/12/2023  Lower Venous DVT Study Patient Name:  CLESTINE RISNER  Date of Exam:   02/12/2023 Medical Rec #: [id number]        Accession #:    [id number] Date of Birth: 1941/06/15        Patient Gender: F Patient Age:   14 years Exam Location:  [facility] Procedure:      VAS [place] LOWER EXTREMITY VENOUS (DVT) Referring Phys: ERIC CHEN --------------------------------------------------------------------------------  Indications: Hx of DVT in right popliteal vein (06/30/22), Hx of PE, LLE pain and swelling.  Anticoagulation: Eliquis. Limitations: Body habitus, poor ultrasound/tissue interface and Patient unable to tolerate compressions. Comparison Study: Previous study 06/30/22 positive in right popliteal vein. Performing Technologist: McKayla Maag RVT, [place]  Examination Guidelines: A complete evaluation includes B-mode imaging, spectral Doppler, color Doppler, and power Doppler as needed of all accessible portions of each vessel. Bilateral testing is considered an integral part of a complete  examination. Limited examinations for reoccurring indications may be performed as noted. The reflux portion of the exam is performed with the patient in reverse Trendelenburg.  +---------+---------------+---------+-----------+----------+--------------+ RIGHT    CompressibilityPhasicitySpontaneityPropertiesThrombus Aging +---------+---------------+---------+-----------+----------+--------------+ CFV      Full           Yes      Yes                                 +---------+---------------+---------+-----------+----------+--------------+  SFJ      Full                                                        +---------+---------------+---------+-----------+----------+--------------+ FV Prox  Full                                                        +---------+---------------+---------+-----------+----------+--------------+ FV Mid   Full                                                        +---------+---------------+---------+-----------+----------+--------------+ FV DistalFull                                                        +---------+---------------+---------+-----------+----------+--------------+ PFV      Full                                                        +---------+---------------+---------+-----------+----------+--------------+ POP      Full           Yes      Yes                                 +---------+---------------+---------+-----------+----------+--------------+ PTV      Full                                                        +---------+---------------+---------+-----------+----------+--------------+ PERO                                                  Not visualized +---------+---------------+---------+-----------+----------+--------------+   +--------+---------------+---------+-----------+----------+--------------------+ LEFT    CompressibilityPhasicitySpontaneityPropertiesThrombus Aging        +--------+---------------+---------+-----------+----------+--------------------+ CFV                    Yes      Yes                  Patent by color  doppler              +--------+---------------+---------+-----------+----------+--------------------+ SFJ                    Yes      Yes                  Patent by color                                                           doppler              +--------+---------------+---------+-----------+----------+--------------------+ FV Prox Full                                                              +--------+---------------+---------+-----------+----------+--------------------+ FV Mid                 Yes      Yes                  Patent by color                                                           doppler              +--------+---------------+---------+-----------+----------+--------------------+ FV                                                   Not visualized       Distal                                                                    +--------+---------------+---------+-----------+----------+--------------------+ PFV     Full                                                              +--------+---------------+---------+-----------+----------+--------------------+ POP     Full           Yes      Yes                                       +--------+---------------+---------+-----------+----------+--------------------+ PTV     Full                                                              +--------+---------------+---------+-----------+----------+--------------------+  PERO                                                 Not visualized       +--------+---------------+---------+-----------+----------+--------------------+     Summary: RIGHT: - There is no evidence of deep vein thrombosis in the lower  extremity. However, portions of this examination were limited- see technologist comments above.  - No cystic structure found in the popliteal fossa.  LEFT: - There is no evidence of deep vein thrombosis in the lower extremity. However, portions of this examination were limited- see technologist comments above.  - No cystic structure found in the popliteal fossa.  *See table(s) above for measurements and observations. Electronically signed by Sherald Hess MD on 02/12/2023 at 2:07:23 PM.    Final    DG Chest Portable 1 View  Result Date: 02/11/2023 CLINICAL DATA:  Palpitations EXAM: PORTABLE CHEST 1 VIEW COMPARISON:  07/15/2022 FINDINGS: Bilateral shoulder replacements. No acute airspace disease or effusion. Stable cardiomediastinal silhouette with aortic atherosclerosis. IMPRESSION: No active disease. Electronically Signed   By: Jasmine Pang M.D.   On: 02/11/2023 18:58    Final Assessment and Plan: Plan to consult cardiology given the patient's symptoms of chest pressure and cardiac history. Consultation pending at time of signout. Signout given to Dr. Silverio Lay at 1500.    Final Clinical Impression(s) / ED Diagnoses Final diagnoses:  Chest pain, unspecified type    Rx / DC Orders ED Discharge Orders          Ordered    pantoprazole (PROTONIX) 40 MG tablet  Daily        03/04/23 1603              Ernie Avena, MD 03/07/23 1113

## 2023-03-04 NOTE — Telephone Encounter (Signed)
Patient's daughter is calling stating that patient is being taken to hospital via EMS, but she would like a call back to discuss patients current condition and concern she has.

## 2023-03-04 NOTE — ED Provider Notes (Signed)
  Physical Exam  BP (!) 156/58   Pulse 71   Temp (!) 97.5 F (36.4 C) (Oral)   Resp (!) 21   SpO2 96%   Physical Exam  Procedures  Procedures  ED Course / MDM    Medical Decision Making Care assumed at 3 pm.  Patient is here with chest pain and has 2 negative troponin.  Signed out pending cardiology consult.  4:02 PM Cardiology saw patient and thought it was GI in nature.  Recommend Protonix 40 mg daily.  Stable for discharge.  Problems Addressed: Chest pain, unspecified type: acute illness or injury  Amount and/or Complexity of Data Reviewed Labs: ordered. Decision-making details documented in ED Course. Radiology: ordered.  Risk OTC drugs. Prescription drug management.          Charlynne Pander, MD 03/04/23 [phone]

## 2023-04-04 NOTE — Progress Notes (Deleted)
Subjective:    Patient ID: Brenda Stephens, female    DOB: 08-13-1940, 82 y.o.   MRN: [id number]  HPI F former smoker followed for allergic rhinitis, Bronchitis, complicated by HBP, GERD, Anemia, R breast cancer Walk Test 10/12/19- desat to 89% with max HR 95. Mostly sats around 94-96. 03/28/21- 306 meters, average pace, stopping x3 for SOB, lowest O2 sat 95%, max HR 103. Cardiac cath 08/02/19- normal PFT10/26/22- she was again unable to follow directions- when to inhale and when to blow out- tech unable to complete test.  Hgb 12.7 9/13 [facility] ------------------------------------------------------------------------------------   08/25/22- 82 yoF former smoker (3 pk yrs) followed for allergic Rhinitis, acute Bronchitis,  Lung Nodule, complicated by HBP, Gr1DD,  GERD, Anemia, R breast cancer, Alzheimers, DVT/PE/Eliquis,  -albuterol hfa, Flonase,  Covid vax-5 Phizer Flu vax-had Hosp 1/16-1/18- after RDVT/ bilat PE.  Follow-up manage by her PCP. New paresthesias left leg.  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 Albuterol inhaler helps, used occasionally as needed.  No significant wheeze most days. CTaPE 06/30/22- 1. Positive for acute PE with CT evidence of right heart strain (RV/LV Ratio = 1.31 ) consistent with at least submassive (intermediate risk) PE. The presence of right heart strain has been associated with an increased risk of morbidity and mortality. Please refer to the "PE Focused" order set in [facility]. 2. Aortic and coronary artery atherosclerosis. 3. Trace pleural effusions. 4. Hepatic steatosis. 5. Results phoned to Dr. Eudelia Bunch in the ED at 3:03 a.m., 06/30/2022, with verbal acknowledgement of the key findings. Aortic Atherosclerosis (ICD10-I70.0).  04/06/23- 23 yoF former smoker (3 pk yrs) followed for allergic Rhinitis, acute Bronchitis,  Lung Nodule, complicated by HBP, Gr1DD,  GERD, Anemia, R breast cancer, Alzheimers, DVT/PE/Eliquis, AFib,  -albuterol  hfa, Flonase,  ED in Sept for chest pain evl by cardiology and felt to be GI in origin.  CXR 03/04/23-  IMPRESSION: No active disease. ROS-see HPI   + = positive Constitutional:   No-   weight loss, night sweats, fevers, chills, fatigue, lassitude. HEENT:   + headaches, difficulty swallowing, tooth/dental problems, sore throat,       No-  sneezing, itching, ear ache, nasal congestion, +post nasal drip,  CV:  No-   chest pain, orthopnea, PND, swelling in lower extremities, anasarca,  dizziness, palpitations Resp: + shortness of breath with exertion or at rest.               +productive cough,  No non-productive cough,  No- coughing up of blood.   +   change in color of mucus.  No- wheezing.   Skin: No-   rash or lesions. GI:  No-   heartburn, indigestion, abdominal pain, nausea, vomiting, GU:  MS:  No-   joint pain or swelling.  Neuro-     nothing unusual Psych:  No- change in mood or affect. No depression or anxiety.  No memory loss.    Objective:  OBJ- Physical Exam General- Alert, Oriented, Affect-appropriate, Distress- none acute, + overweight Skin- rash-none, lesions- none, excoriation- none Lymphadenopathy- none Head- atraumatic            Eyes- Gross vision intact, PERRLA, conjunctivae and secretions clear            Ears- Hearing ok,             Nose-, no-Septal dev,  polyps, erosion, perforation             Throat- Mallampati III ,  mucosa clear , drainage- none, tonsils- atrophic Neck- flexible , trachea midline, no stridor , thyroid nl, carotid no bruit Chest - symmetrical excursion , unlabored           Heart/CV- RRR , no murmur , no gallop  , no rub, nl s1 s2                           - JVD- none , edema-none, stasis changes- none, varices- none           Lung- + fine rales, unlabored, wheeze- none, cough none, dullness-none, rub- none           Chest wall-  Abd- Br/ Gen/ Rectal- Not done, not indicated Extrem- cyanosis- none, clubbing, none, atrophy- none, strength-  nl.  Neuro- +slight head bob Assessment & Plan:

## 2023-04-06 ENCOUNTER — Encounter: Payer: Self-pay | Admitting: Internal Medicine

## 2023-04-06 ENCOUNTER — Ambulatory Visit: Payer: MEDICARE | Admitting: Internal Medicine

## 2023-04-20 ENCOUNTER — Ambulatory Visit: Payer: MEDICARE | Admitting: Physician Assistant

## 2023-04-27 ENCOUNTER — Ambulatory Visit: Payer: MEDICARE | Attending: Adult Health | Admitting: Physician Assistant

## 2023-04-27 ENCOUNTER — Encounter: Payer: Self-pay | Admitting: Physician Assistant

## 2023-04-27 VITALS — BP 120/70 | HR 65 | Ht 63.0 in | Wt 191.0 lb

## 2023-04-27 DIAGNOSIS — I2699 Other pulmonary embolism without acute cor pulmonale: Secondary | ICD-10-CM

## 2023-04-27 DIAGNOSIS — R7989 Other specified abnormal findings of blood chemistry: Secondary | ICD-10-CM | POA: Diagnosis present

## 2023-04-27 DIAGNOSIS — I480 Paroxysmal atrial fibrillation: Secondary | ICD-10-CM

## 2023-04-27 DIAGNOSIS — Z79899 Other long term (current) drug therapy: Secondary | ICD-10-CM

## 2023-04-27 DIAGNOSIS — R079 Chest pain, unspecified: Secondary | ICD-10-CM

## 2023-04-27 MED ORDER — PANTOPRAZOLE SODIUM 40 MG PO TBEC: 40.0000 mg | DELAYED_RELEASE_TABLET | Freq: Every day | ORAL | 3 refills | Status: DC

## 2023-04-27 MED ORDER — POTASSIUM CHLORIDE CRYS ER 20 MEQ PO TBCR: 20.0000 meq | EXTENDED_RELEASE_TABLET | Freq: Every day | ORAL | 3 refills | Status: DC

## 2023-04-27 NOTE — Progress Notes (Signed)
Cardiology Office Note:  .   Date:  04/27/2023  ID:  Brenda Stephens, DOB 07/10/1940, MRN [id number] PCP: Genevie Ann  [facility] Providers Cardiologist:  Rollene Rotunda, MD     History of Present Illness: .   Brenda Stephens is a 82 y.o. 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  Myoview in July 2019 showed EF 70%, fixed small defect of mild severity in the apical anterior wall likely due to breast attenuation artifact, overall low risk study.  Cardiac catheterization performed on 08/02/2019 showed normal coronary arteries, normal LVEDP, EF 55 to 65%.  Heart monitor in August 2022 showed normal sinus rhythm, infrequent ventricular ectopy and SVT, symptom of chest discomfort and fluttering sensation did not correlate with any significant arrhythmia.  Previous echocardiogram in January 2024 showed EF 60 to 65%, no regional wall motion abnormality, grade 1 DD, moderately reduced RV systolic function.  Venous Doppler consistent with acute DVT in the right popliteal vein.  CTA of the chest showed acute PE with evidence of right heart strain consistent with at least submassive PE, trace pleural effusion, aortic and coronary artery atherosclerosis.  Patient was seen by Joni Reining, NP on 02/11/2023 and was diagnosed with new atrial fibrillation with RVR.  She was only partially compliant with her Eliquis.  She was subsequently admitted to the hospital for A-fib with RVR.  Initially she was placed on Cardizem drip.  TSH was elevated at 7.96.  She received IV diuresis with mild AKI.  Lasix held on discharge.  Repeat echocardiogram obtained 03/15/2023 showed EF 70 to 75%, normal RV, no evidence of significant valve issue.  TEE cardioversion was initially planned, however she self converted back to sinus  rhythm on rate control therapy.  Patient returned back to the ED on 03/04/2023 for chest pain.  Symptoms improved with the inhaler and was associated with food.  EKG was nonischemic.  Given reassuring cardiac catheterization in 2021, this was felt to be noncardiac in nature.  Her Nexium was discontinued and she was switched to Protonix 40 mg daily.  EKG showed no recurrence of A-fib.  Patient presents today for follow-up.  She continued to have some atypical chest pain that is worse with food but not with physical activity.  She has burning sensation in her foot and night but not with ambulation either, this is consistent with neuropathy.  Family has continued to give her Lasix 20 mg daily even though this was taken off during the recent hospitalization.  She has not been getting potassium as she ran out of the prescription.  We will obtain a basic metabolic panel today to make sure her renal function is stable.  If renal function is down we may have to discontinue Lasix.  She continued to have diarrhea as well.  She has upcoming follow-up with PCP, I will defer to PCP to monitor her thyroid function.  She is maintaining sinus rhythm.  Overall, she has been doing well from the cardiac perspective and can follow-up with Dr. Antoine Poche in 3 to 4 months.  ROS:   Patient continued to having some intermittent chest pain, shortness of breath, she has no lower extremity edema, orthopnea or PND.  Studies Reviewed: .        Cardiac Studies & Procedures   CARDIAC CATHETERIZATION  CARDIAC  Cardiology Office Note:  .   Date:  04/27/2023  ID:  Brenda Stephens, DOB 07/10/1940, MRN [id number] PCP: Genevie Ann  [facility] Providers Cardiologist:  Rollene Rotunda, MD     History of Present Illness: .   Brenda Stephens is a 82 y.o. 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  Myoview in July 2019 showed EF 70%, fixed small defect of mild severity in the apical anterior wall likely due to breast attenuation artifact, overall low risk study.  Cardiac catheterization performed on 08/02/2019 showed normal coronary arteries, normal LVEDP, EF 55 to 65%.  Heart monitor in August 2022 showed normal sinus rhythm, infrequent ventricular ectopy and SVT, symptom of chest discomfort and fluttering sensation did not correlate with any significant arrhythmia.  Previous echocardiogram in January 2024 showed EF 60 to 65%, no regional wall motion abnormality, grade 1 DD, moderately reduced RV systolic function.  Venous Doppler consistent with acute DVT in the right popliteal vein.  CTA of the chest showed acute PE with evidence of right heart strain consistent with at least submassive PE, trace pleural effusion, aortic and coronary artery atherosclerosis.  Patient was seen by Joni Reining, NP on 02/11/2023 and was diagnosed with new atrial fibrillation with RVR.  She was only partially compliant with her Eliquis.  She was subsequently admitted to the hospital for A-fib with RVR.  Initially she was placed on Cardizem drip.  TSH was elevated at 7.96.  She received IV diuresis with mild AKI.  Lasix held on discharge.  Repeat echocardiogram obtained 03/15/2023 showed EF 70 to 75%, normal RV, no evidence of significant valve issue.  TEE cardioversion was initially planned, however she self converted back to sinus  rhythm on rate control therapy.  Patient returned back to the ED on 03/04/2023 for chest pain.  Symptoms improved with the inhaler and was associated with food.  EKG was nonischemic.  Given reassuring cardiac catheterization in 2021, this was felt to be noncardiac in nature.  Her Nexium was discontinued and she was switched to Protonix 40 mg daily.  EKG showed no recurrence of A-fib.  Patient presents today for follow-up.  She continued to have some atypical chest pain that is worse with food but not with physical activity.  She has burning sensation in her foot and night but not with ambulation either, this is consistent with neuropathy.  Family has continued to give her Lasix 20 mg daily even though this was taken off during the recent hospitalization.  She has not been getting potassium as she ran out of the prescription.  We will obtain a basic metabolic panel today to make sure her renal function is stable.  If renal function is down we may have to discontinue Lasix.  She continued to have diarrhea as well.  She has upcoming follow-up with PCP, I will defer to PCP to monitor her thyroid function.  She is maintaining sinus rhythm.  Overall, she has been doing well from the cardiac perspective and can follow-up with Dr. Antoine Poche in 3 to 4 months.  ROS:   Patient continued to having some intermittent chest pain, shortness of breath, she has no lower extremity edema, orthopnea or PND.  Studies Reviewed: .        Cardiac Studies & Procedures   CARDIAC CATHETERIZATION  CARDIAC  CATHETERIZATION 08/02/2019  Narrative  The left ventricular systolic function is normal.  LV end diastolic pressure is normal.  The left ventricular ejection fraction is 55-65% by visual estimate.  1. Normal coronary anatomy 2. Normal LV function 3. Normal LVEDP  Plan: medical management.  Findings Coronary Findings Diagnostic  Dominance: Right  Left Main Vessel was injected. Vessel is normal in caliber. Vessel is  angiographically normal.  Left Anterior Descending Vessel was injected. Vessel is normal in caliber. Vessel is angiographically normal.  Left Circumflex Vessel was injected. Vessel is normal in caliber. Vessel is angiographically normal.  Right Coronary Artery Vessel was injected. Vessel is normal in caliber. Vessel is angiographically normal.  Intervention  No interventions have been documented.   STRESS TESTS  MYOCARDIAL PERFUSION IMAGING 12/29/2017  Narrative  The left ventricular ejection fraction is hyperdynamic (>65%).  Nuclear stress EF: 70%.  No T wave inversion was noted during stress.  There was no ST segment deviation noted during stress.  Defect 1: There is a small defect of mild severity.  This is a low risk study.  Small size, mild intensity fixed apical anterior perfusion defect, likely breast attenuation artifact. No reversible ischemia. LVEF 70% with normal wall motion. This is a low risk study.   ECHOCARDIOGRAM  ECHOCARDIOGRAM COMPLETE 02/12/2023  Narrative ECHOCARDIOGRAM REPORT    Patient Name:   Brenda Stephens Date of Exam: 02/12/2023 Medical Rec #:  [id number]       Height:       63.0 in Accession #:    [id number]      Weight:       189.6 lb Date of Birth:  06/12/1941       BSA:          1.890 m Patient Age:    82 years        BP:           135/74 mmHg Patient Gender: F               HR:           97 bpm. Exam Location:  Inpatient  Procedure: 2D Echo, Color Doppler, Cardiac Doppler and Intracardiac Opacification Agent  Indications:    Atrial Flutter  History:        Patient has prior history of Echocardiogram examinations, most recent 06/30/2022. Arrythmias:Atrial Flutter, Signs/Symptoms:Dyspnea and Fatigue; Risk Factors:Former Smoker and Hypothyroidism. Acute pulmonary Embolism.  Sonographer:    Raeford Razor RDCS Referring Phys: [phone] ERIC CHEN   Sonographer Comments: Technically difficult study due to poor echo windows and no subcostal  window. Image acquisition challenging due to patient body habitus. IMPRESSIONS   1. Left ventricular ejection fraction, by estimation, is 70 to 75%. The left ventricle has hyperdynamic function. The left ventricle has no regional wall motion abnormalities. Left ventricular diastolic parameters are indeterminate. 2. Right ventricular systolic function is normal. The right ventricular size is normal. 3. Left atrial size was mildly dilated. 4. The mitral valve is normal in structure. No evidence of mitral valve regurgitation. No evidence of mitral stenosis. 5. The aortic valve is tricuspid. There is mild calcification of the aortic valve. Aortic valve regurgitation is not visualized. No aortic stenosis is present. 6. The inferior vena cava is normal in size with greater than 50% respiratory variability, suggesting right atrial pressure of 3 mmHg.  Conclusion(s)/Recommendation(s): RV not well seen but appears grossly normal.  FINDINGS Left Ventricle: Left ventricular ejection fraction, by estimation, is 70 to 75%. The  Cardiology Office Note:  .   Date:  04/27/2023  ID:  Brenda Stephens, DOB 07/10/1940, MRN [id number] PCP: Genevie Ann  [facility] Providers Cardiologist:  Rollene Rotunda, MD     History of Present Illness: .   Brenda Stephens is a 82 y.o. 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  Myoview in July 2019 showed EF 70%, fixed small defect of mild severity in the apical anterior wall likely due to breast attenuation artifact, overall low risk study.  Cardiac catheterization performed on 08/02/2019 showed normal coronary arteries, normal LVEDP, EF 55 to 65%.  Heart monitor in August 2022 showed normal sinus rhythm, infrequent ventricular ectopy and SVT, symptom of chest discomfort and fluttering sensation did not correlate with any significant arrhythmia.  Previous echocardiogram in January 2024 showed EF 60 to 65%, no regional wall motion abnormality, grade 1 DD, moderately reduced RV systolic function.  Venous Doppler consistent with acute DVT in the right popliteal vein.  CTA of the chest showed acute PE with evidence of right heart strain consistent with at least submassive PE, trace pleural effusion, aortic and coronary artery atherosclerosis.  Patient was seen by Joni Reining, NP on 02/11/2023 and was diagnosed with new atrial fibrillation with RVR.  She was only partially compliant with her Eliquis.  She was subsequently admitted to the hospital for A-fib with RVR.  Initially she was placed on Cardizem drip.  TSH was elevated at 7.96.  She received IV diuresis with mild AKI.  Lasix held on discharge.  Repeat echocardiogram obtained 03/15/2023 showed EF 70 to 75%, normal RV, no evidence of significant valve issue.  TEE cardioversion was initially planned, however she self converted back to sinus  rhythm on rate control therapy.  Patient returned back to the ED on 03/04/2023 for chest pain.  Symptoms improved with the inhaler and was associated with food.  EKG was nonischemic.  Given reassuring cardiac catheterization in 2021, this was felt to be noncardiac in nature.  Her Nexium was discontinued and she was switched to Protonix 40 mg daily.  EKG showed no recurrence of A-fib.  Patient presents today for follow-up.  She continued to have some atypical chest pain that is worse with food but not with physical activity.  She has burning sensation in her foot and night but not with ambulation either, this is consistent with neuropathy.  Family has continued to give her Lasix 20 mg daily even though this was taken off during the recent hospitalization.  She has not been getting potassium as she ran out of the prescription.  We will obtain a basic metabolic panel today to make sure her renal function is stable.  If renal function is down we may have to discontinue Lasix.  She continued to have diarrhea as well.  She has upcoming follow-up with PCP, I will defer to PCP to monitor her thyroid function.  She is maintaining sinus rhythm.  Overall, she has been doing well from the cardiac perspective and can follow-up with Dr. Antoine Poche in 3 to 4 months.  ROS:   Patient continued to having some intermittent chest pain, shortness of breath, she has no lower extremity edema, orthopnea or PND.  Studies Reviewed: .        Cardiac Studies & Procedures   CARDIAC CATHETERIZATION  CARDIAC

## 2023-04-27 NOTE — Patient Instructions (Signed)
Medication Instructions:  NO CHANGES *If you need a refill on your cardiac medications before your next appointment, please call your pharmacy*   Lab Work: BMP TODAY If you have labs (blood work) drawn today and your tests are completely normal, you will receive your results only by: MyChart Message (if you have MyChart) OR A paper copy in the mail If you have any lab test that is abnormal or we need to change your treatment, we will call you to review the results.   Testing/Procedures: NO TESTING   Follow-Up: At [facility], you and your health needs are our priority.  As part of our continuing mission to provide you with exceptional heart care, we have created designated Provider Care Teams.  These Care Teams include your primary Cardiologist (physician) and Advanced Practice Providers (APPs -  Physician Assistants and Nurse Practitioners) who all work together to provide you with the care you need, when you need it.   Your next appointment:   3-4 month(s)  Provider:   Rollene Rotunda, MD

## 2023-04-28 LAB — BASIC METABOLIC PANEL
BUN/Creatinine Ratio: 16 (ref 12–28)
BUN: 17 mg/dL (ref 8–27)
CO2: 23 mmol/L (ref 20–29)
Calcium: 9.7 mg/dL (ref 8.7–10.3)
Chloride: 99 mmol/L (ref 96–106)
Creatinine, Ser: 1.07 mg/dL — ABNORMAL HIGH (ref 0.57–1.00)
Glucose: 119 mg/dL — ABNORMAL HIGH (ref 70–99)
Potassium: 4.2 mmol/L (ref 3.5–5.2)
Sodium: 139 mmol/L (ref 134–144)
eGFR: 52 mL/min/{1.73_m2} — ABNORMAL LOW (ref >59)

## 2023-06-09 ENCOUNTER — Other Ambulatory Visit: Payer: Self-pay | Admitting: Internal Medicine

## 2023-06-09 ENCOUNTER — Other Ambulatory Visit: Payer: Self-pay | Admitting: Cardiology

## 2023-06-09 DIAGNOSIS — R0602 Shortness of breath: Secondary | ICD-10-CM

## 2023-07-09 ENCOUNTER — Encounter (HOSPITAL_COMMUNITY): Payer: Self-pay | Admitting: Internal Medicine

## 2023-07-09 ENCOUNTER — Other Ambulatory Visit: Payer: Self-pay

## 2023-07-09 ENCOUNTER — Observation Stay (HOSPITAL_BASED_OUTPATIENT_CLINIC_OR_DEPARTMENT_OTHER)
Admission: EM | Admit: 2023-07-09 | Discharge: 2023-07-10 | Disposition: A | Discharge status: Home Health | Payer: MEDICARE | Attending: Internal Medicine | Admitting: Internal Medicine

## 2023-07-09 DIAGNOSIS — Z853 Personal history of malignant neoplasm of breast: Secondary | ICD-10-CM | POA: Diagnosis not present

## 2023-07-09 DIAGNOSIS — Z86718 Personal history of other venous thrombosis and embolism: Secondary | ICD-10-CM | POA: Diagnosis not present

## 2023-07-09 DIAGNOSIS — Z96611 Presence of right artificial shoulder joint: Secondary | ICD-10-CM | POA: Insufficient documentation

## 2023-07-09 DIAGNOSIS — Z86711 Personal history of pulmonary embolism: Secondary | ICD-10-CM | POA: Insufficient documentation

## 2023-07-09 DIAGNOSIS — Z6833 Body mass index (BMI) 33.0-33.9, adult: Secondary | ICD-10-CM | POA: Insufficient documentation

## 2023-07-09 DIAGNOSIS — Z79899 Other long term (current) drug therapy: Secondary | ICD-10-CM | POA: Insufficient documentation

## 2023-07-09 DIAGNOSIS — N179 Acute kidney failure, unspecified: Secondary | ICD-10-CM | POA: Insufficient documentation

## 2023-07-09 DIAGNOSIS — Z87891 Personal history of nicotine dependence: Secondary | ICD-10-CM | POA: Insufficient documentation

## 2023-07-09 DIAGNOSIS — Z91040 Latex allergy status: Secondary | ICD-10-CM | POA: Insufficient documentation

## 2023-07-09 DIAGNOSIS — K922 Gastrointestinal hemorrhage, unspecified: Secondary | ICD-10-CM | POA: Diagnosis not present

## 2023-07-09 DIAGNOSIS — E669 Obesity, unspecified: Secondary | ICD-10-CM | POA: Diagnosis not present

## 2023-07-09 DIAGNOSIS — D649 Anemia, unspecified: Secondary | ICD-10-CM | POA: Diagnosis not present

## 2023-07-09 DIAGNOSIS — F0390 Unspecified dementia without behavioral disturbance: Secondary | ICD-10-CM | POA: Diagnosis not present

## 2023-07-09 DIAGNOSIS — Z96612 Presence of left artificial shoulder joint: Secondary | ICD-10-CM | POA: Insufficient documentation

## 2023-07-09 DIAGNOSIS — Z7901 Long term (current) use of anticoagulants: Secondary | ICD-10-CM

## 2023-07-09 DIAGNOSIS — R251 Tremor, unspecified: Secondary | ICD-10-CM | POA: Diagnosis present

## 2023-07-09 DIAGNOSIS — E039 Hypothyroidism, unspecified: Secondary | ICD-10-CM | POA: Insufficient documentation

## 2023-07-09 DIAGNOSIS — I951 Orthostatic hypotension: Secondary | ICD-10-CM | POA: Diagnosis not present

## 2023-07-09 DIAGNOSIS — I4891 Unspecified atrial fibrillation: Secondary | ICD-10-CM | POA: Diagnosis not present

## 2023-07-09 DIAGNOSIS — K625 Hemorrhage of anus and rectum: Secondary | ICD-10-CM | POA: Diagnosis not present

## 2023-07-09 LAB — CBC WITH DIFFERENTIAL/PLATELET
Abs Immature Granulocytes: 0.03 10*3/uL (ref 0.00–0.07)
Basophils Absolute: 0.1 10*3/uL (ref 0.0–0.1)
Basophils Relative: 1 %
Eosinophils Absolute: 0.1 10*3/uL (ref 0.0–0.5)
Eosinophils Relative: 2 %
HCT: 34.4 % — ABNORMAL LOW (ref 36.0–46.0)
Hemoglobin: 11 g/dL — ABNORMAL LOW (ref 12.0–15.0)
Immature Granulocytes: 0 %
Lymphocytes Relative: 17 %
Lymphs Abs: 1.2 10*3/uL (ref 0.7–4.0)
MCH: 26.9 pg (ref 26.0–34.0)
MCHC: 32 g/dL (ref 30.0–36.0)
MCV: 84.1 fL (ref 80.0–100.0)
Monocytes Absolute: 0.8 10*3/uL (ref 0.1–1.0)
Monocytes Relative: 11 %
Neutro Abs: 5.1 10*3/uL (ref 1.7–7.7)
Neutrophils Relative %: 69 %
Platelets: 314 10*3/uL (ref 150–400)
RBC: 4.09 MIL/uL (ref 3.87–5.11)
RDW: 13.2 % (ref 11.5–15.5)
WBC: 7.3 10*3/uL (ref 4.0–10.5)
nRBC: 0 % (ref 0.0–0.2)

## 2023-07-09 LAB — COMPREHENSIVE METABOLIC PANEL
ALT: 11 U/L (ref 0–44)
AST: 17 U/L (ref 15–41)
Albumin: 4 g/dL (ref 3.5–5.0)
Alkaline Phosphatase: 106 U/L (ref 38–126)
Anion gap: 12 (ref 5–15)
BUN: 14 mg/dL (ref 8–23)
CO2: 24 mmol/L (ref 22–32)
Calcium: 9.5 mg/dL (ref 8.9–10.3)
Chloride: 101 mmol/L (ref 98–111)
Creatinine, Ser: 1.25 mg/dL — ABNORMAL HIGH (ref 0.44–1.00)
GFR, Estimated: 43 mL/min — ABNORMAL LOW (ref >60)
Glucose, Bld: 118 mg/dL — ABNORMAL HIGH (ref 70–99)
Potassium: 4 mmol/L (ref 3.5–5.1)
Sodium: 137 mmol/L (ref 135–145)
Total Bilirubin: 0.5 mg/dL (ref 0.0–1.2)
Total Protein: 7.2 g/dL (ref 6.5–8.1)

## 2023-07-09 LAB — OCCULT BLOOD X 1 CARD TO LAB, STOOL: Fecal Occult Bld: POSITIVE — AB

## 2023-07-09 MED ORDER — SORBITOL 70 % SOLN: 30.0000 mL | Freq: Every day | Status: DC | PRN

## 2023-07-09 MED ORDER — ACETAMINOPHEN 325 MG PO TABS: 650.0000 mg | ORAL_TABLET | Freq: Four times a day (QID) | ORAL | Status: DC | PRN

## 2023-07-09 MED ORDER — SODIUM CHLORIDE 0.9 % IV BOLUS
500.0000 mL | Freq: Once | INTRAVENOUS | Status: AC
  Administered 2023-07-09: 500 mL via INTRAVENOUS

## 2023-07-09 MED ORDER — ACETAMINOPHEN 650 MG RE SUPP: 650.0000 mg | Freq: Four times a day (QID) | RECTAL | Status: DC | PRN

## 2023-07-09 MED ORDER — ONDANSETRON HCL 4 MG/2ML IJ SOLN
4.0000 mg | Freq: Four times a day (QID) | INTRAMUSCULAR | Status: DC | PRN
  Administered 2023-07-10: 4 mg via INTRAVENOUS
  Filled 2023-07-09: qty 2

## 2023-07-09 MED ORDER — ONDANSETRON HCL 4 MG PO TABS: 4.0000 mg | ORAL_TABLET | Freq: Four times a day (QID) | ORAL | Status: DC | PRN

## 2023-07-09 NOTE — H&P (Signed)
 History and Physical    Patient: Brenda Stephens FMW:[id number] DOB: 1941-04-12 DOA: 07/09/2023 DOS: the patient was seen and examined on 07/09/2023 PCP: Loris Elsie PARAS, PA-C  Patient coming from: Home  Chief Complaint:  Chief Complaint  Patient presents with   Tremors   HPI: Brenda Stephens is a 83 y.o. female with medical history significant for mild dementia, history of atrial fibrillation on Eliquis , history of PE, hypothyroidism, and hypertension who was brought in by her daughter after an episode of nearly passing out.  My history comes from the emergency department notes as the patient dated different story.  Apparently the patient was going out to lunch.  Her daughter was driving the car and when the patient went to get out of the car she became extremely lightheaded and weak.  She did not fall or hit the ground the daughter was able to get her back into the car and that is when she brought her to the emergency department.  The patient told me that she a rough day yesterday she was shaky with tremors all day long.  She also reports that yesterday she saw blood in her stool in the morning time.  She reports that she has abdominal pain after eating and that her stomach aches all the time.  She periodically does see blood in her stool.  The patient did not mention the episode of lightheadedness earlier today. Emergency department department evaluation revealed a pool of blood inside the rectum.  Hemoglobin was 11.0 today and was 11.6 four months ago.  Follows with [facility] regularly because of her chronic pain with eating and abdominal pain.  They were called to the emergency department and the hospitalist will be admitting the patient for GI bleed.   Review of Systems: unable to review all systems due to the inability of the patient to answer questions. Past Medical History:  Diagnosis Date   Acute deep vein thrombosis (DVT) of proximal vein of right lower extremity (HCC) 07/02/2022   Acute  pulmonary embolism (HCC) 07/15/2022   Allergy    latex   Anemia    Anxiety    Arthritis    Bilateral pulmonary embolism (HCC) 06/30/2022   Cancer (HCC) 01/2017   right breast cancer   Depression    Dyslipidemia    Eustachian tube dysfunction    GERD (gastroesophageal reflux disease)    Grade I diastolic dysfunction 07/15/2022   Hx of cardiovascular stress test    Myoview  10/13:  apical thinning, EF 72%, no ischemia   Hx of colonic polyps    Dr Rosalie   Hypertension    Hypothyroidism    Other abnormal glucose    Pulmonary embolism (HCC) 06/30/2022   Past Surgical History:  Procedure Laterality Date   ABDOMINAL HYSTERECTOMY     BSO for uterine fibroids   ACHILLES TENDON SURGERY Right 10/07/2012   Procedure: RIGHT REPAIR RUPTURE TENDON PRIMARY OPEN/PERCUTANEOUS ;  Surgeon: Toribio JULIANNA Chancy, MD;  Location: [facility];  Service: Orthopedics;  Laterality: Right;   APPENDECTOMY     BREAST LUMPECTOMY WITH RADIOACTIVE SEED AND SENTINEL LYMPH NODE BIOPSY Right 03/16/2017   Procedure: RIGHT BREAST LUMPECTOMY WITH RADIOACTIVE SEED AND RIGHT AXILLARY SENTINEL  NODE BIOPSY ERAS PATHWAY;  Surgeon: Ebbie Cough, MD;  Location: [facility];  Service: General;  Laterality: Right;  PECTORAL BLOCK   CATARACT EXTRACTION, BILATERAL     CHOLECYSTECTOMY     COLONOSCOPY W/ POLYPECTOMY  Dr Rosalie   EAR CYST EXCISION Right 10/07/2012   Procedure: 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 ;  Surgeon: Toribio JULIANNA Chancy, MD;  Location: [facility];  Service: Orthopedics;  Laterality: Right;   EXPLORATORY LAPAROTOMY     LEFT HEART CATH AND CORONARY ANGIOGRAPHY N/A 08/02/2019   Procedure: LEFT HEART CATH AND CORONARY ANGIOGRAPHY;  Surgeon: Jordan, Peter M, MD;  Location: [facility];  Service: Cardiovascular;  Laterality: N/A;   REVISION TOTAL SHOULDER TO REVERSE TOTAL SHOULDER Left 01/12/2019   Procedure: REVISION TOTAL SHOULDER TO REVERSE TOTAL  SHOULDER;  Surgeon: Cristy Bonner DASEN, MD;  Location: [facility];  Service: Orthopedics;  Laterality: Left;   SINUS SURGERY WITH INSTATRAK     TOTAL SHOULDER REPLACEMENT     Right shoulder   TOTAL SHOULDER REPLACEMENT  05/2010   Left   TUBAL LIGATION     Social History:  reports that she quit smoking about 61 years ago. Her smoking use included cigarettes. She started smoking about 67 years ago. She has a 3 pack-year smoking history. She has never been exposed to tobacco smoke. She has never used smokeless tobacco. She reports current alcohol  use of about 3.0 standard drinks of alcohol  per week. She reports that she does not use drugs.  Allergies  Allergen Reactions   Latex Rash   Sulfa  Antibiotics Anaphylaxis and Nausea Only   Aspirin  Other (See Comments)    GI bleeding   Nsaids Other (See Comments)    GI upset    Oxycodone -Acetaminophen  Nausea Only    Family History  Problem Relation Age of Onset   Hiatal hernia Mother    Heart attack Mother 72   Heart disease Father 46       CAD @ autopsy   Cancer Father        renal cancer   Heart attack Brother 57       fatal   Diabetes Neg Hx    Stroke Neg Hx     Prior to Admission medications   Medication Sig Start Date End Date Taking? Authorizing Provider  acetaminophen  (TYLENOL ) 650 MG CR tablet Take 1 tablet (650 mg total) by mouth every 8 (eight) hours as needed for pain (headaches). Patient taking differently: Take 1,300 mg by mouth every 8 (eight) hours as needed for pain (or headaches). 07/05/22  Yes Sebastian Toribio GAILS, MD  albuterol  (VENTOLIN  HFA) 108 [phone] Base) MCG/ACT inhaler Inhale 2 puffs into the lungs every 6 (six) hours as needed for wheezing or shortness of breath. 08/25/22  Yes Young, Reggy D, MD  apixaban  (ELIQUIS ) 5 MG TABS tablet Take 1 tablet (5 mg total) by mouth 2 (two) times daily. 08/13/22  Yes Samtani, Jai-Gurmukh, MD  benzonatate  (TESSALON ) 200 MG capsule TAKE 1 CAPSULE (200 MG TOTAL) BY MOUTH 3 (THREE) TIMES DAILY AS  NEEDED FOR COUGH. 06/15/23  Yes Young, Clinton D, MD  Biotin  10000 MCG TABS Take 10,000 mcg by mouth daily.   Yes [provider]  cetirizine (ZYRTEC) 10 MG tablet Take 10 mg by mouth at bedtime.   Yes [provider]  dicyclomine  (BENTYL ) 20 MG tablet Take 20 mg by mouth every 6 (six) hours. 03/30/23  Yes [provider]  donepezil  (ARICEPT ) 10 MG tablet Take 10 mg by mouth at bedtime. 12/27/21  Yes [provider]  DULoxetine  (CYMBALTA ) 60 MG capsule Take 120 mg by mouth daily. 06/10/23  Yes [provider]  furosemide  (LASIX ) 20 MG tablet  TAKE 1 TABLET BY MOUTH EVERY DAY 06/10/23  Yes Meng, Hao, PA  hydrOXYzine  (ATARAX ) 25 MG tablet Take 1 tablet (25 mg total) by mouth every 6 (six) hours as needed for anxiety. 07/05/22  Yes Sebastian Toribio GAILS, MD  levothyroxine  (SYNTHROID ) 25 MCG tablet Take 12.5 mcg by mouth daily before breakfast.   Yes [provider]  meclizine  (ANTIVERT ) 25 MG tablet Take 1 tablet (25 mg total) by mouth 3 (three) times daily as needed for dizziness. Patient taking differently: Take 25 mg by mouth 3 (three) times daily as needed for dizziness or nausea. 09/20/21  Yes Jonne Rima, MD  memantine  (NAMENDA ) 10 MG tablet Take 10 mg by mouth 2 (two) times daily. 12/27/21  Yes [provider]  metoprolol  succinate (TOPROL  XL) 50 MG 24 hr tablet Take 1 tablet (50 mg total) by mouth at bedtime. 02/17/23 02/17/24 Yes Pokhrel, Laxman, MD  NEXIUM 24HR 20 MG capsule Take 40 mg by mouth daily before breakfast.   Yes [provider]  ondansetron  (ZOFRAN -ODT) 4 MG disintegrating tablet Take 4 mg by mouth every 8 (eight) hours as needed for nausea or vomiting.   Yes [provider]  oxybutynin  (DITROPAN ) 5 MG tablet Take 5 mg by mouth 2 (two) times daily.   Yes [provider]  pantoprazole  (PROTONIX ) 40 MG tablet Take 1 tablet (40 mg total) by mouth daily. 04/27/23  Yes Meng, Hao, PA  Polyethyl Glycol-Propyl  Glycol (SYSTANE OP) Place 1 drop into both eyes 2 (two) times daily as needed (for dryness).   Yes [provider]  potassium chloride  SA (KLOR-CON  M) 20 MEQ tablet Take 1 tablet (20 mEq total) by mouth daily. 04/27/23  Yes Meng, Hao, PA  QUEtiapine  (SEROQUEL ) 50 MG tablet Take 50 mg by mouth at bedtime. 04/22/22  Yes [provider]  sennosides-docusate sodium  (SENOKOT-S) 8.6-50 MG tablet Take 1 tablet by mouth daily as needed for constipation.   Yes [provider]  tamsulosin  (FLOMAX ) 0.4 MG CAPS capsule Take 0.4 mg by mouth at bedtime.   Yes [provider]  valsartan  (DIOVAN ) 80 MG tablet Take 80 mg by mouth daily. 07/01/23  Yes [provider]  venlafaxine  XR (EFFEXOR -XR) 150 MG 24 hr capsule TAKE 1 CAPSULE (150 MG TOTAL) BY MOUTH DAILY. Patient taking differently: Take 150 mg by mouth daily. 12/14/12  Yes Tish Elsie FALCON, MD  amLODipine  (NORVASC ) 10 MG tablet Take 1 tablet (10 mg total) by mouth daily. Patient not taking: Reported on 07/09/2023 02/18/23 02/18/24  Pokhrel, Laxman, MD  nitroGLYCERIN  (NITROSTAT ) 0.4 MG SL tablet Place 0.4 mg under the tongue as needed.    [provider]    Physical Exam: Vitals:   07/09/23 1700 07/09/23 1817 07/09/23 1900 07/09/23 2007  BP: 125/66 (!) 154/58    Pulse: 80 72    Resp: (!) 22 18    Temp:  98 F (36.7 C)    SpO2: 97% 99%    Weight:   86.6 kg 86.6 kg  Height:   5' 3 (1.6 m) 5' 3 (1.6 m)   Physical Exam:  General: No acute distress, well developed, well nourished HEENT: Normocephalic, atraumatic, PERRL Cardiovascular: Normal rate and rhythm. Distal pulses intact. Pulmonary: Normal pulmonary effort, normal breath sounds Gastrointestinal: Nondistended abdomen, soft, mild tenderness, normoactive bowel sounds Musculoskeletal:Normal ROM, bilateral lower ext edema Skin: Skin is warm and dry. Neuro: No focal deficits noted, AAOx3. PSYCH: Attentive and cooperative  Data  Reviewed:  Results for orders  placed or performed during the hospital encounter of 07/09/23 (from the past 24 hours)  Comprehensive metabolic panel     Status: Abnormal   Collection Time: 07/09/23  1:53 PM  Result Value Ref Range   Sodium 137 135 - 145 mmol/L   Potassium 4.0 3.5 - 5.1 mmol/L   Chloride 101 98 - 111 mmol/L   CO2 24 22 - 32 mmol/L   Glucose, Bld 118 (H) 70 - 99 mg/dL   BUN 14 8 - 23 mg/dL   Creatinine, Ser 8.74 (H) 0.44 - 1.00 mg/dL   Calcium  9.5 8.9 - 10.3 mg/dL   Total Protein 7.2 6.5 - 8.1 g/dL   Albumin 4.0 3.5 - 5.0 g/dL   AST 17 15 - 41 U/L   ALT 11 0 - 44 U/L   Alkaline Phosphatase 106 38 - 126 U/L   Total Bilirubin 0.5 0.0 - 1.2 mg/dL   GFR, Estimated 43 (L) >60 mL/min   Anion gap 12 5 - 15  CBC with Differential     Status: Abnormal   Collection Time: 07/09/23  1:53 PM  Result Value Ref Range   WBC 7.3 4.0 - 10.5 K/uL   RBC 4.09 3.87 - 5.11 MIL/uL   Hemoglobin 11.0 (L) 12.0 - 15.0 g/dL   HCT 65.5 (L) 63.9 - 53.9 %   MCV 84.1 80.0 - 100.0 fL   MCH 26.9 26.0 - 34.0 pg   MCHC 32.0 30.0 - 36.0 g/dL   RDW 86.7 88.4 - 84.4 %   Platelets 314 150 - 400 K/uL   nRBC 0.0 0.0 - 0.2 %   Neutrophils Relative % 69 %   Neutro Abs 5.1 1.7 - 7.7 K/uL   Lymphocytes Relative 17 %   Lymphs Abs 1.2 0.7 - 4.0 K/uL   Monocytes Relative 11 %   Monocytes Absolute 0.8 0.1 - 1.0 K/uL   Eosinophils Relative 2 %   Eosinophils Absolute 0.1 0.0 - 0.5 K/uL   Basophils Relative 1 %   Basophils Absolute 0.1 0.0 - 0.1 K/uL   Immature Granulocytes 0 %   Abs Immature Granulocytes 0.03 0.00 - 0.07 K/uL  Occult blood card to lab, stool     Status: Abnormal   Collection Time: 07/09/23  1:53 PM  Result Value Ref Range   Fecal Occult Bld POSITIVE (A) NEGATIVE     Assessment and Plan: symptomatic rectal bleeding on Eliquis  - - [facility] consulted - N.p.o. after midnight  - Monitor hemoglobin - hold Eliquis   2.  History of A-fib with RVR - holding Eliquis  and Toprol  for  now.  3.  Mild dementia - monitor for delirium  Holding all unnecessary meds at this time.   Advance Care Planning:   Code Status: Full Code   Consults: [facility]  Family Communication: none  Severity of Illness: The appropriate patient status for this patient is INPATIENT. Inpatient status is judged to be reasonable and necessary in order to provide the required intensity of service to ensure the patient's safety. The patient's presenting symptoms, physical exam findings, and initial radiographic and laboratory data in the context of their chronic comorbidities is felt to place them at high risk for further clinical deterioration. Furthermore, it is not anticipated that the patient will be medically stable for discharge from the hospital within 2 midnights of admission.   * I certify that at the point of admission it is my clinical judgment that the patient will require inpatient hospital care spanning beyond  2 midnights from the point of admission due to high intensity of service, high risk for further deterioration and high frequency of surveillance required.*  Author: ARTHEA CHILD, MD 07/09/2023 8:51 PM  For on call review [email or web address].

## 2023-07-09 NOTE — ED Notes (Signed)
 Attempted to call receiving RN to give report. Unavailable at this time. Gave secretary call back number to [facility].

## 2023-07-09 NOTE — ED Provider Notes (Signed)
 [place] EMERGENCY DEPARTMENT AT [facility] Provider Note   CSN: [id number] Arrival date & time: 07/09/23  1218     History  Chief Complaint  Patient presents with   Tremors    Brenda Stephens is a 83 y.o. female.  Patient is an 83 year old female with a past medical history of hypothyroidism, A-fib in the VTE on Eliquis , GERD, anxiety, chronic abdominal pain presenting to the emergency department with weakness and dizziness.  Patient is here with her daughter who states that she has a history of GI issues where she gets stomach pain and nausea with eating and so does not have a very good appetite.  She states that she also does have a history of constipation and hemorrhoids and has frequent rectal bleeding.  She states that she does follow with a GI doctor and has been trialed on different medications but has not had any recent colonoscopies or endoscopies.  She states that today she was planned to go out to lunch with a friend and when she tried to get out of her car she became very lightheaded and dizzy and weak.  She was able to sit back down into the car without falling and her daughter drove her to the emergency department.  Again when she tried to get out of the car here she became weak and dizzy and her daughter had to lower her to the ground.  She denies hitting her head or losing consciousness.  She denies any chest pain or abdominal pain.  She states that she has a history of tremors as well and that her tremors have been worse with the weakness recently.  She states that she has had progressive increased weakness over the last 1 to 2 weeks but has been much worse since yesterday.  The history is provided by the patient and a relative.       Home Medications Prior to Admission medications   Medication Sig Start Date End Date Taking? Authorizing Provider  DULoxetine  (CYMBALTA ) 60 MG capsule Take 120 mg by mouth daily. 06/10/23  Yes [provider]   levofloxacin (LEVAQUIN) 500 MG tablet Take 500 mg by mouth daily. 06/29/23  Yes [provider]  Memantine  HCl-Donepezil  HCl 28-10 MG CP24 Take 1 capsule by mouth daily. 06/10/23  Yes [provider]  valsartan  (DIOVAN ) 80 MG tablet Take 80 mg by mouth daily. 07/01/23  Yes [provider]  acetaminophen  (TYLENOL ) 650 MG CR tablet Take 1 tablet (650 mg total) by mouth every 8 (eight) hours as needed for pain (headaches). Patient taking differently: Take 1,300 mg by mouth every 8 (eight) hours as needed for pain (or headaches). 07/05/22   Sebastian Toribio GAILS, MD  albuterol  (VENTOLIN  HFA) 108 [phone] Base) MCG/ACT inhaler Inhale 2 puffs into the lungs every 6 (six) hours as needed for wheezing or shortness of breath. 08/25/22   Neysa Rama D, MD  amLODipine  (NORVASC ) 10 MG tablet Take 1 tablet (10 mg total) by mouth daily. 02/18/23 02/18/24  Pokhrel, Vernal, MD  apixaban  (ELIQUIS ) 5 MG TABS tablet Take 1 tablet (5 mg total) by mouth 2 (two) times daily. 08/13/22   Samtani, Jai-Gurmukh, MD  benzonatate  (TESSALON ) 200 MG capsule TAKE 1 CAPSULE (200 MG TOTAL) BY MOUTH 3 (THREE) TIMES DAILY AS NEEDED FOR COUGH. 06/15/23   Young, Rama D, MD  Biotin  10000 MCG TABS Take 10,000 mcg by mouth daily.    [provider]  cetirizine (ZYRTEC) 10 MG tablet Take 10 mg  by mouth at bedtime.    [provider]  donepezil  (ARICEPT ) 10 MG tablet Take 10 mg by mouth at bedtime. 12/27/21   [provider]  furosemide  (LASIX ) 20 MG tablet TAKE 1 TABLET BY MOUTH EVERY DAY 06/10/23   Meng, Hao, PA  gabapentin  (NEURONTIN ) 100 MG capsule Take 200-300 mg by mouth daily.    [provider]  hydrOXYzine  (ATARAX ) 25 MG tablet Take 1 tablet (25 mg total) by mouth every 6 (six) hours as needed for anxiety. 07/05/22   Sebastian Toribio GAILS, MD  levothyroxine  (SYNTHROID ) 25 MCG tablet Take 12.5 mcg by mouth daily before breakfast.    [provider]  meclizine  (ANTIVERT ) 25 MG  tablet Take 1 tablet (25 mg total) by mouth 3 (three) times daily as needed for dizziness. Patient taking differently: Take 25 mg by mouth 3 (three) times daily as needed for dizziness or nausea. 09/20/21   Jonne Rima, MD  memantine  (NAMENDA ) 10 MG tablet Take 10 mg by mouth 2 (two) times daily. 12/27/21   [provider]  metoprolol  succinate (TOPROL  XL) 50 MG 24 hr tablet Take 1 tablet (50 mg total) by mouth at bedtime. 02/17/23 02/17/24  Pokhrel, Laxman, MD  NEXIUM 24HR 20 MG capsule Take 40 mg by mouth daily before breakfast.    [provider]  oxybutynin  (DITROPAN ) 5 MG tablet Take 5 mg by mouth 2 (two) times daily.    [provider]  pantoprazole  (PROTONIX ) 40 MG tablet Take 1 tablet (40 mg total) by mouth daily. 04/27/23   Meng, Hao, PA  Polyethyl Glycol-Propyl Glycol (SYSTANE OP) Place 1 drop into both eyes 2 (two) times daily as needed (for dryness).    [provider]  potassium chloride  SA (KLOR-CON  M) 20 MEQ tablet Take 1 tablet (20 mEq total) by mouth daily. 04/27/23   Meng, Hao, PA  QUEtiapine  (SEROQUEL ) 50 MG tablet Take 50 mg by mouth at bedtime. 04/22/22   [provider]  sennosides-docusate sodium  (SENOKOT-S) 8.6-50 MG tablet Take 1 tablet by mouth daily as needed for constipation.    [provider]  tamsulosin  (FLOMAX ) 0.4 MG CAPS capsule Take 0.4 mg by mouth at bedtime.    [provider]  venlafaxine  XR (EFFEXOR -XR) 150 MG 24 hr capsule TAKE 1 CAPSULE (150 MG TOTAL) BY MOUTH DAILY. Patient taking differently: Take 150 mg by mouth daily. 12/14/12   Tish Elsie FALCON, MD      Allergies    Latex, Sulfa  antibiotics, Aspirin , Nsaids, and Oxycodone -acetaminophen     Review of Systems   Review of Systems  Physical Exam Updated Vital Signs BP 124/63 (BP Location: Right Arm)   Pulse 99   Temp 97.7 F (36.5 C)   Resp 20   SpO2 98%  Physical Exam Vitals and nursing note reviewed. Exam conducted with a chaperone  present Rexanne Polite RN).  Constitutional:      General: She is not in acute distress.    Appearance: Normal appearance.  HENT:     Head: Normocephalic and atraumatic.     Nose: Nose normal.     Mouth/Throat:     Mouth: Mucous membranes are moist.     Pharynx: Oropharynx is clear.  Eyes:     Extraocular Movements: Extraocular movements intact.     Comments: Mildly pale conjunctivea  Cardiovascular:     Rate and Rhythm: Normal rate and regular rhythm.     Heart sounds: Normal heart sounds.  Pulmonary:  Effort: Pulmonary effort is normal.     Breath sounds: Normal breath sounds.  Abdominal:     General: Abdomen is flat.     Palpations: Abdomen is soft.     Tenderness: There is no abdominal tenderness.  Genitourinary:    Rectum: Guaiac result positive (positive, small amount of gross blood in the rectum).     Comments: Small nonthrombosed hemorrhoids Musculoskeletal:        General: Normal range of motion.     Cervical back: Normal range of motion and neck supple.  Skin:    General: Skin is warm and dry.  Neurological:     General: No focal deficit present.     Mental Status: She is alert and oriented to person, place, and time.  Psychiatric:        Mood and Affect: Mood normal.        Behavior: Behavior normal.     ED Results / Procedures / Treatments   Labs (all labs ordered are listed, but only abnormal results are displayed) Labs Reviewed  COMPREHENSIVE METABOLIC PANEL - Abnormal; Notable for the following components:      Result Value   Glucose, Bld 118 (*)    Creatinine, Ser 1.25 (*)    GFR, Estimated 43 (*)    All other components within normal limits  CBC WITH DIFFERENTIAL/PLATELET - Abnormal; Notable for the following components:   Hemoglobin 11.0 (*)    HCT 34.4 (*)    All other components within normal limits  OCCULT BLOOD X 1 CARD TO LAB, STOOL - Abnormal; Notable for the following components:   Fecal Occult Bld POSITIVE (*)    All other components  within normal limits    EKG EKG Interpretation Date/Time:  Thursday July 09 2023 14:00:36 EST Ventricular Rate:  82 PR Interval:  178 QRS Duration:  82 QT Interval:  388 QTC Calculation: 454 R Axis:   -9  Text Interpretation: Sinus rhythm Low voltage, precordial leads Abnormal R-wave progression, early transition Borderline T abnormalities, anterior leads No significant change since last tracing Confirmed by Ellouise Fine ([id number]) on 07/09/2023 2:08:12 PM  Radiology No results found.  Procedures Procedures    Medications Ordered in ED Medications  sodium chloride  0.9 % bolus 500 mL (has no administration in time range)    ED Course/ Medical Decision Making/ A&P Clinical Course as of 07/09/23 1459  Thu Jul 09, 2023  1430 Orthostatics positive from lying to sitting, recovered with standing. [VK]  1433 Does have hemoccult positive stool, anemia not significant worse from baseline. Will consult GI in the setting of her GIB on anticoagulation. [VK]  1450 Mild increased Cr, patient receiving IVF. [VK]  1458 Patient signed out to Dr. Mannie pending call back from GI with likely plan for admission. [VK]    Clinical Course User Index [VK] Kingsley, Vergil Burby K, DO                                 Medical Decision Making This patient presents to the ED with chief complaint(s) of weakness, dizziness with pertinent past medical history of A-fib and VTE on Eliquis , chronic abdominal pain, hypothyroidism which further complicates the presenting complaint. The complaint involves an extensive differential diagnosis and also carries with it a high risk of complications and morbidity.    The differential diagnosis includes dehydration, electrolyte abnormality, anemia, GI bleed, orthostatic hypotension, arrhythmia, no focal neurologic deficits making  CVA unlikely  Additional history obtained: Additional history obtained from family Records reviewed Care Everywhere/External Records  ED  Course and Reassessment: On patient's arrival she is hemodynamically stable in no acute distress.  Patient will have EKG, labs and Hemoccult performed in the setting of her acute weakness and dizziness and will be closely reassessed.  Independent labs interpretation:  The following labs were independently interpreted: hemoccult positive stool, mild anemia stable, mild increased Cr  Independent visualization of imaging: - N/A  Consultation: - Consulted or discussed management/test interpretation w/ external professional: GI   Amount and/or Complexity of Data Reviewed Labs: ordered.          Final Clinical Impression(s) / ED Diagnoses Final diagnoses:  Gastrointestinal hemorrhage, unspecified gastrointestinal hemorrhage type  Orthostatic hypotension  AKI (acute kidney injury) [facility])    Rx / DC Orders ED Discharge Orders     None         Kingsley, Tiquan Bouch K, DO 07/09/23 1459

## 2023-07-09 NOTE — ED Notes (Signed)
 Called [facility] for transport, pt bed assignment is ready

## 2023-07-09 NOTE — Progress Notes (Signed)
 Patient is transferred from [facility] to [facility]. Alert and oriented x 4. Daughter is at bedside. Room is set up , vital sign was taken. Paged MD admission on call as well. Waiting for orders.

## 2023-07-09 NOTE — ED Triage Notes (Signed)
 Pt reports increased tremors along with dizziness for past few days.  Pt has intermittent rectal bleeding and generalized weakness.  While walking into the ED today, pt felt like she needed to sit down and her daughter with her in triage now helped lower her to the ground in the parking lot.

## 2023-07-10 ENCOUNTER — Observation Stay (HOSPITAL_COMMUNITY): Payer: MEDICARE

## 2023-07-10 DIAGNOSIS — I4891 Unspecified atrial fibrillation: Secondary | ICD-10-CM | POA: Diagnosis not present

## 2023-07-10 DIAGNOSIS — K625 Hemorrhage of anus and rectum: Secondary | ICD-10-CM | POA: Diagnosis not present

## 2023-07-10 DIAGNOSIS — Z86718 Personal history of other venous thrombosis and embolism: Secondary | ICD-10-CM | POA: Diagnosis not present

## 2023-07-10 DIAGNOSIS — Z7901 Long term (current) use of anticoagulants: Secondary | ICD-10-CM | POA: Diagnosis not present

## 2023-07-10 LAB — CBC
HCT: 34.3 % — ABNORMAL LOW (ref 36.0–46.0)
Hemoglobin: 10.5 g/dL — ABNORMAL LOW (ref 12.0–15.0)
MCH: 26.9 pg (ref 26.0–34.0)
MCHC: 30.6 g/dL (ref 30.0–36.0)
MCV: 87.9 fL (ref 80.0–100.0)
Platelets: 307 10*3/uL (ref 150–400)
RBC: 3.9 MIL/uL (ref 3.87–5.11)
RDW: 13.3 % (ref 11.5–15.5)
WBC: 7.8 10*3/uL (ref 4.0–10.5)
nRBC: 0 % (ref 0.0–0.2)

## 2023-07-10 LAB — BASIC METABOLIC PANEL
Anion gap: 9 (ref 5–15)
BUN: 21 mg/dL (ref 8–23)
CO2: 23 mmol/L (ref 22–32)
Calcium: 9.2 mg/dL (ref 8.9–10.3)
Chloride: 104 mmol/L (ref 98–111)
Creatinine, Ser: 0.92 mg/dL (ref 0.44–1.00)
GFR, Estimated: >60 mL/min (ref >60)
Glucose, Bld: 140 mg/dL — ABNORMAL HIGH (ref 70–99)
Potassium: 3.8 mmol/L (ref 3.5–5.1)
Sodium: 136 mmol/L (ref 135–145)

## 2023-07-10 LAB — TYPE AND SCREEN
ABO/RH(D): A POS
Antibody Screen: NEGATIVE

## 2023-07-10 MED ORDER — DULOXETINE HCL 30 MG PO CPEP
120.0000 mg | ORAL_CAPSULE | Freq: Every day | ORAL | Status: DC
  Administered 2023-07-10: 120 mg via ORAL
  Filled 2023-07-10: qty 4

## 2023-07-10 MED ORDER — DONEPEZIL HCL 10 MG PO TABS
10.0000 mg | ORAL_TABLET | Freq: Every day | ORAL | Status: DC
Start: 2023-07-10 — End: 2023-07-10

## 2023-07-10 MED ORDER — IOHEXOL 350 MG/ML SOLN
100.0000 mL | Freq: Once | INTRAVENOUS | Status: AC | PRN
  Administered 2023-07-10: 100 mL via INTRAVENOUS

## 2023-07-10 MED ORDER — QUETIAPINE FUMARATE 25 MG PO TABS
50.0000 mg | ORAL_TABLET | Freq: Every day | ORAL | Status: DC
Start: 2023-07-10 — End: 2023-07-10

## 2023-07-10 MED ORDER — MEMANTINE HCL 10 MG PO TABS
10.0000 mg | ORAL_TABLET | Freq: Two times a day (BID) | ORAL | Status: DC
  Administered 2023-07-10: 10 mg via ORAL
  Filled 2023-07-10: qty 1

## 2023-07-10 MED ORDER — METOPROLOL SUCCINATE ER 50 MG PO TB24
50.0000 mg | ORAL_TABLET | Freq: Every day | ORAL | Status: DC
Start: 2023-07-10 — End: 2023-07-10

## 2023-07-10 MED ORDER — APIXABAN 5 MG PO TABS
5.0000 mg | ORAL_TABLET | Freq: Two times a day (BID) | ORAL | Status: DC
  Administered 2023-07-10: 5 mg via ORAL
  Filled 2023-07-10: qty 1

## 2023-07-10 MED ORDER — SODIUM CHLORIDE (PF) 0.9 % IJ SOLN
INTRAMUSCULAR | Status: AC
  Filled 2023-07-10: qty 50

## 2023-07-10 MED ORDER — ALBUTEROL SULFATE (2.5 MG/3ML) 0.083% IN NEBU: 2.5000 mg | INHALATION_SOLUTION | Freq: Four times a day (QID) | RESPIRATORY_TRACT | Status: DC | PRN

## 2023-07-10 MED ORDER — ACETAMINOPHEN 500 MG PO TABS: 1000.0000 mg | ORAL_TABLET | Freq: Three times a day (TID) | ORAL | Status: DC | PRN

## 2023-07-10 MED ORDER — PANTOPRAZOLE SODIUM 40 MG PO TBEC
40.0000 mg | DELAYED_RELEASE_TABLET | Freq: Every day | ORAL | Status: DC
  Administered 2023-07-10: 40 mg via ORAL
  Filled 2023-07-10: qty 1

## 2023-07-10 MED ORDER — BIOTIN 10000 MCG PO TABS: 10.0000 mg | ORAL_TABLET | Freq: Every day | ORAL | Status: DC

## 2023-07-10 MED ORDER — TAMSULOSIN HCL 0.4 MG PO CAPS
0.4000 mg | ORAL_CAPSULE | Freq: Every day | ORAL | Status: DC
Start: 2023-07-10 — End: 2023-07-10

## 2023-07-10 MED ORDER — FUROSEMIDE 20 MG PO TABS
20.0000 mg | ORAL_TABLET | Freq: Every day | ORAL | Status: DC
  Administered 2023-07-10: 20 mg via ORAL
  Filled 2023-07-10: qty 1

## 2023-07-10 MED ORDER — LEVOTHYROXINE SODIUM 25 MCG PO TABS
12.5000 ug | ORAL_TABLET | Freq: Every day | ORAL | Status: DC
Start: 2023-07-11 — End: 2023-07-10

## 2023-07-10 NOTE — Consult Note (Signed)
 [facility] Consultation Note  Referring Provider: [facility] Primary Care Physician:  Loris Elsie PARAS, PA-C Primary Gastroenterologist:  Dr. Rosalie  Reason for Consultation:  abdominal pain, blood in stool  HPI: Brenda Stephens is a 83 y.o. female admitted abdominal discomfort and blood in stool.  She's had these problems intermittently for at least 10 years.  Saw Dr. Rosalie in office a couple months ago and he recommended CT abd/pelvis which patient tells me she never had.  Colonoscopy 2014 by Dr. Rosalie for hematochezia showed hemorrhoids otherwise unrevealing.   Past Medical History:  Diagnosis Date   Acute deep vein thrombosis (DVT) of proximal vein of right lower extremity (HCC) 07/02/2022   Acute pulmonary embolism (HCC) 07/15/2022   Allergy    latex   Anemia    Anxiety    Arthritis    Bilateral pulmonary embolism (HCC) 06/30/2022   Cancer (HCC) 01/2017   right breast cancer   Depression    Dyslipidemia    Eustachian tube dysfunction    GERD (gastroesophageal reflux disease)    Grade I diastolic dysfunction 07/15/2022   Hx of cardiovascular stress test    Myoview  10/13:  apical thinning, EF 72%, no ischemia   Hx of colonic polyps    Dr Rosalie   Hypertension    Hypothyroidism    Other abnormal glucose    Pulmonary embolism (HCC) 06/30/2022    Past Surgical History:  Procedure Laterality Date   ABDOMINAL HYSTERECTOMY     BSO for uterine fibroids   ACHILLES TENDON SURGERY Right 10/07/2012   Procedure: RIGHT REPAIR RUPTURE TENDON PRIMARY OPEN/PERCUTANEOUS ;  Surgeon: Toribio JULIANNA Chancy, MD;  Location: [facility];  Service: Orthopedics;  Laterality: Right;   APPENDECTOMY     BREAST LUMPECTOMY WITH RADIOACTIVE SEED AND SENTINEL LYMPH NODE BIOPSY Right 03/16/2017   Procedure: RIGHT BREAST LUMPECTOMY WITH RADIOACTIVE SEED AND RIGHT AXILLARY SENTINEL  NODE BIOPSY ERAS PATHWAY;  Surgeon: Ebbie Cough, MD;  Location: [facility];   Service: General;  Laterality: Right;  PECTORAL BLOCK   CATARACT EXTRACTION, BILATERAL     CHOLECYSTECTOMY     COLONOSCOPY W/ POLYPECTOMY     Dr Rosalie   EAR CYST EXCISION Right 10/07/2012   Procedure: 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 ;  Surgeon: Toribio JULIANNA Chancy, MD;  Location: [facility];  Service: Orthopedics;  Laterality: Right;   EXPLORATORY LAPAROTOMY     LEFT HEART CATH AND CORONARY ANGIOGRAPHY N/A 08/02/2019   Procedure: LEFT HEART CATH AND CORONARY ANGIOGRAPHY;  Surgeon: Jordan, Peter M, MD;  Location: [facility];  Service: Cardiovascular;  Laterality: N/A;   REVISION TOTAL SHOULDER TO REVERSE TOTAL SHOULDER Left 01/12/2019   Procedure: REVISION TOTAL SHOULDER TO REVERSE TOTAL SHOULDER;  Surgeon: Cristy Bonner DASEN, MD;  Location: [facility];  Service: Orthopedics;  Laterality: Left;   SINUS SURGERY WITH INSTATRAK     TOTAL SHOULDER REPLACEMENT     Right shoulder   TOTAL SHOULDER REPLACEMENT  05/2010   Left   TUBAL LIGATION      Prior to Admission medications   Medication Sig Start Date End Date Taking? Authorizing Provider  acetaminophen  (TYLENOL ) 650 MG CR tablet Take 1 tablet (650 mg total) by mouth every 8 (eight) hours as needed for pain (headaches). Patient taking differently: Take 1,300 mg by mouth every 8 (eight) hours as needed for pain (or headaches). 07/05/22  Yes Sebastian Toribio GAILS, MD  albuterol  (VENTOLIN  HFA) 108 (  90 Base) MCG/ACT inhaler Inhale 2 puffs into the lungs every 6 (six) hours as needed for wheezing or shortness of breath. 08/25/22  Yes Young, Clinton D, MD  apixaban  (ELIQUIS ) 5 MG TABS tablet Take 1 tablet (5 mg total) by mouth 2 (two) times daily. 08/13/22  Yes Samtani, Jai-Gurmukh, MD  benzonatate  (TESSALON ) 200 MG capsule TAKE 1 CAPSULE (200 MG TOTAL) BY MOUTH 3 (THREE) TIMES DAILY AS NEEDED FOR COUGH. 06/15/23  Yes Young, Clinton D, MD  Biotin  10000 MCG TABS Take 10,000 mcg by mouth daily.   Yes [provider]   cetirizine (ZYRTEC) 10 MG tablet Take 10 mg by mouth at bedtime.   Yes [provider]  dicyclomine  (BENTYL ) 20 MG tablet Take 20 mg by mouth every 6 (six) hours. 03/30/23  Yes [provider]  donepezil  (ARICEPT ) 10 MG tablet Take 10 mg by mouth at bedtime. 12/27/21  Yes [provider]  DULoxetine  (CYMBALTA ) 60 MG capsule Take 120 mg by mouth daily. 06/10/23  Yes [provider]  furosemide  (LASIX ) 20 MG tablet TAKE 1 TABLET BY MOUTH EVERY DAY 06/10/23  Yes Meng, Hao, PA  hydrOXYzine  (ATARAX ) 25 MG tablet Take 1 tablet (25 mg total) by mouth every 6 (six) hours as needed for anxiety. 07/05/22  Yes Sebastian Toribio GAILS, MD  levothyroxine  (SYNTHROID ) 25 MCG tablet Take 12.5 mcg by mouth daily before breakfast.   Yes [provider]  meclizine  (ANTIVERT ) 25 MG tablet Take 1 tablet (25 mg total) by mouth 3 (three) times daily as needed for dizziness. Patient taking differently: Take 25 mg by mouth 3 (three) times daily as needed for dizziness or nausea. 09/20/21  Yes Jonne Rima, MD  memantine  (NAMENDA ) 10 MG tablet Take 10 mg by mouth 2 (two) times daily. 12/27/21  Yes [provider]  metoprolol  succinate (TOPROL  XL) 50 MG 24 hr tablet Take 1 tablet (50 mg total) by mouth at bedtime. 02/17/23 02/17/24 Yes Pokhrel, Laxman, MD  NEXIUM 24HR 20 MG capsule Take 40 mg by mouth daily before breakfast.   Yes [provider]  ondansetron  (ZOFRAN -ODT) 4 MG disintegrating tablet Take 4 mg by mouth every 8 (eight) hours as needed for nausea or vomiting.   Yes [provider]  oxybutynin  (DITROPAN ) 5 MG tablet Take 5 mg by mouth 2 (two) times daily.   Yes [provider]  pantoprazole  (PROTONIX ) 40 MG tablet Take 1 tablet (40 mg total) by mouth daily. 04/27/23  Yes Meng, Hao, PA  Polyethyl Glycol-Propyl Glycol (SYSTANE OP) Place 1 drop into both eyes 2 (two) times daily as needed (for dryness).   Yes [provider]  potassium  chloride SA (KLOR-CON  M) 20 MEQ tablet Take 1 tablet (20 mEq total) by mouth daily. 04/27/23  Yes Meng, Hao, PA  QUEtiapine  (SEROQUEL ) 50 MG tablet Take 50 mg by mouth at bedtime. 04/22/22  Yes [provider]  sennosides-docusate sodium  (SENOKOT-S) 8.6-50 MG tablet Take 1 tablet by mouth daily as needed for constipation.   Yes [provider]  tamsulosin  (FLOMAX ) 0.4 MG CAPS capsule Take 0.4 mg by mouth at bedtime.   Yes [provider]  valsartan  (DIOVAN ) 80 MG tablet Take 80 mg by mouth daily. 07/01/23  Yes [provider]  venlafaxine  XR (EFFEXOR -XR) 150 MG 24 hr capsule TAKE 1 CAPSULE (150 MG TOTAL) BY MOUTH DAILY. Patient taking differently: Take 150 mg by mouth daily. 12/14/12  Yes Tish Elsie FALCON, MD  amLODipine  (NORVASC ) 10 MG tablet  Take 1 tablet (10 mg total) by mouth daily. Patient not taking: Reported on 07/09/2023 02/18/23 02/18/24  Sonjia Held, MD  nitroGLYCERIN  (NITROSTAT ) 0.4 MG SL tablet Place 0.4 mg under the tongue as needed.    [provider]    Current Facility-Administered Medications  Medication Dose Route Frequency Provider Last Rate Last Admin   acetaminophen  (TYLENOL ) CR tablet 1,300 mg  1,300 mg Oral Q8H PRN Sheikh, Omair Latif, DO       acetaminophen  (TYLENOL ) tablet 650 mg  650 mg Oral Q6H PRN Arthea Child, MD       Or   acetaminophen  (TYLENOL ) suppository 650 mg  650 mg Rectal Q6H PRN Arthea Child, MD       albuterol  (VENTOLIN  HFA) 108 (90 Base) MCG/ACT inhaler 2 puff  2 puff Inhalation Q6H PRN Sherrill, Omair Latif, DO       apixaban  (ELIQUIS ) tablet 5 mg  5 mg Oral BID Sheikh, Omair Latif, DO       Biotin  TABS 10 mg  10 mg Oral Daily Sheikh, Omair Latif, DO       donepezil  (ARICEPT ) tablet 10 mg  10 mg Oral QHS Sheikh, Omair Latif, DO       DULoxetine  (CYMBALTA ) DR capsule 120 mg  120 mg Oral Daily Sheikh, Omair Latif, DO       furosemide  (LASIX ) tablet 20 mg  20 mg Oral Daily Sheikh, Omair Latif, [place] 07/11/2023] levothyroxine  (SYNTHROID ) tablet 12.5 mcg  12.5 mcg Oral QAC breakfast Sheikh, Alejandro Latif, DO       memantine  (NAMENDA ) tablet 10 mg  10 mg Oral BID Sheikh, Omair Latif, DO       metoprolol  succinate (TOPROL -XL) 24 hr tablet 50 mg  50 mg Oral QHS Sheikh, Omair Latif, DO       ondansetron  (ZOFRAN ) tablet 4 mg  4 mg Oral Q6H PRN Arthea Child, MD       Or   ondansetron  (ZOFRAN ) injection 4 mg  4 mg Intravenous Q6H PRN Arthea Child, MD   4 mg at 07/10/23 0103   pantoprazole  (PROTONIX ) EC tablet 40 mg  40 mg Oral Daily Sheikh, Omair Latif, DO       QUEtiapine  (SEROQUEL ) tablet 50 mg  50 mg Oral QHS Sheikh, Omair Latif, DO       sorbitol  70 % solution 30 mL  30 mL Oral Daily PRN Arthea Child, MD       tamsulosin  (FLOMAX ) capsule 0.4 mg  0.4 mg Oral QHS Sheikh, Omair [place], DO        Allergies as of 07/09/2023 - Review Complete 07/09/2023  Allergen Reaction Noted   Latex Rash 03/31/2014   Sulfa  antibiotics Anaphylaxis and Nausea Only 01/22/2017   Aspirin  Other (See Comments) 03/31/2014   Nsaids Other (See Comments) 06/30/2022   Oxycodone -acetaminophen  Nausea Only 03/31/2014    Family History  Problem Relation Age of Onset   Hiatal hernia Mother    Heart attack Mother 52   Heart disease Father 49       CAD @ autopsy   Cancer Father        renal cancer   Heart attack Brother 6       fatal   Diabetes Neg Hx    Stroke Neg Hx     Social History   Socioeconomic History   Marital status: Widowed    Spouse name: Not on file   Number of children: 1  Years of education: Not on file   Highest education level: Not on file  Occupational History   Not on file  Tobacco Use   Smoking status: Former    Current packs/day: 0.00    Average packs/day: 0.5 packs/day for 6.0 years (3.0 ttl pk-yrs)    Types: Cigarettes    Start date: 06/30/1956    Quit date: 06/30/1962    Years since quitting: 61.0    Passive exposure: Never   Smokeless tobacco: Never    Tobacco comments:    Quit age 4  Vaping Use   Vaping status: Never Used  Substance and Sexual Activity   Alcohol  use: Yes    Alcohol /week: 3.0 standard drinks of alcohol     Types: 3 Glasses of wine per week    Comment: wine  : < 3 glasses weekly   Drug use: No   Sexual activity: Yes    Birth control/protection: Post-menopausal, Surgical  Other Topics Concern   Not on file  Social History Narrative   Lives husband and daughter lives with her.    Social Drivers of Health   Financial Resource Strain: Low Risk  (03/12/2021)   Received from [facility] visits prior to 08/30/2022., [facility] visits prior to 08/30/2022.   Overall Financial Resource Strain (CARDIA)    Difficulty of Paying Living Expenses: Not very hard  Food Insecurity: No Food Insecurity (07/09/2023)   Hunger Vital Sign    Worried About Running Out of Food in the Last Year: Never true    Ran Out of Food in the Last Year: Never true  Transportation Needs: No Transportation Needs (07/09/2023)   PRAPARE - Administrator, Civil Service (Medical): No    Lack of Transportation (Non-Medical): No  Physical Activity: Inactive (03/12/2021)   Received from [facility] visits prior to 08/30/2022., [facility] visits prior to 08/30/2022.   Exercise Vital Sign    Days of Exercise per Week: 0 days    Minutes of Exercise per Session: 0 min  Stress: Stress Concern Present (03/12/2021)   Received from [facility] visits prior to 08/30/2022., [facility] visits prior to 08/30/2022.   [employer] of Occupational Health - Occupational Stress Questionnaire    Feeling of Stress : Rather much  Social Connections: Unknown (07/09/2023)   Social Connection and Isolation Panel [NHANES]    Frequency of Communication with Friends and Family: More than three times a week    Frequency of Social Gatherings with  Friends and Family: More than three times a week    Attends Religious Services: More than 4 times per year    Active Member of Clubs or Organizations: Patient declined    Attends Banker Meetings: Patient declined    Marital Status: Patient declined  Intimate Partner Violence: Not At Risk (07/09/2023)   Humiliation, Afraid, Rape, and Kick questionnaire    Fear of Current or Ex-Partner: No    Emotionally Abused: No    Physically Abused: No    Sexually Abused: No    Review of Systems: As per HPI, all others negative  Physical Exam: Vital signs in last 24 hours: Temp:  [97.7 F (36.5 C)-98.3 F (36.8 C)] 98.1 F (36.7 C) (01/10 0621) Pulse Rate:  [72-99] 85 (01/10 0621) Resp:  [16-22] 16 (01/09 2221) BP: (112-154)/(58-100) 150/100 (01/10 0621) SpO2:  [96 %-99 %] 97 % (01/10 0621)  Weight:  [86.6 kg] 86.6 kg (01/09 2007)   General:   Alert,  overweight, pleasant and cooperative in NAD Head:  Normocephalic and atraumatic. Eyes:  Sclera clear, no icterus.   Conjunctiva pink. Ears:  Normal auditory acuity. Nose:  No deformity, discharge,  or lesions. Mouth:  No deformity or lesions.  Oropharynx pink & moist. Neck:  Supple; no masses or thyromegaly. Lungs:  No visible respiratory distress Abdomen:  Soft, nontender and nondistended. No masses, hepatosplenomegaly or hernias noted. Normal bowel sounds, without guarding, and without rebound.     Msk:  Symmetrical without gross deformities. Normal posture. Pulses:  Normal pulses noted. Extremities:  Without clubbing or edema. Neurologic:  Alert and  oriented x4; diffusely weak otherwise grossly normal neurologically. Skin:  Scattered ecchymoses otherwise Intact without significant lesions or rashes. Psych:  Alert and cooperative. Normal mood and affect.   Lab Results: Recent Labs    07/09/23 1353 07/10/23 0556  WBC 7.3 7.8  HGB 11.0* 10.5*  HCT 34.4* 34.3*  PLT 314 307   BMET Recent Labs    07/09/23 1353  07/10/23 0556  NA 137 136  K 4.0 3.8  CL 101 104  CO2 24 23  GLUCOSE 118* 140*  BUN 14 21  CREATININE 1.25* 0.92  CALCIUM  9.5 9.2   LFT Recent Labs    07/09/23 1353  PROT 7.2  ALBUMIN 4.0  AST 17  ALT 11  ALKPHOS 106  BILITOT 0.5   PT/INR No results for input(s): LABPROT, INR in the last 72 hours.  Studies/Results: No results found.  Impression:   Abdominal discomfort.  Longstanding problem. Blood in stool, mild, longstanding problem intermittently dating back at least 10 years.  Colonoscopy 2014 for hematochezia showed hemorrhoids otherwise unrevealing. Mild anemia.  Plan:   I think ok to continue Eliquis  and advance diet and discharge home unless there are other things going on. Dr. Rosalie saw her in office in November 2024 and wanted to do CT abd/pelvis, so we will try to expedite that here in hospital and if negative she can go home today and follow-up as outpatient with Dr. Rosalie. Case discussed with hospitalist team.   LOS: 0 days   Monserath Neff M  07/10/2023, 11:07 AM  Cell [phone] If no answer or after 5 PM call [phone]

## 2023-07-10 NOTE — Evaluation (Addendum)
 Physical Therapy Evaluation Patient Details Name: Brenda Stephens MRN: [id number] DOB: June 21, 1941 Today's Date: 07/10/2023  History of Present Illness  83 y.o. female admitted 07/09/23 with lightheadedness/weakness. Dx of rectal bleed. Pt with medical history significant for mild dementia, history of atrial fibrillation on Eliquis , history of PE, hypothyroidism, and hypertension  Clinical Impression  Pt admitted with above diagnosis. Pt ambulated 69' with a straight cane, no loss of balance, distance limited by fatigue. Encouraged pt to consider use of a walker for increased support when ambulating, pt/daughter voiced understanding. [name] is home 24/7 and can provide needed level of assistance. She is ready to DC home from a PT standpoint. HHPT recommended.  Pt currently with functional limitations due to the deficits listed below (see PT Problem List). Pt will benefit from acute skilled PT to increase their independence and safety with mobility to allow discharge.           If plan is discharge home, recommend the following:     Can travel by private vehicle        Equipment Recommendations None recommended by PT  Recommendations for Other Services       Functional Status Assessment Patient has had a recent decline in their functional status and demonstrates the ability to make significant improvements in function in a reasonable and predictable amount of time.     Precautions / Restrictions Precautions Precautions: Fall Precaution Comments: 1 fall in past 6 months (slid out of bed) Restrictions Weight Bearing Restrictions Per Provider Order: No      Mobility  Bed Mobility Overal bed mobility: Modified Independent                  Transfers Overall transfer level: Needs assistance Equipment used: Rolling walker (2 wheels) Transfers: Sit to/from Stand Sit to Stand: Min assist           General transfer comment: Min A to power up     Ambulation/Gait Ambulation/Gait assistance: Contact guard assist Gait Distance (Feet): 60 Feet Assistive device: straight cane Gait Pattern/deviations: Step-through pattern, Decreased stride length Gait velocity: decr     General Gait Details: steady, no loss of balance, distance limited by fatigue, HR 120 walking  Stairs            Wheelchair Mobility     Tilt Bed    Modified Rankin (Stroke Patients Only)       Balance Overall balance assessment: Needs assistance Sitting-balance support: Feet supported, No upper extremity supported Sitting balance-Leahy Scale: Good     Standing balance support: Bilateral upper extremity supported, During functional activity, Reliant on assistive device for balance Standing balance-Leahy Scale: Fair                               Pertinent Vitals/Pain Pain Assessment Pain Assessment: No/denies pain    Home Living Family/patient expects to be discharged to:: Private residence Living Arrangements: Children Available Help at Discharge: Family;Available 24 hours/day Type of Home: House Home Access: Stairs to enter   [employer] of Steps: 2 Alternate Level Stairs-Number of Steps: stairs, landing, stairs Home Layout: Two level;Bed/bath upstairs Home Equipment: Rolling Walker (2 wheels);Shower seat;Hand held shower head;Grab bars - toilet;Grab bars - tub/shower;Cane - single point;Rollator (4 wheels)      Prior Function Prior Level of Function : Needs assist  Cognitive Assist : ADLs (cognitive)           Mobility  Comments: Modified independent with cane primarily ADLs Comments: daughter assists with dressing and does the driving     Extremity/Trunk Assessment   Upper Extremity Assessment Upper Extremity Assessment: Defer to OT evaluation    Lower Extremity Assessment Lower Extremity Assessment: Overall WFL for tasks assessed    Cervical / Trunk Assessment Cervical / Trunk Assessment:  Kyphotic  Communication   Communication Communication: No apparent difficulties  Cognition Arousal: Alert Behavior During Therapy: WFL for tasks assessed/performed Overall Cognitive Status: Within Functional Limits for tasks assessed                                          General Comments      Exercises     Assessment/Plan    PT Assessment Patient needs continued PT services  PT Problem List Decreased activity tolerance;Decreased mobility       PT Treatment Interventions Gait training;Therapeutic exercise;Therapeutic activities    PT Goals (Current goals can be found in the Care Plan section)  Acute Rehab PT Goals Patient Stated Goal: be able to walk farther PT Goal Formulation: With patient/family Time For Goal Achievement: 07/24/23 Potential to Achieve Goals: Good    Frequency Min 1X/week     Co-evaluation               AM-PAC PT 6 Clicks Mobility  Outcome Measure Help needed turning from your back to your side while in a flat bed without using bedrails?: None Help needed moving from lying on your back to sitting on the side of a flat bed without using bedrails?: None Help needed moving to and from a bed to a chair (including a wheelchair)?: A Little Help needed standing up from a chair using your arms (e.g., wheelchair or bedside chair)?: A Little Help needed to walk in hospital room?: A Little Help needed climbing 3-5 steps with a railing? : A Little 6 Click Score: 20    End of Session Equipment Utilized During Treatment: Gait belt Activity Tolerance: Patient tolerated treatment well Patient left: in chair;with call bell/phone within reach;with family/visitor present Nurse Communication: Mobility status PT Visit Diagnosis: Unsteadiness on feet (R26.81);Difficulty in walking, not elsewhere classified (R26.2)    Time: 8693-8679 PT Time Calculation (min) (ACUTE ONLY): 14 min   Charges:   PT Evaluation $PT Eval Moderate  Complexity: 1 Mod   PT General Charges $$ ACUTE PT VISIT: 1 Visit        Sylvan Delon Copp PT 07/10/2023  Acute Rehabilitation Services  Office [phone]

## 2023-07-10 NOTE — Evaluation (Signed)
 Occupational Therapy Evaluation Patient Details Name: Brenda Stephens MRN: [id number] DOB: February 14, 1941 Today's Date: 07/10/2023   History of Present Illness 83 y.o. female admitted 07/09/23 with lightheadedness/weakness. Dx of rectal bleed. Pt with medical history significant for mild dementia, history of atrial fibrillation on Eliquis , history of PE, hypothyroidism, and hypertension   Clinical Impression   Pt received as handoff from PT eval; daughter present throughout and attentive. [name] home 24/7 and can provide necessary level of assistance. OT educated on falls prevention strategies for bathroom (grab bars, hand held shower head, non slip adhesives, motion sensor lights), use of AE for ADL independence and discussed sensory-based strategies to assist pt with hydration/nutrition intake as pt does not enjoy many varieties of foods due to texture/taste. Recommended that pt + daughter seek consult with dietitian for further support and specific nutritional guidelines for pt. Pt and daughter verbalizing understanding. Pt is safe to discharge home with daughter, [facility] OT recommended. OT will continue to follow to increase independence in ADLs and mobility for safe discharge.       If plan is discharge home, recommend the following: A little help with walking and/or transfers;A little help with bathing/dressing/bathroom;Direct supervision/assist for medications management;Direct supervision/assist for financial management;Assist for transportation;Supervision due to cognitive status    Functional Status Assessment  Patient has had a recent decline in their functional status and demonstrates the ability to make significant improvements in function in a reasonable and predictable amount of time.  Equipment Recommendations  None recommended by OT    Recommendations for Other Services Other (comment) (Dietitian consult)     Precautions / Restrictions Precautions Precautions: Fall Precaution  Comments: 1 fall in past 6 months (slid out of bed) Restrictions Weight Bearing Restrictions Per Provider Order: No      Mobility Bed Mobility                    Transfers Overall transfer level: Needs assistance Equipment used: Rolling walker (2 wheels) Transfers: Sit to/from Stand Sit to Stand: Min assist           General transfer comment: Min A to power up      Balance Overall balance assessment: Needs assistance Sitting-balance support: Feet supported, No upper extremity supported Sitting balance-Leahy Scale: Good     Standing balance support: Bilateral upper extremity supported, During functional activity, Reliant on assistive device for balance Standing balance-Leahy Scale: Fair                             ADL either performed or assessed with clinical judgement   ADL Overall ADL's : Needs assistance/impaired Eating/Feeding: Independent;Sitting Eating/Feeding Details (indicate cue type and reason): observed to eat lunch with setup seated in recliner Grooming: Sitting;Set up   Upper Body Bathing: Contact guard assist;Sitting   Lower Body Bathing: Contact guard assist;Sit to/from stand   Upper Body Dressing : Set up;Sitting       Toilet Transfer: Contact guard assist;Ambulation   Toileting- Clothing Manipulation and Hygiene: Contact guard assist;Sit to/from stand       Functional mobility during ADLs: Contact guard assist        Pertinent Vitals/Pain Pain Assessment Pain Assessment: No/denies pain     Extremity/Trunk Assessment Upper Extremity Assessment Upper Extremity Assessment: Overall WFL for tasks assessed   Lower Extremity Assessment Lower Extremity Assessment: Overall WFL for tasks assessed   Cervical / Trunk Assessment Cervical / Trunk Assessment: Kyphotic  Communication Communication Communication: No apparent difficulties   Cognition Arousal: Alert Behavior During Therapy: WFL for tasks  assessed/performed Overall Cognitive Status: Within Functional Limits for tasks assessed                                                  Home Living Family/patient expects to be discharged to:: Private residence Living Arrangements: Children Available Help at Discharge: Family;Available 24 hours/day Type of Home: House Home Access: Stairs to enter [employer] of Steps: 2   Home Layout: Two level;Bed/bath upstairs Alternate Level Stairs-Number of Steps: stairs, landing, stairs   Bathroom Shower/Tub: Walk-in shower;Door   [employer] Toilet: Handicapped height Bathroom Accessibility: Yes   Home Equipment: [employer] (2 wheels);Shower seat;Hand held shower head;Grab bars - toilet;Grab bars - tub/shower;Cane - single point;Rollator (4 wheels)          Prior Functioning/Environment Prior Level of Function : Needs assist  Cognitive Assist : ADLs (cognitive)           Mobility Comments: Modified independent with cane primarily ADLs Comments: daughter assists with some ADLs (min) and IADLs        OT Problem List: Decreased activity tolerance;Impaired balance (sitting and/or standing)      OT Treatment/Interventions: Self-care/ADL training;Energy conservation;Therapeutic exercise;DME and/or AE instruction;Therapeutic activities;Patient/family education;Balance training    OT Goals(Current goals can be found in the care plan section) Acute Rehab OT Goals OT Goal Formulation: With patient Time For Goal Achievement: 07/24/23 Potential to Achieve Goals: Good  OT Frequency: Min 1X/week       AM-PAC OT 6 Clicks Daily Activity     Outcome Measure Help from another person eating meals?: None Help from another person taking care of personal grooming?: None Help from another person toileting, which includes using toliet, bedpan, or urinal?: A Little Help from another person bathing (including washing, rinsing, drying)?: A Little Help from  another person to put on and taking off regular upper body clothing?: None Help from another person to put on and taking off regular lower body clothing?: None 6 Click Score: 22   End of Session Equipment Utilized During Treatment: Gait belt;Rolling walker (2 wheels)  Activity Tolerance: Patient tolerated treatment well Patient left: in chair;with call bell/phone within reach;with family/visitor present  OT Visit Diagnosis: Unsteadiness on feet (R26.81);Muscle weakness (generalized) (M62.81)                Time: 8686-8652 OT Time Calculation (min): 34 min Charges:  OT General Charges $OT Visit: 1 Visit OT Evaluation $OT Eval Low Complexity: 1 Low OT Treatments $Self Care/Home Management : 8-22 mins Icker Swigert L. Costella Schwarz, OTR/L  07/10/23, 4:05 PM

## 2023-07-10 NOTE — Care Management Obs Status (Signed)
 MEDICARE OBSERVATION STATUS NOTIFICATION   Patient Details  Name: Brenda Stephens MRN: [id number] Date of Birth: Nov 02, 1940   Medicare Observation Status Notification Given:  Yes    Otelia Santee, LCSW 07/10/2023, 2:35 PM

## 2023-07-10 NOTE — Discharge Summary (Signed)
 Physician Discharge Summary   Patient: Brenda Stephens MRN: [id number] DOB: May 07, 1941  Admit date:     07/09/2023  Discharge date: 07/10/23  Discharge Physician: Alejandro Marker, DO   PCP: Loris Elsie PARAS, PA-C   Recommendations at discharge:   Follow-up with PCP within 1 to 2 weeks repeat CBC, CMP, mag, Phos within 1 week Follow-up with Gastroenterology in outpatient setting with Dr. Oliva Boots within 1 to 2 weeks and continue to monitor for signs and symptoms of lower GI bleeding  Discharge Diagnoses: Principal Problem:   Rectal bleed Active Problems:   History of DVT of lower extremity   Atrial fibrillation with RVR (HCC)   Current use of long term anticoagulation  Resolved Problems:   * No resolved hospital problems. [facility] Course: HPI per Dr. Will Bernard on 07/09/23  Brenda Stephens is a 83 y.o. female with medical history significant for mild dementia, history of atrial fibrillation on Eliquis , history of PE, hypothyroidism, and hypertension who was brought in by her daughter after an episode of nearly passing out.  My history comes from the emergency department notes as the patient dated different story.  Apparently the patient was going out to lunch.  Her daughter was driving the car and when the patient went to get out of the car she became extremely lightheaded and weak.  She did not fall or hit the ground the daughter was able to get her back into the car and that is when she brought her to the emergency department.  The patient told me that she a rough day yesterday she was shaky with tremors all day long.  She also reports that yesterday she saw blood in her stool in the morning time.  She reports that she has abdominal pain after eating and that her stomach aches all the time.  She periodically does see blood in her stool.  The patient did not mention the episode of lightheadedness earlier today. Emergency department department evaluation revealed a pool of blood  inside the rectum.  Hemoglobin was 11.0 today and was 11.6 four months ago.  Follows with [facility] regularly because of her chronic pain with eating and abdominal pain.  They were called to the emergency department and the hospitalist will be admitting the patient for GI bleed.  **Interim History GI was consulted and they evaluated and her bleeding is slowed down and they recommended a CT scan of the abdomen pelvis which was done which showed no overt bleeding.  It did show some diverticulosis without diverticulitis and she was found to have a new L1 compression fracture which is new from last year but no back pain.  Hemoglobin stayed relatively stable and she is deemed medically stable for discharge and her renal function improved.  Given her improvement GI recommended outpatient follow-up with Dr. Oneil Boots  Assessment and Plan:  Bright red blood per rectum and low rectal bleeding Normocytic Anemia  - [facility] consulted and given that the blood in her stool is mild and intermittent dating by the last 10 years she is known to have hemorrhoids and diverticulosis. -Hgb/Hct Trend: Recent Labs  Lab 07/09/23 1353 07/10/23 0556  HGB 11.0* 10.5*  HCT 34.4* 34.3*  MCV 84.1 87.9  -Hemoglobin is relatively stable and GI feels that we can place her back on her anticoagulation advance diet and she tolerated this -CT scan of the abdomen pelvis was done and was negative for GI bleed and GI recommends outpatient follow-up -  PT OT recommended home health   History of A-fib with RVR  -Her anticoagulation was held but now has resumed -Will continue metoprolol  tartrate and monitored on telemetry   Mild Dementia -Resumed home medications and was placed on delirium precautions   AKI, improved  -BUN/Cr Trend: Recent Labs  Lab 07/09/23 1353 07/10/23 0556  BUN 14 21  CREATININE 1.25* 0.92  -Avoid Nephrotoxic Medications, Contrast Dyes, Hypotension and Dehydration to Ensure Adequate Renal Perfusion and  will need to Renally Adjust Meds -Continue to Monitor and Trend Renal Function carefully and repeat CMP in the outpatient setting within 1 week  Class I Obesity -Complicates overall prognosis and care -Estimated body mass index is 33.82 kg/m as calculated from the following:   Height as of this encounter: 5' 3 (1.6 m).   Weight as of this encounter: 86.6 kg.  -Weight Loss and Dietary Counseling given  Consultants: Gastroenterology Procedures performed: As above Disposition: Home health Diet recommendation:  Discharge Diet Orders (From admission, onward)     Start     Ordered   07/10/23 0000  Diet - low sodium heart healthy        07/10/23 1348           Cardiac diet DISCHARGE MEDICATION: Allergies as of 07/10/2023       Reactions   Latex Rash   Sulfa  Antibiotics Anaphylaxis, Nausea Only   Aspirin  Other (See Comments)   GI bleeding   Nsaids Other (See Comments)   GI upset    Oxycodone -acetaminophen  Nausea Only        Medication List     STOP taking these medications    amLODipine  10 MG tablet Commonly known as: NORVASC    NexIUM 24HR 20 MG capsule Generic drug: esomeprazole       TAKE these medications    acetaminophen  650 MG CR tablet Commonly known as: TYLENOL  Take 1 tablet (650 mg total) by mouth every 8 (eight) hours as needed for pain (headaches). What changed:  how much to take reasons to take this   albuterol  108 (90 Base) MCG/ACT inhaler Commonly known as: VENTOLIN  HFA Inhale 2 puffs into the lungs every 6 (six) hours as needed for wheezing or shortness of breath.   apixaban  5 MG Tabs tablet Commonly known as: ELIQUIS  Take 1 tablet (5 mg total) by mouth 2 (two) times daily.   benzonatate  200 MG capsule Commonly known as: TESSALON  TAKE 1 CAPSULE (200 MG TOTAL) BY MOUTH 3 (THREE) TIMES DAILY AS NEEDED FOR COUGH.   Biotin  10000 MCG Tabs Take 10,000 mcg by mouth daily.   cetirizine 10 MG tablet Commonly known as: ZYRTEC Take 10 mg by  mouth at bedtime.   dicyclomine  20 MG tablet Commonly known as: BENTYL  Take 20 mg by mouth every 6 (six) hours.   donepezil  10 MG tablet Commonly known as: ARICEPT  Take 10 mg by mouth at bedtime.   DULoxetine  60 MG capsule Commonly known as: CYMBALTA  Take 120 mg by mouth daily.   furosemide  20 MG tablet Commonly known as: LASIX  TAKE 1 TABLET BY MOUTH EVERY DAY   hydrOXYzine  25 MG tablet Commonly known as: ATARAX  Take 1 tablet (25 mg total) by mouth every 6 (six) hours as needed for anxiety.   levothyroxine  25 MCG tablet Commonly known as: SYNTHROID  Take 12.5 mcg by mouth daily before breakfast.   meclizine  25 MG tablet Commonly known as: ANTIVERT  Take 1 tablet (25 mg total) by mouth 3 (three) times daily as needed for dizziness.  What changed: reasons to take this   memantine  10 MG tablet Commonly known as: NAMENDA  Take 10 mg by mouth 2 (two) times daily.   metoprolol  succinate 50 MG 24 hr tablet Commonly known as: Toprol  XL Take 1 tablet (50 mg total) by mouth at bedtime.   nitroGLYCERIN  0.4 MG SL tablet Commonly known as: NITROSTAT  Place 0.4 mg under the tongue as needed.   ondansetron  4 MG disintegrating tablet Commonly known as: ZOFRAN -ODT Take 4 mg by mouth every 8 (eight) hours as needed for nausea or vomiting.   oxybutynin  5 MG tablet Commonly known as: DITROPAN  Take 5 mg by mouth 2 (two) times daily.   pantoprazole  40 MG tablet Commonly known as: PROTONIX  Take 1 tablet (40 mg total) by mouth daily.   potassium chloride  SA 20 MEQ tablet Commonly known as: KLOR-CON  M Take 1 tablet (20 mEq total) by mouth daily.   QUEtiapine  50 MG tablet Commonly known as: SEROQUEL  Take 50 mg by mouth at bedtime.   sennosides-docusate sodium  8.6-50 MG tablet Commonly known as: SENOKOT-S Take 1 tablet by mouth daily as needed for constipation.   SYSTANE OP Place 1 drop into both eyes 2 (two) times daily as needed (for dryness).   tamsulosin  0.4 MG Caps  capsule Commonly known as: FLOMAX  Take 0.4 mg by mouth at bedtime.   valsartan  80 MG tablet Commonly known as: DIOVAN  Take 80 mg by mouth daily.   venlafaxine  XR 150 MG 24 hr capsule Commonly known as: EFFEXOR -XR TAKE 1 CAPSULE (150 MG TOTAL) BY MOUTH DAILY.        Follow-up Information     [facility] Follow up.   Specialty: Home Health Services Why: [facility] will follow up with you at discharge to provide home health physical therapy Contact information: [address] [place] [address] [phone]                Discharge Exam: [facility] Weights   07/09/23 1900 07/09/23 2007  Weight: 86.6 kg 86.6 kg   Vitals:   07/10/23 0621 07/10/23 1359  BP: (!) 150/100 (!) 136/52  Pulse: 85 99  Resp:  16  Temp: 98.1 F (36.7 C) 98.1 F (36.7 C)  SpO2: 97% 98%   Examination: Physical Exam:  Constitutional: WN/WD Caucasian female in no acute distress Respiratory: Diminished to auscultation bilaterally, no wheezing, rales, rhonchi or crackles. Normal respiratory effort and patient is not tachypenic. No accessory muscle use.  Unlabored breathing Cardiovascular: RRR, no murmurs / rubs / gallops. S1 and S2 auscultated. No extremity edema Abdomen: Soft, non-tender, distended secondary to body habitus. Bowel sounds positive.  GU: Deferred. Musculoskeletal: No clubbing / cyanosis of digits/nails. No joint deformity upper and lower extremities.  Skin: No rashes, lesions, ulcers limited skin evaluation. No induration; Warm and dry.  Neurologic: CN 2-12 grossly intact with no focal deficits. Romberg sign and cerebellar reflexes not assessed.  Psychiatric: Normal judgment and insight. Alert and oriented x 3. Normal mood and appropriate affect.   Condition at discharge: stable  The results of significant diagnostics from this hospitalization (including imaging, microbiology, ancillary and laboratory) are listed below for reference.   Imaging Studies: CT  ANGIO ABDOMEN PELVIS  W &/OR WO CONTRAST Result Date: 07/10/2023 CLINICAL DATA:  Lower GI bleed EXAM: CTA ABDOMEN AND PELVIS WITHOUT AND WITH CONTRAST TECHNIQUE: Multidetector CT imaging of the abdomen and pelvis was performed using the standard protocol during bolus administration of intravenous contrast. Multiplanar reconstructed images and MIPs  were obtained and reviewed to evaluate the vascular anatomy. RADIATION DOSE REDUCTION: This exam was performed according to the departmental dose-optimization program which includes automated exposure control, adjustment of the mA and/or kV according to patient size and/or use of iterative reconstruction technique. CONTRAST:  OMNIPAQUE  IOHEXOL  350 MG/ML SOLN COMPARISON:  CT chest 06/30/2022, CT abdomen 09/11/2021 FINDINGS: VASCULAR Aorta: Moderate calcified atheromatous plaque. Some eccentric nonocclusive mural thrombus in the infrarenal segment. No aneurysm, dissection, or significant stenosis. Celiac: Patent without evidence of aneurysm, dissection, vasculitis or significant stenosis. SMA: Patent without evidence of aneurysm, dissection, vasculitis or significant stenosis. Renals: Single bilaterally, both patent. IMA: Patent without evidence of aneurysm, dissection, vasculitis or significant stenosis. Inflow: Scattered nonocclusive calcified plaque in bilateral common iliac and right internal iliac arteries. External iliac arteries normal. Proximal Outflow: Mild nonocclusive plaque, patent. Veins: Patent hepatic veins, portal vein, SM V, splenic vein, bilateral renal veins, iliac venous system and IVC. Review of the MIP images confirms the above findings. NON-VASCULAR Lower chest: No pleural or pericardial effusion. Scattered coronary calcifications. Scattered small bilateral lower lobe pulmonary nodules, many calcified, stable since previous, suggesting old granulomatous disease. Hepatobiliary: Stable subcentimeter probable cyst in hepatic segment 4B. Post  cholecystectomy. No new liver lesion or intrahepatic biliary ductal dilatation. Ectatic CBD stable. Pancreas: Unremarkable. No pancreatic ductal dilatation or surrounding inflammatory changes. Spleen: Normal in size without focal abnormality. Adrenals/Urinary Tract: No adrenal mass. No urolithiasis or hydronephrosis. Ptotic right kidney. Urinary bladder incompletely distended. Stomach/Bowel: No active GI bleed. Stomach is nondistended, unremarkable. Small bowel decompressed. Lipomatous ileocecal valve.Post appendectomy. Colon is partially distended, with scattered sigmoid diverticula; no adjacent inflammatory change. Lymphatic: No abdominal or pelvic adenopathy. Reproductive: Status post hysterectomy. No adnexal masses. Other: Bilateral pelvic phleboliths.  No ascites.  No free air. Musculoskeletal: Small paraumbilical hernia containing only mesenteric fat. L1 vertebral compression fracture, new since prior study, involving the superior endplate, with less than 20% loss of height. No posterior element involvement. Degenerative disc disease in the visualized lower thoracic spine and L2-S1. IMPRESSION: 1. No active GI bleed. 2. Sigmoid diverticulosis. 3.   L1 vertebral compression fracture, new since 06/30/2022. 4.  Aortic Atherosclerosis (ICD10-I70.0). Electronically Signed   By: JONETTA Faes M.D.   On: 07/10/2023 13:37   Microbiology: Results for orders placed or performed during the hospital encounter of 06/30/22  Resp panel by RT-PCR (RSV, Flu A&B, Covid) Anterior Nasal Swab     Status: None   Collection Time: 06/30/22 12:48 AM   Specimen: Anterior Nasal Swab  Result Value Ref Range Status   SARS Coronavirus 2 by RT PCR NEGATIVE NEGATIVE Final    Comment: (NOTE) SARS-CoV-2 target nucleic acids are NOT DETECTED.  The SARS-CoV-2 RNA is generally detectable in upper respiratory specimens during the acute phase of infection. The lowest concentration of SARS-CoV-2 viral copies this assay can detect is 138  copies/mL. A negative result does not preclude SARS-Cov-2 infection and should not be used as the sole basis for treatment or other patient management decisions. A negative result may occur with  improper specimen collection/handling, submission of specimen other than nasopharyngeal swab, presence of viral mutation(s) within the areas targeted by this assay, and inadequate number of viral copies(<138 copies/mL). A negative result must be combined with clinical observations, patient history, and epidemiological information. The expected result is Negative.  Fact Sheet for Patients:  bloggercourse.com  Fact Sheet for Healthcare Providers:  seriousbroker.it  This test is no t yet approved or cleared by the [place]   FDA and  has been authorized for detection and/or diagnosis of SARS-CoV-2 by FDA under an Emergency Use Authorization (EUA). This EUA will remain  in effect (meaning this test can be used) for the duration of the COVID-19 declaration under Section 564(b)(1) of the Act, 21 U.S.C.section 360bbb-3(b)(1), unless the authorization is terminated  or revoked sooner.       Influenza A by PCR NEGATIVE NEGATIVE Final   Influenza B by PCR NEGATIVE NEGATIVE Final    Comment: (NOTE) The Xpert Xpress SARS-CoV-2/FLU/RSV plus assay is intended as an aid in the diagnosis of influenza from Nasopharyngeal swab specimens and should not be used as a sole basis for treatment. Nasal washings and aspirates are unacceptable for Xpert Xpress SARS-CoV-2/FLU/RSV testing.  Fact Sheet for Patients: bloggercourse.com  Fact Sheet for Healthcare Providers: seriousbroker.it  This test is not yet approved or cleared by the [place]  FDA and has been authorized for detection and/or diagnosis of SARS-CoV-2 by FDA under an Emergency Use Authorization (EUA). This EUA will remain in effect (meaning  this test can be used) for the duration of the COVID-19 declaration under Section 564(b)(1) of the Act, 21 U.S.C. section 360bbb-3(b)(1), unless the authorization is terminated or revoked.     Resp Syncytial Virus by PCR NEGATIVE NEGATIVE Final    Comment: (NOTE) Fact Sheet for Patients: bloggercourse.com  Fact Sheet for Healthcare Providers: seriousbroker.it  This test is not yet approved or cleared by the [place]  FDA and has been authorized for detection and/or diagnosis of SARS-CoV-2 by FDA under an Emergency Use Authorization (EUA). This EUA will remain in effect (meaning this test can be used) for the duration of the COVID-19 declaration under Section 564(b)(1) of the Act, 21 U.S.C. section 360bbb-3(b)(1), unless the authorization is terminated or revoked.  Performed at [facility], [address], [place], [place] [address]   MRSA Next Gen by PCR, Nasal     Status: None   Collection Time: 07/01/22  2:45 PM   Specimen: Nasal Mucosa; Nasal Swab  Result Value Ref Range Status   MRSA by PCR Next Gen NOT DETECTED NOT DETECTED Final    Comment: (NOTE) The GeneXpert MRSA Assay (FDA approved for NASAL specimens only), is one component of a comprehensive MRSA colonization surveillance program. It is not intended to diagnose MRSA infection nor to guide or monitor treatment for MRSA infections. Test performance is not FDA approved in patients less than 28 years old. Performed at [facility], [address], [place], [place] [address]    Labs: CBC: Recent Labs  Lab 07/09/23 1353 07/10/23 0556  WBC 7.3 7.8  NEUTROABS 5.1  --   HGB 11.0* 10.5*  HCT 34.4* 34.3*  MCV 84.1 87.9  PLT 314 307   Basic Metabolic Panel: Recent Labs  Lab 07/09/23 1353 07/10/23 0556  NA 137 136  K 4.0 3.8  CL 101 104  CO2 24 23  GLUCOSE 118* 140*  BUN 14 21  CREATININE 1.25* 0.92  CALCIUM  9.5  9.2   Liver Function Tests: Recent Labs  Lab 07/09/23 1353  AST 17  ALT 11  ALKPHOS 106  BILITOT 0.5  PROT 7.2  ALBUMIN 4.0   CBG: No results for input(s): GLUCAP in the last 168 hours.  Discharge time spent: greater than 30 minutes.  Signed: Alejandro Marker, DO [facility] 07/12/2023

## 2023-07-10 NOTE — Progress Notes (Signed)
 Mobility Specialist - Progress Note   07/10/23 1200  Mobility  Activity Ambulated with assistance in hallway  Level of Assistance Contact guard assist, steadying assist  Assistive Device Cane  Distance Ambulated (ft) 30 ft  Range of Motion/Exercises Active  Activity Response Tolerated well  Mobility Referral Yes  Mobility visit 1 Mobility  Mobility Specialist Start Time (ACUTE ONLY) 1150  Mobility Specialist Stop Time (ACUTE ONLY) 1200  Mobility Specialist Time Calculation (min) (ACUTE ONLY) 10 min   Pt was found in bed and agreeable to ambulate. No complaints with session. At EOS was left with transporter to head to CT.  Brenda Stephens Mobility Specialist

## 2023-07-10 NOTE — Plan of Care (Signed)

## 2023-07-10 NOTE — TOC Transition Note (Signed)
 Transition of Care [facility]) - Discharge Note   Patient Details  Name: Brenda Stephens MRN: [id number] Date of Birth: 06/14/1941  [facility]) CM/SW Contact:  Hoy DELENA Bigness, LCSW Phone Number: 07/10/2023, 2:37 PM   Clinical Narrative:    Pt to return home with daughter. Confirmed plan for [facility] services. Pt has had HH w/ [facility] in the past however, would like to try services through a different agency. HHPT arranged with [facility]. Daughter to transport pt home. No DME needs.    Final next level of care: Home w Home Health Services Barriers to Discharge: No Barriers Identified   Patient Goals and CMS Choice Patient states their goals for this hospitalization and ongoing recovery are:: To go home CMS Medicare.gov Compare Post Acute Care list provided to:: Patient Choice offered to / list presented to : Patient [place] ownership interest in [facility].provided to::  (NA)    Discharge Placement                       Discharge Plan and Services Additional resources added to the After Visit Summary for                  DME Arranged: N/A DME Agency: NA       HH Arranged: PT HH Agency: [facility] Date [facility] Agency Contacted: 07/10/23 Time HH Agency Contacted: 1437 Representative spoke with at [facility] Agency: Cindie  Social Drivers of Health (SDOH) Interventions SDOH Screenings   Food Insecurity: No Food Insecurity (07/09/2023)  Housing: Low Risk  (07/09/2023)  Transportation Needs: No Transportation Needs (07/09/2023)  Utilities: Not At Risk (07/09/2023)  Financial Resource Strain: Low Risk  (03/12/2021)   Received from [facility] visits prior to 08/30/2022., [facility] visits prior to 08/30/2022.  Physical Activity: Inactive (03/12/2021)   Received from [facility] visits prior to 08/30/2022., [facility] visits prior to 08/30/2022.  Social Connections: Unknown  (07/09/2023)  Stress: Stress Concern Present (03/12/2021)   Received from [facility] visits prior to 08/30/2022., [facility] visits prior to 08/30/2022.  Tobacco Use: Medium Risk (07/09/2023)     Readmission Risk Interventions     No data to display

## 2023-07-10 NOTE — Hospital Course (Addendum)
 HPI per Dr. Will Bernard on 07/09/23  Brenda Stephens is a 83 y.o. female with medical history significant for mild dementia, history of atrial fibrillation on Eliquis , history of PE, hypothyroidism, and hypertension who was brought in by her daughter after an episode of nearly passing out.  My history comes from the emergency department notes as the patient dated different story.  Apparently the patient was going out to lunch.  Her daughter was driving the car and when the patient went to get out of the car she became extremely lightheaded and weak.  She did not fall or hit the ground the daughter was able to get her back into the car and that is when she brought her to the emergency department.  The patient told me that she a rough day yesterday she was shaky with tremors all day long.  She also reports that yesterday she saw blood in her stool in the morning time.  She reports that she has abdominal pain after eating and that her stomach aches all the time.  She periodically does see blood in her stool.  The patient did not mention the episode of lightheadedness earlier today. Emergency department department evaluation revealed a pool of blood inside the rectum.  Hemoglobin was 11.0 today and was 11.6 four months ago.  Follows with [facility] regularly because of her chronic pain with eating and abdominal pain.  They were called to the emergency department and the hospitalist will be admitting the patient for GI bleed.  **[facility] was consulted and they evaluated and her bleeding is slowed down and they recommended a CT scan of the abdomen pelvis which was done which showed no overt bleeding.  It did show some diverticulosis without diverticulitis and she was found to have a new L1 compression fracture which is new from last year but no back pain.  Hemoglobin stayed relatively stable and she is deemed medically stable for discharge and her renal function improved.  Given her improvement GI  recommended outpatient follow-up with Dr. Oneil Boots  Assessment and Plan:  Bright red blood per rectum and low rectal bleeding Normocytic Anemia  - [facility] consulted and given that the blood in her stool is mild and intermittent dating by the last 10 years she is known to have hemorrhoids and diverticulosis. -Hgb/Hct Trend: Recent Labs  Lab 07/09/23 1353 07/10/23 0556  HGB 11.0* 10.5*  HCT 34.4* 34.3*  MCV 84.1 87.9  -Hemoglobin is relatively stable and GI feels that we can place her back on her anticoagulation advance diet and she tolerated this -CT scan of the abdomen pelvis was done and was negative for GI bleed and GI recommends outpatient follow-up -PT OT recommended home health   History of A-fib with RVR  -Her anticoagulation was held but now has resumed -Will continue metoprolol  tartrate and monitored on telemetry   Mild Dementia -Resumed home medications and was placed on delirium precautions   AKI, improved  -BUN/Cr Trend: Recent Labs  Lab 07/09/23 1353 07/10/23 0556  BUN 14 21  CREATININE 1.25* 0.92  -Avoid Nephrotoxic Medications, Contrast Dyes, Hypotension and Dehydration to Ensure Adequate Renal Perfusion and will need to Renally Adjust Meds -Continue to Monitor and Trend Renal Function carefully and repeat CMP in the outpatient setting within 1 week  Class I Obesity -Complicates overall prognosis and care -Estimated body mass index is 33.82 kg/m as calculated from the following:   Height as of this encounter: 5' 3 (1.6 m).  Weight as of this encounter: 86.6 kg.  -Weight Loss and Dietary Counseling given

## 2023-07-23 NOTE — Progress Notes (Deleted)
Cardiology Office Note:   Date:  07/23/2023  ID:  Brenda Stephens, Brenda Stephens [birth year], MRN [id number] PCP: Brenda Stephens  [facility] Providers Cardiologist:  Rollene Rotunda, MD {  History of Present Illness:   Brenda Stephens is a 83 y.o. female  02/12/2023 after being evaluated outpatient and found to have new onset atrial fibrillation/flutter with RVR.  Initially she presented with shortness of breath and chest pressure thought related to her RVR.  She had minimally elevated troponins that were flat.  During admission she spontaneously converted to normal sinus rhythm on Cardizem drip.  She also had some issues with volume and was given IV Lasix 40 mg twice daily however that was titrated down at the last days of her admission.  Patient also had poorly controlled hypertension so amlodipine was increased to 7.5 mg.  She was in back in the hospital in Sept due to recurrent episodes of chest pressure .  She has a history of a PE.    Since I last saw her she is in the hospital with bright red blood per rectum in Jan 2025.  This was thought possibly to be related to hemorrhoids or diverticulosis.  Anticoagulation was held for short while but she was cleared to restart this.  She had some renal insufficiency which improved with a creatinine peaking at 1.25 and 0.92.  She had some chest pain which is felt to be noncardiac.  This was in Sept 2024.    ***  ***since her PE without any associated symptoms such as radiation, nausea, diaphoresis.  She also reports sometimes these episodes occur after eating food.  She does note some shortness of breath with this episodes and decided to seek emergency evaluation however, she does have asthma.  Her daughter is accompanied with her today who feels that much of her chest pressure is likely related to her anxiety.  She had a cardiology appointment today and seem to be anxious for this.  At the bedside she looks extremely tense.  Blood pressure is  elevated in the 160s.  She denies any other significant symptoms such as peripheral edema, dizziness, palpitations.  Patient would like to go home.   Overall workup has been unremarkable.  EKG without any acute ischemic ST-T wave changes.  Troponins negative x 2.  Negative chest x-ray.  Had mild AKI last admission however has normalized and has normal renal function today.   ROS: ***  Studies Reviewed:    EKG:       ***  Risk Assessment/Calculations:   {Does this patient have ATRIAL FIBRILLATION?:[id number]} No BP recorded.  {Refresh Note OR Click here to enter BP  :1}***        Physical Exam:   VS:  There were no vitals taken for this visit.   Wt Readings from Last 3 Encounters:  07/09/23 190 lb 14.7 oz (86.6 kg)  04/27/23 191 lb (86.6 kg)  02/17/23 198 lb 13.7 oz (90.2 kg)     GEN: Well nourished, well developed in no acute distress NECK: No JVD; No carotid bruits CARDIAC: ***RR, *** murmurs, rubs, gallops RESPIRATORY:  Clear to auscultation without rales, wheezing or rhonchi  ABDOMEN: Soft, non-tender, non-distended EXTREMITIES:  No edema; No deformity   ASSESSMENT AND PLAN:   LEG SWELLING:  ***   Her legs and abdomen are mildly swollen.  I am to give her Lasix 20 mg for 4 to 5 days.  If this helps I might continue this.  She will let me know whether she wants to continue this at which point she would need follow-up labs.  If she has any continued swelling I would have a low threshold for repeat echocardiography.  ATRIAL FIB:  ***    PALPITATIONS: She has some sinus tachycardia.  ***  I like to reduce her Diovan as her blood pressure is marginal and add a little bit of metoprolol XL 25 mg daily.  She was on beta-blockers in the past but does not recall why this was stopped.  I do not see a contraindication or allergy.   HTN:    Her blood pressure is *** at target and I am going to manage it as above.    PULMONARY EMBOLI: She is going to continue anticoagulation I would  suggest that she be on at least a maintenance dose after she is on the acute therapy.  Her risk factors for recurrent pulmonary emboli are ongoing.    AORTIC ROOT ENLARGEMENT: This was 40 mm.  ***  I will follow this conservatively.   CHEST PAIN:   ***  She has a non anginal sounding chest discomfort and I am not suggesting further cardiovascular testing.           Follow up ***  Signed, Rollene Rotunda, MD

## 2023-07-24 ENCOUNTER — Ambulatory Visit: Payer: MEDICARE | Admitting: Cardiology

## 2023-07-24 DIAGNOSIS — R072 Precordial pain: Secondary | ICD-10-CM

## 2023-07-24 DIAGNOSIS — R002 Palpitations: Secondary | ICD-10-CM

## 2023-07-24 DIAGNOSIS — I10 Essential (primary) hypertension: Secondary | ICD-10-CM

## 2023-07-24 DIAGNOSIS — I4891 Unspecified atrial fibrillation: Secondary | ICD-10-CM

## 2023-07-30 ENCOUNTER — Encounter: Payer: Self-pay | Admitting: Cardiology

## 2023-08-03 ENCOUNTER — Other Ambulatory Visit: Payer: Self-pay | Admitting: Physician Assistant

## 2023-08-03 DIAGNOSIS — M5186 Other intervertebral disc disorders, lumbar region: Secondary | ICD-10-CM

## 2023-08-03 DIAGNOSIS — M4316 Spondylolisthesis, lumbar region: Secondary | ICD-10-CM

## 2023-08-03 DIAGNOSIS — M4312 Spondylolisthesis, cervical region: Secondary | ICD-10-CM

## 2023-08-03 DIAGNOSIS — S32010A Wedge compression fracture of first lumbar vertebra, initial encounter for closed fracture: Secondary | ICD-10-CM

## 2023-08-15 ENCOUNTER — Other Ambulatory Visit: Payer: Self-pay | Admitting: Cardiology

## 2023-08-24 ENCOUNTER — Other Ambulatory Visit: Payer: MEDICARE

## 2023-09-09 ENCOUNTER — Other Ambulatory Visit: Payer: Self-pay | Admitting: Internal Medicine

## 2023-09-09 ENCOUNTER — Other Ambulatory Visit: Payer: Self-pay | Admitting: Cardiology

## 2023-09-10 NOTE — Telephone Encounter (Signed)
 Interface request from [facility] refill for Ventolin HFA.  Patient last OV 08/25/2022 with Dr. Maple Hudson.  Per last OV note:   Instructions   Return in about 6 months (around 02/23/2023). Script sent refilling albuterol hfa inhaler      Will give one refill.  Patient needs OV for further refills.  Called patient and left VM for patient to make one year follow up OV.  Sent in refill.

## 2023-09-18 ENCOUNTER — Other Ambulatory Visit: Payer: Self-pay

## 2023-09-18 ENCOUNTER — Emergency Department (HOSPITAL_COMMUNITY): Payer: MEDICARE

## 2023-09-18 ENCOUNTER — Emergency Department (HOSPITAL_COMMUNITY)
Admission: EM | Admit: 2023-09-18 | Discharge: 2023-09-18 | Disposition: A | Discharge status: Home / Self Care | Payer: MEDICARE | Attending: Emergency Medicine | Admitting: Emergency Medicine

## 2023-09-18 DIAGNOSIS — E039 Hypothyroidism, unspecified: Secondary | ICD-10-CM | POA: Insufficient documentation

## 2023-09-18 DIAGNOSIS — I4891 Unspecified atrial fibrillation: Secondary | ICD-10-CM | POA: Insufficient documentation

## 2023-09-18 DIAGNOSIS — R109 Unspecified abdominal pain: Secondary | ICD-10-CM | POA: Insufficient documentation

## 2023-09-18 DIAGNOSIS — W19XXXA Unspecified fall, initial encounter: Secondary | ICD-10-CM | POA: Diagnosis not present

## 2023-09-18 DIAGNOSIS — F0390 Unspecified dementia without behavioral disturbance: Secondary | ICD-10-CM | POA: Insufficient documentation

## 2023-09-18 DIAGNOSIS — Z853 Personal history of malignant neoplasm of breast: Secondary | ICD-10-CM | POA: Insufficient documentation

## 2023-09-18 DIAGNOSIS — Z91040 Latex allergy status: Secondary | ICD-10-CM | POA: Insufficient documentation

## 2023-09-18 DIAGNOSIS — M546 Pain in thoracic spine: Secondary | ICD-10-CM | POA: Insufficient documentation

## 2023-09-18 DIAGNOSIS — M542 Cervicalgia: Secondary | ICD-10-CM | POA: Insufficient documentation

## 2023-09-18 DIAGNOSIS — Z79899 Other long term (current) drug therapy: Secondary | ICD-10-CM | POA: Insufficient documentation

## 2023-09-18 DIAGNOSIS — Z7901 Long term (current) use of anticoagulants: Secondary | ICD-10-CM | POA: Diagnosis not present

## 2023-09-18 DIAGNOSIS — I10 Essential (primary) hypertension: Secondary | ICD-10-CM | POA: Insufficient documentation

## 2023-09-18 LAB — URINALYSIS, W/ REFLEX TO CULTURE (INFECTION SUSPECTED)
Bacteria, UA: NONE SEEN
Bilirubin Urine: NEGATIVE
Glucose, UA: NEGATIVE mg/dL
Hgb urine dipstick: NEGATIVE
Ketones, ur: NEGATIVE mg/dL
Leukocytes,Ua: NEGATIVE
Nitrite: NEGATIVE
Protein, ur: NEGATIVE mg/dL
Specific Gravity, Urine: >1.046 — ABNORMAL HIGH (ref 1.005–1.030)
pH: 6 (ref 5.0–8.0)

## 2023-09-18 LAB — CBC WITH DIFFERENTIAL/PLATELET
Abs Immature Granulocytes: 0.06 10*3/uL (ref 0.00–0.07)
Basophils Absolute: 0.1 10*3/uL (ref 0.0–0.1)
Basophils Relative: 1 %
Eosinophils Absolute: 0.2 10*3/uL (ref 0.0–0.5)
Eosinophils Relative: 2 %
HCT: 32.6 % — ABNORMAL LOW (ref 36.0–46.0)
Hemoglobin: 10.1 g/dL — ABNORMAL LOW (ref 12.0–15.0)
Immature Granulocytes: 1 %
Lymphocytes Relative: 14 %
Lymphs Abs: 1.4 10*3/uL (ref 0.7–4.0)
MCH: 26.1 pg (ref 26.0–34.0)
MCHC: 31 g/dL (ref 30.0–36.0)
MCV: 84.2 fL (ref 80.0–100.0)
Monocytes Absolute: 0.9 10*3/uL (ref 0.1–1.0)
Monocytes Relative: 9 %
Neutro Abs: 7.5 10*3/uL (ref 1.7–7.7)
Neutrophils Relative %: 73 %
Platelets: 334 10*3/uL (ref 150–400)
RBC: 3.87 MIL/uL (ref 3.87–5.11)
RDW: 14 % (ref 11.5–15.5)
WBC: 10.1 10*3/uL (ref 4.0–10.5)
nRBC: 0 % (ref 0.0–0.2)

## 2023-09-18 LAB — COMPREHENSIVE METABOLIC PANEL
ALT: 17 U/L (ref 0–44)
AST: 25 U/L (ref 15–41)
Albumin: 3.4 g/dL — ABNORMAL LOW (ref 3.5–5.0)
Alkaline Phosphatase: 59 U/L (ref 38–126)
Anion gap: 16 — ABNORMAL HIGH (ref 5–15)
BUN: 17 mg/dL (ref 8–23)
CO2: 22 mmol/L (ref 22–32)
Calcium: 9.5 mg/dL (ref 8.9–10.3)
Chloride: 98 mmol/L (ref 98–111)
Creatinine, Ser: 1.48 mg/dL — ABNORMAL HIGH (ref 0.44–1.00)
GFR, Estimated: 35 mL/min — ABNORMAL LOW (ref >60)
Glucose, Bld: 160 mg/dL — ABNORMAL HIGH (ref 70–99)
Potassium: 4.1 mmol/L (ref 3.5–5.1)
Sodium: 136 mmol/L (ref 135–145)
Total Bilirubin: 0.4 mg/dL (ref 0.0–1.2)
Total Protein: 6.9 g/dL (ref 6.5–8.1)

## 2023-09-18 LAB — I-STAT CHEM 8, ED
BUN: 18 mg/dL (ref 8–23)
Calcium, Ion: 1.2 mmol/L (ref 1.15–1.40)
Chloride: 101 mmol/L (ref 98–111)
Creatinine, Ser: 1.5 mg/dL — ABNORMAL HIGH (ref 0.44–1.00)
Glucose, Bld: 148 mg/dL — ABNORMAL HIGH (ref 70–99)
HCT: 33 % — ABNORMAL LOW (ref 36.0–46.0)
Hemoglobin: 11.2 g/dL — ABNORMAL LOW (ref 12.0–15.0)
Potassium: 4.2 mmol/L (ref 3.5–5.1)
Sodium: 136 mmol/L (ref 135–145)
TCO2: 22 mmol/L (ref 22–32)

## 2023-09-18 LAB — LIPASE, BLOOD: Lipase: 27 U/L (ref 11–51)

## 2023-09-18 LAB — PROTIME-INR
INR: 1.5 — ABNORMAL HIGH (ref 0.8–1.2)
Prothrombin Time: 18.3 s — ABNORMAL HIGH (ref 11.4–15.2)

## 2023-09-18 MED ORDER — IOHEXOL 350 MG/ML SOLN
75.0000 mL | Freq: Once | INTRAVENOUS | Status: AC | PRN
  Administered 2023-09-18: 75 mL via INTRAVENOUS

## 2023-09-18 NOTE — ED Provider Notes (Signed)
 Received signout; pending CT abdomen and urine with discharge home if negative.  See overnight team's note for full HPI.  Patient reevaluated; no specific complaints.  CT scan negative.  Urine without evidence of urinary tract infection.  Vital signs reassuring.  Given patient's reassuring workup with no traumatic injury will discharge as planned by overnight team.   Coral Spikes, DO 09/18/23 [phone]

## 2023-09-18 NOTE — ED Notes (Signed)
 Daughter at bedside , discharge instructions given

## 2023-09-18 NOTE — Progress Notes (Signed)
   09/18/23 0543  Spiritual Encounters  Referral source Trauma page  Reason for visit Trauma  OnCall Visit Yes

## 2023-09-18 NOTE — Discharge Instructions (Signed)
 Please follow-up with your primary doctor.  You may take over-the-counter Tylenol as directed on the packaging for pain.  Return immediately if develop fevers, chills, sudden onset headache, passout, chest pain, shortness of breath, worsening abdominal pain, inability to eat or drink due to nausea vomiting or any new or worsening symptoms that are concerning to you.

## 2023-09-18 NOTE — ED Triage Notes (Signed)
 Patient brought in by EMS from home. Patient fell on the way to bathroom. Patient denies hitting her head. No LOC. Patient takes Eliquis for A-Fib. Patient alert and oriented x4.

## 2023-09-18 NOTE — ED Provider Notes (Signed)
 [place] EMERGENCY DEPARTMENT AT [facility] Provider Note  CSN: [id number] Arrival date & time: 09/18/23 4098  Chief Complaint(s) Fall  HPI Brenda Stephens is a 83 y.o. female with a past medical history listed below including chronic anticoagulation for prior DVT/PEs here after a fall at home.  Patient was reportedly walking to the bathroom when she fell.  She is unsure of why she fell.  She denied any associated lightheadedness, headache, chest pain, shortness of breath, dizziness.  She did not remember tripping or losing her balance.  She does report using cane at baseline while out of the house and intermittently uses the walls and furniture to get around the house.  She is endorsing chronic upper back and neck pain which may be slightly worse since the fall.  She denied any known head trauma.  Denied any LOC.  Denies any other acute pain.  She did report history of hemorrhoids and having bloody bowel movements over the past week with some abdominal discomfort.  HPI  Past Medical History Past Medical History:  Diagnosis Date   Acute deep vein thrombosis (DVT) of proximal vein of right lower extremity (HCC) 07/02/2022   Acute pulmonary embolism (HCC) 07/15/2022   Allergy    latex   Anemia    Anxiety    Arthritis    Bilateral pulmonary embolism (HCC) 06/30/2022   Cancer (HCC) 01/2017   right breast cancer   Depression    Dyslipidemia    Eustachian tube dysfunction    GERD (gastroesophageal reflux disease)    Grade I diastolic dysfunction 07/15/2022   Hx of cardiovascular stress test    Myoview 10/13:  apical thinning, EF 72%, no ischemia   Hx of colonic polyps    Dr Ewing Schlein   Hypertension    Hypothyroidism    Other abnormal glucose    Pulmonary embolism (HCC) 06/30/2022   Patient Active Problem List   Diagnosis Date Noted   Rectal bleed 07/09/2023   Current use of long term anticoagulation 07/09/2023   AKI (acute kidney injury) (HCC) 02/16/2023   Atrial  fibrillation with RVR (HCC) 02/12/2023   Rapid atrial fibrillation (HCC) 02/11/2023   History of pulmonary embolism 02/11/2023   History of DVT of lower extremity 02/11/2023   Grade I diastolic dysfunction 07/15/2022   Anxiety and depression 07/02/2022   Dementia without behavioral disturbance (HCC) 07/02/2022   HX: breast cancer 07/02/2022   Right lower lobe pulmonary nodule 10/24/2021   Palpitations 01/03/2021   Cerebrovascular disease 04/25/2020   DOE (dyspnea on exertion) 10/12/2019   Rotator cuff arthropathy, left 01/12/2019   Vertigo 02/04/2018   Leg swelling 12/17/2017   Asthmatic bronchitis, mild intermittent, uncomplicated 08/04/2017   Malignant neoplasm of upper-outer quadrant of right breast in female, estrogen receptor positive (HCC) 03/13/2017   Acute chest pain 05/10/2013   Fasting hyperglycemia 06/13/2011   Hypothyroidism 05/06/2010   Hyperlipidemia 05/06/2010   ARTHRITIS, SHOULDER 05/06/2010   FATIGUE, CHRONIC 05/06/2010   Seasonal and perennial allergic rhinitis 03/14/2010   ANEMIA-IRON DEFICIENCY 10/29/2009   Essential hypertension 10/29/2009   GERD 10/29/2009   History of colonic polyps 10/29/2009   UMBILICAL HERNIA, HX OF 10/29/2009   Home Medication(s) Prior to Admission medications   Medication Sig Start Date End Date Taking? Authorizing Provider  acetaminophen (TYLENOL) 650 MG CR tablet Take 1 tablet (650 mg total) by mouth every 8 (eight) hours as needed for pain (headaches). Patient taking differently: Take 1,300 mg by mouth every 8 (eight) hours  as needed for pain (or headaches). 07/05/22   Rodolph Bong, MD  albuterol (VENTOLIN HFA) 108 (90 Base) MCG/ACT inhaler TAKE 2 PUFFS BY MOUTH EVERY 6 HOURS AS NEEDED FOR WHEEZE OR SHORTNESS OF BREATH 09/10/23   Young, Rennis Chris, MD  apixaban (ELIQUIS) 5 MG TABS tablet Take 1 tablet (5 mg total) by mouth 2 (two) times daily. 08/13/22   Rhetta Mura, MD  benzonatate (TESSALON) 200 MG capsule TAKE 1 CAPSULE  (200 MG TOTAL) BY MOUTH 3 (THREE) TIMES DAILY AS NEEDED FOR COUGH. 06/15/23   Jetty Duhamel D, MD  Biotin 16109 MCG TABS Take 10,000 mcg by mouth daily.    [provider]  cetirizine (ZYRTEC) 10 MG tablet Take 10 mg by mouth at bedtime.    [provider]  dicyclomine (BENTYL) 20 MG tablet Take 20 mg by mouth every 6 (six) hours. 03/30/23   [provider]  donepezil (ARICEPT) 10 MG tablet Take 10 mg by mouth at bedtime. 12/27/21   [provider]  DULoxetine (CYMBALTA) 60 MG capsule Take 120 mg by mouth daily. 06/10/23   [provider]  furosemide (LASIX) 20 MG tablet TAKE 1 TABLET BY MOUTH EVERY DAY 06/10/23   Azalee Course, PA  hydrOXYzine (ATARAX) 25 MG tablet Take 1 tablet (25 mg total) by mouth every 6 (six) hours as needed for anxiety. 07/05/22   Rodolph Bong, MD  levothyroxine (SYNTHROID) 25 MCG tablet Take 12.5 mcg by mouth daily before breakfast.    [provider]  meclizine (ANTIVERT) 25 MG tablet Take 1 tablet (25 mg total) by mouth 3 (three) times daily as needed for dizziness. Patient taking differently: Take 25 mg by mouth 3 (three) times daily as needed for dizziness or nausea. 09/20/21   Micheline Maze, MD  memantine (NAMENDA) 10 MG tablet Take 10 mg by mouth 2 (two) times daily. 12/27/21   [provider]  metoprolol succinate (TOPROL XL) 50 MG 24 hr tablet Take 1 tablet (50 mg total) by mouth at bedtime. 02/17/23 02/17/24  Pokhrel, Rebekah Chesterfield, MD  nitroGLYCERIN (NITROSTAT) 0.4 MG SL tablet Place 0.4 mg under the tongue as needed.    [provider]  ondansetron (ZOFRAN-ODT) 4 MG disintegrating tablet Take 4 mg by mouth every 8 (eight) hours as needed for nausea or vomiting.    [provider]  oxybutynin (DITROPAN) 5 MG tablet Take 5 mg by mouth 2 (two) times daily.    [provider]  pantoprazole (PROTONIX) 40 MG tablet Take 1 tablet (40 mg total) by mouth daily. 04/27/23   Azalee Course, PA  Polyethyl  Glycol-Propyl Glycol (SYSTANE OP) Place 1 drop into both eyes 2 (two) times daily as needed (for dryness).    [provider]  potassium chloride SA (KLOR-CON M) 20 MEQ tablet Take 1 tablet (20 mEq total) by mouth daily. 04/27/23   Azalee Course, PA  QUEtiapine (SEROQUEL) 50 MG tablet Take 50 mg by mouth at bedtime. 04/22/22   [provider]  sennosides-docusate sodium (SENOKOT-S) 8.6-50 MG tablet Take 1 tablet by mouth daily as needed for constipation.    [provider]  tamsulosin (FLOMAX) 0.4 MG CAPS capsule Take 0.4 mg by mouth at bedtime.    [provider]  valsartan (DIOVAN) 80 MG tablet TAKE 1 TABLET BY MOUTH EVERY DAY 09/10/23   Rollene Rotunda, MD  venlafaxine XR (EFFEXOR-XR) 150 MG 24 hr capsule TAKE 1 CAPSULE (150 MG TOTAL) BY MOUTH DAILY. Patient taking differently: Take  150 mg by mouth daily. 12/14/12   Pecola Lawless, MD                                                                                                                                    Allergies Latex, Sulfa antibiotics, Aspirin, Nsaids, and Oxycodone-acetaminophen  Review of Systems Review of Systems As noted in HPI  Physical Exam Vital Signs  I have reviewed the triage vital signs BP (!) 110/56   Pulse 74   Temp (!) 96.8 F (36 C) (Tympanic)   Resp 14   Ht 5\' 3"  (1.6 m)   Wt 82.6 kg   SpO2 95%   BMI 32.24 kg/m   Physical Exam Constitutional:      General: She is not in acute distress.    Appearance: She is well-developed. She is not diaphoretic.  HENT:     Head: Normocephalic and atraumatic.     Right Ear: External ear normal.     Left Ear: External ear normal.     Nose: Nose normal.  Eyes:     General: No scleral icterus.       Right eye: No discharge.        Left eye: No discharge.     Conjunctiva/sclera: Conjunctivae normal.     Pupils: Pupils are equal, round, and reactive to light.  Cardiovascular:     Rate and Rhythm: Normal rate and regular rhythm.      Pulses:          Radial pulses are 2+ on the right side and 2+ on the left side.       Dorsalis pedis pulses are 2+ on the right side and 2+ on the left side.     Heart sounds: Normal heart sounds. No murmur heard.    No friction rub. No gallop.  Pulmonary:     Effort: Pulmonary effort is normal. No respiratory distress.     Breath sounds: Normal breath sounds. No stridor. No wheezing.  Abdominal:     General: There is no distension.     Palpations: Abdomen is soft.     Tenderness: There is abdominal tenderness in the suprapubic area.  Musculoskeletal:     Cervical back: Normal range of motion and neck supple. Tenderness present. No bony tenderness. Muscular tenderness present.     Thoracic back: No bony tenderness.     Lumbar back: No bony tenderness.       Back:     Comments: Clavicles stable. Chest stable to AP/Lat compression. Pelvis stable to Lat compression. No obvious extremity deformity. No chest or abdominal wall contusion.  Skin:    General: Skin is warm and dry.     Findings: No erythema or rash.  Neurological:     Mental Status: She is alert and oriented to person, place, and time.     Comments: Moving all extremities     ED Results  and Treatments Labs (all labs ordered are listed, but only abnormal results are displayed) Labs Reviewed  COMPREHENSIVE METABOLIC PANEL - Abnormal; Notable for the following components:      Result Value   Glucose, Bld 160 (*)    Creatinine, Ser 1.48 (*)    Albumin 3.4 (*)    GFR, Estimated 35 (*)    Anion gap 16 (*)    All other components within normal limits  CBC WITH DIFFERENTIAL/PLATELET - Abnormal; Notable for the following components:   Hemoglobin 10.1 (*)    HCT 32.6 (*)    All other components within normal limits  PROTIME-INR - Abnormal; Notable for the following components:   Prothrombin Time 18.3 (*)    INR 1.5 (*)    All other components within normal limits  I-STAT CHEM 8, ED - Abnormal; Notable for the  following components:   Creatinine, Ser 1.50 (*)    Glucose, Bld 148 (*)    Hemoglobin 11.2 (*)    HCT 33.0 (*)    All other components within normal limits  LIPASE, BLOOD  URINALYSIS, W/ REFLEX TO CULTURE (INFECTION SUSPECTED)                                                                                                                         EKG    Radiology CT CHEST ABDOMEN PELVIS W CONTRAST Result Date: 09/18/2023 CLINICAL DATA:  83 year old female status post fall on the way to bathroom. On Eliquis for atrial fibrillation. EXAM: CT CHEST, ABDOMEN, AND PELVIS WITH CONTRAST TECHNIQUE: Multidetector CT imaging of the chest, abdomen and pelvis was performed following the standard protocol during bolus administration of intravenous contrast. RADIATION DOSE REDUCTION: This exam was performed according to the departmental dose-optimization program which includes automated exposure control, adjustment of the mA and/or kV according to patient size and/or use of iterative reconstruction technique. CONTRAST:  75mL OMNIPAQUE IOHEXOL 350 MG/ML SOLN COMPARISON:  Chest CTA 06/30/2022.  Abdomen CTA 07/10/2023. FINDINGS: CT CHEST FINDINGS Cardiovascular: Calcified aortic atherosclerosis. Calcified coronary artery atherosclerosis. Cardiac size remains within normal limits. No pericardial effusion. No evidence of mediastinal vascular injury, and bilateral hilar pulmonary emboli seen last year appear regressed or resolved. Mediastinum/Nodes: Negative. No evidence of mediastinal hematoma, mass, lymphadenopathy. Lungs/Pleura: Major airways are patent. Mildly improved lung volumes compared to last year. Mild dependent atelectasis. Occasional subpleural lung scarring. No pleural effusion or evidence of pulmonary contusion. Small calcified right lower lobe granuloma. No convincing active lung inflammation. Musculoskeletal: Chronic bilateral shoulder arthroplasty with streak artifact. Chronic severe upper thoracic disc,  endplate, facet degeneration. Associated developing interbody thoracic ankylosis there, and widespread additional thoracic interbody ankylosis from flowing endplate osteophytes. No acute osseous abnormality identified in the thoracic spine. Chronic un-healed sternal fracture is stable from last year. No acute rib fracture identified. Right breast and chest wall surgical clips. No superficial soft tissue injury identified. CT ABDOMEN PELVIS FINDINGS Hepatobiliary: Gallbladder is diminutive or absent as before. Liver appears stable and  intact. Pancreas: Stable pancreatic atrophy. Spleen: Stable and intact. Adrenals/Urinary Tract: Stable and intact. Symmetric renal enhancement. No contrast excretion on the delayed images. But diminutive renal collecting systems and ureters. Diminutive bladder. Pelvic phleboliths. Stomach/Bowel: Redundant but decompressed large bowel in the pelvis. Redundant transverse colon. Cecum on a lax mesentery. No large bowel inflammation identified. Appendix diminutive or absent. Nondilated small bowel. Stomach is decompressed. There are duodenal bulb diverticula with no active inflammation (series 3, image 27). No free air or free fluid. Stable small fat containing umbilical hernia. Vascular/Lymphatic: Extensive Aortoiliac calcified atherosclerosis. Major arterial structures remain patent. Normal caliber abdominal aorta. No lymphadenopathy identified. Portal venous system appears patent on the delayed phase images. Reproductive: Surgically absent. Other: No pelvis free fluid. Musculoskeletal: L1 superior endplate compression fracture was present in January. Up to 40% loss of height now, versus 35% previously. No significant retropulsion or other complicating features. L2 through L5 appears stable and intact. Sacrum, SI joints, pelvis, and proximal femurs appear stable and intact. No superficial soft tissue injury. IMPRESSION: 1. No acute traumatic injury identified in the chest, abdomen, or  pelvis. 2. L1 superior endplate compression fracture was present in January. About 5% additional loss of height since that time but no retropulsion or other complicating features. 3. Aortic Atherosclerosis (ICD10-I70.0) and numerous chronic findings are detailed above. Electronically Signed   By: Odessa Fleming M.D.   On: 09/18/2023 06:54   CT Cervical Spine Wo Contrast Result Date: 09/18/2023 CLINICAL DATA:  83 year old female status post fall on the way to bathroom. On Eliquis for atrial fibrillation. EXAM: CT CERVICAL SPINE WITHOUT CONTRAST TECHNIQUE: Multidetector CT imaging of the cervical spine was performed without intravenous contrast. Multiplanar CT image reconstructions were also generated. RADIATION DOSE REDUCTION: This exam was performed according to the departmental dose-optimization program which includes automated exposure control, adjustment of the mA and/or kV according to patient size and/or use of iterative reconstruction technique. COMPARISON:  Head CT today. Cervical spine MRI [facility] 04/17/2021. FINDINGS: Alignment: Stable since 2022. Straightening of cervical lordosis with superimposed chronic degenerative anterolisthesis at C4-C5. Cervicothoracic junction alignment is within normal limits. Bilateral posterior element alignment is within normal limits. Skull base and vertebrae: Visualized skull base is intact. No atlanto-occipital dissociation. Congenital incomplete ossification of the posterior C1 ring. C1 and C2 appear intact and aligned. No acute osseous abnormality identified. Soft tissues and spinal canal: No prevertebral fluid or swelling. No visible canal hematoma. Retropharyngeal course of the carotids, normal variant. Carotid calcified atherosclerosis. Otherwise negative visible noncontrast neck soft tissues. Disc levels: Degenerative appearing cervical spine ankylosis at C2-C3, probably developing at other levels (C6-C7). Superimposed widespread chronic moderate to severe disc,  endplate, facet degeneration. Associated cervical spinal stenosis probably stable from the 2022 MRI. Upper chest: Chronic severe upper thoracic disc, endplate, and facet degeneration also with evidence of developing degenerative thoracic interbody ankylosis (T2-T3). Chest CT today is reported separately. IMPRESSION: 1. No acute traumatic injury identified in the cervical spine. 2. Chronic severe cervical and upper thoracic spine degeneration. Subsequent cervical spinal stenosis likely stable from a 2022 MRI. Electronically Signed   By: Odessa Fleming M.D.   On: 09/18/2023 06:44   CT Head Wo Contrast Result Date: 09/18/2023 CLINICAL DATA:  83 year old female status post fall on the way to bathroom. On Eliquis for atrial fibrillation. EXAM: CT HEAD WITHOUT CONTRAST TECHNIQUE: Contiguous axial images were obtained from the base of the skull through the vertex without intravenous contrast. RADIATION DOSE REDUCTION: This exam was performed according  to the departmental dose-optimization program which includes automated exposure control, adjustment of the mA and/or kV according to patient size and/or use of iterative reconstruction technique. COMPARISON:  Head CT 09/20/2021. FINDINGS: Brain: Stable cerebral volume. No midline shift, ventriculomegaly, mass effect, evidence of mass lesion, intracranial hemorrhage or evidence of cortically based acute infarction. Patchy mild to moderate for age cerebral white matter hypodensity appears stable. Vascular: Calcified atherosclerosis at the skull base. No suspicious intracranial vascular hyperdensity. Skull: Congenital incomplete ossification of the posterior C1 ring, normal variant. Calvarium appears stable and intact. No acute osseous abnormality identified. Sinuses/Orbits: Visualized paranasal sinuses and mastoids are stable and well aerated. Other: No orbit or scalp soft tissue injury identified. IMPRESSION: 1. No acute intracranial abnormality or acute traumatic injury  identified. 2. Stable mild to moderate for age cerebral white matter changes. Electronically Signed   By: Odessa Fleming M.D.   On: 09/18/2023 06:41    Medications Ordered in ED Medications  iohexol (OMNIPAQUE) 350 MG/ML injection 75 mL (75 mLs Intravenous Contrast Given 09/18/23 4098)   Procedures Procedures  (including critical care time) Medical Decision Making / ED Course   Medical Decision Making Amount and/or Complexity of Data Reviewed Labs: ordered. Decision-making details documented in ED Course. Radiology: ordered and independent interpretation performed. Decision-making details documented in ED Course. ECG/medicine tests: ordered and independent interpretation performed. Decision-making details documented in ED Course.  Risk Prescription drug management.    Patient presents after fall on blood thinners.  No obvious head trauma noted on exam.  No other significant injuries.  Patient does have upper back discomfort to palpation without midline tenderness.  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  Will obtain CT head and cervical spine.  These images were negative for any acute injuries  Given the report of abdominal discomfort with bloody bowel movements, and additional labs obtained. CBC without leukocytosis.  She has mild anemia with stable hemoglobin over the past several months. CMP without significant electrolyte derangements.  Mild AKI.  Currently awaiting UA to rule out an fraction and CT scan to rule out any intra-abdominal inflammatory/affect reticulitis  Patient care turned over to oncoming provider. Patient case and results discussed in detail; please see their note for further ED managment.        Final Clinical Impression(s) / ED Diagnoses Final diagnoses:  None    This chart was dictated using voice recognition software.  Despite best efforts to proofread,  errors can occur which  can change the documentation meaning.    Nira Conn, MD 09/25/23 [phone]

## 2023-09-18 NOTE — ED Notes (Signed)
 Trauma Response Nurse Documentation   Brenda Stephens is a 83 y.o. female arriving to [facility] ED via [facility]  On Eliquis (apixaban) daily. Trauma was activated as a Level 2 by Rande Brunt based on the following trauma criteria Elderly patients > 65 with head trauma on anti-coagulation (excluding ASA).  Patient cleared for CT by Dr. Eudelia Bunch. Pt transported to CT with trauma response nurse present to monitor. RN remained with the patient throughout their absence from the department for clinical observation.   GCS 15.  Trauma MD Arrival Time: N/A.  History   Past Medical History:  Diagnosis Date   Acute deep vein thrombosis (DVT) of proximal vein of right lower extremity (HCC) 07/02/2022   Acute pulmonary embolism (HCC) 07/15/2022   Allergy    latex   Anemia    Anxiety    Arthritis    Bilateral pulmonary embolism (HCC) 06/30/2022   Cancer (HCC) 01/2017   right breast cancer   Depression    Dyslipidemia    Eustachian tube dysfunction    GERD (gastroesophageal reflux disease)    Grade I diastolic dysfunction 07/15/2022   Hx of cardiovascular stress test    Myoview 10/13:  apical thinning, EF 72%, no ischemia   Hx of colonic polyps    Dr Ewing Schlein   Hypertension    Hypothyroidism    Other abnormal glucose    Pulmonary embolism (HCC) 06/30/2022     Past Surgical History:  Procedure Laterality Date   ABDOMINAL HYSTERECTOMY     BSO for uterine fibroids   ACHILLES TENDON SURGERY Right 10/07/2012   Procedure: RIGHT REPAIR RUPTURE TENDON PRIMARY OPEN/PERCUTANEOUS ;  Surgeon: Loreta Ave, MD;  Location: [facility];  Service: Orthopedics;  Laterality: Right;   APPENDECTOMY     BREAST LUMPECTOMY WITH RADIOACTIVE SEED AND SENTINEL LYMPH NODE BIOPSY Right 03/16/2017   Procedure: RIGHT BREAST LUMPECTOMY WITH RADIOACTIVE SEED AND RIGHT AXILLARY SENTINEL  NODE BIOPSY ERAS PATHWAY;  Surgeon: Emelia Loron, MD;  Location: [facility];   Service: General;  Laterality: Right;  PECTORAL BLOCK   CATARACT EXTRACTION, BILATERAL     CHOLECYSTECTOMY     COLONOSCOPY W/ POLYPECTOMY     Dr Ewing Schlein   EAR CYST EXCISION Right 10/07/2012   Procedure: EXCISION BONE CYST BENIGN TUMOR Manuela Neptune ;  Surgeon: Loreta Ave, MD;  Location:  SURGERY CENTER;  Service: Orthopedics;  Laterality: Right;   EXPLORATORY LAPAROTOMY     LEFT HEART CATH AND CORONARY ANGIOGRAPHY N/A 08/02/2019   Procedure: LEFT HEART CATH AND CORONARY ANGIOGRAPHY;  Surgeon: Swazil[place], Peter M, MD;  Location: [facility];  Service: Cardiovascular;  Laterality: N/A;   REVISION TOTAL SHOULDER TO REVERSE TOTAL SHOULDER Left 01/12/2019   Procedure: REVISION TOTAL SHOULDER TO REVERSE TOTAL SHOULDER;  Surgeon: Bjorn Pippin, MD;  Location: [facility];  Service: Orthopedics;  Laterality: Left;   SINUS SURGERY WITH INSTATRAK     TOTAL SHOULDER REPLACEMENT     Right shoulder   TOTAL SHOULDER REPLACEMENT  05/2010   Left   TUBAL LIGATION         Initial Focused Assessment (If applicable, or please see trauma documentation): Airway-- intact, no visible obstruction Breathing-- spontaneous, unlabored Circulation-- no obvious bleeding noted on exam  CT's Completed:   CT Head, CT C-Spine, CT Chest w/ contrast, and CT abdomen/pelvis w/ contrast   Interventions:  See event summary  Plan for disposition:  Unknown at current  time.  Consults completed:  None at this time.  Event Summary: Patient brought in by [facility] from home following a mechanical fall. Patient unsure if she hit her head, denies +LOC. On exam patient complaining of neck and upper back pain. Manual BP obtained. Lab work obtained. Patient to CT with TRN. CT head, c-spine, chest/abdomen/pelvis completed.   MTP Summary (If applicable):  N/A  Bedside handoff with ED RN Lelon Mast.    Leota Sauers  Trauma Response RN  Please call TRN at [phone] for further assistance.

## 2023-09-21 ENCOUNTER — Other Ambulatory Visit: Payer: MEDICARE

## 2023-09-27 DIAGNOSIS — R7989 Other specified abnormal findings of blood chemistry: Secondary | ICD-10-CM | POA: Insufficient documentation

## 2023-09-27 DIAGNOSIS — I480 Paroxysmal atrial fibrillation: Secondary | ICD-10-CM | POA: Insufficient documentation

## 2023-09-27 NOTE — Progress Notes (Deleted)
  Cardiology Office Note:   Date:  09/27/2023  ID:  Brenda Stephens, Brenda Stephens 09/06/1940, MRN [id number] PCP: Genevie Ann  [facility] Providers Cardiologist:  Rollene Rotunda, MD {  History of Present Illness:   Brenda Stephens is a 83 y.o. female who I saw in September 2023 for evaluation of chest pain.  I saw her for palpitations in 2017 and leg swelling in 2019.  She had chest pain and had normal coronaries in 2021.  She was in the hospital early this year and found to have submassive bilateral pulmonary emboli with right lower extremity DVT.  She was placed on heparin and then eventually Eliquis.  She was thought to have breast cancer and sedentary lifestyle as an etiology.  Echocardiogram demonstrated well-preserved ejection fraction.  Her daughter called recently says she was having increasing shortness of breath.    ***   ***  She actually looks like she is doing relatively well.  She does complain of some pressure-like discomfort over her left chest above her breast.  This seems to be there intermittently.  She had a chest x-ray which showed a little bit of cardiomegaly and an aortic calcium.  She had a little bit of lower extremity swelling and a little bit of abdominal distention.  She is not having any new shortness of breath but she is very sedentary.  Her daughter says she just gets around a little bit in the house and stays in her chair or in her room in bed a lot.  She is not having any new PND or orthopnea.  Has not noticed any new palpitations, presyncope or syncope.    ROS: ***  Studies Reviewed:    EKG:       ***  Risk Assessment/Calculations:   {Does this patient have ATRIAL FIBRILLATION?:[phone]} No BP recorded.  {Refresh Note OR Click here to enter BP  :1}***        Physical Exam:   VS:  There were no vitals taken for this visit.   Wt Readings from Last 3 Encounters:  09/18/23 182 lb (82.6 kg)  07/09/23 190 lb 14.7 oz (86.6 kg)  04/27/23 191 lb  (86.6 kg)     GEN: Well nourished, well developed in no acute distress NECK: No JVD; No carotid bruits CARDIAC: ***RR, *** murmurs, rubs, gallops RESPIRATORY:  Clear to auscultation without rales, wheezing or rhonchi  ABDOMEN: Soft, non-tender, non-distended EXTREMITIES:  No edema; No deformity   ASSESSMENT AND PLAN:   Atrial Fibrillation:   ***  Recently diagnosed with AFib, currently in normal sinus rhythm. Discussed the risk of stroke and the importance of consistent Eliquis use. -Continue Eliquis twice daily. -Continue Metoprolol for rate control, with additional half tablet as needed for breakthrough AFib. -Check blood pressure before administering additional Metoprolol. -Follow-up in 3-4 months.   Chronic Chest Pain:  ***  Likely non-cardiac chest pain, worsened by food, not associated with physical exertion.  Clean coronary artery on previous carotid catheterization in 2021. -Continue Protonix 40mg  daily.   History of PE:  ***Occurred in January 2024, completed at least 6 months of Eliquis.  Currently on Eliquis due to atrial fibrillation.   Elevated TSH:  ***  Elevated TSH noted during recent hospitalization. -Primary care doctor to monitor thyroid function.     Follow up ***  Signed, Rollene Rotunda, MD

## 2023-09-29 ENCOUNTER — Ambulatory Visit: Payer: MEDICARE | Admitting: Cardiology

## 2023-09-29 DIAGNOSIS — I2699 Other pulmonary embolism without acute cor pulmonale: Secondary | ICD-10-CM

## 2023-09-29 DIAGNOSIS — I480 Paroxysmal atrial fibrillation: Secondary | ICD-10-CM

## 2023-09-29 DIAGNOSIS — R7989 Other specified abnormal findings of blood chemistry: Secondary | ICD-10-CM

## 2023-09-29 DIAGNOSIS — R072 Precordial pain: Secondary | ICD-10-CM

## 2023-10-13 ENCOUNTER — Inpatient Hospital Stay: Admission: RE | Admit: 2023-10-13 | Payer: MEDICARE | Source: Ambulatory Visit

## 2023-10-13 ENCOUNTER — Other Ambulatory Visit: Payer: MEDICARE

## 2023-10-25 ENCOUNTER — Ambulatory Visit
Admission: RE | Admit: 2023-10-25 | Discharge: 2023-10-25 | Disposition: A | Discharge status: Home / Self Care | Payer: MEDICARE | Source: Ambulatory Visit | Attending: Physician Assistant | Admitting: Physician Assistant

## 2023-10-25 DIAGNOSIS — M4312 Spondylolisthesis, cervical region: Secondary | ICD-10-CM

## 2023-10-25 DIAGNOSIS — M4316 Spondylolisthesis, lumbar region: Secondary | ICD-10-CM

## 2023-10-25 DIAGNOSIS — S32010A Wedge compression fracture of first lumbar vertebra, initial encounter for closed fracture: Secondary | ICD-10-CM

## 2023-10-25 DIAGNOSIS — M5186 Other intervertebral disc disorders, lumbar region: Secondary | ICD-10-CM

## 2023-10-29 IMAGING — CT CT HEAD W/O CM
4 series · 15 of 47 positions shown, 17 images · non-contrast
Comparison: Head CT dated 11/22/2011.

CLINICAL DATA: Dizziness.



[Series 3: head without · axial · non-contrast · 0.41mm/px · z∈[-143,-23]mm · 7 of 34 slices shown, 9 images]
[im 5/34  brain]
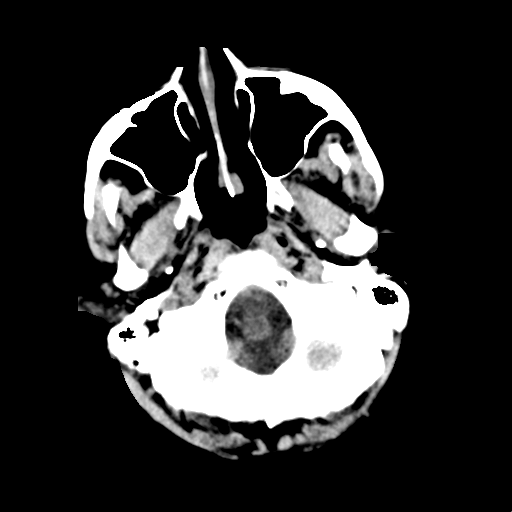
[im 5/34  bone]
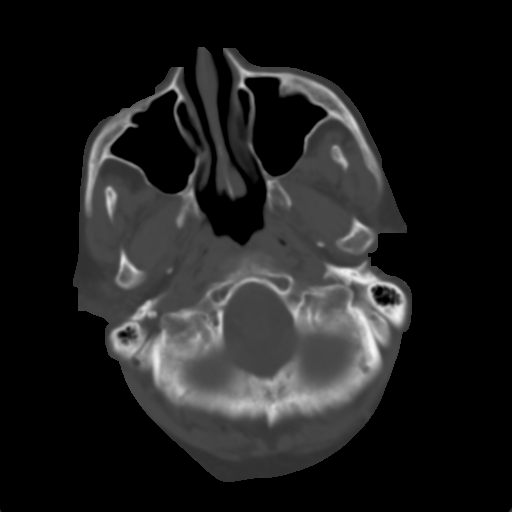
[im 9/34  brain]
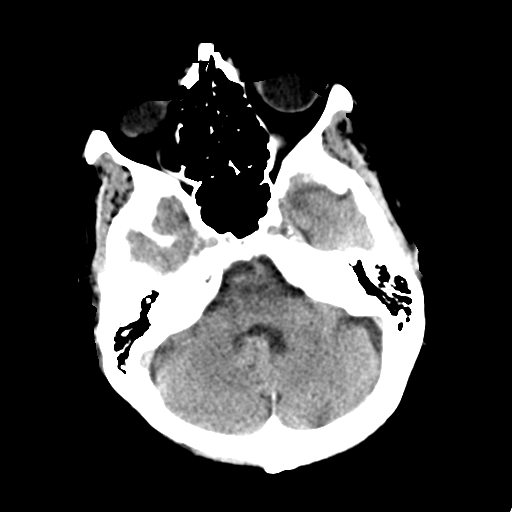
[im 13/34  brain]
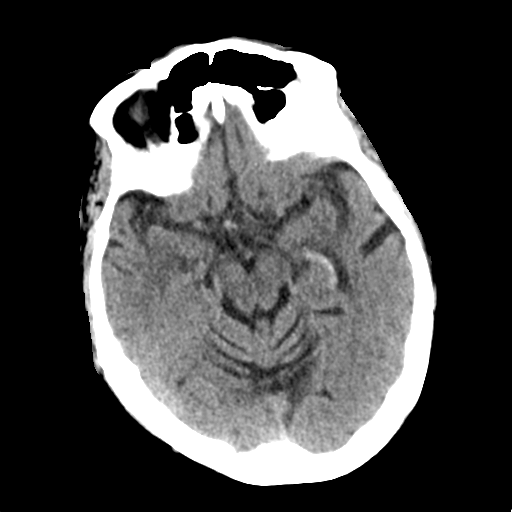
[im 17/34  brain]
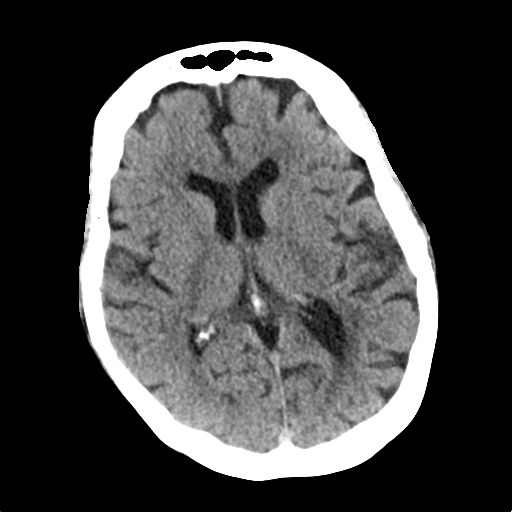
[im 21/34  brain]
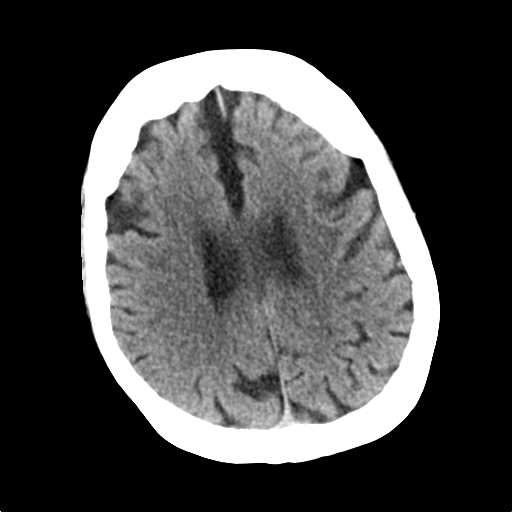
[im 21/34  bone]
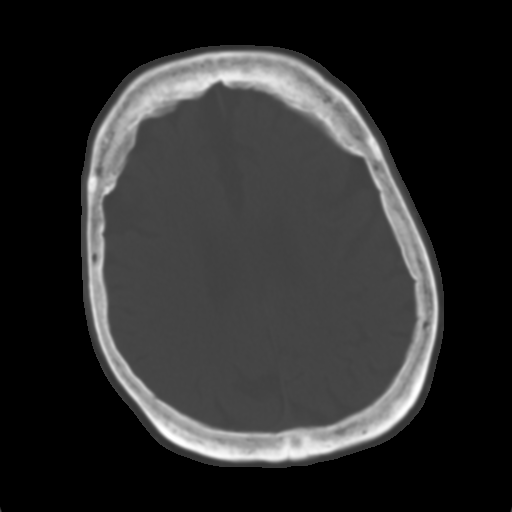
[im 25/34  brain]
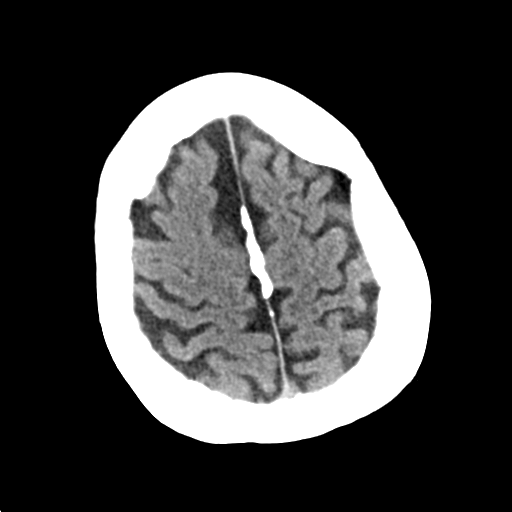
[im 29/34  brain]
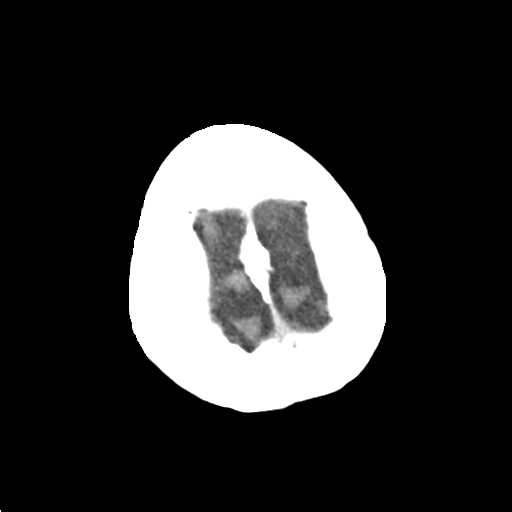

[Series 4: head bone · axial · 0.41mm/px · z∈[-147,-131]mm · 2 of 83 slices shown]
[im 9/83  bone]
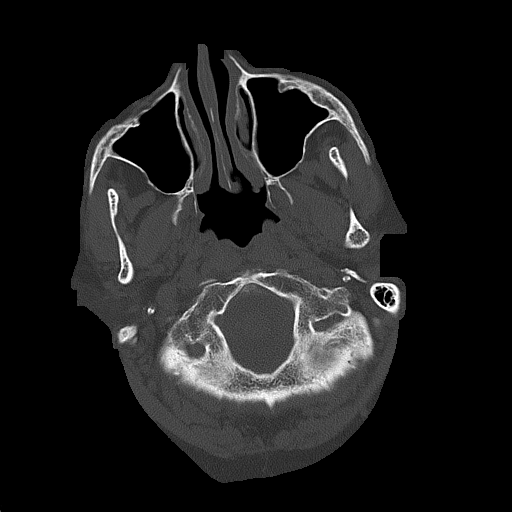
[im 17/83  bone]
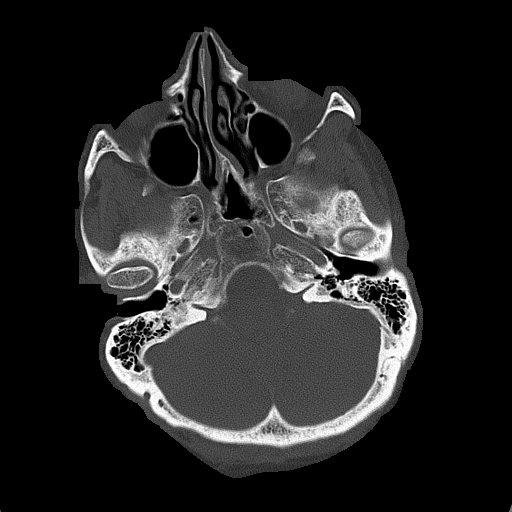

[Series 5: head without cor · coronal · non-contrast · 0.32mm/px · 3 of 68 slices shown]
[im 27/68  brain]
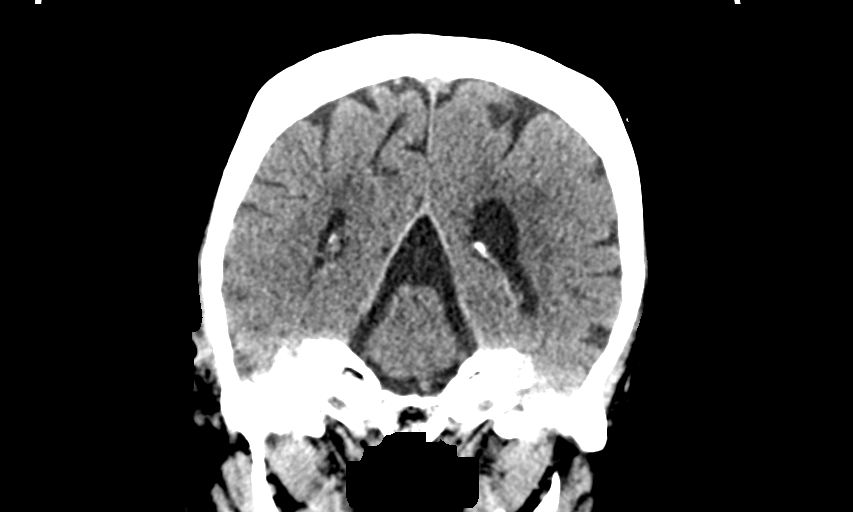
[im 32/68  brain]
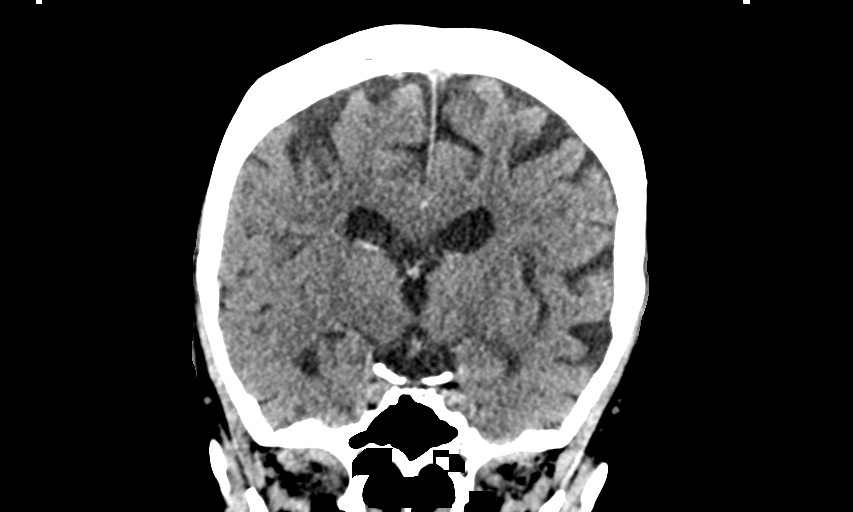
[im 37/68  brain]
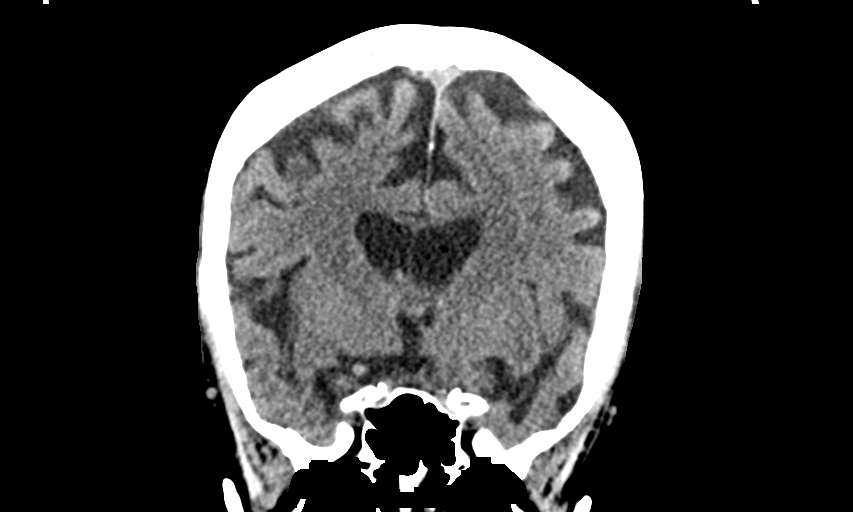

[Series 6: head without sag · sagittal · non-contrast · 0.35mm/px · 3 of 66 slices shown]
[im 22/66  brain]
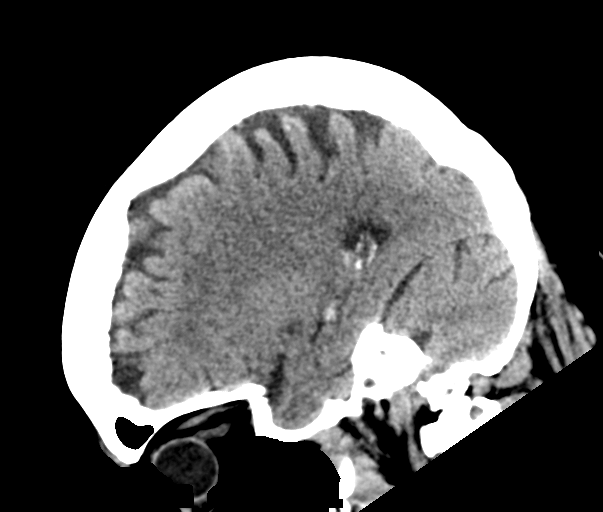
[im 33/66  brain]
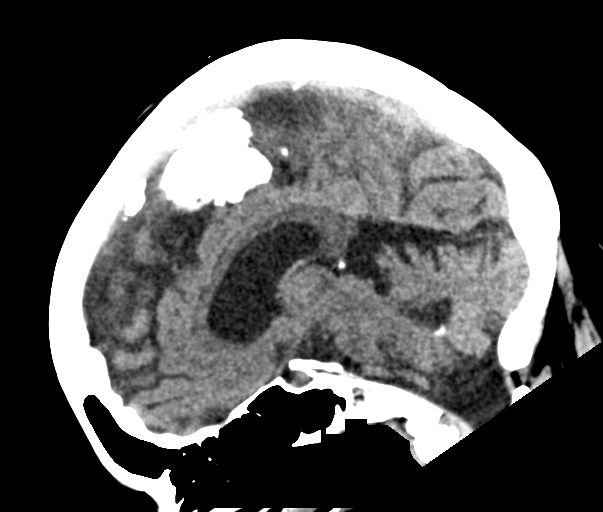
[im 44/66  brain]
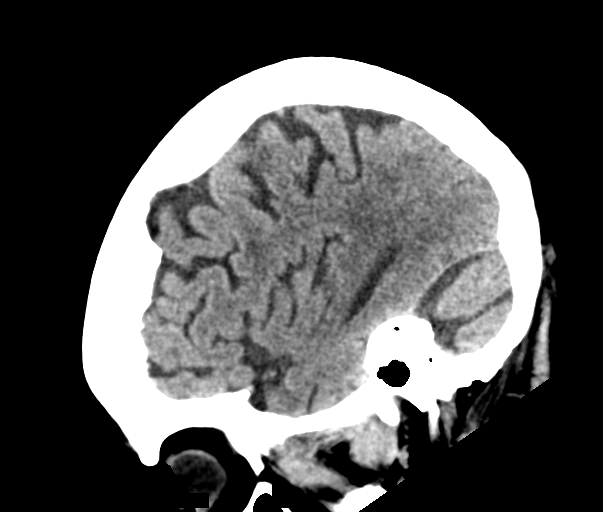

[15 of 47 positions shown; findings below may reference images not displayed]

FINDINGS: Brain: Mild age-related atrophy and chronic microvascular ischemic
changes. There is no acute intracranial hemorrhage. No mass effect
or midline shift. No extra-axial fluid collection.

Vascular: No hyperdense vessel or unexpected calcification.

Skull: Normal. Negative for fracture or focal lesion.

Sinuses/Orbits: No acute finding.

Other: None
IMPRESSION: 1. No acute intracranial pathology.
2. Mild age-related atrophy and chronic microvascular ischemic
changes.

## 2023-11-12 ENCOUNTER — Telehealth: Payer: Self-pay | Admitting: Cardiology

## 2023-11-12 NOTE — Telephone Encounter (Signed)
 Pt called to report that she has been having pressure on the left side of her chest for a few days and she says it has been on and off but today has been worsening and more constant.... she says it started at rest but bothering her more with exertion.   She has been having some SOB associated with it... I was unable to find her an PV tomorrow and with the weekend coming I have urged her to have her daughter take her to the ED and she agreed.

## 2023-11-12 NOTE — Telephone Encounter (Signed)
 Pt c/o of Chest Pain: STAT if active (IN THIS MOMENT) CP, including tightness, pressure, jaw pain, shoulder/upper arm/back pain, SOB, nausea, and vomiting.  1. Are you having CP right now (tightness, pressure, or discomfort)? No but its been pressure   2. Are you experiencing any other symptoms (ex. SOB, nausea, vomiting, sweating)? sob  3. How long have you been experiencing CP? weekend  4. Is your CP continuous or coming and going? Coming and going  5. Have you taken Nitroglycerin ? no  6. If CP returns before callback, please consider calling 911. ?

## 2023-11-21 ENCOUNTER — Other Ambulatory Visit: Payer: Self-pay

## 2023-11-21 ENCOUNTER — Observation Stay (HOSPITAL_COMMUNITY)
Admission: EM | Admit: 2023-11-21 | Discharge: 2023-11-23 | Disposition: A | Discharge status: Home / Self Care | Payer: MEDICARE | Attending: Internal Medicine | Admitting: Internal Medicine

## 2023-11-21 ENCOUNTER — Emergency Department (HOSPITAL_COMMUNITY): Payer: MEDICARE

## 2023-11-21 ENCOUNTER — Encounter (HOSPITAL_COMMUNITY): Payer: Self-pay

## 2023-11-21 DIAGNOSIS — I5032 Chronic diastolic (congestive) heart failure: Secondary | ICD-10-CM | POA: Insufficient documentation

## 2023-11-21 DIAGNOSIS — F419 Anxiety disorder, unspecified: Secondary | ICD-10-CM | POA: Diagnosis present

## 2023-11-21 DIAGNOSIS — K219 Gastro-esophageal reflux disease without esophagitis: Secondary | ICD-10-CM | POA: Diagnosis not present

## 2023-11-21 DIAGNOSIS — I10 Essential (primary) hypertension: Secondary | ICD-10-CM | POA: Diagnosis present

## 2023-11-21 DIAGNOSIS — D509 Iron deficiency anemia, unspecified: Secondary | ICD-10-CM | POA: Diagnosis not present

## 2023-11-21 DIAGNOSIS — F32A Depression, unspecified: Secondary | ICD-10-CM | POA: Diagnosis present

## 2023-11-21 DIAGNOSIS — I480 Paroxysmal atrial fibrillation: Secondary | ICD-10-CM | POA: Diagnosis present

## 2023-11-21 DIAGNOSIS — N1831 Chronic kidney disease, stage 3a: Secondary | ICD-10-CM | POA: Insufficient documentation

## 2023-11-21 DIAGNOSIS — F0390 Unspecified dementia without behavioral disturbance: Secondary | ICD-10-CM | POA: Diagnosis present

## 2023-11-21 DIAGNOSIS — F418 Other specified anxiety disorders: Secondary | ICD-10-CM | POA: Insufficient documentation

## 2023-11-21 DIAGNOSIS — Z853 Personal history of malignant neoplasm of breast: Secondary | ICD-10-CM | POA: Diagnosis not present

## 2023-11-21 DIAGNOSIS — Z79899 Other long term (current) drug therapy: Secondary | ICD-10-CM | POA: Insufficient documentation

## 2023-11-21 DIAGNOSIS — E039 Hypothyroidism, unspecified: Secondary | ICD-10-CM | POA: Diagnosis present

## 2023-11-21 DIAGNOSIS — R0602 Shortness of breath: Secondary | ICD-10-CM

## 2023-11-21 DIAGNOSIS — I482 Chronic atrial fibrillation, unspecified: Secondary | ICD-10-CM | POA: Insufficient documentation

## 2023-11-21 DIAGNOSIS — I130 Hypertensive heart and chronic kidney disease with heart failure and stage 1 through stage 4 chronic kidney disease, or unspecified chronic kidney disease: Secondary | ICD-10-CM | POA: Insufficient documentation

## 2023-11-21 DIAGNOSIS — J45998 Other asthma: Secondary | ICD-10-CM | POA: Diagnosis not present

## 2023-11-21 DIAGNOSIS — J4520 Mild intermittent asthma, uncomplicated: Secondary | ICD-10-CM | POA: Diagnosis present

## 2023-11-21 DIAGNOSIS — Z91040 Latex allergy status: Secondary | ICD-10-CM | POA: Diagnosis not present

## 2023-11-21 DIAGNOSIS — Z7901 Long term (current) use of anticoagulants: Secondary | ICD-10-CM | POA: Diagnosis not present

## 2023-11-21 DIAGNOSIS — Z86711 Personal history of pulmonary embolism: Secondary | ICD-10-CM | POA: Diagnosis present

## 2023-11-21 DIAGNOSIS — Z96611 Presence of right artificial shoulder joint: Secondary | ICD-10-CM | POA: Insufficient documentation

## 2023-11-21 DIAGNOSIS — R55 Syncope and collapse: Secondary | ICD-10-CM | POA: Diagnosis not present

## 2023-11-21 DIAGNOSIS — E785 Hyperlipidemia, unspecified: Secondary | ICD-10-CM | POA: Diagnosis present

## 2023-11-21 DIAGNOSIS — D508 Other iron deficiency anemias: Secondary | ICD-10-CM

## 2023-11-21 DIAGNOSIS — R109 Unspecified abdominal pain: Secondary | ICD-10-CM | POA: Diagnosis not present

## 2023-11-21 DIAGNOSIS — Z96612 Presence of left artificial shoulder joint: Secondary | ICD-10-CM | POA: Diagnosis not present

## 2023-11-21 DIAGNOSIS — Z87891 Personal history of nicotine dependence: Secondary | ICD-10-CM | POA: Insufficient documentation

## 2023-11-21 DIAGNOSIS — R002 Palpitations: Secondary | ICD-10-CM | POA: Diagnosis present

## 2023-11-21 DIAGNOSIS — K649 Unspecified hemorrhoids: Secondary | ICD-10-CM | POA: Diagnosis not present

## 2023-11-21 DIAGNOSIS — Z86718 Personal history of other venous thrombosis and embolism: Secondary | ICD-10-CM

## 2023-11-21 LAB — COMPREHENSIVE METABOLIC PANEL WITH GFR
ALT: 16 U/L (ref 0–44)
AST: 26 U/L (ref 15–41)
Albumin: 3.4 g/dL — ABNORMAL LOW (ref 3.5–5.0)
Alkaline Phosphatase: 53 U/L (ref 38–126)
Anion gap: 13 (ref 5–15)
BUN: 11 mg/dL (ref 8–23)
CO2: 22 mmol/L (ref 22–32)
Calcium: 9.7 mg/dL (ref 8.9–10.3)
Chloride: 105 mmol/L (ref 98–111)
Creatinine, Ser: 1.07 mg/dL — ABNORMAL HIGH (ref 0.44–1.00)
GFR, Estimated: 52 mL/min — ABNORMAL LOW (ref >60)
Glucose, Bld: 186 mg/dL — ABNORMAL HIGH (ref 70–99)
Potassium: 3.8 mmol/L (ref 3.5–5.1)
Sodium: 140 mmol/L (ref 135–145)
Total Bilirubin: 0.9 mg/dL (ref 0.0–1.2)
Total Protein: 6.1 g/dL — ABNORMAL LOW (ref 6.5–8.1)

## 2023-11-21 LAB — CBC
HCT: 30.9 % — ABNORMAL LOW (ref 36.0–46.0)
Hemoglobin: 9.7 g/dL — ABNORMAL LOW (ref 12.0–15.0)
MCH: 28.2 pg (ref 26.0–34.0)
MCHC: 31.4 g/dL (ref 30.0–36.0)
MCV: 89.8 fL (ref 80.0–100.0)
Platelets: 292 10*3/uL (ref 150–400)
RBC: 3.44 MIL/uL — ABNORMAL LOW (ref 3.87–5.11)
RDW: 14.8 % (ref 11.5–15.5)
WBC: 8.5 10*3/uL (ref 4.0–10.5)
nRBC: 0 % (ref 0.0–0.2)

## 2023-11-21 LAB — TROPONIN I (HIGH SENSITIVITY)
Troponin I (High Sensitivity): 10 ng/L (ref <18)
Troponin I (High Sensitivity): 9 ng/L (ref <18)

## 2023-11-21 LAB — MAGNESIUM: Magnesium: 2 mg/dL (ref 1.7–2.4)

## 2023-11-21 LAB — TSH: TSH: 5.77 u[IU]/mL — ABNORMAL HIGH (ref 0.350–4.500)

## 2023-11-21 LAB — T4, FREE: Free T4: 0.98 ng/dL (ref 0.61–1.12)

## 2023-11-21 LAB — TYPE AND SCREEN
ABO/RH(D): A POS
Antibody Screen: NEGATIVE

## 2023-11-21 LAB — POC OCCULT BLOOD, ED: Fecal Occult Bld: NEGATIVE

## 2023-11-21 LAB — LIPASE, BLOOD: Lipase: 25 U/L (ref 11–51)

## 2023-11-21 LAB — CBG MONITORING, ED: Glucose-Capillary: 172 mg/dL — ABNORMAL HIGH (ref 70–99)

## 2023-11-21 MED ORDER — METOPROLOL SUCCINATE ER 50 MG PO TB24
50.0000 mg | ORAL_TABLET | Freq: Every day | ORAL | Status: DC
  Administered 2023-11-21 – 2023-11-22 (×2): 50 mg via ORAL
  Filled 2023-11-21 (×2): qty 1

## 2023-11-21 MED ORDER — DULOXETINE HCL 60 MG PO CPEP
120.0000 mg | ORAL_CAPSULE | Freq: Every day | ORAL | Status: DC
  Administered 2023-11-22 – 2023-11-23 (×2): 120 mg via ORAL
  Filled 2023-11-21 (×2): qty 2

## 2023-11-21 MED ORDER — MEMANTINE HCL 10 MG PO TABS
10.0000 mg | ORAL_TABLET | Freq: Two times a day (BID) | ORAL | Status: DC
  Administered 2023-11-21 – 2023-11-23 (×4): 10 mg via ORAL
  Filled 2023-11-21 (×4): qty 1

## 2023-11-21 MED ORDER — HYDROXYZINE HCL 25 MG PO TABS: 25.0000 mg | ORAL_TABLET | Freq: Three times a day (TID) | ORAL | Status: DC | PRN

## 2023-11-21 MED ORDER — IRBESARTAN 150 MG PO TABS
75.0000 mg | ORAL_TABLET | Freq: Every day | ORAL | Status: DC
  Administered 2023-11-22 – 2023-11-23 (×2): 75 mg via ORAL
  Filled 2023-11-21 (×2): qty 1

## 2023-11-21 MED ORDER — TAMSULOSIN HCL 0.4 MG PO CAPS
0.4000 mg | ORAL_CAPSULE | Freq: Every day | ORAL | Status: DC
  Administered 2023-11-21 – 2023-11-22 (×2): 0.4 mg via ORAL
  Filled 2023-11-21 (×2): qty 1

## 2023-11-21 MED ORDER — ACETAMINOPHEN 325 MG PO TABS
650.0000 mg | ORAL_TABLET | Freq: Four times a day (QID) | ORAL | Status: DC | PRN
  Administered 2023-11-21 – 2023-11-22 (×3): 650 mg via ORAL
  Filled 2023-11-21 (×3): qty 2

## 2023-11-21 MED ORDER — DICYCLOMINE HCL 20 MG PO TABS: 20.0000 mg | ORAL_TABLET | Freq: Four times a day (QID) | ORAL | Status: DC | PRN

## 2023-11-21 MED ORDER — ONDANSETRON HCL 4 MG/2ML IJ SOLN
4.0000 mg | Freq: Once | INTRAMUSCULAR | Status: AC
  Administered 2023-11-21: 4 mg via INTRAVENOUS
  Filled 2023-11-21: qty 2

## 2023-11-21 MED ORDER — QUETIAPINE FUMARATE 25 MG PO TABS
50.0000 mg | ORAL_TABLET | Freq: Every day | ORAL | Status: DC
  Administered 2023-11-21 – 2023-11-22 (×2): 50 mg via ORAL
  Filled 2023-11-21 (×2): qty 2

## 2023-11-21 MED ORDER — APIXABAN 5 MG PO TABS
5.0000 mg | ORAL_TABLET | Freq: Two times a day (BID) | ORAL | Status: DC
  Administered 2023-11-21 – 2023-11-23 (×4): 5 mg via ORAL
  Filled 2023-11-21 (×4): qty 1

## 2023-11-21 MED ORDER — ALBUTEROL SULFATE (2.5 MG/3ML) 0.083% IN NEBU: 3.0000 mL | INHALATION_SOLUTION | RESPIRATORY_TRACT | Status: DC | PRN

## 2023-11-21 MED ORDER — VENLAFAXINE HCL ER 75 MG PO CP24
150.0000 mg | ORAL_CAPSULE | Freq: Every day | ORAL | Status: DC
  Administered 2023-11-22 – 2023-11-23 (×2): 150 mg via ORAL
  Filled 2023-11-21 (×2): qty 2

## 2023-11-21 MED ORDER — LEVOTHYROXINE SODIUM 25 MCG PO TABS
12.5000 ug | ORAL_TABLET | Freq: Every day | ORAL | Status: DC
  Administered 2023-11-22 – 2023-11-23 (×2): 12.5 ug via ORAL
  Filled 2023-11-21 (×2): qty 1

## 2023-11-21 MED ORDER — PANTOPRAZOLE SODIUM 40 MG PO TBEC
40.0000 mg | DELAYED_RELEASE_TABLET | Freq: Every day | ORAL | Status: DC
  Administered 2023-11-22 – 2023-11-23 (×2): 40 mg via ORAL
  Filled 2023-11-21 (×2): qty 1

## 2023-11-21 MED ORDER — DONEPEZIL HCL 5 MG PO TABS
10.0000 mg | ORAL_TABLET | Freq: Every day | ORAL | Status: DC
  Administered 2023-11-21 – 2023-11-22 (×2): 10 mg via ORAL
  Filled 2023-11-21 (×2): qty 2

## 2023-11-21 MED ORDER — IOHEXOL 350 MG/ML SOLN
75.0000 mL | Freq: Once | INTRAVENOUS | Status: AC | PRN
  Administered 2023-11-21: 75 mL via INTRAVENOUS

## 2023-11-21 MED ORDER — ACETAMINOPHEN 650 MG RE SUPP: 650.0000 mg | Freq: Four times a day (QID) | RECTAL | Status: DC | PRN

## 2023-11-21 MED ORDER — OXYBUTYNIN CHLORIDE 5 MG PO TABS
5.0000 mg | ORAL_TABLET | Freq: Two times a day (BID) | ORAL | Status: DC
Start: 2023-11-21 — End: 2023-11-23
  Administered 2023-11-21 – 2023-11-23 (×4): 5 mg via ORAL
  Filled 2023-11-21 (×4): qty 1

## 2023-11-21 MED ORDER — SODIUM CHLORIDE 0.9% FLUSH
3.0000 mL | Freq: Two times a day (BID) | INTRAVENOUS | Status: DC
  Administered 2023-11-21 – 2023-11-23 (×5): 3 mL via INTRAVENOUS

## 2023-11-21 MED ORDER — POLYVINYL ALCOHOL 1.4 % OP SOLN
1.0000 [drp] | Freq: Two times a day (BID) | OPHTHALMIC | Status: DC | PRN
  Filled 2023-11-21: qty 15

## 2023-11-21 MED ORDER — SODIUM CHLORIDE 0.9 % IV BOLUS
1000.0000 mL | Freq: Once | INTRAVENOUS | Status: AC
  Administered 2023-11-21: 1000 mL via INTRAVENOUS

## 2023-11-21 MED ORDER — PANTOPRAZOLE SODIUM 40 MG IV SOLR
40.0000 mg | Freq: Once | INTRAVENOUS | Status: AC
  Administered 2023-11-21: 40 mg via INTRAVENOUS
  Filled 2023-11-21: qty 10

## 2023-11-21 MED ORDER — MECLIZINE HCL 25 MG PO TABS: 25.0000 mg | ORAL_TABLET | Freq: Three times a day (TID) | ORAL | Status: DC | PRN

## 2023-11-21 MED ORDER — FUROSEMIDE 20 MG PO TABS: 20.0000 mg | ORAL_TABLET | Freq: Every day | ORAL | Status: DC

## 2023-11-21 MED ORDER — POLYETHYLENE GLYCOL 3350 17 G PO PACK: 17.0000 g | PACK | Freq: Every day | ORAL | Status: DC | PRN

## 2023-11-21 NOTE — ED Triage Notes (Signed)
 Pt to ED via [facility] from home. Pt has had intermittent palpitations and left sided chest heaviness for past week. Pt recently diagnosed with atrial fibrillation. Pt has had nausea and constipation. Pt received zofran  4mg  via 22g LAC piv. Pt also received glucose 15mg  for cbg =35 on EMS arrival. Repeat cbg  = 167.   Pt A&Ox4, c/o left sided chest pressure and abdominal pain. Pt describes abdominal pain as an achiness. Last BM this am. Pt states she always has blood in her stool.   HR 78 136/92 98% RA RR 20

## 2023-11-21 NOTE — ED Provider Notes (Signed)
 I provided a substantive portion of the care of this patient.  I personally made/approved the management plan for this patient and take responsibility for the patient management.  EKG Interpretation Date/Time:  Saturday Nov 21 2023 08:52:03 EDT Ventricular Rate:  85 PR Interval:  168 QRS Duration:  78 QT Interval:  401 QTC Calculation: 477 R Axis:   4  Text Interpretation: Sinus rhythm Abnormal R-wave progression, early transition Borderline T abnormalities, anterior leads No significant change since last tracing Confirmed by Lind Repine ([id number]) on 11/21/2023 9:55:42 AM   83 year old female presents from home with palpitations and chest heaviness.  States this was exertional.  History of A-fib and is on Eliquis  and states that she has been compliant.  EKG prior to potation shows sinus rhythm at this time.  Workup is pending   Lind Repine, MD 11/21/23 [phone]

## 2023-11-21 NOTE — ED Notes (Signed)
 No complaints of dizziness or light headedness reported.

## 2023-11-21 NOTE — ED Notes (Signed)
 Pt mentions L foot "going numb". Further describes as "pins and needles, like going to sleep". CMS, ROM, skin, strength and sensation intact. Pedal pulses palpable. Skin temp, strength and sensation equal bilaterally. No drift. "Not painful".

## 2023-11-21 NOTE — ED Notes (Signed)
 Back from CT

## 2023-11-21 NOTE — Progress Notes (Signed)
 Patient brought to 4E from ED. VSS. Telemetry box applied, CCMD notified. Patient oriented to room and staff. Call bell in reach.  Kenard Gower, RN

## 2023-11-21 NOTE — ED Notes (Signed)
 Tolerated rectal exam well. Chaperone present, no obvious blood.

## 2023-11-21 NOTE — ED Notes (Signed)
 To CT via stretcher, alert, NAD, calm, interactive, no changes.

## 2023-11-21 NOTE — ED Provider Notes (Signed)
 [place] EMERGENCY DEPARTMENT AT [facility] Provider Note   CSN: [id number] Arrival date & time: 11/21/23  4098     History  Chief Complaint  Patient presents with   Palpitations    Brenda Stephens is a 83 y.o. female who presents to the ED with a cc of palpitations. She has a  history of chronic intermittent chest pain with normal coronary arteries on cath in 2021, palpitations, lower extremity edema, history of bilateral PE with right lower extremity DVT on Eliquis  and Afib. She reports several days of epigastric abdominal pain and bright red stools and bleeding from her bottom.  She reports compliance with Eliquis .  She reports she has had poor oral intake due to nausea and pain in her stomach.  She reports that this morning she woke up to go to the bathroom and had severe chest pain palpitations and felt as if she was going to pass out.  She states "I could barely make it."  According to nursing triage note the patient also notably had a CBG of 35 with EMS and was given "15 mg of glucose" prior to arrival.  Repeat CBG here 167.  Patient denies vomiting fevers.  She has significant exertional dyspnea associated with her palpitations.   Palpitations      Home Medications Prior to Admission medications   Medication Sig Start Date End Date Taking? Authorizing Provider  acetaminophen  (TYLENOL ) 650 MG CR tablet Take 1 tablet (650 mg total) by mouth every 8 (eight) hours as needed for pain (headaches). Patient taking differently: Take 1,300 mg by mouth every 8 (eight) hours as needed for pain (or headaches). 07/05/22   Armenta Landau, MD  albuterol  (VENTOLIN  HFA) 108 [phone] Base) MCG/ACT inhaler TAKE 2 PUFFS BY MOUTH EVERY 6 HOURS AS NEEDED FOR WHEEZE OR SHORTNESS OF BREATH 09/10/23   Young, Rupert Counts D, MD  apixaban  (ELIQUIS ) 5 MG TABS tablet Take 1 tablet (5 mg total) by mouth 2 (two) times daily. 08/13/22   Samtani, Jai-Gurmukh, MD  benzonatate  (TESSALON ) 200 MG capsule TAKE 1  CAPSULE (200 MG TOTAL) BY MOUTH 3 (THREE) TIMES DAILY AS NEEDED FOR COUGH. 06/15/23   Young, Rupert Counts D, MD  Biotin  10000 MCG TABS Take 10,000 mcg by mouth daily.    [provider]  cetirizine (ZYRTEC) 10 MG tablet Take 10 mg by mouth at bedtime.    [provider]  dicyclomine (BENTYL) 20 MG tablet Take 20 mg by mouth every 6 (six) hours. 03/30/23   [provider]  donepezil  (ARICEPT ) 10 MG tablet Take 10 mg by mouth at bedtime. 12/27/21   [provider]  DULoxetine  (CYMBALTA ) 60 MG capsule Take 120 mg by mouth daily. 06/10/23   [provider]  furosemide  (LASIX ) 20 MG tablet TAKE 1 TABLET BY MOUTH EVERY DAY 06/10/23   Meng, Hao, PA  hydrOXYzine  (ATARAX ) 25 MG tablet Take 1 tablet (25 mg total) by mouth every 6 (six) hours as needed for anxiety. 07/05/22   Armenta Landau, MD  levothyroxine  (SYNTHROID ) 25 MCG tablet Take 12.5 mcg by mouth daily before breakfast.    [provider]  meclizine  (ANTIVERT ) 25 MG tablet Take 1 tablet (25 mg total) by mouth 3 (three) times daily as needed for dizziness. Patient taking differently: Take 25 mg by mouth 3 (three) times daily as needed for dizziness or nausea. 09/20/21   Lavon Pound, MD  memantine  (NAMENDA ) 10 MG tablet Take 10 mg by mouth 2 (two) times  daily. 12/27/21   [provider]  metoprolol  succinate (TOPROL  XL) 50 MG 24 hr tablet Take 1 tablet (50 mg total) by mouth at bedtime. 02/17/23 02/17/24  Pokhrel, Amador Bad, MD  nitroGLYCERIN  (NITROSTAT ) 0.4 MG SL tablet Place 0.4 mg under the tongue as needed.    [provider]  ondansetron  (ZOFRAN -ODT) 4 MG disintegrating tablet Take 4 mg by mouth every 8 (eight) hours as needed for nausea or vomiting.    [provider]  oxybutynin  (DITROPAN ) 5 MG tablet Take 5 mg by mouth 2 (two) times daily.    [provider]  pantoprazole  (PROTONIX ) 40 MG tablet Take 1 tablet (40 mg total) by mouth daily. 04/27/23   Meng, Hao, PA   Polyethyl Glycol-Propyl Glycol (SYSTANE OP) Place 1 drop into both eyes 2 (two) times daily as needed (for dryness).    [provider]  potassium chloride  SA (KLOR-CON  M) 20 MEQ tablet Take 1 tablet (20 mEq total) by mouth daily. 04/27/23   Meng, Hao, PA  QUEtiapine  (SEROQUEL ) 50 MG tablet Take 50 mg by mouth at bedtime. 04/22/22   [provider]  sennosides-docusate sodium  (SENOKOT-S) 8.6-50 MG tablet Take 1 tablet by mouth daily as needed for constipation.    [provider]  tamsulosin  (FLOMAX ) 0.4 MG CAPS capsule Take 0.4 mg by mouth at bedtime.    [provider]  valsartan  (DIOVAN ) 80 MG tablet TAKE 1 TABLET BY MOUTH EVERY DAY 09/10/23   Eilleen Grates, MD  venlafaxine  XR (EFFEXOR -XR) 150 MG 24 hr capsule TAKE 1 CAPSULE (150 MG TOTAL) BY MOUTH DAILY. Patient taking differently: Take 150 mg by mouth daily. 12/14/12   Alinda Apley, MD      Allergies    Latex, Sulfa  antibiotics, Aspirin , Nsaids, and Oxycodone -acetaminophen     Review of Systems   Review of Systems  Cardiovascular:  Positive for palpitations.    Physical Exam Updated Vital Signs BP (!) 162/64 (BP Location: Right Arm)   Pulse 88   Temp 97.7 F (36.5 C) (Oral)   Resp 18   Ht 5\' 3"  (1.6 m)   Wt 81.6 kg   SpO2 100%   BMI 31.89 kg/m  Physical Exam Vitals and nursing note reviewed. Exam conducted with a chaperone present ([employer]).  Constitutional:      General: She is not in acute distress.    Appearance: She is well-developed. She is not diaphoretic.  HENT:     Head: Normocephalic and atraumatic.     Right Ear: External ear normal.     Left Ear: External ear normal.     Nose: Nose normal.     Mouth/Throat:     Mouth: Mucous membranes are moist.  Eyes:     General: No scleral icterus.    Conjunctiva/sclera: Conjunctivae normal.  Cardiovascular:     Rate and Rhythm: Normal rate and regular rhythm.     Heart sounds: Normal heart sounds. No murmur  heard.    No friction rub. No gallop.  Pulmonary:     Effort: Pulmonary effort is normal. No respiratory distress.     Breath sounds: Normal breath sounds.  Abdominal:     General: Bowel sounds are normal. There is no distension.     Palpations: Abdomen is soft. There is no mass.     Tenderness: There is abdominal tenderness. There is no guarding.  Genitourinary:    Comments: Digital rectal exam reveals sphincter with normal tone, multiple nonthrombosed external hemorrhoids, pale/orange appearing  stool.  Negative occult stool test Musculoskeletal:     Cervical back: Normal range of motion.  Skin:    General: Skin is warm and dry.  Neurological:     Mental Status: She is alert and oriented to person, place, and time.  Psychiatric:        Behavior: Behavior normal.     ED Results / Procedures / Treatments   Labs (all labs ordered are listed, but only abnormal results are displayed) Labs Reviewed  CBC - Abnormal; Notable for the following components:      Result Value   RBC 3.44 (*)    Hemoglobin 9.7 (*)    HCT 30.9 (*)    All other components within normal limits  CBG MONITORING, ED - Abnormal; Notable for the following components:   Glucose-Capillary 172 (*)    All other components within normal limits  COMPREHENSIVE METABOLIC PANEL WITH GFR  TSH  MAGNESIUM   LIPASE, BLOOD  POC OCCULT BLOOD, ED  TYPE AND SCREEN  TROPONIN I (HIGH SENSITIVITY)    EKG None  Radiology No results found.  Procedures Procedures    Medications Ordered in ED Medications - No data to display  ED Course/ Medical Decision Making/ A&P Clinical Course as of 11/21/23 1549  Sat Nov 21, 2023  0954 Glucose(!): 186 [AH]  0955 Hemoglobin(!): 9.7 [AH]  1141 TSH(!): 5.770 [AH]    Clinical Course User Index [AH] Tama Fails, PA-C                                 Medical Decision Making Amount and/or Complexity of Data Reviewed Labs: ordered. Decision-making details documented in ED  Course. Radiology: ordered.  Risk Prescription drug management. Decision regarding hospitalization.   This patient presents to the ED for concern of palpitations, chest pain, shortness of breath and near syncope, this involves an extensive number of treatment options, and is a complaint that carries with it a high risk of complications and morbidity.  The differential for syncope is extensive and includes, but is not limited to: arrythmia (Vtach, SVT, SSS, sinus arrest, AV block, bradycardia) aortic stenosis, AMI, HOCM, PE, atrial myxoma, pulmonary hypertension, orthostatic hypotension, (hypovolemia, drug effect, GB syndrome, micturition, cough, swall) carotid sinus sensitivity, Seizure, TIA/CVA, hypoglycemia,  Vertigo.   Co morbidities:   has a past medical history of Acute deep vein thrombosis (DVT) of proximal vein of right lower extremity (HCC) (07/02/2022), Acute pulmonary embolism (HCC) (07/15/2022), AKI (acute kidney injury) (HCC) (02/16/2023), Allergy, Anemia, Anxiety, Arthritis, Atrial fibrillation with RVR (HCC) (02/12/2023), Bilateral pulmonary embolism (HCC) (06/30/2022), Cancer (HCC) (01/2017), Depression, Dyslipidemia, Eustachian tube dysfunction, GERD (gastroesophageal reflux disease), Grade I diastolic dysfunction (07/15/2022), cardiovascular stress test, colonic polyps, Hypertension, Hypothyroidism, Other abnormal glucose, and Pulmonary embolism (HCC) (06/30/2022).   Social Determinants of Health:  . SDOH Screenings   Food Insecurity: No Food Insecurity (07/09/2023)  Housing: Low Risk  (07/09/2023)  Transportation Needs: No Transportation Needs (07/09/2023)  Utilities: Not At Risk (07/09/2023)  Financial Resource Strain: Low Risk  (03/12/2021)   [facility] prior to 08/30/2022., [facility] visits prior to 08/30/2022.  Physical Activity: Inactive (03/12/2021)   [facility] from [facility] visits prior  to 08/30/2022., [facility] visits prior to 08/30/2022.  Social Connections: Unknown (07/09/2023)  Stress: Stress Concern Present (03/12/2021)   [facility] from [facility]  visits prior to 08/30/2022., [facility] visits prior to 08/30/2022.  Tobacco Use: Medium Risk (11/21/2023)     Additional history:  {Additional history obtained from nursing triage and previous notes   Lab Tests:  I Ordered, and personally interpreted labs.  The pertinent results include:   Labs reviewed.  She has mildly elevated TSH suggestive of mild hypothyroidism.  Capillary glucose repeat at 172.  Negative fecal occult, 2 negative troponins, mag within normal limits.  CMP shows elevated glucose at 186.  CBC with hemoglobin at 9.7 appears to be baseline for the patient. Imaging Studies:  I ordered imaging studies including CT angio of the chest PE study and CT abdomen pelvis Wells chest x-ray I independently visualized and interpreted imaging which showed no acute findings I agree with the radiologist interpretation  Cardiac Monitoring/ECG:  The patient was maintained on a cardiac monitor.  I personally viewed and interpreted the cardiac monitored which showed an underlying rhythm of: Sinus rhythm  Medicines ordered and prescription drug management:  I ordered medication including  Medications  venlafaxine  XR (EFFEXOR -XR) 24 hr capsule 150 mg (has no administration in time range)  irbesartan  (AVAPRO ) tablet 75 mg (has no administration in time range)  metoprolol  succinate (TOPROL -XL) 24 hr tablet 50 mg (has no administration in time range)  donepezil  (ARICEPT ) tablet 10 mg (has no administration in time range)  DULoxetine  (CYMBALTA ) DR capsule 120 mg (has no administration in time range)  hydrOXYzine  (ATARAX ) tablet 25 mg (has no administration in time range)  memantine  (NAMENDA ) tablet 10 mg (has no administration in time range)  QUEtiapine  (SEROQUEL )  tablet 50 mg (has no administration in time range)  levothyroxine  (SYNTHROID ) tablet 12.5 mcg (has no administration in time range)  dicyclomine (BENTYL) tablet 20 mg (has no administration in time range)  meclizine  (ANTIVERT ) tablet 25 mg (has no administration in time range)  pantoprazole  (PROTONIX ) EC tablet 40 mg (has no administration in time range)  oxybutynin  (DITROPAN ) tablet 5 mg (has no administration in time range)  tamsulosin  (FLOMAX ) capsule 0.4 mg (has no administration in time range)  apixaban  (ELIQUIS ) tablet 5 mg (has no administration in time range)  albuterol  (PROVENTIL ) (2.5 MG/3ML) 0.083% nebulizer solution 3 mL (has no administration in time range)  polyethylene glycol 0.4% and propylene glycol 0.3% (SYSTANE) ophthalmic gel (has no administration in time range)  sodium chloride  flush (NS) 0.9 % injection 3 mL (3 mLs Intravenous Given 11/21/23 1452)  acetaminophen  (TYLENOL ) tablet 650 mg (has no administration in time range)    Or  acetaminophen  (TYLENOL ) suppository 650 mg (has no administration in time range)  polyethylene glycol (MIRALAX / GLYCOLAX) packet 17 g (has no administration in time range)  ondansetron  (ZOFRAN ) injection 4 mg (4 mg Intravenous Given 11/21/23 0916)  pantoprazole  (PROTONIX ) injection 40 mg (40 mg Intravenous Given 11/21/23 0916)  iohexol  (OMNIPAQUE ) 350 MG/ML injection 75 mL (75 mLs Intravenous Contrast Given 11/21/23 1107)  sodium chloride  0.9 % bolus 1,000 mL (0 mLs Intravenous Stopped 11/21/23 1452)   for near syncope, nausea, sob Reevaluation of the patient after these medicines showed that the patient improved I have reviewed the patients home medicines and have made adjustments as needed  Test Considered:    Critical Interventions:    Consultations Obtained: Case is Dr. Lyndee Saner with Dr. Rufina Cough.  Feel that patient would be appropriate for inpatient monitoring given orthostatic hypotension and near syncope.  Problem List / ED  Course:     ICD-10-CM  1. Near syncope  R55       MDM: Patient here with palpitations chest pressure near syncope and nausea. Patient will need admission for further monitoring, poor po status   Dispostion:  After consideration of the diagnostic results and the patients response to treatment, I feel that the patent would benefit from .         Final Clinical Impression(s) / ED Diagnoses Final diagnoses:  Near syncope    Rx / DC Orders ED Discharge Orders     None         Tama Fails, PA-C 11/21/23 1556    Lind Repine, MD 11/22/23 [phone]

## 2023-11-21 NOTE — H&P (Signed)
 History and Physical   Brenda Stephens NWG:[id number] DOB: [birth year] DOA: 11/21/2023  PCP: Mitchell Ana, PA-C   Patient coming from: Home  Chief Complaint: Near syncope, chest pain, palpitations, hypoglycemia  HPI: Brenda Stephens is a 83 y.o. female with medical history significant of hypertension, hyperlipidemia, hypothyroidism, GERD, A-fib, history of diastolic dysfunction, anxiety, depression, dementia, breast cancer, PE, DVT, asthmatic bronchitis, anemia presenting with chest pain, palpitations, near syncope, hypoglycemia.  Patient reports an episode this morning when she got up out of bed to go to the bathroom where she became lightheaded, felt like she might pass out, develop chest pain and palpitations.  Reports some dyspnea on exertion as well.  Has had history of chronic intermittent chest pain with prior workup including clean coronaries on cath in 2021.  Does have history of DVT and PE on Eliquis  currently.  Patient called EMS and per report on initial arrival CBG was 35 and glucose was administered with improvement.  Denies fevers, chills.  ED Course: Vital signs in the ED notable for blood pressure in the 120s-170s systolic, heart rate in the 70s-100s.  Orthostatic vital signs obtained and diastolic blood pressure positive from sitting to standing with a drop of 20 from 96-76.  Lab workup included CMP with creatinine 1.07, glucose 186, protein 6.1, albumin 3.4.  CBC with hemoglobin 9.7 which is below baseline of 11 with normal MCV.  TSH remains mildly elevated at 5.7, FOBT negative.  Lipase normal.  Troponin negative x 2.  Type and screen pending.    Imaging studies included chest x-ray which showed no acute abnormality.  PE x-ray which shows no large PE but could not exclude a small distal PE but.  This could be secondary to other changes/artifact, coronary plaque also noted.  CT abdomen pelvis showed no acute normality.  Demonstrated stable changes status post  cholecystectomy, stable diverticulosis, stable L1 compression.  Patient received Zofran , IV PPI, 1 L IV fluids in the ED.  Case was discussed with cardiology who advised her symptoms do not appear to be cardiac in nature and recommend reconsult as needed.  Review of Systems: As per HPI otherwise all other systems reviewed and are negative.  Past Medical History:  Diagnosis Date   Acute deep vein thrombosis (DVT) of proximal vein of right lower extremity (HCC) 07/02/2022   Acute pulmonary embolism (HCC) 07/15/2022   AKI (acute kidney injury) (HCC) 02/16/2023   Allergy    latex   Anemia    Anxiety    Arthritis    Atrial fibrillation with RVR (HCC) 02/12/2023   Bilateral pulmonary embolism (HCC) 06/30/2022   Cancer (HCC) 01/2017   right breast cancer   Depression    Dyslipidemia    Eustachian tube dysfunction    GERD (gastroesophageal reflux disease)    Grade I diastolic dysfunction 07/15/2022   Hx of cardiovascular stress test    Myoview  10/13:  apical thinning, EF 72%, no ischemia   Hx of colonic polyps    Dr Lavaughn [place]   Hypertension    Hypothyroidism    Other abnormal glucose    Pulmonary embolism (HCC) 06/30/2022    Past Surgical History:  Procedure Laterality Date   ABDOMINAL HYSTERECTOMY     BSO for uterine fibroids   ACHILLES TENDON SURGERY Right 10/07/2012   Procedure: RIGHT REPAIR RUPTURE TENDON PRIMARY OPEN/PERCUTANEOUS ;  Surgeon: Ferd Householder, MD;  Location: [facility];  Service: Orthopedics;  Laterality: Right;   APPENDECTOMY  BREAST LUMPECTOMY WITH RADIOACTIVE SEED AND SENTINEL LYMPH NODE BIOPSY Right 03/16/2017   Procedure: RIGHT BREAST LUMPECTOMY WITH RADIOACTIVE SEED AND RIGHT AXILLARY SENTINEL  NODE BIOPSY ERAS PATHWAY;  Surgeon: Enid Harry, MD;  Location: [facility];  Service: General;  Laterality: Right;  PECTORAL BLOCK   CATARACT EXTRACTION, BILATERAL     CHOLECYSTECTOMY     COLONOSCOPY W/ POLYPECTOMY     Dr Lavaughn [place]    EAR CYST EXCISION Right 10/07/2012   Procedure: EXCISION BONE CYST BENIGN TUMOR Amado Juneau ;  Surgeon: Ferd Householder, MD;  Location: [facility];  Service: Orthopedics;  Laterality: Right;   EXPLORATORY LAPAROTOMY     LEFT HEART CATH AND CORONARY ANGIOGRAPHY N/A 08/02/2019   Procedure: LEFT HEART CATH AND CORONARY ANGIOGRAPHY;  Surgeon: Swaziland, Peter M, MD;  Location: [facility];  Service: Cardiovascular;  Laterality: N/A;   REVISION TOTAL SHOULDER TO REVERSE TOTAL SHOULDER Left 01/12/2019   Procedure: REVISION TOTAL SHOULDER TO REVERSE TOTAL SHOULDER;  Surgeon: Micheline Ahr, MD;  Location: [facility];  Service: Orthopedics;  Laterality: Left;   SINUS SURGERY WITH INSTATRAK     TOTAL SHOULDER REPLACEMENT     Right shoulder   TOTAL SHOULDER REPLACEMENT  05/2010   Left   TUBAL LIGATION      Social History  reports that she quit smoking about 61 years ago. Her smoking use included cigarettes. She started smoking about 67 years ago. She has a 3 pack-year smoking history. She has never been exposed to tobacco smoke. She has never used smokeless tobacco. She reports current alcohol  use of about 3.0 standard drinks of alcohol  per week. She reports that she does not use drugs.  Allergies  Allergen Reactions   Latex Rash   Sulfa  Antibiotics Anaphylaxis and Nausea Only   Aspirin  Other (See Comments)    GI bleeding   Nsaids Other (See Comments)    GI upset    Oxycodone -Acetaminophen  Nausea Only    Family History  Problem Relation Age of Onset   Hiatal hernia Mother    Heart attack Mother 25   Heart disease Father 62       CAD @ autopsy   Cancer Father        renal cancer   Heart attack Brother 62       fatal   Diabetes Neg Hx    Stroke Neg Hx   Reviewed on admission  Prior to Admission medications   Medication Sig Start Date End Date Taking? Authorizing Provider  acetaminophen  (TYLENOL ) 650 MG CR tablet Take 1 tablet (650 mg total) by mouth every 8 (eight)  hours as needed for pain (headaches). Patient taking differently: Take 1,300 mg by mouth every 8 (eight) hours as needed for pain (or headaches). 07/05/22   Armenta Landau, MD  albuterol  (VENTOLIN  HFA) 108 (90 Base) MCG/ACT inhaler TAKE 2 PUFFS BY MOUTH EVERY 6 HOURS AS NEEDED FOR WHEEZE OR SHORTNESS OF BREATH 09/10/23   Rosa College D, MD  apixaban  (ELIQUIS ) 5 MG TABS tablet Take 1 tablet (5 mg total) by mouth 2 (two) times daily. 08/13/22   Samtani, Jai-Gurmukh, MD  benzonatate  (TESSALON ) 200 MG capsule TAKE 1 CAPSULE (200 MG TOTAL) BY MOUTH 3 (THREE) TIMES DAILY AS NEEDED FOR COUGH. 06/15/23   Rosa [employer] D, MD  Biotin  10000 MCG TABS Take 10,000 mcg by mouth daily.    [provider]  cetirizine (ZYRTEC) 10 MG tablet Take 10 mg by mouth  at bedtime.    [provider]  dicyclomine (BENTYL) 20 MG tablet Take 20 mg by mouth every 6 (six) hours. 03/30/23   [provider]  donepezil  (ARICEPT ) 10 MG tablet Take 10 mg by mouth at bedtime. 12/27/21   [provider]  DULoxetine  (CYMBALTA ) 60 MG capsule Take 120 mg by mouth daily. 06/10/23   [provider]  furosemide  (LASIX ) 20 MG tablet TAKE 1 TABLET BY MOUTH EVERY DAY 06/10/23   Meng, Hao, PA  hydrOXYzine  (ATARAX ) 25 MG tablet Take 1 tablet (25 mg total) by mouth every 6 (six) hours as needed for anxiety. 07/05/22   Armenta Landau, MD  levothyroxine  (SYNTHROID ) 25 MCG tablet Take 12.5 mcg by mouth daily before breakfast.    [provider]  meclizine  (ANTIVERT ) 25 MG tablet Take 1 tablet (25 mg total) by mouth 3 (three) times daily as needed for dizziness. Patient taking differently: Take 25 mg by mouth 3 (three) times daily as needed for dizziness or nausea. 09/20/21   Lavon Pound, MD  memantine  (NAMENDA ) 10 MG tablet Take 10 mg by mouth 2 (two) times daily. 12/27/21   [provider]  metoprolol  succinate (TOPROL  XL) 50 MG 24 hr tablet Take 1 tablet (50 mg total) by mouth at bedtime.  02/17/23 02/17/24  Pokhrel, Amador Bad, MD  nitroGLYCERIN  (NITROSTAT ) 0.4 MG SL tablet Place 0.4 mg under the tongue as needed.    [provider]  ondansetron  (ZOFRAN -ODT) 4 MG disintegrating tablet Take 4 mg by mouth every 8 (eight) hours as needed for nausea or vomiting.    [provider]  oxybutynin  (DITROPAN ) 5 MG tablet Take 5 mg by mouth 2 (two) times daily.    [provider]  pantoprazole  (PROTONIX ) 40 MG tablet Take 1 tablet (40 mg total) by mouth daily. 04/27/23   Meng, Hao, PA  Polyethyl Glycol-Propyl Glycol (SYSTANE OP) Place 1 drop into both eyes 2 (two) times daily as needed (for dryness).    [provider]  potassium chloride  SA (KLOR-CON  M) 20 MEQ tablet Take 1 tablet (20 mEq total) by mouth daily. 04/27/23   Meng, Hao, PA  QUEtiapine  (SEROQUEL ) 50 MG tablet Take 50 mg by mouth at bedtime. 04/22/22   [provider]  sennosides-docusate sodium  (SENOKOT-S) 8.6-50 MG tablet Take 1 tablet by mouth daily as needed for constipation.    [provider]  tamsulosin  (FLOMAX ) 0.4 MG CAPS capsule Take 0.4 mg by mouth at bedtime.    [provider]  valsartan  (DIOVAN ) 80 MG tablet TAKE 1 TABLET BY MOUTH EVERY DAY 09/10/23   Eilleen Grates, MD  venlafaxine  XR (EFFEXOR -XR) 150 MG 24 hr capsule TAKE 1 CAPSULE (150 MG TOTAL) BY MOUTH DAILY. Patient taking differently: Take 150 mg by mouth daily. 12/14/12   Alinda Apley, MD    Physical Exam: Vitals:   11/21/23 1200 11/21/23 1215 11/21/23 1230 11/21/23 1300  BP: (!) 157/84  (!) 170/101 (!) 146/85  Pulse: 77 80 94 83  Resp: 16 14 (!) 21 15  Temp:      TempSrc:      SpO2: 100% 100% 98% 100%  Weight:      Height:        Physical Exam Constitutional:      General: She is not in acute distress.    Appearance: Normal appearance. She is obese.  HENT:     Head: Normocephalic and atraumatic.     Mouth/Throat:     Mouth: Mucous  membranes are moist.     Pharynx: Oropharynx is  clear.  Eyes:     Extraocular Movements: Extraocular movements intact.     Pupils: Pupils are equal, round, and reactive to light.  Cardiovascular:     Rate and Rhythm: Normal rate and regular rhythm.     Pulses: Normal pulses.     Heart sounds: Normal heart sounds.  Pulmonary:     Effort: Pulmonary effort is normal. No respiratory distress.     Breath sounds: Normal breath sounds.  Abdominal:     General: Bowel sounds are normal. There is no distension.     Palpations: Abdomen is soft.     Tenderness: There is no abdominal tenderness.  Musculoskeletal:        General: No swelling or deformity.  Skin:    General: Skin is warm and dry.  Neurological:     General: No focal deficit present.     Mental Status: Mental status is at baseline.    Labs on Admission: I have personally reviewed following labs and imaging studies  CBC: Recent Labs  Lab 11/21/23 0855  WBC 8.5  HGB 9.7*  HCT 30.9*  MCV 89.8  PLT 292    Basic Metabolic Panel: Recent Labs  Lab 11/21/23 0855  NA 140  K 3.8  CL 105  CO2 22  GLUCOSE 186*  BUN 11  CREATININE 1.07*  CALCIUM  9.7  MG 2.0    GFR: Estimated Creatinine Clearance: 41 mL/min (A) (by C-G formula based on SCr of 1.07 mg/dL (H)).  Liver Function Tests: Recent Labs  Lab 11/21/23 0855  AST 26  ALT 16  ALKPHOS 53  BILITOT 0.9  PROT 6.1*  ALBUMIN 3.4*    Urine analysis:    Component Value Date/Time   COLORURINE YELLOW 09/18/2023 0828   APPEARANCEUR CLEAR 09/18/2023 0828   LABSPEC >1.046 (H) 09/18/2023 0828   PHURINE 6.0 09/18/2023 0828   GLUCOSEU NEGATIVE 09/18/2023 0828   HGBUR NEGATIVE 09/18/2023 0828   BILIRUBINUR NEGATIVE 09/18/2023 0828   KETONESUR NEGATIVE 09/18/2023 0828   PROTEINUR NEGATIVE 09/18/2023 0828   UROBILINOGEN 0.2 06/04/2010 0845   NITRITE NEGATIVE 09/18/2023 0828   LEUKOCYTESUR NEGATIVE 09/18/2023 0828    Radiological Exams on Admission: CT ABDOMEN PELVIS W CONTRAST Result Date:  11/21/2023 CLINICAL DATA:  Abdominal pain. EXAM: CT ABDOMEN AND PELVIS WITH CONTRAST TECHNIQUE: Multidetector CT imaging of the abdomen and pelvis was performed using the standard protocol following bolus administration of intravenous contrast. RADIATION DOSE REDUCTION: This exam was performed according to the departmental dose-optimization program which includes automated exposure control, adjustment of the mA and/or kV according to patient size and/or use of iterative reconstruction technique. CONTRAST:  75mL OMNIPAQUE  IOHEXOL  350 MG/ML SOLN COMPARISON:  CT a of the chest performed concurrently. See separate report. CTA of the abdomen and pelvis on 07/25/2023 and CT of the abdomen and pelvis on 09/18/2023. FINDINGS: Lower chest: Nonocclusive subsegmental pulmonary embolism suggested by [employer] software. Pulmonary arteries are not well opacified on the abdominal CT. See chest CTA report for further description of findings. Hepatobiliary: No focal liver abnormality is seen. Status post cholecystectomy. Stable mild dilatation of the common bile duct status post cholecystectomy without visualized choledocholithiasis. The common bile duct measures up to approximately 9 mm. Pancreas: Unremarkable. No pancreatic ductal dilatation or surrounding inflammatory changes. Spleen: Normal in size without focal abnormality. Adrenals/Urinary Tract: Adrenal glands are unremarkable. Kidneys are normal, without renal calculi, focal lesion, or hydronephrosis. Bladder is unremarkable. Stomach/Bowel: Bowel  shows no evidence of obstruction, ileus, inflammation or lesion. The appendix is surgically absent. No free intraperitoneal air. Stable mild diverticulosis of the sigmoid colon without evidence acute diverticulitis. Vascular/Lymphatic: Stable atherosclerosis of the abdominal aorta without aneurysm. No lymphadenopathy identified. Reproductive: Status post hysterectomy. No adnexal masses. Other: No abdominal wall hernia or abnormality. No  abdominopelvic ascites. Musculoskeletal: Stable mild superior endplate compression of the L1 vertebral body. IMPRESSION: 1. No acute findings in the abdomen or pelvis. 2. Stable mild dilatation of the common bile duct status post cholecystectomy without visualized choledocholithiasis. 3. Stable mild diverticulosis of the sigmoid colon without evidence of acute diverticulitis. 4. Stable mild superior endplate compression of the L1 vertebral body. 5. Aortic atherosclerosis. 6. See chest CTA report for possible small nonocclusive subsegmental pulmonary embolism at the right lung base. Electronically Signed   By: Erica Hau M.D.   On: 11/21/2023 11:46   CT Angio Chest PE W and/or Wo Contrast Result Date: 11/21/2023 CLINICAL DATA:  Palpitations, left-sided chest heaviness, atrial fibrillation and nausea. EXAM: CT ANGIOGRAPHY CHEST WITH CONTRAST TECHNIQUE: Multidetector CT imaging of the chest was performed using the standard protocol during bolus administration of intravenous contrast. Multiplanar CT image reconstructions and MIPs were obtained to evaluate the vascular anatomy. RADIATION DOSE REDUCTION: This exam was performed according to the departmental dose-optimization program which includes automated exposure control, adjustment of the mA and/or kV according to patient size and/or use of iterative reconstruction technique. CONTRAST:  75mL OMNIPAQUE  IOHEXOL  350 MG/ML SOLN COMPARISON:  CT of the chest, abdomen and pelvis on 09/18/2023 FINDINGS: Cardiovascular: No significant pulmonary embolism identified. Within the posterior basilar right lower lobe, tiny nonocclusive subsegmental pulmonary embolism cannot be entirely excluded in a small branch but this appearance may be secondary to irregular opacification and respiratory motion. Top-normal heart size. Calcified coronary artery plaque present. No pleural effusion. Top-normal caliber of central pulmonary arteries with the main pulmonary artery measuring 2.9  cm. Atherosclerosis of the thoracic aorta without aneurysm. Mediastinum/Nodes: No enlarged mediastinal, hilar, or axillary lymph nodes. Thyroid gland, trachea, and esophagus demonstrate no significant findings. Lungs/Pleura: There is no evidence of pulmonary edema, consolidation, pneumothorax, nodule or pleural fluid. Upper Abdomen: No acute abnormality. Musculoskeletal: No chest wall abnormality. No acute or significant osseous findings. Review of the MIP images confirms the above findings. IMPRESSION: 1. No significant pulmonary embolism identified. Within the posterior basilar right lower lobe, tiny nonocclusive subsegmental pulmonary embolism cannot be entirely excluded in a small branch but this appearance may be secondary to irregular opacification and respiratory motion. 2. Calcified coronary artery plaque. 3. Aortic atherosclerosis. Aortic Atherosclerosis (ICD10-I70.0). Electronically Signed   By: Erica Hau M.D.   On: 11/21/2023 11:38   DG Chest Port 1 View Result Date: 11/21/2023 CLINICAL DATA:  Shortness of breath EXAM: PORTABLE CHEST 1 VIEW COMPARISON:  03/04/2023 FINDINGS: Stable cardiomediastinal contours. Decreased lung volumes. No pleural fluid or interstitial edema. No airspace opacities identified. Bilateral shoulder arthroplasty. IMPRESSION: Low lung volumes. No acute findings. Electronically Signed   By: Kimberley Penman M.D.   On: 11/21/2023 10:16   EKG: Independently reviewed. Sinus rhythm at 85 bpm.  Low voltage multiple leads.  Nonspecific T wave changes.  Minimal baseline wander.  Assessment/Plan Principal Problem:   Near syncope Active Problems:   Hypothyroidism   Hyperlipidemia   Essential hypertension   Dementia without behavioral disturbance (HCC)   History of pulmonary embolism   History of DVT of lower extremity   ANEMIA-IRON DEFICIENCY   GERD  Asthmatic bronchitis, mild intermittent, uncomplicated   Anxiety and depression   HX: breast cancer   PAF (paroxysmal  atrial fibrillation) (HCC)   Near-syncope Chest pain Palpitations > Patient presenting after episode of near syncope, chest pain, palpitations after she got up from her bed to go to the bathroom this morning. > Has history of chest pain with prior workup including clean cath in 2021.  Troponins here are negative and EDP discussed with cardiology who biceps symptoms are not consistent with cardiac etiology. > Does have history of prior PE as below but remains on Eliquis  and CTA in the ED was negative other than questionable small distal nonocclusive PE versus artifact. > Patient was reportedly hypoglycemic for EMS on their initial arrival but improved after glucose administration and has had no further hypoglycemia here. > This did occur in the setting of decreased p.o. intake per patient secondary to some nausea and abdominal pain.  No explanation for this on CT abdomen pelvis in the ED, patient did have positive orthostatic vital signs which may be the etiology of her symptoms (possibly combined with hypoglycemia) > Otherwise labs are largely stable other than hemoglobin of 9.7 which is down from 11 but negative FOBT.  TSH mildly elevated at 5.7 but this is improved from recent elevation to 7. - Monitor on telemetry overnight - Status post 1 L IV fluids in the ED, will hold off on further fluids given history of diastolic dysfunction on Lasix  - Hold Lasix  - Echocardiogram - Repeat orthostatics in the morning  Dementia - Continue home donepezil , Namenda , venlafaxine , duloxetine , quetiapine   Chronic abdominal pain > Following out patient for this. > No clear etiology on CT. - Noted  Hemorroids > Reports history of hemorrhoids and frequent blood in the bowl. > Hemoglobin is somewhat lower than baseline.  Will monitor closely.  FOBT negative in the ED. - Trend CBC  Hypertension - Hold Lasix  - Continue home metoprolol  - Replace and valsartan  formulary irbesartan   Hyperlipidemia - Not  currently on medication for this  Hypothyroidism - Continue home Synthroid   GERD - Continue PPI  Atrial fibrillation - Continue metoprolol  and Eliquis   History of PE History of DVT - Continue home Eliquis   History of G1DD > Last echo which was in 2024 showed EF 70-75%, indeterminate diastolic function, normal RV function. - Holding Lasix  as above - Repeat echocardiogram as above  History of breast cancer - Noted  Asthmatic bronchitis - Continue as needed albuterol   Anemia > Normocytic.  Down to 9.7 from previous baseline of around 11.  FOBT negative.  Type and screen pending. - Trend CBC.  DVT prophylaxis: Lovenox  Code Status:   Full Family Communication:  Updated at bedside  Disposition Plan:   Patient is from:  Home  Anticipated DC to:  Home  Anticipated DC date:  1 to 2 days  Anticipated DC barriers: None  Consults called:  None (briefly discussed with cardiology by EDP) Admission status:  Observation, telemetry  Severity of Illness: The appropriate patient status for this patient is OBSERVATION. Observation status is judged to be reasonable and necessary in order to provide the required intensity of service to ensure the patient's safety. The patient's presenting symptoms, physical exam findings, and initial radiographic and laboratory data in the context of their medical condition is felt to place them at decreased risk for further clinical deterioration. Furthermore, it is anticipated that the patient will be medically stable for discharge from the hospital within 2 midnights of admission.  Johnetta Nab MD [facility]  How to contact the [facility] Attending or Consulting provider 7A - 7P or covering provider during after hours 7P -7A, for this patient?   Check the care team in [facility] and look for a) attending/consulting [facility] provider listed and b) the [facility] team listed Log into [email or web address] and use [place]'s universal password to access. If you do not  have the password, please contact the hospital operator. Locate the [facility] provider you are looking for under [facility] and page to a number that you can be directly reached. If you still have difficulty reaching the provider, please page the [facility] (Director on Call) for the Hospitalists listed on amion for assistance.  11/21/2023, 2:07 PM

## 2023-11-22 ENCOUNTER — Observation Stay (HOSPITAL_COMMUNITY): Payer: MEDICARE

## 2023-11-22 DIAGNOSIS — R55 Syncope and collapse: Secondary | ICD-10-CM | POA: Diagnosis not present

## 2023-11-22 DIAGNOSIS — F0390 Unspecified dementia without behavioral disturbance: Secondary | ICD-10-CM | POA: Diagnosis not present

## 2023-11-22 DIAGNOSIS — I10 Essential (primary) hypertension: Secondary | ICD-10-CM | POA: Diagnosis not present

## 2023-11-22 DIAGNOSIS — R079 Chest pain, unspecified: Secondary | ICD-10-CM

## 2023-11-22 DIAGNOSIS — Z86718 Personal history of other venous thrombosis and embolism: Secondary | ICD-10-CM | POA: Diagnosis not present

## 2023-11-22 LAB — ECHOCARDIOGRAM COMPLETE
AR max vel: 2.08 cm2
AV Area VTI: 2.43 cm2
AV Area mean vel: 2.27 cm2
AV Mean grad: 3 mmHg
AV Peak grad: 5.8 mmHg
Ao pk vel: 1.2 m/s
Area-P 1/2: 3.2 cm2
Height: 63 in
S' Lateral: 3.9 cm
Weight: 3001.78 [oz_av]

## 2023-11-22 LAB — GLUCOSE, CAPILLARY: Glucose-Capillary: 118 mg/dL — ABNORMAL HIGH (ref 70–99)

## 2023-11-22 LAB — COMPREHENSIVE METABOLIC PANEL WITH GFR
ALT: 14 U/L (ref 0–44)
AST: 22 U/L (ref 15–41)
Albumin: 3 g/dL — ABNORMAL LOW (ref 3.5–5.0)
Alkaline Phosphatase: 45 U/L (ref 38–126)
Anion gap: 7 (ref 5–15)
BUN: 10 mg/dL (ref 8–23)
CO2: 21 mmol/L — ABNORMAL LOW (ref 22–32)
Calcium: 8.9 mg/dL (ref 8.9–10.3)
Chloride: 108 mmol/L (ref 98–111)
Creatinine, Ser: 1.15 mg/dL — ABNORMAL HIGH (ref 0.44–1.00)
GFR, Estimated: 48 mL/min — ABNORMAL LOW (ref >60)
Glucose, Bld: 113 mg/dL — ABNORMAL HIGH (ref 70–99)
Potassium: 4.1 mmol/L (ref 3.5–5.1)
Sodium: 136 mmol/L (ref 135–145)
Total Bilirubin: 0.4 mg/dL (ref 0.0–1.2)
Total Protein: 5.7 g/dL — ABNORMAL LOW (ref 6.5–8.1)

## 2023-11-22 LAB — CBC
HCT: 27.6 % — ABNORMAL LOW (ref 36.0–46.0)
Hemoglobin: 8.6 g/dL — ABNORMAL LOW (ref 12.0–15.0)
MCH: 27.8 pg (ref 26.0–34.0)
MCHC: 31.2 g/dL (ref 30.0–36.0)
MCV: 89.3 fL (ref 80.0–100.0)
Platelets: 271 10*3/uL (ref 150–400)
RBC: 3.09 MIL/uL — ABNORMAL LOW (ref 3.87–5.11)
RDW: 14.8 % (ref 11.5–15.5)
WBC: 9.5 10*3/uL (ref 4.0–10.5)
nRBC: 0 % (ref 0.0–0.2)

## 2023-11-22 MED ORDER — POLYETHYLENE GLYCOL 3350 17 G PO PACK
17.0000 g | PACK | Freq: Two times a day (BID) | ORAL | Status: DC
Start: 2023-11-22 — End: 2023-11-24
  Administered 2023-11-22 – 2023-11-23 (×3): 17 g via ORAL
  Filled 2023-11-22 (×3): qty 1

## 2023-11-22 NOTE — Care Management Obs Status (Signed)
 MEDICARE OBSERVATION STATUS NOTIFICATION   Patient Details  Name: Brenda Stephens MRN: [id number] Date of Birth: December 26, 1940   Medicare Observation Status Notification Given:  Yes    Ramsay Bognar G., RN 11/22/2023, 8:59 AM

## 2023-11-22 NOTE — Evaluation (Signed)
 Physical Therapy Evaluation Patient Details Name: Brenda Stephens MRN: [id number] DOB: 1941/01/05 Today's Date: 11/22/2023  History of Present Illness  83 y.o. female presenting to ED  via EMS 5/24 with chest pain, palpitations, near syncope and  hypoglycemia. Found to be fluid depleted. Improved orthostatic hypotension with fluid resuscitation. PMH: hypertension, hyperlipidemia, hypothyroidism, GERD, A-fib, diastolic dysfunction, anxiety, depression, dementia, breast cancer, PE, DVT, asthmatic bronchitis, anemia  Clinical Impression  PTA pt daughter living with her in multistory home with steps to enter and bed and bath on second floor. Pt later does state she could stay on first floor if she needed to. Pt reports independence/ use of cane with ambulation in home, daughter assists with ADLs and iADLs. Pt is currently limited in safe mobility by generalized weakness and mild insatiability in presence of baseline decreased endurance. Pt is min A for bed mobility, contact guard for transfers and supervision for ambulation in hallway. PT recommending OPPT at discharge. PT will continue to follow acutely.        If plan is discharge home, recommend the following: A little help with walking and/or transfers;A little help with bathing/dressing/bathroom;Assistance with cooking/housework;Direct supervision/assist for medications management;Direct supervision/assist for financial management;Assist for transportation;Help with stairs or ramp for entrance   Can travel by private vehicle   Yes     Equipment Recommendations None recommended by PT     Functional Status Assessment Patient has had a recent decline in their functional status and demonstrates the ability to make significant improvements in function in a reasonable and predictable amount of time.     Precautions / Restrictions Precautions Precautions: Fall Restrictions Weight Bearing Restrictions Per Provider Order: No      Mobility  Bed  Mobility Overal bed mobility: Modified Independent, Needs Assistance Bed Mobility: Sidelying to Sit, Rolling, Sit to Sidelying Rolling: Modified independent (Device/Increase time), Used rails Sidelying to sit: Modified independent (Device/Increase time), Used rails, Min assist     Sit to sidelying: Min assist General bed mobility comments: provided education on rolling into sidelying and pushing up to seated EoB, able to complete at a mod I level, does require min A for pad scoot fo hips towards EoB, min A for returning LE to bed    Transfers Overall transfer level: Needs assistance Equipment used: Rolling walker (2 wheels) Transfers: Sit to/from Stand Sit to Stand: Supervision, Min assist           General transfer comment: min A for cuing to hand placement for safe power up, supervision for safety for physically powering up to standing.    Ambulation/Gait Ambulation/Gait assistance: Supervision Gait Distance (Feet): 100 Feet Assistive device: Rolling walker (2 wheels) Gait Pattern/deviations: Step-through pattern, Trunk flexed, Shuffle Gait velocity: slowed Gait velocity interpretation: <1.8 ft/sec, indicate of risk for recurrent falls   General Gait Details: supervision for slowed, mildly unstable gait, however no overt LoB and improved with distance.  Stairs Stairs: Yes       General stair comments: given pt difficutly with energy conservation, had pt step tap trash can on its side x3 each leg, pt can sleep on first floor if she feels too fatigued from trip home      Balance Overall balance assessment: Mild deficits observed, not formally tested, Needs assistance Sitting-balance support: Feet supported, No upper extremity supported Sitting balance-Leahy Scale: Fair     Standing balance support: No upper extremity supported, During functional activity, Single extremity supported, Bilateral upper extremity supported Standing balance-Leahy Scale: Fair  Pertinent Vitals/Pain Pain Assessment Pain Assessment: No/denies pain    Home Living Family/patient expects to be discharged to:: Private residence Living Arrangements: Children Available Help at Discharge: Family;Available 24 hours/day Type of Home: House Home Access: Stairs to enter   [employer] of Steps: 2 Alternate Level Stairs-Number of Steps: stairs, landing, stairs Home Layout: Two level;Bed/bath upstairs;Able to live on main level with bedroom/bathroom Home Equipment: Rolling Walker (2 wheels);Rollator (4 wheels);BSC/3in1;Grab bars - toilet;Grab bars - tub/shower;Shower seat - built in Additional Comments: mainly stays on second floor for energy conservation    Prior Function Prior Level of Function : Needs assist             Mobility Comments: Modified independent with cane primarily, more use of RW in limited community distanced ADLs Comments: daughter assists with some ADLs (min) and IADLs     Extremity/Trunk Assessment   Upper Extremity Assessment Upper Extremity Assessment: Generalized weakness    Lower Extremity Assessment Lower Extremity Assessment: Generalized weakness    Cervical / Trunk Assessment Cervical / Trunk Assessment: Other exceptions Cervical / Trunk Exceptions: lumbar fx following up with physician next week  Communication   Communication Communication: No apparent difficulties    Cognition Arousal: Alert Behavior During Therapy: WFL for tasks assessed/performed                             Following commands: Intact       Cueing Cueing Techniques: Verbal cues, Visual cues, Tactile cues     General Comments General comments (skin integrity, edema, etc.): VSS with ambulationin hallway        Assessment/Plan    PT Assessment Patient needs continued PT services  PT Problem List Decreased strength;Decreased activity tolerance;Decreased balance;Decreased mobility;Decreased  safety awareness       PT Treatment Interventions DME instruction;Gait training;Stair training;Functional mobility training;Therapeutic activities;Therapeutic exercise;Balance training;Cognitive remediation;Patient/family education    PT Goals (Current goals can be found in the Care Plan section)  Acute Rehab PT Goals PT Goal Formulation: With patient/family Time For Goal Achievement: 12/06/23 Potential to Achieve Goals: Fair    Frequency Min 2X/week        AM-PAC PT "6 Clicks" Mobility  Outcome Measure Help needed turning from your back to your side while in a flat bed without using bedrails?: A Little Help needed moving from lying on your back to sitting on the side of a flat bed without using bedrails?: A Little Help needed moving to and from a bed to a chair (including a wheelchair)?: A Little Help needed standing up from a chair using your arms (e.g., wheelchair or bedside chair)?: A Little Help needed to walk in hospital room?: A Little Help needed climbing 3-5 steps with a railing? : A Lot 6 Click Score: 17    End of Session Equipment Utilized During Treatment: Gait belt Activity Tolerance: Patient limited by fatigue Patient left: in bed;with call bell/phone within reach;with bed alarm set Nurse Communication: Mobility status PT Visit Diagnosis: Unsteadiness on feet (R26.81);Muscle weakness (generalized) (M62.81);Difficulty in walking, not elsewhere classified (R26.2);Adult, failure to thrive (R62.7)    Time: 1191-4782 PT Time Calculation (min) (ACUTE ONLY): 30 min   Charges:   PT Evaluation $PT Eval Moderate Complexity: 1 Mod PT Treatments $Gait Training: 8-22 mins PT General Charges $$ ACUTE PT VISIT: 1 Visit         Brenda Stephens B. Brenda Stephens PT, DPT Acute Rehabilitation Services Please use secure chat  or  Call Office [phone]   Brenda Stephens 11/22/2023, 3:11 PM

## 2023-11-22 NOTE — Progress Notes (Signed)
 Mobility Specialist Progress Note:    11/22/23 1008  Mobility  Activity Transferred to/from [facility];Transferred from bed to chair  Level of Assistance Standby assist, set-up cues, supervision of patient - no hands on  Assistive Device BSC  Distance Ambulated (ft) 5 ft  Activity Response Tolerated well  Mobility Referral Yes  Mobility visit 1 Mobility  Mobility Specialist Start Time (ACUTE ONLY) 1008   Pt received on [facility], requesting assistance. Pt completed peri care independently and was agreeable to transfer to chair. Tolerated well, asx throughout. Left pt in chair with all needs met, call bell in reach. RN notified.    Brenda Stephens Mobility Specialist Please contact via Special educational needs teacher or  Rehab office at [phone]

## 2023-11-22 NOTE — Progress Notes (Signed)
 [facility] PROGRESS NOTE    Progress Note  Brenda Stephens  NWG:[id number] DOB: Jul 28, 1940 DOA: 11/21/2023 PCP: Mitchell Ana, PA-C     Brief Narrative:   Brenda Stephens is an 83 y.o. female past medical history significant for essential hypertension, hyperlipidemia, chronic atrial fibrillation on Eliquis , chronic diastolic dysfunction, dementia, history of breast cancer, PE DVT comes in with chest pain palpitation and a hypoglycemic episode  Assessment/Plan:   Near syncope: When EMS got to the home she was hypoglycemic but improved after glucose administration. He is also been having decreased oral intake in the setting of nausea and abdominal discomfort, CT scan of the abdomen pelvis showed no acute findings. She was orthostatic in the ED. Lasix  was held started on IV fluid resuscitation. 2D echo results are pending, last year showed an EF of 70% no regional wall motion abnormality no valve abnormality. After fluid resuscitation her orthostasis has resolved. PT OT evaluation is pending. Dementia: Continue benazepril, Namenda , venlafaxine , duloxetine , quetiapine .  Chronic abdominal pain: Noted follow-up PCP.  History of hemorrhoids: Reports frequent bouts of intermittent bloody stool. FOBT negative in the ED.  Chronic kidney disease stage IIIa: Her creatinine appears to be at baseline.  Essential hypertension: Continue metoprolol  and ARB, holding Lasix .  Hyperlipidemia: Continue statins.  Hypothyroidism: Continue Synthroid .  GERD: Continue PPI.  Chronic atrial fibrillation: Continue metoprolol  and Eliquis .  History of DVT and PE: Continue Eliquis .  Normocytic anemia: FOBT negative after hydration hemoglobin is 8.6 no signs of overt bleeding.    DVT prophylaxis: Eliquis  Family Communication:daughter Status is: Observation The patient remains OBS appropriate and will d/c before 2 midnights.    Code Status:     Code Status Orders  (From  admission, onward)           Start     Ordered   11/21/23 1401  Full code  Continuous       Question:  By:  Answer:  Consent: discussion documented in EHR   11/21/23 1405           Code Status History     Date Active Date Inactive Code Status Order ID Comments User Context   07/09/2023 2049 07/10/2023 2040 Full Code [id number]  Willadean Hark, MD Inpatient   02/11/2023 2049 02/17/2023 2029 Full Code [id number]  Unk Garb, DO ED   02/11/2023 2027 02/11/2023 2049 Full Code [id number]  Unk Garb, DO ED   07/15/2022 0320 07/17/2022 2208 Full Code [id number]  Roxana Copier, DO ED   06/30/2022 0809 07/05/2022 2018 Full Code [id number]  Lovell Rubenstein, NP ED   08/02/2019 1005 08/02/2019 1713 Full Code [id number]  [place], Peter M, MD Inpatient   01/12/2019 1350 01/13/2019 1425 Full Code [id number]  Micheline Ahr, MD Inpatient      Advance Directive Documentation    Flowsheet Row Most Recent Value  Type of Advance Directive Living will  Pre-existing out of facility DNR order (yellow form or pink MOST form) --  "MOST" Form in Place? --         IV Access:   Peripheral IV   Procedures and diagnostic studies:   CT ABDOMEN PELVIS W CONTRAST Result Date: 11/21/2023 CLINICAL DATA:  Abdominal pain. EXAM: CT ABDOMEN AND PELVIS WITH CONTRAST TECHNIQUE: Multidetector CT imaging of the abdomen and pelvis was performed using the standard protocol following bolus administration of intravenous contrast. RADIATION DOSE REDUCTION: This exam was performed according to the departmental dose-optimization program which includes automated exposure control, adjustment  of the mA and/or kV according to patient size and/or use of iterative reconstruction technique. CONTRAST:  75mL OMNIPAQUE  IOHEXOL  350 MG/ML SOLN COMPARISON:  CT a of the chest performed concurrently. See separate report. CTA of the abdomen and pelvis on 07/25/2023 and CT of the abdomen and pelvis on 09/18/2023. FINDINGS: Lower chest: Nonocclusive  subsegmental pulmonary embolism suggested by [employer] software. Pulmonary arteries are not well opacified on the abdominal CT. See chest CTA report for further description of findings. Hepatobiliary: No focal liver abnormality is seen. Status post cholecystectomy. Stable mild dilatation of the common bile duct status post cholecystectomy without visualized choledocholithiasis. The common bile duct measures up to approximately 9 mm. Pancreas: Unremarkable. No pancreatic ductal dilatation or surrounding inflammatory changes. Spleen: Normal in size without focal abnormality. Adrenals/Urinary Tract: Adrenal glands are unremarkable. Kidneys are normal, without renal calculi, focal lesion, or hydronephrosis. Bladder is unremarkable. Stomach/Bowel: Bowel shows no evidence of obstruction, ileus, inflammation or lesion. The appendix is surgically absent. No free intraperitoneal air. Stable mild diverticulosis of the sigmoid colon without evidence acute diverticulitis. Vascular/Lymphatic: Stable atherosclerosis of the abdominal aorta without aneurysm. No lymphadenopathy identified. Reproductive: Status post hysterectomy. No adnexal masses. Other: No abdominal wall hernia or abnormality. No abdominopelvic ascites. Musculoskeletal: Stable mild superior endplate compression of the L1 vertebral body. IMPRESSION: 1. No acute findings in the abdomen or pelvis. 2. Stable mild dilatation of the common bile duct status post cholecystectomy without visualized choledocholithiasis. 3. Stable mild diverticulosis of the sigmoid colon without evidence of acute diverticulitis. 4. Stable mild superior endplate compression of the L1 vertebral body. 5. Aortic atherosclerosis. 6. See chest CTA report for possible small nonocclusive subsegmental pulmonary embolism at the right lung base. Electronically Signed   By: Erica Hau M.D.   On: 11/21/2023 11:46   CT Angio Chest PE W and/or Wo Contrast Result Date: 11/21/2023 CLINICAL DATA:   Palpitations, left-sided chest heaviness, atrial fibrillation and nausea. EXAM: CT ANGIOGRAPHY CHEST WITH CONTRAST TECHNIQUE: Multidetector CT imaging of the chest was performed using the standard protocol during bolus administration of intravenous contrast. Multiplanar CT image reconstructions and MIPs were obtained to evaluate the vascular anatomy. RADIATION DOSE REDUCTION: This exam was performed according to the departmental dose-optimization program which includes automated exposure control, adjustment of the mA and/or kV according to patient size and/or use of iterative reconstruction technique. CONTRAST:  75mL OMNIPAQUE  IOHEXOL  350 MG/ML SOLN COMPARISON:  CT of the chest, abdomen and pelvis on 09/18/2023 FINDINGS: Cardiovascular: No significant pulmonary embolism identified. Within the posterior basilar right lower lobe, tiny nonocclusive subsegmental pulmonary embolism cannot be entirely excluded in a small branch but this appearance may be secondary to irregular opacification and respiratory motion. Top-normal heart size. Calcified coronary artery plaque present. No pleural effusion. Top-normal caliber of central pulmonary arteries with the main pulmonary artery measuring 2.9 cm. Atherosclerosis of the thoracic aorta without aneurysm. Mediastinum/Nodes: No enlarged mediastinal, hilar, or axillary lymph nodes. Thyroid gland, trachea, and esophagus demonstrate no significant findings. Lungs/Pleura: There is no evidence of pulmonary edema, consolidation, pneumothorax, nodule or pleural fluid. Upper Abdomen: No acute abnormality. Musculoskeletal: No chest wall abnormality. No acute or significant osseous findings. Review of the MIP images confirms the above findings. IMPRESSION: 1. No significant pulmonary embolism identified. Within the posterior basilar right lower lobe, tiny nonocclusive subsegmental pulmonary embolism cannot be entirely excluded in a small branch but this appearance may be secondary to  irregular opacification and respiratory motion. 2. Calcified coronary artery plaque. 3. Aortic atherosclerosis. Aortic  Atherosclerosis (ICD10-I70.0). Electronically Signed   By: Erica Hau M.D.   On: 11/21/2023 11:38   DG Chest Port 1 View Result Date: 11/21/2023 CLINICAL DATA:  Shortness of breath EXAM: PORTABLE CHEST 1 VIEW COMPARISON:  03/04/2023 FINDINGS: Stable cardiomediastinal contours. Decreased lung volumes. No pleural fluid or interstitial edema. No airspace opacities identified. Bilateral shoulder arthroplasty. IMPRESSION: Low lung volumes. No acute findings. Electronically Signed   By: Kimberley Penman M.D.   On: 11/21/2023 10:16     Medical Consultants:   None.   Subjective:    JERLYN PAIN No complains  Objective:    Vitals:   11/21/23 1929 11/21/23 2338 11/22/23 0450 11/22/23 0500  BP: (!) 147/84 (!) 140/67 (!) 112/55   Pulse: 83 67 62 (!) 56  Resp: 19 20 18    Temp: 97.9 F (36.6 C) 98.2 F (36.8 C) 98.1 F (36.7 C)   TempSrc: Oral Oral Oral   SpO2: 95% 93% 94% 94%  Weight:    85.1 kg  Height:       SpO2: 94 %   Intake/Output Summary (Last 24 hours) at 11/22/2023 0706 Last data filed at 11/21/2023 2201 Gross per 24 hour  Intake 1200 ml  Output --  Net 1200 ml   Filed Weights   11/21/23 0850 11/22/23 0500  Weight: 81.6 kg 85.1 kg    Exam: General exam: In no acute distress. Respiratory system: Good air movement and clear to auscultation. Cardiovascular system: S1 & S2 heard, RRR. No JVD. Gastrointestinal system: Abdomen is nondistended, soft and nontender.  Extremities: No pedal edema. Skin: No rashes, lesions or ulcers Psychiatry: Judgement and insight appear normal. Mood & affect appropriate.    Data Reviewed:    Labs: Basic Metabolic Panel: Recent Labs  Lab 11/21/23 0855 11/22/23 0359  NA 140 136  K 3.8 4.1  CL 105 108  CO2 22 21*  GLUCOSE 186* 113*  BUN 11 10  CREATININE 1.07* 1.15*  CALCIUM  9.7 8.9  MG 2.0  --     GFR Estimated Creatinine Clearance: 39 mL/min (A) (by C-G formula based on SCr of 1.15 mg/dL (H)). Liver Function Tests: Recent Labs  Lab 11/21/23 0855 11/22/23 0359  AST 26 22  ALT 16 14  ALKPHOS 53 45  BILITOT 0.9 0.4  PROT 6.1* 5.7*  ALBUMIN 3.4* 3.0*   Recent Labs  Lab 11/21/23 0855  LIPASE 25   No results for input(s): "AMMONIA" in the last 168 hours. Coagulation profile No results for input(s): "INR", "PROTIME" in the last 168 hours. COVID-19 Labs  No results for input(s): "DDIMER", "FERRITIN", "LDH", "CRP" in the last 72 hours.  Lab Results  Component Value Date   SARSCOV2NAA NEGATIVE 06/30/2022   SARSCOV2NAA NEGATIVE 07/29/2019   SARSCOV2NAA Not Detected 06/22/2019   SARSCOV2NAA NEGATIVE 01/08/2019    CBC: Recent Labs  Lab 11/21/23 0855 11/22/23 0359  WBC 8.5 9.5  HGB 9.7* 8.6*  HCT 30.9* 27.6*  MCV 89.8 89.3  PLT 292 271   Cardiac Enzymes: No results for input(s): "CKTOTAL", "CKMB", "CKMBINDEX", "TROPONINI" in the last 168 hours. BNP (last 3 results) No results for input(s): "PROBNP" in the last 8760 hours. CBG: Recent Labs  Lab 11/21/23 0906 11/22/23 0512  GLUCAP 172* 118*   D-Dimer: No results for input(s): "DDIMER" in the last 72 hours. Hgb A1c: No results for input(s): "HGBA1C" in the last 72 hours. Lipid Profile: No results for input(s): "CHOL", "HDL", "LDLCALC", "TRIG", "CHOLHDL", "LDLDIRECT" in the last 72 hours. Thyroid  function studies: Recent Labs    11/21/23 0855  TSH 5.770*   Anemia work up: No results for input(s): "VITAMINB12", "FOLATE", "FERRITIN", "TIBC", "IRON", "RETICCTPCT" in the last 72 hours. Sepsis Labs: Recent Labs  Lab 11/21/23 0855 11/22/23 0359  WBC 8.5 9.5   Microbiology No results found for this or any previous visit (from the past 240 hours).   Medications:    apixaban   5 mg Oral BID   donepezil   10 mg Oral QHS   DULoxetine   120 mg Oral Daily   irbesartan   75 mg Oral Daily   levothyroxine    12.5 mcg Oral Q0600   memantine   10 mg Oral BID   metoprolol  succinate  50 mg Oral QHS   oxybutynin   5 mg Oral BID   pantoprazole   40 mg Oral Daily   QUEtiapine   50 mg Oral QHS   sodium chloride  flush  3 mL Intravenous Q12H   tamsulosin   0.4 mg Oral QHS   venlafaxine  XR  150 mg Oral Daily   Continuous Infusions:    LOS: 0 days   Macdonald Savoy  [facility]  11/22/2023, 7:06 AM

## 2023-11-23 DIAGNOSIS — R55 Syncope and collapse: Secondary | ICD-10-CM | POA: Diagnosis not present

## 2023-11-23 LAB — GLUCOSE, CAPILLARY: Glucose-Capillary: 120 mg/dL — ABNORMAL HIGH (ref 70–99)

## 2023-11-23 MED ORDER — FUROSEMIDE 20 MG PO TABS
20.0000 mg | ORAL_TABLET | Freq: Every day | ORAL | 3 refills | Status: DC
Start: 2023-11-26 — End: 2024-01-24

## 2023-11-23 MED ORDER — POTASSIUM CHLORIDE CRYS ER 20 MEQ PO TBCR: 20.0000 meq | EXTENDED_RELEASE_TABLET | Freq: Every day | ORAL | 3 refills | Status: AC

## 2023-11-23 NOTE — Discharge Summary (Signed)
 Physician Discharge Summary  Brenda Stephens ZOX:[id number] DOB: [birth year] DOA: 11/21/2023  PCP: Mitchell Ana, PA-C  Admit date: 11/21/2023 Discharge date: 11/23/2023  Admitted From: Home Disposition:  Home  Recommendations for Outpatient Follow-up:  Follow up with PCP in 1-2 weeks Please obtain BMP/CBC in 2 weeks  Home Health:Yes Equipment/Devices:None  Discharge Condition:Stable CODE STATUS:Full Diet recommendation: Heart Healthy   Brief/Interim Summary: 83 y.o. female past medical history significant for essential hypertension, hyperlipidemia, chronic atrial fibrillation on Eliquis , chronic diastolic dysfunction, dementia, history of breast cancer, PE DVT comes in with chest pain palpitation and a hypoglycemic episode   Discharge Diagnoses:  Principal Problem:   Near syncope Active Problems:   Hypothyroidism   Hyperlipidemia   Essential hypertension   Dementia without behavioral disturbance (HCC)   History of pulmonary embolism   History of DVT of lower extremity   ANEMIA-IRON DEFICIENCY   GERD   Asthmatic bronchitis, mild intermittent, uncomplicated   Anxiety and depression   HX: breast cancer   PAF (paroxysmal atrial fibrillation) (HCC)  Near syncope: When EMS got to her house she was found to be hypoglycemic which improved quickly with glucose administration. The patient relates she was having decreased oral intake in the setting of nausea and vomiting and abdominal pain CT scan of the abdomen showed no acute findings. She was found orthostatic in the ED. Lasix  was held she was fluid resuscitated orthostasis resolved. 2D echo showed no aortic stenosis preserved EF. PT evaluated the patient will need home health PT.  Dementia: No changes made to her medication.  Chronic abdominal pain: Has had abdominal pain for years follow-up with PCP as an outpatient.  History of hemorrhoids/normocytic anemia: Reports frequent bouts of intermittent bloody stools. FOBT  in the ED was negative, her hemoglobin remained stable.  Chronic disease stage IIIa: Noted.  Essential hypertension: Initially antihypertensive medications were  held after fluid resuscitation ARB and metoprolol  was started she will restart Lasix  as an outpatient.  Hyperlipidemia: Continue statins.  Hypothyroidism: Continue Synthroid  she relates she had been several doses her TSH is improving. There needs to be more compliance with her Synthroid  I did speak to the daughter about this that she needs to take this on an empty stomach for at least 2 hours before and after meals.  GERD: Continue PPI.  Chronic atrial fibrillation: Continue metoprolol  and Eliquis .  History of DVT and PE: Continue Eliquis .    Discharge Instructions  Discharge Instructions     Diet - low sodium heart healthy   Complete by: As directed    Increase activity slowly   Complete by: As directed       Allergies as of 11/23/2023       Reactions   Latex Rash   Sulfa  Antibiotics Anaphylaxis, Nausea Only   Aspirin  Other (See Comments)   GI bleeding   Nsaids Other (See Comments)   GI upset    Oxycodone -acetaminophen  Nausea Only        Medication List     STOP taking these medications    meclizine  25 MG tablet Commonly known as: ANTIVERT        TAKE these medications    acetaminophen  650 MG CR tablet Commonly known as: TYLENOL  Take 1 tablet (650 mg total) by mouth every 8 (eight) hours as needed for pain (headaches). What changed:  how much to take reasons to take this   apixaban  5 MG Tabs tablet Commonly known as: ELIQUIS  Take 1 tablet (5 mg total) by mouth 2 (  two) times daily.   benzonatate  200 MG capsule Commonly known as: TESSALON  TAKE 1 CAPSULE (200 MG TOTAL) BY MOUTH 3 (THREE) TIMES DAILY AS NEEDED FOR COUGH.   Biotin  10000 MCG Tabs Take 10,000 mcg by mouth daily.   cetirizine 10 MG tablet Commonly known as: ZYRTEC Take 10 mg by mouth at bedtime.   donepezil  10 MG  tablet Commonly known as: ARICEPT  Take 10 mg by mouth at bedtime.   donepezil  23 MG Tabs tablet Commonly known as: ARICEPT  Take 23 mg by mouth daily.   DULoxetine  60 MG capsule Commonly known as: CYMBALTA  Take 120 mg by mouth daily.   furosemide  20 MG tablet Commonly known as: LASIX  Take 1 tablet (20 mg total) by mouth daily. Start taking on: Nov 26, 2023 What changed: These instructions start on Nov 26, 2023. If you are unsure what to do until then, ask your doctor or other care provider.   hydrOXYzine  25 MG tablet Commonly known as: ATARAX  Take 1 tablet (25 mg total) by mouth every 6 (six) hours as needed for anxiety.   levothyroxine  25 MCG tablet Commonly known as: SYNTHROID  Take 12.5 mcg by mouth daily before breakfast.   memantine  10 MG tablet Commonly known as: NAMENDA  Take 10 mg by mouth 2 (two) times daily.   metoprolol  succinate 50 MG 24 hr tablet Commonly known as: Toprol  XL Take 1 tablet (50 mg total) by mouth at bedtime.   nitroGLYCERIN  0.4 MG SL tablet Commonly known as: NITROSTAT  Place 0.4 mg under the tongue as needed.   ondansetron  4 MG disintegrating tablet Commonly known as: ZOFRAN -ODT Take 4 mg by mouth every 8 (eight) hours as needed for nausea or vomiting.   oxybutynin  5 MG tablet Commonly known as: DITROPAN  Take 5 mg by mouth 2 (two) times daily.   pantoprazole  40 MG tablet Commonly known as: PROTONIX  Take 1 tablet (40 mg total) by mouth daily.   potassium chloride  SA 20 MEQ tablet Commonly known as: KLOR-CON  M Take 1 tablet (20 mEq total) by mouth daily. Start taking on: Nov 26, 2023 What changed: These instructions start on Nov 26, 2023. If you are unsure what to do until then, ask your doctor or other care provider.   QUEtiapine  50 MG tablet Commonly known as: SEROQUEL  Take 50 mg by mouth at bedtime.   sennosides-docusate sodium  8.6-50 MG tablet Commonly known as: SENOKOT-S Take 1 tablet by mouth daily as needed for  constipation.   SYSTANE OP Place 1 drop into both eyes 2 (two) times daily as needed (for dryness).   tamsulosin  0.4 MG Caps capsule Commonly known as: FLOMAX  Take 0.4 mg by mouth at bedtime.   traMADol 50 MG tablet Commonly known as: ULTRAM Take 50 mg by mouth 2 (two) times daily as needed.   valsartan  80 MG tablet Commonly known as: DIOVAN  TAKE 1 TABLET BY MOUTH EVERY DAY What changed: how much to take   venlafaxine  XR 150 MG 24 hr capsule Commonly known as: EFFEXOR -XR TAKE 1 CAPSULE (150 MG TOTAL) BY MOUTH DAILY.   Ventolin  HFA 108 (90 Base) MCG/ACT inhaler Generic drug: albuterol  TAKE 2 PUFFS BY MOUTH EVERY 6 HOURS AS NEEDED FOR WHEEZE OR SHORTNESS OF BREATH        Allergies  Allergen Reactions   Latex Rash   Sulfa  Antibiotics Anaphylaxis and Nausea Only   Aspirin  Other (See Comments)    GI bleeding   Nsaids Other (See Comments)    GI upset    Oxycodone -Acetaminophen   Nausea Only    Consultations: None   Procedures/Studies: ECHOCARDIOGRAM COMPLETE Result Date: 11/22/2023    ECHOCARDIOGRAM REPORT   Patient Name:   Brenda Stephens Date of Exam: 11/22/2023 Medical Rec #:  [id number]       Height:       63.0 in Accession #:    [id number]      Weight:       187.6 lb Date of Birth:  11/12/1940       BSA:          1.882 m Patient Age:    83 years        BP:           112/55 mmHg Patient Gender: F               HR:           74 bpm. Exam Location:  Inpatient Procedure: 2D Echo, Cardiac Doppler and Color Doppler (Both Spectral and Color            Flow Doppler were utilized during procedure). Indications:    Chest Pain R07.9  History:        Patient has prior history of Echocardiogram examinations, most                 recent 02/12/2023. Arrythmias:Atrial Fibrillation; Risk                 Factors:Hypertension.  Sonographer:    Astrid Blamer Referring Phys: [id number] Gayl Katos MELVIN IMPRESSIONS  1. Left ventricular ejection fraction, by estimation, is 60 to 65%. The left  ventricle has normal function. The left ventricle has no regional wall motion abnormalities. There is mild left ventricular hypertrophy. Left ventricular diastolic parameters are consistent with Grade II diastolic dysfunction (pseudonormalization).  2. Right ventricular systolic function is normal. The right ventricular size is normal.  3. The mitral valve is normal in structure. Mild mitral valve regurgitation. No evidence of mitral stenosis.  4. The aortic valve is tricuspid. Aortic valve regurgitation is not visualized. No aortic stenosis is present.  5. The inferior vena cava is normal in size with greater than 50% respiratory variability, suggesting right atrial pressure of 3 mmHg. FINDINGS  Left Ventricle: Left ventricular ejection fraction, by estimation, is 60 to 65%. The left ventricle has normal function. The left ventricle has no regional wall motion abnormalities. The left ventricular internal cavity size was normal in size. There is  mild left ventricular hypertrophy. Left ventricular diastolic parameters are consistent with Grade II diastolic dysfunction (pseudonormalization). Right Ventricle: The right ventricular size is normal. Right ventricular systolic function is normal. Left Atrium: Left atrial size was normal in size. Right Atrium: Right atrial size was normal in size. Pericardium: There is no evidence of pericardial effusion. Mitral Valve: The mitral valve is normal in structure. Mild mitral annular calcification. Mild mitral valve regurgitation. No evidence of mitral valve stenosis. Tricuspid Valve: The tricuspid valve is normal in structure. Tricuspid valve regurgitation is trivial. No evidence of tricuspid stenosis. Aortic Valve: The aortic valve is tricuspid. Aortic valve regurgitation is not visualized. No aortic stenosis is present. Aortic valve mean gradient measures 3.0 mmHg. Aortic valve peak gradient measures 5.8 mmHg. Aortic valve area, by VTI measures 2.43 cm. Pulmonic Valve: The  pulmonic valve was not well visualized. Pulmonic valve regurgitation is not visualized. No evidence of pulmonic stenosis. Aorta: The aortic root is normal in size and structure. Venous: The inferior vena cava is normal in  size with greater than 50% respiratory variability, suggesting right atrial pressure of 3 mmHg. IAS/Shunts: The interatrial septum was not well visualized.  LEFT VENTRICLE PLAX 2D LVIDd:         4.90 cm   Diastology LVIDs:         3.90 cm   LV e' medial:    6.09 cm/s LV PW:         0.80 cm   LV E/e' medial:  17.9 LV IVS:        0.90 cm   LV e' lateral:   5.66 cm/s LVOT diam:     1.90 cm   LV E/e' lateral: 19.3 LV SV:         62 LV SV Index:   33 LVOT Area:     2.84 cm  RIGHT VENTRICLE RV S prime:     16.30 cm/s TAPSE (M-mode): 2.0 cm LEFT ATRIUM           Index        RIGHT ATRIUM           Index LA Vol (A2C): 37.0 ml 19.66 ml/m  RA Area:     12.30 cm LA Vol (A4C): 45.4 ml 24.13 ml/m  RA Volume:   28.70 ml  15.25 ml/m  AORTIC VALVE AV Area (Vmax):    2.08 cm AV Area (Vmean):   2.27 cm AV Area (VTI):     2.43 cm AV Vmax:           120.00 cm/s AV Vmean:          83.400 cm/s AV VTI:            0.254 m AV Peak Grad:      5.8 mmHg AV Mean Grad:      3.0 mmHg LVOT Vmax:         88.20 cm/s LVOT Vmean:        66.800 cm/s LVOT VTI:          0.218 m LVOT/AV VTI ratio: 0.86  AORTA Ao Root diam: 2.70 cm MITRAL VALVE                TRICUSPID VALVE MV Area (PHT): 3.20 cm     TR Peak grad:   14.0 mmHg MV Decel Time: 237 msec     TR Vmax:        187.00 cm/s MV E velocity: 109.00 cm/s MV A velocity: 88.00 cm/s   SHUNTS MV E/A ratio:  1.24         Systemic VTI:  0.22 m                             Systemic Diam: 1.90 cm Alexandria Angel MD Electronically signed by Alexandria Angel MD Signature Date/Time: 11/22/2023/11:04:32 AM    Final    CT ABDOMEN PELVIS W CONTRAST Result Date: 11/21/2023 CLINICAL DATA:  Abdominal pain. EXAM: CT ABDOMEN AND PELVIS WITH CONTRAST TECHNIQUE: Multidetector CT imaging of the  abdomen and pelvis was performed using the standard protocol following bolus administration of intravenous contrast. RADIATION DOSE REDUCTION: This exam was performed according to the departmental dose-optimization program which includes automated exposure control, adjustment of the mA and/or kV according to patient size and/or use of iterative reconstruction technique. CONTRAST:  75mL OMNIPAQUE  IOHEXOL  350 MG/ML SOLN COMPARISON:  CT a of the chest performed concurrently. See separate report. CTA of the abdomen and pelvis on 07/25/2023  and CT of the abdomen and pelvis on 09/18/2023. FINDINGS: Lower chest: Nonocclusive subsegmental pulmonary embolism suggested by [employer] software. Pulmonary arteries are not well opacified on the abdominal CT. See chest CTA report for further description of findings. Hepatobiliary: No focal liver abnormality is seen. Status post cholecystectomy. Stable mild dilatation of the common bile duct status post cholecystectomy without visualized choledocholithiasis. The common bile duct measures up to approximately 9 mm. Pancreas: Unremarkable. No pancreatic ductal dilatation or surrounding inflammatory changes. Spleen: Normal in size without focal abnormality. Adrenals/Urinary Tract: Adrenal glands are unremarkable. Kidneys are normal, without renal calculi, focal lesion, or hydronephrosis. Bladder is unremarkable. Stomach/Bowel: Bowel shows no evidence of obstruction, ileus, inflammation or lesion. The appendix is surgically absent. No free intraperitoneal air. Stable mild diverticulosis of the sigmoid colon without evidence acute diverticulitis. Vascular/Lymphatic: Stable atherosclerosis of the abdominal aorta without aneurysm. No lymphadenopathy identified. Reproductive: Status post hysterectomy. No adnexal masses. Other: No abdominal wall hernia or abnormality. No abdominopelvic ascites. Musculoskeletal: Stable mild superior endplate compression of the L1 vertebral body. IMPRESSION: 1. No  acute findings in the abdomen or pelvis. 2. Stable mild dilatation of the common bile duct status post cholecystectomy without visualized choledocholithiasis. 3. Stable mild diverticulosis of the sigmoid colon without evidence of acute diverticulitis. 4. Stable mild superior endplate compression of the L1 vertebral body. 5. Aortic atherosclerosis. 6. See chest CTA report for possible small nonocclusive subsegmental pulmonary embolism at the right lung base. Electronically Signed   By: Erica Hau M.D.   On: 11/21/2023 11:46   CT Angio Chest PE W and/or Wo Contrast Result Date: 11/21/2023 CLINICAL DATA:  Palpitations, left-sided chest heaviness, atrial fibrillation and nausea. EXAM: CT ANGIOGRAPHY CHEST WITH CONTRAST TECHNIQUE: Multidetector CT imaging of the chest was performed using the standard protocol during bolus administration of intravenous contrast. Multiplanar CT image reconstructions and MIPs were obtained to evaluate the vascular anatomy. RADIATION DOSE REDUCTION: This exam was performed according to the departmental dose-optimization program which includes automated exposure control, adjustment of the mA and/or kV according to patient size and/or use of iterative reconstruction technique. CONTRAST:  75mL OMNIPAQUE  IOHEXOL  350 MG/ML SOLN COMPARISON:  CT of the chest, abdomen and pelvis on 09/18/2023 FINDINGS: Cardiovascular: No significant pulmonary embolism identified. Within the posterior basilar right lower lobe, tiny nonocclusive subsegmental pulmonary embolism cannot be entirely excluded in a small branch but this appearance may be secondary to irregular opacification and respiratory motion. Top-normal heart size. Calcified coronary artery plaque present. No pleural effusion. Top-normal caliber of central pulmonary arteries with the main pulmonary artery measuring 2.9 cm. Atherosclerosis of the thoracic aorta without aneurysm. Mediastinum/Nodes: No enlarged mediastinal, hilar, or axillary  lymph nodes. Thyroid  gland, trachea, and esophagus demonstrate no significant findings. Lungs/Pleura: There is no evidence of pulmonary edema, consolidation, pneumothorax, nodule or pleural fluid. Upper Abdomen: No acute abnormality. Musculoskeletal: No chest wall abnormality. No acute or significant osseous findings. Review of the MIP images confirms the above findings. IMPRESSION: 1. No significant pulmonary embolism identified. Within the posterior basilar right lower lobe, tiny nonocclusive subsegmental pulmonary embolism cannot be entirely excluded in a small branch but this appearance may be secondary to irregular opacification and respiratory motion. 2. Calcified coronary artery plaque. 3. Aortic atherosclerosis. Aortic Atherosclerosis (ICD10-I70.0). Electronically Signed   By: Erica Hau M.D.   On: 11/21/2023 11:38   DG Chest Port 1 View Result Date: 11/21/2023 CLINICAL DATA:  Shortness of breath EXAM: PORTABLE CHEST 1 VIEW COMPARISON:  03/04/2023 FINDINGS: Stable cardiomediastinal contours.  Decreased lung volumes. No pleural fluid or interstitial edema. No airspace opacities identified. Bilateral shoulder arthroplasty. IMPRESSION: Low lung volumes. No acute findings. Electronically Signed   By: Kimberley Penman M.D.   On: 11/21/2023 10:16   MR CERVICAL SPINE WO CONTRAST Result Date: 11/17/2023 CLINICAL DATA:  Neck pain EXAM: MRI CERVICAL SPINE WITHOUT CONTRAST TECHNIQUE: Multiplanar, multisequence MR imaging of the cervical spine was performed. No intravenous contrast was administered. COMPARISON:  MRI of the cervical spine dated 04/08/2021 FINDINGS: Alignment: Mild retrolisthesis of C3 on C4. Grade 1 anterolisthesis of C4 on C5 and C7 on T1 Vertebrae: Degenerative endplate marrow changes at a few levels. No acute fracture is identified. Cord: Normal signal and morphology. Posterior Fossa, vertebral arteries, paraspinal tissues: The visualized portions of the skull base and the posterior fossa are  normal. No soft tissue abnormality is identified. Disc levels: C2-C3: The disk is normal in configuration. No facet arthropathy. No uncovertebral joint disease. No neuroforaminal stenosis. No spinal canal stenosis. C3-C4: Disc osteophyte complex. Severe left and moderate right facet arthropathy. Severe bilateral uncovertebral joint disease. Severe bilateral neuroforaminal stenosis. No spinal canal stenosis. C4-C5: Disc osteophyte complex. Severe left and moderate right facet arthropathy. No uncovertebral joint disease. No neuroforaminal stenosis. No spinal canal stenosis. C5-C6: Disc osteophyte complex. Severe bilateral facet arthropathy. Moderate bilateral uncovertebral joint disease. Moderate bilateral neuroforaminal stenosis. Mild spinal canal stenosis. C6-C7: Disc osteophyte complex. Mild bilateral facet arthropathy. No uncovertebral joint disease. No neuroforaminal stenosis. No spinal canal stenosis. C7-T1: The disk is normal in configuration. Severe bilateral facet arthropathy. No uncovertebral joint disease. No neuroforaminal stenosis. No spinal canal stenosis. Severe foraminal stenoses bilaterally at T2-T3 secondary to disc bulging and facet arthropathy. IMPRESSION: 1. Severe foraminal stenoses bilaterally at C3-C4 and T2-T3 secondary to disc bulging and facet arthropathy. 2. Moderate foraminal stenoses bilaterally at C5-C6. Electronically Signed   By: Johnanna Mylar M.D.   On: 11/17/2023 16:07   MR LUMBAR SPINE WO CONTRAST Result Date: 11/17/2023 CLINICAL DATA:  Low back pain EXAM: MRI LUMBAR SPINE WITHOUT CONTRAST TECHNIQUE: Multiplanar, multisequence MR imaging of the lumbar spine was performed. No intravenous contrast was administered. COMPARISON:  MRI of the lumbar spine dated 08/29/2013 FINDINGS: Segmentation: Standard. Alignment:  Physiologic lumbar alignment is maintained. Vertebrae: Degenerative endplate marrow changes at a few levels. Compression/wedge fracture at L1 with associated edema  suggesting subacute injury. There is a proximally 50% height loss anteriorly. Conus medullaris and cauda equina: The conus medullaris terminates at the level of L1-L2. The distal spinal cord signal intensity is normal. Paraspinal and other soft tissues: Renal cysts bilaterally. The visualized aorta is normal. Disc levels: L1-L2: Disc is normal in configuration. Mild bilateral facet arthropathy. No neuroforaminal stenosis. No spinal canal stenosis. L2-L3: Disc bulge. Mild bilateral facet arthropathy. Mild right neuroforaminal stenosis. Mild spinal canal stenosis. L3-L4: Disc bulge. Moderate bilateral facet arthropathy. Mild bilateral neuroforaminal stenosis. Mild spinal canal stenosis. L4-L5: Disc bulge. Mild bilateral facet arthropathy. Mild bilateral neuroforaminal stenosis. No spinal canal stenosis. L5-S1: Disc is normal in configuration. Mild bilateral facet arthropathy. Mild bilateral neuroforaminal stenosis. No spinal canal stenosis. IMPRESSION: 1. Subacute compression/wedge fracture at L1 with approximately 50% height loss anteriorly. 2. Mild canal stenosis at L2-L3 and L3-L4 secondary to disc bulging and facet arthropathy. 3. Mild foraminal stenoses at multiple levels. Electronically Signed   By: Johnanna Mylar M.D.   On: 11/17/2023 16:05   (Echo, Carotid, EGD, Colonoscopy, ERCP)    Subjective: No complaints  Discharge Exam: Vitals:   11/23/23  0553 11/23/23 0753  BP:  (!) 134/56  Pulse:  64  Resp: 20 20  Temp:  98 F (36.7 C)  SpO2:  95%   Vitals:   11/22/23 2319 11/23/23 0333 11/23/23 0553 11/23/23 0753  BP: (!) 148/68 (!) 110/40  (!) 134/56  Pulse: 80 62  64  Resp: 20 20 20 20   Temp: 98.3 F (36.8 C) 97.7 F (36.5 C)  98 F (36.7 C)  TempSrc: Oral Oral  Oral  SpO2: 94% 96%  95%  Weight:   83.1 kg   Height:        General: Pt is alert, awake, not in acute distress Cardiovascular: RRR, S1/S2 +, no rubs, no gallops Respiratory: CTA bilaterally, no wheezing, no  rhonchi Abdominal: Soft, NT, ND, bowel sounds + Extremities: no edema, no cyanosis    The results of significant diagnostics from this hospitalization (including imaging, microbiology, ancillary and laboratory) are listed below for reference.     Microbiology: No results found for this or any previous visit (from the past 240 hours).   Labs: BNP (last 3 results) Recent Labs    02/11/23 1820 03/04/23 1014  BNP 202.3* 94.1   Basic Metabolic Panel: Recent Labs  Lab 11/21/23 0855 11/22/23 0359  NA 140 136  K 3.8 4.1  CL 105 108  CO2 22 21*  GLUCOSE 186* 113*  BUN 11 10  CREATININE 1.07* 1.15*  CALCIUM  9.7 8.9  MG 2.0  --    Liver Function Tests: Recent Labs  Lab 11/21/23 0855 11/22/23 0359  AST 26 22  ALT 16 14  ALKPHOS 53 45  BILITOT 0.9 0.4  PROT 6.1* 5.7*  ALBUMIN 3.4* 3.0*   Recent Labs  Lab 11/21/23 0855  LIPASE 25   No results for input(s): "AMMONIA" in the last 168 hours. CBC: Recent Labs  Lab 11/21/23 0855 11/22/23 0359  WBC 8.5 9.5  HGB 9.7* 8.6*  HCT 30.9* 27.6*  MCV 89.8 89.3  PLT 292 271   Cardiac Enzymes: No results for input(s): "CKTOTAL", "CKMB", "CKMBINDEX", "TROPONINI" in the last 168 hours. BNP: Invalid input(s): "POCBNP" CBG: Recent Labs  Lab 11/21/23 0906 11/22/23 0512 11/23/23 0536  GLUCAP 172* 118* 120*   D-Dimer No results for input(s): "DDIMER" in the last 72 hours. Hgb A1c No results for input(s): "HGBA1C" in the last 72 hours. Lipid Profile No results for input(s): "CHOL", "HDL", "LDLCALC", "TRIG", "CHOLHDL", "LDLDIRECT" in the last 72 hours. Thyroid function studies Recent Labs    11/21/23 0855  TSH 5.770*   Anemia work up No results for input(s): "VITAMINB12", "FOLATE", "FERRITIN", "TIBC", "IRON", "RETICCTPCT" in the last 72 hours. Urinalysis    Component Value Date/Time   COLORURINE YELLOW 09/18/2023 0828   APPEARANCEUR CLEAR 09/18/2023 0828   LABSPEC >1.046 (H) 09/18/2023 0828   PHURINE 6.0  09/18/2023 0828   GLUCOSEU NEGATIVE 09/18/2023 0828   HGBUR NEGATIVE 09/18/2023 0828   BILIRUBINUR NEGATIVE 09/18/2023 0828   KETONESUR NEGATIVE 09/18/2023 0828   PROTEINUR NEGATIVE 09/18/2023 0828   UROBILINOGEN 0.2 06/04/2010 0845   NITRITE NEGATIVE 09/18/2023 0828   LEUKOCYTESUR NEGATIVE 09/18/2023 0828   Sepsis Labs Recent Labs  Lab 11/21/23 0855 11/22/23 0359  WBC 8.5 9.5   Microbiology No results found for this or any previous visit (from the past 240 hours).   Time coordinating discharge: Over 35 minutes  SIGNED:   Macdonald Savoy, MD  [facility] 11/23/2023, 8:19 AM Pager   If 7PM-7AM, please contact night-coverage [email or web address] Password  [email or web address]

## 2023-11-23 NOTE — Progress Notes (Signed)
 Discharge instructions (including medications) discussed with and copy provided to patient/caregiver

## 2023-11-23 NOTE — TOC Transition Note (Signed)
 Transition of Care [facility]) - Discharge Note Sherin Dingwall RN, BSN Transitions of Care Unit 4E- RN Case Manager See Treatment Team for direct phone #   Patient Details  Name: Brenda Stephens MRN: [id number] Date of Birth: 1941-02-19  Transition of Care [facility]) CM/SW Contact:  Rox Cope, RN Phone Number: 11/23/2023, 11:38 AM   Clinical Narrative:    Pt stable for transition home today, orders placed for  HHPTOT  CM spoke with pt at bedside. List provided for [facility] choice Per CMS guidelines from [email or web address] website with star ratings (copy placed in shadow chart)- pt voiced she has had HH in past- however request that this writer reach out to daughter as she can not remember name of agency.  Daughter to come provide transportation home.   Address, phone # and PCP all confirmed with pt.   TC made to daughter Monta Anton as per pt request, discussed HH and list reviewed over phone for [facility] choice- per Jodi Bayada is agency they have used in past and would like to use again. Pt has DME.   Call made to [facility]- liaison- Randel Buss has accepted referral.   No further TOC needs noted.    Final next level of care: Home w Home Health Services Barriers to Discharge: No Barriers Identified   Patient Goals and CMS Choice Patient states their goals for this hospitalization and ongoing recovery are:: return home CMS Medicare.gov Compare Post Acute Care list provided to:: Patient Represenative (must comment) Choice offered to / list presented to : Patient, Adult Children      Discharge Placement               Home w/ [facility]        Discharge Plan and Services Additional resources added to the After Visit Summary for     Discharge Planning Services: CM Consult Post Acute Care Choice: Home Health          DME Arranged: N/A DME Agency: NA       HH Arranged: PT, OT HH Agency: [facility] Date [facility] Agency Contacted: 11/23/23 Time HH Agency Contacted: 1015 Representative  spoke with at [facility] Agency: Randel Buss  Social Drivers of Health (SDOH) Interventions SDOH Screenings   Food Insecurity: No Food Insecurity (07/09/2023)  Housing: Low Risk  (07/09/2023)  Transportation Needs: No Transportation Needs (07/09/2023)  Utilities: Not At Risk (07/09/2023)  Financial Resource Strain: Low Risk  (03/12/2021)   Received from [facility] visits prior to 08/30/2022., [facility] visits prior to 08/30/2022.  Physical Activity: Inactive (03/12/2021)   Received from [facility] visits prior to 08/30/2022., [facility] visits prior to 08/30/2022.  Social Connections: Unknown (07/09/2023)  Stress: Stress Concern Present (03/12/2021)   [facility] from [facility] visits prior to 08/30/2022., [facility] visits prior to 08/30/2022.  Tobacco Use: Medium Risk (11/21/2023)     Readmission Risk Interventions     No data to display

## 2023-11-25 DIAGNOSIS — R072 Precordial pain: Secondary | ICD-10-CM | POA: Insufficient documentation

## 2023-11-25 DIAGNOSIS — I7789 Other specified disorders of arteries and arterioles: Secondary | ICD-10-CM | POA: Insufficient documentation

## 2023-11-25 NOTE — Progress Notes (Unsigned)
 Cardiology Office Note:   Date:  11/26/2023  ID:  Brenda Stephens, DOB Aug 07, 1940, MRN [id number] PCP: Brenda Stephens  [facility] Providers Cardiologist:  Eilleen Grates, MD {  History of Present Illness:   Brenda Stephens is a 83 y.o. female  who I saw in September last year for evaluation of chest pain.  I saw her for palpitations in 2017 and leg swelling in 2019.  She had chest pain and had normal coronaries in 2021.  She has had submassive bilateral pulmonary emboli with right lower extremity DVT.  She was placed on heparin  and then eventually Eliquis .  She was thought to have breast cancer and sedentary lifestyle as an etiology.  Echocardiogram demonstrated well-preserved ejection fraction.    On 02/11/2023 and was diagnosed with new atrial fibrillation with RVR.  She was only partially compliant with her Eliquis .  She was subsequently admitted to the hospital for A-fib with RVR.  Initially she was placed on Cardizem  drip.    She converted on her own back into NSR.    She lives with her daughter.  She gets around slowly with a walker or a wheelchair.  She just felt very weak when she went to the hospital.  She had low blood sugar.  She felt lightheaded.  She does have chest discomfort but has had normal coronaries in 2021.  She does occasionally take nitroglycerin .  I do note that in the hospital with her atrial fibrillation with rapid rate her troponins were normal.  She does not have any acute shortness of breath, PND or orthopnea.  She has not had any new chest pressure, neck or arm discomfort.  She would not really know she was in fibrillation.  Of note she had been off of her beta-blocker because she has not been able to get the correct dose through her pharmacy.  She does take her blood thinner but forgets to take the morning dose sometimes.  ROS: Positive for memory disorder, joint pains. Otherwise as stated in the HPI and negative for all other systems.  Studies Reviewed:     EKG:   EKG Interpretation Date/Time:  Thursday Nov 26 2023 15:06:55 EDT Ventricular Rate:  106 PR Interval:  178 QRS Duration:  72 QT Interval:  334 QTC Calculation: 443 R Axis:   -7  Text Interpretation: Sinus tachycardia Early transition When compared with ECG of 21-Nov-2023 08:52, No significant change since last tracing Confirmed by Eilleen Grates ([id number]) on 11/26/2023 3:17:07 PM     Risk Assessment/Calculations:    CHA2DS2-VASc Score = 4   This indicates a 4.8% annual risk of stroke. The patient's score is based upon: CHF History: 0 HTN History: 1 Diabetes History: 0 Stroke History: 0 Vascular Disease History: 0 Age Score: 2 Gender Score: 1   Physical Exam:   VS:  BP (!) 143/84   Pulse (!) 106   Ht 5\' 3"  (1.6 m)   Wt 183 lb (83 kg)   SpO2 95%   BMI 32.42 kg/m    Wt Readings from Last 3 Encounters:  11/26/23 183 lb (83 kg)  11/23/23 183 lb 3.2 oz (83.1 kg)  09/18/23 182 lb (82.6 kg)     GEN: Well nourished, well developed in no acute distress NECK: No JVD; No carotid bruits CARDIAC: RRR, no murmurs, rubs, gallops RESPIRATORY:  Clear to auscultation without rales, wheezing or rhonchi  ABDOMEN: Soft, non-tender, non-distended EXTREMITIES:  No edema; No deformity   ASSESSMENT AND PLAN:  HTN:    Her blood pressure is mildly elevated.  She has not yet restarted her beta-blocker which is going to start.   PULMONARY EMBOLI: She now has another indication to take her blood thinner.  I told both her and her daughter to set their phones to alarm to the morning dose.  They take other twice daily drugs.  They think they can comply with twice daily dosing rather than switching to Xarelto.   AORTIC ROOT ENLARGEMENT: This was 40 mm.  I will follow this conservatively.   CHEST PAIN: She has had no objective evidence of ischemia.  She had normal coronary arteries a few years ago on cath.  No further cardiac workup.  PAF: She is not having PAF.  She is already on  anticoagulation.  She is going to restart the beta-blocker.  At this point I am not planning meds for rhythm control unless she has recurrent symptomatic fibrillation.  I think she would be a poor candidate for ablation.     Follow up with Hao Meng PAc in about 2 months.   Signed, Eilleen Grates, MD

## 2023-11-26 ENCOUNTER — Ambulatory Visit: Payer: MEDICARE | Attending: Cardiology | Admitting: Cardiology

## 2023-11-26 ENCOUNTER — Other Ambulatory Visit (HOSPITAL_COMMUNITY): Payer: Self-pay

## 2023-11-26 ENCOUNTER — Encounter: Payer: Self-pay | Admitting: Cardiology

## 2023-11-26 VITALS — BP 143/84 | HR 106 | Ht 63.0 in | Wt 183.0 lb

## 2023-11-26 DIAGNOSIS — I480 Paroxysmal atrial fibrillation: Secondary | ICD-10-CM | POA: Diagnosis present

## 2023-11-26 DIAGNOSIS — I10 Essential (primary) hypertension: Secondary | ICD-10-CM

## 2023-11-26 DIAGNOSIS — R072 Precordial pain: Secondary | ICD-10-CM

## 2023-11-26 DIAGNOSIS — R002 Palpitations: Secondary | ICD-10-CM

## 2023-11-26 DIAGNOSIS — M7989 Other specified soft tissue disorders: Secondary | ICD-10-CM | POA: Diagnosis not present

## 2023-11-26 DIAGNOSIS — I7789 Other specified disorders of arteries and arterioles: Secondary | ICD-10-CM

## 2023-11-26 MED ORDER — FUROSEMIDE 20 MG PO TABS
20.0000 mg | ORAL_TABLET | Freq: Every day | ORAL | 3 refills | Status: DC
  Filled 2023-12-03 – 2023-12-17 (×2): qty 90, 90d supply, fill #0
  Filled 2024-03-21 – 2024-04-10 (×2): qty 90, 90d supply, fill #1
  Filled ????-??-??: fill #1

## 2023-11-26 MED ORDER — MEMANTINE HCL 10 MG PO TABS
10.0000 mg | ORAL_TABLET | Freq: Two times a day (BID) | ORAL | 3 refills | Status: AC
  Filled 2024-02-22: qty 180, 90d supply, fill #0
  Filled 2024-04-10: qty 180, 90d supply, fill #1

## 2023-11-26 MED ORDER — APIXABAN 5 MG PO TABS
5.0000 mg | ORAL_TABLET | Freq: Two times a day (BID) | ORAL | 3 refills | Status: DC
  Filled 2023-12-17: qty 60, 30d supply, fill #0

## 2023-11-26 MED ORDER — APIXABAN 5 MG PO TABS
5.0000 mg | ORAL_TABLET | Freq: Two times a day (BID) | ORAL | 5 refills | Status: AC
  Filled 2023-11-26 – 2023-12-17 (×2): qty 60, 30d supply, fill #0
  Filled 2024-02-22: qty 60, 30d supply, fill #1
  Filled 2024-03-23 – 2024-04-10 (×2): qty 60, 30d supply, fill #2

## 2023-11-26 MED ORDER — QUETIAPINE FUMARATE 50 MG PO TABS: 50.0000 mg | ORAL_TABLET | Freq: Every evening | ORAL | 1 refills | Status: DC

## 2023-11-26 MED ORDER — HYDROXYZINE HCL 25 MG PO TABS
25.0000 mg | ORAL_TABLET | Freq: Four times a day (QID) | ORAL | 1 refills | Status: AC | PRN
  Filled 2024-02-22: qty 90, 23d supply, fill #0

## 2023-11-26 MED ORDER — DULOXETINE HCL 60 MG PO CPEP
120.0000 mg | ORAL_CAPSULE | Freq: Every day | ORAL | 1 refills | Status: AC
  Filled 2023-12-03 – 2023-12-17 (×2): qty 180, 90d supply, fill #0
  Filled 2024-03-23 – 2024-04-10 (×2): qty 180, 90d supply, fill #1
  Filled ????-??-??: fill #1

## 2023-11-26 MED ORDER — VALSARTAN 80 MG PO TABS
80.0000 mg | ORAL_TABLET | Freq: Every day | ORAL | 2 refills | Status: DC
  Filled 2023-12-03 – 2023-12-17 (×2): qty 90, 90d supply, fill #0

## 2023-11-26 MED ORDER — METOPROLOL SUCCINATE ER 50 MG PO TB24
50.0000 mg | ORAL_TABLET | Freq: Every day | ORAL | 3 refills | Status: DC
  Filled 2023-11-26: qty 90, 90d supply, fill #0
  Filled 2023-12-03 – 2023-12-17 (×2): qty 90, 90d supply, fill #1

## 2023-11-26 NOTE — Patient Instructions (Signed)
 Medication Instructions:    Your medication will be switch to [facility] - the pharmacy will handle everything *If you need a refill on your cardiac medications before your next appointment, please call your pharmacy*   Lab Work: Not needed    Testing/Procedures: Not needed   Follow-Up: At [facility], you and your health needs are our priority.  As part of our continuing mission to provide you with exceptional heart care, we have created designated Provider Care Teams.  These Care Teams include your primary Cardiologist (physician) and Advanced Practice Providers (APPs -  Physician Assistants and Nurse Practitioners) who all work together to provide you with the care you need, when you need it.     Your next appointment:   3 month(s)  The format for your next appointment:   In Person  Provider:   Ervin Heath, PA-C

## 2023-11-27 ENCOUNTER — Other Ambulatory Visit: Payer: Self-pay | Admitting: Internal Medicine

## 2023-12-03 ENCOUNTER — Other Ambulatory Visit: Payer: Self-pay

## 2023-12-03 ENCOUNTER — Other Ambulatory Visit (HOSPITAL_COMMUNITY): Payer: Self-pay

## 2023-12-04 ENCOUNTER — Encounter: Payer: Self-pay | Admitting: Pharmacist

## 2023-12-04 ENCOUNTER — Other Ambulatory Visit: Payer: Self-pay

## 2023-12-04 ENCOUNTER — Telehealth: Payer: Self-pay | Admitting: Cardiology

## 2023-12-04 NOTE — Telephone Encounter (Signed)
 Pt reporting chest pressure the last couple of weeks, she forgot to mention this at her appt w/ Dr. Lavonne Prairie last week.  The "chest pressure is heavy, 8 out of 10", with a "little bit of arm pain when the chest pressure is heavy".   Occurs with activity only. Forwarding to MD for review/advisement.  Pt advised of when it is time to go to ER for evaluation.

## 2023-12-04 NOTE — Telephone Encounter (Signed)
 BP Seated : 150/90  BP Stood up: 130/70 Symptoms while standing up, wondering what intervention can be setup to lower bp. Last couple of weeks have been pressure in her chest, that last for several mins. Daughter states she got a call saying don't worry about it. Please advise

## 2023-12-07 NOTE — Telephone Encounter (Signed)
 Spoke with pt regarding suggestions from Dr. Lavonne Prairie. Pt aware to go to the ED if she has any more chest pain. Pt verbalized understanding. All questions if any were answered.

## 2023-12-09 ENCOUNTER — Other Ambulatory Visit: Payer: Self-pay

## 2023-12-15 ENCOUNTER — Telehealth: Payer: Self-pay | Admitting: Cardiology

## 2023-12-15 NOTE — Telephone Encounter (Signed)
 I tried to return call to April, says call not be completed at this time x4  Left voicemail to return call to office for patient

## 2023-12-15 NOTE — Telephone Encounter (Signed)
 Pt c/o medication issue:  1. Name of Medication:   apixaban  (ELIQUIS ) 5 MG TABS tablet    2. How are you currently taking this medication (dosage and times per day)? no  3. Are you having a reaction (difficulty breathing--STAT)? no  4. What is your medication issue? Calling to verify if the medication suppose to be at 2.5mg . The Patient's daughter said the PCP change the dosage but its not on after visit. Please advise

## 2023-12-18 ENCOUNTER — Other Ambulatory Visit (HOSPITAL_COMMUNITY): Payer: Self-pay

## 2023-12-18 ENCOUNTER — Other Ambulatory Visit: Payer: Self-pay

## 2023-12-20 ENCOUNTER — Other Ambulatory Visit: Payer: Self-pay | Admitting: Internal Medicine

## 2023-12-21 NOTE — Telephone Encounter (Signed)
 Attempted to call April at [facility] 2x. Unable to leave message. Phone states unable to complete call at this time. Will attempt to call at a later date.

## 2023-12-23 NOTE — Progress Notes (Deleted)
 Subjective:    Patient ID: Brenda Stephens, female    DOB: [birth year], 83 y.o.   MRN: [id number]  HPI F former smoker followed for allergic rhinitis, Bronchitis, complicated by HBP, GERD, Anemia, R breast cancer Walk Test 10/12/19- desat to 89% with max HR 95. Mostly sats around 94-96. 03/28/21- 306 meters, average pace, stopping x3 for SOB, lowest O2 sat 95%, max HR 103. Cardiac cath 08/02/19- normal PFT10/26/22- she was again unable to follow directions- when to inhale and when to blow out- tech unable to complete test.  Hgb 12.7 9/13 [facility] ========================================================================   08/25/22- 82 yoF former smoker (3 pk yrs) followed for allergic Rhinitis, acute Bronchitis,  Lung Nodule, complicated by HBP, Gr1DD,  GERD, Anemia, R breast cancer, Alzheimers, DVT/PE/Eliquis ,  -albuterol  hfa, Flonase ,  Covid vax-5 Phizer Flu vax-had Hosp 1/16-1/18- after RDVT/ bilat PE.  Follow-up manage by her PCP. New paresthesias left leg.  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 Albuterol  inhaler helps, used occasionally as needed.  No significant wheeze most days. CTaPE 06/30/22- 1. Positive for acute PE with CT evidence of right heart strain (RV/LV Ratio = 1.31 ) consistent with at least submassive (intermediate risk) PE. The presence of right heart strain has been associated with an increased risk of morbidity and mortality. Please refer to the PE Focused order set in EPIC. 2. Aortic and coronary artery atherosclerosis. 3. Trace pleural effusions. 4. Hepatic steatosis. 5. Results phoned to Dr. Trine in the ED at 3:03 a.m., 06/30/2022, with verbal acknowledgement of the key findings. Aortic Atherosclerosis (ICD10-I70.0).  12/24/23-  83 yoF former smoker (3 pk yrs) followed for allergic Rhinitis, acute Bronchitis,  Lung Nodule, complicated by HBP, Gr1DD,  GERD, Anemia, R breast cancer, Alzheimers, DVT/PE/Eliquis ,  -albuterol  hfa, Flonase ,  Hosp  in May with dehydration from vomiting, hyperglycemia  CT chest PE 11/21/23-  IMPRESSION: 1. No significant pulmonary embolism identified. Within the posterior basilar right lower lobe, tiny nonocclusive subsegmental pulmonary embolism cannot be entirely excluded in a small branch but this appearance may be secondary to irregular opacification and respiratory motion. 2. Calcified coronary artery plaque. 3. Aortic atherosclerosis.   Aortic Atherosclerosis (ICD10-I70.0).  ROS-see HPI   + = positive Constitutional:   No-   weight loss, night sweats, fevers, chills, fatigue, lassitude. HEENT:   + headaches, difficulty swallowing, tooth/dental problems, sore throat,       No-  sneezing, itching, ear ache, nasal congestion, +post nasal drip,  CV:  No-   chest pain, orthopnea, PND, swelling in lower extremities, anasarca,  dizziness, palpitations Resp: + shortness of breath with exertion or at rest.               +productive cough,  No non-productive cough,  No- coughing up of blood.   +   change in color of mucus.  No- wheezing.   Skin: No-   rash or lesions. GI:  No-   heartburn, indigestion, abdominal pain, nausea, vomiting, GU:  MS:  No-   joint pain or swelling.  Neuro-     nothing unusual Psych:  No- change in mood or affect. No depression or anxiety.  No memory loss.    Objective:  OBJ- Physical Exam General- Alert, Oriented, Affect-appropriate, Distress- none acute, + overweight Skin- rash-none, lesions- none, excoriation- none Lymphadenopathy- none Head- atraumatic            Eyes- Gross vision intact, PERRLA, conjunctivae and secretions clear  Ears- Hearing ok,             Nose-, no-Septal dev,  polyps, erosion, perforation             Throat- Mallampati III , mucosa clear , drainage- none, tonsils- atrophic Neck- flexible , trachea midline, no stridor , thyroid  nl, carotid no bruit Chest - symmetrical excursion , unlabored           Heart/CV- RRR , no murmur , no  gallop  , no rub, nl s1 s2                           - JVD- none , edema-none, stasis changes- none, varices- none           Lung- + fine rales, unlabored, wheeze- none, cough none, dullness-none, rub- none           Chest wall-  Abd- Br/ Gen/ Rectal- Not done, not indicated Extrem- cyanosis- none, clubbing, none, atrophy- none, strength- nl.  Neuro- +slight head bob Assessment & Plan:

## 2023-12-24 ENCOUNTER — Ambulatory Visit: Payer: MEDICARE | Admitting: Internal Medicine

## 2023-12-29 NOTE — Telephone Encounter (Signed)
 Attempted to call April at [facility] regarding pt 7/1. Unable to leave message. Will call again at a later date.

## 2024-01-12 NOTE — Telephone Encounter (Signed)
 Attempted to call April at [facility]. Unable to leave message. 3rd attempt.

## 2024-01-12 NOTE — Telephone Encounter (Signed)
 Left voice message with Daughter (per DPR) to call back 7/15

## 2024-01-14 ENCOUNTER — Other Ambulatory Visit: Payer: Self-pay

## 2024-01-14 ENCOUNTER — Emergency Department (HOSPITAL_COMMUNITY)
Admission: EM | Admit: 2024-01-14 | Discharge: 2024-01-14 | Discharge status: Against Medical Advice | Payer: MEDICARE | Attending: Student | Admitting: Student

## 2024-01-14 DIAGNOSIS — R197 Diarrhea, unspecified: Secondary | ICD-10-CM | POA: Insufficient documentation

## 2024-01-14 DIAGNOSIS — Z7901 Long term (current) use of anticoagulants: Secondary | ICD-10-CM | POA: Insufficient documentation

## 2024-01-14 DIAGNOSIS — K649 Unspecified hemorrhoids: Secondary | ICD-10-CM | POA: Diagnosis not present

## 2024-01-14 DIAGNOSIS — Z5321 Procedure and treatment not carried out due to patient leaving prior to being seen by health care provider: Secondary | ICD-10-CM | POA: Diagnosis not present

## 2024-01-14 DIAGNOSIS — K625 Hemorrhage of anus and rectum: Secondary | ICD-10-CM | POA: Diagnosis present

## 2024-01-14 LAB — COMPREHENSIVE METABOLIC PANEL WITH GFR
ALT: 17 U/L (ref 0–44)
AST: 22 U/L (ref 15–41)
Albumin: 3.4 g/dL — ABNORMAL LOW (ref 3.5–5.0)
Alkaline Phosphatase: 58 U/L (ref 38–126)
Anion gap: 12 (ref 5–15)
BUN: 12 mg/dL (ref 8–23)
CO2: 21 mmol/L — ABNORMAL LOW (ref 22–32)
Calcium: 9.4 mg/dL (ref 8.9–10.3)
Chloride: 103 mmol/L (ref 98–111)
Creatinine, Ser: 0.97 mg/dL (ref 0.44–1.00)
GFR, Estimated: 58 mL/min — ABNORMAL LOW (ref >60)
Glucose, Bld: 124 mg/dL — ABNORMAL HIGH (ref 70–99)
Potassium: 3.6 mmol/L (ref 3.5–5.1)
Sodium: 136 mmol/L (ref 135–145)
Total Bilirubin: 0.4 mg/dL (ref 0.0–1.2)
Total Protein: 7.1 g/dL (ref 6.5–8.1)

## 2024-01-14 LAB — TYPE AND SCREEN
ABO/RH(D): A POS
Antibody Screen: NEGATIVE

## 2024-01-14 LAB — CBC
HCT: 31.2 % — ABNORMAL LOW (ref 36.0–46.0)
Hemoglobin: 9.7 g/dL — ABNORMAL LOW (ref 12.0–15.0)
MCH: 27.2 pg (ref 26.0–34.0)
MCHC: 31.1 g/dL (ref 30.0–36.0)
MCV: 87.6 fL (ref 80.0–100.0)
Platelets: 338 K/uL (ref 150–400)
RBC: 3.56 MIL/uL — ABNORMAL LOW (ref 3.87–5.11)
RDW: 13.2 % (ref 11.5–15.5)
WBC: 7.2 K/uL (ref 4.0–10.5)
nRBC: 0 % (ref 0.0–0.2)

## 2024-01-14 NOTE — ED Notes (Signed)
 Pt decided to leave AMA with daughter stated her dr told her to come straight to them

## 2024-01-14 NOTE — ED Notes (Signed)
 Daughter got an appt with her GI doc so she left and took her there.

## 2024-01-14 NOTE — ED Notes (Signed)
 Patient complaining that now her afib is acting up and feels like her heart is jumping and skipping.  NT taking patient to get an EKG in triage

## 2024-01-14 NOTE — ED Triage Notes (Signed)
 Patient from home via EMS-having ongoing issues with bleeding hemorrhoids. On Eliquis  due to afib. L sided chest pain x 3 weeks, seeing PCP for same without remarkable findings. Also three days of diarrhea, which she states is not that abnormal for her. 152/78 lying-140/70 sitting orthostatic vitals.

## 2024-01-14 NOTE — ED Provider Notes (Signed)
 Emergency Medicine Provider Triage Evaluation Note  Brenda Stephens , a 83 y.o. female  was evaluated in triage.  Pt complains of rectal bleeding.  Patient states that she has been bleeding over the last 3 weeks but is started to become more fatigued.  Patient is on Eliquis .  Known history of hemorrhoids.  Review of Systems  Positive: GI bleeding Negative: Chest pain, shortness of breath, abdominal pain, vomiting  Physical Exam  BP (!) 175/77 (BP Location: Left Arm)   Pulse 75   Temp 97.6 F (36.4 C)   Resp 18   SpO2 99%  Gen:   Awake, no distress   Resp:  Normal effort  MSK:   Moves extremities without difficulty  Other:    Medical Decision Making  Medically screening exam initiated at 9:56 AM.  Appropriate orders placed.  Brenda Stephens was informed that the remainder of the evaluation will be completed by another provider, this initial triage assessment does not replace that evaluation, and the importance of remaining in the ED until their evaluation is complete.     Brenda Dixon, MD 01/14/24 1247

## 2024-01-22 ENCOUNTER — Encounter: Payer: Self-pay | Admitting: Cardiology

## 2024-01-24 ENCOUNTER — Inpatient Hospital Stay (HOSPITAL_COMMUNITY)
Admission: EM | Admit: 2024-01-24 | Discharge: 2024-01-29 | DRG: 394 | Disposition: A | Discharge status: Home Health | Payer: MEDICARE | Attending: Infectious Diseases | Admitting: Infectious Diseases

## 2024-01-24 ENCOUNTER — Observation Stay (HOSPITAL_COMMUNITY): Payer: MEDICARE

## 2024-01-24 ENCOUNTER — Emergency Department (HOSPITAL_COMMUNITY): Payer: MEDICARE

## 2024-01-24 ENCOUNTER — Encounter (HOSPITAL_COMMUNITY): Payer: Self-pay

## 2024-01-24 ENCOUNTER — Other Ambulatory Visit: Payer: Self-pay

## 2024-01-24 DIAGNOSIS — K644 Residual hemorrhoidal skin tags: Secondary | ICD-10-CM | POA: Diagnosis present

## 2024-01-24 DIAGNOSIS — K641 Second degree hemorrhoids: Secondary | ICD-10-CM | POA: Diagnosis not present

## 2024-01-24 DIAGNOSIS — D6869 Other thrombophilia: Secondary | ICD-10-CM | POA: Diagnosis present

## 2024-01-24 DIAGNOSIS — T45515A Adverse effect of anticoagulants, initial encounter: Secondary | ICD-10-CM | POA: Diagnosis present

## 2024-01-24 DIAGNOSIS — Z96619 Presence of unspecified artificial shoulder joint: Secondary | ICD-10-CM | POA: Diagnosis present

## 2024-01-24 DIAGNOSIS — K529 Noninfective gastroenteritis and colitis, unspecified: Secondary | ICD-10-CM | POA: Diagnosis present

## 2024-01-24 DIAGNOSIS — W010XXA Fall on same level from slipping, tripping and stumbling without subsequent striking against object, initial encounter: Secondary | ICD-10-CM | POA: Diagnosis present

## 2024-01-24 DIAGNOSIS — Z91040 Latex allergy status: Secondary | ICD-10-CM

## 2024-01-24 DIAGNOSIS — Y92009 Unspecified place in unspecified non-institutional (private) residence as the place of occurrence of the external cause: Secondary | ICD-10-CM

## 2024-01-24 DIAGNOSIS — G8929 Other chronic pain: Secondary | ICD-10-CM | POA: Diagnosis present

## 2024-01-24 DIAGNOSIS — D124 Benign neoplasm of descending colon: Secondary | ICD-10-CM | POA: Diagnosis present

## 2024-01-24 DIAGNOSIS — I129 Hypertensive chronic kidney disease with stage 1 through stage 4 chronic kidney disease, or unspecified chronic kidney disease: Secondary | ICD-10-CM | POA: Diagnosis present

## 2024-01-24 DIAGNOSIS — F32A Depression, unspecified: Secondary | ICD-10-CM | POA: Diagnosis present

## 2024-01-24 DIAGNOSIS — R339 Retention of urine, unspecified: Secondary | ICD-10-CM | POA: Diagnosis present

## 2024-01-24 DIAGNOSIS — M25571 Pain in right ankle and joints of right foot: Secondary | ICD-10-CM | POA: Diagnosis present

## 2024-01-24 DIAGNOSIS — R42 Dizziness and giddiness: Secondary | ICD-10-CM

## 2024-01-24 DIAGNOSIS — R000 Tachycardia, unspecified: Secondary | ICD-10-CM | POA: Diagnosis present

## 2024-01-24 DIAGNOSIS — I951 Orthostatic hypotension: Secondary | ICD-10-CM | POA: Diagnosis present

## 2024-01-24 DIAGNOSIS — R7989 Other specified abnormal findings of blood chemistry: Secondary | ICD-10-CM | POA: Diagnosis present

## 2024-01-24 DIAGNOSIS — Z7901 Long term (current) use of anticoagulants: Secondary | ICD-10-CM

## 2024-01-24 DIAGNOSIS — Z86711 Personal history of pulmonary embolism: Secondary | ICD-10-CM

## 2024-01-24 DIAGNOSIS — Z9842 Cataract extraction status, left eye: Secondary | ICD-10-CM

## 2024-01-24 DIAGNOSIS — F0393 Unspecified dementia, unspecified severity, with mood disturbance: Secondary | ICD-10-CM | POA: Diagnosis present

## 2024-01-24 DIAGNOSIS — R55 Syncope and collapse: Secondary | ICD-10-CM | POA: Diagnosis present

## 2024-01-24 DIAGNOSIS — Z90722 Acquired absence of ovaries, bilateral: Secondary | ICD-10-CM

## 2024-01-24 DIAGNOSIS — D509 Iron deficiency anemia, unspecified: Secondary | ICD-10-CM | POA: Diagnosis present

## 2024-01-24 DIAGNOSIS — Z9181 History of falling: Secondary | ICD-10-CM

## 2024-01-24 DIAGNOSIS — D6832 Hemorrhagic disorder due to extrinsic circulating anticoagulants: Secondary | ICD-10-CM | POA: Diagnosis present

## 2024-01-24 DIAGNOSIS — S0990XA Unspecified injury of head, initial encounter: Secondary | ICD-10-CM | POA: Diagnosis present

## 2024-01-24 DIAGNOSIS — Z66 Do not resuscitate: Secondary | ICD-10-CM | POA: Diagnosis present

## 2024-01-24 DIAGNOSIS — R1310 Dysphagia, unspecified: Secondary | ICD-10-CM

## 2024-01-24 DIAGNOSIS — Z79890 Hormone replacement therapy (postmenopausal): Secondary | ICD-10-CM

## 2024-01-24 DIAGNOSIS — E8721 Acute metabolic acidosis: Secondary | ICD-10-CM | POA: Diagnosis present

## 2024-01-24 DIAGNOSIS — K649 Unspecified hemorrhoids: Secondary | ICD-10-CM

## 2024-01-24 DIAGNOSIS — F419 Anxiety disorder, unspecified: Secondary | ICD-10-CM | POA: Diagnosis present

## 2024-01-24 DIAGNOSIS — D649 Anemia, unspecified: Secondary | ICD-10-CM

## 2024-01-24 DIAGNOSIS — K222 Esophageal obstruction: Secondary | ICD-10-CM | POA: Diagnosis present

## 2024-01-24 DIAGNOSIS — Z96611 Presence of right artificial shoulder joint: Secondary | ICD-10-CM | POA: Diagnosis present

## 2024-01-24 DIAGNOSIS — N1831 Chronic kidney disease, stage 3a: Secondary | ICD-10-CM | POA: Diagnosis present

## 2024-01-24 DIAGNOSIS — I480 Paroxysmal atrial fibrillation: Secondary | ICD-10-CM | POA: Diagnosis present

## 2024-01-24 DIAGNOSIS — Z886 Allergy status to analgesic agent status: Secondary | ICD-10-CM

## 2024-01-24 DIAGNOSIS — Z853 Personal history of malignant neoplasm of breast: Secondary | ICD-10-CM

## 2024-01-24 DIAGNOSIS — Z882 Allergy status to sulfonamides status: Secondary | ICD-10-CM

## 2024-01-24 DIAGNOSIS — F0390 Unspecified dementia without behavioral disturbance: Secondary | ICD-10-CM | POA: Diagnosis present

## 2024-01-24 DIAGNOSIS — Z9079 Acquired absence of other genital organ(s): Secondary | ICD-10-CM

## 2024-01-24 DIAGNOSIS — Z9049 Acquired absence of other specified parts of digestive tract: Secondary | ICD-10-CM

## 2024-01-24 DIAGNOSIS — Z86718 Personal history of other venous thrombosis and embolism: Secondary | ICD-10-CM

## 2024-01-24 DIAGNOSIS — E039 Hypothyroidism, unspecified: Secondary | ICD-10-CM | POA: Diagnosis present

## 2024-01-24 DIAGNOSIS — N179 Acute kidney failure, unspecified: Secondary | ICD-10-CM | POA: Diagnosis present

## 2024-01-24 DIAGNOSIS — R1314 Dysphagia, pharyngoesophageal phase: Secondary | ICD-10-CM | POA: Diagnosis present

## 2024-01-24 DIAGNOSIS — Z8719 Personal history of other diseases of the digestive system: Secondary | ICD-10-CM

## 2024-01-24 DIAGNOSIS — D500 Iron deficiency anemia secondary to blood loss (chronic): Secondary | ICD-10-CM | POA: Diagnosis present

## 2024-01-24 DIAGNOSIS — Z8051 Family history of malignant neoplasm of kidney: Secondary | ICD-10-CM

## 2024-01-24 DIAGNOSIS — E785 Hyperlipidemia, unspecified: Secondary | ICD-10-CM | POA: Diagnosis present

## 2024-01-24 DIAGNOSIS — K219 Gastro-esophageal reflux disease without esophagitis: Secondary | ICD-10-CM | POA: Diagnosis present

## 2024-01-24 DIAGNOSIS — Z8249 Family history of ischemic heart disease and other diseases of the circulatory system: Secondary | ICD-10-CM

## 2024-01-24 DIAGNOSIS — F0394 Unspecified dementia, unspecified severity, with anxiety: Secondary | ICD-10-CM | POA: Diagnosis present

## 2024-01-24 DIAGNOSIS — Z87891 Personal history of nicotine dependence: Secondary | ICD-10-CM

## 2024-01-24 DIAGNOSIS — E876 Hypokalemia: Secondary | ICD-10-CM | POA: Diagnosis present

## 2024-01-24 DIAGNOSIS — E869 Volume depletion, unspecified: Secondary | ICD-10-CM | POA: Diagnosis present

## 2024-01-24 DIAGNOSIS — Z79899 Other long term (current) drug therapy: Secondary | ICD-10-CM

## 2024-01-24 DIAGNOSIS — Z9841 Cataract extraction status, right eye: Secondary | ICD-10-CM

## 2024-01-24 DIAGNOSIS — K5900 Constipation, unspecified: Secondary | ICD-10-CM | POA: Diagnosis present

## 2024-01-24 DIAGNOSIS — Z90710 Acquired absence of both cervix and uterus: Secondary | ICD-10-CM

## 2024-01-24 DIAGNOSIS — W19XXXA Unspecified fall, initial encounter: Principal | ICD-10-CM | POA: Insufficient documentation

## 2024-01-24 DIAGNOSIS — K5730 Diverticulosis of large intestine without perforation or abscess without bleeding: Secondary | ICD-10-CM | POA: Diagnosis present

## 2024-01-24 DIAGNOSIS — Z860100 Personal history of colon polyps, unspecified: Secondary | ICD-10-CM

## 2024-01-24 DIAGNOSIS — Z96612 Presence of left artificial shoulder joint: Secondary | ICD-10-CM | POA: Diagnosis present

## 2024-01-24 LAB — LIPASE, BLOOD: Lipase: 27 U/L (ref 11–51)

## 2024-01-24 LAB — I-STAT CHEM 8, ED
BUN: 26 mg/dL — ABNORMAL HIGH (ref 8–23)
Calcium, Ion: 1.25 mmol/L (ref 1.15–1.40)
Chloride: 105 mmol/L (ref 98–111)
Creatinine, Ser: 1.4 mg/dL — ABNORMAL HIGH (ref 0.44–1.00)
Glucose, Bld: 119 mg/dL — ABNORMAL HIGH (ref 70–99)
HCT: 26 % — ABNORMAL LOW (ref 36.0–46.0)
Hemoglobin: 8.8 g/dL — ABNORMAL LOW (ref 12.0–15.0)
Potassium: 3.7 mmol/L (ref 3.5–5.1)
Sodium: 136 mmol/L (ref 135–145)
TCO2: 19 mmol/L — ABNORMAL LOW (ref 22–32)

## 2024-01-24 LAB — URINALYSIS, ROUTINE W REFLEX MICROSCOPIC
Bilirubin Urine: NEGATIVE
Glucose, UA: NEGATIVE mg/dL
Hgb urine dipstick: NEGATIVE
Ketones, ur: NEGATIVE mg/dL
Leukocytes,Ua: NEGATIVE
Nitrite: NEGATIVE
Protein, ur: NEGATIVE mg/dL
Specific Gravity, Urine: 1.038 — ABNORMAL HIGH (ref 1.005–1.030)
pH: 5 (ref 5.0–8.0)

## 2024-01-24 LAB — COMPREHENSIVE METABOLIC PANEL WITH GFR
ALT: 17 U/L (ref 0–44)
AST: 25 U/L (ref 15–41)
Albumin: 3.4 g/dL — ABNORMAL LOW (ref 3.5–5.0)
Alkaline Phosphatase: 53 U/L (ref 38–126)
Anion gap: 13 (ref 5–15)
BUN: 21 mg/dL (ref 8–23)
CO2: 17 mmol/L — ABNORMAL LOW (ref 22–32)
Calcium: 9.3 mg/dL (ref 8.9–10.3)
Chloride: 104 mmol/L (ref 98–111)
Creatinine, Ser: 1.38 mg/dL — ABNORMAL HIGH (ref 0.44–1.00)
GFR, Estimated: 38 mL/min — ABNORMAL LOW (ref >60)
Glucose, Bld: 125 mg/dL — ABNORMAL HIGH (ref 70–99)
Potassium: 3.2 mmol/L — ABNORMAL LOW (ref 3.5–5.1)
Sodium: 134 mmol/L — ABNORMAL LOW (ref 135–145)
Total Bilirubin: 0.6 mg/dL (ref 0.0–1.2)
Total Protein: 6.6 g/dL (ref 6.5–8.1)

## 2024-01-24 LAB — CBC WITH DIFFERENTIAL/PLATELET
Abs Immature Granulocytes: 0.04 K/uL (ref 0.00–0.07)
Basophils Absolute: 0.1 K/uL (ref 0.0–0.1)
Basophils Relative: 1 %
Eosinophils Absolute: 0.1 K/uL (ref 0.0–0.5)
Eosinophils Relative: 1 %
HCT: 24.5 % — ABNORMAL LOW (ref 36.0–46.0)
Hemoglobin: 7.9 g/dL — ABNORMAL LOW (ref 12.0–15.0)
Immature Granulocytes: 1 %
Lymphocytes Relative: 14 %
Lymphs Abs: 1.1 K/uL (ref 0.7–4.0)
MCH: 27.1 pg (ref 26.0–34.0)
MCHC: 32.2 g/dL (ref 30.0–36.0)
MCV: 83.9 fL (ref 80.0–100.0)
Monocytes Absolute: 0.9 K/uL (ref 0.1–1.0)
Monocytes Relative: 12 %
Neutro Abs: 5.6 K/uL (ref 1.7–7.7)
Neutrophils Relative %: 71 %
Platelets: 374 K/uL (ref 150–400)
RBC: 2.92 MIL/uL — ABNORMAL LOW (ref 3.87–5.11)
RDW: 13.3 % (ref 11.5–15.5)
WBC: 7.9 K/uL (ref 4.0–10.5)
nRBC: 0 % (ref 0.0–0.2)

## 2024-01-24 LAB — RETICULOCYTES
Immature Retic Fract: 25.3 % — ABNORMAL HIGH (ref 2.3–15.9)
RBC.: 2.67 MIL/uL — ABNORMAL LOW (ref 3.87–5.11)
Retic Count, Absolute: 49.4 K/uL (ref 19.0–186.0)
Retic Ct Pct: 1.9 % (ref 0.4–3.1)

## 2024-01-24 LAB — IRON AND TIBC
Iron: 10 ug/dL — ABNORMAL LOW (ref 28–170)
Saturation Ratios: 2 % — ABNORMAL LOW (ref 10.4–31.8)
TIBC: 433 ug/dL (ref 250–450)
UIBC: 423 ug/dL

## 2024-01-24 LAB — FERRITIN: Ferritin: 6 ng/mL — ABNORMAL LOW (ref 11–307)

## 2024-01-24 MED ORDER — DONEPEZIL HCL 23 MG PO TABS
23.0000 mg | ORAL_TABLET | Freq: Every day | ORAL | Status: DC
  Administered 2024-01-24 – 2024-01-29 (×6): 23 mg via ORAL
  Filled 2024-01-24 (×6): qty 1

## 2024-01-24 MED ORDER — ACETAMINOPHEN 500 MG PO TABS
1000.0000 mg | ORAL_TABLET | Freq: Four times a day (QID) | ORAL | Status: DC
  Administered 2024-01-24 – 2024-01-29 (×17): 1000 mg via ORAL
  Filled 2024-01-24 (×21): qty 2

## 2024-01-24 MED ORDER — SODIUM CHLORIDE 0.9 % IV SOLN: INTRAVENOUS | Status: AC

## 2024-01-24 MED ORDER — LEVOTHYROXINE SODIUM 25 MCG PO TABS
12.5000 ug | ORAL_TABLET | Freq: Every day | ORAL | Status: DC
  Administered 2024-01-25 – 2024-01-29 (×5): 12.5 ug via ORAL
  Filled 2024-01-24 (×5): qty 1

## 2024-01-24 MED ORDER — SODIUM CHLORIDE 0.9 % IV SOLN
500.0000 mg | INTRAVENOUS | Status: DC
  Administered 2024-01-24: 500 mg via INTRAVENOUS
  Filled 2024-01-24 (×2): qty 25

## 2024-01-24 MED ORDER — DULOXETINE HCL 60 MG PO CPEP
60.0000 mg | ORAL_CAPSULE | Freq: Every day | ORAL | Status: DC
  Administered 2024-01-24 – 2024-01-29 (×6): 60 mg via ORAL
  Filled 2024-01-24 (×6): qty 1

## 2024-01-24 MED ORDER — SENNOSIDES-DOCUSATE SODIUM 8.6-50 MG PO TABS
2.0000 | ORAL_TABLET | Freq: Every day | ORAL | Status: DC
  Administered 2024-01-24 – 2024-01-28 (×4): 2 via ORAL
  Filled 2024-01-24 (×5): qty 2

## 2024-01-24 MED ORDER — PANTOPRAZOLE SODIUM 40 MG PO TBEC
40.0000 mg | DELAYED_RELEASE_TABLET | Freq: Every day | ORAL | Status: DC
  Administered 2024-01-25 – 2024-01-29 (×5): 40 mg via ORAL
  Filled 2024-01-24 (×5): qty 1

## 2024-01-24 MED ORDER — PANTOPRAZOLE SODIUM 40 MG IV SOLR
40.0000 mg | Freq: Once | INTRAVENOUS | Status: AC
  Administered 2024-01-24: 40 mg via INTRAVENOUS
  Filled 2024-01-24: qty 10

## 2024-01-24 MED ORDER — NA SULFATE-K SULFATE-MG SULF 17.5-3.13-1.6 GM/177ML PO SOLN
0.5000 | Freq: Once | ORAL | Status: AC
  Administered 2024-01-24: 177 mL via ORAL
  Filled 2024-01-24: qty 1

## 2024-01-24 MED ORDER — IRON SUCROSE 500 MG IVPB - SIMPLE MED
500.0000 mg | INTRAVENOUS | Status: DC
  Filled 2024-01-24: qty 275

## 2024-01-24 MED ORDER — QUETIAPINE FUMARATE 50 MG PO TABS
50.0000 mg | ORAL_TABLET | Freq: Every day | ORAL | Status: DC
  Administered 2024-01-24 – 2024-01-28 (×5): 50 mg via ORAL
  Filled 2024-01-24 (×5): qty 1

## 2024-01-24 MED ORDER — HYDROMORPHONE HCL 2 MG PO TABS
1.0000 mg | ORAL_TABLET | ORAL | Status: DC | PRN
  Administered 2024-01-24: 1 mg via ORAL
  Filled 2024-01-24: qty 1

## 2024-01-24 MED ORDER — POTASSIUM CHLORIDE CRYS ER 20 MEQ PO TBCR
40.0000 meq | EXTENDED_RELEASE_TABLET | Freq: Once | ORAL | Status: AC
  Administered 2024-01-24: 40 meq via ORAL
  Filled 2024-01-24: qty 2

## 2024-01-24 MED ORDER — SODIUM CHLORIDE 0.9 % IV BOLUS
1000.0000 mL | Freq: Once | INTRAVENOUS | Status: AC
  Administered 2024-01-24: 1000 mL via INTRAVENOUS

## 2024-01-24 MED ORDER — MEMANTINE HCL 10 MG PO TABS
10.0000 mg | ORAL_TABLET | Freq: Two times a day (BID) | ORAL | Status: DC
  Administered 2024-01-24 – 2024-01-29 (×10): 10 mg via ORAL
  Filled 2024-01-24 (×14): qty 1

## 2024-01-24 MED ORDER — NA SULFATE-K SULFATE-MG SULF 17.5-3.13-1.6 GM/177ML PO SOLN
0.5000 | Freq: Once | ORAL | Status: AC
  Administered 2024-01-25: 177 mL via ORAL

## 2024-01-24 MED ORDER — TRAMADOL HCL 50 MG PO TABS
50.0000 mg | ORAL_TABLET | Freq: Two times a day (BID) | ORAL | Status: DC | PRN
  Administered 2024-01-28 – 2024-01-29 (×2): 50 mg via ORAL
  Filled 2024-01-24 (×2): qty 1

## 2024-01-24 MED ORDER — IOHEXOL 350 MG/ML SOLN
75.0000 mL | Freq: Once | INTRAVENOUS | Status: AC | PRN
  Administered 2024-01-24: 75 mL via INTRAVENOUS

## 2024-01-24 NOTE — ED Notes (Signed)
 Pt given water  and graham crackers and peanut butter.

## 2024-01-24 NOTE — H&P (Cosign Needed Addendum)
 Date: 01/24/2024               Patient Name:  Brenda Stephens MRN: [id number]  DOB: 1941-03-05 Age / Sex: 83 y.o., female   PCP: Loris Elsie PARAS, PA-C         Medical Service: Internal Medicine Teaching Service         Attending Physician: Dr. Ronnald Sergeant      First Contact: Schuyler Novak, DO    Second Contact: Dr. Hadassah Kristy Ahr, MD          Pager Information: First Contact Pager: [phone]   Second Contact Pager: [phone]   SUBJECTIVE   Chief Complaint: Ground Level Fall  History of Present Illness: Brenda Stephens is a 83 y.o. female with PMH of Hypothyroidism, afib, GERD, dementia, diverticulous, rectal bleeding, and DVT presented today after experiencing a fall at home. She states that she went to use the bathroom and attempted to turn around with walker, lost her balance and fell to the ground. She does admit to hitting her head, shoulder, and her upper back. She denies loss of consciousness, lightheadedness, and vertigo prior to falling. She does have a history of frequent falls, most recently seen in the ED on 7/17 without admission. Upon further evaluation, the patient describes feeling lightheaded frequently when she stands up, but denies ever losing consciousness. She reports that when she falls, she feels weak and like she cannot stand up anymore. She also reports that she has been experiencing rectal bleeding for awhile now. She does follow up with GI outpatient who have contributed this to hemorrhoids not requiring intervention. Discussed admission to the hospital for further work up and the patient is agreeable.   ED Course: Labs significant for BUN 26, Cr 1.4, Serum bicarb 17, Hgb 8.8 Imaging CT Head, CT Cervical spine, CT Chest all without acute abnormality Received 1L NS Consulted GI, IMTS  Meds:  Eliquis  5 mg BID Pantoprazole  40 mg  Calcitonin  Donepezil  23 mg daily Memantine  10 mg daily Cymbalta  60 mg nightly Furosemide  20 mg daily Metoprolol  succinate 50  mg daily Valsartan  80 mg daily Kclor 20 mEq daily Hydroxizine 25 mg PRN for anxiety Quetiapine  50 mg nightly Levothyroxine  25mcg daily Tramadol  50 mg BID Tamsulosin  0.4 mg  Current Meds  Medication Sig   acetaminophen  (TYLENOL ) 650 MG CR tablet Take 1 tablet (650 mg total) by mouth every 8 (eight) hours as needed for pain (headaches). (Patient taking differently: Take 1,300 mg by mouth 2 (two) times daily.)   albuterol  (VENTOLIN  HFA) 108 (90 Base) MCG/ACT inhaler TAKE 2 PUFFS BY MOUTH EVERY 6 HOURS AS NEEDED FOR WHEEZE OR SHORTNESS OF BREATH   apixaban  (ELIQUIS ) 5 MG TABS tablet Take 1 tablet (5 mg total) by mouth 2 (two) times daily.   benzonatate  (TESSALON ) 200 MG capsule TAKE 1 CAPSULE (200 MG TOTAL) BY MOUTH 3 (THREE) TIMES DAILY AS NEEDED FOR COUGH.   Biotin  10000 MCG TABS Take 1,000 mcg by mouth daily.   cetirizine (ZYRTEC) 10 MG tablet Take 10 mg by mouth at bedtime.   dicyclomine  (BENTYL ) 20 MG tablet Take 20 mg by mouth every 6 (six) hours as needed for spasms.   docusate sodium  (COLACE) 250 MG capsule Take 250 mg by mouth at bedtime.   donepezil  (ARICEPT ) 23 MG TABS tablet Take 23 mg by mouth daily.   DULoxetine  (CYMBALTA ) 60 MG capsule Take 2 capsules (120 mg total) by mouth daily. (Patient taking differently: Take 60 mg by  mouth daily.)   furosemide  (LASIX ) 20 MG tablet Take 1 tablet (20 mg total) by mouth daily. (Patient taking differently: Take 20 mg by mouth in the morning.)   hydrOXYzine  (ATARAX ) 25 MG tablet Take 1 tablet (25 mg total) by mouth every 6 (six) hours as needed for anxiety. (Patient taking differently: Take 50 mg by mouth at bedtime.)   levothyroxine  (SYNTHROID ) 25 MCG tablet Take 12.5 mcg by mouth daily before breakfast.   memantine  (NAMENDA ) 10 MG tablet Take 1 tablet (10 mg total) by mouth 2 (two) times daily.   methocarbamol (ROBAXIN) 500 MG tablet Take 500 mg by mouth 2 (two) times daily.   metoprolol  succinate (TOPROL  XL) 50 MG 24 hr tablet Take 1 tablet  (50 mg total) by mouth at bedtime.   Multiple Vitamins-Minerals (CENTRUM SILVER) CHEW Chew 1 tablet by mouth daily.   nitroGLYCERIN  (NITROSTAT ) 0.4 MG SL tablet Place 0.4 mg under the tongue as needed.   ondansetron  (ZOFRAN -ODT) 4 MG disintegrating tablet Take 4 mg by mouth every 8 (eight) hours as needed for nausea or vomiting.   oxybutynin  (DITROPAN ) 5 MG tablet Take 5 mg by mouth 2 (two) times daily.   Polyethyl Glycol-Propyl Glycol (SYSTANE OP) Place 1 drop into both eyes 2 (two) times daily as needed (for dryness).   potassium chloride  SA (KLOR-CON  M) 20 MEQ tablet Take 1 tablet (20 mEq total) by mouth daily. (Patient taking differently: Take 20 mEq by mouth at bedtime.)   Probiotic Product (PROBIOTIC PO) Take 1 tablet by mouth daily.   Simethicone (GAS-X EXTRA STRENGTH PO) Take 1 tablet by mouth daily as needed.   tamsulosin  (FLOMAX ) 0.4 MG CAPS capsule Take 0.4 mg by mouth at bedtime.   traMADol  (ULTRAM ) 50 MG tablet Take 50 mg by mouth 2 (two) times daily as needed.   valsartan  (DIOVAN ) 80 MG tablet TAKE 1 TABLET BY MOUTH EVERY DAY (Patient taking differently: Take 40 mg by mouth daily.)   [DISCONTINUED] venlafaxine  XR (EFFEXOR -XR) 150 MG 24 hr capsule TAKE 1 CAPSULE (150 MG TOTAL) BY MOUTH DAILY.    Past Medical History Hypothyroidism Diverticulosis Breast cancer A-fib Rectal Bleeding CKD stage 3a PE DVT GERD Dementia  Past Surgical History Past Surgical History:  Procedure Laterality Date   ABDOMINAL HYSTERECTOMY     BSO for uterine fibroids   ACHILLES TENDON SURGERY Right 10/07/2012   Procedure: RIGHT REPAIR RUPTURE TENDON PRIMARY OPEN/PERCUTANEOUS ;  Surgeon: Toribio JULIANNA Chancy, MD;  Location: [facility];  Service: Orthopedics;  Laterality: Right;   APPENDECTOMY     BREAST LUMPECTOMY WITH RADIOACTIVE SEED AND SENTINEL LYMPH NODE BIOPSY Right 03/16/2017   Procedure: RIGHT BREAST LUMPECTOMY WITH RADIOACTIVE SEED AND RIGHT AXILLARY SENTINEL  NODE BIOPSY ERAS  PATHWAY;  Surgeon: Ebbie Cough, MD;  Location: [facility];  Service: General;  Laterality: Right;  PECTORAL BLOCK   CATARACT EXTRACTION, BILATERAL     CHOLECYSTECTOMY     COLONOSCOPY W/ POLYPECTOMY     Dr Rosalie   EAR CYST EXCISION Right 10/07/2012   Procedure: 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 ;  Surgeon: Toribio JULIANNA Chancy, MD;  Location: [facility];  Service: Orthopedics;  Laterality: Right;   EXPLORATORY LAPAROTOMY     LEFT HEART CATH AND CORONARY ANGIOGRAPHY N/A 08/02/2019   Procedure: LEFT HEART CATH AND CORONARY ANGIOGRAPHY;  Surgeon: Swaziland, Peter M, MD;  Location: [facility];  Service: Cardiovascular;  Laterality: N/A;   REVISION TOTAL SHOULDER TO REVERSE  TOTAL SHOULDER Left 01/12/2019   Procedure: REVISION TOTAL SHOULDER TO REVERSE TOTAL SHOULDER;  Surgeon: Cristy Bonner DASEN, MD;  Location: [facility];  Service: Orthopedics;  Laterality: Left;   SINUS SURGERY WITH INSTATRAK     TOTAL SHOULDER REPLACEMENT     Right shoulder   TOTAL SHOULDER REPLACEMENT  05/2010   Left   TUBAL LIGATION       Social:  Lives With: Occupation: Support: Level of Function: PCP:  Loris Elsie PARAS, PA-C  Substances: -Tobacco: denies -Alcohol : denies -Recreational Drug: denies  Family History:  Family History  Problem Relation Age of Onset   Hiatal hernia Mother    Heart attack Mother 95   Heart disease Father 1       CAD @ autopsy   Cancer Father        renal cancer   Heart attack Brother 77       fatal   Diabetes Neg Hx    Stroke Neg Hx      Allergies: Allergies as of 01/24/2024 - Review Complete 01/24/2024  Allergen Reaction Noted   Latex Rash 03/31/2014   Sulfa  antibiotics Anaphylaxis and Nausea Only 01/22/2017   Aspirin  Other (See Comments) 03/31/2014   Nsaids Other (See Comments) 06/30/2022   Oxycodone -acetaminophen  Nausea Only 03/31/2014    Review of Systems: A complete ROS was negative except as per HPI.   OBJECTIVE:    Physical Exam: Blood pressure (!) 131/57, pulse 67, temperature 98.6 F (37 C), temperature source Oral, resp. rate 17, SpO2 100%.  Constitutional: well-appearing female in no acute distress HENT: normocephalic atraumatic, mucous membranes moist Eyes: pale conjunctiva Cardiovascular: regular rate and rhythm, no m/r/g Pulmonary/Chest: normal work of breathing on room air, lungs clear to auscultation bilaterally Abdominal: soft, non-tender, non-distended Skin: warm and dry Psych: A&Ox4, pleasant mood  Labs: CBC    Component Value Date/Time   WBC 7.9 01/24/2024 0854   RBC 2.92 (L) 01/24/2024 0854   HGB 8.8 (L) 01/24/2024 0916   HGB 13.0 06/11/2021 1533   HGB 12.5 07/26/2019 1548   HGB 13.1 03/13/2017 1252   HCT 26.0 (L) 01/24/2024 0916   HCT 36.9 07/26/2019 1548   HCT 38.3 03/13/2017 1252   PLT 374 01/24/2024 0854   PLT 235 06/11/2021 1533   PLT 234 07/26/2019 1548   MCV 83.9 01/24/2024 0854   MCV 92 07/26/2019 1548   MCV 91.1 03/13/2017 1252   MCH 27.1 01/24/2024 0854   MCHC 32.2 01/24/2024 0854   RDW 13.3 01/24/2024 0854   RDW 12.6 07/26/2019 1548   RDW 12.3 03/13/2017 1252   LYMPHSABS 1.1 01/24/2024 0854   LYMPHSABS 1.4 03/13/2017 1252   MONOABS 0.9 01/24/2024 0854   MONOABS 0.7 03/13/2017 1252   EOSABS 0.1 01/24/2024 0854   EOSABS 0.2 03/13/2017 1252   BASOSABS 0.1 01/24/2024 0854   BASOSABS 0.1 03/13/2017 1252     CMP     Component Value Date/Time   NA 136 01/24/2024 0916   NA 139 04/27/2023 1150   NA 132 (L) 03/13/2017 1252   K 3.7 01/24/2024 0916   K 4.3 03/13/2017 1252   CL 105 01/24/2024 0916   CO2 17 (L) 01/24/2024 0854   CO2 26 03/13/2017 1252   GLUCOSE 119 (H) 01/24/2024 0916   GLUCOSE 107 03/13/2017 1252   BUN 26 (H) 01/24/2024 0916   BUN 17 04/27/2023 1150   BUN 14.8 03/13/2017 1252   CREATININE 1.40 (H) 01/24/2024 0916   CREATININE 1.08 (H) 06/11/2021 1533  CREATININE 0.8 03/13/2017 1252   CALCIUM  9.3 01/24/2024 0854   CALCIUM  10.0  03/13/2017 1252   PROT 6.6 01/24/2024 0854   PROT 7.2 03/13/2017 1252   ALBUMIN 3.4 (L) 01/24/2024 0854   ALBUMIN 4.0 03/13/2017 1252   AST 25 01/24/2024 0854   AST 18 06/11/2021 1533   AST 18 03/13/2017 1252   ALT 17 01/24/2024 0854   ALT 14 06/11/2021 1533   ALT 18 03/13/2017 1252   ALKPHOS 53 01/24/2024 0854   ALKPHOS 71 03/13/2017 1252   BILITOT 0.6 01/24/2024 0854   BILITOT 0.5 06/11/2021 1533   BILITOT 0.54 03/13/2017 1252   GFRNONAA 38 (L) 01/24/2024 0854   GFRNONAA 52 (L) 06/11/2021 1533   GFRAA >60 09/10/2019 1029    Imaging: CT CHEST ABDOMEN PELVIS W CONTRAST Result Date: 01/24/2024 CLINICAL DATA:  Patient fell out of bed this morning and hit the back of her head. Dizziness. Right shoulder pain. EXAM: CT CHEST, ABDOMEN, AND PELVIS WITH CONTRAST TECHNIQUE: Multidetector CT imaging of the chest, abdomen and pelvis was performed following the standard protocol during bolus administration of intravenous contrast. RADIATION DOSE REDUCTION: This exam was performed according to the departmental dose-optimization program which includes automated exposure control, adjustment of the mA and/or kV according to patient size and/or use of iterative reconstruction technique. CONTRAST:  75mL OMNIPAQUE  IOHEXOL  350 MG/ML SOLN COMPARISON:  11/21/2023 FINDINGS: CT CHEST FINDINGS Cardiovascular: Heart size mildly increased. No substantial pericardial effusion. Mild atherosclerotic calcification is noted in the wall of the thoracic aorta. No large central pulmonary embolus. Mediastinum/Nodes: No mediastinal lymphadenopathy. There is no hilar lymphadenopathy. The esophagus has normal imaging features. There is no axillary lymphadenopathy. Portions of the axilla in supraclavicular regions obscured by beam hardening artifact from bilateral shoulder replacement. Lungs/Pleura: 4 mm right lower lobe nodule seen on 92/6. 4 mm left lower lobe nodule visible on 77/6. 2 mm left upper lobe nodule identified on 56/6.  Calcified granuloma right lower lobe on 85/6. Tiny lower lobe pulmonary nodules are stable since prior. No focal airspace consolidation. There is no evidence of pleural effusion. Musculoskeletal: Bilateral shoulder replacement. Chronic fracture nonunion of the sternum. No worrisome lytic or sclerotic osseous abnormality. CT ABDOMEN PELVIS FINDINGS Hepatobiliary:. A tiny hypodensity in the liver parenchyma is too small to characterize but is statistically most likely benign. No followup imaging is recommended. Gallbladder is surgically absent. No intrahepatic or extrahepatic biliary dilation. Pancreas: Diffuse parenchymal atrophy without main duct dilatation. Spleen: No splenomegaly. No suspicious focal mass lesion. Adrenals/Urinary Tract: No adrenal nodule or mass. Tiny well-defined homogeneous low-density lesions in both kidneys are too small to characterize but are statistically most likely benign and probably cysts. No followup imaging is recommended. No evidence for hydroureter. The urinary bladder appears normal for the degree of distention. Stomach/Bowel: Stomach is unremarkable. No gastric wall thickening. No evidence of outlet obstruction. Duodenum is normally positioned as is the ligament of Treitz. No small bowel wall thickening. No small bowel dilatation. The terminal ileum is normal. Nonvisualization of the appendix is consistent with the reported history of appendectomy. No gross colonic mass. No colonic wall thickening. Vascular/Lymphatic: There is moderate atherosclerotic calcification of the abdominal aorta without aneurysm. There is no gastrohepatic or hepatoduodenal ligament lymphadenopathy. No retroperitoneal or mesenteric lymphadenopathy. No pelvic sidewall lymphadenopathy. Reproductive: Hysterectomy.  There is no adnexal mass. Other: No intraperitoneal free fluid. Musculoskeletal: No worrisome lytic or sclerotic osseous abnormality. Stable superior endplate compression deformity at L1 since  11/21/2023 no evidence for thoracolumbar spine  fracture. No evidence for an acute fracture of the bony pelvis. IMPRESSION: 1. No acute findings in the chest, abdomen, or pelvis. No free fluid in the peritoneal cavity. Specifically, no findings to explain the patient's history of dizziness and right shoulder pain. 2. Stable tiny bilateral pulmonary nodules. No follow-up needed if patient is low-risk (and has no known or suspected primary neoplasm). Non-contrast chest CT can be considered in 12 months if patient is high-risk. This recommendation follows the consensus statement: Guidelines for Management of Incidental Pulmonary Nodules Detected on CT Images: From the Fleischner Society 2017; Radiology 2017; 284:228-243. 3. Stable superior endplate compression deformity at L1 since 11/21/2023. 4. Chronic fracture nonunion of the sternum. 5.  Aortic Atherosclerosis (ICD10-I70.0). Electronically Signed   By: Camellia Candle M.D.   On: 01/24/2024 10:03   DG Chest Portable 1 View Result Date: 01/24/2024 CLINICAL DATA:  Fall. EXAM: PORTABLE CHEST 1 VIEW COMPARISON:  Chest radiograph dated 11/21/2023. FINDINGS: No focal consolidation, pleural effusion or pneumothorax. The cardiac silhouette is within normal limits. No acute osseous pathology. Bilateral shoulder arthroplasties. IMPRESSION: No active disease. Electronically Signed   By: Vanetta Chou M.D.   On: 01/24/2024 09:54   DG Pelvis Portable Result Date: 01/24/2024 CLINICAL DATA:  Fall. EXAM: PORTABLE PELVIS 1-2 VIEWS COMPARISON:  Abdomen and pelvis CT 11/21/2023. FINDINGS: There is no evidence of pelvic fracture or diastasis. No pelvic bone lesions are seen. IMPRESSION: Negative. Electronically Signed   By: Camellia Candle M.D.   On: 01/24/2024 09:54   CT Cervical Spine Wo Contrast Result Date: 01/24/2024 EXAM: CT CERVICAL SPINE WITHOUT CONTRAST 01/24/2024 09:32:55 AM TECHNIQUE: CT of the cervical spine was performed without the administration of intravenous  contrast. Multiplanar reformatted images are provided for review. Automated exposure control, iterative reconstruction, and/or weight based adjustment of the mA/kV was utilized to reduce the radiation dose to as low as reasonably achievable. COMPARISON: CT of the cervical spine 09/18/2023. MRI of the cervical spine 10/25/2023. CLINICAL HISTORY: Neck trauma (Age >= 65y). CT Head Wo Contrast; Head trauma, minor (Age >= 65y); CT Cervical Spine Wo Contrast; Neck trauma (Age >= 65y); CT CHEST ABDOMEN PELVIS W CONTRAST; Polytrauma, blunt. FINDINGS: CERVICAL SPINE: BONES AND ALIGNMENT: Straightening of the normal cervical lordosis is similar to the prior study. Degenerative grade 1 anterolisthesis at C4-5, C7-T1 and T2-T3 is stable. DEGENERATIVE CHANGES: Chronic endplate degenerative changes are again noted at C5-6, C6-7, T1-2 and T2-3. Severe foraminal stenosis is again noted bilaterally at C3-4. Moderate left foraminal stenosis is stable at C5-6. SOFT TISSUES: No prevertebral soft tissue swelling. VASCULATURE: Atherosclerosis is present at the carotid bifurcation without definite stenosis. IMPRESSION: 1. No acute abnormality of the cervical spine related to the reported neck trauma. 2. Straightening of the normal cervical lordosis, similar to the prior study. 3. Degenerative grade 1 anterolisthesis at C4-5, C7-T1, and T2-T3, stable. 4. Chronic endplate degenerative changes at C5-6, C6-7, T1-2, and T2-3. 5. Severe foraminal stenosis bilaterally at C3-4 and moderate left foraminal stenosis at C5-6, stable. Electronically signed by: Lonni Necessary MD 01/24/2024 09:41 AM EDT RP Workstation: [id number]   CT Head Wo Contrast Result Date: 01/24/2024 EXAM: CT HEAD WITHOUT CONTRAST 01/24/2024 09:32:55 AM TECHNIQUE: CT of the head was performed without the administration of intravenous contrast. Automated exposure control, iterative reconstruction, and/or weight based adjustment of the mA/kV was utilized to reduce the  radiation dose to as low as reasonably achievable. COMPARISON: CT head without contrast 09/18/2023. CLINICAL HISTORY: Head trauma, minor (Age >= 65y). Polytrauma,  blunt. FINDINGS: BRAIN AND VENTRICLES: No acute hemorrhage. Gray-white differentiation is preserved. No hydrocephalus. No extra-axial collection. No mass effect or midline shift. Moderate periventricular and subcortical white matter hypoattenuation is noted bilaterally without significant interval change. ORBITS: Bilateral lens replacements are noted. The globes and orbits are otherwise within normal limits. SINUSES: No acute abnormality. SOFT TISSUES AND SKULL: No acute soft tissue abnormality. No skull fracture. VASCULATURE: Atherosclerotic calcifications are present in the cavernous carotid arteries bilaterally. No hyperdense vessel is present. IMPRESSION: 1. No acute intracranial abnormality related to the minor head trauma. 2. Moderate periventricular and subcortical white matter hypoattenuation bilaterally without significant interval change. Electronically signed by: Lonni Necessary MD 01/24/2024 09:37 AM EDT RP Workstation: [id number]     EKG: personally reviewed my interpretation is NSR with PAC.   ASSESSMENT & PLAN:   Assessment & Plan by Problem: Principal Problem:   Symptomatic anemia Active Problems:   Hypothyroidism   History of diverticulosis   Postural dizziness with presyncope   Anxiety and depression   Dementia without behavioral disturbance (HCC)   History of pulmonary embolism   History of DVT of lower extremity   PAF (paroxysmal atrial fibrillation) (HCC)   Hypercoagulable state due to paroxysmal atrial fibrillation (HCC)   Anemia due to GI blood loss   Fall   Brenda Stephens is a 5 y.o. person living with a history of Hypothyroidism, afib, GERD, dementia, diverticulous, rectal bleeding, DVT, and PE who presented after a ground level fall at home and admitted for possible GI Bleed on hospital day 0  Iron   Deficiency Anemia GI bleed The patient reports a month long history of bright red rectal bleeding. Follow with [facility] in the outpatient setting who have not needed to procedurally intervene.  -Pt denies melena -[facility] consulted today for further guidance and possible scope -Hgb 7.9 today on admission.  -Iron  studies with Ferritin 6 and Saturation ratio's 2 -Per Ganzoni score, iron  deficit ~1000mg . Will replete with IV Iron  today.  -Protonix  40mg  -Transfuse if Hgb<7  Falls Postural dizziness with presyncope Chronic Pain The patient admits to frequently falling at home, most recently requiring a visit to the ED on 7/17. She endorses feeling weak with her muscles giving out. She also reports getting up and feeling light headed frequently.  -PT currently on many medications that could be contributing. Will attempt to narrow this list while inpatient -Orthostatic vitals  -PT/OT -Pt complaining of significant pain after her fall. Will restart her home Tramadol  which she has been taking chronically. -Tylenol  1000mg  q6 -1mg  Dilaudid  PO q4 PRN  Esophageal strictures Esophageal dysphagia -The pt reports a long history of esophageal dysphagia with the sensation of food becoming stuck in the chest. Worse with solids>liquids. She denies difficulty swallowing or choking frequently.  -This has worsened over the last few months -Will touch base with GI for further guidance.  - EGD scheduled 7/28  Elevated Scr CKD Stage 3a -Scr elevated today from 7/17. BUN 26 with Cr 1.4. Baseline variable, but ~1.2. -Pt volume down on admission, also with chronic blood loss from GI Bleed -s/p 1L NS in ED -AM RFP  Right ankle pain -Upon physical exam, pt jumped with pain when palpating R ankle. Will order Xray to evaluate. No acute bruising or swelling noted  Urinary Retention -Pt on flomax  and oxybutinin at home. Has not voided since arriving to the ED, despite receiving 1L NS.  -Bladder scan now and  then qshift -Would like to avoid foley catheter  if pt unable to spontaneously void. Straight cath if bladder scan >252ml  A-fib Hx of DVT Hx of PE -Pt with hx of afib, on eliquis  with metoprolol  tartrate for rate control.  -Currently in NSR with HR 58. Low threshold to restart metoprolol  if HR begins to be elevated.  -Last dose of eliquis  7/26 -Pt hypercoagulable state with hx of DVT and PE. Risk benefit conversation had with pt and daughter today due to current GI bleeding and anemia.  -Will continue to hold eliquis  for now.   Dementia -Continue home Donepezil , Memantine , and seroquel  at bedtime.   Best practice: Diet: Clears, NPO at midnight VTE: None--Currently held due to active bleeding and colonoscopy and EGD tomorrow IVF: None,None Code: DNR, pt okay to reverse code status for procedure  Disposition planning: Prior to Admission Living Arrangement: Home, living with daughter Anticipated Discharge Location: pending  Dispo: Admit patient to Observation with expected length of stay less than 2 midnights.  Signed: Myrna Bitters, DO Internal Medicine Resident  01/24/2024, 12:49 PM  Please contact IM Residency On-Call Pager at: [phone] or [phone].

## 2024-01-24 NOTE — Progress Notes (Signed)
 Orthopedic Tech Progress Note Patient Details:  Brenda Stephens August 30, 1940 [id number] Level 2 Trauma  Patient ID: Brenda Stephens, female   DOB: [birth year], 83 y.o.   MRN: [id number]  Brenda Stephens 01/24/2024, 10:44 AM

## 2024-01-24 NOTE — ED Notes (Signed)
 Provider at bedside

## 2024-01-24 NOTE — H&P (View-Only) (Signed)
 [facility] Gastroenterology Consult  Referring Provider: ER Primary Care Physician:  Loris Elsie PARAS, PA-C Primary Gastroenterologist: Dr. Rosalie  Reason for Consultation: Rectal bleeding  HPI: Brenda Stephens is a 83 y.o. female presents to the ER after she lost her balance and fell to the ground hitting her head, shoulder and upper back without associated loss of consciousness or vertigo.  Patient was recently seen in the office on 01/14/2024 by NP, Jacqueline Thigpen, for rectal bleeding, thought to be related to external hemorrhoids, hemoglobin in office was 10.4.  She was given glycerin suppositories, hemorrhoidal ointment and may be Anusol  suppositories but none of them have helped.  She complains of bright red blood in undergarment on wiping and sometimes in the toilet bowl, ongoing for the last 4 months.  Patient also states for the last 2 weeks she has difficulty swallowing, more with solids than liquids, nonprogressive and not associated with weight loss.  She lives at home with her daughter.  Previous GI workup: Upper GI series: Proximal cervical web, hiatal hernia, duodenal diverticulum  EGD 03/2019, epigastric pain, abnormal upper GI series: Moderate hiatal hernia, duodenal diverticulum, gastritis  Gastric emptying scan 2016: Delayed gastric emptying  Colonoscopy 08/2012, Dr. Rosalie, generalized abdominal pain, hematochezia: Internal and external hemorrhoids, diffuse melanosis, otherwise unremarkable, normal TI   Past Medical History:  Diagnosis Date   Acute deep vein thrombosis (DVT) of proximal vein of right lower extremity (HCC) 07/02/2022   Acute pulmonary embolism (HCC) 07/15/2022   AKI (acute kidney injury) (HCC) 02/16/2023   Allergy    latex   Anemia    Anxiety    Arthritis    Atrial fibrillation with RVR (HCC) 02/12/2023   Bilateral pulmonary embolism (HCC) 06/30/2022   Cancer (HCC) 01/2017   right breast cancer   Depression    Dyslipidemia    Eustachian tube  dysfunction    GERD (gastroesophageal reflux disease)    Grade I diastolic dysfunction 07/15/2022   Hx of cardiovascular stress test    Myoview  10/13:  apical thinning, EF 72%, no ischemia   Hx of colonic polyps    Dr Rosalie   Hypertension    Hypothyroidism    Other abnormal glucose    Pulmonary embolism (HCC) 06/30/2022    Past Surgical History:  Procedure Laterality Date   ABDOMINAL HYSTERECTOMY     BSO for uterine fibroids   ACHILLES TENDON SURGERY Right 10/07/2012   Procedure: RIGHT REPAIR RUPTURE TENDON PRIMARY OPEN/PERCUTANEOUS ;  Surgeon: Toribio JULIANNA Chancy, MD;  Location: [facility];  Service: Orthopedics;  Laterality: Right;   APPENDECTOMY     BREAST LUMPECTOMY WITH RADIOACTIVE SEED AND SENTINEL LYMPH NODE BIOPSY Right 03/16/2017   Procedure: RIGHT BREAST LUMPECTOMY WITH RADIOACTIVE SEED AND RIGHT AXILLARY SENTINEL  NODE BIOPSY ERAS PATHWAY;  Surgeon: Ebbie Cough, MD;  Location: [facility];  Service: General;  Laterality: Right;  PECTORAL BLOCK   CATARACT EXTRACTION, BILATERAL     CHOLECYSTECTOMY     COLONOSCOPY W/ POLYPECTOMY     Dr Rosalie   EAR CYST EXCISION Right 10/07/2012   Procedure: 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 ;  Surgeon: Toribio JULIANNA Chancy, MD;  Location: [facility];  Service: Orthopedics;  Laterality: Right;   EXPLORATORY LAPAROTOMY     LEFT HEART CATH AND CORONARY ANGIOGRAPHY N/A 08/02/2019   Procedure: LEFT HEART CATH AND CORONARY ANGIOGRAPHY;  Surgeon: [place], Peter M, MD;  Location: [facility] INVASIVE CV LAB;  Service: Cardiovascular;  Laterality:  N/A;   REVISION TOTAL SHOULDER TO REVERSE TOTAL SHOULDER Left 01/12/2019   Procedure: REVISION TOTAL SHOULDER TO REVERSE TOTAL SHOULDER;  Surgeon: Cristy Bonner DASEN, MD;  Location: [facility];  Service: Orthopedics;  Laterality: Left;   SINUS SURGERY WITH INSTATRAK     TOTAL SHOULDER REPLACEMENT     Right shoulder   TOTAL SHOULDER REPLACEMENT  05/2010   Left   TUBAL  LIGATION      Prior to Admission medications   Medication Sig Start Date End Date Taking? Authorizing Provider  acetaminophen  (TYLENOL ) 650 MG CR tablet Take 1 tablet (650 mg total) by mouth every 8 (eight) hours as needed for pain (headaches). Patient taking differently: Take 1,300 mg by mouth 2 (two) times daily. 07/05/22  Yes Sebastian Toribio GAILS, MD  albuterol  (VENTOLIN  HFA) 108 (90 Base) MCG/ACT inhaler TAKE 2 PUFFS BY MOUTH EVERY 6 HOURS AS NEEDED FOR WHEEZE OR SHORTNESS OF BREATH 11/28/23  Yes Young, Clinton D, MD  apixaban  (ELIQUIS ) 5 MG TABS tablet Take 1 tablet (5 mg total) by mouth 2 (two) times daily. 11/26/23  Yes Lavona Agent, MD  benzonatate  (TESSALON ) 200 MG capsule TAKE 1 CAPSULE (200 MG TOTAL) BY MOUTH 3 (THREE) TIMES DAILY AS NEEDED FOR COUGH. 06/15/23  Yes Young, Clinton D, MD  Biotin  10000 MCG TABS Take 1,000 mcg by mouth daily.   Yes [provider]  cetirizine (ZYRTEC) 10 MG tablet Take 10 mg by mouth at bedtime.   Yes [provider]  dicyclomine  (BENTYL ) 20 MG tablet Take 20 mg by mouth every 6 (six) hours as needed for spasms. 09/15/22  Yes [provider]  docusate sodium  (COLACE) 250 MG capsule Take 250 mg by mouth at bedtime.   Yes [provider]  donepezil  (ARICEPT ) 23 MG TABS tablet Take 23 mg by mouth daily. 08/20/23  Yes [provider]  DULoxetine  (CYMBALTA ) 60 MG capsule Take 2 capsules (120 mg total) by mouth daily. Patient taking differently: Take 60 mg by mouth daily. 11/16/23  Yes   furosemide  (LASIX ) 20 MG tablet Take 1 tablet (20 mg total) by mouth daily. Patient taking differently: Take 20 mg by mouth in the morning. 11/23/23  Yes Odell Celinda Balo, MD  hydrOXYzine  (ATARAX ) 25 MG tablet Take 1 tablet (25 mg total) by mouth every 6 (six) hours as needed for anxiety. Patient taking differently: Take 50 mg by mouth at bedtime. 10/19/23  Yes   levothyroxine  (SYNTHROID ) 25 MCG tablet Take 12.5 mcg by mouth daily before  breakfast.   Yes [provider]  memantine  (NAMENDA ) 10 MG tablet Take 1 tablet (10 mg total) by mouth 2 (two) times daily. 10/05/23  Yes   methocarbamol (ROBAXIN) 500 MG tablet Take 500 mg by mouth 2 (two) times daily. 07/30/23  Yes [provider]  metoprolol  succinate (TOPROL  XL) 50 MG 24 hr tablet Take 1 tablet (50 mg total) by mouth at bedtime. 11/26/23  Yes Lavona Agent, MD  Multiple Vitamins-Minerals (CENTRUM SILVER) CHEW Chew 1 tablet by mouth daily.   Yes [provider]  nitroGLYCERIN  (NITROSTAT ) 0.4 MG SL tablet Place 0.4 mg under the tongue as needed.   Yes [provider]  ondansetron  (ZOFRAN -ODT) 4 MG disintegrating tablet Take 4 mg by mouth every 8 (eight) hours as needed for nausea or vomiting.   Yes [provider]  oxybutynin  (DITROPAN ) 5 MG tablet Take 5 mg by mouth 2 (two) times daily.   Yes [provider]  Polyethyl Glycol-Propyl Glycol (  SYSTANE OP) Place 1 drop into both eyes 2 (two) times daily as needed (for dryness).   Yes [provider]  potassium chloride  SA (KLOR-CON  M) 20 MEQ tablet Take 1 tablet (20 mEq total) by mouth daily. Patient taking differently: Take 20 mEq by mouth at bedtime. 11/26/23  Yes Odell Celinda Balo, MD  Probiotic Product (PROBIOTIC PO) Take 1 tablet by mouth daily.   Yes [provider]  Simethicone (GAS-X EXTRA STRENGTH PO) Take 1 tablet by mouth daily as needed.   Yes [provider]  tamsulosin  (FLOMAX ) 0.4 MG CAPS capsule Take 0.4 mg by mouth at bedtime.   Yes [provider]  traMADol  (ULTRAM ) 50 MG tablet Take 50 mg by mouth 2 (two) times daily as needed.   Yes [provider]  valsartan  (DIOVAN ) 80 MG tablet TAKE 1 TABLET BY MOUTH EVERY DAY Patient taking differently: Take 40 mg by mouth daily. 09/10/23  Yes Lavona Agent, MD    No current facility-administered medications for this encounter.   Current Outpatient Medications  Medication  Sig Dispense Refill   acetaminophen  (TYLENOL ) 650 MG CR tablet Take 1 tablet (650 mg total) by mouth every 8 (eight) hours as needed for pain (headaches). (Patient taking differently: Take 1,300 mg by mouth 2 (two) times daily.)     albuterol  (VENTOLIN  HFA) 108 (90 Base) MCG/ACT inhaler TAKE 2 PUFFS BY MOUTH EVERY 6 HOURS AS NEEDED FOR WHEEZE OR SHORTNESS OF BREATH 18 each 0   apixaban  (ELIQUIS ) 5 MG TABS tablet Take 1 tablet (5 mg total) by mouth 2 (two) times daily. 60 tablet 5   benzonatate  (TESSALON ) 200 MG capsule TAKE 1 CAPSULE (200 MG TOTAL) BY MOUTH 3 (THREE) TIMES DAILY AS NEEDED FOR COUGH. 30 capsule 1   Biotin  10000 MCG TABS Take 1,000 mcg by mouth daily.     cetirizine (ZYRTEC) 10 MG tablet Take 10 mg by mouth at bedtime.     dicyclomine  (BENTYL ) 20 MG tablet Take 20 mg by mouth every 6 (six) hours as needed for spasms.     docusate sodium  (COLACE) 250 MG capsule Take 250 mg by mouth at bedtime.     donepezil  (ARICEPT ) 23 MG TABS tablet Take 23 mg by mouth daily.     DULoxetine  (CYMBALTA ) 60 MG capsule Take 2 capsules (120 mg total) by mouth daily. (Patient taking differently: Take 60 mg by mouth daily.) 180 capsule 1   furosemide  (LASIX ) 20 MG tablet Take 1 tablet (20 mg total) by mouth daily. (Patient taking differently: Take 20 mg by mouth in the morning.) 90 tablet 3   hydrOXYzine  (ATARAX ) 25 MG tablet Take 1 tablet (25 mg total) by mouth every 6 (six) hours as needed for anxiety. (Patient taking differently: Take 50 mg by mouth at bedtime.) 90 tablet 1   levothyroxine  (SYNTHROID ) 25 MCG tablet Take 12.5 mcg by mouth daily before breakfast.     memantine  (NAMENDA ) 10 MG tablet Take 1 tablet (10 mg total) by mouth 2 (two) times daily. 180 tablet 3   methocarbamol (ROBAXIN) 500 MG tablet Take 500 mg by mouth 2 (two) times daily.     metoprolol  succinate (TOPROL  XL) 50 MG 24 hr tablet Take 1 tablet (50 mg total) by mouth at bedtime. 90 tablet 3   Multiple Vitamins-Minerals (CENTRUM  SILVER) CHEW Chew 1 tablet by mouth daily.     nitroGLYCERIN  (NITROSTAT ) 0.4 MG SL tablet Place 0.4 mg under the tongue as needed.     ondansetron  (  ZOFRAN -ODT) 4 MG disintegrating tablet Take 4 mg by mouth every 8 (eight) hours as needed for nausea or vomiting.     oxybutynin  (DITROPAN ) 5 MG tablet Take 5 mg by mouth 2 (two) times daily.     Polyethyl Glycol-Propyl Glycol (SYSTANE OP) Place 1 drop into both eyes 2 (two) times daily as needed (for dryness).     potassium chloride  SA (KLOR-CON  M) 20 MEQ tablet Take 1 tablet (20 mEq total) by mouth daily. (Patient taking differently: Take 20 mEq by mouth at bedtime.) 90 tablet 3   Probiotic Product (PROBIOTIC PO) Take 1 tablet by mouth daily.     Simethicone (GAS-X EXTRA STRENGTH PO) Take 1 tablet by mouth daily as needed.     tamsulosin  (FLOMAX ) 0.4 MG CAPS capsule Take 0.4 mg by mouth at bedtime.     traMADol  (ULTRAM ) 50 MG tablet Take 50 mg by mouth 2 (two) times daily as needed.     valsartan  (DIOVAN ) 80 MG tablet TAKE 1 TABLET BY MOUTH EVERY DAY (Patient taking differently: Take 40 mg by mouth daily.) 90 tablet 2    Allergies as of 01/24/2024 - Review Complete 01/24/2024  Allergen Reaction Noted   Latex Rash 03/31/2014   Sulfa  antibiotics Anaphylaxis and Nausea Only 01/22/2017   Aspirin  Other (See Comments) 03/31/2014   Nsaids Other (See Comments) 06/30/2022   Oxycodone -acetaminophen  Nausea Only 03/31/2014    Family History  Problem Relation Age of Onset   Hiatal hernia Mother    Heart attack Mother 65   Heart disease Father 59       CAD @ autopsy   Cancer Father        renal cancer   Heart attack Brother 29       fatal   Diabetes Neg Hx    Stroke Neg Hx     Social History   Socioeconomic History   Marital status: Widowed    Spouse name: Not on file   Number of children: 1   Years of education: Not on file   Highest education level: Not on file  Occupational History   Not on file  Tobacco Use   Smoking status: Former     Current packs/day: 0.00    Average packs/day: 0.5 packs/day for 6.0 years (3.0 ttl pk-yrs)    Types: Cigarettes    Start date: 06/30/1956    Quit date: 06/30/1962    Years since quitting: 61.6    Passive exposure: Never   Smokeless tobacco: Never   Tobacco comments:    Quit age 65  Vaping Use   Vaping status: Never Used  Substance and Sexual Activity   Alcohol  use: Yes    Alcohol /week: 3.0 standard drinks of alcohol     Types: 3 Glasses of wine per week    Comment: wine  : < 3 glasses weekly   Drug use: No   Sexual activity: Yes    Birth control/protection: Post-menopausal, Surgical  Other Topics Concern   Not on file  Social History Narrative   Lives husband and daughter lives with her.    Social Drivers of Health   Financial Resource Strain: Low Risk  (03/12/2021)   Received from [facility] visits prior to 08/30/2022.   Overall Financial Resource Strain (CARDIA)    Difficulty of Paying Living Expenses: Not very hard  Food Insecurity: No Food Insecurity (07/09/2023)   Hunger Vital Sign    Worried About Running Out of Food in the Last Year: Never  true    Ran Out of Food in the Last Year: Never true  Transportation Needs: No Transportation Needs (07/09/2023)   PRAPARE - Administrator, Civil Service (Medical): No    Lack of Transportation (Non-Medical): No  Physical Activity: Inactive (03/12/2021)   Received from [facility] visits prior to 08/30/2022.   Exercise Vital Sign    On average, how many days per week do you engage in moderate to strenuous exercise (like a brisk walk)?: 0 days    On average, how many minutes do you engage in exercise at this level?: 0 min  Stress: Stress Concern Present (03/12/2021)   Received from [facility] visits prior to 08/30/2022.   [employer] of Occupational Health - Occupational Stress Questionnaire    Feeling of Stress : Rather much  Social Connections: Unknown  (07/09/2023)   Social Connection and Isolation Panel    Frequency of Communication with Friends and Family: More than three times a week    Frequency of Social Gatherings with Friends and Family: More than three times a week    Attends Religious Services: More than 4 times per year    Active Member of [employer] or Organizations: Patient declined    Attends Banker Meetings: Patient declined    Marital Status: Patient declined  Intimate Partner Violence: Not At Risk (07/09/2023)   Humiliation, Afraid, Rape, and Kick questionnaire    Fear of Current or Ex-Partner: No    Emotionally Abused: No    Physically Abused: No    Sexually Abused: No    Review of Systems: As per HPI.  Physical Exam: Vital signs in last 24 hours: Temp:  [98.6 F (37 C)] 98.6 F (37 C) (07/27 0851) Pulse Rate:  [59-68] 59 (07/27 1145) Resp:  [15-20] 18 (07/27 1145) BP: (118-141)/(50-80) 129/50 (07/27 1145) SpO2:  [99 %-100 %] 100 % (07/27 1145)    General:   Alert,  Well-developed, well-nourished, pleasant and cooperative in NAD Head:  Normocephalic and atraumatic. Eyes:  Sclera clear, no icterus.  Mild pallor Ears:  Normal auditory acuity. Nose:  No deformity, discharge,  or lesions. Mouth:  No deformity or lesions.  Oropharynx pink & moist. Neck:  Supple; no masses or thyromegaly. Lungs:  Clear throughout to auscultation.   No wheezes, crackles, or rhonchi. No acute distress. Heart:  Regular rate and rhythm; no murmurs, clicks, rubs,  or gallops. Extremities:  Without clubbing or edema. Neurologic:  Alert and  oriented x4;  grossly normal neurologically. Skin:  Intact without significant lesions or rashes. Psych:  Alert and cooperative. Normal mood and affect. Abdomen:  Soft, nontender and nondistended. No masses, hepatosplenomegaly or hernias noted. Normal bowel sounds, without guarding, and without rebound.         Lab Results: Recent Labs    01/24/24 0854 01/24/24 0916  WBC 7.9  --    HGB 7.9* 8.8*  HCT 24.5* 26.0*  PLT 374  --    BMET Recent Labs    01/24/24 0854 01/24/24 0916  NA 134* 136  K 3.2* 3.7  CL 104 105  CO2 17*  --   GLUCOSE 125* 119*  BUN 21 26*  CREATININE 1.38* 1.40*  CALCIUM  9.3  --    LFT Recent Labs    01/24/24 0854  PROT 6.6  ALBUMIN 3.4*  AST 25  ALT 17  ALKPHOS 53  BILITOT 0.6   PT/INR No results for input(s): LABPROT, INR  in the last 72 hours.  Studies/Results: CT CHEST ABDOMEN PELVIS W CONTRAST Result Date: 01/24/2024 CLINICAL DATA:  Patient fell out of bed this morning and hit the back of her head. Dizziness. Right shoulder pain. EXAM: CT CHEST, ABDOMEN, AND PELVIS WITH CONTRAST TECHNIQUE: Multidetector CT imaging of the chest, abdomen and pelvis was performed following the standard protocol during bolus administration of intravenous contrast. RADIATION DOSE REDUCTION: This exam was performed according to the departmental dose-optimization program which includes automated exposure control, adjustment of the mA and/or kV according to patient size and/or use of iterative reconstruction technique. CONTRAST:  75mL OMNIPAQUE  IOHEXOL  350 MG/ML SOLN COMPARISON:  11/21/2023 FINDINGS: CT CHEST FINDINGS Cardiovascular: Heart size mildly increased. No substantial pericardial effusion. Mild atherosclerotic calcification is noted in the wall of the thoracic aorta. No large central pulmonary embolus. Mediastinum/Nodes: No mediastinal lymphadenopathy. There is no hilar lymphadenopathy. The esophagus has normal imaging features. There is no axillary lymphadenopathy. Portions of the axilla in supraclavicular regions obscured by beam hardening artifact from bilateral shoulder replacement. Lungs/Pleura: 4 mm right lower lobe nodule seen on 92/6. 4 mm left lower lobe nodule visible on 77/6. 2 mm left upper lobe nodule identified on 56/6. Calcified granuloma right lower lobe on 85/6. Tiny lower lobe pulmonary nodules are stable since prior. No focal  airspace consolidation. There is no evidence of pleural effusion. Musculoskeletal: Bilateral shoulder replacement. Chronic fracture nonunion of the sternum. No worrisome lytic or sclerotic osseous abnormality. CT ABDOMEN PELVIS FINDINGS Hepatobiliary:. A tiny hypodensity in the liver parenchyma is too small to characterize but is statistically most likely benign. No followup imaging is recommended. Gallbladder is surgically absent. No intrahepatic or extrahepatic biliary dilation. Pancreas: Diffuse parenchymal atrophy without main duct dilatation. Spleen: No splenomegaly. No suspicious focal mass lesion. Adrenals/Urinary Tract: No adrenal nodule or mass. Tiny well-defined homogeneous low-density lesions in both kidneys are too small to characterize but are statistically most likely benign and probably cysts. No followup imaging is recommended. No evidence for hydroureter. The urinary bladder appears normal for the degree of distention. Stomach/Bowel: Stomach is unremarkable. No gastric wall thickening. No evidence of outlet obstruction. Duodenum is normally positioned as is the ligament of Treitz. No small bowel wall thickening. No small bowel dilatation. The terminal ileum is normal. Nonvisualization of the appendix is consistent with the reported history of appendectomy. No gross colonic mass. No colonic wall thickening. Vascular/Lymphatic: There is moderate atherosclerotic calcification of the abdominal aorta without aneurysm. There is no gastrohepatic or hepatoduodenal ligament lymphadenopathy. No retroperitoneal or mesenteric lymphadenopathy. No pelvic sidewall lymphadenopathy. Reproductive: Hysterectomy.  There is no adnexal mass. Other: No intraperitoneal free fluid. Musculoskeletal: No worrisome lytic or sclerotic osseous abnormality. Stable superior endplate compression deformity at L1 since 11/21/2023 no evidence for thoracolumbar spine fracture. No evidence for an acute fracture of the bony pelvis.  IMPRESSION: 1. No acute findings in the chest, abdomen, or pelvis. No free fluid in the peritoneal cavity. Specifically, no findings to explain the patient's history of dizziness and right shoulder pain. 2. Stable tiny bilateral pulmonary nodules. No follow-up needed if patient is low-risk (and has no known or suspected primary neoplasm). Non-contrast chest CT can be considered in 12 months if patient is high-risk. This recommendation follows the consensus statement: Guidelines for Management of Incidental Pulmonary Nodules Detected on CT Images: From the Fleischner Society 2017; Radiology 2017; 284:228-243. 3. Stable superior endplate compression deformity at L1 since 11/21/2023. 4. Chronic fracture nonunion of the sternum. 5.  Aortic Atherosclerosis (ICD10-I70.0). Electronically  Signed   By: Camellia Candle M.D.   On: 01/24/2024 10:03   DG Chest Portable 1 View Result Date: 01/24/2024 CLINICAL DATA:  Fall. EXAM: PORTABLE CHEST 1 VIEW COMPARISON:  Chest radiograph dated 11/21/2023. FINDINGS: No focal consolidation, pleural effusion or pneumothorax. The cardiac silhouette is within normal limits. No acute osseous pathology. Bilateral shoulder arthroplasties. IMPRESSION: No active disease. Electronically Signed   By: Vanetta Chou M.D.   On: 01/24/2024 09:54   DG Pelvis Portable Result Date: 01/24/2024 CLINICAL DATA:  Fall. EXAM: PORTABLE PELVIS 1-2 VIEWS COMPARISON:  Abdomen and pelvis CT 11/21/2023. FINDINGS: There is no evidence of pelvic fracture or diastasis. No pelvic bone lesions are seen. IMPRESSION: Negative. Electronically Signed   By: Camellia Candle M.D.   On: 01/24/2024 09:54   CT Cervical Spine Wo Contrast Result Date: 01/24/2024 EXAM: CT CERVICAL SPINE WITHOUT CONTRAST 01/24/2024 09:32:55 AM TECHNIQUE: CT of the cervical spine was performed without the administration of intravenous contrast. Multiplanar reformatted images are provided for review. Automated exposure control, iterative  reconstruction, and/or weight based adjustment of the mA/kV was utilized to reduce the radiation dose to as low as reasonably achievable. COMPARISON: CT of the cervical spine 09/18/2023. MRI of the cervical spine 10/25/2023. CLINICAL HISTORY: Neck trauma (Age >= 65y). CT Head Wo Contrast; Head trauma, minor (Age >= 65y); CT Cervical Spine Wo Contrast; Neck trauma (Age >= 65y); CT CHEST ABDOMEN PELVIS W CONTRAST; Polytrauma, blunt. FINDINGS: CERVICAL SPINE: BONES AND ALIGNMENT: Straightening of the normal cervical lordosis is similar to the prior study. Degenerative grade 1 anterolisthesis at C4-5, C7-T1 and T2-T3 is stable. DEGENERATIVE CHANGES: Chronic endplate degenerative changes are again noted at C5-6, C6-7, T1-2 and T2-3. Severe foraminal stenosis is again noted bilaterally at C3-4. Moderate left foraminal stenosis is stable at C5-6. SOFT TISSUES: No prevertebral soft tissue swelling. VASCULATURE: Atherosclerosis is present at the carotid bifurcation without definite stenosis. IMPRESSION: 1. No acute abnormality of the cervical spine related to the reported neck trauma. 2. Straightening of the normal cervical lordosis, similar to the prior study. 3. Degenerative grade 1 anterolisthesis at C4-5, C7-T1, and T2-T3, stable. 4. Chronic endplate degenerative changes at C5-6, C6-7, T1-2, and T2-3. 5. Severe foraminal stenosis bilaterally at C3-4 and moderate left foraminal stenosis at C5-6, stable. Electronically signed by: Lonni Necessary MD 01/24/2024 09:41 AM EDT RP Workstation: [id number]   CT Head Wo Contrast Result Date: 01/24/2024 EXAM: CT HEAD WITHOUT CONTRAST 01/24/2024 09:32:55 AM TECHNIQUE: CT of the head was performed without the administration of intravenous contrast. Automated exposure control, iterative reconstruction, and/or weight based adjustment of the mA/kV was utilized to reduce the radiation dose to as low as reasonably achievable. COMPARISON: CT head without contrast 09/18/2023. CLINICAL  HISTORY: Head trauma, minor (Age >= 65y). Polytrauma, blunt. FINDINGS: BRAIN AND VENTRICLES: No acute hemorrhage. Gray-white differentiation is preserved. No hydrocephalus. No extra-axial collection. No mass effect or midline shift. Moderate periventricular and subcortical white matter hypoattenuation is noted bilaterally without significant interval change. ORBITS: Bilateral lens replacements are noted. The globes and orbits are otherwise within normal limits. SINUSES: No acute abnormality. SOFT TISSUES AND SKULL: No acute soft tissue abnormality. No skull fracture. VASCULATURE: Atherosclerotic calcifications are present in the cavernous carotid arteries bilaterally. No hyperdense vessel is present. IMPRESSION: 1. No acute intracranial abnormality related to the minor head trauma. 2. Moderate periventricular and subcortical white matter hypoattenuation bilaterally without significant interval change. Electronically signed by: Lonni Necessary MD 01/24/2024 09:37 AM EDT RP Workstation: [id number]  Impression: Rectal bleeding, hemoglobin 7.9, subsequent 8.8, hemoglobin was 9.7 on 01/14/2024 Slightly elevated BUN of 26 with a creatinine of 1.4 and GFR of 38 Rectal bleeding, thought to be related to hemorrhoids, intermittent, ongoing, no improvement with suppositories and hemorrhoidal ointments, daughter requesting for colonoscopy while she is in the hospital.  Dysphagia, for the last 2 weeks, nonprogressive, not associated with weight loss, only to solids  CT: No acute findings in chest, abdomen, pelvis, tiny bilateral pulmonary nodules, stable superior endplate compression deformity at L1 Unremarkable stomach, no bowel obstruction, no small bowel wall thickening, no gross colonic lesion or colonic wall thickening  Was on Eliquis , last dose on Saturday, 01/23/2024  Plan: Diagnostic colonoscopy in a.m. tomorrow along with EGD with possible dilatation with Dr. Kriss.      LOS: 0 days   Estelita Manas, MD  01/24/2024, 12:01 PM

## 2024-01-24 NOTE — ED Notes (Signed)
 Hospitalist at bedside

## 2024-01-24 NOTE — ED Notes (Signed)
 Trauma Response Nurse Documentation   Brenda Stephens is a 83 y.o. female arriving to [facility] ED via EMS  On Eliquis  (apixaban ) daily. Trauma was activated as a Level 2 by ED Charge RN based on the following trauma criteria Elderly patients > 65 with head trauma on anti-coagulation (excluding ASA).  Patient cleared for CT by Dr. Neysa. Pt transported to CT with trauma response nurse present to monitor. RN remained with the patient throughout their absence from the department for clinical observation.   GCS 15.  History   Past Medical History:  Diagnosis Date   Acute deep vein thrombosis (DVT) of proximal vein of right lower extremity (HCC) 07/02/2022   Acute pulmonary embolism (HCC) 07/15/2022   AKI (acute kidney injury) (HCC) 02/16/2023   Allergy    latex   Anemia    Anxiety    Arthritis    Atrial fibrillation with RVR (HCC) 02/12/2023   Bilateral pulmonary embolism (HCC) 06/30/2022   Cancer (HCC) 01/2017   right breast cancer   Depression    Dyslipidemia    Eustachian tube dysfunction    GERD (gastroesophageal reflux disease)    Grade I diastolic dysfunction 07/15/2022   Hx of cardiovascular stress test    Myoview  10/13:  apical thinning, EF 72%, no ischemia   Hx of colonic polyps    Dr Rosalie   Hypertension    Hypothyroidism    Other abnormal glucose    Pulmonary embolism (HCC) 06/30/2022     Past Surgical History:  Procedure Laterality Date   ABDOMINAL HYSTERECTOMY     BSO for uterine fibroids   ACHILLES TENDON SURGERY Right 10/07/2012   Procedure: RIGHT REPAIR RUPTURE TENDON PRIMARY OPEN/PERCUTANEOUS ;  Surgeon: Toribio JULIANNA Chancy, MD;  Location: [facility];  Service: Orthopedics;  Laterality: Right;   APPENDECTOMY     BREAST LUMPECTOMY WITH RADIOACTIVE SEED AND SENTINEL LYMPH NODE BIOPSY Right 03/16/2017   Procedure: RIGHT BREAST LUMPECTOMY WITH RADIOACTIVE SEED AND RIGHT AXILLARY SENTINEL  NODE BIOPSY ERAS PATHWAY;  Surgeon: Ebbie Cough, MD;   Location: [facility];  Service: General;  Laterality: Right;  PECTORAL BLOCK   CATARACT EXTRACTION, BILATERAL     CHOLECYSTECTOMY     COLONOSCOPY W/ POLYPECTOMY     Dr Rosalie   EAR CYST EXCISION Right 10/07/2012   Procedure: 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 ;  Surgeon: Toribio JULIANNA Chancy, MD;  Location: [facility];  Service: Orthopedics;  Laterality: Right;   EXPLORATORY LAPAROTOMY     LEFT HEART CATH AND CORONARY ANGIOGRAPHY N/A 08/02/2019   Procedure: LEFT HEART CATH AND CORONARY ANGIOGRAPHY;  Surgeon: Swaziland, Peter M, MD;  Location: [facility];  Service: Cardiovascular;  Laterality: N/A;   REVISION TOTAL SHOULDER TO REVERSE TOTAL SHOULDER Left 01/12/2019   Procedure: REVISION TOTAL SHOULDER TO REVERSE TOTAL SHOULDER;  Surgeon: Cristy Bonner DASEN, MD;  Location: [facility];  Service: Orthopedics;  Laterality: Left;   SINUS SURGERY WITH INSTATRAK     TOTAL SHOULDER REPLACEMENT     Right shoulder   TOTAL SHOULDER REPLACEMENT  05/2010   Left   TUBAL LIGATION       Initial Focused Assessment (If applicable, or please see trauma documentation): Airway: Intact, patent  Breathing: Breath sounds clear, equal bilaterally, no SOB, no CP. Circulation: No obvious external bleeding noted. Pulses intact throughout.  SBP WDL.  18G PIV to L AC Disability: PERRLA. EMS field c-collar on. MAE equally, equal sensation  throughout.   CT's Completed:   CT Head, CT C-Spine, and CT abdomen/pelvis w/ contrast   Interventions:  Labs drawn Miami J applied in the place of field collar Undressed and assessed pt CXR Pelvic XR CT head, c-spine and abd w/ contrast.   Plan for disposition:  Other Unsure at this time.   Consults completed:  none at 1000.  Event Summary: Pt was BIB EMS after having a fall out of bed this morning. Pt struck the back of her head and her shoulders.  No LOC.  Pt does take eliquis  however denies taking any meds this morning.  Pt c/o neck,  head and bilateral shoulder pain.  Pt has been having falls recently and was just seen about 10 days ago due to a fall.  Pt also reports that she has been having bloody stools for approx 2 weeks and has been seeing a GI doctor for this.  Hgb has dropped since 10 days ago and BP was a little soft upon arrival however WDL at this time.    Bedside handoff with ED RN Brenda Stephens.    Brenda Stephens  Trauma Response RN  Please call TRN at [phone] for further assistance.

## 2024-01-24 NOTE — ED Notes (Signed)
 EMS field collar replaced with Miami J due to discomfort and pt c/o neck pain.

## 2024-01-24 NOTE — ED Provider Notes (Addendum)
 [place] EMERGENCY DEPARTMENT AT [facility] Provider Note   CSN: [id number] Arrival date & time: 01/24/24  9157     Patient presents with: Brenda Stephens   Brenda Stephens is a 83 y.o. female.   This is a 83 year old female presenting emergency department as a level 2 trauma after a fall.  Was ambulating to the bathroom this morning, not sure what caused her to fall but she fell backwards hit her head, unsure of LOC.  Complains of diffuse pain all over, as well as the back of the head.  Denies chest pain, shortness of breath endorses abdominal pain, but chronic in nature.   Fall       Prior to Admission medications   Medication Sig Start Date End Date Taking? Authorizing Provider  acetaminophen  (TYLENOL ) 650 MG CR tablet Take 1 tablet (650 mg total) by mouth every 8 (eight) hours as needed for pain (headaches). Patient taking differently: Take 1,300 mg by mouth 2 (two) times daily. 07/05/22  Yes Sebastian Toribio GAILS, MD  albuterol  (VENTOLIN  HFA) 108 (90 Base) MCG/ACT inhaler TAKE 2 PUFFS BY MOUTH EVERY 6 HOURS AS NEEDED FOR WHEEZE OR SHORTNESS OF BREATH 11/28/23  Yes Dermot Gremillion, Reggy D, MD  apixaban  (ELIQUIS ) 5 MG TABS tablet Take 1 tablet (5 mg total) by mouth 2 (two) times daily. 11/26/23  Yes Lavona Agent, MD  benzonatate  (TESSALON ) 200 MG capsule TAKE 1 CAPSULE (200 MG TOTAL) BY MOUTH 3 (THREE) TIMES DAILY AS NEEDED FOR COUGH. 06/15/23  Yes Bj Morlock, Clinton D, MD  Biotin  10000 MCG TABS Take 1,000 mcg by mouth daily.   Yes [provider]  cetirizine (ZYRTEC) 10 MG tablet Take 10 mg by mouth at bedtime.   Yes [provider]  dicyclomine  (BENTYL ) 20 MG tablet Take 20 mg by mouth every 6 (six) hours as needed for spasms. 09/15/22  Yes [provider]  docusate sodium  (COLACE) 250 MG capsule Take 250 mg by mouth at bedtime.   Yes [provider]  donepezil  (ARICEPT ) 23 MG TABS tablet Take 23 mg by mouth daily. 08/20/23  Yes [provider]   DULoxetine  (CYMBALTA ) 60 MG capsule Take 2 capsules (120 mg total) by mouth daily. Patient taking differently: Take 60 mg by mouth daily. 11/16/23  Yes   furosemide  (LASIX ) 20 MG tablet Take 1 tablet (20 mg total) by mouth daily. Patient taking differently: Take 20 mg by mouth in the morning. 11/23/23  Yes Odell Celinda Balo, MD  hydrOXYzine  (ATARAX ) 25 MG tablet Take 1 tablet (25 mg total) by mouth every 6 (six) hours as needed for anxiety. Patient taking differently: Take 50 mg by mouth at bedtime. 10/19/23  Yes   levothyroxine  (SYNTHROID ) 25 MCG tablet Take 12.5 mcg by mouth daily before breakfast.   Yes [provider]  memantine  (NAMENDA ) 10 MG tablet Take 1 tablet (10 mg total) by mouth 2 (two) times daily. 10/05/23  Yes   methocarbamol (ROBAXIN) 500 MG tablet Take 500 mg by mouth 2 (two) times daily. 07/30/23  Yes [provider]  metoprolol  succinate (TOPROL  XL) 50 MG 24 hr tablet Take 1 tablet (50 mg total) by mouth at bedtime. 11/26/23  Yes Lavona Agent, MD  Multiple Vitamins-Minerals (CENTRUM SILVER) CHEW Chew 1 tablet by mouth daily.   Yes [provider]  nitroGLYCERIN  (NITROSTAT ) 0.4 MG SL tablet Place 0.4 mg under the tongue as needed.   Yes [provider]  ondansetron  (ZOFRAN -ODT) 4 MG disintegrating tablet Take 4 mg  by mouth every 8 (eight) hours as needed for nausea or vomiting.   Yes [provider]  oxybutynin  (DITROPAN ) 5 MG tablet Take 5 mg by mouth 2 (two) times daily.   Yes [provider]  Polyethyl Glycol-Propyl Glycol (SYSTANE OP) Place 1 drop into both eyes 2 (two) times daily as needed (for dryness).   Yes [provider]  potassium chloride  SA (KLOR-CON  M) 20 MEQ tablet Take 1 tablet (20 mEq total) by mouth daily. Patient taking differently: Take 20 mEq by mouth at bedtime. 11/26/23  Yes Odell Celinda Balo, MD  Probiotic Product (PROBIOTIC PO) Take 1 tablet by mouth daily.   Yes [provider]   Simethicone (GAS-X EXTRA STRENGTH PO) Take 1 tablet by mouth daily as needed.   Yes [provider]  tamsulosin  (FLOMAX ) 0.4 MG CAPS capsule Take 0.4 mg by mouth at bedtime.   Yes [provider]  traMADol  (ULTRAM ) 50 MG tablet Take 50 mg by mouth 2 (two) times daily as needed.   Yes [provider]  valsartan  (DIOVAN ) 80 MG tablet TAKE 1 TABLET BY MOUTH EVERY DAY Patient taking differently: Take 40 mg by mouth daily. 09/10/23  Yes Lavona Agent, MD    Allergies: Latex, Sulfa  antibiotics, Aspirin , Nsaids, and Oxycodone -acetaminophen     Review of Systems  Updated Vital Signs BP (!) 129/50   Pulse (!) 59   Temp 98.6 F (37 C) (Oral)   Resp 18   SpO2 100%   Physical Exam Vitals and nursing note reviewed.  Constitutional:      General: She is not in acute distress.    Appearance: She is not toxic-appearing.  HENT:     Head: Normocephalic and atraumatic.     Nose: Nose normal.     Mouth/Throat:     Mouth: Mucous membranes are moist.  Eyes:     Extraocular Movements: Extraocular movements intact.     Conjunctiva/sclera: Conjunctivae normal.  Neck:     Comments: In c-collar.  After negative CT scan, c-collar removed and C-spine cleared Cardiovascular:     Rate and Rhythm: Normal rate and regular rhythm.  Pulmonary:     Effort: Pulmonary effort is normal.     Breath sounds: Normal breath sounds.  Abdominal:     General: Abdomen is flat. There is no distension.     Palpations: Abdomen is soft.     Tenderness: There is abdominal tenderness (mild diffuse). There is no guarding or rebound.  Musculoskeletal:        General: No tenderness.     Comments: Chest and pelvis stable.  No midline spinal tenderness.  5 out of 5 bicep strength tricep strength, plantarflexion dorsiflexion.  No overt bony tenderness or deformities on exam  Skin:    General: Skin is warm and dry.     Capillary Refill: Capillary refill takes less than 2 seconds.  Neurological:      Mental Status: She is alert. Mental status is at baseline.  Psychiatric:        Mood and Affect: Mood normal.        Behavior: Behavior normal.     (all labs ordered are listed, but only abnormal results are displayed) Labs Reviewed  CBC WITH DIFFERENTIAL/PLATELET - Abnormal; Notable for the following components:      Result Value   RBC 2.92 (*)    Hemoglobin 7.9 (*)    HCT 24.5 (*)    All other components within normal limits  COMPREHENSIVE METABOLIC  PANEL WITH GFR - Abnormal; Notable for the following components:   Sodium 134 (*)    Potassium 3.2 (*)    CO2 17 (*)    Glucose, Bld 125 (*)    Creatinine, Ser 1.38 (*)    Albumin 3.4 (*)    GFR, Estimated 38 (*)    All other components within normal limits  I-STAT CHEM 8, ED - Abnormal; Notable for the following components:   BUN 26 (*)    Creatinine, Ser 1.40 (*)    Glucose, Bld 119 (*)    TCO2 19 (*)    Hemoglobin 8.8 (*)    HCT 26.0 (*)    All other components within normal limits  LIPASE, BLOOD  URINALYSIS, ROUTINE W REFLEX MICROSCOPIC    EKG: None  Radiology: CT CHEST ABDOMEN PELVIS W CONTRAST Result Date: 01/24/2024 CLINICAL DATA:  Patient fell out of bed this morning and hit the back of her head. Dizziness. Right shoulder pain. EXAM: CT CHEST, ABDOMEN, AND PELVIS WITH CONTRAST TECHNIQUE: Multidetector CT imaging of the chest, abdomen and pelvis was performed following the standard protocol during bolus administration of intravenous contrast. RADIATION DOSE REDUCTION: This exam was performed according to the departmental dose-optimization program which includes automated exposure control, adjustment of the mA and/or kV according to patient size and/or use of iterative reconstruction technique. CONTRAST:  75mL OMNIPAQUE  IOHEXOL  350 MG/ML SOLN COMPARISON:  11/21/2023 FINDINGS: CT CHEST FINDINGS Cardiovascular: Heart size mildly increased. No substantial pericardial effusion. Mild atherosclerotic calcification is noted in  the wall of the thoracic aorta. No large central pulmonary embolus. Mediastinum/Nodes: No mediastinal lymphadenopathy. There is no hilar lymphadenopathy. The esophagus has normal imaging features. There is no axillary lymphadenopathy. Portions of the axilla in supraclavicular regions obscured by beam hardening artifact from bilateral shoulder replacement. Lungs/Pleura: 4 mm right lower lobe nodule seen on 92/6. 4 mm left lower lobe nodule visible on 77/6. 2 mm left upper lobe nodule identified on 56/6. Calcified granuloma right lower lobe on 85/6. Tiny lower lobe pulmonary nodules are stable since prior. No focal airspace consolidation. There is no evidence of pleural effusion. Musculoskeletal: Bilateral shoulder replacement. Chronic fracture nonunion of the sternum. No worrisome lytic or sclerotic osseous abnormality. CT ABDOMEN PELVIS FINDINGS Hepatobiliary:. A tiny hypodensity in the liver parenchyma is too small to characterize but is statistically most likely benign. No followup imaging is recommended. Gallbladder is surgically absent. No intrahepatic or extrahepatic biliary dilation. Pancreas: Diffuse parenchymal atrophy without main duct dilatation. Spleen: No splenomegaly. No suspicious focal mass lesion. Adrenals/Urinary Tract: No adrenal nodule or mass. Tiny well-defined homogeneous low-density lesions in both kidneys are too small to characterize but are statistically most likely benign and probably cysts. No followup imaging is recommended. No evidence for hydroureter. The urinary bladder appears normal for the degree of distention. Stomach/Bowel: Stomach is unremarkable. No gastric wall thickening. No evidence of outlet obstruction. Duodenum is normally positioned as is the ligament of Treitz. No small bowel wall thickening. No small bowel dilatation. The terminal ileum is normal. Nonvisualization of the appendix is consistent with the reported history of appendectomy. No gross colonic mass. No colonic  wall thickening. Vascular/Lymphatic: There is moderate atherosclerotic calcification of the abdominal aorta without aneurysm. There is no gastrohepatic or hepatoduodenal ligament lymphadenopathy. No retroperitoneal or mesenteric lymphadenopathy. No pelvic sidewall lymphadenopathy. Reproductive: Hysterectomy.  There is no adnexal mass. Other: No intraperitoneal free fluid. Musculoskeletal: No worrisome lytic or sclerotic osseous abnormality. Stable superior endplate compression deformity at L1 since 11/21/2023  no evidence for thoracolumbar spine fracture. No evidence for an acute fracture of the bony pelvis. IMPRESSION: 1. No acute findings in the chest, abdomen, or pelvis. No free fluid in the peritoneal cavity. Specifically, no findings to explain the patient's history of dizziness and right shoulder pain. 2. Stable tiny bilateral pulmonary nodules. No follow-up needed if patient is low-risk (and has no known or suspected primary neoplasm). Non-contrast chest CT can be considered in 12 months if patient is high-risk. This recommendation follows the consensus statement: Guidelines for Management of Incidental Pulmonary Nodules Detected on CT Images: From the Fleischner Society 2017; Radiology 2017; 284:228-243. 3. Stable superior endplate compression deformity at L1 since 11/21/2023. 4. Chronic fracture nonunion of the sternum. 5.  Aortic Atherosclerosis (ICD10-I70.0). Electronically Signed   By: Camellia Candle M.D.   On: 01/24/2024 10:03   DG Chest Portable 1 View Result Date: 01/24/2024 CLINICAL DATA:  Fall. EXAM: PORTABLE CHEST 1 VIEW COMPARISON:  Chest radiograph dated 11/21/2023. FINDINGS: No focal consolidation, pleural effusion or pneumothorax. The cardiac silhouette is within normal limits. No acute osseous pathology. Bilateral shoulder arthroplasties. IMPRESSION: No active disease. Electronically Signed   By: Vanetta Chou M.D.   On: 01/24/2024 09:54   DG Pelvis Portable Result Date:  01/24/2024 CLINICAL DATA:  Fall. EXAM: PORTABLE PELVIS 1-2 VIEWS COMPARISON:  Abdomen and pelvis CT 11/21/2023. FINDINGS: There is no evidence of pelvic fracture or diastasis. No pelvic bone lesions are seen. IMPRESSION: Negative. Electronically Signed   By: Camellia Candle M.D.   On: 01/24/2024 09:54   CT Cervical Spine Wo Contrast Result Date: 01/24/2024 EXAM: CT CERVICAL SPINE WITHOUT CONTRAST 01/24/2024 09:32:55 AM TECHNIQUE: CT of the cervical spine was performed without the administration of intravenous contrast. Multiplanar reformatted images are provided for review. Automated exposure control, iterative reconstruction, and/or weight based adjustment of the mA/kV was utilized to reduce the radiation dose to as low as reasonably achievable. COMPARISON: CT of the cervical spine 09/18/2023. MRI of the cervical spine 10/25/2023. CLINICAL HISTORY: Neck trauma (Age >= 65y). CT Head Wo Contrast; Head trauma, minor (Age >= 65y); CT Cervical Spine Wo Contrast; Neck trauma (Age >= 65y); CT CHEST ABDOMEN PELVIS W CONTRAST; Polytrauma, blunt. FINDINGS: CERVICAL SPINE: BONES AND ALIGNMENT: Straightening of the normal cervical lordosis is similar to the prior study. Degenerative grade 1 anterolisthesis at C4-5, C7-T1 and T2-T3 is stable. DEGENERATIVE CHANGES: Chronic endplate degenerative changes are again noted at C5-6, C6-7, T1-2 and T2-3. Severe foraminal stenosis is again noted bilaterally at C3-4. Moderate left foraminal stenosis is stable at C5-6. SOFT TISSUES: No prevertebral soft tissue swelling. VASCULATURE: Atherosclerosis is present at the carotid bifurcation without definite stenosis. IMPRESSION: 1. No acute abnormality of the cervical spine related to the reported neck trauma. 2. Straightening of the normal cervical lordosis, similar to the prior study. 3. Degenerative grade 1 anterolisthesis at C4-5, C7-T1, and T2-T3, stable. 4. Chronic endplate degenerative changes at C5-6, C6-7, T1-2, and T2-3. 5. Severe  foraminal stenosis bilaterally at C3-4 and moderate left foraminal stenosis at C5-6, stable. Electronically signed by: Lonni Necessary MD 01/24/2024 09:41 AM EDT RP Workstation: [id number]   CT Head Wo Contrast Result Date: 01/24/2024 EXAM: CT HEAD WITHOUT CONTRAST 01/24/2024 09:32:55 AM TECHNIQUE: CT of the head was performed without the administration of intravenous contrast. Automated exposure control, iterative reconstruction, and/or weight based adjustment of the mA/kV was utilized to reduce the radiation dose to as low as reasonably achievable. COMPARISON: CT head without contrast 09/18/2023. CLINICAL HISTORY: Head trauma,  minor (Age >= 65y). Polytrauma, blunt. FINDINGS: BRAIN AND VENTRICLES: No acute hemorrhage. Gray-white differentiation is preserved. No hydrocephalus. No extra-axial collection. No mass effect or midline shift. Moderate periventricular and subcortical white matter hypoattenuation is noted bilaterally without significant interval change. ORBITS: Bilateral lens replacements are noted. The globes and orbits are otherwise within normal limits. SINUSES: No acute abnormality. SOFT TISSUES AND SKULL: No acute soft tissue abnormality. No skull fracture. VASCULATURE: Atherosclerotic calcifications are present in the cavernous carotid arteries bilaterally. No hyperdense vessel is present. IMPRESSION: 1. No acute intracranial abnormality related to the minor head trauma. 2. Moderate periventricular and subcortical white matter hypoattenuation bilaterally without significant interval change. Electronically signed by: Lonni Necessary MD 01/24/2024 09:37 AM EDT RP Workstation: [id number]     Procedures   Medications Ordered in the ED  iohexol  (OMNIPAQUE ) 350 MG/ML injection 75 mL (75 mLs Intravenous Contrast Given 01/24/24 0933)  potassium chloride  SA (KLOR-CON  M) CR tablet 40 mEq (40 mEq Oral Given 01/24/24 1015)  sodium chloride  0.9 % bolus 1,000 mL (0 mLs Intravenous Stopped 01/24/24  1136)    Clinical Course as of 01/24/24 1210  Sun Jan 24, 2024  1000 Hemoglobin(!): 7.9 Appears to have chronic anemia, close to baseline [TY]    Clinical Course User Index [TY] Neysa Caron PARAS, DO                                 Medical Decision Making This is a 83 year old female presented as a trauma activation fall thinners.  She is afebrile nontachycardic, hemodynamically stable.  EMS reported stable vital signs and route.  Evaluated in standard ATLS fashion on arrival.  No obvious injuries on physical exam.  Per chart review is on Eliquis .  Patient does not appear to be in acute distress.  Given mechanism CT scan of head and neck.  She did have some minor tenderness to her abdomen.  Therefore, included chest abdomen pelvis CT scan as well.  No acute findings on imaging.  Chest x-ray without pneumonia pneumothorax.  Pelvis x-ray without fracture.  Labs largely reassuring, some minor dehydration hypokalemia.  Repleted.  Minor elevation in creatinine compared to baseline.  Appears to have anemia that is close to baseline, but has trended down over the past week or two  Patient's daughter present at bedside to provide further history and stated that mother has been increasingly weak over the same duration and her hemoglobin is dropped and has been having bright red blood when having bowel movements.  She is on Eliquis .  Discussed case with gastroenterology who will see patient in consult.  Will admit to medicine for further workup.  Amount and/or Complexity of Data Reviewed External Data Reviewed: labs and notes.    Details: On Eliquis .  Downtrending hemoglobin Labs: ordered. Decision-making details documented in ED Course. Radiology: ordered and independent interpretation performed.    Details: No obvious intracranial hemorrhage or pathology Discussion of management or test interpretation with external provider(s): GI.  Family medicine  Risk Prescription drug management. Decision  regarding hospitalization. Diagnosis or treatment significantly limited by social determinants of health. Risk Details: Dementia       Final diagnoses:  Fall, initial encounter    ED Discharge Orders     None          Neysa Caron PARAS, DO 01/24/24 1131    Neysa Caron J, DO 01/24/24 1210

## 2024-01-24 NOTE — Discharge Instructions (Signed)
 Thank you for allowing us  to be part of your care. You were hospitalized for an episode of  fall. We also treat you for anemia and rectal bleeding.  See the changes in your medications and management of your chronic conditions below:  *For your  -We have STARTED you on these following medications:  - Metoprolol  25 mg  - Vitamin C  1000 mg tablet  -.Ferrous sulfate  325 mg tablet  -Hydrocortisone  25 mg suppository              -We have STOPPED the following medications: Eliquis  (please speak with your cardiologist and PCP about resuming the medication)   FOLLOW UP APPOINTMENTS: We arranged for you to follow up with your family doctor: Please visit Loris Elsie PARAS, PA-C in 7 days You should also be contacted to arrange home health physical therapy.  Please make sure to  drink good amount of water  high-fiber food Use your bedside commode visit your PCP in 1 week for speaking about your medication  follow-up with your cardiologist about your Eliquis  and metoprolol .  Please call your PCP or our clinic if you have any questions or concerns, we may be able to help and keep you from a long and expensive emergency room wait. Our clinic and after hours phone number is [phone]. The best time to call is Monday through Friday 9 am to 4 pm but there is always someone available 24/7 if you have an emergency. If you need medication refills please notify your pharmacy one week in advance and they will send us  a request.   We are glad you are feeling better,  [facility] Inpatient Teaching Service at [facility]

## 2024-01-24 NOTE — ED Triage Notes (Signed)
 [facility] reports pt coming from home. Pt reports she fell out of bed this morning and hit the back of her head. Denies any LOC, pt is on Eliquis . Pt reports dizziness after the fall, general weakness, also reports she had a fall earlier this week as well. Pt c/o head pain, neck pain and right shoulder pain.

## 2024-01-24 NOTE — Consult Note (Signed)
 [facility] Gastroenterology Consult  Referring Provider: ER Primary Care Physician:  Loris Elsie PARAS, PA-C Primary Gastroenterologist: Dr. Rosalie  Reason for Consultation: Rectal bleeding  HPI: Brenda Stephens is a 83 y.o. female presents to the ER after she lost her balance and fell to the ground hitting her head, shoulder and upper back without associated loss of consciousness or vertigo.  Patient was recently seen in the office on 01/14/2024 by NP, Jacqueline Thigpen, for rectal bleeding, thought to be related to external hemorrhoids, hemoglobin in office was 10.4.  She was given glycerin suppositories, hemorrhoidal ointment and may be Anusol  suppositories but none of them have helped.  She complains of bright red blood in undergarment on wiping and sometimes in the toilet bowl, ongoing for the last 4 months.  Patient also states for the last 2 weeks she has difficulty swallowing, more with solids than liquids, nonprogressive and not associated with weight loss.  She lives at home with her daughter.  Previous GI workup: Upper GI series: Proximal cervical web, hiatal hernia, duodenal diverticulum  EGD 03/2019, epigastric pain, abnormal upper GI series: Moderate hiatal hernia, duodenal diverticulum, gastritis  Gastric emptying scan 2016: Delayed gastric emptying  Colonoscopy 08/2012, Dr. Rosalie, generalized abdominal pain, hematochezia: Internal and external hemorrhoids, diffuse melanosis, otherwise unremarkable, normal TI   Past Medical History:  Diagnosis Date   Acute deep vein thrombosis (DVT) of proximal vein of right lower extremity (HCC) 07/02/2022   Acute pulmonary embolism (HCC) 07/15/2022   AKI (acute kidney injury) (HCC) 02/16/2023   Allergy    latex   Anemia    Anxiety    Arthritis    Atrial fibrillation with RVR (HCC) 02/12/2023   Bilateral pulmonary embolism (HCC) 06/30/2022   Cancer (HCC) 01/2017   right breast cancer   Depression    Dyslipidemia    Eustachian tube  dysfunction    GERD (gastroesophageal reflux disease)    Grade I diastolic dysfunction 07/15/2022   Hx of cardiovascular stress test    Myoview  10/13:  apical thinning, EF 72%, no ischemia   Hx of colonic polyps    Dr Rosalie   Hypertension    Hypothyroidism    Other abnormal glucose    Pulmonary embolism (HCC) 06/30/2022    Past Surgical History:  Procedure Laterality Date   ABDOMINAL HYSTERECTOMY     BSO for uterine fibroids   ACHILLES TENDON SURGERY Right 10/07/2012   Procedure: RIGHT REPAIR RUPTURE TENDON PRIMARY OPEN/PERCUTANEOUS ;  Surgeon: Toribio JULIANNA Chancy, MD;  Location: [facility];  Service: Orthopedics;  Laterality: Right;   APPENDECTOMY     BREAST LUMPECTOMY WITH RADIOACTIVE SEED AND SENTINEL LYMPH NODE BIOPSY Right 03/16/2017   Procedure: RIGHT BREAST LUMPECTOMY WITH RADIOACTIVE SEED AND RIGHT AXILLARY SENTINEL  NODE BIOPSY ERAS PATHWAY;  Surgeon: Ebbie Cough, MD;  Location: [facility];  Service: General;  Laterality: Right;  PECTORAL BLOCK   CATARACT EXTRACTION, BILATERAL     CHOLECYSTECTOMY     COLONOSCOPY W/ POLYPECTOMY     Dr Rosalie   EAR CYST EXCISION Right 10/07/2012   Procedure: 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 ;  Surgeon: Toribio JULIANNA Chancy, MD;  Location: [facility];  Service: Orthopedics;  Laterality: Right;   EXPLORATORY LAPAROTOMY     LEFT HEART CATH AND CORONARY ANGIOGRAPHY N/A 08/02/2019   Procedure: LEFT HEART CATH AND CORONARY ANGIOGRAPHY;  Surgeon: [place], Peter M, MD;  Location: [facility] INVASIVE CV LAB;  Service: Cardiovascular;  Laterality:  N/A;   REVISION TOTAL SHOULDER TO REVERSE TOTAL SHOULDER Left 01/12/2019   Procedure: REVISION TOTAL SHOULDER TO REVERSE TOTAL SHOULDER;  Surgeon: Cristy Bonner DASEN, MD;  Location: [facility];  Service: Orthopedics;  Laterality: Left;   SINUS SURGERY WITH INSTATRAK     TOTAL SHOULDER REPLACEMENT     Right shoulder   TOTAL SHOULDER REPLACEMENT  05/2010   Left   TUBAL  LIGATION      Prior to Admission medications   Medication Sig Start Date End Date Taking? Authorizing Provider  acetaminophen  (TYLENOL ) 650 MG CR tablet Take 1 tablet (650 mg total) by mouth every 8 (eight) hours as needed for pain (headaches). Patient taking differently: Take 1,300 mg by mouth 2 (two) times daily. 07/05/22  Yes Sebastian Toribio GAILS, MD  albuterol  (VENTOLIN  HFA) 108 (90 Base) MCG/ACT inhaler TAKE 2 PUFFS BY MOUTH EVERY 6 HOURS AS NEEDED FOR WHEEZE OR SHORTNESS OF BREATH 11/28/23  Yes Young, Clinton D, MD  apixaban  (ELIQUIS ) 5 MG TABS tablet Take 1 tablet (5 mg total) by mouth 2 (two) times daily. 11/26/23  Yes Lavona Agent, MD  benzonatate  (TESSALON ) 200 MG capsule TAKE 1 CAPSULE (200 MG TOTAL) BY MOUTH 3 (THREE) TIMES DAILY AS NEEDED FOR COUGH. 06/15/23  Yes Young, Clinton D, MD  Biotin  10000 MCG TABS Take 1,000 mcg by mouth daily.   Yes [provider]  cetirizine (ZYRTEC) 10 MG tablet Take 10 mg by mouth at bedtime.   Yes [provider]  dicyclomine  (BENTYL ) 20 MG tablet Take 20 mg by mouth every 6 (six) hours as needed for spasms. 09/15/22  Yes [provider]  docusate sodium  (COLACE) 250 MG capsule Take 250 mg by mouth at bedtime.   Yes [provider]  donepezil  (ARICEPT ) 23 MG TABS tablet Take 23 mg by mouth daily. 08/20/23  Yes [provider]  DULoxetine  (CYMBALTA ) 60 MG capsule Take 2 capsules (120 mg total) by mouth daily. Patient taking differently: Take 60 mg by mouth daily. 11/16/23  Yes   furosemide  (LASIX ) 20 MG tablet Take 1 tablet (20 mg total) by mouth daily. Patient taking differently: Take 20 mg by mouth in the morning. 11/23/23  Yes Odell Celinda Balo, MD  hydrOXYzine  (ATARAX ) 25 MG tablet Take 1 tablet (25 mg total) by mouth every 6 (six) hours as needed for anxiety. Patient taking differently: Take 50 mg by mouth at bedtime. 10/19/23  Yes   levothyroxine  (SYNTHROID ) 25 MCG tablet Take 12.5 mcg by mouth daily before  breakfast.   Yes [provider]  memantine  (NAMENDA ) 10 MG tablet Take 1 tablet (10 mg total) by mouth 2 (two) times daily. 10/05/23  Yes   methocarbamol (ROBAXIN) 500 MG tablet Take 500 mg by mouth 2 (two) times daily. 07/30/23  Yes [provider]  metoprolol  succinate (TOPROL  XL) 50 MG 24 hr tablet Take 1 tablet (50 mg total) by mouth at bedtime. 11/26/23  Yes Lavona Agent, MD  Multiple Vitamins-Minerals (CENTRUM SILVER) CHEW Chew 1 tablet by mouth daily.   Yes [provider]  nitroGLYCERIN  (NITROSTAT ) 0.4 MG SL tablet Place 0.4 mg under the tongue as needed.   Yes [provider]  ondansetron  (ZOFRAN -ODT) 4 MG disintegrating tablet Take 4 mg by mouth every 8 (eight) hours as needed for nausea or vomiting.   Yes [provider]  oxybutynin  (DITROPAN ) 5 MG tablet Take 5 mg by mouth 2 (two) times daily.   Yes [provider]  Polyethyl Glycol-Propyl Glycol (  SYSTANE OP) Place 1 drop into both eyes 2 (two) times daily as needed (for dryness).   Yes [provider]  potassium chloride  SA (KLOR-CON  M) 20 MEQ tablet Take 1 tablet (20 mEq total) by mouth daily. Patient taking differently: Take 20 mEq by mouth at bedtime. 11/26/23  Yes Odell Celinda Balo, MD  Probiotic Product (PROBIOTIC PO) Take 1 tablet by mouth daily.   Yes [provider]  Simethicone (GAS-X EXTRA STRENGTH PO) Take 1 tablet by mouth daily as needed.   Yes [provider]  tamsulosin  (FLOMAX ) 0.4 MG CAPS capsule Take 0.4 mg by mouth at bedtime.   Yes [provider]  traMADol  (ULTRAM ) 50 MG tablet Take 50 mg by mouth 2 (two) times daily as needed.   Yes [provider]  valsartan  (DIOVAN ) 80 MG tablet TAKE 1 TABLET BY MOUTH EVERY DAY Patient taking differently: Take 40 mg by mouth daily. 09/10/23  Yes Lavona Agent, MD    No current facility-administered medications for this encounter.   Current Outpatient Medications  Medication  Sig Dispense Refill   acetaminophen  (TYLENOL ) 650 MG CR tablet Take 1 tablet (650 mg total) by mouth every 8 (eight) hours as needed for pain (headaches). (Patient taking differently: Take 1,300 mg by mouth 2 (two) times daily.)     albuterol  (VENTOLIN  HFA) 108 (90 Base) MCG/ACT inhaler TAKE 2 PUFFS BY MOUTH EVERY 6 HOURS AS NEEDED FOR WHEEZE OR SHORTNESS OF BREATH 18 each 0   apixaban  (ELIQUIS ) 5 MG TABS tablet Take 1 tablet (5 mg total) by mouth 2 (two) times daily. 60 tablet 5   benzonatate  (TESSALON ) 200 MG capsule TAKE 1 CAPSULE (200 MG TOTAL) BY MOUTH 3 (THREE) TIMES DAILY AS NEEDED FOR COUGH. 30 capsule 1   Biotin  10000 MCG TABS Take 1,000 mcg by mouth daily.     cetirizine (ZYRTEC) 10 MG tablet Take 10 mg by mouth at bedtime.     dicyclomine  (BENTYL ) 20 MG tablet Take 20 mg by mouth every 6 (six) hours as needed for spasms.     docusate sodium  (COLACE) 250 MG capsule Take 250 mg by mouth at bedtime.     donepezil  (ARICEPT ) 23 MG TABS tablet Take 23 mg by mouth daily.     DULoxetine  (CYMBALTA ) 60 MG capsule Take 2 capsules (120 mg total) by mouth daily. (Patient taking differently: Take 60 mg by mouth daily.) 180 capsule 1   furosemide  (LASIX ) 20 MG tablet Take 1 tablet (20 mg total) by mouth daily. (Patient taking differently: Take 20 mg by mouth in the morning.) 90 tablet 3   hydrOXYzine  (ATARAX ) 25 MG tablet Take 1 tablet (25 mg total) by mouth every 6 (six) hours as needed for anxiety. (Patient taking differently: Take 50 mg by mouth at bedtime.) 90 tablet 1   levothyroxine  (SYNTHROID ) 25 MCG tablet Take 12.5 mcg by mouth daily before breakfast.     memantine  (NAMENDA ) 10 MG tablet Take 1 tablet (10 mg total) by mouth 2 (two) times daily. 180 tablet 3   methocarbamol (ROBAXIN) 500 MG tablet Take 500 mg by mouth 2 (two) times daily.     metoprolol  succinate (TOPROL  XL) 50 MG 24 hr tablet Take 1 tablet (50 mg total) by mouth at bedtime. 90 tablet 3   Multiple Vitamins-Minerals (CENTRUM  SILVER) CHEW Chew 1 tablet by mouth daily.     nitroGLYCERIN  (NITROSTAT ) 0.4 MG SL tablet Place 0.4 mg under the tongue as needed.     ondansetron  (  ZOFRAN -ODT) 4 MG disintegrating tablet Take 4 mg by mouth every 8 (eight) hours as needed for nausea or vomiting.     oxybutynin  (DITROPAN ) 5 MG tablet Take 5 mg by mouth 2 (two) times daily.     Polyethyl Glycol-Propyl Glycol (SYSTANE OP) Place 1 drop into both eyes 2 (two) times daily as needed (for dryness).     potassium chloride  SA (KLOR-CON  M) 20 MEQ tablet Take 1 tablet (20 mEq total) by mouth daily. (Patient taking differently: Take 20 mEq by mouth at bedtime.) 90 tablet 3   Probiotic Product (PROBIOTIC PO) Take 1 tablet by mouth daily.     Simethicone (GAS-X EXTRA STRENGTH PO) Take 1 tablet by mouth daily as needed.     tamsulosin  (FLOMAX ) 0.4 MG CAPS capsule Take 0.4 mg by mouth at bedtime.     traMADol  (ULTRAM ) 50 MG tablet Take 50 mg by mouth 2 (two) times daily as needed.     valsartan  (DIOVAN ) 80 MG tablet TAKE 1 TABLET BY MOUTH EVERY DAY (Patient taking differently: Take 40 mg by mouth daily.) 90 tablet 2    Allergies as of 01/24/2024 - Review Complete 01/24/2024  Allergen Reaction Noted   Latex Rash 03/31/2014   Sulfa  antibiotics Anaphylaxis and Nausea Only 01/22/2017   Aspirin  Other (See Comments) 03/31/2014   Nsaids Other (See Comments) 06/30/2022   Oxycodone -acetaminophen  Nausea Only 03/31/2014    Family History  Problem Relation Age of Onset   Hiatal hernia Mother    Heart attack Mother 65   Heart disease Father 59       CAD @ autopsy   Cancer Father        renal cancer   Heart attack Brother 29       fatal   Diabetes Neg Hx    Stroke Neg Hx     Social History   Socioeconomic History   Marital status: Widowed    Spouse name: Not on file   Number of children: 1   Years of education: Not on file   Highest education level: Not on file  Occupational History   Not on file  Tobacco Use   Smoking status: Former     Current packs/day: 0.00    Average packs/day: 0.5 packs/day for 6.0 years (3.0 ttl pk-yrs)    Types: Cigarettes    Start date: 06/30/1956    Quit date: 06/30/1962    Years since quitting: 61.6    Passive exposure: Never   Smokeless tobacco: Never   Tobacco comments:    Quit age 65  Vaping Use   Vaping status: Never Used  Substance and Sexual Activity   Alcohol  use: Yes    Alcohol /week: 3.0 standard drinks of alcohol     Types: 3 Glasses of wine per week    Comment: wine  : < 3 glasses weekly   Drug use: No   Sexual activity: Yes    Birth control/protection: Post-menopausal, Surgical  Other Topics Concern   Not on file  Social History Narrative   Lives husband and daughter lives with her.    Social Drivers of Health   Financial Resource Strain: Low Risk  (03/12/2021)   Received from [facility] visits prior to 08/30/2022.   Overall Financial Resource Strain (CARDIA)    Difficulty of Paying Living Expenses: Not very hard  Food Insecurity: No Food Insecurity (07/09/2023)   Hunger Vital Sign    Worried About Running Out of Food in the Last Year: Never  true    Ran Out of Food in the Last Year: Never true  Transportation Needs: No Transportation Needs (07/09/2023)   PRAPARE - Administrator, Civil Service (Medical): No    Lack of Transportation (Non-Medical): No  Physical Activity: Inactive (03/12/2021)   Received from [facility] visits prior to 08/30/2022.   Exercise Vital Sign    On average, how many days per week do you engage in moderate to strenuous exercise (like a brisk walk)?: 0 days    On average, how many minutes do you engage in exercise at this level?: 0 min  Stress: Stress Concern Present (03/12/2021)   Received from [facility] visits prior to 08/30/2022.   [employer] of Occupational Health - Occupational Stress Questionnaire    Feeling of Stress : Rather much  Social Connections: Unknown  (07/09/2023)   Social Connection and Isolation Panel    Frequency of Communication with Friends and Family: More than three times a week    Frequency of Social Gatherings with Friends and Family: More than three times a week    Attends Religious Services: More than 4 times per year    Active Member of [employer] or Organizations: Patient declined    Attends Banker Meetings: Patient declined    Marital Status: Patient declined  Intimate Partner Violence: Not At Risk (07/09/2023)   Humiliation, Afraid, Rape, and Kick questionnaire    Fear of Current or Ex-Partner: No    Emotionally Abused: No    Physically Abused: No    Sexually Abused: No    Review of Systems: As per HPI.  Physical Exam: Vital signs in last 24 hours: Temp:  [98.6 F (37 C)] 98.6 F (37 C) (07/27 0851) Pulse Rate:  [59-68] 59 (07/27 1145) Resp:  [15-20] 18 (07/27 1145) BP: (118-141)/(50-80) 129/50 (07/27 1145) SpO2:  [99 %-100 %] 100 % (07/27 1145)    General:   Alert,  Well-developed, well-nourished, pleasant and cooperative in NAD Head:  Normocephalic and atraumatic. Eyes:  Sclera clear, no icterus.  Mild pallor Ears:  Normal auditory acuity. Nose:  No deformity, discharge,  or lesions. Mouth:  No deformity or lesions.  Oropharynx pink & moist. Neck:  Supple; no masses or thyromegaly. Lungs:  Clear throughout to auscultation.   No wheezes, crackles, or rhonchi. No acute distress. Heart:  Regular rate and rhythm; no murmurs, clicks, rubs,  or gallops. Extremities:  Without clubbing or edema. Neurologic:  Alert and  oriented x4;  grossly normal neurologically. Skin:  Intact without significant lesions or rashes. Psych:  Alert and cooperative. Normal mood and affect. Abdomen:  Soft, nontender and nondistended. No masses, hepatosplenomegaly or hernias noted. Normal bowel sounds, without guarding, and without rebound.         Lab Results: Recent Labs    01/24/24 0854 01/24/24 0916  WBC 7.9  --    HGB 7.9* 8.8*  HCT 24.5* 26.0*  PLT 374  --    BMET Recent Labs    01/24/24 0854 01/24/24 0916  NA 134* 136  K 3.2* 3.7  CL 104 105  CO2 17*  --   GLUCOSE 125* 119*  BUN 21 26*  CREATININE 1.38* 1.40*  CALCIUM  9.3  --    LFT Recent Labs    01/24/24 0854  PROT 6.6  ALBUMIN 3.4*  AST 25  ALT 17  ALKPHOS 53  BILITOT 0.6   PT/INR No results for input(s): LABPROT, INR  in the last 72 hours.  Studies/Results: CT CHEST ABDOMEN PELVIS W CONTRAST Result Date: 01/24/2024 CLINICAL DATA:  Patient fell out of bed this morning and hit the back of her head. Dizziness. Right shoulder pain. EXAM: CT CHEST, ABDOMEN, AND PELVIS WITH CONTRAST TECHNIQUE: Multidetector CT imaging of the chest, abdomen and pelvis was performed following the standard protocol during bolus administration of intravenous contrast. RADIATION DOSE REDUCTION: This exam was performed according to the departmental dose-optimization program which includes automated exposure control, adjustment of the mA and/or kV according to patient size and/or use of iterative reconstruction technique. CONTRAST:  75mL OMNIPAQUE  IOHEXOL  350 MG/ML SOLN COMPARISON:  11/21/2023 FINDINGS: CT CHEST FINDINGS Cardiovascular: Heart size mildly increased. No substantial pericardial effusion. Mild atherosclerotic calcification is noted in the wall of the thoracic aorta. No large central pulmonary embolus. Mediastinum/Nodes: No mediastinal lymphadenopathy. There is no hilar lymphadenopathy. The esophagus has normal imaging features. There is no axillary lymphadenopathy. Portions of the axilla in supraclavicular regions obscured by beam hardening artifact from bilateral shoulder replacement. Lungs/Pleura: 4 mm right lower lobe nodule seen on 92/6. 4 mm left lower lobe nodule visible on 77/6. 2 mm left upper lobe nodule identified on 56/6. Calcified granuloma right lower lobe on 85/6. Tiny lower lobe pulmonary nodules are stable since prior. No focal  airspace consolidation. There is no evidence of pleural effusion. Musculoskeletal: Bilateral shoulder replacement. Chronic fracture nonunion of the sternum. No worrisome lytic or sclerotic osseous abnormality. CT ABDOMEN PELVIS FINDINGS Hepatobiliary:. A tiny hypodensity in the liver parenchyma is too small to characterize but is statistically most likely benign. No followup imaging is recommended. Gallbladder is surgically absent. No intrahepatic or extrahepatic biliary dilation. Pancreas: Diffuse parenchymal atrophy without main duct dilatation. Spleen: No splenomegaly. No suspicious focal mass lesion. Adrenals/Urinary Tract: No adrenal nodule or mass. Tiny well-defined homogeneous low-density lesions in both kidneys are too small to characterize but are statistically most likely benign and probably cysts. No followup imaging is recommended. No evidence for hydroureter. The urinary bladder appears normal for the degree of distention. Stomach/Bowel: Stomach is unremarkable. No gastric wall thickening. No evidence of outlet obstruction. Duodenum is normally positioned as is the ligament of Treitz. No small bowel wall thickening. No small bowel dilatation. The terminal ileum is normal. Nonvisualization of the appendix is consistent with the reported history of appendectomy. No gross colonic mass. No colonic wall thickening. Vascular/Lymphatic: There is moderate atherosclerotic calcification of the abdominal aorta without aneurysm. There is no gastrohepatic or hepatoduodenal ligament lymphadenopathy. No retroperitoneal or mesenteric lymphadenopathy. No pelvic sidewall lymphadenopathy. Reproductive: Hysterectomy.  There is no adnexal mass. Other: No intraperitoneal free fluid. Musculoskeletal: No worrisome lytic or sclerotic osseous abnormality. Stable superior endplate compression deformity at L1 since 11/21/2023 no evidence for thoracolumbar spine fracture. No evidence for an acute fracture of the bony pelvis.  IMPRESSION: 1. No acute findings in the chest, abdomen, or pelvis. No free fluid in the peritoneal cavity. Specifically, no findings to explain the patient's history of dizziness and right shoulder pain. 2. Stable tiny bilateral pulmonary nodules. No follow-up needed if patient is low-risk (and has no known or suspected primary neoplasm). Non-contrast chest CT can be considered in 12 months if patient is high-risk. This recommendation follows the consensus statement: Guidelines for Management of Incidental Pulmonary Nodules Detected on CT Images: From the Fleischner Society 2017; Radiology 2017; 284:228-243. 3. Stable superior endplate compression deformity at L1 since 11/21/2023. 4. Chronic fracture nonunion of the sternum. 5.  Aortic Atherosclerosis (ICD10-I70.0). Electronically  Signed   By: Camellia Candle M.D.   On: 01/24/2024 10:03   DG Chest Portable 1 View Result Date: 01/24/2024 CLINICAL DATA:  Fall. EXAM: PORTABLE CHEST 1 VIEW COMPARISON:  Chest radiograph dated 11/21/2023. FINDINGS: No focal consolidation, pleural effusion or pneumothorax. The cardiac silhouette is within normal limits. No acute osseous pathology. Bilateral shoulder arthroplasties. IMPRESSION: No active disease. Electronically Signed   By: Vanetta Chou M.D.   On: 01/24/2024 09:54   DG Pelvis Portable Result Date: 01/24/2024 CLINICAL DATA:  Fall. EXAM: PORTABLE PELVIS 1-2 VIEWS COMPARISON:  Abdomen and pelvis CT 11/21/2023. FINDINGS: There is no evidence of pelvic fracture or diastasis. No pelvic bone lesions are seen. IMPRESSION: Negative. Electronically Signed   By: Camellia Candle M.D.   On: 01/24/2024 09:54   CT Cervical Spine Wo Contrast Result Date: 01/24/2024 EXAM: CT CERVICAL SPINE WITHOUT CONTRAST 01/24/2024 09:32:55 AM TECHNIQUE: CT of the cervical spine was performed without the administration of intravenous contrast. Multiplanar reformatted images are provided for review. Automated exposure control, iterative  reconstruction, and/or weight based adjustment of the mA/kV was utilized to reduce the radiation dose to as low as reasonably achievable. COMPARISON: CT of the cervical spine 09/18/2023. MRI of the cervical spine 10/25/2023. CLINICAL HISTORY: Neck trauma (Age >= 65y). CT Head Wo Contrast; Head trauma, minor (Age >= 65y); CT Cervical Spine Wo Contrast; Neck trauma (Age >= 65y); CT CHEST ABDOMEN PELVIS W CONTRAST; Polytrauma, blunt. FINDINGS: CERVICAL SPINE: BONES AND ALIGNMENT: Straightening of the normal cervical lordosis is similar to the prior study. Degenerative grade 1 anterolisthesis at C4-5, C7-T1 and T2-T3 is stable. DEGENERATIVE CHANGES: Chronic endplate degenerative changes are again noted at C5-6, C6-7, T1-2 and T2-3. Severe foraminal stenosis is again noted bilaterally at C3-4. Moderate left foraminal stenosis is stable at C5-6. SOFT TISSUES: No prevertebral soft tissue swelling. VASCULATURE: Atherosclerosis is present at the carotid bifurcation without definite stenosis. IMPRESSION: 1. No acute abnormality of the cervical spine related to the reported neck trauma. 2. Straightening of the normal cervical lordosis, similar to the prior study. 3. Degenerative grade 1 anterolisthesis at C4-5, C7-T1, and T2-T3, stable. 4. Chronic endplate degenerative changes at C5-6, C6-7, T1-2, and T2-3. 5. Severe foraminal stenosis bilaterally at C3-4 and moderate left foraminal stenosis at C5-6, stable. Electronically signed by: Lonni Necessary MD 01/24/2024 09:41 AM EDT RP Workstation: [id number]   CT Head Wo Contrast Result Date: 01/24/2024 EXAM: CT HEAD WITHOUT CONTRAST 01/24/2024 09:32:55 AM TECHNIQUE: CT of the head was performed without the administration of intravenous contrast. Automated exposure control, iterative reconstruction, and/or weight based adjustment of the mA/kV was utilized to reduce the radiation dose to as low as reasonably achievable. COMPARISON: CT head without contrast 09/18/2023. CLINICAL  HISTORY: Head trauma, minor (Age >= 65y). Polytrauma, blunt. FINDINGS: BRAIN AND VENTRICLES: No acute hemorrhage. Gray-white differentiation is preserved. No hydrocephalus. No extra-axial collection. No mass effect or midline shift. Moderate periventricular and subcortical white matter hypoattenuation is noted bilaterally without significant interval change. ORBITS: Bilateral lens replacements are noted. The globes and orbits are otherwise within normal limits. SINUSES: No acute abnormality. SOFT TISSUES AND SKULL: No acute soft tissue abnormality. No skull fracture. VASCULATURE: Atherosclerotic calcifications are present in the cavernous carotid arteries bilaterally. No hyperdense vessel is present. IMPRESSION: 1. No acute intracranial abnormality related to the minor head trauma. 2. Moderate periventricular and subcortical white matter hypoattenuation bilaterally without significant interval change. Electronically signed by: Lonni Necessary MD 01/24/2024 09:37 AM EDT RP Workstation: [id number]  Impression: Rectal bleeding, hemoglobin 7.9, subsequent 8.8, hemoglobin was 9.7 on 01/14/2024 Slightly elevated BUN of 26 with a creatinine of 1.4 and GFR of 38 Rectal bleeding, thought to be related to hemorrhoids, intermittent, ongoing, no improvement with suppositories and hemorrhoidal ointments, daughter requesting for colonoscopy while she is in the hospital.  Dysphagia, for the last 2 weeks, nonprogressive, not associated with weight loss, only to solids  CT: No acute findings in chest, abdomen, pelvis, tiny bilateral pulmonary nodules, stable superior endplate compression deformity at L1 Unremarkable stomach, no bowel obstruction, no small bowel wall thickening, no gross colonic lesion or colonic wall thickening  Was on Eliquis , last dose on Saturday, 01/23/2024  Plan: Diagnostic colonoscopy in a.m. tomorrow along with EGD with possible dilatation with Dr. Kriss.      LOS: 0 days   Estelita Manas, MD  01/24/2024, 12:01 PM

## 2024-01-25 ENCOUNTER — Encounter (HOSPITAL_COMMUNITY): Payer: Self-pay | Admitting: Infectious Diseases

## 2024-01-25 ENCOUNTER — Inpatient Hospital Stay (HOSPITAL_COMMUNITY): Payer: Self-pay | Admitting: Anesthesiology

## 2024-01-25 ENCOUNTER — Encounter (HOSPITAL_COMMUNITY)
Admission: EM | Disposition: A | Discharge status: Home Health | Payer: Self-pay | Source: Home / Self Care | Attending: Infectious Diseases

## 2024-01-25 DIAGNOSIS — R1310 Dysphagia, unspecified: Secondary | ICD-10-CM | POA: Diagnosis not present

## 2024-01-25 DIAGNOSIS — W010XXA Fall on same level from slipping, tripping and stumbling without subsequent striking against object, initial encounter: Secondary | ICD-10-CM | POA: Diagnosis present

## 2024-01-25 DIAGNOSIS — N179 Acute kidney failure, unspecified: Secondary | ICD-10-CM | POA: Diagnosis present

## 2024-01-25 DIAGNOSIS — K641 Second degree hemorrhoids: Secondary | ICD-10-CM

## 2024-01-25 DIAGNOSIS — E8721 Acute metabolic acidosis: Secondary | ICD-10-CM | POA: Diagnosis present

## 2024-01-25 DIAGNOSIS — R55 Syncope and collapse: Secondary | ICD-10-CM | POA: Diagnosis present

## 2024-01-25 DIAGNOSIS — D124 Benign neoplasm of descending colon: Secondary | ICD-10-CM | POA: Diagnosis present

## 2024-01-25 DIAGNOSIS — F0393 Unspecified dementia, unspecified severity, with mood disturbance: Secondary | ICD-10-CM | POA: Diagnosis present

## 2024-01-25 DIAGNOSIS — Z7901 Long term (current) use of anticoagulants: Secondary | ICD-10-CM | POA: Diagnosis not present

## 2024-01-25 DIAGNOSIS — I10 Essential (primary) hypertension: Secondary | ICD-10-CM

## 2024-01-25 DIAGNOSIS — E869 Volume depletion, unspecified: Secondary | ICD-10-CM | POA: Diagnosis present

## 2024-01-25 DIAGNOSIS — W19XXXA Unspecified fall, initial encounter: Secondary | ICD-10-CM

## 2024-01-25 DIAGNOSIS — K649 Unspecified hemorrhoids: Secondary | ICD-10-CM | POA: Diagnosis not present

## 2024-01-25 DIAGNOSIS — K222 Esophageal obstruction: Secondary | ICD-10-CM | POA: Diagnosis present

## 2024-01-25 DIAGNOSIS — I129 Hypertensive chronic kidney disease with stage 1 through stage 4 chronic kidney disease, or unspecified chronic kidney disease: Secondary | ICD-10-CM | POA: Diagnosis present

## 2024-01-25 DIAGNOSIS — Z66 Do not resuscitate: Secondary | ICD-10-CM | POA: Diagnosis present

## 2024-01-25 DIAGNOSIS — E876 Hypokalemia: Secondary | ICD-10-CM | POA: Diagnosis present

## 2024-01-25 DIAGNOSIS — I951 Orthostatic hypotension: Secondary | ICD-10-CM | POA: Diagnosis present

## 2024-01-25 DIAGNOSIS — F0394 Unspecified dementia, unspecified severity, with anxiety: Secondary | ICD-10-CM | POA: Diagnosis present

## 2024-01-25 DIAGNOSIS — K529 Noninfective gastroenteritis and colitis, unspecified: Secondary | ICD-10-CM | POA: Diagnosis present

## 2024-01-25 DIAGNOSIS — D6832 Hemorrhagic disorder due to extrinsic circulating anticoagulants: Secondary | ICD-10-CM | POA: Diagnosis present

## 2024-01-25 DIAGNOSIS — G8929 Other chronic pain: Secondary | ICD-10-CM | POA: Diagnosis present

## 2024-01-25 DIAGNOSIS — D649 Anemia, unspecified: Secondary | ICD-10-CM | POA: Diagnosis not present

## 2024-01-25 DIAGNOSIS — E785 Hyperlipidemia, unspecified: Secondary | ICD-10-CM | POA: Diagnosis present

## 2024-01-25 DIAGNOSIS — D6869 Other thrombophilia: Secondary | ICD-10-CM | POA: Diagnosis present

## 2024-01-25 DIAGNOSIS — F32A Depression, unspecified: Secondary | ICD-10-CM | POA: Diagnosis present

## 2024-01-25 DIAGNOSIS — D500 Iron deficiency anemia secondary to blood loss (chronic): Secondary | ICD-10-CM | POA: Diagnosis present

## 2024-01-25 DIAGNOSIS — S0990XA Unspecified injury of head, initial encounter: Secondary | ICD-10-CM | POA: Diagnosis present

## 2024-01-25 DIAGNOSIS — Y92009 Unspecified place in unspecified non-institutional (private) residence as the place of occurrence of the external cause: Secondary | ICD-10-CM | POA: Diagnosis not present

## 2024-01-25 DIAGNOSIS — I480 Paroxysmal atrial fibrillation: Secondary | ICD-10-CM | POA: Diagnosis present

## 2024-01-25 DIAGNOSIS — E039 Hypothyroidism, unspecified: Secondary | ICD-10-CM | POA: Diagnosis present

## 2024-01-25 DIAGNOSIS — N1831 Chronic kidney disease, stage 3a: Secondary | ICD-10-CM | POA: Diagnosis present

## 2024-01-25 HISTORY — PX: COLONOSCOPY: SHX5424

## 2024-01-25 HISTORY — PX: ESOPHAGOGASTRODUODENOSCOPY: SHX5428

## 2024-01-25 LAB — CBC
HCT: 23.6 % — ABNORMAL LOW (ref 36.0–46.0)
Hemoglobin: 7.6 g/dL — ABNORMAL LOW (ref 12.0–15.0)
MCH: 27 pg (ref 26.0–34.0)
MCHC: 32.2 g/dL (ref 30.0–36.0)
MCV: 83.7 fL (ref 80.0–100.0)
Platelets: 377 K/uL (ref 150–400)
RBC: 2.82 MIL/uL — ABNORMAL LOW (ref 3.87–5.11)
RDW: 13.7 % (ref 11.5–15.5)
WBC: 10.8 K/uL — ABNORMAL HIGH (ref 4.0–10.5)
nRBC: 0 % (ref 0.0–0.2)

## 2024-01-25 LAB — BASIC METABOLIC PANEL WITH GFR
Anion gap: 11 (ref 5–15)
BUN: 14 mg/dL (ref 8–23)
CO2: 17 mmol/L — ABNORMAL LOW (ref 22–32)
Calcium: 9.3 mg/dL (ref 8.9–10.3)
Chloride: 113 mmol/L — ABNORMAL HIGH (ref 98–111)
Creatinine, Ser: 1.15 mg/dL — ABNORMAL HIGH (ref 0.44–1.00)
GFR, Estimated: 48 mL/min — ABNORMAL LOW (ref >60)
Glucose, Bld: 129 mg/dL — ABNORMAL HIGH (ref 70–99)
Potassium: 3.7 mmol/L (ref 3.5–5.1)
Sodium: 141 mmol/L (ref 135–145)

## 2024-01-25 SURGERY — EGD (ESOPHAGOGASTRODUODENOSCOPY)
Anesthesia: Monitor Anesthesia Care

## 2024-01-25 MED ORDER — SODIUM CHLORIDE 0.9 % IV SOLN: INTRAVENOUS | Status: DC

## 2024-01-25 MED ORDER — PROPOFOL 500 MG/50ML IV EMUL
INTRAVENOUS | Status: DC | PRN
  Administered 2024-01-25: 100 ug/kg/min via INTRAVENOUS

## 2024-01-25 MED ORDER — SODIUM CHLORIDE 0.9 % IV SOLN
INTRAVENOUS | Status: AC | PRN
  Administered 2024-01-25: 500 mL via INTRAVENOUS

## 2024-01-25 MED ORDER — SODIUM CHLORIDE 0.9 % IV SOLN: INTRAVENOUS | Status: DC | PRN

## 2024-01-25 MED ORDER — LIDOCAINE HCL (PF) 2 % IJ SOLN
INTRAMUSCULAR | Status: DC | PRN
Start: 2024-01-25 — End: 2024-01-25
  Administered 2024-01-25: 60 mg via INTRADERMAL

## 2024-01-25 MED ORDER — PROPOFOL 10 MG/ML IV BOLUS
INTRAVENOUS | Status: DC | PRN
  Administered 2024-01-25 (×5): 20 mg via INTRAVENOUS

## 2024-01-25 MED ORDER — PHENYLEPHRINE HCL-NACL 20-0.9 MG/250ML-% IV SOLN
INTRAVENOUS | Status: DC | PRN
Start: 2024-01-25 — End: 2024-01-25
  Administered 2024-01-25: 80 ug via INTRAVENOUS

## 2024-01-25 NOTE — Anesthesia Postprocedure Evaluation (Signed)
 Anesthesia Post Note  Patient: Brenda Stephens  Procedure(s) Performed: EGD (ESOPHAGOGASTRODUODENOSCOPY) COLONOSCOPY     Patient location during evaluation: PACU Anesthesia Type: MAC Level of consciousness: awake and alert Pain management: pain level controlled Vital Signs Assessment: post-procedure vital signs reviewed and stable Respiratory status: spontaneous breathing, nonlabored ventilation, respiratory function stable and patient connected to nasal cannula oxygen Cardiovascular status: stable and blood pressure returned to baseline Postop Assessment: no apparent nausea or vomiting Anesthetic complications: no   No notable events documented.  Last Vitals:  Vitals:   01/25/24 1540 01/25/24 1550  BP:    Pulse: 86 76  Resp: (!) 26 (!) 22  Temp:    SpO2: 94% 96%    Last Pain:  Vitals:   01/25/24 1623  TempSrc:   PainSc: 5                  Reco Shonk

## 2024-01-25 NOTE — Progress Notes (Signed)
 PT Cancellation Note  Patient Details Name: DAWNELL BRYANT MRN: [id number] DOB: 12/26/1940   Cancelled Treatment:    Reason Eval/Treat Not Completed: Medical issues which prohibited therapy. Discussed pt case with OT who reports pt symptomatically orthostatic at this time. Systolically, BP dropped from 166 to 86 with sit to stand, and OT recommends PT wait to initiate eval. Will check on patient later this morning or early afternoon to attempt mobility again with close assessment of BP.    [name] 01/25/2024, 9:04 AM  [name], PT, DPT [facility] Secure Chat Preferred Office: [phone]

## 2024-01-25 NOTE — Progress Notes (Incomplete)
 HD#0 SUBJECTIVE:  Patient Summary: Brenda Stephens is a 83 y.o. with a pertinent PMH of Hypothyroidism, afib, GERD, dementia, diverticulous, rectal bleeding, and DVT presented today after experiencing a fall at home.   Overnight Events: N/A  Interim History:  Patient reports noticing blood with bowel movements. The most recent episode was yesterday, described as a single occurrence of light bleeding, though she felt it was a significant amount. She denies any associated pain but reports a sensation that something came out. She denies dyspepsia. No pain with swallowing, but does note a sensation of food getting stuck. This symptom is stable and not worsening; difficulty is more pronounced with solids than liquids. Patient reports a sensation of her heart beating fast on the left side of her chest, ongoing for the past few weeks.   OBJECTIVE:  Vital Signs: Vitals:   01/25/24 1540 01/25/24 1550 01/25/24 1719 01/25/24 2035  BP:   (!) 151/53 (!) 151/57  Pulse: 86 76 86 (!) 101  Resp: (!) 26 (!) 22 14   Temp:   98.2 F (36.8 C) 98.7 F (37.1 C)  TempSrc:   Oral Oral  SpO2: 94% 96% 97% 96%  Weight:      Height:        Supplemental O2: Room Air   SpO2: 96 % O2 Flow Rate (L/min): 6 L/min  Filed Weights   01/24/24 0905 01/24/24 1322 01/25/24 0600  Weight: 83.9 kg 84.2 kg 88.7 kg     Intake/Output Summary (Last 24 hours) at 01/25/2024 2118 Last data filed at 01/25/2024 1720 Gross per 24 hour  Intake 870.62 ml  Output --  Net 870.62 ml   Net IO Since Admission: 888.62 mL [01/25/24 2118]  Physical Exam: Physical Exam Lung: clear without crackle Heart: Tachycardic, Irregular Abdomen: No tenderness or rebounding Lower Ext: No swelling Radial Pulses: full  Patient Lines/Drains/Airways Status     Active Line/Drains/Airways     Name Placement date Placement time Site Days   Peripheral IV 01/24/24 18 G 1 Left Antecubital 01/24/24  0845  Antecubital  1   External  Urinary Catheter 01/25/24  0654  --  less than 1            Pertinent labs and imaging:      Latest Ref Rng & Units 01/25/2024    9:47 AM 01/24/2024    9:16 AM 01/24/2024    8:54 AM  CBC  WBC 4.0 - 10.5 K/uL 10.8   7.9   Hemoglobin 12.0 - 15.0 g/dL 7.6  8.8  7.9   Hematocrit 36.0 - 46.0 % 23.6  26.0  24.5   Platelets 150 - 400 K/uL 377   374        Latest Ref Rng & Units 01/25/2024    9:47 AM 01/24/2024    9:16 AM 01/24/2024    8:54 AM  CMP  Glucose 70 - 99 mg/dL 870  880  874   BUN 8 - 23 mg/dL 14  26  21    Creatinine 0.44 - 1.00 mg/dL 8.84  8.59  8.61   Sodium 135 - 145 mmol/L 141  136  134   Potassium 3.5 - 5.1 mmol/L 3.7  3.7  3.2   Chloride 98 - 111 mmol/L 113  105  104   CO2 22 - 32 mmol/L 17   17   Calcium  8.9 - 10.3 mg/dL 9.3   9.3   Total Protein 6.5 - 8.1 g/dL   6.6  Total Bilirubin 0.0 - 1.2 mg/dL   0.6   Alkaline Phos 38 - 126 U/L   53   AST 15 - 41 U/L   25   ALT 0 - 44 U/L   17     No results found.  ASSESSMENT/PLAN:  Assessment: Principal Problem:   Symptomatic anemia Active Problems:   Hypothyroidism   Iron  deficiency anemia   History of diverticulosis   Postural dizziness with presyncope   Anxiety and depression   Dementia without behavioral disturbance (HCC)   History of pulmonary embolism   History of DVT of lower extremity   PAF (paroxysmal atrial fibrillation) (HCC)   Hypercoagulable state due to paroxysmal atrial fibrillation (HCC)   Anemia due to GI blood loss   Chronic kidney disease, stage 3a (HCC)   Fall   Dysphagia   Colitis   Plan: Iron  Deficiency Anemia GI bleed The patient reports a month long history of bright red rectal bleeding.Lat bleeding was yesterday, light massive bleeding, denies melena. Rectal bleeding, thought to be related to hemorrhoids,  EGD and Colonoscopy have done on 7/28:  EGD: No endoscopic abnormalities were found in the esophagus, stomach, or duodenum to explain the patient's dysphagia. Biopsies from  the proximal and distal esophagus were taken to evaluate for eosinophilic esophagitis. Colonoscopy: Non-thrombosed external hemorrhoids and internal hemorrhoids that prolapse with straining, but spontaneously regress to the resting position (Grade II) found on digital rectal exam. Diverticulosis in the sigmoid colon,One 4 mm polyp in the descending colon, removed.   -Hgb 7.3 from 7.6 - Start Iron  supplement (Ferritin 6 and Saturation ratio's 2) -Protonix  40mg  -Transfuse if Hgb<7 -High fiber diet. -Hold Eliquise -start Prepration H and  F/U with General Surgery for hemorrhoid   Falls Postural dizziness with presyncope Chronic Pain The patient admits to frequently falling at home, the las fall was not because of lightheadedness but she had hx of lightheadedness before. She endorses feeling weak with her muscles and also complaining of pain after her fall and she had to crawl to move. She also reports getting up and feeling light headed frequently.  - Held Metoprolol  due to orthostatic hypotension -PT/OT and check orthostatic hypotension or episode of dizziness while ambulating -restart her home Tramadol  (taking chronically). -Tylenol  1000mg  q6 -1mg  Dilaudid  PO q4 PRN     Esophageal dysphagia  for the long time, nonprogressive, not associated with weight loss,  Worse with solids>liquids.  There is no pain in swallowing just like feeling something stuck.  EGD on 7/28: No endoscopic abnormalities were found in the esophagus, stomach, or duodenum to explain the patient's dysphagia. Biopsies from the proximal and distal esophagus were taken to evaluate for eosinophilic esophagitis. -resume a regular diet -await pathology results   Hypokalemia:  Potasium today is 3 - started 40 meq potassium chloride   -check BMP and Magnesium   Elevated Scr CKD Stage 3a Improving, BUN 14 with Cr 1.1 . Baseline variable, but ~1.2. -Pt volume down on admission, also with chronic blood loss from GI  Bleed      Urinary Retention -Pt on flomax  and oxybutinin at home. Held medication here. -Pt has Purewick and good urination   A-fib Hx of DVT Hx of PE Patient has a history of atrial fibrillation on Eliquis  along with metoprolol  tartrate for rate control.She was tachycardic but in normal rhythm.  - keep Holding Eliquis  due to active bleeding (Last dose of Eliquis  was administered on 7/26.) -Metoprolol  stopped due to episode of orthostatic hypotension, but she  had palpitation possibly due to volume loss (rectal bleeding) and holding metoprolol      Dementia -Continue home Donepezil , Memantine , and seroquel  at bedtime   Best Practice: Diet: Regular diet IVF: None VTE: SCDs Start: 01/24/24 1216 Code: DNR  Disposition planning: DME: walker Family Contact: Daughter, called and notified. DISPO: Anticipated discharge in 2 days to home pending for clinical improvement.  Signature:  Pilar Westergaard [facility]ad[facility]  9:18 PM, 01/25/2024  On Call pager [phone]

## 2024-01-25 NOTE — Plan of Care (Signed)

## 2024-01-25 NOTE — Transfer of Care (Signed)
 Immediate Anesthesia Transfer of Care Note  Patient: Brenda Stephens  Procedure(s) Performed: EGD (ESOPHAGOGASTRODUODENOSCOPY) COLONOSCOPY  Patient Location: Endoscopy Unit  Anesthesia Type:MAC  Level of Consciousness: drowsy and patient cooperative  Airway & Oxygen Therapy: Patient Spontanous Breathing and Patient connected to face mask oxygen  Post-op Assessment: Report given to RN, Post -op Vital signs reviewed and stable, Patient moving all extremities, and Patient moving all extremities X 4  Post vital signs: Reviewed and stable  Last Vitals:  Vitals Value Taken Time  BP 146/54 01/25/24 15:14  Temp    Pulse 99 01/25/24 15:17  Resp 20 01/25/24 15:17  SpO2 97 % 01/25/24 15:17  Vitals shown include unfiled device data.  Last Pain:  Vitals:   01/25/24 1305  TempSrc: Temporal  PainSc: 0-No pain         Complications: No notable events documented.

## 2024-01-25 NOTE — Op Note (Signed)
 [facility] Patient Name: Brenda Stephens Procedure Date : 01/25/2024 MRN: [id number] Attending MD: Estefana Keas DO, DO, [id number] Date of Birth: Oct 07, 1940 CSN: [id number] Age: 83 Admit Type: Inpatient Procedure:                Upper GI endoscopy Indications:              Dysphagia Providers:                Estefana Keas DO, DO, Particia Fischer, RN, Coye Bade, Technician Referring MD:              Medicines:                See the Anesthesia note for documentation of the                            administered medications Complications:            No immediate complications. Estimated Blood Loss:     Estimated blood loss was minimal. Procedure:                Pre-Anesthesia Assessment:                           - ASA Grade Assessment: III - A patient with severe                            systemic disease.                           - The risks and benefits of the procedure and the                            sedation options and risks were discussed with the                            patient. All questions were answered and informed                            consent was obtained.                           After obtaining informed consent, the endoscope was                            passed under direct vision. Throughout the                            procedure, the patient's blood pressure, pulse, and                            oxygen saturations were monitored continuously. The                            [id number]-[id number] ([id number]) Olympus endoscope was introduced  through the mouth, and advanced to the second part                            of duodenum. The upper GI endoscopy was                            accomplished without difficulty. The patient                            tolerated the procedure well. Scope In: Scope Out: Findings:      No endoscopic abnormality was evident in the esophagus to explain the        patient's complaint of dysphagia. Biopsies were obtained from the       proximal and distal esophagus with cold forceps for histology of       suspected eosinophilic esophagitis.      The entire examined stomach was normal.      The examined duodenum was normal. Impression:               - No endoscopic esophageal abnormality to explain                            patient's dysphagia.                           - Normal stomach.                           - Normal examined duodenum.                           - Biopsies were taken with a cold forceps for                            evaluation of eosinophilic esophagitis. Recommendation:           - Return patient to hospital ward for ongoing care.                           - Resume regular diet.                           - Continue present medications.                           - Await pathology results.                           - Perform a colonoscopy today. Procedure Code(s):        --- Professional ---                           [phone], Esophagogastroduodenoscopy, flexible,                            transoral; with biopsy, single or multiple Diagnosis Code(s):        --- Professional ---  R13.10, Dysphagia, unspecified CPT copyright 2022 American [employer] Association. All rights reserved. The codes documented in this report are preliminary and upon coder review may  be revised to meet current compliance requirements. Dr Estefana Keas, DO Estefana Keas DO, DO 01/25/2024 3:39:28 PM Number of Addenda: 0

## 2024-01-25 NOTE — Anesthesia Preprocedure Evaluation (Signed)
 Anesthesia Evaluation  Patient identified by MRN, date of birth, ID band Patient awake    Reviewed: Allergy & Precautions, NPO status , Patient's Chart, lab work & pertinent test results, reviewed documented beta blocker date and time   Airway Mallampati: III  TM Distance: >3 FB Neck ROM: Full  Mouth opening: Limited Mouth Opening  Dental no notable dental hx. (+) Teeth Intact, Dental Advisory Given   Pulmonary neg pulmonary ROS, former smoker   Pulmonary exam normal breath sounds clear to auscultation       Cardiovascular hypertension, Pt. on home beta blockers and Pt. on medications + DOE  Normal cardiovascular exam Rhythm:Regular Rate:Normal  Stress Test 2019 Small size, mild intensity fixed apical anterior perfusion defect, likely breast attenuation artifact. No reversible ischemia. LVEF 70% with normal wall motion. This is a low risk study   Neuro/Psych  PSYCHIATRIC DISORDERS Anxiety Depression    negative neurological ROS     GI/Hepatic Neg liver ROS,GERD  ,,  Endo/Other  Hypothyroidism    Renal/GU Renal disease  negative genitourinary   Musculoskeletal  (+) Arthritis ,    Abdominal   Peds  Hematology  (+) Blood dyscrasia, anemia   Anesthesia Other Findings   Reproductive/Obstetrics                              Anesthesia Physical Anesthesia Plan  ASA: 3  Anesthesia Plan: MAC   Post-op Pain Management: Minimal or no pain anticipated   Induction: Intravenous  PONV Risk Score and Plan: 3 and Treatment may vary due to age or medical condition and Propofol  infusion  Airway Management Planned: Natural Airway and Simple Face Mask  Additional Equipment: None  Intra-op Plan:   Post-operative Plan: Extubation in OR  Informed Consent: I have reviewed the patients History and Physical, chart, labs and discussed the procedure including the risks, benefits and alternatives for the  proposed anesthesia with the patient or authorized representative who has indicated his/her understanding and acceptance.     Dental advisory given  Plan Discussed with: CRNA and Anesthesiologist  Anesthesia Plan Comments:          Anesthesia Quick Evaluation

## 2024-01-25 NOTE — Progress Notes (Signed)
 HD#0 SUBJECTIVE:  Patient Summary: Brenda Stephens is a 83 y.o. with a pertinent PMH of PMH of Hypothyroidism, afib, GERD, dementia, diverticulous, rectal bleeding, and DVT presented today after experiencing a fall at home.  Overnight Events: N/A  Interim History:  Patient reports feeling anxious and experienced lightheadedness after getting out of bed, resulting in a fall while using a walker. She did not lose consciousness or experience vision changes but noted palpitations in the left chest prior to the fall, which she had not felt before. She describes numbness in her legs and had to crawl after the fall, hitting the back of her head and shoulders. She developed a new headache yesterday, which has since improved.  She is currently on telemetry monitoring. The patient has a history of dizziness but is not dizzy now and had no orthostatic changes yesterday. She reports stomach soreness.  Regarding her bright red blood per rectum, she had a normal colonoscopy 6-7 years ago and does not believe this is related to hemorrhoids.    OBJECTIVE:  Vital Signs: Vitals:   01/25/24 1520 01/25/24 1530 01/25/24 1540 01/25/24 1550  BP: (!) 161/52 (!) 142/53    Pulse: 97 94 86 76  Resp: (!) 26 (!) 24 (!) 26 (!) 22  Temp:      TempSrc:      SpO2: 94% 92% 94% 96%  Weight:      Height:       Supplemental O2: Room Air SpO2: 96 % O2 Flow Rate (L/min): 6 L/min  Filed Weights   01/24/24 0905 01/24/24 1322 01/25/24 0600  Weight: 83.9 kg 84.2 kg 88.7 kg     Intake/Output Summary (Last 24 hours) at 01/25/2024 1623 Last data filed at 01/25/2024 1517 Gross per 24 hour  Intake 835.44 ml  Output --  Net 835.44 ml   Net IO Since Admission: 853.44 mL [01/25/24 1623]  Physical Exam: Physical Exam General: Alert, no acute distress.  HEENT: Soft, supple, with full range of motion. No cervical lymphadenopathy or thyromegaly appreciated. Cardiac: PAC heard Lungs: Clear to auscultation bilaterally  in all fields Abdomen: Non-distended, soft, non-tender,  Back: No CVA tenderness. Extremities: Warm and well-perfused, with no cyanosis, or edema. Neurological: Alert and oriented 4  Skin: No rashes noted.  Patient Lines/Drains/Airways Status     Active Line/Drains/Airways     Name Placement date Placement time Site Days   Peripheral IV 01/24/24 18 G 1 Left Antecubital 01/24/24  0845  Antecubital  1   External Urinary Catheter 01/25/24  0654  --  less than 1            Pertinent labs and imaging:      Latest Ref Rng & Units 01/25/2024    9:47 AM 01/24/2024    9:16 AM 01/24/2024    8:54 AM  CBC  WBC 4.0 - 10.5 K/uL 10.8   7.9   Hemoglobin 12.0 - 15.0 g/dL 7.6  8.8  7.9   Hematocrit 36.0 - 46.0 % 23.6  26.0  24.5   Platelets 150 - 400 K/uL 377   374        Latest Ref Rng & Units 01/25/2024    9:47 AM 01/24/2024    9:16 AM 01/24/2024    8:54 AM  CMP  Glucose 70 - 99 mg/dL 870  880  874   BUN 8 - 23 mg/dL 14  26  21    Creatinine 0.44 - 1.00 mg/dL 8.84  8.59  8.61  Sodium 135 - 145 mmol/L 141  136  134   Potassium 3.5 - 5.1 mmol/L 3.7  3.7  3.2   Chloride 98 - 111 mmol/L 113  105  104   CO2 22 - 32 mmol/L 17   17   Calcium  8.9 - 10.3 mg/dL 9.3   9.3   Total Protein 6.5 - 8.1 g/dL   6.6   Total Bilirubin 0.0 - 1.2 mg/dL   0.6   Alkaline Phos 38 - 126 U/L   53   AST 15 - 41 U/L   25   ALT 0 - 44 U/L   17     No results found.  ASSESSMENT/PLAN:  Assessment: Principal Problem:   Symptomatic anemia Active Problems:   Hypothyroidism   Iron  deficiency anemia   History of diverticulosis   Postural dizziness with presyncope   Anxiety and depression   Dementia without behavioral disturbance (HCC)   History of pulmonary embolism   History of DVT of lower extremity   PAF (paroxysmal atrial fibrillation) (HCC)   Hypercoagulable state due to paroxysmal atrial fibrillation (HCC)   Anemia due to GI blood loss   Chronic kidney disease, stage 3a (HCC)   Fall    Dysphagia   Colitis   Plan: Iron  Deficiency Anemia GI bleed The patient reports a month long history of bright red rectal bleeding. She denies melena,Rectal bleeding, thought to be related to hemorrhoids, intermittent, ongoing, no improvement with suppositories and hemorrhoidal ointments, daughter requesting for colonoscopy while she is in the hospital. -Hgb 7.6, on admission 7.9  -Ferritin 6 and Saturation ratio's 2 -Protonix  40mg  -Transfuse if Hgb<7 EGD and Colonoscopy have done on 7/28:  EGD: No endoscopic abnormalities were found in the esophagus, stomach, or duodenum to explain the patient's dysphagia. Biopsies from the proximal and distal esophagus were taken to evaluate for eosinophilic esophagitis. Colonoscopy: Non-thrombosed external hemorrhoids and internal hemorrhoids that prolapse with straining, but spontaneously regress to the resting position (Grade II) found on digital rectal exam. Diverticulosis in the sigmoid colon,One 4 mm polyp in the descending colon, removed.  -High fiber diet.  - Continue present medications. - Await pathology results.  - Return to GI office in 4-6 weeks if dysphagia persistent. - Evaluation by General Surg for internal/external hemorrhoid therapy if needed    Falls Postural dizziness with presyncope Chronic Pain The patient admits to frequently falling at home, the las fall was not because of lightheadedness but she had hx of lightheadedness before. She endorses feeling weak with her muscles and also complaining of pain after her fall and she had to crawl to move. She also reports getting up and feeling light headed frequently.  -Orthostatic vitals: Supine BP: 161/51 Sitting BP:166/120 Standing BP: 86/62, reports dizziness - attempt to narrow this list while inpatient -PT/OT -restart her home Tramadol  (taking chronically). -Tylenol  1000mg  q6 -1mg  Dilaudid  PO q4 PRN    Esophageal dysphagia for the long time, nonprogressive, not associated  with weight loss,  Worse with solids>liquids. She denies difficulty swallowing or choking frequently. This has worsened over the last few months. EGD on 7/28: No endoscopic abnormalities were found in the esophagus, stomach, or duodenum to explain the patient's dysphagia. Biopsies from the proximal and distal esophagus were taken to evaluate for eosinophilic esophagitis. -resume a regular diet -continue current medications -await pathology results   Elevated Scr CKD Stage 3a Improving, BUN 14 with Cr 1.15 . Baseline variable, but ~1.2. -Pt volume down on admission, also with chronic  blood loss from GI Bleed   Urinary Retention -Pt on flomax  and oxybutinin at home. Has not voided since arriving to the ED, despite receiving 1L NS.  - Pt has foley   A-fib Hx of DVT Hx of PE Patient has a history of atrial fibrillation and is on Eliquis  along with metoprolol  tartrate for rate control. Currently in normal sinus rhythm with a heart rate of 58 bpm.  -There is a low threshold to restart metoprolol  if the heart rate increases. -Last dose of Eliquis  was administered on 7/26. -Patient has a hypercoagulable state with a history of DVT and PE. A risk-benefit discussion regarding anticoagulation was conducted today with the patient and daughter, given the current GI bleeding and anemia. -Eliquis  will be held at this time.    Dementia -Continue home Donepezil , Memantine , and seroquel  at bedtime.   Best Practice: Diet: Full Liquid IVF: none VTE: SCDs Start: 01/24/24 1216 Code: DNR  Disposition planning: DISPO: Anticipated discharge 2 days to Home, live with her daughter  Signature:  Gardenia Witter [facility]  4:23 PM, 01/25/2024  On Call pager [phone]

## 2024-01-25 NOTE — TOC Initial Note (Signed)
 Transition of Care [facility]) - Initial/Assessment Note    Patient Details  Name: Brenda Stephens MRN: [id number] Date of Birth: 09/18/1940  Transition of Care [facility]) CM/SW Contact:    Jeoffrey LITTIE Moose, LCSW Phone Number: 01/25/2024, 9:23 AM  Clinical Narrative:                 Pt admitted from home due to fall. No current TOC needs, please consult as needs arise following therapy eval.        Patient Goals and CMS Choice            Expected Discharge Plan and Services                                              Prior Living Arrangements/Services                       Activities of Daily Living      Permission Sought/Granted                  Emotional Assessment              Admission diagnosis:  Fall, initial encounter [W19.XXXA] Symptomatic anemia [D64.9] Patient Active Problem List   Diagnosis Date Noted   Hypercoagulable state due to paroxysmal atrial fibrillation (HCC) 01/24/2024   Anemia due to GI blood loss 01/24/2024   Symptomatic anemia 01/24/2024   Fall 01/24/2024   Dysphagia 01/24/2024   Aortic root enlargement (HCC) 11/25/2023   Precordial chest pain 11/25/2023   Near syncope 11/21/2023   PAF (paroxysmal atrial fibrillation) (HCC) 09/27/2023   Elevated TSH 09/27/2023   Rectal bleed 07/09/2023   Current use of long term anticoagulation 07/09/2023   Rapid atrial fibrillation (HCC) 02/11/2023   History of pulmonary embolism 02/11/2023   History of DVT of lower extremity 02/11/2023   Grade I diastolic dysfunction 07/15/2022   Anxiety and depression 07/02/2022   Dementia without behavioral disturbance (HCC) 07/02/2022   HX: breast cancer 07/02/2022   Chronic kidney disease, stage 3a (HCC) 04/23/2022   Right lower lobe pulmonary nodule 10/24/2021   Palpitations 01/03/2021   Cerebrovascular disease 04/25/2020   DOE (dyspnea on exertion) 10/12/2019   Rotator cuff arthropathy, left 01/12/2019   Postural dizziness with  presyncope 02/04/2018   Leg swelling 12/17/2017   Asthmatic bronchitis, mild intermittent, uncomplicated 08/04/2017   Malignant neoplasm of upper-outer quadrant of right breast in female, estrogen receptor positive (HCC) 03/13/2017   Acute chest pain 05/10/2013   Fasting hyperglycemia 06/13/2011   Hypothyroidism 05/06/2010   Hyperlipidemia 05/06/2010   ARTHRITIS, SHOULDER 05/06/2010   FATIGUE, CHRONIC 05/06/2010   Seasonal and perennial allergic rhinitis 03/14/2010   Iron  deficiency anemia 10/29/2009   Essential hypertension 10/29/2009   GERD 10/29/2009   History of diverticulosis 10/29/2009   UMBILICAL HERNIA, HX OF 10/29/2009   PCP:  Loris Elsie PARAS, PA-C Pharmacy:   [facility] [place], [place] - [address] [place] [address] Phone: [phone] Fax: [phone]  [facility] - [facility] [address] [place] [address] Phone: [phone] Fax: [phone]  [facility] [address], [place] - [address] [place] [address] Phone: [phone] Fax: [phone]     Social Drivers of Health (SDOH) Social History: SDOH Screenings  Food Insecurity: No Food Insecurity (01/24/2024)  Housing: Low Risk  (01/24/2024)  Transportation Needs: No Transportation Needs (01/24/2024)  Utilities: Not At Risk (01/24/2024)  Financial Resource Strain: Low Risk  (03/12/2021)   Received from [facility] visits prior to 08/30/2022.  Physical Activity: Inactive (03/12/2021)   Received from [facility] visits prior to 08/30/2022.  Social Connections: Moderately Integrated (01/24/2024)  Stress: Stress Concern Present (03/12/2021)   Received from [facility] visits prior to 08/30/2022.  Tobacco Use: Medium Risk (01/24/2024)   SDOH Interventions: Food Insecurity Interventions: Intervention Not Indicated Housing  Interventions: Intervention Not Indicated Transportation Interventions: Intervention Not Indicated Utilities Interventions: Intervention Not Indicated Social Connections Interventions: Intervention Not Indicated   Readmission Risk Interventions     No data to display

## 2024-01-25 NOTE — Op Note (Signed)
 [facility] Patient Name: Brenda Stephens Procedure Date : 01/25/2024 MRN: [id number] Attending MD: Estefana Keas DO, DO, [id number] Date of Birth: 1941/04/24 CSN: [id number] Age: 83 Admit Type: Inpatient Procedure:                Colonoscopy Indications:              Rectal bleeding Providers:                Estefana Keas DO, DO, Particia Fischer, RN, Coye Bade, Technician Referring MD:              Medicines:                See the Anesthesia note for documentation of the                            administered medications Complications:            No immediate complications. Estimated Blood Loss:     Estimated blood loss was minimal. Procedure:                Pre-Anesthesia Assessment:                           - ASA Grade Assessment: III - A patient with severe                            systemic disease.                           - The risks and benefits of the procedure and the                            sedation options and risks were discussed with the                            patient. All questions were answered and informed                            consent was obtained.                           After obtaining informed consent, the colonoscope                            was passed under direct vision. Throughout the                            procedure, the patient's blood pressure, pulse, and                            oxygen saturations were monitored continuously. The                            CF-HQ190L ([id number]) Olympus coloscope was  introduced through the anus and advanced to the the                            cecum, identified by appendiceal orifice and                            ileocecal valve. The colonoscopy was performed                            without difficulty. The patient tolerated the                            procedure well. The quality of the bowel                             preparation was evaluated using the BBPS [facility]                            Bowel Preparation Scale) with scores of: Right                            Colon = 2 (minor amount of residual staining, small                            fragments of stool and/or opaque liquid, but mucosa                            seen well), Transverse Colon = 3 (entire mucosa                            seen well with no residual staining, small                            fragments of stool or opaque liquid) and Left Colon                            = 3 (entire mucosa seen well with no residual                            staining, small fragments of stool or opaque                            liquid). The total BBPS score equals 8. The quality                            of the bowel preparation was good. Scope In: 2:45:20 PM Scope Out: 3:09:05 PM Scope Withdrawal Time: 0 hours 11 minutes 23 seconds  Total Procedure Duration: 0 hours 23 minutes 45 seconds  Findings:      The digital rectal exam findings include non-thrombosed external       hemorrhoids and internal hemorrhoids that prolapse with straining, but       spontaneously regress to the resting position (Grade II).  A few small-mouthed diverticula were found in the sigmoid colon.      A 4 mm polyp was found in the descending colon. The polyp was sessile.       The polyp was removed with a cold snare. Resection and retrieval were       complete. Impression:               - Non-thrombosed external hemorrhoids and internal                            hemorrhoids that prolapse with straining, but                            spontaneously regress to the resting position                            (Grade II) found on digital rectal exam.                           - Diverticulosis in the sigmoid colon.                           - One 4 mm polyp in the descending colon, removed                            with a cold snare. Resected and  retrieved. Recommendation:           - Return patient to hospital ward for ongoing care.                           - High fiber diet.                           - Continue present medications.                           - Await pathology results.                           - Repeat colonoscopy date to be determined after                            pending pathology results are reviewed for                            surveillance based on pathology results.                           - Return to GI office in 4-6 weeks if dysphagia                            persistent.                           - Evaluation by General Surgery for  internal/external hemorrhoid therapy if needed.                           - [facility] will sign off. Procedure Code(s):        --- Professional ---                           [id number], Colonoscopy, flexible; with removal of                            tumor(s), polyp(s), or other lesion(s) by snare                            technique Diagnosis Code(s):        --- Professional ---                           K64.1, Second degree hemorrhoids                           K64.4, Residual hemorrhoidal skin tags                           D12.4, Benign neoplasm of descending colon                           K62.5, Hemorrhage of anus and rectum                           K57.30, Diverticulosis of large intestine without                            perforation or abscess without bleeding CPT copyright 2022 American Medical Association. All rights reserved. The codes documented in this report are preliminary and upon coder review may  be revised to meet current compliance requirements. Dr Estefana Keas, DO Estefana Keas DO, DO 01/25/2024 3:46:20 PM Number of Addenda: 0

## 2024-01-25 NOTE — Evaluation (Signed)
 Occupational Therapy Evaluation Patient Details Name: Brenda Stephens MRN: [id number] DOB: 1941/01/03 Today's Date: 01/25/2024   History of Present Illness   Brenda Stephens is a 83 yo female who presented after a fall at home. She also reports frequent light headedness and rectal bleeding. PMH: hypertension, hyperlipidemia, hypothyroidism, GERD, A-fib, diastolic dysfunction, anxiety, depression, dementia, breast cancer, PE, DVT, asthmatic bronchitis, anemia     Clinical Impressions Brenda Stephens was evaluated s/p the above admission list. She is mod I for mobility with rollator/SPC and her daughter assists intermittently with ADLs at baseline. Upon evaluation the pt was limited by dizziness with positional changes, significant BP drop, generalized weakness, poor activity tolerance and unsteady balance. Overall she was able to complete bed mobility and STS with RW without physical assist, CGA provided for safety. Pt only tolerated ~10 seconds in standing prior to returning supine due to feeling extremely faint. Due to the deficits listed below the pt also needs up to mod A for LB ADLs and set up A for UB ADLs. Pt will benefit from continued acute OT services and HHOT with increased assist from family as needed.   Supine BP: 161/51 Sitting BP:166/120 Standing BP: 86/62 *report if dizziness* Supine BP: 132/71      If plan is discharge home, recommend the following:   A little help with walking and/or transfers;A lot of help with bathing/dressing/bathroom;Direct supervision/assist for medications management;Direct supervision/assist for financial management;Assist for transportation;Help with stairs or ramp for entrance;Supervision due to cognitive status     Functional Status Assessment   Patient has had a recent decline in their functional status and demonstrates the ability to make significant improvements in function in a reasonable and predictable amount of time.     Equipment  Recommendations   BSC/3in1      Precautions/Restrictions   Precautions Precautions: Fall Precaution/Restrictions Comments: watch BP Restrictions Weight Bearing Restrictions Per Provider Order: No     Mobility Bed Mobility Overal bed mobility: Needs Assistance Bed Mobility: Supine to Sit, Sit to Supine, Rolling Rolling: Supervision   Supine to sit: Contact guard Sit to supine: Contact guard assist   General bed mobility comments: incr time    Transfers Overall transfer level: Needs assistance Equipment used: Rolling walker (2 wheels) Transfers: Sit to/from Stand Sit to Stand: Contact guard assist           General transfer comment: CGA for safety, pt tolerated ~10 seconds standing      Balance Overall balance assessment: Needs assistance Sitting-balance support: Feet supported Sitting balance-Leahy Scale: Good     Standing balance support: Bilateral upper extremity supported, During functional activity Standing balance-Leahy Scale: Poor                             ADL either performed or assessed with clinical judgement   ADL Overall ADL's : Needs assistance/impaired Eating/Feeding: Independent   Grooming: Set up;Sitting   Upper Body Bathing: Set up;Sitting   Lower Body Bathing: Moderate assistance;Sit to/from stand   Upper Body Dressing : Set up;Sitting   Lower Body Dressing: Moderate assistance;Sit to/from stand   Toilet Transfer: Contact guard assist;Rolling walker (2 wheels);BSC/3in1;Stand-pivot Toilet Transfer Details (indicate cue type and reason): SP due to BP Toileting- Clothing Manipulation and Hygiene: Contact guard assist;Sitting/lateral lean       Functional mobility during ADLs: Contact guard assist;Rolling walker (2 wheels) General ADL Comments: no physical assist, cues for safety, incr time due to dizziness.  significant drop in BP with sit>stand     Vision Baseline Vision/History: 0 No visual deficits Vision  Assessment?: No apparent visual deficits     Perception Perception: Within Functional Limits       Praxis Praxis: WFL       Pertinent Vitals/Pain Pain Assessment Pain Assessment: No/denies pain     Extremity/Trunk Assessment Upper Extremity Assessment Upper Extremity Assessment: Generalized weakness   Lower Extremity Assessment Lower Extremity Assessment: Defer to PT evaluation   Cervical / Trunk Assessment Cervical / Trunk Assessment: Normal   Communication Communication Communication: No apparent difficulties   Cognition Arousal: Alert Behavior During Therapy: Flat affect Cognition: No family/caregiver present to determine baseline, History of cognitive impairments             OT - Cognition Comments: Pt able to recall PLOF adn home set up, followed all simple commands and expressed wants/needs                 Following commands: Intact       Cueing  General Comments   Cueing Techniques: Verbal cues  orthostatic drop in BP           Home Living Family/patient expects to be discharged to:: Private residence Living Arrangements: Children Available Help at Discharge: Family;Available 24 hours/day Type of Home: House Home Access: Stairs to enter [employer] of Steps: 2 Entrance Stairs-Rails: None Home Layout: Two level;Bed/bath upstairs;Able to live on main level with bedroom/bathroom Alternate Level Stairs-Number of Steps: stairs, landing, stairs Alternate Level Stairs-Rails: Right Bathroom Shower/Tub: Walk-in shower;Door   Bathroom Toilet: Handicapped height     Home Equipment: Agricultural consultant (2 wheels);Rollator (4 wheels);Grab bars - toilet;Grab bars - tub/shower;Shower seat - built in          Prior Functioning/Environment Prior Level of Function : Needs assist             Mobility Comments: increased use of rollator recently, SPC intermittently ADLs Comments: daughter assists with getting dressed daily,  intermitteny assist with bahting. Daughter completes IADLs    OT Problem List: Decreased strength;Decreased range of motion;Decreased activity tolerance;Impaired balance (sitting and/or standing);Decreased safety awareness;Decreased knowledge of precautions;Decreased knowledge of use of DME or AE   OT Treatment/Interventions: Self-care/ADL training;Therapeutic exercise;DME and/or AE instruction;Balance training;Patient/family education      OT Goals(Current goals can be found in the care plan section)   Acute Rehab OT Goals Patient Stated Goal: to feel better OT Goal Formulation: With patient Time For Goal Achievement: 02/08/24 Potential to Achieve Goals: Good ADL Goals Pt Will Perform Lower Body Dressing: with modified independence;sit to/from stand Pt Will Transfer to Toilet: with modified independence;ambulating Additional ADL Goal #1: Pt will indep recall at least 3 fall prevention strategies to reduce fall risk at discharge Additional ADL Goal #2: pt will tolerate at least 5 minutes of OOB funtional activity to demonstrate improved endurace for ADLs   OT Frequency:  Min 2X/week       AM-PAC OT 6 Clicks Daily Activity     Outcome Measure Help from another person eating meals?: None Help from another person taking care of personal grooming?: A Little Help from another person toileting, which includes using toliet, bedpan, or urinal?: A Little Help from another person bathing (including washing, rinsing, drying)?: A Lot Help from another person to put on and taking off regular upper body clothing?: A Little Help from another person to put on and taking off regular lower body clothing?: A Lot 6 Click  Score: 17   End of Session Equipment Utilized During Treatment: Rolling walker (2 wheels) Nurse Communication: Mobility status (+OH)  Activity Tolerance: Patient tolerated treatment well Patient left: in bed;with bed alarm set;with call bell/phone within reach  OT Visit  Diagnosis: Unsteadiness on feet (R26.81);Other abnormalities of gait and mobility (R26.89);Repeated falls (R29.6);Muscle weakness (generalized) (M62.81);History of falling (Z91.81);Pain;Dizziness and giddiness (R42)                Time: 9171-9149 OT Time Calculation (min): 22 min Charges:  OT General Charges $OT Visit: 1 Visit OT Evaluation $OT Eval Moderate Complexity: 1 Mod  Brenda Stephens, OTR/L Acute Rehabilitation Services Office [phone] Secure Chat Communication Preferred   Brenda Stephens 01/25/2024, 9:35 AM

## 2024-01-25 NOTE — Interval H&P Note (Signed)
 History and Physical Interval Note:  01/25/2024 2:19 PM  Brenda Stephens  has presented today for surgery, with the diagnosis of Rectal bleeding and dysphagia.  The various methods of treatment have been discussed with the patient and family. After consideration of risks, benefits and other options for treatment, the patient has consented to  Procedure(s): EGD (ESOPHAGOGASTRODUODENOSCOPY) (N/A) COLONOSCOPY (N/A) as a surgical intervention.  The patient's history has been reviewed, patient examined, no change in status, stable for surgery.  I have reviewed the patient's chart and labs.  Questions were answered to the patient's satisfaction.     Estefana VEAR Keas

## 2024-01-25 NOTE — Anesthesia Procedure Notes (Signed)
 Procedure Name: MAC Date/Time: 01/25/2024 2:37 PM  Performed by: Arvell Edsel HERO, CRNAPre-anesthesia Checklist: Patient identified, Emergency Drugs available, Suction available, Patient being monitored and Timeout performed Patient Re-evaluated:Patient Re-evaluated prior to induction Oxygen Delivery Method: Simple face mask

## 2024-01-25 NOTE — Progress Notes (Signed)
 PT Cancellation Note  Patient Details Name: Brenda Stephens MRN: [id number] DOB: 1940/10/05   Cancelled Treatment:    Reason Eval/Treat Not Completed: Medical issues which prohibited therapy. Checked back in with pt and nurse. Per RN, pt prepped for procedure at 1:00 pm and agrees that PT should hold eval today. Will check back at next available date to initiate PT evaluation.    [name] 01/25/2024, 12:05 PM  [name], PT, DPT Acute Rehabilitation Services Secure Chat Preferred Office: [phone]

## 2024-01-25 NOTE — Progress Notes (Signed)
 Consent signed and in chart.

## 2024-01-26 ENCOUNTER — Encounter (HOSPITAL_COMMUNITY): Payer: Self-pay | Admitting: Internal Medicine

## 2024-01-26 DIAGNOSIS — E876 Hypokalemia: Secondary | ICD-10-CM

## 2024-01-26 LAB — BASIC METABOLIC PANEL WITH GFR
Anion gap: 9 (ref 5–15)
BUN: 14 mg/dL (ref 8–23)
CO2: 19 mmol/L — ABNORMAL LOW (ref 22–32)
Calcium: 9.2 mg/dL (ref 8.9–10.3)
Chloride: 110 mmol/L (ref 98–111)
Creatinine, Ser: 1.1 mg/dL — ABNORMAL HIGH (ref 0.44–1.00)
GFR, Estimated: 50 mL/min — ABNORMAL LOW (ref >60)
Glucose, Bld: 114 mg/dL — ABNORMAL HIGH (ref 70–99)
Potassium: 3 mmol/L — ABNORMAL LOW (ref 3.5–5.1)
Sodium: 138 mmol/L (ref 135–145)

## 2024-01-26 LAB — HEMOGLOBIN AND HEMATOCRIT, BLOOD
HCT: 23.3 % — ABNORMAL LOW (ref 36.0–46.0)
Hemoglobin: 7.3 g/dL — ABNORMAL LOW (ref 12.0–15.0)

## 2024-01-26 LAB — CBC
HCT: 23.6 % — ABNORMAL LOW (ref 36.0–46.0)
Hemoglobin: 7.3 g/dL — ABNORMAL LOW (ref 12.0–15.0)
MCH: 26.4 pg (ref 26.0–34.0)
MCHC: 30.9 g/dL (ref 30.0–36.0)
MCV: 85.5 fL (ref 80.0–100.0)
Platelets: 369 K/uL (ref 150–400)
RBC: 2.76 MIL/uL — ABNORMAL LOW (ref 3.87–5.11)
RDW: 14.1 % (ref 11.5–15.5)
WBC: 7.8 K/uL (ref 4.0–10.5)
nRBC: 0.4 % — ABNORMAL HIGH (ref 0.0–0.2)

## 2024-01-26 LAB — MAGNESIUM: Magnesium: 2.1 mg/dL (ref 1.7–2.4)

## 2024-01-26 MED ORDER — POTASSIUM CHLORIDE CRYS ER 20 MEQ PO TBCR
40.0000 meq | EXTENDED_RELEASE_TABLET | Freq: Once | ORAL | Status: AC
  Administered 2024-01-26: 40 meq via ORAL
  Filled 2024-01-26: qty 2

## 2024-01-26 MED ORDER — FERROUS SULFATE 325 (65 FE) MG PO TABS
325.0000 mg | ORAL_TABLET | Freq: Every day | ORAL | Status: DC
  Administered 2024-01-26 – 2024-01-29 (×4): 325 mg via ORAL
  Filled 2024-01-26 (×4): qty 1

## 2024-01-26 MED ORDER — ENSURE PLUS HIGH PROTEIN PO LIQD
237.0000 mL | Freq: Two times a day (BID) | ORAL | Status: DC
  Administered 2024-01-27 – 2024-01-29 (×5): 237 mL via ORAL

## 2024-01-26 MED ORDER — PHENYLEPHRINE IN HARD FAT 0.25 % RE SUPP
1.0000 | Freq: Two times a day (BID) | RECTAL | Status: DC
  Administered 2024-01-26 – 2024-01-28 (×4): 1 via RECTAL
  Filled 2024-01-26 (×10): qty 1

## 2024-01-26 NOTE — Plan of Care (Signed)

## 2024-01-26 NOTE — Plan of Care (Signed)

## 2024-01-26 NOTE — Evaluation (Signed)
 Physical Therapy Evaluation Patient Details Name: Brenda Stephens MRN: [id number] DOB: 1940/08/02 Today's Date: 01/26/2024  History of Present Illness  Brenda Stephens is a 83 yo female who presented after a fall at home. She also reports frequent light headedness and rectal bleeding. PMH: hypertension, hyperlipidemia, hypothyroidism, GERD, A-fib, diastolic dysfunction, anxiety, depression, dementia, breast cancer, PE, DVT, asthmatic bronchitis, anemia  Clinical Impression  Pt admitted with above diagnosis. Pt from home with daughter, used [facility] for mobility until recent weeks when she began needing her RW more due to dizziness and fatigue. Pt orthostatic today with transition from sit to stand. Maintained standing only 10 secs before beginning to shake and feel light headed. She also reports brain fogginess and had some difficulty answering home questions. This does not seem to be her baseline. Patient will benefit from continued inpatient follow up therapy, <3 hours/day due to her current limitations but pt hopeful to be able to go home. Pt currently with functional limitations due to the deficits listed below (see PT Problem List). Pt will benefit from acute skilled PT to increase their independence and safety with mobility to allow discharge.     BP sitting 163/71 Standing 98/52 Sitting 127/73 HR up to 125 bpm SPO2 90's on RA      If plan is discharge home, recommend the following: A lot of help with walking and/or transfers;A little help with bathing/dressing/bathroom;Assistance with cooking/housework;Assist for transportation;Help with stairs or ramp for entrance   Can travel by private vehicle   Yes    Equipment Recommendations None recommended by PT  Recommendations for Other Services       Functional Status Assessment Patient has had a recent decline in their functional status and demonstrates the ability to make significant improvements in function in a reasonable and predictable  amount of time.     Precautions / Restrictions Precautions Precautions: Fall Precaution/Restrictions Comments: watch BP Restrictions Weight Bearing Restrictions Per Provider Order: No      Mobility  Bed Mobility Overal bed mobility: Needs Assistance Bed Mobility: Supine to Sit, Sit to Supine     Supine to sit: Contact guard, Used rails, HOB elevated Sit to supine: Min assist   General bed mobility comments: able to come to EOB with use of rails. Pt not feeling well after standing up and then needed min A to get LE's back into bed    Transfers Overall transfer level: Needs assistance Equipment used: Rolling walker (2 wheels) Transfers: Sit to/from Stand Sit to Stand: Contact guard assist           General transfer comment: CGA for safety, pt tolerated ~10 seconds standing before having increased shaking and general feeling of being unwell. Was able to scoot along bedside with CGA. BP dropped to 98/52 and HR 125 bpm    Ambulation/Gait               General Gait Details: unable to advance ambulation due to symptomatic drop in BP  Stairs            Wheelchair Mobility     Tilt Bed    Modified Rankin (Stroke Patients Only)       Balance Overall balance assessment: Needs assistance Sitting-balance support: Feet supported Sitting balance-Leahy Scale: Good Sitting balance - Comments: felt ok in sitting before getting up   Standing balance support: Bilateral upper extremity supported, During functional activity Standing balance-Leahy Scale: Poor Standing balance comment: lmiited by shaking and dizziness  Pertinent Vitals/Pain Pain Assessment Pain Assessment: No/denies pain    Home Living Family/patient expects to be discharged to:: Private residence Living Arrangements: Children Available Help at Discharge: Family;Available 24 hours/day Type of Home: House Home Access: Stairs to enter Entrance  Stairs-Rails: None Entrance Stairs-Number of Steps: 2 Alternate Level Stairs-Number of Steps: stairs, landing, stairs Home Layout: Two level;Bed/bath upstairs;Able to live on main level with bedroom/bathroom Home Equipment: Rolling Walker (2 wheels);Rollator (4 wheels);Grab bars - toilet;Grab bars - tub/shower;Shower seat - built in Additional Comments: mainly stays on second floor for energy conservation    Prior Function Prior Level of Function : Needs assist             Mobility Comments: increased use of rollator recently, SPC intermittently ADLs Comments: occasional assist needed for bathing     Extremity/Trunk Assessment   Upper Extremity Assessment Upper Extremity Assessment: Defer to OT evaluation    Lower Extremity Assessment Lower Extremity Assessment: Generalized weakness    Cervical / Trunk Assessment Cervical / Trunk Assessment: Normal  Communication   Communication Communication: No apparent difficulties    Cognition Arousal: Alert Behavior During Therapy: Flat affect   PT - Cognitive impairments: Memory, Problem solving                       PT - Cognition Comments: pt could not remember what type of dog she has and reports that her head has felt foggy and heavy, does not seem like this is her baseline Following commands: Intact       Cueing Cueing Techniques: Verbal cues     General Comments General comments (skin integrity, edema, etc.): BP sitting 163/71, standing 98/52, sitting 127/73. HR up to 125 bpm, SPO2 stable in 90's on RA. MD to roder TED hose and abdominal binder    Exercises Other Exercises Other Exercises: AROM from feet to head before coming to sitting. Then AROM from feet to head in sitting before standing   Assessment/Plan    PT Assessment Patient needs continued PT services  PT Problem List Decreased strength;Decreased activity tolerance;Decreased mobility;Decreased balance;Decreased cognition;Decreased knowledge of  precautions;Decreased safety awareness;Cardiopulmonary status limiting activity       PT Treatment Interventions DME instruction;Gait training;Stair training;Functional mobility training;Therapeutic activities;Therapeutic exercise;Balance training;Patient/family education;Cognitive remediation;Neuromuscular re-education    PT Goals (Current goals can be found in the Care Plan section)  Acute Rehab PT Goals Patient Stated Goal: feel better PT Goal Formulation: With patient Time For Goal Achievement: 02/09/24 Potential to Achieve Goals: Good    Frequency Min 2X/week     Co-evaluation               AM-PAC PT 6 Clicks Mobility  Outcome Measure Help needed turning from your back to your side while in a flat bed without using bedrails?: None Help needed moving from lying on your back to sitting on the side of a flat bed without using bedrails?: A Little Help needed moving to and from a bed to a chair (including a wheelchair)?: A Little Help needed standing up from a chair using your arms (e.g., wheelchair or bedside chair)?: A Little Help needed to walk in hospital room?: Total Help needed climbing 3-5 steps with a railing? : Total 6 Click Score: 15    End of Session Equipment Utilized During Treatment: Gait belt Activity Tolerance: Treatment limited secondary to medical complications (Comment) (hypotension) Patient left: in bed;with call bell/phone within reach;with bed alarm set Nurse Communication: Mobility status;Other (comment) (  hypotension as well as chair brought into room to squat transfer to later today) PT Visit Diagnosis: Unsteadiness on feet (R26.81);Dizziness and giddiness (R42);Muscle weakness (generalized) (M62.81)    Time: 8956-8889 PT Time Calculation (min) (ACUTE ONLY): 27 min   Charges:   PT Evaluation $PT Eval Moderate Complexity: 1 Mod PT Treatments $Therapeutic Activity: 8-22 mins PT General Charges $$ ACUTE PT VISIT: 1 Visit          Richerd Lipoma, PT  Acute Rehab Services Secure chat preferred Office [phone]   Richerd CROME Ray Glacken 01/26/2024, 12:50 PM

## 2024-01-26 NOTE — Progress Notes (Signed)
 HD#1 SUBJECTIVE:  Patient Summary: Brenda Stephens is a 83 y.o. with a pertinent PMH of Brenda Stephens is a 89 y.o. with a pertinent PMH of Hypothyroidism, afib, GERD, dementia, diverticulous, rectal bleeding, and DVT presented today after experiencing a fall at home.   Overnight Events:   Interim History: ***  OBJECTIVE:  Vital Signs: Vitals:   01/25/24 2035 01/26/24 0431 01/26/24 0722 01/26/24 1528  BP: (!) 151/57 (!) 139/56 (!) 146/66 (!) 141/55  Pulse: (!) 101 75 95 86  Resp:  17 19 18   Temp: 98.7 F (37.1 C) 98.1 F (36.7 C) 99 F (37.2 C) 98.8 F (37.1 C)  TempSrc: Oral Oral Oral Oral  SpO2: 96% 98% 95% 95%  Weight:      Height:       Supplemental O2: {NAMES:3044014::Room Air,Nasal Cannula,Simple Face Mask,Partial Rebreather,HFNC,Non Rebreather,Venturi Mask,Bag Valve Mask} SpO2: 95 % O2 Flow Rate (L/min): 6 L/min  Filed Weights   01/24/24 0905 01/24/24 1322 01/25/24 0600  Weight: 83.9 kg 84.2 kg 88.7 kg    No intake or output data in the 24 hours ending 01/26/24 1823 Net IO Since Admission: 888.62 mL [01/26/24 1823]  Physical Exam: Physical Exam Physical Exam Lung: clear without crackle Heart: Tachycardic, Irregular Abdomen: No tenderness or rebounding Lower Ext: No swelling Radial Pulses: full   Patient Lines/Drains/Airways Status     Active Line/Drains/Airways     Name Placement date Placement time Site Days   Peripheral IV 01/24/24 18 G 1 Left Antecubital 01/24/24  0845  Antecubital  2            Pertinent labs and imaging:  ***    Latest Ref Rng & Units 01/26/2024    3:50 PM 01/26/2024    6:16 AM 01/25/2024    9:47 AM  CBC  WBC 4.0 - 10.5 K/uL  7.8  10.8   Hemoglobin 12.0 - 15.0 g/dL 7.3  7.3  7.6   Hematocrit 36.0 - 46.0 % 23.3  23.6  23.6   Platelets 150 - 400 K/uL  369  377        Latest Ref Rng & Units 01/26/2024    6:16 AM 01/25/2024    9:47 AM 01/24/2024    9:16 AM  CMP  Glucose 70 - 99 mg/dL 885  870  880    BUN 8 - 23 mg/dL 14  14  26    Creatinine 0.44 - 1.00 mg/dL 8.89  8.84  8.59   Sodium 135 - 145 mmol/L 138  141  136   Potassium 3.5 - 5.1 mmol/L 3.0  3.7  3.7   Chloride 98 - 111 mmol/L 110  113  105   CO2 22 - 32 mmol/L 19  17    Calcium  8.9 - 10.3 mg/dL 9.2  9.3      No results found.  ASSESSMENT/PLAN:  Assessment: Principal Problem:   Symptomatic anemia Active Problems:   Hypothyroidism   Iron  deficiency anemia   History of diverticulosis   Postural dizziness with presyncope   Anxiety and depression   Dementia without behavioral disturbance (HCC)   History of pulmonary embolism   History of DVT of lower extremity   PAF (paroxysmal atrial fibrillation) (HCC)   Hypercoagulable state due to paroxysmal atrial fibrillation (HCC)   Anemia due to GI blood loss   Chronic kidney disease, stage 3a (HCC)   Fall   Dysphagia   Colitis   Hypokalemia   Plan:  Iron  Deficiency Anemia GI  bleed The patient reports a month long history of bright red rectal bleeding.Lat bleeding was yesterday, light massive bleeding, denies melena. Rectal bleeding, thought to be related to hemorrhoids,  EGD and Colonoscopy have done on 7/28:  EGD: No endoscopic abnormalities were found in the esophagus, stomach, or duodenum to explain the patient's dysphagia. Biopsies from the proximal and distal esophagus were taken to evaluate for eosinophilic esophagitis. Colonoscopy: Non-thrombosed external hemorrhoids and internal hemorrhoids that prolapse with straining, but spontaneously regress to the resting position (Grade II) found on digital rectal exam. Diverticulosis in the sigmoid colon,One 4 mm polyp in the descending colon, removed.     -Hgb 7.3 from 7.6 - Start Iron  supplement (Ferritin 6 and Saturation ratio's 2) -Protonix  40mg  -Transfuse if Hgb<7 -High fiber diet. -Hold Eliquise -start Prepration H and  F/U with General Surgery for hemorrhoid   Falls Postural dizziness with  presyncope Chronic Pain The patient admits to frequently falling at home, the las fall was not because of lightheadedness but she had hx of lightheadedness before. She endorses feeling weak with her muscles and also complaining of pain after her fall and she had to crawl to move. She also reports getting up and feeling light headed frequently.  - Held Metoprolol  due to orthostatic hypotension -PT/OT and check orthostatic hypotension or episode of dizziness while ambulating -restart her home Tramadol  (taking chronically). -Tylenol  1000mg  q6 -1mg  Dilaudid  PO q4 PRN     Esophageal dysphagia   for the long time, nonprogressive, not associated with weight loss,  Worse with solids>liquids.  There is no pain in swallowing just like feeling something stuck.  EGD on 7/28: No endoscopic abnormalities were found in the esophagus, stomach, or duodenum to explain the patient's dysphagia. Biopsies from the proximal and distal esophagus were taken to evaluate for eosinophilic esophagitis. -resume a regular diet -await pathology results     Hypokalemia:  Potasium today is 3 - started 40 meq potassium chloride   -check BMP and Magnesium    Elevated Scr CKD Stage 3a Improving, BUN 14 with Cr 1.1 . Baseline variable, but ~1.2. -Pt volume down on admission, also with chronic blood loss from GI Bleed       Urinary Retention -Pt on flomax  and oxybutinin at home. Held medication here. -Pt has Purewick and good urination   A-fib Hx of DVT Hx of PE Patient has a history of atrial fibrillation on Eliquis  along with metoprolol  tartrate for rate control.She was tachycardic but in normal rhythm.  - keep Holding Eliquis  due to active bleeding (Last dose of Eliquis  was administered on 7/26.) -Metoprolol  stopped due to episode of orthostatic hypotension, but she had palpitation possibly due to volume loss (rectal bleeding) and holding metoprolol      Dementia -Continue home Donepezil , Memantine , and seroquel   at bedtime     Best Practice: Diet: Regular diet IVF: None VTE: SCDs Start: 01/24/24 1216 Code: DNR   Disposition planning: DME: walker Family Contact: Daughter, called and notified. DISPO: Anticipated discharge in 2 days to home pending for clinical improvement.   Signature:  Aadi Bordner [facility]  6:23 PM, 01/26/2024  On Call pager [phone]

## 2024-01-27 DIAGNOSIS — K649 Unspecified hemorrhoids: Secondary | ICD-10-CM

## 2024-01-27 LAB — BASIC METABOLIC PANEL WITH GFR
Anion gap: 8 (ref 5–15)
BUN: 14 mg/dL (ref 8–23)
CO2: 17 mmol/L — ABNORMAL LOW (ref 22–32)
Calcium: 9.2 mg/dL (ref 8.9–10.3)
Chloride: 113 mmol/L — ABNORMAL HIGH (ref 98–111)
Creatinine, Ser: 1.16 mg/dL — ABNORMAL HIGH (ref 0.44–1.00)
GFR, Estimated: 47 mL/min — ABNORMAL LOW (ref >60)
Glucose, Bld: 111 mg/dL — ABNORMAL HIGH (ref 70–99)
Potassium: 3.7 mmol/L (ref 3.5–5.1)
Sodium: 138 mmol/L (ref 135–145)

## 2024-01-27 LAB — HEMOGLOBIN AND HEMATOCRIT, BLOOD
HCT: 25.1 % — ABNORMAL LOW (ref 36.0–46.0)
Hemoglobin: 7.8 g/dL — ABNORMAL LOW (ref 12.0–15.0)

## 2024-01-27 LAB — CBC
HCT: 22.4 % — ABNORMAL LOW (ref 36.0–46.0)
Hemoglobin: 7 g/dL — ABNORMAL LOW (ref 12.0–15.0)
MCH: 27.2 pg (ref 26.0–34.0)
MCHC: 31.3 g/dL (ref 30.0–36.0)
MCV: 87.2 fL (ref 80.0–100.0)
Platelets: 356 K/uL (ref 150–400)
RBC: 2.57 MIL/uL — ABNORMAL LOW (ref 3.87–5.11)
RDW: 14.4 % (ref 11.5–15.5)
WBC: 8.5 K/uL (ref 4.0–10.5)
nRBC: 0.2 % (ref 0.0–0.2)

## 2024-01-27 LAB — SURGICAL PATHOLOGY

## 2024-01-27 NOTE — Progress Notes (Signed)
 Occupational Therapy Treatment Patient Details Name: Brenda Stephens MRN: [id number] DOB: April 21, 1941 Today's Date: 01/27/2024   History of present illness Brenda Stephens is a 83 yo female who presented after a fall at home. She also reports frequent light headedness and rectal bleeding. PMH: hypertension, hyperlipidemia, hypothyroidism, GERD, A-fib, diastolic dysfunction, anxiety, depression, dementia, breast cancer, PE, DVT, asthmatic bronchitis, anemia   OT comments  Pt is making great progress towards their acute OT goals. Pt was able to complete all aspects of mobility with RW and superivsion A, no dizziness or LOB observed. She completed toileting and LB ADLs with up to CGA and increased time. OT to continue to follow acutely to facilitate progress towards established goals. Pt will continue to benefit from [facility]       If plan is discharge home, recommend the following:  A little help with walking and/or transfers;A lot of help with bathing/dressing/bathroom;Direct supervision/assist for medications management;Direct supervision/assist for financial management;Assist for transportation;Help with stairs or ramp for entrance;Supervision due to cognitive status   Equipment Recommendations  BSC/3in1       Precautions / Restrictions Precautions Precautions: Fall Precaution/Restrictions Comments: watch BP Restrictions Weight Bearing Restrictions Per Provider Order: No       Mobility Bed Mobility Overal bed mobility: Needs Assistance Bed Mobility: Sit to Supine, Supine to Sit     Supine to sit: Supervision Sit to supine: Supervision        Transfers Overall transfer level: Needs assistance Equipment used: Rolling walker (2 wheels) Transfers: Sit to/from Stand Sit to Stand: Supervision           General transfer comment: no dizziness throughout     Balance Overall balance assessment: Needs assistance Sitting-balance support: Feet supported Sitting balance-Leahy  Scale: Good     Standing balance support: Bilateral upper extremity supported, During functional activity Standing balance-Leahy Scale: Poor Standing balance comment: lmiited by shaking and dizziness                           ADL either performed or assessed with clinical judgement   ADL Overall ADL's : Needs assistance/impaired                         Toilet Transfer: Supervision/safety;Ambulation;BSC/3in1;Rolling walker (2 wheels)   Toileting- Clothing Manipulation and Hygiene: Contact guard assist;Sit to/from stand       Functional mobility during ADLs: Contact guard assist;Rolling walker (2 wheels) General ADL Comments: no dizziness    Extremity/Trunk Assessment Upper Extremity Assessment Upper Extremity Assessment: Generalized weakness   Lower Extremity Assessment Lower Extremity Assessment: Defer to PT evaluation        Vision   Vision Assessment?: No apparent visual deficits   Perception Perception Perception: Within Functional Limits   Praxis Praxis Praxis: WFL   Communication Communication Communication: No apparent difficulties   Cognition Arousal: Alert Behavior During Therapy: WFL for tasks assessed/performed Cognition: No family/caregiver present to determine baseline, History of cognitive impairments             OT - Cognition Comments: Overall WFL for basic command following                 Following commands: Intact        Cueing   Cueing Techniques: Verbal cues  Exercises      Shoulder Instructions       General Comments VSS throughout, BM on BSC  Pertinent Vitals/ Pain       Pain Assessment Pain Assessment: No/denies pain  Home Living                                          Prior Functioning/Environment              Frequency  Min 2X/week        Progress Toward Goals  OT Goals(current goals can now be found in the care plan section)  Progress towards OT  goals: Progressing toward goals  Acute Rehab OT Goals Patient Stated Goal: to go home OT Goal Formulation: With patient Time For Goal Achievement: 02/08/24 Potential to Achieve Goals: Good ADL Goals Pt Will Perform Lower Body Dressing: with modified independence;sit to/from stand Pt Will Transfer to Toilet: with modified independence;ambulating Additional ADL Goal #1: Pt will indep recall at least 3 fall prevention strategies to reduce fall risk at discharge Additional ADL Goal #2: pt will tolerate at least 5 minutes of OOB funtional activity to demonstrate improved endurace for ADLs  Plan      Co-evaluation                 AM-PAC OT 6 Clicks Daily Activity     Outcome Measure   Help from another person eating meals?: None Help from another person taking care of personal grooming?: A Little Help from another person toileting, which includes using toliet, bedpan, or urinal?: A Little Help from another person bathing (including washing, rinsing, drying)?: A Little Help from another person to put on and taking off regular upper body clothing?: A Little Help from another person to put on and taking off regular lower body clothing?: A Little 6 Click Score: 19    End of Session Equipment Utilized During Treatment: Rolling walker (2 wheels)  OT Visit Diagnosis: Unsteadiness on feet (R26.81);Other abnormalities of gait and mobility (R26.89);Repeated falls (R29.6);Muscle weakness (generalized) (M62.81);History of falling (Z91.81);Pain;Dizziness and giddiness (R42)   Activity Tolerance Patient tolerated treatment well   Patient Left in bed;with bed alarm set;with call bell/phone within reach   Nurse Communication Mobility status        Time: 8970-8948 OT Time Calculation (min): 22 min  Charges: OT General Charges $OT Visit: 1 Visit OT Treatments $Self Care/Home Management : 8-22 mins  Lucie Kendall, OTR/L Acute Rehabilitation Services Office [phone] Secure  Chat Communication Preferred   Lucie JONETTA Kendall 01/27/2024, 1:19 PM

## 2024-01-27 NOTE — Progress Notes (Signed)
 HD#2 SUBJECTIVE:  Patient Summary:Brenda Stephens is a 83 y.o. with a pertinent PMH of Brenda Stephens is a 55 y.o. with a pertinent PMH of Hypothyroidism, afib, GERD, dementia, diverticulous, rectal bleeding, and DVT presented today after experiencing a fall at home. .   Overnight Events: N/A  Interim History:   Patient is doing well overall. Positive bloody bowl movement.No shortness of breath or dizziness reported. She is unsure how much blood was present in her stool. Physical therapy went well yesterday, and she may attempt to use the bathroom independently today. She did not feel dizzy when sitting up. She drank Ensure but did not like the taste. Encouraged ambulation.  Cardiac and pulmonary exams were unremarkable. She is medically stable for discharge.   OBJECTIVE:  Vital Signs: Vitals:   01/26/24 2017 01/27/24 0330 01/27/24 0820 01/27/24 1551  BP: 131/63 (!) 154/57 (!) 162/71 (!) 142/62  Pulse: 90 79 81 85  Resp:   18 18  Temp: 98 F (36.7 C) 98.4 F (36.9 C) 98 F (36.7 C) 97.8 F (36.6 C)  TempSrc: Oral Oral Oral Oral  SpO2: 99% 97% 98% 99%  Weight:      Height:       Supplemental O2: Room Air SpO2: 99 % O2 Flow Rate (L/min): 6 L/min  Filed Weights   01/24/24 0905 01/24/24 1322 01/25/24 0600  Weight: 83.9 kg 84.2 kg 88.7 kg     Intake/Output Summary (Last 24 hours) at 01/27/2024 1817 Last data filed at 01/27/2024 1000 Gross per 24 hour  Intake 118 ml  Output --  Net 118 ml   Net IO Since Admission: 1,006.62 mL [01/27/24 1817]  Physical Exam: Physical Exam Lung: clear without crackle Heart: Tachycardic, Irregular Abdomen: No tenderness or rebounding Lower Ext: No swelling Radial Pulses: full   Patient Lines/Drains/Airways Status     Active Line/Drains/Airways     Name Placement date Placement time Site Days   Peripheral IV 01/24/24 18 G 1 Left Antecubital 01/24/24  0845  Antecubital  3            Pertinent labs and imaging:       Latest Ref Rng & Units 01/27/2024    3:01 PM 01/27/2024    5:27 AM 01/26/2024    3:50 PM  CBC  WBC 4.0 - 10.5 K/uL  8.5    Hemoglobin 12.0 - 15.0 g/dL 7.8  7.0  7.3   Hematocrit 36.0 - 46.0 % 25.1  22.4  23.3   Platelets 150 - 400 K/uL  356         Latest Ref Rng & Units 01/27/2024    5:27 AM 01/26/2024    6:16 AM 01/25/2024    9:47 AM  CMP  Glucose 70 - 99 mg/dL 888  885  870   BUN 8 - 23 mg/dL 14  14  14    Creatinine 0.44 - 1.00 mg/dL 8.83  8.89  8.84   Sodium 135 - 145 mmol/L 138  138  141   Potassium 3.5 - 5.1 mmol/L 3.7  3.0  3.7   Chloride 98 - 111 mmol/L 113  110  113   CO2 22 - 32 mmol/L 17  19  17    Calcium  8.9 - 10.3 mg/dL 9.2  9.2  9.3     No results found.  ASSESSMENT/PLAN:  Assessment: Principal Problem:   Symptomatic anemia Active Problems:   Hypothyroidism   Iron  deficiency anemia   History of diverticulosis  Postural dizziness with presyncope   Anxiety and depression   Dementia without behavioral disturbance (HCC)   History of pulmonary embolism   History of DVT of lower extremity   PAF (paroxysmal atrial fibrillation) (HCC)   Hypercoagulable state due to paroxysmal atrial fibrillation (HCC)   Anemia due to GI blood loss   Chronic kidney disease, stage 3a (HCC)   Fall   Dysphagia   Colitis   Hypokalemia   Hemorrhoids   Plan:  Iron  Deficiency Anemia GI bleed 2 bloody Rectal bleeding related to hemorrhoids last night. EGD: No endoscopic abnormalities were found in the esophagus, stomach, or duodenum to explain the patient's dysphagia. Biopsies from the proximal and distal esophagus were taken to evaluate for eosinophilic esophagitis. Colonoscopy: Non-thrombosed external hemorrhoids and internal hemorrhoids that prolapse with straining, but spontaneously regress to the resting position (Grade II) found on digital rectal exam. Diverticulosis in the sigmoid colon,One 4 mm polyp in the descending colon, removed.   -Hgb  7 -Iron  supplement (Ferritin  6 and Saturation ratio's 2) -Protonix  40mg  -Transfuse if Hgb<7 -continue holding Eliquise -Prepration H and  Sitz Bath -Gen Surg consult: will visit patient   Falls Postural dizziness with presyncope Chronic Pain Improved.  -PT/OT -home health OT/PT -DME bedside commode -Tylenol  1000mg  q8 -1mg  Dilaudid  PO q4 PRN   Esophageal dysphagia Improved. EGD on 7/28: No endoscopic abnormalities were found in the esophagus, stomach, or duodenum to explain the patient's dysphagia. Biopsies from the proximal and distal esophagus were taken to evaluate for eosinophilic esophagitis. -regular diet -await pathology results     Hypokalemia: Potasium today is 3 , Mg: 2.1 -potasium 40 meq BID for 2 doses   Elevated Scr CKD Stage 3a Resolved.. BUN 14 with Cr 1.10 . Baseline variable, but ~1.2. -Pt volume down on admission, also with chronic blood loss from GI Bleed     Urinary Retention -Pt on flomax  and oxybutinin at home. Held medication here. -Pt has Purewick and good urination   A-fib Hx of DVT Hx of PE Patient has a history of atrial fibrillation on Eliquis  along with metoprolol  tartrate for rate control. tachycardic but in normal rhythm.  - keep Holding Eliquis  due to active bleeding (Last dose of Eliquis  was administered on 7/26.) -Metoprolol  25 mg started  -Tolerated compression stockings, will discharge with a pair.   Dementia -Continue home Donepezil , Memantine , and seroquel  at bedtime   PT: Pt is making great progress towards their acute OT goals. Pt was able to complete all aspects of mobility with RW and superivsion A, no dizziness or LOB observed. She completed toileting and LB ADLs with up to CGA and increased time  Rolling walker (2 wheels) CSW completed SNF workup with pt. Pt stated she would rather go home with home health instead of to facility.    Best Practice: Diet: Regular diet IVF: None VTE: SCDs Start: 01/24/24 1216 Code: DNR   Disposition  planning: DME: walker Family Contact: Daughter, called and notified. DISPO: Anticipated discharge in 2 days to home pending for clinical improvement. Signature:  Malayjah Otoole [facility]  6:17 PM, 01/27/2024  On Call pager [phone]

## 2024-01-27 NOTE — TOC CM/SW Note (Signed)
    Durable Medical Equipment  (From admission, onward)           Start     Ordered   01/27/24 1541  For home use only DME Bedside commode  Once       Question:  Patient needs a bedside commode to treat with the following condition  Answer:  Fall   01/27/24 1541           Patient is not able to walk the distance required to go to the bathroom due to limited mobility . A bedside commode will alleviate this problem and help to reduce/prevent the risk of falls.

## 2024-01-27 NOTE — TOC Progression Note (Signed)
 Transition of Care [facility]) - Progression Note    Patient Details  Name: Brenda Stephens MRN: [id number] Date of Birth: [birth year]  [facility] CM/SW Contact  Aeriel Boulay LITTIE Moose, LCSW Phone Number: 01/27/2024, 12:01 PM  Clinical Narrative:    CSW completed SNF workup with pt. Pt stated she would rather go home with home health instead of to facility.                      Expected Discharge Plan and Services                                               Social Drivers of Health (SDOH) Interventions SDOH Screenings   Food Insecurity: No Food Insecurity (01/24/2024)  Housing: Low Risk  (01/24/2024)  Transportation Needs: No Transportation Needs (01/24/2024)  Utilities: Not At Risk (01/24/2024)  Financial Resource Strain: Low Risk  (03/12/2021)   Received from [facility] visits prior to 08/30/2022.  Physical Activity: Inactive (03/12/2021)   Received from [facility] visits prior to 08/30/2022.  Social Connections: Moderately Integrated (01/24/2024)  Stress: Stress Concern Present (03/12/2021)   Received from [facility] visits prior to 08/30/2022.  Tobacco Use: Medium Risk (01/25/2024)    Readmission Risk Interventions     No data to display

## 2024-01-27 NOTE — Plan of Care (Signed)

## 2024-01-27 NOTE — TOC Initial Note (Addendum)
 Transition of Care (TOC) - Initial/Assessment Note   Received secure chat , patient prefers to go home at discharge instead of SNF for short term rehab. Patient did better today with PT , she did not get dizzy and ambulated hallway distance . Talked to patient at bedside, confirmed same.   Patient from home with daughter.   Patient has walker, cane and shower chair at bedside .   Provided medicare.gov list of home health agencies to patient. Patient has had [facility] in past. Patient stated she has no preference .   NCM messaged Darleene with [facility] await call back.   Secure chatted attending team. Will need orders and face to face   OT recommending HHOT and bedside commode. Patient in agreement. Messaged Attending team for order and bedside commode progress note  Patient Details  Name: Brenda Stephens MRN: [id number] Date of Birth: March 14, 1941  Transition of Care [facility]) CM/SW Contact:    Stephane Powell Jansky, RN Phone Number: 01/27/2024, 12:45 PM  Clinical Narrative:                   Expected Discharge Plan: Home w Home Health Services Barriers to Discharge: Continued Medical Work up   Patient Goals and CMS Choice Patient states their goals for this hospitalization and ongoing recovery are:: to return to home CMS Medicare.gov Compare Post Acute Care list provided to:: Patient Choice offered to / list presented to : Patient      Expected Discharge Plan and Services   Discharge Planning Services: CM Consult Post Acute Care Choice: Home Health Living arrangements for the past 2 months: Single Family Home                 DME Arranged: N/A DME Agency: NA       HH Arranged: PT, OT HH Agency: [facility] Date [facility] Agency Contacted: 01/27/24 Time HH Agency Contacted: [phone] Representative spoke with at [facility] Agency: Darleene  Prior Living Arrangements/Services Living arrangements for the past 2 months: Single Family Home Lives with:: Adult Children Patient language and need  for interpreter reviewed:: Yes Do you feel safe going back to the place where you live?: Yes      Need for Family Participation in Patient Care: Yes (Comment) Care giver support system in place?: Yes (comment) Current home services: DME Criminal Activity/Legal Involvement Pertinent to Current Situation/Hospitalization: No - Comment as needed  Activities of Daily Living   ADL Screening (condition at time of admission) Independently performs ADLs?: No Does the patient have a NEW difficulty with bathing/dressing/toileting/self-feeding that is expected to last >3 days?: Yes (Initiates electronic notice to provider for possible OT consult) Does the patient have a NEW difficulty with getting in/out of bed, walking, or climbing stairs that is expected to last >3 days?: Yes (Initiates electronic notice to provider for possible PT consult) Does the patient have a NEW difficulty with communication that is expected to last >3 days?: Yes (Initiates electronic notice to provider for possible SLP consult) Is the patient deaf or have difficulty hearing?: No Does the patient have difficulty seeing, even when wearing glasses/contacts?: No Does the patient have difficulty concentrating, remembering, or making decisions?: No  Permission Sought/Granted   Permission granted to share information with : Yes, Verbal Permission Granted     Permission granted to share info w AGENCY: [facility]        Emotional Assessment Appearance:: Appears stated age Attitude/Demeanor/Rapport: Engaged Affect (typically observed): Appropriate Orientation: : Oriented to Self,  Oriented to Place, Oriented to  Time, Oriented to Situation Alcohol  / Substance Use: Not Applicable Psych Involvement: No (comment)  Admission diagnosis:  Fall, initial encounter [W19.XXXA] Symptomatic anemia [D64.9] Colitis [K52.9] Patient Active Problem List   Diagnosis Date Noted   Hemorrhoids 01/27/2024   Hypokalemia 01/26/2024   Colitis  01/25/2024   Hypercoagulable state due to paroxysmal atrial fibrillation (HCC) 01/24/2024   Anemia due to GI blood loss 01/24/2024   Symptomatic anemia 01/24/2024   Fall 01/24/2024   Dysphagia 01/24/2024   Aortic root enlargement (HCC) 11/25/2023   Precordial chest pain 11/25/2023   Near syncope 11/21/2023   PAF (paroxysmal atrial fibrillation) (HCC) 09/27/2023   Elevated TSH 09/27/2023   Rectal bleed 07/09/2023   Current use of long term anticoagulation 07/09/2023   Rapid atrial fibrillation (HCC) 02/11/2023   History of pulmonary embolism 02/11/2023   History of DVT of lower extremity 02/11/2023   Grade I diastolic dysfunction 07/15/2022   Anxiety and depression 07/02/2022   Dementia without behavioral disturbance (HCC) 07/02/2022   HX: breast cancer 07/02/2022   Chronic kidney disease, stage 3a (HCC) 04/23/2022   Right lower lobe pulmonary nodule 10/24/2021   Palpitations 01/03/2021   Cerebrovascular disease 04/25/2020   DOE (dyspnea on exertion) 10/12/2019   Rotator cuff arthropathy, left 01/12/2019   Postural dizziness with presyncope 02/04/2018   Leg swelling 12/17/2017   Asthmatic bronchitis, mild intermittent, uncomplicated 08/04/2017   Malignant neoplasm of upper-outer quadrant of right breast in female, estrogen receptor positive (HCC) 03/13/2017   Acute chest pain 05/10/2013   Fasting hyperglycemia 06/13/2011   Hypothyroidism 05/06/2010   Hyperlipidemia 05/06/2010   ARTHRITIS, SHOULDER 05/06/2010   FATIGUE, CHRONIC 05/06/2010   Seasonal and perennial allergic rhinitis 03/14/2010   Iron  deficiency anemia 10/29/2009   Essential hypertension 10/29/2009   GERD 10/29/2009   History of diverticulosis 10/29/2009   UMBILICAL HERNIA, HX OF 10/29/2009   PCP:  Loris Elsie PARAS, PA-C Pharmacy:   [facility] [place], [place] - [address] [facility] [address] [place] [address] Phone: [phone] Fax: [phone]  DARRYLE LONG -  [facility] [address] [place] [address] Phone: [phone] Fax: [phone]  [id number] [place], [place] - [address] [place] [address] Phone: [phone] Fax: [phone]     Social Drivers of Health (SDOH) Social History: SDOH Screenings   Food Insecurity: No Food Insecurity (01/24/2024)  Housing: Low Risk  (01/24/2024)  Transportation Needs: No Transportation Needs (01/24/2024)  Utilities: Not At Risk (01/24/2024)  Financial Resource Strain: Low Risk  (03/12/2021)   [facility] visits prior to 08/30/2022.  Physical Activity: Inactive (03/12/2021)   Received from [facility] visits prior to 08/30/2022.  Social Connections: Moderately Integrated (01/24/2024)  Stress: Stress Concern Present (03/12/2021)   [facility] from [facility] visits prior to 08/30/2022.  Tobacco Use: Medium Risk (01/25/2024)   SDOH Interventions: Food Insecurity Interventions: Intervention Not Indicated Housing Interventions: Intervention Not Indicated Transportation Interventions: Intervention Not Indicated Utilities Interventions: Intervention Not Indicated Social Connections Interventions: Intervention Not Indicated   Readmission Risk Interventions     No data to display

## 2024-01-27 NOTE — Progress Notes (Signed)
 Physical Therapy Treatment Patient Details Name: Brenda Stephens MRN: [id number] DOB: [birth year] Today's Date: 01/27/2024   History of Present Illness Brenda Stephens is a 83 yo female who presented after a fall at home. She also reports frequent light headedness and rectal bleeding. PMH: hypertension, hyperlipidemia, hypothyroidism, GERD, A-fib, diastolic dysfunction, anxiety, depression, dementia, breast cancer, PE, DVT, asthmatic bronchitis, anemia    PT Comments  Pt resting in bed on arrival and agreeable to session with good progress towards acute goals. BP stable with all positional changes, (see vitals flowsheet). Pt demonstrating bed mobility, transfers and gait with grossly supervision for safety. Pt was educated on continued walker use to maximize functional independence, safety, and decrease risk for falls as well as taking time between positional changes and monitoring symptoms prior to mobility. Discussed with supervising PT and updated recommendation as pt making great progress and will benefit from HHPT follow up post acutely. Pt continues to benefit from skilled PT services to progress toward functional mobility goals.      If plan is discharge home, recommend the following: A little help with bathing/dressing/bathroom;Assistance with cooking/housework;Assist for transportation;Help with stairs or ramp for entrance;A little help with walking and/or transfers   Can travel by private vehicle        Equipment Recommendations  None recommended by PT    Recommendations for Other Services       Precautions / Restrictions Precautions Precautions: Fall Precaution/Restrictions Comments: watch BP Restrictions Weight Bearing Restrictions Per Provider Order: No     Mobility  Bed Mobility Overal bed mobility: Needs Assistance Bed Mobility: Supine to Sit     Supine to sit: Supervision     General bed mobility comments: supervision for safety    Transfers Overall transfer  level: Needs assistance Equipment used: Rolling walker (2 wheels) Transfers: Sit to/from Stand Sit to Stand: Supervision           General transfer comment: no dizziness throughout    Ambulation/Gait Ambulation/Gait assistance: Supervision Gait Distance (Feet): 150 Feet Assistive device: Rolling walker (2 wheels) Gait Pattern/deviations: Step-through pattern Gait velocity: decr     General Gait Details: steady gait with RW for supprot   Stairs             Wheelchair Mobility     Tilt Bed    Modified Rankin (Stroke Patients Only)       Balance Overall balance assessment: Needs assistance Sitting-balance support: Feet supported Sitting balance-Leahy Scale: Good     Standing balance support: Bilateral upper extremity supported, During functional activity Standing balance-Leahy Scale: Poor Standing balance comment: reliant on RW for support                            Communication Communication Communication: No apparent difficulties  Cognition Arousal: Alert Behavior During Therapy: WFL for tasks assessed/performed                             Following commands: Intact      Cueing Cueing Techniques: Verbal cues  Exercises      General Comments General comments (skin integrity, edema, etc.): BP stable, see vitals flowsheet      Pertinent Vitals/Pain Pain Assessment Pain Assessment: No/denies pain    Home Living                          Prior  Function            PT Goals (current goals can now be found in the care plan section) Acute Rehab PT Goals PT Goal Formulation: With patient Time For Goal Achievement: 02/09/24 Progress towards PT goals: Progressing toward goals    Frequency    Min 2X/week      PT Plan      Co-evaluation              AM-PAC PT 6 Clicks Mobility   Outcome Measure  Help needed turning from your back to your side while in a flat bed without using bedrails?:  None Help needed moving from lying on your back to sitting on the side of a flat bed without using bedrails?: A Little Help needed moving to and from a bed to a chair (including a wheelchair)?: A Little Help needed standing up from a chair using your arms (e.g., wheelchair or bedside chair)?: A Little Help needed to walk in hospital room?: A Little Help needed climbing 3-5 steps with a railing? : A Little 6 Click Score: 19    End of Session   Activity Tolerance: Patient tolerated treatment well Patient left: in chair;with call bell/phone within reach Nurse Communication: Mobility status PT Visit Diagnosis: Unsteadiness on feet (R26.81);Dizziness and giddiness (R42);Muscle weakness (generalized) (M62.81)     Time: 8898-8880 PT Time Calculation (min) (ACUTE ONLY): 18 min  Charges:    $Gait Training: 8-22 mins PT General Charges $$ ACUTE PT VISIT: 1 Visit                     Dorsey Charette R. PTA Acute Rehabilitation Services Office: [phone]    Brenda Stephens 01/27/2024, 4:00 PM

## 2024-01-28 LAB — BASIC METABOLIC PANEL WITH GFR
Anion gap: 8 (ref 5–15)
BUN: 15 mg/dL (ref 8–23)
CO2: 19 mmol/L — ABNORMAL LOW (ref 22–32)
Calcium: 9.2 mg/dL (ref 8.9–10.3)
Chloride: 113 mmol/L — ABNORMAL HIGH (ref 98–111)
Creatinine, Ser: 1.02 mg/dL — ABNORMAL HIGH (ref 0.44–1.00)
GFR, Estimated: 55 mL/min — ABNORMAL LOW (ref >60)
Glucose, Bld: 112 mg/dL — ABNORMAL HIGH (ref 70–99)
Potassium: 3.5 mmol/L (ref 3.5–5.1)
Sodium: 140 mmol/L (ref 135–145)

## 2024-01-28 LAB — CBC
HCT: 22.9 % — ABNORMAL LOW (ref 36.0–46.0)
Hemoglobin: 7 g/dL — ABNORMAL LOW (ref 12.0–15.0)
MCH: 27.5 pg (ref 26.0–34.0)
MCHC: 30.6 g/dL (ref 30.0–36.0)
MCV: 89.8 fL (ref 80.0–100.0)
Platelets: 335 K/uL (ref 150–400)
RBC: 2.55 MIL/uL — ABNORMAL LOW (ref 3.87–5.11)
RDW: 14.6 % (ref 11.5–15.5)
WBC: 9.6 K/uL (ref 4.0–10.5)
nRBC: 0 % (ref 0.0–0.2)

## 2024-01-28 LAB — HEMOGLOBIN AND HEMATOCRIT, BLOOD
HCT: 22.8 % — ABNORMAL LOW (ref 36.0–46.0)
Hemoglobin: 7 g/dL — ABNORMAL LOW (ref 12.0–15.0)

## 2024-01-28 MED ORDER — VITAMIN C 500 MG PO TABS
1000.0000 mg | ORAL_TABLET | Freq: Every day | ORAL | Status: DC
  Administered 2024-01-28 – 2024-01-29 (×2): 1000 mg via ORAL
  Filled 2024-01-28 (×2): qty 2

## 2024-01-28 MED ORDER — HYDROCORTISONE ACETATE 25 MG RE SUPP
25.0000 mg | Freq: Two times a day (BID) | RECTAL | Status: DC
  Administered 2024-01-28 – 2024-01-29 (×2): 25 mg via RECTAL
  Filled 2024-01-28 (×3): qty 1

## 2024-01-28 MED ORDER — POLYETHYLENE GLYCOL 3350 17 G PO PACK
17.0000 g | PACK | Freq: Two times a day (BID) | ORAL | Status: DC
  Administered 2024-01-28 – 2024-01-29 (×2): 17 g via ORAL
  Filled 2024-01-28 (×2): qty 1

## 2024-01-28 MED ORDER — METOPROLOL SUCCINATE ER 25 MG PO TB24
25.0000 mg | ORAL_TABLET | Freq: Every day | ORAL | Status: DC
  Administered 2024-01-28: 25 mg via ORAL
  Filled 2024-01-28: qty 1

## 2024-01-28 MED ORDER — POTASSIUM CHLORIDE CRYS ER 20 MEQ PO TBCR
40.0000 meq | EXTENDED_RELEASE_TABLET | Freq: Two times a day (BID) | ORAL | Status: AC
  Administered 2024-01-28 (×2): 40 meq via ORAL
  Filled 2024-01-28 (×2): qty 2

## 2024-01-28 NOTE — Consult Note (Signed)
 Brenda Stephens 1941/05/08  [id number].    Requesting MD: Dr. Armando Rossetti Chief Complaint/Reason for Consult: Internal hemorrhoids   HPI: Brenda Stephens is a 83 y.o. female with a history of dementia, A. Fib/DVT/PE on Eliquis  (last dose 7/26) who we are asked to see for lower GI bleed felt to be secondary to internal hemorrhoids.  Patient presented on 7/27 after a fall at home.  During workup she was found to have anemia and complaints of BRBPR.  Her Eliquis  was held, she was admitted to internal medicine and GI was consulted.  Upper Endoscopy 01/25/24 - No endoscopic esophageal abnormality to explain patient' s dysphagia.  - Normal stomach.  - Normal examined duodenum.  - Biopsies were taken with a cold forceps for evaluation of eosinophilic esophagitis.  Colonoscopy 01/25/24 - Non- thrombosed external hemorrhoids and internal hemorrhoids that prolapse with straining, but spontaneously regress to the resting position (Grade II) found on digital rectal exam.  - Diverticulosis in the sigmoid colon. - One 4 mm polyp in the descending colon, removed with a cold snare. Resected and retrieved. --> path with Tubular adenoma, Negative for high-grade dysplasia and carcinoma   He is currently being treated with 325 mg iron  daily for anemia, Senokot-S nightly for bowel regimen, phenylephrine  suppositories twice daily and 3 times daily sitz bath.  She is currently hemodynamically stable without tachycardia or systolic hypotension.  CBC Hemoglobins trend 7.9 --> 7.6 --> 7.3 --> 7 --> 7 (hgb stable at 7 from this morning's labs). Baseline hgb ~10 on prior labs.   She reports she has been dealing with hemorrhoids for many years.  She does have intermittent flares of bloody stools when she becomes constipated.  She reports baseline constipation but is not sure what she takes for this.  She did have bloody stools on admission per her report which have resolved today with her last bowel movement  nonbloody.  She denies any pain to the area.  She reports she has never had any surgeries done for hemorrhoids. She is currently on a regular diet and tolerating without abdominal pain, n/v.   To note, she had DVT studies on 02/12/23 that showed no evidence of DVT in BLE. She had a CTA chest done 11/21/23 that showed no significant PE.   ROS: ROS As above, see hpi  Family History  Problem Relation Age of Onset   Hiatal hernia Mother    Heart attack Mother 35   Heart disease Father 81       CAD @ autopsy   Cancer Father        renal cancer   Heart attack Brother 26       fatal   Diabetes Neg Hx    Stroke Neg Hx     Past Medical History:  Diagnosis Date   Acute deep vein thrombosis (DVT) of proximal vein of right lower extremity (HCC) 07/02/2022   Acute pulmonary embolism (HCC) 07/15/2022   AKI (acute kidney injury) (HCC) 02/16/2023   Allergy    latex   Anemia    Anxiety    Arthritis    Atrial fibrillation with RVR (HCC) 02/12/2023   Bilateral pulmonary embolism (HCC) 06/30/2022   Cancer (HCC) 01/2017   right breast cancer   Depression    Dyslipidemia    Eustachian tube dysfunction    GERD (gastroesophageal reflux disease)    Grade I diastolic dysfunction 07/15/2022   Hx of cardiovascular stress test    Myoview  10/13:  apical thinning, EF 72%, no ischemia   Hx of colonic polyps    Dr Rosalie   Hypertension    Hypothyroidism    Other abnormal glucose    Pulmonary embolism (HCC) 06/30/2022    Past Surgical History:  Procedure Laterality Date   ABDOMINAL HYSTERECTOMY     BSO for uterine fibroids   ACHILLES TENDON SURGERY Right 10/07/2012   Procedure: RIGHT REPAIR RUPTURE TENDON PRIMARY OPEN/PERCUTANEOUS ;  Surgeon: Toribio JULIANNA Chancy, MD;  Location: [facility];  Service: Orthopedics;  Laterality: Right;   APPENDECTOMY     BREAST LUMPECTOMY WITH RADIOACTIVE SEED AND SENTINEL LYMPH NODE BIOPSY Right 03/16/2017   Procedure: RIGHT BREAST LUMPECTOMY WITH  RADIOACTIVE SEED AND RIGHT AXILLARY SENTINEL  NODE BIOPSY ERAS PATHWAY;  Surgeon: Ebbie Cough, MD;  Location: [facility];  Service: General;  Laterality: Right;  PECTORAL BLOCK   CATARACT EXTRACTION, BILATERAL     CHOLECYSTECTOMY     COLONOSCOPY N/A 01/25/2024   Procedure: COLONOSCOPY;  Surgeon: Kriss Estefana DEL, DO;  Location: [facility];  Service: Gastroenterology;  Laterality: N/A;   COLONOSCOPY W/ POLYPECTOMY     Dr Rosalie   EAR CYST EXCISION Right 10/07/2012   Procedure: 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 ;  Surgeon: Toribio JULIANNA Chancy, MD;  Location: [facility];  Service: Orthopedics;  Laterality: Right;   ESOPHAGOGASTRODUODENOSCOPY N/A 01/25/2024   Procedure: EGD (ESOPHAGOGASTRODUODENOSCOPY);  Surgeon: Kriss Estefana DEL, DO;  Location: [facility];  Service: Gastroenterology;  Laterality: N/A;   EXPLORATORY LAPAROTOMY     LEFT HEART CATH AND CORONARY ANGIOGRAPHY N/A 08/02/2019   Procedure: LEFT HEART CATH AND CORONARY ANGIOGRAPHY;  Surgeon: Swaziland, Peter M, MD;  Location: [facility] IN[facility]ASIVE CV LAB;  Service: Cardiovascular;  Laterality: N/A;   REVISION TOTAL SHOULDER TO REVERSE TOTAL SHOULDER Left 01/12/2019   Procedure: REVISION TOTAL SHOULDER TO REVERSE TOTAL SHOULDER;  Surgeon: Cristy Bonner DASEN, MD;  Location: [facility];  Service: Orthopedics;  Laterality: Left;   SINUS SURGERY WITH INSTATRAK     TOTAL SHOULDER REPLACEMENT     Right shoulder   TOTAL SHOULDER REPLACEMENT  05/2010   Left   TUBAL LIGATION      Social History:  reports that she quit smoking about 61 years ago. Her smoking use included cigarettes. She started smoking about 67 years ago. She has a 3 pack-year smoking history. She has never been exposed to tobacco smoke. She has never used smokeless tobacco. She reports current alcohol  use of about 3.0 standard drinks of alcohol  per week. She reports that she does not use drugs.  Allergies:  Allergies  Allergen Reactions   Latex Rash    Sulfa  Antibiotics Anaphylaxis and Nausea Only   Aspirin  Other (See Comments)    GI bleeding   Nsaids Other (See Comments)    GI upset    Oxycodone -Acetaminophen  Nausea Only    Medications Prior to Admission  Medication Sig Dispense Refill   acetaminophen  (TYLENOL ) 650 MG CR tablet Take 1 tablet (650 mg total) by mouth every 8 (eight) hours as needed for pain (headaches). (Patient taking differently: Take 1,300 mg by mouth 2 (two) times daily.)     albuterol  (VENTOLIN  HFA) 108 (90 Base) MCG/ACT inhaler TAKE 2 PUFFS BY MOUTH EVERY 6 HOURS AS NEEDED FOR WHEEZE OR SHORTNESS OF BREATH 18 each 0   apixaban  (ELIQUIS ) 5 MG TABS tablet Take 1 tablet (5 mg total) by mouth 2 (two) times daily. 60 tablet 5  benzonatate  (TESSALON ) 200 MG capsule TAKE 1 CAPSULE (200 MG TOTAL) BY MOUTH 3 (THREE) TIMES DAILY AS NEEDED FOR COUGH. 30 capsule 1   Biotin  10000 MCG TABS Take 1,000 mcg by mouth daily.     cetirizine (ZYRTEC) 10 MG tablet Take 10 mg by mouth at bedtime.     dicyclomine  (BENTYL ) 20 MG tablet Take 20 mg by mouth every 6 (six) hours as needed for spasms.     docusate sodium  (COLACE) 250 MG capsule Take 250 mg by mouth at bedtime.     donepezil  (ARICEPT ) 23 MG TABS tablet Take 23 mg by mouth daily.     DULoxetine  (CYMBALTA ) 60 MG capsule Take 2 capsules (120 mg total) by mouth daily. (Patient taking differently: Take 60 mg by mouth daily.) 180 capsule 1   furosemide  (LASIX ) 20 MG tablet Take 1 tablet (20 mg total) by mouth daily. (Patient taking differently: Take 20 mg by mouth in the morning.) 90 tablet 3   hydrOXYzine  (ATARAX ) 25 MG tablet Take 1 tablet (25 mg total) by mouth every 6 (six) hours as needed for anxiety. (Patient taking differently: Take 50 mg by mouth at bedtime.) 90 tablet 1   levothyroxine  (SYNTHROID ) 25 MCG tablet Take 12.5 mcg by mouth daily before breakfast.     memantine  (NAMENDA ) 10 MG tablet Take 1 tablet (10 mg total) by mouth 2 (two) times daily. 180 tablet 3    methocarbamol (ROBAXIN) 500 MG tablet Take 500 mg by mouth 2 (two) times daily.     metoprolol  succinate (TOPROL  XL) 50 MG 24 hr tablet Take 1 tablet (50 mg total) by mouth at bedtime. 90 tablet 3   Multiple Vitamins-Minerals (CENTRUM SILVER) CHEW Chew 1 tablet by mouth daily.     nitroGLYCERIN  (NITROSTAT ) 0.4 MG SL tablet Place 0.4 mg under the tongue as needed.     ondansetron  (ZOFRAN -ODT) 4 MG disintegrating tablet Take 4 mg by mouth every 8 (eight) hours as needed for nausea or vomiting.     oxybutynin  (DITROPAN ) 5 MG tablet Take 5 mg by mouth 2 (two) times daily.     Polyethyl Glycol-Propyl Glycol (SYSTANE OP) Place 1 drop into both eyes 2 (two) times daily as needed (for dryness).     potassium chloride  SA (KLOR-CON  M) 20 MEQ tablet Take 1 tablet (20 mEq total) by mouth daily. (Patient taking differently: Take 20 mEq by mouth at bedtime.) 90 tablet 3   Probiotic Product (PROBIOTIC PO) Take 1 tablet by mouth daily.     Simethicone (GAS-X EXTRA STRENGTH PO) Take 1 tablet by mouth daily as needed.     tamsulosin  (FLOMAX ) 0.4 MG CAPS capsule Take 0.4 mg by mouth at bedtime.     traMADol  (ULTRAM ) 50 MG tablet Take 50 mg by mouth 2 (two) times daily as needed.     valsartan  (DIOVAN ) 80 MG tablet TAKE 1 TABLET BY MOUTH EVERY DAY (Patient taking differently: Take 40 mg by mouth daily.) 90 tablet 2     Physical Exam: Blood pressure (!) 150/55, pulse 75, temperature 97.8 F (36.6 C), temperature source Oral, resp. rate 17, height 5' 3 (1.6 m), weight 88.7 kg, SpO2 98%. General: pleasant, WD/WN female who is laying in bed in NAD HEENT: head is normocephalic, atraumatic.  Sclera are non-icteric.  Lungs:  Respiratory effort nonlabored Abd:  Soft, ND, NT, +BS.  GU: Chaperone, RN, present, [employer] There is a brown, non-bloody stool present in patients depends. External hemorrhoids present without evidence of thrombosis or  bleeding. No pain with palpation.    Results for orders placed or  performed during the hospital encounter of 01/24/24 (from the past 48 hours)  Hemoglobin and hematocrit, blood     Status: Abnormal   Collection Time: 01/26/24  3:50 PM  Result Value Ref Range   Hemoglobin 7.3 (L) 12.0 - 15.0 g/dL   HCT 76.6 (L) 63.9 - 53.9 %    Comment: Performed at [facility], [address], [place], [place] [address]  Basic metabolic panel     Status: Abnormal   Collection Time: 01/27/24  5:27 AM  Result Value Ref Range   Sodium 138 135 - 145 mmol/L   Potassium 3.7 3.5 - 5.1 mmol/L   Chloride 113 (H) 98 - 111 mmol/L   CO2 17 (L) 22 - 32 mmol/L   Glucose, Bld 111 (H) 70 - 99 mg/dL    Comment: Glucose reference range applies only to samples taken after fasting for at least 8 hours.   BUN 14 8 - 23 mg/dL   Creatinine, Ser 8.83 (H) 0.44 - 1.00 mg/dL   Calcium  9.2 8.9 - 10.3 mg/dL   GFR, Estimated 47 (L) >60 mL/min    Comment: (NOTE) Calculated using the CKD-EPI Creatinine Equation (2021)    Anion gap 8 5 - 15    Comment: Performed at [facility], [address], [place], [place] [address]  CBC     Status: Abnormal   Collection Time: 01/27/24  5:27 AM  Result Value Ref Range   WBC 8.5 4.0 - 10.5 K/uL   RBC 2.57 (L) 3.87 - 5.11 MIL/uL   Hemoglobin 7.0 (L) 12.0 - 15.0 g/dL   HCT 77.5 (L) 63.9 - 53.9 %   MCV 87.2 80.0 - 100.0 fL   MCH 27.2 26.0 - 34.0 pg   MCHC 31.3 30.0 - 36.0 g/dL   RDW 85.5 88.4 - 84.4 %   Platelets 356 150 - 400 K/uL   nRBC 0.2 0.0 - 0.2 %    Comment: Performed at [facility], [address], [place], [place] [address]  Hemoglobin and hematocrit, blood     Status: Abnormal   Collection Time: 01/27/24  3:01 PM  Result Value Ref Range   Hemoglobin 7.8 (L) 12.0 - 15.0 g/dL   HCT 74.8 (L) 63.9 - 53.9 %    Comment: Performed at [facility], [address], [place], [place] [address]  CBC     Status: Abnormal   Collection Time: 01/28/24  6:27 AM  Result Value Ref Range   WBC 9.6 4.0 - 10.5 K/uL   RBC 2.55 (L)  3.87 - 5.11 MIL/uL   Hemoglobin 7.0 (L) 12.0 - 15.0 g/dL   HCT 77.0 (L) 63.9 - 53.9 %   MCV 89.8 80.0 - 100.0 fL   MCH 27.5 26.0 - 34.0 pg   MCHC 30.6 30.0 - 36.0 g/dL   RDW 85.3 88.4 - 84.4 %   Platelets 335 150 - 400 K/uL   nRBC 0.0 0.0 - 0.2 %    Comment: Performed at [facility], [address] [place], [place] [address]  Basic metabolic panel     Status: Abnormal   Collection Time: 01/28/24  6:27 AM  Result Value Ref Range   Sodium 140 135 - 145 mmol/L   Potassium 3.5 3.5 - 5.1 mmol/L   Chloride 113 (H) 98 - 111 mmol/L   CO2 19 (L) 22 -  32 mmol/L   Glucose, Bld 112 (H) 70 - 99 mg/dL    Comment: Glucose reference range applies only to samples taken after fasting for at least 8 hours.   BUN 15 8 - 23 mg/dL   Creatinine, Ser 8.97 (H) 0.44 - 1.00 mg/dL   Calcium  9.2 8.9 - 10.3 mg/dL   GFR, Estimated 55 (L) >60 mL/min    Comment: (NOTE) Calculated using the CKD-EPI Creatinine Equation (2021)    Anion gap 8 5 - 15    Comment: Performed at [facility], [address], [place], [place] [address]  Hemoglobin and hematocrit, blood     Status: Abnormal   Collection Time: 01/28/24  2:38 PM  Result Value Ref Range   Hemoglobin 7.0 (L) 12.0 - 15.0 g/dL   HCT 77.1 (L) 63.9 - 53.9 %    Comment: Performed at [facility], [address] [place], [place] [address]   No results found.  Anti-infectives (From admission, onward)    None       Assessment/Plan Lower GI bleed suspected from grade 2 internal hemorrhoids - No indication for emergency surgery for hemorrhoids. If she required a hemorrhoidectomy, she would likely still need anticoagulation held after Stephens to risk of bleeding after surgery - Her bloody BM's have resolved. Hgb stable from this morning. HDS without tachycardia or systolic hypotension.   Recommend conservative management.  - Sitz baths TID and after every bowel movement - Okay to continue iron . Can add vit c to increase absorbtion. This may  increase constipation which is what we need to avoid. Increase bowel regimen and monitor bowel frequency. If becomes constipated and is straining, may need to stop iron .  - Recommend Anusol  suppositories BID - Trend hgb. We will follow peripherally and check her hgb in the AM to ensure that it is stable - Continue to hold Eliquis . Ideally hold this for 10-14 day trial total. Defer if able to hold this long to primary  - Discussed this over the phone with primary team.    FEN - Reg diet VTE - SCDs, on hold for above ID - None  I reviewed nursing notes, Consultant (GI) notes, hospitalist notes, last 24 h vitals and pain scores, last 48 h intake and output, last 24 h labs and trends, and last 24 h imaging results.  Ozell CHRISTELLA Shaper, [facility] Surgery 01/28/2024, 3:20 PM Please see Amion for pager number during day hours 7:00am-4:30pm

## 2024-01-28 NOTE — Progress Notes (Signed)
 Per assigned nurse, Daina Hummer, RN request administered pain and scheduled medications.

## 2024-01-28 NOTE — Progress Notes (Signed)
 Physical Therapy Treatment Patient Details Name: Brenda Stephens MRN: [id number] DOB: 11-26-1940 Today's Date: 01/28/2024   History of Present Illness Brenda Stephens is a 83 yo female who presented after a fall at home. She also reports frequent light headedness and rectal bleeding. PMH: hypertension, hyperlipidemia, hypothyroidism, GERD, A-fib, diastolic dysfunction, anxiety, depression, dementia, breast cancer, PE, DVT, asthmatic bronchitis, anemia    PT Comments  Today's session focused on stair training. Educated pt on how to ascend/descend steps in accordance with her home set up. She completed two flights of stairs with unilateral railing and CGA. Pt took a standing rest break at the top of the stairs. Cued PLB technique to aid in recovery. Recommended having a seat placed on the landing and at top of the stairs to provide her with a rest break at home.  Discussed how her family should be positioned to support her. Will continue to follow acutely and advance appropriately.    If plan is discharge home, recommend the following: A little help with bathing/dressing/bathroom;Assistance with cooking/housework;Assist for transportation;Help with stairs or ramp for entrance;A little help with walking and/or transfers   Can travel by private vehicle        Equipment Recommendations  None recommended by PT    Recommendations for Other Services       Precautions / Restrictions Precautions Precautions: Fall Recall of Precautions/Restrictions: Intact Precaution/Restrictions Comments: watch BP Restrictions Weight Bearing Restrictions Per Provider Order: No     Mobility  Bed Mobility               General bed mobility comments: Not assessed. Pt greeted seated in recliner chair and returned there at end of session.    Transfers Overall transfer level: Needs assistance Equipment used: Rolling walker (2 wheels) Transfers: Sit to/from Stand, Bed to chair/wheelchair/BSC Sit to  Stand: Supervision   Step pivot transfers: Supervision       General transfer comment: Pt demonstrated proper hand placement using RW. Powered up without physical assist. Good eccentric control with sitting.    Ambulation/Gait Ambulation/Gait assistance: Supervision Gait Distance (Feet): 150 Feet Assistive device: Rolling walker (2 wheels) Gait Pattern/deviations: Step-through pattern, Decreased stride length Gait velocity: decreased Gait velocity interpretation: <1.8 ft/sec, indicate of risk for recurrent falls   General Gait Details: Pt ambulated within room and hallway well. She maintained good proximity to RW. Pt demonstrated even weight shift and good foot clearence. No LOB.   Stairs Stairs: Yes Stairs assistance: Contact guard assist Stair Management: One rail Right, Forwards, Step to pattern, Sideways Number of Stairs: 20 General stair comments: Pt ascended forwards with unilat UE support on rail and leading with RLE. She took a standing rest break at the top of the stairs. Pt descended in a diagonal position with BUE support on rail and leading with RLE. No LOB or unsteadiness.   Wheelchair Mobility     Tilt Bed    Modified Rankin (Stroke Patients Only)       Balance Overall balance assessment: Needs assistance Sitting-balance support: Feet supported Sitting balance-Leahy Scale: Good     Standing balance support: Bilateral upper extremity supported, During functional activity Standing balance-Leahy Scale: Poor Standing balance comment: Pt dependent on RW for support                            Communication Communication Communication: No apparent difficulties  Cognition Arousal: Alert Behavior During Therapy: [facility] for tasks assessed/performed  Following commands: Intact      Cueing Cueing Techniques: Verbal cues, Visual cues  Exercises      General Comments General comments (skin integrity, edema,  etc.): VSS. Pt denied dizziness and  lightheadedness.      Pertinent Vitals/Pain Pain Assessment Pain Assessment: No/denies pain    Home Living                          Prior Function            PT Goals (current goals can now be found in the care plan section) Acute Rehab PT Goals Patient Stated Goal: Return Home Progress towards PT goals: Progressing toward goals    Frequency    Min 2X/week      PT Plan      Co-evaluation              AM-PAC PT 6 Clicks Mobility   Outcome Measure  Help needed turning from your back to your side while in a flat bed without using bedrails?: None Help needed moving from lying on your back to sitting on the side of a flat bed without using bedrails?: A Little Help needed moving to and from a bed to a chair (including a wheelchair)?: A Little Help needed standing up from a chair using your arms (e.g., wheelchair or bedside chair)?: A Little Help needed to walk in hospital room?: A Little Help needed climbing 3-5 steps with a railing? : A Little 6 Click Score: 19    End of Session Equipment Utilized During Treatment: Gait belt Activity Tolerance: Patient tolerated treatment well Patient left: in chair;with call bell/phone within reach Nurse Communication: Mobility status PT Visit Diagnosis: Unsteadiness on feet (R26.81);Dizziness and giddiness (R42);Muscle weakness (generalized) (M62.81)     Time: 8461-8445 PT Time Calculation (min) (ACUTE ONLY): 16 min  Charges:    $Gait Training: 8-22 mins PT General Charges $$ ACUTE PT VISIT: 1 Visit                     Randall SAUNDERS, PT, DPT Acute Rehabilitation Services Office: [phone] Secure Chat Preferred  Brenda Stephens 01/28/2024, 4:13 PM

## 2024-01-28 NOTE — Plan of Care (Signed)

## 2024-01-28 NOTE — Plan of Care (Signed)

## 2024-01-29 ENCOUNTER — Other Ambulatory Visit (HOSPITAL_COMMUNITY): Payer: Self-pay

## 2024-01-29 LAB — CBC
HCT: 23.8 % — ABNORMAL LOW (ref 36.0–46.0)
Hemoglobin: 7.2 g/dL — ABNORMAL LOW (ref 12.0–15.0)
MCH: 27.3 pg (ref 26.0–34.0)
MCHC: 30.3 g/dL (ref 30.0–36.0)
MCV: 90.2 fL (ref 80.0–100.0)
Platelets: 330 K/uL (ref 150–400)
RBC: 2.64 MIL/uL — ABNORMAL LOW (ref 3.87–5.11)
RDW: 15.7 % — ABNORMAL HIGH (ref 11.5–15.5)
WBC: 7.9 K/uL (ref 4.0–10.5)
nRBC: 0 % (ref 0.0–0.2)

## 2024-01-29 MED ORDER — METOPROLOL SUCCINATE ER 25 MG PO TB24
25.0000 mg | ORAL_TABLET | Freq: Every day | ORAL | 0 refills | Status: DC
  Filled 2024-01-29: qty 30, 30d supply, fill #0

## 2024-01-29 MED ORDER — ACETAMINOPHEN 500 MG PO TABS
1000.0000 mg | ORAL_TABLET | Freq: Four times a day (QID) | ORAL | 0 refills | Status: AC
  Filled 2024-01-29: qty 30, 4d supply, fill #0

## 2024-01-29 MED ORDER — ENSURE PLUS HIGH PROTEIN PO LIQD
237.0000 mL | Freq: Two times a day (BID) | ORAL | 2 refills | Status: AC
  Filled 2024-01-29: qty 2370, 5d supply, fill #0

## 2024-01-29 MED ORDER — ASCORBIC ACID 1000 MG PO TABS
1000.0000 mg | ORAL_TABLET | Freq: Every day | ORAL | 0 refills | Status: AC
  Filled 2024-01-29: qty 30, 30d supply, fill #0

## 2024-01-29 MED ORDER — PANTOPRAZOLE SODIUM 40 MG PO TBEC
40.0000 mg | DELAYED_RELEASE_TABLET | Freq: Every day | ORAL | 0 refills | Status: DC
Start: 2024-01-30 — End: 2024-02-22
  Filled 2024-01-29: qty 30, 30d supply, fill #0

## 2024-01-29 MED ORDER — POLYETHYLENE GLYCOL 3350 17 GM/SCOOP PO POWD
17.0000 g | Freq: Two times a day (BID) | ORAL | 0 refills | Status: AC
  Filled 2024-01-29: qty 238, 7d supply, fill #0

## 2024-01-29 MED ORDER — PHENYLEPHRINE-COCOA BUTTER 0.25-88.44 % RE SUPP
1.0000 | Freq: Two times a day (BID) | RECTAL | 0 refills | Status: AC
  Filled 2024-01-29: qty 12, 6d supply, fill #0

## 2024-01-29 MED ORDER — SENNOSIDES-DOCUSATE SODIUM 8.6-50 MG PO TABS
2.0000 | ORAL_TABLET | Freq: Every day | ORAL | 0 refills | Status: AC
  Filled 2024-01-29: qty 60, 30d supply, fill #0

## 2024-01-29 MED ORDER — FERROUS SULFATE 325 (65 FE) MG PO TABS
325.0000 mg | ORAL_TABLET | Freq: Every day | ORAL | 0 refills | Status: AC
  Filled 2024-01-29: qty 30, 30d supply, fill #0

## 2024-01-29 MED ORDER — HYDROCORTISONE ACETATE 25 MG RE SUPP
25.0000 mg | Freq: Two times a day (BID) | RECTAL | 0 refills | Status: AC
  Filled 2024-01-29: qty 12, 6d supply, fill #0

## 2024-01-29 NOTE — Discharge Summary (Signed)
 Name: Brenda Stephens MRN: [id number] DOB: [birth year] 83 y.o. PCP: Brenda Stephens  Date of Admission: 01/24/2024  8:42 AM Date of Discharge: 01/29/2024 Attending Physician: Dr. Reyes Stephens  Discharge Diagnosis: 1. Principal Problem:   Symptomatic anemia Active Problems:   Hypothyroidism   Iron  deficiency anemia   History of diverticulosis   Postural dizziness with presyncope   Anxiety and depression   Dementia without behavioral disturbance (HCC)   History of pulmonary embolism   History of DVT of lower extremity   PAF (paroxysmal atrial fibrillation) (HCC)   Hypercoagulable state due to paroxysmal atrial fibrillation (HCC)   Anemia due to GI blood loss   Chronic kidney disease, stage 3a (HCC)   Fall   Dysphagia   Colitis   Hypokalemia   Hemorrhoids    Discharge Medications: Allergies as of 01/29/2024       Reactions   Latex Rash   Sulfa  Antibiotics Anaphylaxis, Nausea Only   Aspirin  Other (See Comments)   GI bleeding   Nsaids Other (See Comments)   GI upset    Oxycodone -acetaminophen  Nausea Only        Medication List     PAUSE taking these medications    Eliquis  5 MG Tabs tablet Wait to take this until your doctor or other care provider tells you to start again. Generic drug: apixaban  Take 1 tablet (5 mg total) by mouth 2 (two) times daily.   valsartan  80 MG tablet Wait to take this until your doctor or other care provider tells you to start again. Commonly known as: DIOVAN  TAKE 1 TABLET BY MOUTH EVERY DAY What changed: how much to take       STOP taking these medications    acetaminophen  650 MG CR tablet Commonly known as: TYLENOL  Replaced by: Acetaminophen  Extra Strength 500 MG Tabs   benzonatate  200 MG capsule Commonly known as: TESSALON    cetirizine 10 MG tablet Commonly known as: ZYRTEC   dicyclomine  20 MG tablet Commonly known as: BENTYL    docusate sodium  250 MG capsule Commonly known as: COLACE   methocarbamol 500  MG tablet Commonly known as: ROBAXIN       TAKE these medications    Acetaminophen  Extra Strength 500 MG Tabs Take 2 tablets (1,000 mg total) by mouth every 6 (six) hours. Replaces: acetaminophen  650 MG CR tablet   albuterol  108 (90 Base) MCG/ACT inhaler Commonly known as: VENTOLIN  HFA TAKE 2 PUFFS BY MOUTH EVERY 6 HOURS AS NEEDED FOR WHEEZE OR SHORTNESS OF BREATH   Biotin  10000 MCG Tabs Take 1,000 mcg by mouth daily.   Centrum Silver Chew Chew 1 tablet by mouth daily.   donepezil  23 MG Tabs tablet Commonly known as: ARICEPT  Take 23 mg by mouth daily.   DULoxetine  60 MG capsule Commonly known as: CYMBALTA  Take 2 capsules (120 mg total) by mouth daily. What changed: how much to take   feeding supplement Liqd Take 237 mLs by mouth 2 (two) times daily between meals.   FeroSul 325 (65 Fe) MG tablet Generic drug: ferrous sulfate  Take 1 tablet (325 mg total) by mouth daily with breakfast. Start taking on: January 30, 2024   furosemide  20 MG tablet Commonly known as: LASIX  Take 1 tablet (20 mg total) by mouth daily. What changed: when to take this   GAS-X EXTRA STRENGTH PO Take 1 tablet by mouth daily as needed.   Hemorrhoidal 0.25-88.44 % suppository Generic drug: shark liver oil-cocoa butter Place 1 suppository rectally 2 (two)  times daily.   hydrocortisone  25 MG suppository Commonly known as: ANUSOL -HC Place 1 suppository (25 mg total) rectally 2 (two) times daily.   hydrOXYzine  25 MG tablet Commonly known as: ATARAX  Take 1 tablet (25 mg total) by mouth every 6 (six) hours as needed for anxiety. What changed:  how much to take when to take this   levothyroxine  25 MCG tablet Commonly known as: SYNTHROID  Take 12.5 mcg by mouth daily before breakfast.   memantine  10 MG tablet Commonly known as: NAMENDA  Take 1 tablet (10 mg total) by mouth 2 (two) times daily.   metoprolol  succinate 25 MG 24 hr tablet Commonly known as: TOPROL -XL Take 1 tablet (25 mg  total) by mouth at bedtime. What changed:  medication strength how much to take   nitroGLYCERIN  0.4 MG SL tablet Commonly known as: NITROSTAT  Place 0.4 mg under the tongue as needed.   ondansetron  4 MG disintegrating tablet Commonly known as: ZOFRAN -ODT Take 4 mg by mouth every 8 (eight) hours as needed for nausea or vomiting.   oxybutynin  5 MG tablet Commonly known as: DITROPAN  Take 5 mg by mouth 2 (two) times daily.   pantoprazole  40 MG tablet Commonly known as: PROTONIX  Take 1 tablet (40 mg total) by mouth daily. Start taking on: January 30, 2024   polyethylene glycol powder 17 GM/SCOOP powder Commonly known as: GLYCOLAX /MIRALAX  Mix 17 g by mouth in water  and take 2 (two) times daily.   potassium chloride  SA 20 MEQ tablet Commonly known as: KLOR-CON  M Take 1 tablet (20 mEq total) by mouth daily. What changed: when to take this   PROBIOTIC PO Take 1 tablet by mouth daily.   Senna-S 8.6-50 MG tablet Generic drug: senna-docusate Take 2 tablets by mouth at bedtime.   SYSTANE OP Place 1 drop into both eyes 2 (two) times daily as needed (for dryness).   tamsulosin  0.4 MG Caps capsule Commonly known as: FLOMAX  Take 0.4 mg by mouth at bedtime.   traMADol  50 MG tablet Commonly known as: ULTRAM  Take 50 mg by mouth 2 (two) times daily as needed.   vitamin C  1000 MG tablet Take 1 tablet (1,000 mg total) by mouth daily. Start taking on: January 30, 2024               Durable Medical Equipment  (From admission, onward)           Start     Ordered   01/27/24 1541  For home use only DME Bedside commode  Once       Question:  Patient needs a bedside commode to treat with the following condition  Answer:  Fall   01/27/24 1541            Disposition and follow-up:   Brenda Stephens was discharged from [facility] in Stable condition.  At the hospital follow up visit please address:  Anticoagulation (Eliquis ) and Cardiovascular  Management: 1.Due to the patient's history of rectal bleeding, Eliquis  was held during hospitalization. Given her atrial fibrillation and history of DVT/PE, careful assessment is needed to determine when to safely resume anticoagulation. Monitor for any signs of recurrent bleeding. Metoprolol  was restarted for rate control, and the patient currently tolerates it without dizziness.  2.Constipation and Rectal Bleeding Management: The patient is using ointments, sitz baths, and suppositories for hemorrhoids with no current indication for surgery. She is advised to take iron  supplements with vitamin C  for anemia, but constipation should be monitored closely. If constipation develops,  consider stool softeners or laxatives as appropriate and ensure close follow-up.  Labs / Imaging Needed at Follow-up:  -CBC to monitor hemoglobin and anemia status -BMP -Repeat orthostatic vitals if dizziness or falls recur  Pending Labs / Tests Needing Follow-up: -Esophageal biopsy pathology results      Follow-up Appointments:  Follow-up Information     [facility] Emergency Department at [facility]. Go to .   Specialty: Emergency Medicine Why: If symptoms worsen Contact information: [address] [place]  [address] [phone]        Brenda Elsie PARAS, PA-C. Schedule an appointment as soon as possible for a visit in 1 week(s).   Specialty: Physician Assistant Contact information: [address] [place] [address] [phone]                  Hospital Course by problem list: Brenda Stephens is a 2 y.o. with a pertinent PMH of PMH of Hypothyroidism, afib, GERD, dementia, diverticulous, rectal bleeding, and DVT presented today after experiencing a fall at home admitted for Anemia and rectal bleeding,  now being discharged on hospital day 4 with the following pertinent hospital course:  Iron  Deficiency Anemia and GI Bleed The patient presented  with a month-long history of bright red rectal bleeding, attributed to hemorrhoids confirmed by colonoscopy showing non-thrombosed external hemorrhoids and Grade II internal hemorrhoids that prolapse with straining but regress spontaneously. Colonoscopy also showed diverticulosis and a 4 mm polyp removed from the descending colon. Initial hemoglobin on admission was 7.9 g/dL, trending down to 7.0 g/dL at nadir, with ferritin at 6 ng/mL and iron  saturation at 2%, consistent with iron  deficiency anemia. The patient was managed with iron  supplementation and Protonix  40 mg daily. Eliquis  anticoagulation was held throughout admission due to active bleeding, last dose administered on 7/26. Transfusion threshold was set at Hgb <7; the patient did not require transfusion as hemoglobin stabilized. The patient was treated symptomatically with Preparation H and Sitz baths. General surgery was consulted for hemorrhoid management. The patient was advised on a high-fiber diet and scheduled for outpatient GI and surgical follow-up. The rectal bleeding improved during hospitalization (last inpatient Hgb was stable at 7.2, no downward trend observed).  Falls, Postural Dizziness, and Chronic Pain The patient has a history of frequent falls and reported postural dizziness with presyncope. Orthostatic vital signs showed significant drops on standing (e.g., BP from 161/51 supine to 86/62 standing with dizziness). Beta-blocker (metoprolol ) was initially held due to hypotension and orthostatic symptoms but later cautiously restarted at 25 mg with close monitoring. Pain control included Tylenol  1000 mg every 8 hours and Dilaudid  1 mg PO every 4 hours PRN. The patient engaged in PT and OT, showing good progress towards acute rehabilitation goals with improved mobility using a rolling walker with supervision and no observed loss of balance or dizziness during ambulation. Home health OT/PT and durable medical equipment including a bedside  commode and walker were arranged. The patient expressed a preference for discharge home with home health services rather than to a skilled nursing facility.  Esophageal Dysphagia The patient reported longstanding, non-progressive dysphagia worse with solids than liquids, without associated weight loss or odynophagia. An EGD performed on 7/28 showed no endoscopic abnormalities in the esophagus, stomach, or duodenum. Biopsies were taken to evaluate for eosinophilic esophagitis, with pathology results pending. The patient was maintained on a regular diet during hospitalization with ongoing monitoring.  Hypokalemia Potassium was mildly low at 3.0 mmol/L on recent  labs with magnesium  at 2.1 mg/dL. The patient was treated with oral potassium chloride  40 mEq twice daily for two doses, with plans to monitor electrolytes and adjust as needed.  Elevated Serum Creatinine and Chronic Kidney Disease Stage 3a The patient's baseline creatinine is approximately 1.2 mg/dL. On admission, elevated creatinine to 1.16-1.4 mg/dL reflected mild AKI likely secondary to volume depletion from blood loss and poor oral intake. After intravenous fluids, the creatinine improved to 1.10 mg/dL, close to baseline. BUN was stable around 14 mg/dL. No further renal intervention was required.  Urinary Retention The patient, on Flomax  and oxybutynin  at home, had these medications held during admission. A Foley catheter was placed initially due to retention on arrival but was removed with good spontaneous urination subsequently. A Purewick device was used for urinary management, and the patient remained continent.  Atrial Fibrillation, History of DVT and PE The patient has a history of atrial fibrillation treated with Eliquis  and metoprolol  for rate control. On admission, the patient was tachycardic but in normal rhythm. Eliquis  was held throughout hospitalization due to active GI bleeding; last dose was on 7/26. Metoprolol  was initially  stopped due to orthostatic hypotension but later restarted at a low dose with good tolerance. Compression stockings were applied for VTE prophylaxis. Risk-benefit discussions regarding anticoagulation were held with the patient and family. Telemetry monitoring showed stable rhythm without arrhythmia.  Dementia The patient's dementia remained stable throughout the hospital stay. Home medications including donepezil , memantine , and seroquel  at bedtime were continued without changes.    Thank you for allowing us  to be part of your care. You were hospitalized for an episode of  fall. We also treat you for anemia and rectal bleeding.  See the changes in your medications and management of your chronic conditions below:  *For your  -We have STARTED you on these following medications:  - Metoprolol  25 mg  - Vitamin C  1000 mg tablet  -.Ferrous sulfate  325 mg tablet  -Hydrocortisone  25 mg suppository              -We have STOPPED the following medications: Eliquis  (please speak with your cardiologist and PCP about resuming the medication)   FOLLOW UP APPOINTMENTS: We arranged for you to follow up with your family doctor: Please visit Brenda Elsie PARAS, PA-C in 7 days You should also be contacted to arrange home health physical therapy.  Please make sure to  drink good amount of water  high-fiber food Use your bedside commode visit your PCP in 1 week for speaking about your medication  follow-up with your cardiologist about your Eliquis  and metoprolol .  Please call your PCP or our clinic if you have any questions or concerns, we may be able to help and keep you from a long and expensive emergency room wait. Our clinic and after hours phone number is [phone]. The best time to call is Monday through Friday 9 am to 4 pm but there is always someone available 24/7 if you have an emergency. If you need medication refills please notify your pharmacy one week in advance and they will send us  a  request.   We are glad you are feeling better,  Armando Rossetti Internal Medicine Inpatient Teaching Service at [facility]    Stable chronic medical conditions: Paroxysmal atrial fibrillation History of pulmonary embolism History of DVT of lower extremity Anxiety and depression Dementia without behavioral disturbance  Subjective The patient reports that overall she is doing okay, slept well last night, and had a bowel  movement this morning which was not bloody. She denies any constipation. Physical therapy will be coming to see her at home and she will continue using the bedside commode. Eliquis  has been held for now, with plans to follow up with cardiology as an outpatient. Metoprolol  was restarted yesterday, and she denies any dizziness since then. For her hemorrhoids, she is using ointments, supps, and sitz baths and currently does not require surgery. Regarding her anemia, she is advised to take iron  supplements with vitamin C , and if constipation develops, she should notify her primary care provider.  Discharge Exam:   BP (!) 151/67 (BP Location: Left Arm)   Pulse 85   Temp 98.7 F (37.1 C) (Oral)   Resp 16   Ht 5' 3 (1.6 m)   Wt 88.7 kg   SpO2 98%   BMI 34.64 kg/m  Lung: clear without crackle Heart: Tachycardic, Irregular Abdomen: No tenderness or rebounding Lower Ext: No swelling Radial Pulses: full Pertinent Labs, Studies, and Procedures:     Latest Ref Rng & Units 01/29/2024    6:55 AM 01/28/2024    2:38 PM 01/28/2024    6:27 AM  CBC  WBC 4.0 - 10.5 K/uL 7.9   9.6   Hemoglobin 12.0 - 15.0 g/dL 7.2  7.0  7.0   Hematocrit 36.0 - 46.0 % 23.8  22.8  22.9   Platelets 150 - 400 K/uL 330   335        Latest Ref Rng & Units 01/28/2024    6:27 AM 01/27/2024    5:27 AM 01/26/2024    6:16 AM  CMP  Glucose 70 - 99 mg/dL 887  888  885   BUN 8 - 23 mg/dL 15  14  14    Creatinine 0.44 - 1.00 mg/dL 8.97  8.83  8.89   Sodium 135 - 145 mmol/L 140  138  138   Potassium 3.5 -  5.1 mmol/L 3.5  3.7  3.0   Chloride 98 - 111 mmol/L 113  113  110   CO2 22 - 32 mmol/L 19  17  19    Calcium  8.9 - 10.3 mg/dL 9.2  9.2  9.2     DG Ankle Complete Right Result Date: 01/24/2024 CLINICAL DATA:  Fall and trauma to the right ankle. EXAM: RIGHT ANKLE - COMPLETE 3+ VIEW COMPARISON:  None Available. FINDINGS: There is no acute fracture or dislocation. The bones are osteopenic. The ankle mortise is intact. Fixation screws in the distal first metatarsal. The soft tissues are unremarkable. IMPRESSION: 1. No acute fracture or dislocation. 2. Osteopenia. Electronically Signed   By: Vanetta Chou M.D.   On: 01/24/2024 13:20   CT CHEST ABDOMEN PELVIS W CONTRAST Result Date: 01/24/2024 CLINICAL DATA:  Patient fell out of bed this morning and hit the back of her head. Dizziness. Right shoulder pain. EXAM: CT CHEST, ABDOMEN, AND PELVIS WITH CONTRAST TECHNIQUE: Multidetector CT imaging of the chest, abdomen and pelvis was performed following the standard protocol during bolus administration of intravenous contrast. RADIATION DOSE REDUCTION: This exam was performed according to the departmental dose-optimization program which includes automated exposure control, adjustment of the mA and/or kV according to patient size and/or use of iterative reconstruction technique. CONTRAST:  75mL OMNIPAQUE  IOHEXOL  350 MG/ML SOLN COMPARISON:  11/21/2023 FINDINGS: CT CHEST FINDINGS Cardiovascular: Heart size mildly increased. No substantial pericardial effusion. Mild atherosclerotic calcification is noted in the wall of the thoracic aorta. No large central pulmonary embolus. Mediastinum/Nodes: No mediastinal lymphadenopathy. There is  no hilar lymphadenopathy. The esophagus has normal imaging features. There is no axillary lymphadenopathy. Portions of the axilla in supraclavicular regions obscured by beam hardening artifact from bilateral shoulder replacement. Lungs/Pleura: 4 mm right lower lobe nodule seen on 92/6. 4 mm  left lower lobe nodule visible on 77/6. 2 mm left upper lobe nodule identified on 56/6. Calcified granuloma right lower lobe on 85/6. Tiny lower lobe pulmonary nodules are stable since prior. No focal airspace consolidation. There is no evidence of pleural effusion. Musculoskeletal: Bilateral shoulder replacement. Chronic fracture nonunion of the sternum. No worrisome lytic or sclerotic osseous abnormality. CT ABDOMEN PELVIS FINDINGS Hepatobiliary:. A tiny hypodensity in the liver parenchyma is too small to characterize but is statistically most likely benign. No followup imaging is recommended. Gallbladder is surgically absent. No intrahepatic or extrahepatic biliary dilation. Pancreas: Diffuse parenchymal atrophy without main duct dilatation. Spleen: No splenomegaly. No suspicious focal mass lesion. Adrenals/Urinary Tract: No adrenal nodule or mass. Tiny well-defined homogeneous low-density lesions in both kidneys are too small to characterize but are statistically most likely benign and probably cysts. No followup imaging is recommended. No evidence for hydroureter. The urinary bladder appears normal for the degree of distention. Stomach/Bowel: Stomach is unremarkable. No gastric wall thickening. No evidence of outlet obstruction. Duodenum is normally positioned as is the ligament of Treitz. No small bowel wall thickening. No small bowel dilatation. The terminal ileum is normal. Nonvisualization of the appendix is consistent with the reported history of appendectomy. No gross colonic mass. No colonic wall thickening. Vascular/Lymphatic: There is moderate atherosclerotic calcification of the abdominal aorta without aneurysm. There is no gastrohepatic or hepatoduodenal ligament lymphadenopathy. No retroperitoneal or mesenteric lymphadenopathy. No pelvic sidewall lymphadenopathy. Reproductive: Hysterectomy.  There is no adnexal mass. Other: No intraperitoneal free fluid. Musculoskeletal: No worrisome lytic or  sclerotic osseous abnormality. Stable superior endplate compression deformity at L1 since 11/21/2023 no evidence for thoracolumbar spine fracture. No evidence for an acute fracture of the bony pelvis. IMPRESSION: 1. No acute findings in the chest, abdomen, or pelvis. No free fluid in the peritoneal cavity. Specifically, no findings to explain the patient's history of dizziness and right shoulder pain. 2. Stable tiny bilateral pulmonary nodules. No follow-up needed if patient is low-risk (and has no known or suspected primary neoplasm). Non-contrast chest CT can be considered in 12 months if patient is high-risk. This recommendation follows the consensus statement: Guidelines for Management of Incidental Pulmonary Nodules Detected on CT Images: From the Fleischner Society 2017; Radiology 2017; 284:228-243. 3. Stable superior endplate compression deformity at L1 since 11/21/2023. 4. Chronic fracture nonunion of the sternum. 5.  Aortic Atherosclerosis (ICD10-I70.0). Electronically Signed   By: Camellia Candle M.D.   On: 01/24/2024 10:03   DG Chest Portable 1 View Result Date: 01/24/2024 CLINICAL DATA:  Fall. EXAM: PORTABLE CHEST 1 VIEW COMPARISON:  Chest radiograph dated 11/21/2023. FINDINGS: No focal consolidation, pleural effusion or pneumothorax. The cardiac silhouette is within normal limits. No acute osseous pathology. Bilateral shoulder arthroplasties. IMPRESSION: No active disease. Electronically Signed   By: Vanetta Chou M.D.   On: 01/24/2024 09:54   DG Pelvis Portable Result Date: 01/24/2024 CLINICAL DATA:  Fall. EXAM: PORTABLE PELVIS 1-2 VIEWS COMPARISON:  Abdomen and pelvis CT 11/21/2023. FINDINGS: There is no evidence of pelvic fracture or diastasis. No pelvic bone lesions are seen. IMPRESSION: Negative. Electronically Signed   By: Camellia Candle M.D.   On: 01/24/2024 09:54   CT Cervical Spine Wo Contrast Result Date: 01/24/2024 EXAM: CT CERVICAL SPINE  WITHOUT CONTRAST 01/24/2024 09:32:55 AM  TECHNIQUE: CT of the cervical spine was performed without the administration of intravenous contrast. Multiplanar reformatted images are provided for review. Automated exposure control, iterative reconstruction, and/or weight based adjustment of the mA/kV was utilized to reduce the radiation dose to as low as reasonably achievable. COMPARISON: CT of the cervical spine 09/18/2023. MRI of the cervical spine 10/25/2023. CLINICAL HISTORY: Neck trauma (Age >= 65y). CT Head Wo Contrast; Head trauma, minor (Age >= 65y); CT Cervical Spine Wo Contrast; Neck trauma (Age >= 65y); CT CHEST ABDOMEN PELVIS W CONTRAST; Polytrauma, blunt. FINDINGS: CERVICAL SPINE: BONES AND ALIGNMENT: Straightening of the normal cervical lordosis is similar to the prior study. Degenerative grade 1 anterolisthesis at C4-5, C7-T1 and T2-T3 is stable. DEGENERATIVE CHANGES: Chronic endplate degenerative changes are again noted at C5-6, C6-7, T1-2 and T2-3. Severe foraminal stenosis is again noted bilaterally at C3-4. Moderate left foraminal stenosis is stable at C5-6. SOFT TISSUES: No prevertebral soft tissue swelling. VASCULATURE: Atherosclerosis is present at the carotid bifurcation without definite stenosis. IMPRESSION: 1. No acute abnormality of the cervical spine related to the reported neck trauma. 2. Straightening of the normal cervical lordosis, similar to the prior study. 3. Degenerative grade 1 anterolisthesis at C4-5, C7-T1, and T2-T3, stable. 4. Chronic endplate degenerative changes at C5-6, C6-7, T1-2, and T2-3. 5. Severe foraminal stenosis bilaterally at C3-4 and moderate left foraminal stenosis at C5-6, stable. Electronically signed by: Lonni Necessary MD 01/24/2024 09:41 AM EDT RP Workstation: [id number]   CT Head Wo Contrast Result Date: 01/24/2024 EXAM: CT HEAD WITHOUT CONTRAST 01/24/2024 09:32:55 AM TECHNIQUE: CT of the head was performed without the administration of intravenous contrast. Automated exposure control, iterative  reconstruction, and/or weight based adjustment of the mA/kV was utilized to reduce the radiation dose to as low as reasonably achievable. COMPARISON: CT head without contrast 09/18/2023. CLINICAL HISTORY: Head trauma, minor (Age >= 65y). Polytrauma, blunt. FINDINGS: BRAIN AND VENTRICLES: No acute hemorrhage. Gray-white differentiation is preserved. No hydrocephalus. No extra-axial collection. No mass effect or midline shift. Moderate periventricular and subcortical white matter hypoattenuation is noted bilaterally without significant interval change. ORBITS: Bilateral lens replacements are noted. The globes and orbits are otherwise within normal limits. SINUSES: No acute abnormality. SOFT TISSUES AND SKULL: No acute soft tissue abnormality. No skull fracture. VASCULATURE: Atherosclerotic calcifications are present in the cavernous carotid arteries bilaterally. No hyperdense vessel is present. IMPRESSION: 1. No acute intracranial abnormality related to the minor head trauma. 2. Moderate periventricular and subcortical white matter hypoattenuation bilaterally without significant interval change. Electronically signed by: Lonni Necessary MD 01/24/2024 09:37 AM EDT RP Workstation: [id number]     Discharge Instructions: Discharge Instructions     (HEART FAILURE PATIENTS) Call MD:  Anytime you have any of the following symptoms: 1) 3 pound weight gain in 24 hours or 5 pounds in 1 week 2) shortness of breath, with or without a dry hacking cough 3) swelling in the hands, feet or stomach 4) if you have to sleep on extra pillows at night in order to breathe.   Complete by: As directed    Call MD for:  difficulty breathing, headache or visual disturbances   Complete by: As directed    Call MD for:  extreme fatigue   Complete by: As directed    Call MD for:  persistant dizziness or light-headedness   Complete by: As directed    Diet - low sodium heart healthy   Complete by: As directed    Increase activity  slowly   Complete by: As directed        Signed: Bernadine Manos, MD 01/29/2024, 5:30 PM

## 2024-01-29 NOTE — Progress Notes (Signed)
 Patient IV has been removed per discharge order. When assessing the IV site on removal, I noticed her site was red and swollen with an increase in pain when applying pressure to the site. Patient did not receive anything IV from me today, I reached out to out floor pharmacist Adrienne for possible infiltration, who advised to apply warm compress and to follow up with PCP if swelling has not gone down in two days or if an increase in pain or swelling from the site is present. Patient and daughter Myla made aware, AVS instructions have been reviewed, and verbalized understanding. MD Eben made aware, no new orders.

## 2024-01-29 NOTE — Progress Notes (Signed)
 Occupational Therapy Treatment Patient Details Name: ANARIA KRONER MRN: [id number] DOB: [birth year] Today's Date: 01/29/2024   History of present illness CRYSTELLE FERRUFINO is a 83 yo female who presented after a fall at home. She also reports frequent light headedness and rectal bleeding. PMH: hypertension, hyperlipidemia, hypothyroidism, GERD, A-fib, diastolic dysfunction, anxiety, depression, dementia, breast cancer, PE, DVT, asthmatic bronchitis, anemia   OT comments  Pt is making great progress towards their acute OT goals. Pt was able to ambulate with RW and complete dressing , toileting, peri hygiene and grooming with up to supervision A, no safety concerns or LOB. Increased time and sitting rest breaks throughout for energy conservation and SOB management. OT to continue to follow acutely to facilitate progress towards established goals. Pt will continue to benefit from [facility]       If plan is discharge home, recommend the following:  A little help with walking and/or transfers;A lot of help with bathing/dressing/bathroom;Direct supervision/assist for medications management;Direct supervision/assist for financial management;Assist for transportation;Help with stairs or ramp for entrance;Supervision due to cognitive status   Equipment Recommendations  BSC/3in1       Precautions / Restrictions Precautions Precautions: Fall Recall of Precautions/Restrictions: Intact Precaution/Restrictions Comments: watch BP Restrictions Weight Bearing Restrictions Per Provider Order: No       Mobility Bed Mobility               General bed mobility comments: OOB on arrival    Transfers Overall transfer level: Needs assistance Equipment used: Rolling walker (2 wheels) Transfers: Sit to/from Stand Sit to Stand: Supervision                 Balance Overall balance assessment: Needs assistance Sitting-balance support: Feet supported Sitting balance-Leahy Scale: Good     Standing  balance support: Single extremity supported, During functional activity Standing balance-Leahy Scale: Fair Standing balance comment: statically                           ADL either performed or assessed with clinical judgement   ADL Overall ADL's : Needs assistance/impaired     Grooming: Supervision/safety;Standing   Lower Body Dressing: Set up;Sit to/from stand   Toilet Transfer: Retail banker;Ambulation;Rolling walker (2 wheels)   Toileting- Clothing Manipulation and Hygiene: Modified independent;Sitting/lateral lean       Functional mobility during ADLs: Supervision/safety;Rolling walker (2 wheels) General ADL Comments: incr time and rest breaks for energy conservation and SOB    Extremity/Trunk Assessment Upper Extremity Assessment Upper Extremity Assessment: Generalized weakness   Lower Extremity Assessment Lower Extremity Assessment: Defer to PT evaluation        Vision   Vision Assessment?: No apparent visual deficits   Perception Perception Perception: Within Functional Limits   Praxis Praxis Praxis: WFL   Communication Communication Communication: No apparent difficulties   Cognition Arousal: Alert Behavior During Therapy: WFL for tasks assessed/performed Cognition: History of cognitive impairments             OT - Cognition Comments: Overall WFL for basic command following and ADLs                 Following commands: Intact        Cueing   Cueing Techniques: Verbal cues, Visual cues        General Comments VSS on RA,    Pertinent Vitals/ Pain       Pain Assessment Pain Assessment: No/denies pain   Frequency  Min  2X/week        Progress Toward Goals  OT Goals(current goals can now be found in the care plan section)  Progress towards OT goals: Progressing toward goals  Acute Rehab OT Goals Patient Stated Goal: home OT Goal Formulation: With patient Time For Goal Achievement:  02/08/24 Potential to Achieve Goals: Good ADL Goals Pt Will Perform Lower Body Dressing: with modified independence;sit to/from stand Pt Will Transfer to Toilet: with modified independence;ambulating Additional ADL Goal #1: Pt will indep recall at least 3 fall prevention strategies to reduce fall risk at discharge Additional ADL Goal #2: pt will tolerate at least 5 minutes of OOB funtional activity to demonstrate improved endurace for ADLs  Plan      Co-evaluation                 AM-PAC OT 6 Clicks Daily Activity     Outcome Measure   Help from another person eating meals?: None Help from another person taking care of personal grooming?: A Little Help from another person toileting, which includes using toliet, bedpan, or urinal?: A Little Help from another person bathing (including washing, rinsing, drying)?: A Little Help from another person to put on and taking off regular upper body clothing?: A Little Help from another person to put on and taking off regular lower body clothing?: A Little 6 Click Score: 19    End of Session Equipment Utilized During Treatment: Rolling walker (2 wheels)  OT Visit Diagnosis: Unsteadiness on feet (R26.81);Other abnormalities of gait and mobility (R26.89);Repeated falls (R29.6);Muscle weakness (generalized) (M62.81);History of falling (Z91.81);Pain;Dizziness and giddiness (R42)   Activity Tolerance Patient tolerated treatment well   Patient Left in bed;with bed alarm set;with call bell/phone within reach   Nurse Communication Mobility status        Time:  -     Charges: OT General Charges $OT Visit: 1 Visit  Lucie Kendall, OTR/L Acute Rehabilitation Services Office [phone] Secure Chat Communication Preferred   Lucie JONETTA Kendall 01/29/2024, 1:20 PM

## 2024-01-29 NOTE — Plan of Care (Signed)

## 2024-01-29 NOTE — Progress Notes (Signed)
 Hgb this am 7.2 from 7. No tachycardia or hypotension on vitals. Discussed with primary team. They plan to discharge patient. We will sign off. Please call back with any questions, concerns or changes.  Brenda Stephens , [facility] 01/29/2024, 9:31 AM Please see Amion for pager number during day hours 7:00am-4:30pm

## 2024-01-29 NOTE — Progress Notes (Signed)
 Patel, DO came to look at patients left arm IV site. No new orders, plan is to apply warm compress, monitor for increase swelling or pain at the site. Follow up with PCP. Patient verbalized understanding.

## 2024-01-30 ENCOUNTER — Other Ambulatory Visit (HOSPITAL_COMMUNITY): Payer: Self-pay

## 2024-02-01 NOTE — Telephone Encounter (Signed)
 3rd attempt to contact pt. Will send a letter to have pt call our office.

## 2024-02-01 NOTE — Telephone Encounter (Signed)
 Left voice message on pt's home number and daughter's number per DPR regarding an appointment. Pt has been scheduled for 8/21 at 10:20 with [facility]. Will try to notify pt again later.

## 2024-02-02 NOTE — Telephone Encounter (Signed)
 Spoke with pt's daughter per [facility] regarding an appointment. [name] aware of appointment on 8/21. Daughter verbalized understanding. All questions if any were answered.

## 2024-02-15 ENCOUNTER — Other Ambulatory Visit (HOSPITAL_COMMUNITY): Payer: Self-pay

## 2024-02-15 NOTE — Progress Notes (Deleted)
 Subjective:    Patient ID: Brenda Stephens, female    DOB: February 26, 1941, 83 y.o.   MRN: [id number]  HPI F former smoker followed for allergic rhinitis, Bronchitis, complicated by HBP, GERD, Anemia, R breast cancer Walk Test 10/12/19- desat to 89% with max HR 95. Mostly sats around 94-96. 03/28/21- 306 meters, average pace, stopping x3 for SOB, lowest O2 sat 95%, max HR 103. Cardiac cath 08/02/19- normal PFT10/26/22- she was again unable to follow directions- when to inhale and when to blow out- tech unable to complete test.  Hgb 12.7 9/13 [facility] ------------------------------------------------------------------------------------   08/25/22- 83 yoF former smoker (3 pk yrs) followed for allergic Rhinitis, acute Bronchitis,  Lung Nodule, complicated by HBP, Gr1DD,  GERD, Anemia, R breast cancer, Alzheimers, DVT/PE/Eliquis ,  -albuterol  hfa, Flonase ,  Covid vax-5 Phizer Flu vax-had Hosp 1/16-1/18- after RDVT/ bilat PE.  Follow-up manage by her PCP. New paresthesias left leg.  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 Albuterol  inhaler helps, used occasionally as needed.  No significant wheeze most days. CTaPE 06/30/22- 1. Positive for acute PE with CT evidence of right heart strain (RV/LV Ratio = 1.31 ) consistent with at least submassive (intermediate risk) PE. The presence of right heart strain has been associated with an increased risk of morbidity and mortality. Please refer to the PE Focused order set in EPIC. 2. Aortic and coronary artery atherosclerosis. 3. Trace pleural effusions. 4. Hepatic steatosis. 5. Results phoned to Dr. Trine in the ED at 3:03 a.m., 06/30/2022, with verbal acknowledgement of the key findings. Aortic Atherosclerosis (ICD10-I70.0).  02/16/24- 83 yoF former smoker (3 pk yrs) followed for allergic Rhinitis, acute Bronchitis,  Lung Nodules, complicated by HBP, Gr1DD,  GERD, Anemia, R breast cancer, Alzheimers, DVT/PE/Eliquis ,  -albuterol  hfa,  Flonase ,  Hx multiple lung nodules followed because hx of breast cancer. Hosp late July w/ GI blood loss anemia on Eliquis . Eliquis  was held pending outpt instruction to resume.   ROS-see HPI   + = positive Constitutional:   No-   weight loss, night sweats, fevers, chills, fatigue, lassitude. HEENT:   + headaches, difficulty swallowing, tooth/dental problems, sore throat,       No-  sneezing, itching, ear ache, nasal congestion, +post nasal drip,  CV:  No-   chest pain, orthopnea, PND, swelling in lower extremities, anasarca,  dizziness, palpitations Resp: + shortness of breath with exertion or at rest.               +productive cough,  No non-productive cough,  No- coughing up of blood.   +   change in color of mucus.  No- wheezing.   Skin: No-   rash or lesions. GI:  No-   heartburn, indigestion, abdominal pain, nausea, vomiting, GU:  MS:  No-   joint pain or swelling.  Neuro-     nothing unusual Psych:  No- change in mood or affect. No depression or anxiety.  No memory loss.    Objective:  OBJ- Physical Exam General- Alert, Oriented, Affect-appropriate, Distress- none acute, + overweight Skin- rash-none, lesions- none, excoriation- none Lymphadenopathy- none Head- atraumatic            Eyes- Gross vision intact, PERRLA, conjunctivae and secretions clear            Ears- Hearing ok,             Nose-, no-Septal dev,  polyps, erosion, perforation  Throat- Mallampati III , mucosa clear , drainage- none, tonsils- atrophic Neck- flexible , trachea midline, no stridor , thyroid  nl, carotid no bruit Chest - symmetrical excursion , unlabored           Heart/CV- RRR , no murmur , no gallop  , no rub, nl s1 s2                           - JVD- none , edema-none, stasis changes- none, varices- none           Lung- + fine rales, unlabored, wheeze- none, cough none, dullness-none, rub- none           Chest wall-  Abd- Br/ Gen/ Rectal- Not done, not indicated Extrem- cyanosis-  none, clubbing, none, atrophy- none, strength- nl.  Neuro- +slight head bob Assessment & Plan:

## 2024-02-16 ENCOUNTER — Ambulatory Visit: Payer: MEDICARE | Admitting: Internal Medicine

## 2024-02-17 NOTE — Progress Notes (Deleted)
 Cardiology Office Note:   Date:  02/17/2024  ID:  Brenda Stephens, Brenda Stephens 06-27-1941, MRN [id number] PCP: [name]  Brenda Stephens Cardiologist:  Lynwood Schilling, MD {  History of Present Illness:   Brenda Stephens is a 83 y.o. female who I saw in September last year for evaluation of chest pain.  I saw her for palpitations in 2017 and leg swelling in 2019.  She had chest pain and had normal coronaries in 2021.  She has had submassive bilateral pulmonary emboli with right lower extremity DVT.  She was placed on heparin  and then eventually Eliquis .  She was thought to have breast cancer and sedentary lifestyle as an etiology.  Echocardiogram demonstrated well-preserved ejection fraction.    On 02/11/2023 and was diagnosed with new atrial fibrillation with RVR.  She was only partially compliant with her Eliquis .  She was subsequently admitted to the hospital for A-fib with RVR.  Initially she was placed on Cardizem  drip.    She converted on her own back into NSR.    She lives with her daughter.  ***   *** She gets around slowly with a walker or a wheelchair.  She just felt very weak when she went to the hospital.  She had low blood sugar.  She felt lightheaded.  She does have chest discomfort but has had normal coronaries in 2021.  She does occasionally take nitroglycerin .  I do note that in the hospital with her atrial fibrillation with rapid rate her troponins were normal.  She does not have any acute shortness of breath, PND or orthopnea.  She has not had any new chest pressure, neck or arm discomfort.  She would not really know she was in fibrillation.  Of note she had been off of her beta-blocker because she has not been able to get the correct dose through her pharmacy.  She does take her blood thinner but forgets to take the morning dose sometimes.  ROS: ***  Studies Reviewed:    EKG:       ***  Risk Assessment/Calculations:   {Does this patient have ATRIAL  FIBRILLATION?[phone]} No BP recorded.  {Refresh Note OR Click here to enter BP  :1}***        Physical Exam:   VS:  There were no vitals taken for this visit.   Wt Readings from Last 3 Encounters:  01/25/24 195 lb 8.8 oz (88.7 kg)  11/26/23 183 lb (83 kg)  11/23/23 183 lb 3.2 oz (83.1 kg)     GEN: Well nourished, well developed in no acute distress NECK: No JVD; No carotid bruits CARDIAC: ***RR, *** murmurs, rubs, gallops RESPIRATORY:  Clear to auscultation without rales, wheezing or rhonchi  ABDOMEN: Soft, non-tender, non-distended EXTREMITIES:  No edema; No deformity   ASSESSMENT AND PLAN:   HTN:    Her blood pressure is ***  mildly elevated.  She has not yet restarted her beta-blocker which is going to start.    PULMONARY EMBOLI:   ***  She now has another indication to take her blood thinner.  I told both her and her daughter to set their phones to alarm to the morning dose.  They take other twice daily drugs.  They think they can comply with twice daily dosing rather than switching to Xarelto.    AORTIC ROOT ENLARGEMENT: This was 40 mm..  ***     I will follow this conservatively.   CHEST PAIN: She has had no  objective evidence of ischemia.  She had normal coronary arteries a few years ago on cath.  No further cardiac workup.   PAF:   ***   She is not having PAF.  She is already on anticoagulation.  She is going to restart the beta-blocker.  At this point I am not planning meds for rhythm control unless she has recurrent symptomatic fibrillation.  I think she would be a poor candidate for ablation.  PULMONARY NODULE:  ***         Follow up ***  Signed, Lynwood Schilling, MD

## 2024-02-18 ENCOUNTER — Ambulatory Visit: Payer: MEDICARE | Admitting: Cardiology

## 2024-02-18 DIAGNOSIS — R072 Precordial pain: Secondary | ICD-10-CM

## 2024-02-18 DIAGNOSIS — I480 Paroxysmal atrial fibrillation: Secondary | ICD-10-CM

## 2024-02-18 DIAGNOSIS — I2699 Other pulmonary embolism without acute cor pulmonale: Secondary | ICD-10-CM

## 2024-02-18 DIAGNOSIS — I10 Essential (primary) hypertension: Secondary | ICD-10-CM

## 2024-02-20 ENCOUNTER — Other Ambulatory Visit: Payer: Self-pay | Admitting: Physician Assistant

## 2024-02-21 ENCOUNTER — Other Ambulatory Visit: Payer: Self-pay | Admitting: Physician Assistant

## 2024-02-22 ENCOUNTER — Other Ambulatory Visit (HOSPITAL_COMMUNITY): Payer: Self-pay

## 2024-02-22 ENCOUNTER — Other Ambulatory Visit: Payer: Self-pay

## 2024-02-26 ENCOUNTER — Other Ambulatory Visit: Payer: Self-pay

## 2024-02-26 ENCOUNTER — Other Ambulatory Visit (HOSPITAL_COMMUNITY): Payer: Self-pay

## 2024-03-01 ENCOUNTER — Encounter (HOSPITAL_BASED_OUTPATIENT_CLINIC_OR_DEPARTMENT_OTHER): Payer: Self-pay

## 2024-03-03 ENCOUNTER — Ambulatory Visit: Payer: MEDICARE | Admitting: Physician Assistant

## 2024-03-10 ENCOUNTER — Other Ambulatory Visit (HOSPITAL_COMMUNITY): Payer: Self-pay

## 2024-03-16 ENCOUNTER — Encounter: Payer: Self-pay | Admitting: *Deleted

## 2024-03-18 ENCOUNTER — Ambulatory Visit: Admitting: Emergency Medicine

## 2024-03-18 NOTE — Progress Notes (Deleted)
 Cardiology Office Note:    Date:  03/18/2024  ID:  TRANIYA PRICHETT, DOB 02-03-1941, MRN [id number] PCP: [name]  [facility] Providers Cardiologist:  Lynwood Schilling, MD { Click to update primary MD,subspecialty MD or APP then REFRESH:1}    {Click to Open Review  :1}   Patient Profile:       Chief Complaint: *** History of Present Illness:  Brenda Stephens is a 83 y.o. female with visit-pertinent history of paroxysmal atrial fibrillation, chronic HFpEF, bilateral PE/DVT on Eliquis , chest pain with normal coronary arteries on catheterization 2021, asthma, hyperlipidemia, breast cancer, GERD, hypertension  Myoview  in July 2019 showed EF 70%, fixed small defect of mild severity in the apical anterior wall likely due to breast attenuation artifact, overall low risk study.  Cardiac catheterization performed on 08/02/2019 showed normal coronary arteries, normal LVEDP, EF 55 to 65%.  Heart monitor in August 2022 showed normal sinus rhythm, infrequent ventricular ectopy and SVT, symptom of chest discomfort and fluttering sensation did not correlate with any significant arrhythmia.  Previous echocardiogram in January 2024 showed EF 60 to 65%, no regional wall motion abnormality, grade 1 DD, moderately reduced RV systolic function.  In 06/2022 venous Doppler consistent with acute DVT in the right popliteal vein.  CTA of the chest showed acute PE with evidence of right heart strain consistent with at least submassive PE, trace pleural effusion, aortic and coronary artery atherosclerosis.   She was seen in clinic on 02/11/2023 and was diagnosed with new atrial fibrillation with RVR.  She was only partially compliant with her Eliquis .  She was admitted on 02/12/2023 after being evaluated outpatient and found to have new onset atrial fibrillation/flutter with RVR.  Initially she presented with shortness of breath and chest pressure thought related to her RVR.  She had minimally elevated  troponins that were flat.  During admission she spontaneously converted to normal sinus rhythm on Cardizem  drip.  She also had some issues with volume and was given IV Lasix  40 mg twice daily however that was titrated down at the last days of her admission.  Patient also had poorly controlled hypertension so amlodipine  was increased to 7.5 mg.   She was seen in the hospital 03/04/2023 for chest pains.  She was eval by cardiology.  Her symptoms were thought to be in relation to GERD.  It was recommended for her to transition to Protonix  40 mg daily and stop Nexium.  She was discharged home in stable condition.  She was last seen in clinic on 11/26/2023.  She has not been consistently taking her beta-blocker or blood thinner.  Patient thinks that she can comply with twice daily dosing rather than switching to Xarelto.  She continues to have chronic chest pains, no further cardiac workup was recommended.  Discussed the use of AI scribe software for clinical note transcription with the patient, who gave verbal consent to proceed.  History of Present Illness     Review of systems:  Please see the history of present illness. All other systems are reviewed and otherwise negative. ***      Studies Reviewed:        ***  Risk Assessment/Calculations:   {Does this patient have ATRIAL FIBRILLATION?:[phone]} No BP recorded.  {Refresh Note OR Click here to enter BP  :1}***        Physical Exam:   VS:  There were no vitals taken for this visit.   Wt Readings from Last 3 Encounters:  01/25/24 195 lb 8.8 oz (88.7 kg)  11/26/23 183 lb (83 kg)  11/23/23 183 lb 3.2 oz (83.1 kg)    GEN: Well nourished, well developed in no acute distress NECK: No JVD; No carotid bruits CARDIAC: ***RRR, no murmurs, rubs, gallops RESPIRATORY:  Clear to auscultation without rales, wheezing or rhonchi  ABDOMEN: Soft, non-tender, non-distended EXTREMITIES:  No edema; No acute deformity ***      Assessment and Plan:     Assessment and Plan Assessment & Plan      {Are you ordering a CV Procedure (e.g. stress test, cath, DCCV, TEE, etc)?   Press F2        :[id number]}  Dispo:  No follow-ups on file.  Signed, Lum LITTIE Louis, NP

## 2024-03-21 ENCOUNTER — Other Ambulatory Visit (HOSPITAL_COMMUNITY): Payer: Self-pay

## 2024-03-25 ENCOUNTER — Ambulatory Visit: Admitting: Nurse Practitioner

## 2024-03-29 ENCOUNTER — Other Ambulatory Visit (HOSPITAL_COMMUNITY): Payer: Self-pay

## 2024-03-29 ENCOUNTER — Other Ambulatory Visit: Payer: Self-pay | Admitting: Cardiology

## 2024-03-29 ENCOUNTER — Encounter (HOSPITAL_COMMUNITY): Payer: Self-pay

## 2024-03-30 ENCOUNTER — Other Ambulatory Visit (HOSPITAL_COMMUNITY): Payer: Self-pay

## 2024-04-05 ENCOUNTER — Other Ambulatory Visit (HOSPITAL_COMMUNITY): Payer: Self-pay

## 2024-04-11 ENCOUNTER — Other Ambulatory Visit (HOSPITAL_COMMUNITY): Payer: Self-pay

## 2024-04-11 ENCOUNTER — Other Ambulatory Visit: Payer: Self-pay

## 2024-05-02 ENCOUNTER — Ambulatory Visit: Attending: Nurse Practitioner | Admitting: Nurse Practitioner

## 2024-05-02 ENCOUNTER — Encounter: Payer: Self-pay | Admitting: Nurse Practitioner

## 2024-05-02 VITALS — BP 130/74 | HR 87 | Ht 63.0 in | Wt 174.0 lb

## 2024-05-02 DIAGNOSIS — Z86711 Personal history of pulmonary embolism: Secondary | ICD-10-CM | POA: Diagnosis not present

## 2024-05-02 DIAGNOSIS — Z86718 Personal history of other venous thrombosis and embolism: Secondary | ICD-10-CM | POA: Diagnosis not present

## 2024-05-02 DIAGNOSIS — I10 Essential (primary) hypertension: Secondary | ICD-10-CM | POA: Diagnosis not present

## 2024-05-02 DIAGNOSIS — D508 Other iron deficiency anemias: Secondary | ICD-10-CM

## 2024-05-02 DIAGNOSIS — I951 Orthostatic hypotension: Secondary | ICD-10-CM

## 2024-05-02 DIAGNOSIS — I480 Paroxysmal atrial fibrillation: Secondary | ICD-10-CM

## 2024-05-02 DIAGNOSIS — E039 Hypothyroidism, unspecified: Secondary | ICD-10-CM

## 2024-05-02 MED ORDER — VALSARTAN 40 MG PO TABS: 40.0000 mg | ORAL_TABLET | Freq: Every day | ORAL | 3 refills | Status: AC

## 2024-05-02 MED ORDER — FUROSEMIDE 20 MG PO TABS: 20.0000 mg | ORAL_TABLET | Freq: Every day | ORAL | 3 refills | Status: AC

## 2024-05-02 MED ORDER — METOPROLOL SUCCINATE ER 25 MG PO TB24
25.0000 mg | ORAL_TABLET | Freq: Every day | ORAL | 3 refills | Status: AC
Start: 2024-05-02 — End: ?

## 2024-05-02 NOTE — Progress Notes (Signed)
 Office Visit    Patient Name: Brenda Stephens Date of Encounter: 05/02/2024  Primary Care Provider:  Loris Elsie PARAS, PA-C Primary Cardiologist:  Lynwood Schilling, MD  Chief Complaint    83 year old female with a history of  chronic intermittent chest pain, palpitations, paroxysmal atrial fibrillation, PE, DVT, hypertension, orthostatic hypotension, hyperlipidemia, CKD stage IIIa, hypothyroidism, iron  deficiency anemia, GI bleed, dementia, and GERD who presents for follow-up related to atrial fibrillation.  Past Medical History    Past Medical History:  Diagnosis Date   Acute deep vein thrombosis (DVT) of proximal vein of right lower extremity (HCC) 07/02/2022   Acute pulmonary embolism (HCC) 07/15/2022   AKI (acute kidney injury) 02/16/2023   Allergy    latex   Anemia    Anxiety    Arthritis    Atrial fibrillation with RVR (HCC) 02/12/2023   Bilateral pulmonary embolism (HCC) 06/30/2022   Cancer (HCC) 01/2017   right breast cancer   Depression    Dyslipidemia    Eustachian tube dysfunction    GERD (gastroesophageal reflux disease)    Grade I diastolic dysfunction 07/15/2022   Hx of cardiovascular stress test    Myoview  10/13:  apical thinning, EF 72%, no ischemia   Hx of colonic polyps    Dr Rosalie   Hypertension    Hypothyroidism    Other abnormal glucose    Pulmonary embolism (HCC) 06/30/2022   Past Surgical History:  Procedure Laterality Date   ABDOMINAL HYSTERECTOMY     BSO for uterine fibroids   ACHILLES TENDON SURGERY Right 10/07/2012   Procedure: RIGHT REPAIR RUPTURE TENDON PRIMARY OPEN/PERCUTANEOUS ;  Surgeon: Toribio JULIANNA Chancy, MD;  Location: [facility];  Service: Orthopedics;  Laterality: Right;   APPENDECTOMY     BREAST LUMPECTOMY WITH RADIOACTIVE SEED AND SENTINEL LYMPH NODE BIOPSY Right 03/16/2017   Procedure: RIGHT BREAST LUMPECTOMY WITH RADIOACTIVE SEED AND RIGHT AXILLARY SENTINEL  NODE BIOPSY ERAS PATHWAY;  Surgeon: Ebbie Cough,  MD;  Location: [facility];  Service: General;  Laterality: Right;  PECTORAL BLOCK   CATARACT EXTRACTION, BILATERAL     CHOLECYSTECTOMY     COLONOSCOPY N/A 01/25/2024   Procedure: COLONOSCOPY;  Surgeon: Kriss Estefana VEAR, DO;  Location: [facility] END[facility]COPY;  Service: Gastroenterology;  Laterality: N/A;   COLONOSCOPY W/ POLYPECTOMY     Dr Rosalie   EAR CYST EXCISION Right 10/07/2012   Procedure: 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 ;  Surgeon: Toribio JULIANNA Chancy, MD;  Location: [facility];  Service: Orthopedics;  Laterality: Right;   ESOPHAGOGASTRODUODENOSCOPY N/A 01/25/2024   Procedure: EGD (ESOPHAGOGASTRODUODENOSCOPY);  Surgeon: Kriss Estefana VEAR, DO;  Location: [facility] ENDOSCOPY;  Service: Gastroenterology;  Laterality: N/A;   EXPLORATORY LAPAROTOMY     LEFT HEART CATH AND CORONARY ANGIOGRAPHY N/A 08/02/2019   Procedure: LEFT HEART CATH AND CORONARY ANGIOGRAPHY;  Surgeon: Jordan, Peter M, MD;  Location: [facility] INVASIVE CV LAB;  Service: Cardiovascular;  Laterality: N/A;   REVISION TOTAL SHOULDER TO REVERSE TOTAL SHOULDER Left 01/12/2019   Procedure: REVISION TOTAL SHOULDER TO REVERSE TOTAL SHOULDER;  Surgeon: Cristy Bonner DASEN, MD;  Location: [facility];  Service: Orthopedics;  Laterality: Left;   SINUS SURGERY WITH INSTATRAK     TOTAL SHOULDER REPLACEMENT     Right shoulder   TOTAL SHOULDER REPLACEMENT  05/2010   Left   TUBAL LIGATION      Allergies  Allergies  Allergen Reactions   Latex Rash   Sulfa  Antibiotics Anaphylaxis and  Nausea Only   Aspirin  Other (See Comments)    GI bleeding   Nsaids Other (See Comments)    GI upset    Oxycodone -Acetaminophen  Nausea Only     Labs/Other Studies Reviewed    The following studies were reviewed today:  Cardiac Studies & Procedures   ______________________________________________________________________________________________ CARDIAC CATHETERIZATION  CARDIAC CATHETERIZATION 08/02/2019  Conclusion  The left ventricular  systolic function is normal.  LV end diastolic pressure is normal.  The left ventricular ejection fraction is 55-65% by visual estimate.  1. Normal coronary anatomy 2. Normal LV function 3. Normal LVEDP  Plan: medical management.  Findings Coronary Findings Diagnostic  Dominance: Right  Left Main Vessel was injected. Vessel is normal in caliber. Vessel is angiographically normal.  Left Anterior Descending Vessel was injected. Vessel is normal in caliber. Vessel is angiographically normal.  Left Circumflex Vessel was injected. Vessel is normal in caliber. Vessel is angiographically normal.  Right Coronary Artery Vessel was injected. Vessel is normal in caliber. Vessel is angiographically normal.  Intervention  No interventions have been documented.   STRESS TESTS  MYOCARDIAL PERFUSION IMAGING 12/29/2017  Interpretation Summary  The left ventricular ejection fraction is hyperdynamic (>65%).  Nuclear stress EF: 70%.  No T wave inversion was noted during stress.  There was no ST segment deviation noted during stress.  Defect 1: There is a small defect of mild severity.  This is a low risk study.  Small size, mild intensity fixed apical anterior perfusion defect, likely breast attenuation artifact. No reversible ischemia. LVEF 70% with normal wall motion. This is a low risk study.   ECHOCARDIOGRAM  ECHOCARDIOGRAM COMPLETE 11/22/2023  Narrative ECHOCARDIOGRAM REPORT    Patient Name:   Brenda Stephens Date of Exam: 11/22/2023 Medical Rec #:  [id number]       Height:       63.0 in Accession #:    [id number]      Weight:       187.6 lb Date of Birth:  16-Sep-1940       BSA:          1.882 m Patient Age:    83 years        BP:           112/55 mmHg Patient Gender: F               HR:           74 bpm. Exam Location:  Inpatient  Procedure: 2D Echo, Cardiac Doppler and Color Doppler (Both Spectral and Color Flow Doppler were utilized during  procedure).  Indications:    Chest Pain R07.9  History:        Patient has prior history of Echocardiogram examinations, most recent 02/12/2023. Arrythmias:Atrial Fibrillation; Risk Factors:Hypertension.  Sonographer:    Jayson Gaskins Referring Phys: [id number] MARSA NOVAK MELVIN  IMPRESSIONS   1. Left ventricular ejection fraction, by estimation, is 60 to 65%. The left ventricle has normal function. The left ventricle has no regional wall motion abnormalities. There is mild left ventricular hypertrophy. Left ventricular diastolic parameters are consistent with Grade II diastolic dysfunction (pseudonormalization). 2. Right ventricular systolic function is normal. The right ventricular size is normal. 3. The mitral valve is normal in structure. Mild mitral valve regurgitation. No evidence of mitral stenosis. 4. The aortic valve is tricuspid. Aortic valve regurgitation is not visualized. No aortic stenosis is present. 5. The inferior vena cava is normal in size with greater than 50% respiratory variability, suggesting right  atrial pressure of 3 mmHg.  FINDINGS Left Ventricle: Left ventricular ejection fraction, by estimation, is 60 to 65%. The left ventricle has normal function. The left ventricle has no regional wall motion abnormalities. The left ventricular internal cavity size was normal in size. There is mild left ventricular hypertrophy. Left ventricular diastolic parameters are consistent with Grade II diastolic dysfunction (pseudonormalization).  Right Ventricle: The right ventricular size is normal. Right ventricular systolic function is normal.  Left Atrium: Left atrial size was normal in size.  Right Atrium: Right atrial size was normal in size.  Pericardium: There is no evidence of pericardial effusion.  Mitral Valve: The mitral valve is normal in structure. Mild mitral annular calcification. Mild mitral valve regurgitation. No evidence of mitral valve stenosis.  Tricuspid  Valve: The tricuspid valve is normal in structure. Tricuspid valve regurgitation is trivial. No evidence of tricuspid stenosis.  Aortic Valve: The aortic valve is tricuspid. Aortic valve regurgitation is not visualized. No aortic stenosis is present. Aortic valve mean gradient measures 3.0 mmHg. Aortic valve peak gradient measures 5.8 mmHg. Aortic valve area, by VTI measures 2.43 cm.  Pulmonic Valve: The pulmonic valve was not well visualized. Pulmonic valve regurgitation is not visualized. No evidence of pulmonic stenosis.  Aorta: The aortic root is normal in size and structure.  Venous: The inferior vena cava is normal in size with greater than 50% respiratory variability, suggesting right atrial pressure of 3 mmHg.  IAS/Shunts: The interatrial septum was not well visualized.   LEFT VENTRICLE PLAX 2D LVIDd:         4.90 cm   Diastology LVIDs:         3.90 cm   LV e' medial:    6.09 cm/s LV PW:         0.80 cm   LV E/e' medial:  17.9 LV IVS:        0.90 cm   LV e' lateral:   5.66 cm/s LVOT diam:     1.90 cm   LV E/e' lateral: 19.3 LV SV:         62 LV SV Index:   33 LVOT Area:     2.84 cm   RIGHT VENTRICLE RV S prime:     16.30 cm/s TAPSE (M-mode): 2.0 cm  LEFT ATRIUM           Index        RIGHT ATRIUM           Index LA Vol (A2C): 37.0 ml 19.66 ml/m  RA Area:     12.30 cm LA Vol (A4C): 45.4 ml 24.13 ml/m  RA Volume:   28.70 ml  15.25 ml/m AORTIC VALVE AV Area (Vmax):    2.08 cm AV Area (Vmean):   2.27 cm AV Area (VTI):     2.43 cm AV Vmax:           120.00 cm/s AV Vmean:          83.400 cm/s AV VTI:            0.254 m AV Peak Grad:      5.8 mmHg AV Mean Grad:      3.0 mmHg LVOT Vmax:         88.20 cm/s LVOT Vmean:        66.800 cm/s LVOT VTI:          0.218 m LVOT/AV VTI ratio: 0.86  AORTA Ao Root diam: 2.70 cm  MITRAL VALVE  TRICUSPID VALVE MV Area (PHT): 3.20 cm     TR Peak grad:   14.0 mmHg MV Decel Time: 237 msec     TR Vmax:         187.00 cm/s MV E velocity: 109.00 cm/s MV A velocity: 88.00 cm/s   SHUNTS MV E/A ratio:  1.24         Systemic VTI:  0.22 m Systemic Diam: 1.90 cm  Redell Shallow MD Electronically signed by Redell Shallow MD Signature Date/Time: 11/22/2023/11:04:32 AM    Final    MONITORS  CARDIAC EVENT MONITOR 01/31/2021  Narrative Normal sinus rhythm No sustained arrhythmias Infrequent ventricular ectopy and supraventricular ectopy Symptoms of chest discomfort, fluttering, fatigue did not correlate with any significant arrhythmias that occurred during normal sinus rhythm or during episodes of ectopy.       ______________________________________________________________________________________________     Recent Labs: 11/21/2023: TSH 5.770 01/24/2024: ALT 17 01/26/2024: Magnesium  2.1 01/28/2024: BUN 15; Creatinine, Ser 1.02; Potassium 3.5; Sodium 140 01/29/2024: Hemoglobin 7.2; Platelets 330  Recent Lipid Panel    Component Value Date/Time   CHOL 208 (H) 07/19/2012 1520   TRIG 152.0 (H) 07/19/2012 1520   HDL 70.30 07/19/2012 1520   CHOLHDL 3 07/19/2012 1520   VLDL 30.4 07/19/2012 1520   LDLCALC 91 06/13/2011 1134   LDLDIRECT 108.4 07/19/2012 1520    History of Present Illness    83 year old female with the above past medical history including chronic intermittent chest pain, palpitations, paroxysmal atrial fibrillation, PE, DVT, hypertension, orthostatic hypotension, hyperlipidemia, CKD stage IIIa, hypothyroidism, iron  deficiency anemia, GI bleed, dementia, and GERD.  She has a history of chronic intermittent chest pain.  Cardiac catheterization in 2021 showed normal coronary arteries.  She has a history of PE/DVT, paroxysmal atrial fibrillation on chronic anticoagulation with Eliquis .  Most recent echocardiogram in 10/2023 showed EF 60 to 65%, normal LV function, no RWMA, mild LVH, G2 DD, normal RV systolic function, mild mitral valve regurgitation.  She was last in the office on 11/26/2023  and was stable from a cardiac standpoint.  She was hospitalized in August 2025 in the setting of severe symptomatic anemia, GI bleed. Additionally, she reported frequent falls, postural dizziness with presyncope in the setting of orthostatic hypotension. Colonoscopy showed nonthrombosed external hemorrhoids, grade 2 internal hemorrhoids. Eliquis  was held. She did not require transfusion, hemoglobin stabilized. Eliquis  was subsequently resumed.  She presents today for follow-up accompanied by her daughter. Since her last visit and hospitalization she has been stable overall from a cardiac standpoint. She notes chronic dyspnea on exertion, overall unchanged from prior visits.  She shares that she is mostly sedentary.  She has stable nonpitting bilateral lower extremity edema, as well as intermittent palpitations. She denies chest pain. Overall, her symptoms have been stable.  Home Medications    Current Outpatient Medications  Medication Sig Dispense Refill   acetaminophen  (TYLENOL ) 500 MG tablet Take 2 tablets (1,000 mg total) by mouth every 6 (six) hours. 30 tablet 0   albuterol  (VENTOLIN  HFA) 108 (90 Base) MCG/ACT inhaler TAKE 2 PUFFS BY MOUTH EVERY 6 HOURS AS NEEDED FOR WHEEZE OR SHORTNESS OF BREATH 18 each 0   [Paused] apixaban  (ELIQUIS ) 5 MG TABS tablet Take 1 tablet (5 mg total) by mouth 2 (two) times daily. 60 tablet 5   ascorbic acid  (VITAMIN C ) 1000 MG tablet Take 1 tablet (1,000 mg total) by mouth daily. 30 tablet 0   Biotin  10000 MCG TABS Take 1,000 mcg by mouth daily.  donepezil  (ARICEPT ) 23 MG TABS tablet Take 23 mg by mouth daily.     DULoxetine  (CYMBALTA ) 60 MG capsule Take 2 capsules (120 mg total) by mouth daily. (Patient taking differently: Take 60 mg by mouth daily.) 180 capsule 1   feeding supplement (ENSURE PLUS HIGH PROTEIN) LIQD Take 237 mLs by mouth 2 (two) times daily between meals. 2370 mL 2   ferrous sulfate  325 (65 FE) MG tablet Take 1 tablet (325 mg total) by mouth  daily with breakfast. 30 tablet 0   hydrocortisone  (ANUSOL -HC) 25 MG suppository Place 1 suppository (25 mg total) rectally 2 (two) times daily. 12 suppository 0   hydrOXYzine  (ATARAX ) 25 MG tablet Take 1 tablet (25 mg total) by mouth every 6 (six) hours as needed for anxiety. (Patient taking differently: Take 25 mg by mouth. Taking 2 tabs at night) 90 tablet 1   memantine  (NAMENDA ) 10 MG tablet Take 1 tablet (10 mg total) by mouth 2 (two) times daily. 180 tablet 3   Multiple Vitamins-Minerals (CENTRUM SILVER) CHEW Chew 1 tablet by mouth daily.     nitroGLYCERIN  (NITROSTAT ) 0.4 MG SL tablet Place 0.4 mg under the tongue as needed.     ondansetron  (ZOFRAN -ODT) 4 MG disintegrating tablet Take 4 mg by mouth every 8 (eight) hours as needed for nausea or vomiting.     Polyethyl Glycol-Propyl Glycol (SYSTANE OP) Place 1 drop into both eyes 2 (two) times daily as needed (for dryness).     polyethylene glycol powder (GLYCOLAX /MIRALAX ) 17 GM/SCOOP powder Mix 17 g by mouth in water  and take 2 (two) times daily. 238 g 0   potassium chloride  SA (KLOR-CON  M) 20 MEQ tablet Take 1 tablet (20 mEq total) by mouth daily. 90 tablet 3   Probiotic Product (PROBIOTIC PO) Take 1 tablet by mouth daily.     senna-docusate (SENOKOT-S) 8.6-50 MG tablet Take 2 tablets by mouth at bedtime. (Patient taking differently: Take 1 tablet by mouth at bedtime.) 60 tablet 0   shark liver oil-cocoa butter (PREPARATION H) 0.25-88.44 % suppository Place 1 suppository rectally 2 (two) times daily. 12 suppository 0   Simethicone (GAS-X EXTRA STRENGTH PO) Take 1 tablet by mouth daily as needed.     tamsulosin  (FLOMAX ) 0.4 MG CAPS capsule Take 0.4 mg by mouth at bedtime.     traMADol  (ULTRAM ) 50 MG tablet Take 50 mg by mouth 2 (two) times daily as needed.     valsartan  (DIOVAN ) 40 MG tablet Take 1 tablet (40 mg total) by mouth daily. 90 tablet 3   furosemide  (LASIX ) 20 MG tablet Take 1 tablet (20 mg total) by mouth daily. Take 1 tablet daily.  May take an additional 20 mg for swelling or weight of 3 lb overnight or 5 lb in 1 week. 180 tablet 3   levothyroxine  (SYNTHROID ) 25 MCG tablet Take 12.5 mcg by mouth daily before breakfast. (Patient not taking: Reported on 05/02/2024)     metoprolol  succinate (TOPROL -XL) 25 MG 24 hr tablet Take 1 tablet (25 mg total) by mouth at bedtime. 90 tablet 3   oxybutynin  (DITROPAN ) 5 MG tablet Take 5 mg by mouth 2 (two) times daily. (Patient not taking: Reported on 05/02/2024)     pantoprazole  (PROTONIX ) 40 MG tablet TAKE 1 TABLET BY MOUTH EVERY DAY 90 tablet 0   No current facility-administered medications for this visit.     Review of Systems    She denies chest pain, pnd, orthopnea, n, v, dizziness, syncope, weight gain, or early satiety.  All other systems reviewed and are otherwise negative except as noted above.   Physical Exam    VS:  BP 130/74 (Cuff Size: Normal)   Pulse 87   Ht 5' 3 (1.6 m)   Wt 174 lb (78.9 kg)   SpO2 97%   BMI 30.82 kg/m   GEN: Well nourished, well developed, in no acute distress. HEENT: normal. Neck: Supple, no JVD, carotid bruits, or masses. Cardiac: RRR, no murmurs, rubs, or gallops. No clubbing, cyanosis, nonpitting bilateral lower extremity edema.  Radials/DP/PT 2+ and equal bilaterally.  Respiratory:  Respirations regular and unlabored, clear to auscultation bilaterally. GI: Soft, nontender, nondistended, BS + x 4. MS: no deformity or atrophy. Skin: warm and dry, no rash. Neuro:  Strength and sensation are intact. Psych: Normal affect.  Accessory Clinical Findings    ECG personally reviewed by me today - EKG Interpretation Date/Time:  Monday May 02 2024 15:06:26 EST Ventricular Rate:  87 PR Interval:  160 QRS Duration:  72 QT Interval:  400 QTC Calculation: 481 R Axis:   31  Text Interpretation: Sinus rhythm with occasional Premature ventricular complexes When compared with ECG of 24-Jan-2024 08:53, PREVIOUS ECG IS PRESENT Confirmed by Daneen Perkins ([id number]) on 05/02/2024 3:31:30 PM  - no acute changes.   Lab Results  Component Value Date   WBC 7.9 01/29/2024   HGB 7.2 (L) 01/29/2024   HCT 23.8 (L) 01/29/2024   MCV 90.2 01/29/2024   PLT 330 01/29/2024   Lab Results  Component Value Date   CREATININE 1.02 (H) 01/28/2024   BUN 15 01/28/2024   NA 140 01/28/2024   K 3.5 01/28/2024   CL 113 (H) 01/28/2024   CO2 19 (L) 01/28/2024   Lab Results  Component Value Date   ALT 17 01/24/2024   AST 25 01/24/2024   ALKPHOS 53 01/24/2024   BILITOT 0.6 01/24/2024   Lab Results  Component Value Date   CHOL 208 (H) 07/19/2012   HDL 70.30 07/19/2012   LDLCALC 91 06/13/2011   LDLDIRECT 108.4 07/19/2012   TRIG 152.0 (H) 07/19/2012   CHOLHDL 3 07/19/2012    Lab Results  Component Value Date   HGBA1C 6.1 06/13/2011    Assessment & Plan   1. Paroxysmal atrial fibrillation: Most recent echocardiogram in 10/2023 showed EF 60 to 65%, normal LV function, no RWMA, mild LVH, G2 DD, normal RV systolic function, mild mitral valve regurgitation. Maintaining sinus rhythm on exam.  She notes intermittent palpitations, nonpitting bilateral lower extremity edema, stable chronic dyspnea on exertion, unchanged from prior visits. Well compensated on exam.  She reports mild bleeding in the setting of hemorrhoids.  She has only been taking metoprolol  12.5 mg daily.  Her medication list indicates she should be taking metoprolol  25 mg daily.  Will increase metoprolol  to 25 mg daily.  She may take an additional Lasix  20 mg daily as needed for swelling, weight gain.  Reviewed sodium recommendations.  Will check CBC, BMET, TSH today.  Continue Eliquis .  2. History of PE/DVT: Continue Eliquis .   3. Hypertension/orthostatic hypotension: BP well controlled. She has only been taking a half a tablet of her valsartan  (40 mg daily).  Will change medication list to reflect current dosing.  Will increase metoprolol  as above.  Otherwise, continue current  antihypertensive regimen.   4. CKD stage IIIa: Creatinine was stable at 1.02 in 12/2023.  Repeat BMET pending as above.  5. Hypothyroidism: TSH was 8.066 in 01/2024.  Will repeat TSH.  Recommend  follow-up with PCP.  6. Iron  deficiency anemia: Recent symptomatic anemia, GI bleed.  She does note some mild bleeding in the setting of hemorrhoids.  She does have ongoing dyspnea on exertion, overall unchanged from prior visits.  Will update CBC as above.  7. Disposition:  Follow-up in 3-4 months.       Damien JAYSON Braver, NP 05/02/2024, 6:24 PM

## 2024-05-02 NOTE — Patient Instructions (Addendum)
 Medication Instructions:  Lasix  20 mg daily. May take an additional 20 mg for weight gain, swelling. Metoprolol  25 mg daily before bed Valsartan  40 mg daily   *If you need a refill on your cardiac medications before your next appointment, please call your pharmacy*  Lab Work: CBC, BMET, TSH today  Testing/Procedures: NONE ordered at this time of appointment   Follow-Up: At [facility], you and your health needs are our priority.  As part of our continuing mission to provide you with exceptional heart care, our providers are all part of one team.  This team includes your primary Cardiologist (physician) and Advanced Practice Providers or APPs (Physician Assistants and Nurse Practitioners) who all work together to provide you with the care you need, when you need it.  Your next appointment:   3-4 month(s)  Provider:   Lynwood Schilling, MD    We recommend signing up for the patient portal called MyChart.  Sign up information is provided on this After Visit Summary.  MyChart is used to connect with patients for Virtual Visits (Telemedicine).  Patients are able to view lab/test results, encounter notes, upcoming appointments, etc.  Non-urgent messages can be sent to your provider as well.   To learn more about what you can do with MyChart, go to [email or web address].

## 2024-05-03 LAB — BASIC METABOLIC PANEL WITH GFR
BUN/Creatinine Ratio: 15 (ref 12–28)
BUN: 16 mg/dL (ref 8–27)
CO2: 22 mmol/L (ref 20–29)
Calcium: 10.2 mg/dL (ref 8.7–10.3)
Chloride: 98 mmol/L (ref 96–106)
Creatinine, Ser: 1.05 mg/dL — ABNORMAL HIGH (ref 0.57–1.00)
Glucose: 117 mg/dL — ABNORMAL HIGH (ref 70–99)
Potassium: 4.5 mmol/L (ref 3.5–5.2)
Sodium: 137 mmol/L (ref 134–144)
eGFR: 53 mL/min/1.73 — ABNORMAL LOW (ref >59)

## 2024-05-03 LAB — TSH: TSH: 9.69 u[IU]/mL — ABNORMAL HIGH (ref 0.450–4.500)

## 2024-05-03 LAB — CBC
Hematocrit: 37 % (ref 34.0–46.6)
Hemoglobin: 11.9 g/dL (ref 11.1–15.9)
MCH: 29.3 pg (ref 26.6–33.0)
MCHC: 32.2 g/dL (ref 31.5–35.7)
MCV: 91 fL (ref 79–97)
Platelets: 338 x10E3/uL (ref 150–450)
RBC: 4.06 x10E6/uL (ref 3.77–5.28)
RDW: 13 % (ref 11.7–15.4)
WBC: 8.9 x10E3/uL (ref 3.4–10.8)

## 2024-05-05 ENCOUNTER — Other Ambulatory Visit: Payer: Self-pay

## 2024-05-05 ENCOUNTER — Ambulatory Visit: Payer: Self-pay | Admitting: Nurse Practitioner

## 2024-05-05 NOTE — Telephone Encounter (Signed)
 Refill request for albuterol  from [facility] [place], [place].  Per chart note Dr. Neysa:  Return in about 6 months (around 02/23/2023). Script sent refilling albuterol  hfa inhaler      Have sent in multiple refills and informed patient needs OV.  No OV has been made.  Will deny refill until patient makes OV.

## 2024-05-22 ENCOUNTER — Other Ambulatory Visit: Payer: Self-pay | Admitting: Physician Assistant

## 2024-07-01 ENCOUNTER — Other Ambulatory Visit (HOSPITAL_COMMUNITY): Payer: Self-pay

## 2024-07-31 NOTE — Progress Notes (Unsigned)
 " Cardiology Office Note:   Date:  07/31/2024  ID:  Brenda Stephens, DOB 08/13/1940, MRN [id number] PCP: [name]  [facility] Providers Cardiologist:  Lynwood Schilling, MD {  History of Present Illness:   Brenda Stephens is a 84 y.o. female with a history of chronic intermittent chest pain.  Cardiac catheterization in 2021 showed normal coronary arteries.  She has a history of PE/DVT, paroxysmal atrial fibrillation on chronic anticoagulation with Eliquis .  Most recent echocardiogram in 10/2023 showed EF 60 to 65%, normal LV function, no RWMA, mild LVH, G2 DD, normal RV systolic function, mild mitral valve regurgitation.  She was last in the office on 11/26/2023 and was stable from a cardiac standpoint.  She was hospitalized in August 2025 in the setting of severe symptomatic anemia, GI bleed. Additionally, she reported frequent falls, postural dizziness with presyncope in the setting of orthostatic hypotension. Colonoscopy showed nonthrombosed external hemorrhoids, grade 2 internal hemorrhoids. Eliquis  was held. She did not require transfusion, hemoglobin stabilized. Eliquis  was subsequently resumed.   She presents today for follow-up ***   ***  accompanied by her daughter. Since her last visit and hospitalization she has been stable overall from a cardiac standpoint. She notes chronic dyspnea on exertion, overall unchanged from prior visits.  She shares that she is mostly sedentary.  She has stable nonpitting bilateral lower extremity edema, as well as intermittent palpitations. She denies chest pain. Overall, her symptoms have been stable.  ROS: ***  Studies Reviewed:    EKG:       ***  Risk Assessment/Calculations:   {Does this patient have ATRIAL FIBRILLATION?:[id number]} No BP recorded.  {Refresh Note OR Click here to enter BP  :1}***        Physical Exam:   VS:  There were no vitals taken for this visit.   Wt Readings from Last 3 Encounters:  05/02/24 174 lb  (78.9 kg)  01/25/24 195 lb 8.8 oz (88.7 kg)  11/26/23 183 lb (83 kg)     GEN: Well nourished, well developed in no acute distress NECK: No JVD; No carotid bruits CARDIAC: ***RR, *** murmurs, rubs, gallops RESPIRATORY:  Clear to auscultation without rales, wheezing or rhonchi  ABDOMEN: Soft, non-tender, non-distended EXTREMITIES:  No edema; No deformity   ASSESSMENT AND PLAN:   HTN:   ***     Her blood pressure is mildly elevated.  She has not yet restarted her beta-blocker which is going to start.    PULMONARY EMBOLI:    ***  She now has another indication to take her blood thinner.  I told both her and her daughter to set their phones to alarm to the morning dose.  They take other twice daily drugs.  They think they can comply with twice daily dosing rather than switching to Xarelto.    AORTIC ROOT ENLARGEMENT:   ***   his was 40 mm.  I will follow this conservatively.   CHEST PAIN:    ***   he has had no objective evidence of ischemia.  She had normal coronary arteries a few years ago on cath.  No further cardiac workup.   PAF:   ***   She is not having PAF.  She is already on anticoagulation.  She is going to restart the beta-blocker.  At this point I am not planning meds for rhythm control unless she has recurrent symptomatic fibrillation.  I think she would be a poor candidate for ablation.  Follow up ***  Signed, Lynwood Schilling, MD   "

## 2024-08-01 ENCOUNTER — Ambulatory Visit: Admitting: Cardiology

## 2024-08-01 DIAGNOSIS — I2699 Other pulmonary embolism without acute cor pulmonale: Secondary | ICD-10-CM

## 2024-08-01 DIAGNOSIS — I480 Paroxysmal atrial fibrillation: Secondary | ICD-10-CM

## 2024-08-01 DIAGNOSIS — I10 Essential (primary) hypertension: Secondary | ICD-10-CM
